# Patient Record
Sex: Male | Born: 1945 | Race: White | Hispanic: No | Marital: Married | Smoking: Former smoker
Health system: Southern US, Community
[De-identification: ages and names within clinical notes are randomized; demographics above are authoritative.]

## PROBLEM LIST (undated history)

## (undated) DIAGNOSIS — I10 Essential (primary) hypertension: Secondary | ICD-10-CM

## (undated) DIAGNOSIS — Z77098 Contact with and (suspected) exposure to other hazardous, chiefly nonmedicinal, chemicals: Secondary | ICD-10-CM

## (undated) DIAGNOSIS — K219 Gastro-esophageal reflux disease without esophagitis: Secondary | ICD-10-CM

## (undated) DIAGNOSIS — I82409 Acute embolism and thrombosis of unspecified deep veins of unspecified lower extremity: Secondary | ICD-10-CM

## (undated) DIAGNOSIS — I2510 Atherosclerotic heart disease of native coronary artery without angina pectoris: Secondary | ICD-10-CM

## (undated) DIAGNOSIS — Z860100 Personal history of colon polyps, unspecified: Secondary | ICD-10-CM

## (undated) DIAGNOSIS — K5720 Diverticulitis of large intestine with perforation and abscess without bleeding: Secondary | ICD-10-CM

## (undated) DIAGNOSIS — IMO0002 Reserved for concepts with insufficient information to code with codable children: Secondary | ICD-10-CM

## (undated) DIAGNOSIS — G4730 Sleep apnea, unspecified: Secondary | ICD-10-CM

## (undated) DIAGNOSIS — H409 Unspecified glaucoma: Secondary | ICD-10-CM

## (undated) DIAGNOSIS — M1990 Unspecified osteoarthritis, unspecified site: Secondary | ICD-10-CM

## (undated) DIAGNOSIS — S83232A Complex tear of medial meniscus, current injury, left knee, initial encounter: Secondary | ICD-10-CM

## (undated) DIAGNOSIS — I219 Acute myocardial infarction, unspecified: Secondary | ICD-10-CM

## (undated) DIAGNOSIS — K651 Peritoneal abscess: Secondary | ICD-10-CM

## (undated) DIAGNOSIS — E785 Hyperlipidemia, unspecified: Secondary | ICD-10-CM

## (undated) HISTORY — DX: Essential (primary) hypertension: I10

## (undated) HISTORY — DX: Atherosclerotic heart disease of native coronary artery without angina pectoris: I25.10

## (undated) HISTORY — DX: Unspecified glaucoma: H40.9

## (undated) HISTORY — DX: Acute embolism and thrombosis of unspecified deep veins of unspecified lower extremity: I82.409

## (undated) HISTORY — DX: Hyperlipidemia, unspecified: E78.5

## (undated) HISTORY — DX: Peritoneal abscess: K65.1

## (undated) HISTORY — DX: Acute myocardial infarction, unspecified: I21.9

## (undated) HISTORY — DX: Contact with and (suspected) exposure to other hazardous, chiefly nonmedicinal, chemicals: Z77.098

## (undated) HISTORY — DX: Personal history of colonic polyps: Z86.010

## (undated) HISTORY — DX: Personal history of colon polyps, unspecified: Z86.0100

## (undated) HISTORY — DX: Reserved for concepts with insufficient information to code with codable children: IMO0002

## (undated) HISTORY — DX: Diverticulitis of large intestine with perforation and abscess without bleeding: K57.20

## (undated) HISTORY — DX: Gastro-esophageal reflux disease without esophagitis: K21.9

## (undated) HISTORY — PX: PERCUTANEOUS CORONARY STENT INTERVENTION (PCI-S): SHX6016

---
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
## 1966-10-23 DIAGNOSIS — Z77098 Contact with and (suspected) exposure to other hazardous, chiefly nonmedicinal, chemicals: Secondary | ICD-10-CM

## 1966-10-23 HISTORY — DX: Contact with and (suspected) exposure to other hazardous, chiefly nonmedicinal, chemicals: Z77.098

## 1998-12-03 ENCOUNTER — Ambulatory Visit (HOSPITAL_COMMUNITY): Admission: RE | Admit: 1998-12-03 | Discharge: 1998-12-04 | Payer: Self-pay | Admitting: Cardiovascular Disease

## 1998-12-03 ENCOUNTER — Encounter: Payer: Self-pay | Admitting: Cardiovascular Disease

## 2002-11-01 ENCOUNTER — Ambulatory Visit (HOSPITAL_BASED_OUTPATIENT_CLINIC_OR_DEPARTMENT_OTHER): Admission: RE | Admit: 2002-11-01 | Discharge: 2002-11-01 | Payer: Self-pay | Admitting: Internal Medicine

## 2004-10-23 DIAGNOSIS — H409 Unspecified glaucoma: Secondary | ICD-10-CM

## 2004-10-23 HISTORY — DX: Unspecified glaucoma: H40.9

## 2007-05-07 DIAGNOSIS — G4733 Obstructive sleep apnea (adult) (pediatric): Secondary | ICD-10-CM | POA: Insufficient documentation

## 2007-05-07 DIAGNOSIS — Z9989 Dependence on other enabling machines and devices: Secondary | ICD-10-CM

## 2007-05-07 DIAGNOSIS — I2510 Atherosclerotic heart disease of native coronary artery without angina pectoris: Secondary | ICD-10-CM | POA: Insufficient documentation

## 2007-05-17 ENCOUNTER — Ambulatory Visit: Payer: Self-pay | Admitting: Internal Medicine

## 2007-05-17 LAB — CONVERTED CEMR LAB
ALT: 44 units/L (ref 0–53)
AST: 32 units/L (ref 0–37)
Albumin: 3.7 g/dL (ref 3.5–5.2)
Alkaline Phosphatase: 67 units/L (ref 39–117)
BUN: 15 mg/dL (ref 6–23)
Basophils Absolute: 0 10*3/uL (ref 0.0–0.1)
Basophils Relative: 0.6 % (ref 0.0–1.0)
Bilirubin Urine: NEGATIVE
Bilirubin, Direct: 0.1 mg/dL (ref 0.0–0.3)
Blood in Urine, dipstick: NEGATIVE
CO2: 29 meq/L (ref 19–32)
Calcium: 9.3 mg/dL (ref 8.4–10.5)
Chloride: 107 meq/L (ref 96–112)
Cholesterol: 126 mg/dL (ref 0–200)
Creatinine, Ser: 1 mg/dL (ref 0.4–1.5)
Direct LDL: 55.4 mg/dL
Eosinophils Absolute: 0.4 10*3/uL (ref 0.0–0.6)
Eosinophils Relative: 5.5 % — ABNORMAL HIGH (ref 0.0–5.0)
GFR calc Af Amer: 98 mL/min
GFR calc non Af Amer: 81 mL/min
Glucose, Bld: 145 mg/dL — ABNORMAL HIGH (ref 70–99)
Glucose, Urine, Semiquant: NEGATIVE
HCT: 38.3 % — ABNORMAL LOW (ref 39.0–52.0)
HDL: 20.1 mg/dL — ABNORMAL LOW (ref >39.0)
Hemoglobin: 13.6 g/dL (ref 13.0–17.0)
Ketones, urine, test strip: NEGATIVE
Lymphocytes Relative: 24.8 % (ref 12.0–46.0)
MCHC: 35.4 g/dL (ref 30.0–36.0)
MCV: 92.5 fL (ref 78.0–100.0)
Monocytes Absolute: 0.5 10*3/uL (ref 0.2–0.7)
Monocytes Relative: 8.4 % (ref 3.0–11.0)
Neutro Abs: 3.9 10*3/uL (ref 1.4–7.7)
Neutrophils Relative %: 60.7 % (ref 43.0–77.0)
Nitrite: NEGATIVE
PSA: 0.53 ng/mL (ref 0.10–4.00)
Platelets: 163 10*3/uL (ref 150–400)
Potassium: 4 meq/L (ref 3.5–5.1)
Protein, U semiquant: NEGATIVE
RBC: 4.14 M/uL — ABNORMAL LOW (ref 4.22–5.81)
RDW: 12.5 % (ref 11.5–14.6)
Sodium: 141 meq/L (ref 135–145)
Specific Gravity, Urine: 1.02
TSH: 0.95 microintl units/mL (ref 0.35–5.50)
Total Bilirubin: 0.6 mg/dL (ref 0.3–1.2)
Total CHOL/HDL Ratio: 6.3
Total Protein: 6.3 g/dL (ref 6.0–8.3)
Triglycerides: 241 mg/dL (ref 0–149)
Urobilinogen, UA: 0.2
VLDL: 48 mg/dL — ABNORMAL HIGH (ref 0–40)
WBC Urine, dipstick: NEGATIVE
WBC: 6.4 10*3/uL (ref 4.5–10.5)
pH: 7

## 2007-07-05 ENCOUNTER — Ambulatory Visit: Payer: Self-pay | Admitting: Internal Medicine

## 2007-07-05 DIAGNOSIS — M25559 Pain in unspecified hip: Secondary | ICD-10-CM | POA: Insufficient documentation

## 2007-07-05 DIAGNOSIS — E118 Type 2 diabetes mellitus with unspecified complications: Secondary | ICD-10-CM

## 2007-07-05 DIAGNOSIS — E1169 Type 2 diabetes mellitus with other specified complication: Secondary | ICD-10-CM | POA: Insufficient documentation

## 2007-07-05 DIAGNOSIS — E1165 Type 2 diabetes mellitus with hyperglycemia: Secondary | ICD-10-CM

## 2007-07-08 ENCOUNTER — Ambulatory Visit: Payer: Self-pay | Admitting: Internal Medicine

## 2007-07-16 ENCOUNTER — Telehealth (INDEPENDENT_AMBULATORY_CARE_PROVIDER_SITE_OTHER): Payer: Self-pay | Admitting: *Deleted

## 2007-07-18 DIAGNOSIS — IMO0002 Reserved for concepts with insufficient information to code with codable children: Secondary | ICD-10-CM

## 2007-07-18 HISTORY — DX: Reserved for concepts with insufficient information to code with codable children: IMO0002

## 2007-07-19 ENCOUNTER — Ambulatory Visit: Payer: Self-pay | Admitting: Internal Medicine

## 2007-07-24 ENCOUNTER — Encounter: Payer: Self-pay | Admitting: Internal Medicine

## 2007-07-30 ENCOUNTER — Telehealth: Payer: Self-pay | Admitting: *Deleted

## 2007-08-09 ENCOUNTER — Encounter: Payer: Self-pay | Admitting: Internal Medicine

## 2007-08-13 ENCOUNTER — Telehealth: Payer: Self-pay | Admitting: *Deleted

## 2007-08-14 ENCOUNTER — Telehealth (INDEPENDENT_AMBULATORY_CARE_PROVIDER_SITE_OTHER): Payer: Self-pay | Admitting: *Deleted

## 2007-09-04 ENCOUNTER — Ambulatory Visit: Payer: Self-pay | Admitting: Internal Medicine

## 2007-09-17 ENCOUNTER — Telehealth: Payer: Self-pay | Admitting: Internal Medicine

## 2007-12-27 ENCOUNTER — Ambulatory Visit: Payer: Self-pay | Admitting: Internal Medicine

## 2007-12-27 LAB — CONVERTED CEMR LAB
CO2: 29 meq/L (ref 19–32)
Calcium: 9.8 mg/dL (ref 8.4–10.5)
Chloride: 108 meq/L (ref 96–112)
Creatinine, Ser: 1 mg/dL (ref 0.4–1.5)
Glucose, Bld: 86 mg/dL (ref 70–99)
Hgb A1c MFr Bld: 5.9 % (ref 4.6–6.0)
Microalb Creat Ratio: 4.6 mg/g (ref 0.0–30.0)
Microalb, Ur: 0.2 mg/dL (ref 0.0–1.9)

## 2008-04-17 ENCOUNTER — Ambulatory Visit: Payer: Self-pay | Admitting: Internal Medicine

## 2008-04-17 LAB — CONVERTED CEMR LAB
CO2: 30 meq/L (ref 19–32)
Calcium: 9.8 mg/dL (ref 8.4–10.5)
Chloride: 106 meq/L (ref 96–112)
Creatinine, Ser: 1.1 mg/dL (ref 0.4–1.5)
Glucose, Bld: 124 mg/dL — ABNORMAL HIGH (ref 70–99)
HDL: 32.1 mg/dL — ABNORMAL LOW (ref >39.0)
PSA: 0.46 ng/mL (ref 0.10–4.00)
Triglycerides: 130 mg/dL (ref 0–149)

## 2008-05-01 ENCOUNTER — Encounter: Payer: Self-pay | Admitting: Internal Medicine

## 2008-05-01 ENCOUNTER — Ambulatory Visit: Payer: Self-pay

## 2008-06-15 ENCOUNTER — Telehealth (INDEPENDENT_AMBULATORY_CARE_PROVIDER_SITE_OTHER): Payer: Self-pay | Admitting: *Deleted

## 2008-08-10 ENCOUNTER — Inpatient Hospital Stay (HOSPITAL_COMMUNITY): Admission: EM | Admit: 2008-08-10 | Discharge: 2008-08-12 | Payer: Self-pay | Admitting: Emergency Medicine

## 2008-08-11 HISTORY — PX: CARDIAC CATHETERIZATION: SHX172

## 2008-08-20 HISTORY — PX: DOPPLER ECHOCARDIOGRAPHY: SHX263

## 2008-09-07 ENCOUNTER — Encounter: Payer: Self-pay | Admitting: Internal Medicine

## 2008-10-02 ENCOUNTER — Ambulatory Visit: Payer: Self-pay | Admitting: Internal Medicine

## 2008-12-16 ENCOUNTER — Encounter: Payer: Self-pay | Admitting: Internal Medicine

## 2009-01-01 ENCOUNTER — Ambulatory Visit: Payer: Self-pay | Admitting: Internal Medicine

## 2009-01-01 LAB — CONVERTED CEMR LAB
Cholesterol: 159 mg/dL (ref 0–200)
Direct LDL: 79.3 mg/dL
HDL: 28.1 mg/dL — ABNORMAL LOW (ref >39.0)

## 2009-01-12 ENCOUNTER — Encounter: Payer: Self-pay | Admitting: Internal Medicine

## 2009-05-02 ENCOUNTER — Emergency Department (HOSPITAL_COMMUNITY): Admission: EM | Admit: 2009-05-02 | Discharge: 2009-05-02 | Payer: Self-pay | Admitting: Family Medicine

## 2009-07-09 ENCOUNTER — Ambulatory Visit: Payer: Self-pay | Admitting: Internal Medicine

## 2009-07-21 LAB — CONVERTED CEMR LAB
BUN: 16 mg/dL (ref 6–23)
CO2: 28 meq/L (ref 19–32)
Calcium: 9.6 mg/dL (ref 8.4–10.5)
Chloride: 109 meq/L (ref 96–112)
Cholesterol: 152 mg/dL (ref 0–200)
Creatinine, Ser: 1 mg/dL (ref 0.4–1.5)
Direct LDL: 70 mg/dL
GFR calc non Af Amer: 79.98 mL/min (ref >60)
Glucose, Bld: 138 mg/dL — ABNORMAL HIGH (ref 70–99)
HDL: 30.4 mg/dL — ABNORMAL LOW (ref >39.00)
Hgb A1c MFr Bld: 6.2 % (ref 4.6–6.5)
Potassium: 4.4 meq/L (ref 3.5–5.1)
Sodium: 144 meq/L (ref 135–145)

## 2009-08-24 LAB — HM DIABETES FOOT EXAM

## 2009-09-24 ENCOUNTER — Encounter: Payer: Self-pay | Admitting: Internal Medicine

## 2009-09-29 ENCOUNTER — Encounter (INDEPENDENT_AMBULATORY_CARE_PROVIDER_SITE_OTHER): Payer: Self-pay | Admitting: *Deleted

## 2009-10-08 ENCOUNTER — Ambulatory Visit: Payer: Self-pay | Admitting: Internal Medicine

## 2009-10-08 DIAGNOSIS — L299 Pruritus, unspecified: Secondary | ICD-10-CM | POA: Insufficient documentation

## 2009-10-08 LAB — CONVERTED CEMR LAB
BUN: 10 mg/dL (ref 6–23)
CO2: 30 meq/L (ref 19–32)
Calcium: 9.5 mg/dL (ref 8.4–10.5)
Chloride: 105 meq/L (ref 96–112)
Creatinine, Ser: 1.1 mg/dL (ref 0.4–1.5)
Glucose, Bld: 136 mg/dL — ABNORMAL HIGH (ref 70–99)

## 2010-02-23 ENCOUNTER — Ambulatory Visit: Payer: Self-pay | Admitting: Internal Medicine

## 2010-02-23 LAB — CONVERTED CEMR LAB
AST: 30 units/L (ref 0–37)
BUN: 11 mg/dL (ref 6–23)
Basophils Absolute: 0.1 10*3/uL (ref 0.0–0.1)
CO2: 29 meq/L (ref 19–32)
Calcium: 9.4 mg/dL (ref 8.4–10.5)
Cholesterol: 139 mg/dL (ref 0–200)
Creatinine, Ser: 0.9 mg/dL (ref 0.4–1.5)
GFR calc non Af Amer: 85.72 mL/min (ref >60)
Glucose, Bld: 146 mg/dL — ABNORMAL HIGH (ref 70–99)
HCT: 40.4 % (ref 39.0–52.0)
Hemoglobin: 14 g/dL (ref 13.0–17.0)
Lymphs Abs: 1.7 10*3/uL (ref 0.7–4.0)
MCHC: 34.7 g/dL (ref 30.0–36.0)
MCV: 96.1 fL (ref 78.0–100.0)
Monocytes Absolute: 0.2 10*3/uL (ref 0.1–1.0)
Monocytes Relative: 2.9 % — ABNORMAL LOW (ref 3.0–12.0)
Neutro Abs: 6.4 10*3/uL (ref 1.4–7.7)
Platelets: 132 10*3/uL — ABNORMAL LOW (ref 150.0–400.0)
RDW: 12.9 % (ref 11.5–14.6)
Sodium: 141 meq/L (ref 135–145)
Total Bilirubin: 0.5 mg/dL (ref 0.3–1.2)
Total Protein: 7.4 g/dL (ref 6.0–8.3)

## 2010-04-29 ENCOUNTER — Encounter: Payer: Self-pay | Admitting: Internal Medicine

## 2010-06-24 ENCOUNTER — Ambulatory Visit: Payer: Self-pay | Admitting: Internal Medicine

## 2010-06-24 DIAGNOSIS — L2089 Other atopic dermatitis: Secondary | ICD-10-CM | POA: Insufficient documentation

## 2010-06-24 LAB — CONVERTED CEMR LAB
Cholesterol: 129 mg/dL
HDL: 35 mg/dL
LDL Cholesterol: 54 mg/dL

## 2010-07-11 ENCOUNTER — Encounter: Payer: Self-pay | Admitting: Internal Medicine

## 2010-07-29 ENCOUNTER — Ambulatory Visit: Payer: Self-pay | Admitting: Internal Medicine

## 2010-11-20 LAB — CONVERTED CEMR LAB: Hgb A1c MFr Bld: 6.6 % — ABNORMAL HIGH (ref 4.6–6.0)

## 2010-11-24 NOTE — Assessment & Plan Note (Signed)
Summary: follow up/med check/diabetes meds/cjr   Vital Signs:  Patient profile:   65 year old male Height:      69 inches Weight:      246 pounds BMI:     36.46 Temp:     98.6 degrees F oral Pulse rate:   72 / minute Resp:     14 per minute BP sitting:   124 / 80  (left arm)  Vitals Entered By: Willy Eddy, LPN (Feb 23, 1609 3:52 PM) CC: roa, Hypertension Management   CC:  roa and Hypertension Management.  History of Present Illness: The A1C  has been stable and the pt has been folow the appropriate diet still smoking pt and wife both still smoke   Hypertension History:      He denies headache, chest pain, palpitations, dyspnea with exertion, orthopnea, PND, peripheral edema, visual symptoms, neurologic problems, syncope, and side effects from treatment.        Positive major cardiovascular risk factors include male age 63 years old or older, diabetes, hyperlipidemia, hypertension, and current tobacco user.        Positive history for target organ damage include ASHD (either angina/prior MI/prior CABG).     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) 3)  Lumbar Radiculopathy  (ICD-724.4) 4)  Preventive Health Care  (ICD-V70.0) 5)  Hip Pain, Bilateral  (ICD-719.45) 6)  Hyperlipidemia  (ICD-272.4) 7)  Diabetes Mellitus, Type II  (ICD-250.00) 8)  Sleep Apnea, Chronic  (ICD-780.57) 9)  Myocardial Infarction, Hx of  (ICD-412) 10)  Hypertension  (ICD-401.9) 11)  Coronary Artery Disease  (ICD-414.00)  Current Problems (verified): 1)  Pruritus  (ICD-698.9) 2)  Hyperlipidemia, 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 Once Daily 2)  Simvastatin 40 Mg  Tabs (Simvastatin) .... Once Daily 3)  Adult Aspirin Low Strength 81 Mg  Tbdp (Aspirin) .... Once Daily 4)  C-Pap Machine 5)  Janumet 50-500 Mg  Tabs (Sitagliptin-Metformin Hcl) .... One By Mouth Daily 6)  Freestyle Lite   Strp (Glucose Blood) .... Use Tobg's As Direced  ( Two Times A Day) 7)  Plavix 75 Mg Tabs (Clopidogrel Bisulfate) .Marland Kitchen.. 1 Once Daily 8)  Lisinopril 10 Mg Tabs (Lisinopril) .Marland Kitchen.. 1 Once Daily 9)  Brimonidine Tartrate 0.2 % Soln (Brimonidine Tartrate) .... Two Times A Day 10)  Rid Lice Killing Mousse 4-0.33 % Foam (Pyrethrins-Piperonyl Butoxide) .... Aplly As Directed Overnight 11)  Fexofenadine Hcl 180 Mg Tabs (Fexofenadine Hcl) .... One By Mouth Daily Prn 12)  Betamethasone Valerate 0.1 % Lotn (Betamethasone Valerate) .... Mix Betamethasone and Sarna Lotions 1 Part Betamethasone To 3 Parts Sarna Lotion  and Apply To Skin Two Times A Day As Needed  Current Medications (verified): 1)  Atenolol 50 Mg  Tabs (Atenolol) .... Once Daily 2)  Simvastatin 40 Mg  Tabs (Simvastatin) .... Once Daily 3)  Adult Aspirin Low Strength 81 Mg  Tbdp (Aspirin) .Marland KitchenMarland KitchenMarland Kitchen  Once Daily 4)  C-Pap Machine 5)  Janumet 50-500 Mg  Tabs (Sitagliptin-Metformin Hcl) .... One By Mouth Daily 6)  Freestyle Lite   Strp (Glucose Blood) .... Use Tobg's As Direced  ( Two Times A Day) 7)  Plavix 75 Mg Tabs (Clopidogrel Bisulfate) .Marland Kitchen.. 1 Once Daily 8)  Lisinopril 10 Mg Tabs (Lisinopril) .Marland Kitchen.. 1 Once Daily 9)  Fexofenadine Hcl 180 Mg Tabs (Fexofenadine Hcl) .... One By Mouth Daily Prn 10)  Betamethasone Valerate 0.1 % Lotn (Betamethasone Valerate) .... Mix Betamethasone and Sarna Lotions 1 Part Betamethasone To 3 Parts Sarna Lotion  and Apply To Skin Two Times A Day As  Needed  Allergies (verified): No Known Drug Allergies  Past History:  Family History: Last updated: 07/19/2007 Family History Hypertension  Social History: Last updated: 09/04/2007 Married Former Smoker Alcohol use-yes Drug use-no Regular exercise-yes  Risk Factors: Exercise: yes (09/04/2007)  Risk Factors: Smoking Status: current (02/23/2010) Packs/Day: 1.0 (02/23/2010)  Past medical, surgical, family and social histories (including risk factors) reviewed, and no changes noted (except as noted below).  Past Medical History: Reviewed history from 07/05/2007 and no changes required. Coronary artery disease Hypertension Myocardial infarction, hx of Diabetes mellitus, type II Hyperlipidemia  Past Surgical History: Reviewed history from 07/19/2007 and no changes required. stents x two for one vessle dz Tresa Endo) Appendectomy  Family History: Reviewed history from 07/19/2007 and no changes required. Family History Hypertension  Social History: Reviewed history from 09/04/2007 and no changes required. Married Former Smoker Alcohol use-yes Drug use-no Regular exercise-yes  Review of Systems  The patient denies anorexia, fever, weight loss, weight gain, vision loss, decreased hearing, hoarseness, chest pain, syncope, dyspnea on exertion, peripheral edema, prolonged cough, headaches, hemoptysis, abdominal pain, melena, hematochezia, severe indigestion/heartburn, hematuria, incontinence, genital sores, muscle weakness, suspicious skin lesions, transient blindness, difficulty walking, depression, unusual weight change, abnormal bleeding, enlarged lymph nodes, angioedema, and breast masses.    Physical Exam  General:  Well-developed,well-nourished,in no acute distress; alert,appropriate and cooperative throughout examination Head:  normocephalic and male-pattern balding.   Nose:  no external deformity and no nasal discharge.   Neck:  No deformities, masses, or  tenderness noted. Lungs:  normal respiratory effort and no wheezes.   Heart:  normal rate and regular rhythm.   Abdomen:  Bowel sounds positive,abdomen soft and non-tender without masses, organomegaly or hernias noted. Msk:  No deformity or scoliosis noted of thoracic or lumbar spine.   Extremities:  No clubbing, cyanosis, edema, or deformity noted with normal full range of motion of all joints.   Neurologic:  No cranial nerve deficits noted. Station and gait are normal. Plantar reflexes are down-going bilaterally. DTRs are symmetrical throughout. Sensory, motor and coordinative functions appear intact.   Impression & Recommendations:  Problem # 1:  HYPERLIPIDEMIA, ATHEROGENIC (ICD-272.4) Assessment Unchanged  His updated medication list for this problem includes:    Simvastatin 40 Mg Tabs (Simvastatin) ..... Once daily  Labs Reviewed: SGOT: 32 (05/17/2007)   SGPT: 44 (05/17/2007)  Lipid Goals: Chol Goal: 200 (12/27/2007)   HDL Goal: 40 (12/27/2007)   LDL Goal: 70 (12/27/2007)   TG Goal: 150 (12/27/2007)  Prior 10 Yr Risk Heart Disease: N/A (12/27/2007)   HDL:30.40 (07/09/2009), 28.1 (01/01/2009)  LDL:75 (04/17/2008), DEL (47/82/9562)  Chol:152 (07/09/2009), 159 (01/01/2009)  Trig:130 (04/17/2008), 241 (05/17/2007)  Orders: TLB-Cholesterol, HDL (83718-HDL) TLB-Cholesterol, Direct LDL (83721-DIRLDL) TLB-Cholesterol, Total (82465-CHO)  Problem # 2:  HYPERTENSION (ICD-401.9) Assessment: Unchanged  His updated medication list for this problem includes:  Atenolol 50 Mg Tabs (Atenolol) ..... Once daily    Lisinopril 10 Mg Tabs (Lisinopril) .Marland Kitchen... 1 once daily  Orders: Venipuncture (16109) TLB-BMP (Basic Metabolic Panel-BMET) (80048-METABOL)  BP today: 124/80 Prior BP: 100/68 (10/08/2009)  Prior 10 Yr Risk Heart Disease: N/A (12/27/2007)  Labs Reviewed: K+: 4.4 (10/08/2009) Creat: : 1.1 (10/08/2009)   Chol: 152 (07/09/2009)   HDL: 30.40 (07/09/2009)   LDL: 75 (04/17/2008)    TG: 130 (04/17/2008)  Problem # 3:  CORONARY ARTERY DISEASE (ICD-414.00)  His updated medication list for this problem includes:    Atenolol 50 Mg Tabs (Atenolol) ..... Once daily    Adult Aspirin Low Strength 81 Mg Tbdp (Aspirin) ..... Once daily    Plavix 75 Mg Tabs (Clopidogrel bisulfate) .Marland Kitchen... 1 once daily    Lisinopril 10 Mg Tabs (Lisinopril) .Marland Kitchen... 1 once daily  Orders: TLB-CBC Platelet - w/Differential (85025-CBCD)  Labs Reviewed: Chol: 152 (07/09/2009)   HDL: 30.40 (07/09/2009)   LDL: 75 (04/17/2008)   TG: 130 (04/17/2008)  Lipid Goals: Chol Goal: 200 (12/27/2007)   HDL Goal: 40 (12/27/2007)   LDL Goal: 70 (12/27/2007)   TG Goal: 150 (12/27/2007)  Problem # 4:  PRURITUS (ICD-698.9)  Orders: TLB-Hepatic/Liver Function Pnl (80076-HEPATIC)  Complete Medication List: 1)  Atenolol 50 Mg Tabs (Atenolol) .... Once daily 2)  Simvastatin 40 Mg Tabs (Simvastatin) .... Once daily 3)  Adult Aspirin Low Strength 81 Mg Tbdp (Aspirin) .... Once daily 4)  C-pap Machine  5)  Janumet 50-500 Mg Tabs (Sitagliptin-metformin hcl) .... One by mouth daily 6)  Freestyle Lite Strp (Glucose blood) .... Use tobg's as direced  ( two times a day) 7)  Plavix 75 Mg Tabs (Clopidogrel bisulfate) .Marland Kitchen.. 1 once daily 8)  Lisinopril 10 Mg Tabs (Lisinopril) .Marland Kitchen.. 1 once daily 9)  Fexofenadine Hcl 180 Mg Tabs (Fexofenadine hcl) .... One by mouth daily prn 10)  Betamethasone Valerate 0.1 % Lotn (Betamethasone valerate) .... Mix betamethasone and sarna lotions 1 part betamethasone to 3 parts sarna lotion  and apply to skin two times a day as needed 11)  Clobetasol Propionate 0.05 % Foam (Clobetasol propionate) .... Aplly to site daily  Hypertension Assessment/Plan:      The patient's hypertensive risk group is category C: Target organ damage and/or diabetes.  Today's blood pressure is 124/80.  His blood pressure goal is < 130/80.  Patient Instructions: 1)  Please schedule a follow-up appointment in 3  months. Prescriptions: CLOBETASOL PROPIONATE 0.05 % FOAM (CLOBETASOL PROPIONATE) aplly to site daily  #1 can x 3   Entered and Authorized by:   Stacie Glaze MD   Signed by:   Stacie Glaze MD on 02/23/2010   Method used:   Electronically to        [facility] (retail)       [address]       [place], [place]  [address]       Ph: [phone] or [phone]       Fax: [phone]   RxID:   [id number]

## 2010-11-24 NOTE — Assessment & Plan Note (Signed)
Summary: 3 mo rov/mm rsc bmp/njr   Vital Signs:  Patient profile:   65 year old male Height:      69 inches Weight:      245 pounds BMI:     36.31 Temp:     98.2 degrees F oral Pulse rate:   68 / minute Resp:     14 per minute BP sitting:   124 / 76  (left arm)  Vitals Entered By: Willy Eddy, LPN (June 24, 2010 9:10 AM) CC: roa, Hypertension Management Is Patient Diabetic? Yes Did you bring your meter with you today? No   Primary Care Provider:  Stacie Glaze MD  CC:  roa and Hypertension Management.  History of Present Illness: The lesion are in the waste area with bx of allergic reaction and possible scabies wife treated for scabes  as well  blood work from [facility] included lipids? CBC and BMET  the A1C was 6.3 we will stop the lisinopril to see if this is a medication reaction   Hypertension History:      He denies headache, chest pain, palpitations, dyspnea with exertion, orthopnea, PND, peripheral edema, visual symptoms, neurologic problems, syncope, and side effects from treatment.        Positive major cardiovascular risk factors include male age 66 years old or older, diabetes, hyperlipidemia, hypertension, and current tobacco user.        Positive history for target organ damage include ASHD (either angina/prior MI/prior CABG).     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) 4)  Lumbar Radiculopathy  (ICD-724.4) 5)  Preventive Health Care  (ICD-V70.0) 6)  Hip Pain, Bilateral  (ICD-719.45) 7)  Hyperlipidemia  (ICD-272.4) 8)  Diabetes Mellitus, Type II  (ICD-250.00) 9)  Sleep Apnea, Chronic  (ICD-780.57) 10)  Myocardial Infarction, Hx of  (ICD-412) 11)  Hypertension  (ICD-401.9) 12)  Coronary Artery Disease  (ICD-414.00)  Current Problems  (verified): 1)  Pruritus  (ICD-698.9) 2)  Pruritus  (ICD-698.9) 3)  Hyperlipidemia, 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 Once Daily 2)  Simvastatin 40 Mg  Tabs (Simvastatin) .... Once Daily 3)  Adult Aspirin Low Strength 81 Mg  Tbdp (Aspirin) .... Once Daily 4)  C-Pap Machine 5)  Janumet 50-500 Mg  Tabs (Sitagliptin-Metformin Hcl) .... One By Mouth Daily 6)  Freestyle Lite   Strp (Glucose Blood) .... Use Tobg's As Direced  ( Two Times A Day) 7)  Plavix 75 Mg Tabs (Clopidogrel Bisulfate) .Marland Kitchen.. 1 Once Daily 8)  Lisinopril 10 Mg Tabs (Lisinopril) .Marland Kitchen.. 1 Once Daily 9)  Fexofenadine Hcl 180 Mg Tabs (Fexofenadine Hcl) .... One By Mouth Daily Prn 10)  Betamethasone Valerate 0.1 % Lotn (Betamethasone Valerate) .... Mix Betamethasone and Sarna Lotions 1 Part Betamethasone To 3 Parts Sarna Lotion  and Apply To Skin Two Times A Day As Needed 11)  Clobetasol Propionate 0.05 % Foam (Clobetasol  Propionate) .... Aplly To Site Daily  Current Medications (verified): 1)  Atenolol 50 Mg  Tabs (Atenolol) .... Once Daily 2)  Simvastatin 40 Mg  Tabs (Simvastatin) .... Once Daily 3)  Adult Aspirin Low Strength 81 Mg  Tbdp (Aspirin) .... Once Daily 4)  C-Pap Machine 5)  Janumet 50-500 Mg  Tabs (Sitagliptin-Metformin Hcl) .... One By Mouth Daily 6)  Freestyle Lite   Strp (Glucose Blood) .... Use Tobg's As Direced  ( Two Times A Day) 7)  Plavix 75 Mg Tabs (Clopidogrel Bisulfate) .Marland Kitchen.. 1 Once Daily 8)  Fexofenadine Hcl 180 Mg Tabs (Fexofenadine Hcl) .... One By Mouth Daily Prn 9)  Methylprednisolone 4 Mg  Tabs (Methylprednisolone) .... One By Mouth Daily  Allergies (verified): No Known Drug Allergies  Past History:  Family History: Last updated: 07/19/2007 Family History Hypertension  Social History: Last updated: 09/04/2007 Married Former Smoker Alcohol use-yes Drug use-no Regular exercise-yes  Risk Factors: Exercise: yes (09/04/2007)  Risk Factors: Smoking Status: current (06/24/2010) Packs/Day: 1.0 (06/24/2010)  Past medical, surgical, family and social histories (including risk factors) reviewed, and no changes noted (except as noted below).  Past Medical History: Reviewed history from 07/05/2007 and no changes required. Coronary artery disease Hypertension Myocardial infarction, hx of Diabetes mellitus, type II Hyperlipidemia  Past Surgical History: Reviewed history from 07/19/2007 and no changes required. stents x two for one vessle dz Tresa Endo) Appendectomy  Family History: Reviewed history from 07/19/2007 and no changes required. Family History Hypertension  Social History: Reviewed history from 09/04/2007 and no changes required. Married Former Smoker Alcohol use-yes Drug use-no Regular exercise-yes  Review of Systems  The patient denies anorexia, fever, weight loss, weight gain, vision loss, decreased hearing, hoarseness, chest pain, syncope, dyspnea on exertion, peripheral edema, prolonged cough, headaches, hemoptysis, abdominal pain, melena, hematochezia, severe indigestion/heartburn, hematuria, incontinence, genital sores, muscle weakness, suspicious skin lesions, transient blindness, difficulty walking, depression, unusual weight change, abnormal bleeding, enlarged lymph nodes, angioedema, and breast masses.    Physical Exam  General:  Well-developed,well-nourished,in no acute distress; alert,appropriate and cooperative throughout examination Head:  normocephalic and male-pattern balding.   Nose:  no external deformity and no nasal discharge.    Neck:  No deformities, masses, or tenderness noted. Lungs:  normal respiratory effort and no wheezes.   Heart:  normal rate and regular rhythm.   Abdomen:  Bowel sounds positive,abdomen soft and non-tender without masses, organomegaly or hernias noted. Msk:  No deformity or scoliosis noted of thoracic or lumbar spine.   Extremities:  No clubbing, cyanosis, edema, or deformity noted with normal full range of motion of all joints.   Neurologic:  No cranial nerve deficits noted. Station and gait are normal. Plantar reflexes are down-going bilaterally. DTRs are symmetrical throughout. Sensory, motor and coordinative functions appear intact.   Impression & Recommendations:  Problem # 1:  DIABETES MELLITUS, TYPE II (ICD-250.00) Assessment Unchanged  The following medications were removed from the medication list:    Lisinopril 10 Mg Tabs (Lisinopril) .Marland Kitchen... 1 once daily His updated medication list for this problem includes:    Adult Aspirin Low Strength 81 Mg Tbdp (Aspirin) ..... Once daily    Janumet 50-500 Mg Tabs (Sitagliptin-metformin hcl) ..... One by mouth daily  Labs Reviewed: Creat: 0.9 (02/23/2010)     Last Eye Exam: normal (08/24/2009) Reviewed HgBA1c results: 6.3 (10/08/2009)  6.2 (07/09/2009)  Problem # 2:  PRURITUS (ICD-698.9) Assessment: Deteriorated the pt has noted new lesions ( that started 2 years ago) biopsy " allergic  reaction" did blood work and gave cortisone cream treated  for scabes treated with permethrin %%  and predisone does pack the symptoms stoped temporary  but then resumed  Problem # 3:  HYPERLIPIDEMIA, ATHEROGENIC (ICD-272.4)  His updated medication list for this problem includes:    Simvastatin 40 Mg Tabs (Simvastatin) ..... Once daily  Labs Reviewed: SGOT: 30 (02/23/2010)   SGPT: 30 (02/23/2010)  Lipid Goals: Chol Goal: 200 (12/27/2007)   HDL Goal: 40 (12/27/2007)   LDL Goal: 70 (12/27/2007)   TG Goal: 150 (12/27/2007)  Prior 10 Yr Risk  Heart Disease: N/A (12/27/2007)   HDL:37.20 (02/23/2010), 30.40 (07/09/2009)  LDL:75 (04/17/2008), DEL (04/54/0981)  Chol:139 (02/23/2010), 152 (07/09/2009)  Trig:130 (04/17/2008), 241 (05/17/2007)  Problem # 4:  DIABETES MELLITUS, TYPE II (ICD-250.00)  The following medications were removed from the medication list:    Lisinopril 10 Mg Tabs (Lisinopril) .Marland Kitchen... 1 once daily His updated medication list for this problem includes:    Adult Aspirin Low Strength 81 Mg Tbdp (Aspirin) ..... Once daily    Janumet 50-500 Mg Tabs (Sitagliptin-metformin hcl) ..... One by mouth daily  Complete Medication List: 1)  Atenolol 50 Mg Tabs (Atenolol) .... Once daily 2)  Simvastatin 40 Mg Tabs (Simvastatin) .... Once daily 3)  Adult Aspirin Low Strength 81 Mg Tbdp (Aspirin) .... Once daily 4)  C-pap Machine  5)  Janumet 50-500 Mg Tabs (Sitagliptin-metformin hcl) .... One by mouth daily 6)  Freestyle Lite Strp (Glucose blood) .... Use tobg's as direced  ( two times a day) 7)  Plavix 75 Mg Tabs (Clopidogrel bisulfate) .Marland Kitchen.. 1 once daily 8)  Fexofenadine Hcl 180 Mg Tabs (Fexofenadine hcl) .... One by mouth daily prn 9)  Methylprednisolone 4 Mg Tabs (Methylprednisolone) .... One by mouth daily 10)  Permethrin 5 % Crea (Permethrin) .... Apply as directed and repoeat in 7 days  Other Orders: Venipuncture ([address]147) T-Allergy Profile [facility], [place], [place], [place], [place] [phone]) Specimen Handling (62130)  Hypertension Assessment/Plan:      The patient's hypertensive risk group is category C: Target organ damage and/or diabetes.  Today's blood pressure is 124/76.  His blood pressure goal is < 130/80.  Patient Instructions: 1)  Please schedule a follow-up appointment in 1 month. 2)  moniter the blood pressure and call if the blood pressure is greater than 140/90 Prescriptions: METHYLPREDNISOLONE 4 MG TABS (METHYLPREDNISOLONE) one by mouth daily  #30 x 0   Entered and Authorized by:   Stacie Glaze MD   Signed by:   Stacie Glaze MD on 06/24/2010   Method used:   Electronically to        [facility] (retail)       [address]       [place], [place]  [address]       Ph: [phone]       Fax: [phone]   RxID:   [id number] PERMETHRIN 5 % CREA (PERMETHRIN) apply as directed and repoeat in 7 days  #1 tube x 1   Entered and Authorized by:   Stacie Glaze MD   Signed by:   Stacie Glaze MD on 06/24/2010   Method used:   Electronically to        [facility] [address] (retail)       [address]       [place]  [place], [place]  [address]       Ph: [phone]       Fax: [phone]   RxID:   [id number] METHYLPREDNISOLONE 4 MG TABS (METHYLPREDNISOLONE) one by mouth daily  #30 x 0   Entered and Authorized by:   Stacie Glaze MD   Signed by:   Stacie Glaze MD on 06/24/2010   Method used:   Print then Give to Patient   RxID:   [id number]

## 2010-11-24 NOTE — Letter (Signed)
Summary: [facility]   Imported By: Maryln Gottron 07/15/2010 15:55:49  _____________________________________________________________________  External Attachment:    Type:   Image     Comment:   External Document

## 2010-11-24 NOTE — Assessment & Plan Note (Signed)
Summary: 1 month rov/njr   Vital Signs:  Patient profile:   65 year old male Height:      69 inches Weight:      244 pounds BMI:     36.16 Temp:     98.2 degrees F oral Pulse rate:   68 / minute Resp:     14 per minute BP sitting:   130 / 74  (left arm)  Vitals Entered By: Willy Eddy, LPN (July 29, 2010 11:42 AM) CC: roa, Cough Is Patient Diabetic? Yes Did you bring your meter with you today? No   Primary Care Jannifer Fischler:  Stacie Glaze MD  CC:  roa and Cough.  History of Present Illness:  Cough      This is a 65 year old man who presents with Cough.  The patient reports productive cough and wheezing, but denies non-productive cough, pleuritic chest pain, shortness of breath, exertional dyspnea, fever, hemoptysis, and malaise.  Associated symtpoms include nasal congestion.  Ineffective prior treatments have included OTC cough medication.  Risk factors include history of allergic rhinitis.    Hyperlipidemia Follow-Up      The patient also presents for Hyperlipidemia follow-up.  The patient denies muscle aches, GI upset, abdominal pain, flushing, itching, constipation, diarrhea, and fatigue.  The patient denies the following symptoms: chest pain/pressure, exercise intolerance, dypsnea, palpitations, syncope, and pedal edema.  Compliance with medications (by patient report) has been near 100%.  Dietary compliance has been good.  The patient reports exercising occasionally.  Adjunctive measures currently used by the patient include fish oil supplements and weight reduction.    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) 4)  Lumbar Radiculopathy  (ICD-724.4) 5)  Preventive Health Care   (ICD-V70.0) 6)  Hip Pain, Bilateral  (ICD-719.45) 7)  Hyperlipidemia  (ICD-272.4) 8)  Diabetes Mellitus, Type II  (ICD-250.00) 9)  Sleep Apnea, Chronic  (ICD-780.57) 10)  Myocardial Infarction, Hx of  (ICD-412) 11)  Hypertension  (ICD-401.9) 12)  Coronary Artery Disease  (ICD-414.00)  Current Problems (verified): 1)  Other Atopic Dermatitis and Related Conditions  (ICD-691.8) 2)  Pruritus  (ICD-698.9) 3)  Hyperlipidemia, 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 Once Daily 2)  Simvastatin 40 Mg  Tabs (Simvastatin) .... Once Daily 3)  Adult Aspirin Low Strength 81 Mg  Tbdp (Aspirin) .... Once Daily 4)  C-Pap Machine 5)  Janumet 50-500 Mg  Tabs (Sitagliptin-Metformin Hcl) .... One By Mouth Daily 6)  Freestyle Lite   Strp (Glucose Blood) .... Use Tobg's As Direced  ( Two Times A Day) 7)  Plavix 75 Mg Tabs (Clopidogrel Bisulfate) .Marland Kitchen.. 1 Once Daily 8)  Fexofenadine Hcl 180 Mg Tabs (Fexofenadine Hcl) .... One By Mouth Daily Prn 9)  Methylprednisolone 4 Mg Tabs (Methylprednisolone) .... One By Mouth  Daily 10)  Permethrin 5 % Crea (Permethrin) .... Apply As Directed and Repoeat in 7 Days  Current Medications (verified): 1)  Atenolol 50 Mg  Tabs (Atenolol) .... Once Daily 2)  Simvastatin 40 Mg  Tabs (Simvastatin) .... Once Daily 3)  Adult Aspirin Low Strength 81 Mg  Tbdp (Aspirin) .... Once Daily 4)  C-Pap Machine 5)  Janumet 50-500 Mg  Tabs (Sitagliptin-Metformin Hcl) .... One By Mouth Daily 6)  Freestyle Lite   Strp (Glucose Blood) .... Use Tobg's As Direced  ( Two Times A Day) 7)   Plavix 75 Mg Tabs (Clopidogrel Bisulfate) .Marland Kitchen.. 1 Once Daily 8)  Fexofenadine Hcl 180 Mg Tabs (Fexofenadine Hcl) .... One By Mouth Daily Prn 9)  Methylprednisolone 4 Mg Tabs (Methylprednisolone) .... One By Mouth Daily 10)  Permethrin 5 % Crea (Permethrin) .... Apply As Directed and Repoeat in 7 Days 11)  Hydromet 5-1.5 Mg/37ml Syrp (Hydrocodone-Homatropine) .... Two Tsp By Mouth Two Times A Day 12)  Levaquin 500 Mg Tabs (Levofloxacin) .... One By Mouth Daily For 7 Days  Allergies (verified): No Known Drug Allergies  Past History:  Family History: Last updated: 07/19/2007 Family History Hypertension  Social History: Last updated: 09/04/2007 Married Former Smoker Alcohol use-yes Drug use-no Regular exercise-yes  Risk Factors: Exercise: yes (09/04/2007)  Risk Factors: Smoking Status: current (07/29/2010) Packs/Day: 1.0 (07/29/2010)  Past medical, surgical, family and social histories (including risk factors) reviewed, and no changes noted (except as noted below).  Past Medical History: Reviewed history from 07/05/2007 and no changes required. Coronary artery disease Hypertension Myocardial infarction, hx of Diabetes mellitus, type II Hyperlipidemia  Past Surgical History: Reviewed history from 07/19/2007 and no changes required. stents x two for one vessle dz Tresa Endo) Appendectomy  Family History: Reviewed history from 07/19/2007 and no changes required. Family History Hypertension  Social History: Reviewed history from 09/04/2007 and no changes required. Married Former Smoker Alcohol use-yes Drug use-no Regular exercise-yes  Review of Systems  The patient denies anorexia, fever, weight loss, weight gain, vision loss, decreased hearing, hoarseness, chest pain, syncope, dyspnea on exertion, peripheral edema, prolonged cough, headaches, hemoptysis, abdominal pain, melena, hematochezia, severe indigestion/heartburn, hematuria, incontinence, genital sores, muscle  weakness, suspicious skin lesions, transient blindness, difficulty walking, depression, unusual weight change, abnormal bleeding, enlarged lymph nodes, angioedema, and breast masses.    Physical Exam  General:  Well-developed,well-nourished,in no acute distress; alert,appropriate and cooperative throughout examination Head:  normocephalic and male-pattern balding.   Nose:  no external deformity and no nasal discharge.   Lungs:  normal respiratory effort and no wheezes.   Heart:  normal rate and regular rhythm.   Abdomen:  Bowel sounds positive,abdomen soft and non-tender without masses, organomegaly or hernias noted.   Impression & Recommendations:  Problem # 1:  HYPERLIPIDEMIA, ATHEROGENIC (ICD-272.4)  His updated medication list for this problem includes:    Simvastatin 40 Mg Tabs (Simvastatin) ..... Once daily  Labs Reviewed: SGOT: 30 (02/23/2010)   SGPT: 30 (02/23/2010)  Lipid Goals: Chol Goal: 200 (12/27/2007)   HDL Goal: 40 (12/27/2007)   LDL Goal: 70 (12/27/2007)   TG Goal: 150 (12/27/2007)  Prior 10 Yr Risk Heart Disease: N/A (12/27/2007)   HDL:35 (06/24/2010), 37.20 (02/23/2010)  LDL:54 (06/24/2010), 75 (04/17/2008)  Chol:129 (06/24/2010), 139 (02/23/2010)  Trig:130 (04/17/2008), 241 (05/17/2007)  Problem # 2:  DIABETES MELLITUS, TYPE II (ICD-250.00)  His updated medication list for this problem includes:    Adult Aspirin Low Strength 81 Mg Tbdp (Aspirin) ..... Once daily  Janumet 50-500 Mg Tabs (Sitagliptin-metformin hcl) ..... One by mouth daily  Labs Reviewed: Creat: 0.9 (02/23/2010)     Last Eye Exam: normal (08/24/2009) Reviewed HgBA1c results: 6.3 (06/24/2010)  6.3 (10/08/2009)  Orders: Venipuncture (16109) TLB-A1C / Hgb A1C (Glycohemoglobin) (83036-A1C)  Problem # 3:  HYPERTENSION (ICD-401.9)  His updated medication list for this problem includes:    Atenolol 50 Mg Tabs (Atenolol) ..... Once daily  BP today: 130/74 Prior BP: 124/76  (06/24/2010)  Prior 10 Yr Risk Heart Disease: N/A (12/27/2007)  Labs Reviewed: K+: 4.3 (02/23/2010) Creat: : 0.9 (02/23/2010)   Chol: 129 (06/24/2010)   HDL: 35 (06/24/2010)   LDL: 54 (06/24/2010)   TG: 130 (04/17/2008)  Problem # 4:  SLEEP APNEA, CHRONIC (ICD-780.57) Assessment: Unchanged  Problem # 5:  ACUTE BRONCHITIS (ICD-466.0)  Take antibiotics and other medications as directed. Encouraged to push clear liquids, get enough rest, and take acetaminophen as needed. To be seen in 5-7 days if no improvement, sooner if worse.  His updated medication list for this problem includes:    Hydromet 5-1.5 Mg/36ml Syrp (Hydrocodone-homatropine) .Marland Kitchen..Marland Kitchen Two tsp by mouth two times a day    Levaquin 500 Mg Tabs (Levofloxacin) ..... One by mouth daily for 7 days  Complete Medication List: 1)  Atenolol 50 Mg Tabs (Atenolol) .... Once daily 2)  Simvastatin 40 Mg Tabs (Simvastatin) .... Once daily 3)  Adult Aspirin Low Strength 81 Mg Tbdp (Aspirin) .... Once daily 4)  C-pap Machine  5)  Janumet 50-500 Mg Tabs (Sitagliptin-metformin hcl) .... One by mouth daily 6)  Freestyle Lite Strp (Glucose blood) .... Use tobg's as direced  ( two times a day) 7)  Plavix 75 Mg Tabs (Clopidogrel bisulfate) .Marland Kitchen.. 1 once daily 8)  Fexofenadine Hcl 180 Mg Tabs (Fexofenadine hcl) .... One by mouth daily prn 9)  Methylprednisolone 4 Mg Tabs (Methylprednisolone) .... One by mouth daily 10)  Permethrin 5 % Crea (Permethrin) .... Apply as directed and repoeat in 7 days 11)  Hydromet 5-1.5 Mg/14ml Syrp (Hydrocodone-homatropine) .... Two tsp by mouth two times a day 12)  Levaquin 500 Mg Tabs (Levofloxacin) .... One by mouth daily for 7 days  Patient Instructions: 1)  The pt need a welcome to medicare exam in MAY Prescriptions: LEVAQUIN 500 MG TABS (LEVOFLOXACIN) one by mouth daily for 7 days  #7 x 0   Entered and Authorized by:   Stacie Glaze MD   Signed by:   Stacie Glaze MD on 07/29/2010   Method used:   Print then  Give to Patient   RxID:   [id number] HYDROMET 5-1.5 MG/5ML SYRP (HYDROCODONE-HOMATROPINE) two tsp by mouth two times a day  #6 oz x 0   Entered and Authorized by:   Stacie Glaze MD   Signed by:   Stacie Glaze MD on 07/29/2010   Method used:   Print then Give to Patient   RxID:   [id number] PERMETHRIN 5 % CREA (PERMETHRIN) apply as directed and repoeat in 7 days  #1 tube x 1   Entered and Authorized by:   Stacie Glaze MD   Signed by:   Stacie Glaze MD on 07/29/2010   Method used:   Electronically to        [facility]  [address] (retail)       [address], [address]       [place], [place]  [address]  Ph: [phone]       Fax: [phone]   RxID:   [id number]   Appended Document: Orders Update     Clinical Lists Changes  Orders: Added new Service order of Specimen Handling ([id number]) - Signed

## 2010-11-30 ENCOUNTER — Telehealth: Payer: Self-pay | Admitting: Internal Medicine

## 2010-11-30 DIAGNOSIS — J329 Chronic sinusitis, unspecified: Secondary | ICD-10-CM

## 2010-11-30 MED ORDER — AZITHROMYCIN 250 MG PO TABS: ORAL_TABLET | ORAL | Status: AC

## 2010-11-30 NOTE — Telephone Encounter (Signed)
Pt requesting antibioitic for sinusitis that he has had for greater than  1 week- no relief with otc med

## 2010-11-30 NOTE — Telephone Encounter (Signed)
Pt is req to pick up copy of office notes and lab results from his last office visit. Pt will pick up when ready.

## 2011-02-22 ENCOUNTER — Encounter: Payer: Self-pay | Admitting: Internal Medicine

## 2011-02-28 ENCOUNTER — Encounter: Payer: Self-pay | Admitting: Internal Medicine

## 2011-02-28 ENCOUNTER — Ambulatory Visit (INDEPENDENT_AMBULATORY_CARE_PROVIDER_SITE_OTHER): Payer: BC Managed Care – PPO | Admitting: Internal Medicine

## 2011-02-28 VITALS — BP 122/80 | HR 68 | Temp 98.2°F | Resp 16 | Ht 69.0 in | Wt 246.0 lb

## 2011-02-28 DIAGNOSIS — I10 Essential (primary) hypertension: Secondary | ICD-10-CM

## 2011-02-28 DIAGNOSIS — F411 Generalized anxiety disorder: Secondary | ICD-10-CM

## 2011-02-28 DIAGNOSIS — E119 Type 2 diabetes mellitus without complications: Secondary | ICD-10-CM

## 2011-02-28 DIAGNOSIS — Z125 Encounter for screening for malignant neoplasm of prostate: Secondary | ICD-10-CM

## 2011-02-28 DIAGNOSIS — I2510 Atherosclerotic heart disease of native coronary artery without angina pectoris: Secondary | ICD-10-CM

## 2011-02-28 DIAGNOSIS — Z0000 Encounter for general adult medical examination without abnormal findings: Secondary | ICD-10-CM

## 2011-02-28 LAB — CBC WITH DIFFERENTIAL/PLATELET
Basophils Relative: 0.4 % (ref 0.0–3.0)
Eosinophils Relative: 1.9 % (ref 0.0–5.0)
MCV: 96.9 fl (ref 78.0–100.0)
Monocytes Absolute: 0.6 10*3/uL (ref 0.1–1.0)
Neutrophils Relative %: 63.8 % (ref 43.0–77.0)
RBC: 4.46 Mil/uL (ref 4.22–5.81)
WBC: 6 10*3/uL (ref 4.5–10.5)

## 2011-02-28 LAB — POCT URINALYSIS DIPSTICK
Ketones, UA: NEGATIVE
Leukocytes, UA: NEGATIVE
Nitrite, UA: NEGATIVE
Protein, UA: NEGATIVE
pH, UA: 7

## 2011-02-28 LAB — HEPATIC FUNCTION PANEL
ALT: 32 U/L (ref 0–53)
AST: 26 U/L (ref 0–37)
Albumin: 4.5 g/dL (ref 3.5–5.2)
Alkaline Phosphatase: 58 U/L (ref 39–117)
Total Protein: 7.1 g/dL (ref 6.0–8.3)

## 2011-02-28 LAB — BASIC METABOLIC PANEL
BUN: 12 mg/dL (ref 6–23)
CO2: 28 mEq/L (ref 19–32)
Calcium: 9.6 mg/dL (ref 8.4–10.5)
Creatinine, Ser: 1 mg/dL (ref 0.4–1.5)

## 2011-02-28 LAB — LIPID PANEL: VLDL: 50.8 mg/dL — ABNORMAL HIGH (ref 0.0–40.0)

## 2011-02-28 LAB — PSA: PSA: 0.47 ng/mL (ref 0.10–4.00)

## 2011-02-28 NOTE — Progress Notes (Signed)
  Subjective:    Patient ID: Evan Avery, male    DOB: [birth year], 65 y.o.   MRN: [id number]  HPI Patient is a 65 year old white male who presents for his a complete physical. He also has an acute stress reaction do to the death of his mother the pending death of his mother-in-law and one son being arrested and placed in jail and the other son having paranoid schizophrenia with a decompensation flair and loss of his job.  He is unable to work to the distress and have written him out of work for 30 days.  His blood pressure surprisingly well controlled during this episode he has been compliant with his medications for both hypertension and diabetes measure appropriate laboratory values along with his physical labs.     Review of Systems  Constitutional: Negative for fever and fatigue.  HENT: Negative for hearing loss, congestion, neck pain and postnasal drip.   Eyes: Negative for discharge, redness and visual disturbance.  Respiratory: Negative for cough, shortness of breath and wheezing.   Cardiovascular: Negative for leg swelling.  Gastrointestinal: Negative for abdominal pain, constipation and abdominal distention.  Genitourinary: Negative for urgency and frequency.  Musculoskeletal: Negative for joint swelling and arthralgias.  Skin: Negative for color change and rash.  Neurological: Negative for weakness and light-headedness.  Hematological: Negative for adenopathy.  Psychiatric/Behavioral: Negative for behavioral problems. The patient is nervous/anxious and is hyperactive.    Past Medical History  Diagnosis Date  . CAD (coronary artery disease)   . Hypertension   . Myocardial infarction   . Diabetes mellitus   . Hyperlipidemia    Past Surgical History  Procedure Date  . Stents 2 times for vessel disease-kelly   . Appendectomy     reports that he has been smoking.  He does not have any smokeless tobacco history on file. He reports that he drinks alcohol. He  reports that he does not use illicit drugs. family history includes Alzheimer's disease in his mother. No Known Allergies     Objective:   Physical Exam  Nursing note and vitals reviewed. Constitutional: He appears well-developed and well-nourished.  HENT:  Head: Normocephalic and atraumatic.  Eyes: Conjunctivae are normal. Pupils are equal, round, and reactive to light.  Neck: Normal range of motion. Neck supple.  Cardiovascular: Normal rate and regular rhythm.   Pulmonary/Chest: Effort normal and breath sounds normal.  Abdominal: Soft. Bowel sounds are normal.  Psychiatric: He is agitated, aggressive and is hyperactive.          Assessment & Plan:   Patient presents for yearly preventative medicine examination.   all immunizations and health maintenance protocols were reviewed with the patient and they are up to date with these protocols.   screening laboratory values were reviewed with the patient including screening of hyperlipidemia PSA renal function and hepatic function.   There medications past medical history social history problem list and allergies were reviewed in detail.   Goals were established with regard to weight loss exercise diet in compliance with medications I have given him a work excuse for 30 days during which time he will pursue counseling for his stress and anxiety.  His blood pressure is stable his diabetes is stable he'll continue all his current medications we'll monitor appropriate labs I

## 2011-03-07 NOTE — Cardiovascular Report (Signed)
Evan Avery, Evan Avery               ACCOUNT NO.:  [id number]   MEDICAL RECORD NO.:  [id number]          PATIENT TYPE:  INP   LOCATION:  6527                         FACILITY:  [facility]   PHYSICIAN:  Madaline Savage, M.D.DATE OF BIRTH:  Dec 13, 1945   DATE OF PROCEDURE:  08/11/2008  DATE OF DISCHARGE:                            CARDIAC CATHETERIZATION   PROCEDURES PERFORMED:  1. Selective coronary angiography by Judkins technique.  2. Left heart catheterization.  3. Left ventricular angiography.  4. Elective stenting of the mid-right coronary artery.   COMPLICATIONS:  None.   ENTRY SITE:  Right femoral.   DYE USED:  Omnipaque.   MEDICATIONS GIVEN:  1. Plavix 600 mg by mouth.  2. Pepcid 20 mg by mouth.  3. Also, Angiomax infusion during acute coronary intervention that was      discontinued after intervention was completed.   COMPLICATIONS:  None.   PATIENT PROFILE:  Evan Avery is a 65 year old gentleman who is a  Cardiology patient of Dr. Nicki Guadalajara, who entered [facility]  on around August 10, 2008, with chest discomfort like his heart attack  by description.  The patient has a history of angioplasty at age 65,  which would be virtually 19 years ago and has had stents at age 65 and  65.  The patient currently has been experiencing chest pain for 2 weeks.  He entered [facility] on August 10, 2008.  His EKG showed  sinus bradycardia with inferior lead T-wave inversions II and aVF and  biphasic T-wave abnormalities in V4 through V6 and inferolateral  distribution.  The patient was brought to the cath lab today for cardiac  catheterization and possible percutaneous intervention.  His peak  troponin was 1.17.  The case was performed via the right percutaneous  femoral approach using 5-French Judkins catheters for diagnostic cardiac  catheterization.  We then upsized the indwelling right femoral arterial  sheath to a 6-French system and used a Cordis XB RCA  guide catheter to  perform the percutaneous intervention.  We used an ATW marker wire to  provide guidewire backup for the stent that was deployed into the mid  RCA.  There was noted to be patent stents proximally just after  pulmonary conus branch and just before acute marginal branch and then a  third stent in the more distal portion of the mid RCA.  There was a 95-  99% stenosis of the mid RCA just beyond the acute marginal branch that  was felt to be the culprit lesion.  The distal RCA was very large,  dominant, and only showed luminal irregularities.   Left ventricular angiogram showed normal contractility wall segments  with an EF of 60%.  The left anterior descending circumflex and obtuse  marginal branches of the circumflex as well as the diagonal branch of  the LAD were all patent.   LV angiogram showed an LVEF of 60% with no mitral regurgitation.   The intervention performed on this vessel involved direct stenting of  the lesion, which was located just beyond the acute marginal branch of  the RCA.  I used a 3.5 x 28-mm Liberte stent in a direct fashion without  predilatation and after dilatation of the stent balloon, the result  looked much improved.  Post dilatation of that segment in the mid RCA  was accomplished with a 4.0 DuraStar to 18 atmospheres of pressure and  making sure that the overlapping edges proximally and distally to the  new DuraStar stent were widely stretched.   The patient had chest pain during the post dilatation portion of the  intervention, which was treated with fentanyl and Versed.  It subsided  within 15 minutes of the administration of the analgesic.   The patient had no hemodynamic complications.  His right groin did well  and he was pain free at the time of exit from the cath lab.   FINAL IMPRESSIONS:  1. Angiographically patent left main left anterior descending      diagonal, circumflex, and multiple circumflex obtuse marginal       branches from the left coronary system.  2. History of percutaneous stenting to what appears to be 3 different      spots in the right coronary artery previously.  3. Successful de novo stenting of the mid right coronary artery today      with deployment of a 3.5 x 28-mm Liberte non drug-eluting stent      postdilated with a 4.0 DuraStar stent.   PLAN:  The patient will be recovered in the holding area and go to the  6500 units for overnight observation and may be a candidate for  discharge tomorrow if his enzymes do not bump any further.           ______________________________  Madaline Savage, M.D.     [facility]/MEDQ  D:  08/11/2008  T:  08/11/2008  Job:  [id number]   cc:   Catheterization Laboratory [facility] [place]

## 2011-03-07 NOTE — Discharge Summary (Signed)
Evan Avery, SPIELMANN NO.:  [id number]   MEDICAL RECORD NO.:  [id number]          PATIENT TYPE:  INP   LOCATION:  6527                         FACILITY:  [facility]   PHYSICIAN:  Nicki Guadalajara, M.D.     DATE OF BIRTH:  Jan 10, 1946   DATE OF ADMISSION:  08/10/2008  DATE OF DISCHARGE:  08/12/2008                               DISCHARGE SUMMARY   DISCHARGE DIAGNOSES:  1. Small subendocardial myocardial infarction by troponin with a peak      troponin of 1.17 with a negative CK-MB.  2. Coronary artery disease, status post new right coronary artery      stent this admission.  3. Non-insulin-dependent diabetes.  4. Treated hypertension.  5. Treated dyslipidemia with the addition of Niaspan this admission.  6. Sleep apnea, on CPAP.  7. Prior right coronary artery stenting.   HOSPITAL COURSE:  The patient is a 65 year old male followed by Dr.  Tresa Endo.  He has a history of coronary disease and had a PTCA at age 46.  He presented July 11, 2008, with chest pain consistent with  unstable angina.  He was admitted to telemetry after being seen by Dr.  Tresa Endo.  Troponins came back positive with peak of 1.17.  The patient was  on heparin and nitrates.  CK-MBs were negative.  Catheterization  July 12, 2008, showed normal LAD and diagonal, normal left main,  and normal circumflex.  RCA had three stents, one proximal, one mid  proximal, and one distal.  Between the mid proximal stent and the distal  stent was a new area of 90% narrowing.  Dr. Elsie Lincoln placed a Liberte  stent with good results.  We feel the patient will be discharged on  August 12, 2008.  He will follow up with Dr. Tresa Endo as an outpatient.  We have held his Janumet minute until August 14, 2008.   DISCHARGE MEDICATIONS:  1. Atenolol 50 mg a day.  2. Norvasc 10 mg a day.  3. Zocor 40 mg a day.  4. Aspirin 81 mg a day.  5. Janumet 50/500 daily, we will start August 14, 2008.  6. __________ ophthalmic drops.  7. Altace 5 mg a day has been added.  8. Niaspan 500 mg at bedtime.  9. Plavix 75 mg a day.  10.Pepcid AC 20 mg a day.   LABS:  At discharge, white count 6.9, hemoglobin 13.3, hematocrit 38.8,  and platelets 100.  Sodium 138, potassium 4.0, BUN 12, creatinine 0.9.  Lipids show cholesterol 144, LDL 81, HDL 34, triglycerides 146.  EKG  shows sinus rhythm without acute changes.   DISPOSITION:  The patient is discharged in stable condition and will  follow up with Dr. Tresa Endo.  We should note that chest x-ray showed  cardiomegaly and some pulmonary venous hypertension.      Abelino Derrick, P.A.    ______________________________  Nicki Guadalajara, M.D.    [name]  D:  08/12/2008  T:  08/12/2008  Job:  [id number]   cc:   Nicki Guadalajara, M.D.  Stacie Glaze, MD

## 2011-03-30 ENCOUNTER — Encounter: Payer: Self-pay | Admitting: *Deleted

## 2011-06-16 HISTORY — PX: CARDIOVASCULAR STRESS TEST: SHX262

## 2011-06-27 ENCOUNTER — Telehealth: Payer: Self-pay | Admitting: Speech Pathology

## 2011-06-27 NOTE — Telephone Encounter (Signed)
Dr. Ladona Ridgel (a Psychologist for [facility]) is asking for a few minutes of your time to discuss this patients Disability.  He wanted me to tell you that he will answer this phone personally.

## 2011-07-13 ENCOUNTER — Telehealth: Payer: Self-pay | Admitting: Internal Medicine

## 2011-07-13 NOTE — Telephone Encounter (Signed)
Pt insurance company faxed over form for medical leave. Pt needs it filled out so that the insurance will cover the leave. Pt said it needs to be turned in by the 24th or insurance will decline the coverage. Please contact pt when complete

## 2011-07-14 NOTE — Telephone Encounter (Signed)
To dr jenkins 

## 2011-07-17 NOTE — Telephone Encounter (Signed)
HAVE YOU SEEN THIS FORM?

## 2011-07-25 LAB — CBC
HCT: 38.8 — ABNORMAL LOW
MCHC: 34.1
MCV: 95.6
MCV: 96.2
Platelets: 119 — ABNORMAL LOW
RBC: 4.02 — ABNORMAL LOW
RBC: 4.03 — ABNORMAL LOW
RDW: 12.8
RDW: 13
WBC: 6.2
WBC: 6.9

## 2011-07-25 LAB — DIFFERENTIAL
Eosinophils Relative: 4
Lymphocytes Relative: 27
Lymphs Abs: 1.7
Monocytes Absolute: 0.6
Monocytes Relative: 10
Neutro Abs: 3.6

## 2011-07-25 LAB — BASIC METABOLIC PANEL
CO2: 25
Chloride: 103
Chloride: 109
Creatinine, Ser: 0.93
GFR calc Af Amer: >60
GFR calc Af Amer: >60
Glucose, Bld: 118 — ABNORMAL HIGH
Potassium: 4

## 2011-07-25 LAB — TROPONIN I: Troponin I: 0.24 — ABNORMAL HIGH

## 2011-07-25 LAB — CK TOTAL AND CKMB (NOT AT ARMC): Relative Index: 3.3 — ABNORMAL HIGH

## 2011-07-25 LAB — LIPID PANEL
Cholesterol: 144
Cholesterol: 146
HDL: 35 — ABNORMAL LOW
LDL Cholesterol: 81
LDL Cholesterol: 99
Total CHOL/HDL Ratio: 4.2
Triglycerides: 146
Triglycerides: 62

## 2011-07-25 LAB — COMPREHENSIVE METABOLIC PANEL
AST: 24
Albumin: 4
Calcium: 9.7
Chloride: 109
Creatinine, Ser: 0.94
GFR calc Af Amer: >60
Total Protein: 6.4

## 2011-07-25 LAB — HEMOGLOBIN A1C: Mean Plasma Glucose: 131

## 2011-07-25 LAB — GLUCOSE, CAPILLARY
Glucose-Capillary: 109 — ABNORMAL HIGH
Glucose-Capillary: 114 — ABNORMAL HIGH
Glucose-Capillary: 127 — ABNORMAL HIGH
Glucose-Capillary: 208 — ABNORMAL HIGH
Glucose-Capillary: 95

## 2011-07-25 LAB — CARDIAC PANEL(CRET KIN+CKTOT+MB+TROPI)
CK, MB: 4.8 — ABNORMAL HIGH
CK, MB: 6.4 — ABNORMAL HIGH
Relative Index: 3.7 — ABNORMAL HIGH
Relative Index: 4 — ABNORMAL HIGH
Troponin I: 0.92

## 2011-07-25 LAB — LIPASE, BLOOD: Lipase: 29

## 2011-07-25 LAB — HEPARIN LEVEL (UNFRACTIONATED): Heparin Unfractionated: 0.37

## 2011-07-25 LAB — PROTIME-INR: Prothrombin Time: 13.5

## 2011-08-14 ENCOUNTER — Telehealth: Payer: Self-pay | Admitting: *Deleted

## 2011-08-14 NOTE — Telephone Encounter (Signed)
Request for Janumet samples until 09/01/11 office visit. Samples ready at front counter. Patient informed.

## 2011-09-01 ENCOUNTER — Encounter: Payer: Self-pay | Admitting: Internal Medicine

## 2011-09-01 ENCOUNTER — Ambulatory Visit (INDEPENDENT_AMBULATORY_CARE_PROVIDER_SITE_OTHER): Payer: BC Managed Care – PPO | Admitting: Internal Medicine

## 2011-09-01 DIAGNOSIS — J019 Acute sinusitis, unspecified: Secondary | ICD-10-CM

## 2011-09-01 DIAGNOSIS — E119 Type 2 diabetes mellitus without complications: Secondary | ICD-10-CM

## 2011-09-01 DIAGNOSIS — E785 Hyperlipidemia, unspecified: Secondary | ICD-10-CM

## 2011-09-01 DIAGNOSIS — I10 Essential (primary) hypertension: Secondary | ICD-10-CM

## 2011-09-01 LAB — BASIC METABOLIC PANEL
BUN: 13 mg/dL (ref 6–23)
CO2: 26 mEq/L (ref 19–32)
Chloride: 106 mEq/L (ref 96–112)
Creatinine, Ser: 1 mg/dL (ref 0.4–1.5)

## 2011-09-01 MED ORDER — INOSITOL NIACINATE 500 MG PO CAPS: 1.0000 | ORAL_CAPSULE | Freq: Every day | ORAL | Status: DC

## 2011-09-01 MED ORDER — LEVOFLOXACIN 500 MG PO TABS: 500.0000 mg | ORAL_TABLET | Freq: Every day | ORAL | Status: DC

## 2011-09-01 MED ORDER — LEVOFLOXACIN 500 MG PO TABS: 500.0000 mg | ORAL_TABLET | Freq: Every day | ORAL | Status: AC

## 2011-09-01 NOTE — Progress Notes (Signed)
  Subjective:    Patient ID: Evan Avery, male    DOB: 03-23-1946, 65 y.o.   MRN: [id number]  Diabetes Pertinent negatives for diabetes include no fatigue and no weakness.  Hypertension Pertinent negatives include no neck pain or shortness of breath.  Hyperlipidemia Pertinent negatives include no shortness of breath.   today he has  acute sinusitis symptoms Patient is 65 year old white male with a history of coronary artery disease seen by cardiology who is also followed for diabetes hypertension and  Depression. Recently he was taken out of work for depression because the psychiatrist him in that he needs stabilization of his medications and he is placed on Zoloft.  States it is doing better on the medication has followup with psychiatrist  States his blood sugars have been within range but he did not bring his glucometer today he has been following a diet although his weight remains unchanged.  He recently saw the cardiologist who did stress testing increased his beta blocker and his ACE inhibitor but did not adjust his statin  Review of Systems  Constitutional: Negative for fever and fatigue.  HENT: Positive for congestion, rhinorrhea and postnasal drip. Negative for hearing loss and neck pain.   Eyes: Negative for discharge, redness and visual disturbance.  Respiratory: Negative for cough, shortness of breath and wheezing.   Cardiovascular: Negative for leg swelling.  Gastrointestinal: Negative for abdominal pain, constipation and abdominal distention.  Genitourinary: Negative for urgency and frequency.  Musculoskeletal: Negative for joint swelling and arthralgias.  Skin: Negative for color change and rash.  Neurological: Negative for weakness and light-headedness.  Hematological: Negative for adenopathy.  Psychiatric/Behavioral: Negative for behavioral problems.   Past Medical History  Diagnosis Date  . CAD (coronary artery disease)   . Hypertension   . Myocardial  infarction   . Diabetes mellitus   . Hyperlipidemia    Past Surgical History  Procedure Date  . Stents 2 times for vessel disease-kelly   . Appendectomy     reports that he has been smoking.  He does not have any smokeless tobacco history on file. He reports that he drinks alcohol. He reports that he does not use illicit drugs. family history includes Alzheimer's disease in his mother. No Known Allergies     Objective:   Physical Exam  Nursing note and vitals reviewed. Constitutional: He appears well-developed and well-nourished.  HENT:  Head: Normocephalic and atraumatic.       Turbinates full with green-yellow discharge oropharynx cobblestoning  Eyes: Conjunctivae are normal. Pupils are equal, round, and reactive to light.  Neck: Normal range of motion. Neck supple.  Cardiovascular: Normal rate and regular rhythm.   Pulmonary/Chest: Effort normal and breath sounds normal.  Abdominal: Soft. Bowel sounds are normal.          Assessment & Plan:  Acute sinusitis will be treated with Levaquin 500 milligrams by mouth daily for 10 days.  He is stable his blood pressure medications after the change per his cardiologist Will measure basic metabolic panel to assess renal function.  His diabetes appears to be stable for an A1c will be drawn today again we reemphasized weight loss and exercise.

## 2011-09-01 NOTE — Patient Instructions (Signed)
The patient is instructed to continue all medications as prescribed. Schedule followup with check out clerk upon leaving the clinic  

## 2011-10-02 ENCOUNTER — Ambulatory Visit (HOSPITAL_BASED_OUTPATIENT_CLINIC_OR_DEPARTMENT_OTHER): Payer: BC Managed Care – PPO

## 2011-10-02 ENCOUNTER — Other Ambulatory Visit (HOSPITAL_BASED_OUTPATIENT_CLINIC_OR_DEPARTMENT_OTHER): Payer: Self-pay | Admitting: Family Medicine

## 2011-10-11 ENCOUNTER — Encounter (HOSPITAL_COMMUNITY): Payer: Self-pay | Admitting: Emergency Medicine

## 2011-10-11 ENCOUNTER — Emergency Department (HOSPITAL_COMMUNITY): Payer: BC Managed Care – PPO

## 2011-10-11 ENCOUNTER — Other Ambulatory Visit: Payer: Self-pay

## 2011-10-11 ENCOUNTER — Inpatient Hospital Stay (HOSPITAL_COMMUNITY)
Admission: EM | Admit: 2011-10-11 | Discharge: 2011-10-13 | DRG: 416 | Disposition: A | Discharge status: Home / Self Care | Payer: BC Managed Care – PPO | Attending: Internal Medicine | Admitting: Internal Medicine

## 2011-10-11 DIAGNOSIS — E785 Hyperlipidemia, unspecified: Secondary | ICD-10-CM | POA: Diagnosis present

## 2011-10-11 DIAGNOSIS — Z7982 Long term (current) use of aspirin: Secondary | ICD-10-CM

## 2011-10-11 DIAGNOSIS — E119 Type 2 diabetes mellitus without complications: Secondary | ICD-10-CM | POA: Diagnosis present

## 2011-10-11 DIAGNOSIS — E1165 Type 2 diabetes mellitus with hyperglycemia: Secondary | ICD-10-CM | POA: Diagnosis present

## 2011-10-11 DIAGNOSIS — D696 Thrombocytopenia, unspecified: Secondary | ICD-10-CM | POA: Diagnosis present

## 2011-10-11 DIAGNOSIS — R6510 Systemic inflammatory response syndrome (SIRS) of non-infectious origin without acute organ dysfunction: Principal | ICD-10-CM | POA: Diagnosis present

## 2011-10-11 DIAGNOSIS — I10 Essential (primary) hypertension: Secondary | ICD-10-CM | POA: Diagnosis present

## 2011-10-11 DIAGNOSIS — F17200 Nicotine dependence, unspecified, uncomplicated: Secondary | ICD-10-CM | POA: Diagnosis present

## 2011-10-11 DIAGNOSIS — I2510 Atherosclerotic heart disease of native coronary artery without angina pectoris: Secondary | ICD-10-CM | POA: Diagnosis present

## 2011-10-11 DIAGNOSIS — I252 Old myocardial infarction: Secondary | ICD-10-CM

## 2011-10-11 DIAGNOSIS — A419 Sepsis, unspecified organism: Secondary | ICD-10-CM | POA: Diagnosis present

## 2011-10-11 DIAGNOSIS — Z6838 Body mass index (BMI) 38.0-38.9, adult: Secondary | ICD-10-CM

## 2011-10-11 DIAGNOSIS — E1169 Type 2 diabetes mellitus with other specified complication: Secondary | ICD-10-CM | POA: Diagnosis present

## 2011-10-11 DIAGNOSIS — I959 Hypotension, unspecified: Secondary | ICD-10-CM | POA: Diagnosis present

## 2011-10-11 LAB — POCT I-STAT, CHEM 8
BUN: 14 mg/dL (ref 6–23)
Chloride: 108 mEq/L (ref 96–112)
Creatinine, Ser: 1 mg/dL (ref 0.50–1.35)
Glucose, Bld: 130 mg/dL — ABNORMAL HIGH (ref 70–99)
Potassium: 3.6 mEq/L (ref 3.5–5.1)
Sodium: 141 mEq/L (ref 135–145)

## 2011-10-11 LAB — CARDIAC PANEL(CRET KIN+CKTOT+MB+TROPI)
CK, MB: 2.4 ng/mL (ref 0.3–4.0)
Total CK: 167 U/L (ref 7–232)

## 2011-10-11 LAB — CBC
HCT: 40.2 % (ref 39.0–52.0)
MCV: 93.9 fL (ref 78.0–100.0)
RDW: 13 % (ref 11.5–15.5)
WBC: 3.8 10*3/uL — ABNORMAL LOW (ref 4.0–10.5)

## 2011-10-11 LAB — URINALYSIS, ROUTINE W REFLEX MICROSCOPIC
Bilirubin Urine: NEGATIVE
Glucose, UA: 500 mg/dL — AB
Hgb urine dipstick: NEGATIVE
Specific Gravity, Urine: 1.024 (ref 1.005–1.030)
pH: 6 (ref 5.0–8.0)

## 2011-10-11 LAB — INFLUENZA PANEL BY PCR (TYPE A & B): Influenza A By PCR: NEGATIVE

## 2011-10-11 LAB — DIFFERENTIAL
Basophils Absolute: 0 10*3/uL (ref 0.0–0.1)
Eosinophils Relative: 2 % (ref 0–5)
Lymphocytes Relative: 12 % (ref 12–46)
Monocytes Absolute: 0 10*3/uL — ABNORMAL LOW (ref 0.1–1.0)
Monocytes Relative: 1 % — ABNORMAL LOW (ref 3–12)

## 2011-10-11 MED ORDER — ALBUTEROL SULFATE (5 MG/ML) 0.5% IN NEBU: 2.5000 mg | INHALATION_SOLUTION | RESPIRATORY_TRACT | Status: DC | PRN

## 2011-10-11 MED ORDER — ACETAMINOPHEN 325 MG PO TABS
650.0000 mg | ORAL_TABLET | Freq: Once | ORAL | Status: AC
  Administered 2011-10-11: 650 mg via ORAL
  Filled 2011-10-11: qty 2

## 2011-10-11 MED ORDER — INSULIN ASPART 100 UNIT/ML ~~LOC~~ SOLN
0.0000 [IU] | Freq: Three times a day (TID) | SUBCUTANEOUS | Status: DC
  Administered 2011-10-11: 2 [IU] via SUBCUTANEOUS
  Administered 2011-10-11: 1 [IU] via SUBCUTANEOUS
  Administered 2011-10-12 (×2): 2 [IU] via SUBCUTANEOUS
  Administered 2011-10-12 – 2011-10-13 (×2): 1 [IU] via SUBCUTANEOUS
  Filled 2011-10-11 (×2): qty 3

## 2011-10-11 MED ORDER — ASPIRIN 81 MG PO CHEW
81.0000 mg | CHEWABLE_TABLET | Freq: Every day | ORAL | Status: DC
  Administered 2011-10-11 – 2011-10-13 (×3): 81 mg via ORAL
  Filled 2011-10-11 (×3): qty 1

## 2011-10-11 MED ORDER — ACETAMINOPHEN 325 MG PO TABS
650.0000 mg | ORAL_TABLET | Freq: Four times a day (QID) | ORAL | Status: DC | PRN
  Administered 2011-10-12: 650 mg via ORAL
  Filled 2011-10-11: qty 2

## 2011-10-11 MED ORDER — SODIUM CHLORIDE 0.9 % IV BOLUS (SEPSIS)
1000.0000 mL | Freq: Once | INTRAVENOUS | Status: AC
  Administered 2011-10-11: 1000 mL via INTRAVENOUS

## 2011-10-11 MED ORDER — ALBUTEROL SULFATE (5 MG/ML) 0.5% IN NEBU
2.5000 mg | INHALATION_SOLUTION | Freq: Four times a day (QID) | RESPIRATORY_TRACT | Status: DC
  Administered 2011-10-11 – 2011-10-12 (×4): 2.5 mg via RESPIRATORY_TRACT
  Filled 2011-10-11 (×4): qty 0.5

## 2011-10-11 MED ORDER — LATANOPROST 0.005 % OP SOLN
1.0000 [drp] | Freq: Every day | OPHTHALMIC | Status: DC
  Administered 2011-10-11 – 2011-10-12 (×2): 1 [drp] via OPHTHALMIC
  Filled 2011-10-11: qty 2.5

## 2011-10-11 MED ORDER — PIPERACILLIN-TAZOBACTAM 3.375 G IVPB
3.3750 g | Freq: Three times a day (TID) | INTRAVENOUS | Status: DC
  Administered 2011-10-11: 3.375 g via INTRAVENOUS
  Filled 2011-10-11 (×3): qty 50

## 2011-10-11 MED ORDER — SODIUM CHLORIDE 0.9 % IV BOLUS (SEPSIS): 1000.0000 mL | Freq: Once | INTRAVENOUS | Status: DC

## 2011-10-11 MED ORDER — SODIUM CHLORIDE 0.9 % IV SOLN
INTRAVENOUS | Status: DC
  Administered 2011-10-11: 20:00:00 via INTRAVENOUS
  Administered 2011-10-11: 1000 mL via INTRAVENOUS

## 2011-10-11 MED ORDER — OSELTAMIVIR PHOSPHATE 75 MG PO CAPS
75.0000 mg | ORAL_CAPSULE | Freq: Two times a day (BID) | ORAL | Status: DC
  Administered 2011-10-11: 75 mg via ORAL
  Filled 2011-10-11 (×2): qty 1

## 2011-10-11 MED ORDER — ACETAMINOPHEN 650 MG RE SUPP: 650.0000 mg | Freq: Four times a day (QID) | RECTAL | Status: DC | PRN

## 2011-10-11 MED ORDER — SODIUM CHLORIDE 0.9 % IV SOLN
Freq: Once | INTRAVENOUS | Status: AC
  Administered 2011-10-11: 02:00:00 via INTRAVENOUS

## 2011-10-11 MED ORDER — OSELTAMIVIR PHOSPHATE 75 MG PO CAPS
75.0000 mg | ORAL_CAPSULE | ORAL | Status: AC
  Administered 2011-10-11: 75 mg via ORAL
  Filled 2011-10-11: qty 1

## 2011-10-11 MED ORDER — LORATADINE 10 MG PO TABS
10.0000 mg | ORAL_TABLET | Freq: Every day | ORAL | Status: DC
  Administered 2011-10-11 – 2011-10-13 (×3): 10 mg via ORAL
  Filled 2011-10-11 (×3): qty 1

## 2011-10-11 MED ORDER — SIMVASTATIN 40 MG PO TABS
40.0000 mg | ORAL_TABLET | Freq: Every day | ORAL | Status: DC
  Administered 2011-10-11 – 2011-10-12 (×2): 40 mg via ORAL
  Filled 2011-10-11 (×4): qty 1

## 2011-10-11 MED ORDER — LEVOFLOXACIN IN D5W 750 MG/150ML IV SOLN
750.0000 mg | INTRAVENOUS | Status: AC
Start: 2011-10-11 — End: 2011-10-11
  Administered 2011-10-11: 750 mg via INTRAVENOUS
  Filled 2011-10-11: qty 150

## 2011-10-11 MED ORDER — SODIUM CHLORIDE 0.9 % IV BOLUS (SEPSIS)
1000.0000 mL | Freq: Once | INTRAVENOUS | Status: AC
  Administered 2011-10-11: 08:00:00 via INTRAVENOUS

## 2011-10-11 MED ORDER — VANCOMYCIN HCL IN DEXTROSE 1-5 GM/200ML-% IV SOLN
1000.0000 mg | Freq: Once | INTRAVENOUS | Status: AC
  Administered 2011-10-11: 1000 mg via INTRAVENOUS
  Filled 2011-10-11: qty 200

## 2011-10-11 MED ORDER — BRIMONIDINE TARTRATE 0.2 % OP SOLN
1.0000 [drp] | Freq: Two times a day (BID) | OPHTHALMIC | Status: DC
  Administered 2011-10-11 – 2011-10-13 (×5): 1 [drp] via OPHTHALMIC
  Filled 2011-10-11 (×2): qty 5

## 2011-10-11 MED ORDER — ONDANSETRON HCL 4 MG/2ML IJ SOLN: 4.0000 mg | Freq: Four times a day (QID) | INTRAMUSCULAR | Status: DC | PRN

## 2011-10-11 MED ORDER — ONDANSETRON HCL 4 MG PO TABS: 4.0000 mg | ORAL_TABLET | Freq: Four times a day (QID) | ORAL | Status: DC | PRN

## 2011-10-11 MED ORDER — LEVOFLOXACIN IN D5W 750 MG/150ML IV SOLN
750.0000 mg | INTRAVENOUS | Status: DC
  Administered 2011-10-12: 750 mg via INTRAVENOUS
  Filled 2011-10-11 (×2): qty 150

## 2011-10-11 MED ORDER — CLOPIDOGREL BISULFATE 75 MG PO TABS
75.0000 mg | ORAL_TABLET | Freq: Every day | ORAL | Status: DC
  Administered 2011-10-11 – 2011-10-13 (×3): 75 mg via ORAL
  Filled 2011-10-11 (×3): qty 1

## 2011-10-11 MED ORDER — VANCOMYCIN HCL IN DEXTROSE 1-5 GM/200ML-% IV SOLN
1000.0000 mg | Freq: Two times a day (BID) | INTRAVENOUS | Status: DC
  Administered 2011-10-11: 1000 mg via INTRAVENOUS
  Filled 2011-10-11 (×2): qty 200

## 2011-10-11 MED ORDER — SODIUM CHLORIDE 0.9 % IV BOLUS (SEPSIS): 2000.0000 mL | Freq: Once | INTRAVENOUS | Status: DC

## 2011-10-11 MED ORDER — PIPERACILLIN-TAZOBACTAM 4.5 G IVPB
4.5000 g | INTRAVENOUS | Status: AC
  Administered 2011-10-11: 4.5 g via INTRAVENOUS
  Filled 2011-10-11: qty 100

## 2011-10-11 NOTE — ED Provider Notes (Addendum)
History     CSN: [id number] Arrival date & time: 10/11/2011 12:44 AM   First MD Initiated Contact with Patient 10/11/11 0150      Chief Complaint  Patient presents with  . Chills    (Consider location/radiation/quality/duration/timing/severity/associated sxs/prior treatment) HPI This is a 65 year old white male who awoke from sleep about 12:30 this morning having severe shaking chills. This was accompanied by shortness of breath. He denies having chest pain. He was nauseated but did not vomit. The chills and shortness of breath have improved since being in the department. It is noted that he was febrile to 103 on arrival. He denies sore throat, nasal congestion, diarrhea or dysuria. He has had a cough for over a month.  Past Medical History  Diagnosis Date  . CAD (coronary artery disease)   . Hypertension   . Myocardial infarction   . Diabetes mellitus   . Hyperlipidemia     Past Surgical History  Procedure Date  . Stents 2 times for vessel disease-kelly     4 vessels  . Appendectomy     Family History  Problem Relation Age of Onset  . Alzheimer's disease Mother     History  Substance Use Topics  . Smoking status: Current Everyday Smoker -- 1.0 packs/day  . Smokeless tobacco: Not on file  . Alcohol Use: Yes      Review of Systems  All other systems reviewed and are negative.    Allergies  Review of patient's allergies indicates no known allergies.  Home Medications   Current Outpatient Rx  Name Route Sig Dispense Refill  . ASPIRIN 81 MG PO TABS Oral Take 81 mg by mouth daily.      . ATENOLOL 50 MG PO TABS Oral Take 75 mg by mouth daily.     Marland Kitchen BRIMONIDINE TARTRATE 0.2 % OP SOLN  1 drop 2 (two) times daily.      Marland Kitchen CLOPIDOGREL BISULFATE 75 MG PO TABS Oral Take 75 mg by mouth daily.      Marland Kitchen FEXOFENADINE HCL 180 MG PO TABS Oral Take 180 mg by mouth daily as needed. For allergy    . LATANOPROST 0.005 % OP SOLN Both Eyes Place 1 drop into both eyes at bedtime.       Marland Kitchen LISINOPRIL 2.5 MG PO TABS Oral Take 2.5 mg by mouth daily.      Marland Kitchen SIMVASTATIN 40 MG PO TABS Oral Take 40 mg by mouth at bedtime.      Marland Kitchen SITAGLIPTIN-METFORMIN HCL 50-500 MG PO TABS Oral Take 1 tablet by mouth daily.        BP 119/64  Pulse 103  Temp(Src) 103 F (39.4 C) (Oral)  Resp 22  SpO2 93%  Physical Exam General: Well-developed, well-nourished male in no acute distress; appearance consistent with age of record HENT: normocephalic, atraumatic Eyes: Normal per Neck: supple Heart: regular rate and rhythm; tach Lungs: clear to auscultation bilaterally Abdomen: soft; nontender; nondistended; bowel sounds present Extremities: No deformity; full range of motion; trace edema of lower leg Neurologic: Awake, alert and oriented; motor function intact in all extremities and symmetric; no facial droop Skin: Warm and dry    ED Course  CRITICAL CARE Performed by: Hanley Seamen Authorized by: Hanley Seamen Total critical care time: 45 minutes Critical care was necessary to treat or prevent imminent or life-threatening deterioration of the following conditions: sepsis. Critical care was time spent personally by me on the following activities: development of treatment plan with  patient or surrogate, discussions with consultants, evaluation of patient's response to treatment, examination of patient, obtaining history from patient or surrogate, ordering and performing treatments and interventions, ordering and review of laboratory studies, ordering and review of radiographic studies, pulse oximetry, re-evaluation of patient's condition and review of old charts.   (including critical care time)    MDM  EKG Interpretation:  Date & Time: 10/11/2011 00:64M  Rate: 104  Rhythm: sinus tachycardia  QRS Axis: normal  Intervals: normal  ST/T Wave abnormalities: nonspecific T wave changes  Conduction Disutrbances:none  Narrative Interpretation:   Old EKG Reviewed: T wave changes not  seen previously  Nursing notes and vitals signs, including pulse oximetry, reviewed.  Summary of this visit's results, reviewed by myself:  Labs:  Results for orders placed during the hospital encounter of 10/11/11  CBC      Component Value Range   WBC 3.8 (*) 4.0 - 10.5 (K/uL)   RBC 4.28  4.22 - 5.81 (MIL/uL)   Hemoglobin 13.8  13.0 - 17.0 (g/dL)   HCT 95.6  21.3 - 08.6 (%)   MCV 93.9  78.0 - 100.0 (fL)   MCH 32.2  26.0 - 34.0 (pg)   MCHC 34.3  30.0 - 36.0 (g/dL)   RDW 57.8  46.9 - 62.9 (%)   Platelets 120 (*) 150 - 400 (K/uL)  DIFFERENTIAL      Component Value Range   Neutrophils Relative 86 (*) 43 - 77 (%)   Neutro Abs 3.3  1.7 - 7.7 (K/uL)   Lymphocytes Relative 12  12 - 46 (%)   Lymphs Abs 0.4 (*) 0.7 - 4.0 (K/uL)   Monocytes Relative 1 (*) 3 - 12 (%)   Monocytes Absolute 0.0 (*) 0.1 - 1.0 (K/uL)   Eosinophils Relative 2  0 - 5 (%)   Eosinophils Absolute 0.1  0.0 - 0.7 (K/uL)   Basophils Relative 0  0 - 1 (%)   Basophils Absolute 0.0  0.0 - 0.1 (K/uL)  PROCALCITONIN      Component Value Range   Procalcitonin 0.32    LACTIC ACID, PLASMA      Component Value Range   Lactic Acid, Venous 2.6 (*) 0.5 - 2.2 (mmol/L)  URINALYSIS, ROUTINE W REFLEX MICROSCOPIC      Component Value Range   Color, Urine YELLOW  YELLOW    APPearance CLEAR  CLEAR    Specific Gravity, Urine 1.024  1.005 - 1.030    pH 6.0  5.0 - 8.0    Glucose, UA 500 (*) NEGATIVE (mg/dL)   Hgb urine dipstick NEGATIVE  NEGATIVE    Bilirubin Urine NEGATIVE  NEGATIVE    Ketones, ur NEGATIVE  NEGATIVE (mg/dL)   Protein, ur NEGATIVE  NEGATIVE (mg/dL)   Urobilinogen, UA 1.0  0.0 - 1.0 (mg/dL)   Nitrite NEGATIVE  NEGATIVE    Leukocytes, UA NEGATIVE  NEGATIVE   POCT I-STAT, CHEM 8      Component Value Range   Sodium 141  135 - 145 (mEq/L)   Potassium 3.6  3.5 - 5.1 (mEq/L)   Chloride 108  96 - 112 (mEq/L)   BUN 14  6 - 23 (mg/dL)   Creatinine, Ser 5.28  0.50 - 1.35 (mg/dL)   Glucose, Bld 413 (*) 70 - 99  (mg/dL)   Calcium, Ion 2.44  0.10 - 1.32 (mmol/L)   TCO2 21  0 - 100 (mmol/L)   Hemoglobin 13.6  13.0 - 17.0 (g/dL)   HCT 27.2  53.6 -  52.0 (%)    Imaging Studies: Dg Chest 2 View  10/11/2011  *RADIOLOGY REPORT*  Clinical Data: Cough, fever  CHEST - 2 VIEW  Comparison: 08/10/2008  Findings: Lungs are essentially clear. No pleural effusion or pneumothorax.  The heart is top normal in size.  Mild degenerative changes of the visualized thoracolumbar spine.  IMPRESSION: No evidence of acute cardiopulmonary disease.  Original Report Authenticated By: Charline Bills, M.D.   3:37 AM Patient noted to be hypotensive. This consistent with early sepsis. His lactate is mildly elevated although his procalcitonin is not. We will start antibiotics.  4:55 AM Patient has received about 1.7 L of IV fluid. His systolic blood pressure is currently 88. He has received 1 g of vancomycin and Zosyn is pending from the pharmacy.  5:31 AM Try a hospitalist to admit.  6:37 AM Tamiflu by mouth and Levaquin IV ordered at the request of Dr. Toniann Fail.   Hanley Seamen, MD 10/11/11 0532  Hanley Seamen, MD 10/11/11 [phone]

## 2011-10-11 NOTE — ED Notes (Signed)
Went to give pt Tylenol for fever. However, family and pt would like to wait 15 minutes and recheck his temperature. Explained to them the importance of Tylenol, but still would like to recheck temperature in 15 minutes. Will continue to monitor.

## 2011-10-11 NOTE — ED Notes (Addendum)
Pt came to the ED because he has had increasingly SOB starting today. Currently pt is SOB. Placed on 2L O2. EDP is aware. Pt states that he has not had any chest pain. Pt stated that he has been coughing for over 1  Month. Lung sounds are diminished. Will continue to monitor.

## 2011-10-11 NOTE — ED Notes (Signed)
Notified Dr. Read Drivers of temp; ordered 2 view CXR

## 2011-10-11 NOTE — H&P (Signed)
Evan Avery is an 65 y.o. male.  PCP - Dr. Darryll Capers  Chief Complaint: Fever and chills. HPI: 65 year-old male history of CAD status post stenting, hypertension, diabetes mellitus type 2, hyperlipidemia and ongoing tobacco abuse present with complaints of having sudden onset of fever chills start of last night at 10 PM. He went to bed and around 1:00 in a.m. his fever and chills worsened and he was brought ER. In the ER chest x-ray, urinalysis does not reveal any specific cause for infection. His lactic acid was mildly high though procalcitonin was normal. His blood pressure was the 70s initially. He was given 2 L bolus after which his blood pressure systolic was in the 110s. ER physician initially consulted critical care since he was responding to fluids hospitalist has been requested admission. Patient states he had been having dry cough over last 2 weeks and he also felt a little more short of breath last night. Denies any nausea vomiting abdominal pain diarrhea. Denies any rash or recent travel. Has had mild headache in the frontal area which has resolved. Denies any dizziness loss of consciousness or blurred vision.  Past Medical History  Diagnosis Date  . CAD (coronary artery disease)   . Hypertension   . Myocardial infarction   . Diabetes mellitus   . Hyperlipidemia     Past Surgical History  Procedure Date  . Stents 2 times for vessel disease-kelly     4 vessels  . Appendectomy     Family History  Problem Relation Age of Onset  . Alzheimer's disease Mother    Social History:  reports that he has been smoking.  He does not have any smokeless tobacco history on file. He reports that he drinks alcohol. He reports that he does not use illicit drugs.  Allergies: No Known Allergies  Medications Prior to Admission  Medication Dose Route Frequency Provider Last Rate Last Dose  . 0.9 %  sodium chloride infusion   Intravenous Once Carlisle Beers Molpus, MD 100 mL/hr at 10/11/11 4540     . acetaminophen (TYLENOL) tablet 650 mg  650 mg Oral Once Hanley Seamen, MD   650 mg at 10/11/11 0215  . Levofloxacin (LEVAQUIN) IVPB 750 mg  750 mg Intravenous To Major John L Molpus, MD      . oseltamivir (TAMIFLU) capsule 75 mg  75 mg Oral To Major John L Molpus, MD      . piperacillin-tazobactam (ZOSYN) IVPB 4.5 g  4.5 g Intravenous To Major John L Molpus, MD   4.5 g at 10/11/11 0531  . sodium chloride 0.9 % bolus 1,000 mL  1,000 mL Intravenous Once Carlisle Beers Molpus, MD   1,000 mL at 10/11/11 0339  . sodium chloride 0.9 % bolus 1,000 mL  1,000 mL Intravenous Once Carlisle Beers Molpus, MD   1,000 mL at 10/11/11 0530  . sodium chloride 0.9 % bolus 1,000 mL  1,000 mL Intravenous Once John L Molpus, MD      . vancomycin (VANCOCIN) IVPB 1000 mg/200 mL premix  1,000 mg Intravenous Once Carlisle Beers Molpus, MD   1,000 mg at 10/11/11 0350  . DISCONTD: sodium chloride 0.9 % bolus 1,000 mL  1,000 mL Intravenous Once John L Molpus, MD      . DISCONTD: sodium chloride 0.9 % bolus 2,000 mL  2,000 mL Intravenous Once Hanley Seamen, MD       Medications Prior to Admission  Medication Sig Dispense Refill  . aspirin  81 MG tablet Take 81 mg by mouth daily.        Marland Kitchen atenolol (TENORMIN) 50 MG tablet Take 75 mg by mouth daily.       . clopidogrel (PLAVIX) 75 MG tablet Take 75 mg by mouth daily.        . fexofenadine (ALLEGRA) 180 MG tablet Take 180 mg by mouth daily as needed. For allergy      . simvastatin (ZOCOR) 40 MG tablet Take 40 mg by mouth at bedtime.        . sitaGLIPtan-metformin (JANUMET) 50-500 MG per tablet Take 1 tablet by mouth daily.          Results for orders placed during the hospital encounter of 10/11/11 (from the past 48 hour(s))  CBC     Status: Abnormal   Collection Time   10/11/11  2:05 AM      Component Value Range Comment   WBC 3.8 (*) 4.0 - 10.5 (K/uL)    RBC 4.28  4.22 - 5.81 (MIL/uL)    Hemoglobin 13.8  13.0 - 17.0 (g/dL)    HCT 40.9  81.1 - 91.4 (%)    MCV 93.9  78.0 - 100.0 (fL)     MCH 32.2  26.0 - 34.0 (pg)    MCHC 34.3  30.0 - 36.0 (g/dL)    RDW 78.2  95.6 - 21.3 (%)    Platelets 120 (*) 150 - 400 (K/uL)   DIFFERENTIAL     Status: Abnormal   Collection Time   10/11/11  2:05 AM      Component Value Range Comment   Neutrophils Relative 86 (*) 43 - 77 (%)    Neutro Abs 3.3  1.7 - 7.7 (K/uL)    Lymphocytes Relative 12  12 - 46 (%)    Lymphs Abs 0.4 (*) 0.7 - 4.0 (K/uL)    Monocytes Relative 1 (*) 3 - 12 (%)    Monocytes Absolute 0.0 (*) 0.1 - 1.0 (K/uL)    Eosinophils Relative 2  0 - 5 (%)    Eosinophils Absolute 0.1  0.0 - 0.7 (K/uL)    Basophils Relative 0  0 - 1 (%)    Basophils Absolute 0.0  0.0 - 0.1 (K/uL)   PROCALCITONIN     Status: Normal   Collection Time   10/11/11  2:15 AM      Component Value Range Comment   Procalcitonin 0.32     URINALYSIS, ROUTINE W REFLEX MICROSCOPIC     Status: Abnormal   Collection Time   10/11/11  2:15 AM      Component Value Range Comment   Color, Urine YELLOW  YELLOW     APPearance CLEAR  CLEAR     Specific Gravity, Urine 1.024  1.005 - 1.030     pH 6.0  5.0 - 8.0     Glucose, UA 500 (*) NEGATIVE (mg/dL)    Hgb urine dipstick NEGATIVE  NEGATIVE     Bilirubin Urine NEGATIVE  NEGATIVE     Ketones, ur NEGATIVE  NEGATIVE (mg/dL)    Protein, ur NEGATIVE  NEGATIVE (mg/dL)    Urobilinogen, UA 1.0  0.0 - 1.0 (mg/dL)    Nitrite NEGATIVE  NEGATIVE     Leukocytes, UA NEGATIVE  NEGATIVE  MICROSCOPIC NOT DONE ON URINES WITH NEGATIVE PROTEIN, BLOOD, LEUKOCYTES, NITRITE, OR GLUCOSE <1000 mg/dL.  POCT I-STAT, CHEM 8     Status: Abnormal   Collection Time   10/11/11  2:20 AM  Component Value Range Comment   Sodium 141  135 - 145 (mEq/L)    Potassium 3.6  3.5 - 5.1 (mEq/L)    Chloride 108  96 - 112 (mEq/L)    BUN 14  6 - 23 (mg/dL)    Creatinine, Ser 1.61  0.50 - 1.35 (mg/dL)    Glucose, Bld 096 (*) 70 - 99 (mg/dL)    Calcium, Ion 0.45  1.12 - 1.32 (mmol/L)    TCO2 21  0 - 100 (mmol/L)    Hemoglobin 13.6  13.0 - 17.0  (g/dL)    HCT 40.9  81.1 - 91.4 (%)   LACTIC ACID, PLASMA     Status: Abnormal   Collection Time   10/11/11  2:21 AM      Component Value Range Comment   Lactic Acid, Venous 2.6 (*) 0.5 - 2.2 (mmol/L)    Dg Chest 2 View  10/11/2011  *RADIOLOGY REPORT*  Clinical Data: Cough, fever  CHEST - 2 VIEW  Comparison: 08/10/2008  Findings: Lungs are essentially clear. No pleural effusion or pneumothorax.  The heart is top normal in size.  Mild degenerative changes of the visualized thoracolumbar spine.  IMPRESSION: No evidence of acute cardiopulmonary disease.  Original Report Authenticated By: Charline Bills, M.D.    Review of Systems  Constitutional: Positive for fever and chills.  HENT: Negative.   Eyes: Negative.   Respiratory: Positive for cough and shortness of breath.   Cardiovascular: Negative.   Gastrointestinal: Negative.   Genitourinary: Negative.   Musculoskeletal: Negative.   Skin: Negative.   Neurological: Negative.   Endo/Heme/Allergies: Negative.   Psychiatric/Behavioral: Negative.     Blood pressure 110/59, pulse 78, temperature 98.6 F (37 C), temperature source Oral, resp. rate 16, SpO2 96.00%. Physical Exam  Constitutional: He is oriented to person, place, and time. He appears well-developed and well-nourished. No distress.  HENT:  Head: Normocephalic and atraumatic.  Right Ear: External ear normal.  Left Ear: External ear normal.  Nose: Nose normal.  Mouth/Throat: Oropharynx is clear and moist. No oropharyngeal exudate.  Eyes: Conjunctivae and EOM are normal. Pupils are equal, round, and reactive to light. Right eye exhibits no discharge. Left eye exhibits no discharge. No scleral icterus.  Neck: Normal range of motion. Neck supple. No JVD present.  Cardiovascular: Normal rate, regular rhythm and normal heart sounds.   Respiratory: Effort normal and breath sounds normal. No respiratory distress. He has no wheezes. He has no rales.  GI: Soft. Bowel sounds are  normal. He exhibits no distension. There is no tenderness. There is no rebound.  Musculoskeletal: Normal range of motion. He exhibits no edema and no tenderness.  Neurological: He is alert and oriented to person, place, and time. He has normal reflexes. No cranial nerve deficit. Coordination normal.  Skin: Skin is warm and dry. No rash noted. He is not diaphoretic. No erythema.  Psychiatric: His behavior is normal.     Assessment/Plan #1. SIRS - the source not clear but since patient is saying he has a lot of cough probably the source could be from the lung. At this time patient is responding to fluids we will continue IV fluids. We will follow blood cultures and urine cultures. Patient has been started on vancomycin and Zosyn Levaquin and Tamiflu which we will continue. Check flu PCR. Until then patient will be on droplet isolation. #2. Thrombocytopenia - closely follow platelet counts there is any further decrease may have to order DIC panel. #3. Leukopenia - his neutrophils  are 86% we'll keep a close watch on CBC. #4. CAD status post stenting - denies any chest pain EKG shows some nonspecific T-wave changes we'll cycle cardiac markers. #5. Diabetes mellitus type 2 - hold metformin for now patient will be on sliding scale coverage. #6. History of hypertension - in view of his low blood pressure we'll hold her antihypertensives. #7. Ongoing tobacco abuse - will need tobacco cessation counseling.  CODE STATUS  - full code.   Eduard Clos 10/11/2011, 6:17 AM

## 2011-10-11 NOTE — ED Notes (Signed)
Notified Dr. Read Drivers of pts temp and BP

## 2011-10-11 NOTE — Progress Notes (Signed)
PHARMACY - CRITICAL CARE PROGRESS NOTE  Initial Pharmacy Consult for Vancomycin/Levofloxacin/Zosyn Indication: Empiric coverage for sepsis   No Known Allergies  Patient Measurements: Patient-reported weight: 250 lbs (113.6kg) Patient-reported height: 5'9" IBW: 70.7kg  Vital Signs: Temp: 98.8 F (37.1 C) (12/19 0701) Temp src: Oral (12/19 0701) BP: 99/49 mmHg (12/19 0800) Pulse Rate: 77  (12/19 0800) Intake/Output from previous day:   Intake/Output from this shift: Total I/O In: -  Out: 400 [Urine:400] Vent settings for last 24 hours:    Labs:  [facility] 10/11/11 0220 10/11/11 0205  WBC -- 3.8*  HGB 13.6 13.8  HCT 40.0 40.2  PLT -- 120*  APTT -- --  INR -- --  CREATININE 1.00 --  LABCREA -- --  CREATININE 1.00 --  LABCREA -- --  CREAT24HRUR -- --  MG -- --  PHOS -- --  ALBUMIN -- --  PROT -- --  AST -- --  ALT -- --  ALKPHOS -- --  BILITOT -- --  BILIDIR -- --  IBILI -- --   The CrCl is unknown because both a height and weight (above a minimum accepted value) are required for this calculation.  No results found for this basename: GLUCAP:3 in the last 72 hours  Microbiology: No results found for this or any previous visit (from the past 720 hour(s)).  Medications:  Prescriptions prior to admission  Medication Sig Dispense Refill  . aspirin 81 MG tablet Take 81 mg by mouth daily.        Marland Kitchen atenolol (TENORMIN) 50 MG tablet Take 75 mg by mouth daily.       . brimonidine (ALPHAGAN) 0.2 % ophthalmic solution 1 drop 2 (two) times daily.        . clopidogrel (PLAVIX) 75 MG tablet Take 75 mg by mouth daily.        . fexofenadine (ALLEGRA) 180 MG tablet Take 180 mg by mouth daily as needed. For allergy      . latanoprost (XALATAN) 0.005 % ophthalmic solution Place 1 drop into both eyes at bedtime.        Marland Kitchen lisinopril (PRINIVIL,ZESTRIL) 2.5 MG tablet Take 2.5 mg by mouth daily.        . simvastatin (ZOCOR) 40 MG tablet Take 40 mg by mouth at bedtime.        .  sitaGLIPtan-metformin (JANUMET) 50-500 MG per tablet Take 1 tablet by mouth daily.         Scheduled:    . sodium chloride   Intravenous Once  . acetaminophen  650 mg Oral Once  . albuterol  2.5 mg Nebulization Q6H  . aspirin  81 mg Oral Daily  . brimonidine  1 drop Both Eyes BID  . clopidogrel  75 mg Oral Daily  . insulin aspart  0-9 Units Subcutaneous TID WC  . latanoprost  1 drop Both Eyes QHS  . levofloxacin (LEVAQUIN) IV  750 mg Intravenous To Major  . loratadine  10 mg Oral Daily  . oseltamivir  75 mg Oral To Major  . oseltamivir  75 mg Oral BID  . piperacillin-tazobactam (ZOSYN)  IV  4.5 g Intravenous To Major  . simvastatin  40 mg Oral QHS  . sodium chloride  1,000 mL Intravenous Once  . sodium chloride  1,000 mL Intravenous Once  . sodium chloride  1,000 mL Intravenous Once  . vancomycin  1,000 mg Intravenous Once  . DISCONTD: sodium chloride  1,000 mL Intravenous Once  . DISCONTD: sodium chloride  2,000 mL Intravenous Once   Infusions:    . sodium chloride     Patient Active Problem List  Diagnoses  . DIABETES MELLITUS, TYPE II  . Other and Unspecified Hyperlipidemia  . HYPERTENSION  . MYOCARDIAL INFARCTION, HX OF  . CORONARY ARTERY DISEASE  . OTHER ATOPIC DERMATITIS AND RELATED CONDITIONS  . PRURITUS  . HIP PAIN, BILATERAL  . LUMBAR RADICULOPATHY  . SLEEP APNEA, CHRONIC  . Anxiety as acute reaction to gross stress  . Acute sinus infection  . SIRS (systemic inflammatory response syndrome)    Assessment: 65 yo male with hx/o CAD s/p stenting, who presents with sudden onset of fever/chills last evening.  No specific source of infection identified yet, but patient reports having a persistent cough.  Pharmacy System-Based Review Anticoagulation: SCDs due to low PLT count Infectious Diseases: Tm 103, WBC 3.8, 86% neutr, flu PCR (-), BCx & UCx pending, received initial doses of antibiotics in ED Neurology: AAO x3 Pulmonary: 97% 1L [place], scheduled & PRN alb  nebs Cardiovascular: CAD/HTN/HLD, BP 78-138/33-65 HR 73-103, denies CP, f/u cardiac markers, on home ASA, plavix, statin, atenolol/lisinopril on hold d/t hypotension, responding to fluid boluses Endocrinology: DM, home metformin on hold, SSI Fluids/Electrolyes/Nutrition: lytes WNL  Nephrology: CrCl~73 mL/min, UA clear, UoP 400cc Hematology/Oncology: Thrombocytopenia (PLT 120), follow closely, may need DIC panel Best Practices: SCDs, home meds addressed  Goal of Therapy:  Vancomycin trough level: 15-20 mg/dL  Plan:  1. Vancomycin 1gm IV q12h. 2. Zosyn 3.375gm IV q8h (4 hour infusion) 3. Levofloxacin 750mg  IV q24h 4. Recommend to d/c Tamiflu as PCR negative. 5. Follow-up culture data and clinical status improvement.  Ival Bible 10/11/2011,9:17 AM

## 2011-10-11 NOTE — Progress Notes (Signed)
   CARE MANAGEMENT NOTE 10/11/2011  Patient:  Evan Avery, Evan Avery   Account Number:  [id number]  Date Initiated:  10/11/2011  Documentation initiated by:  Onnie Boer  Subjective/Objective Assessment:   PT WAS ADMITTED WITH  SIRS     Action/Plan:   PROGRESSION OF CARE AND DISCHARGE PLANNING   Anticipated DC Date:  10/18/2011   Anticipated DC Plan:  HOME/SELF CARE      DC Planning Services  CM consult      Choice offered to / List presented to:             Status of service:  In process, will continue to follow Medicare Important Message given?   (If response is "NO", the following Medicare IM given date fields will be blank) Date Medicare IM given:   Date Additional Medicare IM given:    Discharge Disposition:    Per UR Regulation:  Reviewed for med. necessity/level of care/duration of stay  Comments:  10/10/2011 Onnie Boer, RN, BSN 1642 PT WAS ADMITTED WITH SIRS, ?FLU.  PTA PT WAS AT HOME WITH SELF CARE.  WILL F/U ON DC NEEDS

## 2011-10-12 DIAGNOSIS — I959 Hypotension, unspecified: Secondary | ICD-10-CM | POA: Diagnosis present

## 2011-10-12 DIAGNOSIS — D696 Thrombocytopenia, unspecified: Secondary | ICD-10-CM | POA: Diagnosis present

## 2011-10-12 LAB — DIFFERENTIAL
Eosinophils Relative: 2 % (ref 0–5)
Lymphocytes Relative: 14 % (ref 12–46)
Lymphs Abs: 0.9 10*3/uL (ref 0.7–4.0)
Monocytes Absolute: 0.8 10*3/uL (ref 0.1–1.0)
Monocytes Relative: 12 % (ref 3–12)
Neutro Abs: 4.9 10*3/uL (ref 1.7–7.7)

## 2011-10-12 LAB — URINE CULTURE
Colony Count: NO GROWTH
Culture: NO GROWTH

## 2011-10-12 LAB — CBC
HCT: 33 % — ABNORMAL LOW (ref 39.0–52.0)
Hemoglobin: 11.1 g/dL — ABNORMAL LOW (ref 13.0–17.0)
Hemoglobin: 11.5 g/dL — ABNORMAL LOW (ref 13.0–17.0)
MCH: 31.9 pg (ref 26.0–34.0)
MCV: 95.1 fL (ref 78.0–100.0)
MCV: 95 fL (ref 78.0–100.0)
RBC: 3.47 MIL/uL — ABNORMAL LOW (ref 4.22–5.81)
RBC: 3.61 MIL/uL — ABNORMAL LOW (ref 4.22–5.81)
RDW: 13.4 % (ref 11.5–15.5)
WBC: 6.8 10*3/uL (ref 4.0–10.5)
WBC: 5.9 10*3/uL (ref 4.0–10.5)

## 2011-10-12 LAB — COMPREHENSIVE METABOLIC PANEL
Albumin: 3 g/dL — ABNORMAL LOW (ref 3.5–5.2)
Alkaline Phosphatase: 56 U/L (ref 39–117)
BUN: 12 mg/dL (ref 6–23)
Calcium: 8.7 mg/dL (ref 8.4–10.5)
GFR calc Af Amer: >90 mL/min (ref >90)
Potassium: 3.6 mEq/L (ref 3.5–5.1)
Total Protein: 5.7 g/dL — ABNORMAL LOW (ref 6.0–8.3)

## 2011-10-12 LAB — BASIC METABOLIC PANEL
CO2: 24 mEq/L (ref 19–32)
Calcium: 8.7 mg/dL (ref 8.4–10.5)
Chloride: 108 mEq/L (ref 96–112)
Creatinine, Ser: 0.84 mg/dL (ref 0.50–1.35)
Glucose, Bld: 155 mg/dL — ABNORMAL HIGH (ref 70–99)

## 2011-10-12 LAB — GLUCOSE, CAPILLARY
Glucose-Capillary: 154 mg/dL — ABNORMAL HIGH (ref 70–99)
Glucose-Capillary: 184 mg/dL — ABNORMAL HIGH (ref 70–99)

## 2011-10-12 LAB — CARDIAC PANEL(CRET KIN+CKTOT+MB+TROPI)
CK, MB: 2 ng/mL (ref 0.3–4.0)
Relative Index: 1.4 (ref 0.0–2.5)
Troponin I: <0.3 ng/mL (ref <0.30)

## 2011-10-12 LAB — HEMOGLOBIN A1C: Mean Plasma Glucose: 140 mg/dL — ABNORMAL HIGH (ref <117)

## 2011-10-12 MED ORDER — METFORMIN HCL 500 MG PO TABS
500.0000 mg | ORAL_TABLET | Freq: Every day | ORAL | Status: DC
  Filled 2011-10-12 (×2): qty 1

## 2011-10-12 MED ORDER — SITAGLIPTIN PHOSPHATE 25 MG PO TABS
50.0000 mg | ORAL_TABLET | Freq: Every day | ORAL | Status: DC
  Filled 2011-10-12: qty 2

## 2011-10-12 MED ORDER — SITAGLIPTIN PHOS-METFORMIN HCL 50-500 MG PO TABS: 1.0000 | ORAL_TABLET | Freq: Every day | ORAL | Status: DC

## 2011-10-12 MED ORDER — LINAGLIPTIN 5 MG PO TABS
5.0000 mg | ORAL_TABLET | Freq: Every day | ORAL | Status: DC
  Administered 2011-10-13: 5 mg via ORAL
  Filled 2011-10-12 (×3): qty 1

## 2011-10-12 MED ORDER — SITAGLIPTIN PHOS-METFORMIN HCL 50-500 MG PO TABS
1.0000 | ORAL_TABLET | Freq: Every day | ORAL | Status: DC
  Filled 2011-10-12: qty 1

## 2011-10-12 MED ORDER — SODIUM CHLORIDE 0.9 % IV SOLN: INTRAVENOUS | Status: DC

## 2011-10-12 NOTE — Plan of Care (Signed)
Problem: Consults Goal: General Medical Patient Education See Patient Education Module for specific education. Outcome: Progressing Pt instructed when and how to use call bell Set up to unit.

## 2011-10-12 NOTE — Progress Notes (Signed)
Patient transferred to 5524 -1 via wheelchair.  VSS. Afebrile.  Tolerating ambulating in room without difficulty.

## 2011-10-12 NOTE — Progress Notes (Signed)
Patient placed on home cpap unit. Patient is tolerating well.

## 2011-10-12 NOTE — Progress Notes (Signed)
Subjective: HPI- Evan Avery present with complaints of having sudden onset of fever chills start of last night at 10 PM. He went to bed and around 1:00 in a.m. his fever and chills worsened and he was brought ER. In the ER chest x-ray, urinalysis does not reveal any specific cause for infection. His lactic acid was mildly high though procalcitonin was normal. His blood pressure was the 70s initially. He was given 2 L bolus after which his blood pressure systolic was in the 110s. ER physician initially consulted critical care since he was responding to fluids hospitalist has been requested admission. Patient states he had been having dry cough over last 2 weeks and he also felt a little more short of breath last night. Denies any nausea vomiting abdominal pain diarrhea. Denies any rash or recent travel. Has had mild headache in the frontal area which has resolved. Denies any dizziness loss of consciousness or blurred vision.   Objective: Blood pressure 104/53, pulse 65, temperature 98.2 F (36.8 C), temperature source Oral, resp. rate 16, height 5\' 9"  (1.753 m), weight 113.2 kg (249 lb 9 oz), SpO2 94.00%. Weight change:   Intake/Output Summary (Last 24 hours) at 10/12/11 1127 Last data filed at 10/12/11 1000  Gross per 24 hour  Intake   3590 ml  Output   4225 ml  Net   -635 ml    Physical Exam: BP 104/53  Pulse 65  Temp(Src) 98.2 F (36.8 C) (Oral)  Resp 16  Ht 5\' 9"  (1.753 m)  Wt 113.2 kg (249 lb 9 oz)  BMI 36.85 kg/m2  SpO2 94%  General Appearance:    Alert, cooperative, no distress, appears stated age  Head:    Normocephalic, without obvious abnormality, atraumatic  Eyes:    PERRL, conjunctiva/corneas clear, EOM's intact,           Throat:   Lips, mucosa, and tongue normal; teeth and gums normal  Neck:   Supple, symmetrical, trachea midline, no adenopathy;       thyroid:  No enlargement/tenderness/nodules     Lungs:     Clear to auscultation bilaterally, respirations unlabored       Heart:    Regular rate and rhythm, S1 and S2 normal, no murmur, rub   or gallop  Abdomen:     Soft, non-tender, bowel sounds active all four quadrants,    no masses, no organomegaly        Extremities:   Extremities normal, atraumatic, no cyanosis or edema  Pulses:   2+ and symmetric all extremities  Skin:   Skin color, texture, turgor normal, no rashes or lesions     Neurologic:   CNII-XII intact. Normal strength throughout    Lab Results:  [facility] 10/12/11 0055 10/11/11 0220  NA 137 141  K 3.6 3.6  CL 106 108  CO2 24 --  GLUCOSE 134* 130*  BUN 12 14  CREATININE 0.97 1.00  CALCIUM 8.7 --  MG -- --  PHOS -- --    Basename 10/12/11 0055  AST 21  ALT 22  ALKPHOS 56  BILITOT 0.4  PROT 5.7*  ALBUMIN 3.0*   No results found for this basename: LIPASE:2,AMYLASE:2 in the last 72 hours  Basename 10/12/11 0055 10/11/11 0220 10/11/11 0205  WBC 6.8 -- 3.8*  NEUTROABS 4.9 -- 3.3  HGB 11.1* 13.6 --  HCT 33.0* 40.0 --  MCV 95.1 -- 93.9  PLT 82* -- 120*    Basename 10/12/11 0055 10/11/11 1825  CKTOTAL  139 167  CKMB 2.0 2.4  CKMBINDEX -- --  TROPONINI <0.30 <0.30   No components found with this basename: POCBNP:3 No results found for this basename: DDIMER:2 in the last 72 hours  Basename 10/11/11 1811  HGBA1C 6.5*   No results found for this basename: CHOL:2,HDL:2,LDLCALC:2,TRIG:2,CHOLHDL:2,LDLDIRECT:2 in the last 72 hours No results found for this basename: TSH,T4TOTAL,FREET3,T3FREE,THYROIDAB in the last 72 hours No results found for this basename: VITAMINB12:2,FOLATE:2,FERRITIN:2,TIBC:2,IRON:2,RETICCTPCT:2 in the last 72 hours  Micro Results: Recent Results (from the past 240 hour(s))  CULTURE, BLOOD (ROUTINE X 2)     Status: Normal (Preliminary result)   Collection Time   10/11/11  2:15 AM      Component Value Range Status Comment   Specimen Description BLOOD ARM RIGHT   Final    Special Requests BOTTLES DRAWN AEROBIC AND ANAEROBIC 10CC   Final    Setup  Time [id number]   Final    Culture     Final    Value:        BLOOD CULTURE RECEIVED NO GROWTH TO DATE CULTURE WILL BE HELD FOR 5 DAYS BEFORE ISSUING A FINAL NEGATIVE REPORT   Report Status PENDING   Incomplete   URINE CULTURE     Status: Normal   Collection Time   10/11/11  2:15 AM      Component Value Range Status Comment   Specimen Description URINE, CLEAN CATCH   Final    Special Requests ADDED AT 0814   Final    Setup Time [id number]   Final    Colony Count NO GROWTH   Final    Culture NO GROWTH   Final    Report Status 10/12/2011 FINAL   Final   CULTURE, BLOOD (ROUTINE X 2)     Status: Normal (Preliminary result)   Collection Time   10/11/11  2:30 AM      Component Value Range Status Comment   Specimen Description BLOOD ARM LEFT   Final    Special Requests BOTTLES DRAWN AEROBIC AND ANAEROBIC 10CC   Final    Setup Time [id number]   Final    Culture     Final    Value:        BLOOD CULTURE RECEIVED NO GROWTH TO DATE CULTURE WILL BE HELD FOR 5 DAYS BEFORE ISSUING A FINAL NEGATIVE REPORT   Report Status PENDING   Incomplete   MRSA PCR SCREENING     Status: Normal   Collection Time   10/11/11  8:28 AM      Component Value Range Status Comment   MRSA by PCR NEGATIVE  NEGATIVE  Final     Studies/Results: Dg Chest 2 View  10/11/2011  *RADIOLOGY REPORT*  Clinical Data: Cough, fever  CHEST - 2 VIEW  Comparison: 08/10/2008  Findings: Lungs are essentially clear. No pleural effusion or pneumothorax.  The heart is top normal in size.  Mild degenerative changes of the visualized thoracolumbar spine.  IMPRESSION: No evidence of acute cardiopulmonary disease.  Original Report Authenticated By: Charline Bills, M.D.    Medications: Scheduled Meds:   . albuterol  2.5 mg Nebulization Q6H  . aspirin  81 mg Oral Daily  . brimonidine  1 drop Both Eyes BID  . clopidogrel  75 mg Oral Daily  . insulin aspart  0-9 Units Subcutaneous TID WC  . latanoprost  1 drop Both Eyes QHS  .  levofloxacin (LEVAQUIN) IV  750 mg Intravenous Q24H  . loratadine  10 mg Oral  Daily  . simvastatin  40 mg Oral QHS  . sitaGLIPtan-metformin  1 tablet Oral Daily  . DISCONTD: oseltamivir  75 mg Oral BID  . DISCONTD: piperacillin-tazobactam (ZOSYN)  IV  3.375 g Intravenous Q8H  . DISCONTD: vancomycin  1,000 mg Intravenous Q12H   Continuous Infusions:   . sodium chloride 110 mL/hr at 10/11/11 1945   PRN Meds:.acetaminophen, acetaminophen, albuterol, ondansetron (ZOFRAN) IV, ondansetron  Assessment/Plan:   *SIRS (systemic inflammatory response syndrome) No source of infection found as of yet. D/C Levaquin.    Hypotension- BP still low therefore holding medications.  Thrombocytopenia- getting worse. Dilutional ? Due to viral illness? Recheck in AM  DIABETES MELLITUS, TYPE II- resume home meds.   CORONARY ARTERY DISEASE- with stents  At this point his has high urine output and is eating and drinking therefore will stop IVF. We will watch BP and platelets at least until tomorrow. If he continues to do well, he may be able to go home tomorrow.     LOS: 1 day   [facility] [phone] 10/12/2011, 11:27 AM

## 2011-10-13 LAB — CBC
HCT: 36.4 % — ABNORMAL LOW (ref 39.0–52.0)
MCHC: 33 g/dL (ref 30.0–36.0)
MCV: 95.8 fL (ref 78.0–100.0)
Platelets: 100 10*3/uL — ABNORMAL LOW (ref 150–400)
RDW: 13.3 % (ref 11.5–15.5)

## 2011-10-13 MED ORDER — AMOXICILLIN-POT CLAVULANATE 875-125 MG PO TABS: 1.0000 | ORAL_TABLET | Freq: Two times a day (BID) | ORAL | Status: AC

## 2011-10-13 MED ORDER — ALBUTEROL SULFATE HFA 108 (90 BASE) MCG/ACT IN AERS: 2.0000 | INHALATION_SPRAY | Freq: Four times a day (QID) | RESPIRATORY_TRACT | Status: DC | PRN

## 2011-10-13 MED ORDER — GUAIFENESIN 100 MG/5ML PO LIQD: 200.0000 mg | Freq: Three times a day (TID) | ORAL | Status: AC | PRN

## 2011-10-13 MED ORDER — DEXTROSE 5 % IV SOLN
1.0000 g | Freq: Three times a day (TID) | INTRAVENOUS | Status: DC
  Administered 2011-10-13: 1 g via INTRAVENOUS
  Filled 2011-10-13 (×3): qty 1

## 2011-10-13 NOTE — Progress Notes (Signed)
CRITICAL VALUE ALERT  Critical value received:  Anaerobic blood cultures positive for gram negative rods  Date of notification:  10/13/11  Time of notification:  0310  Critical value read back:yes  Nurse who received alert:  Bernie Covey, RN  MD notified (1st page):  Donnamarie Poag, NP  Time of first page:  0340  MD notified (2nd page):  Time of second page:  Responding MD:  Donnamarie Poag, NP  Time MD responded:  [phone]

## 2011-10-13 NOTE — Discharge Summary (Signed)
Patient ID: Evan Avery MRN: [id number] DOB/AGE: 1946/02/06 65 y.o.  Admit date: 10/11/2011 Discharge date: 10/13/2011  Primary Care Physician:  Carrie Mew, MD  Discharge Diagnoses:   Present on Admission:  .SIRS (systemic inflammatory response syndrome) - from unknown etiology, possibly viral. .Diabetes Type II .Hypertension .Coronary Artery Disease S/P multiple cardiac stents. .Thrombocytopenia .Hypotension    Current Discharge Medication List    START taking these medications   Details  albuterol (PROVENTIL HFA;VENTOLIN HFA) 108 (90 BASE) MCG/ACT inhaler Inhale 2 puffs into the lungs every 6 (six) hours as needed for wheezing (Cough). Qty: 1 Inhaler, Refills: 0    amoxicillin-clavulanate (AUGMENTIN) 875-125 MG per tablet Take 1 tablet by mouth 2 (two) times daily. Qty: 28 tablet, Refills: 0    guaiFENesin (ROBITUSSIN) 100 MG/5ML liquid Take 10 mLs (200 mg total) by mouth 3 (three) times daily as needed for cough. Qty: 120 mL, Refills: 0      CONTINUE these medications which have NOT CHANGED   Details  aspirin 81 MG tablet Take 81 mg by mouth daily.      atenolol (TENORMIN) 50 MG tablet Take 75 mg by mouth daily.     brimonidine (ALPHAGAN) 0.2 % ophthalmic solution 1 drop 2 (two) times daily.      clopidogrel (PLAVIX) 75 MG tablet Take 75 mg by mouth daily.      fexofenadine (ALLEGRA) 180 MG tablet Take 180 mg by mouth daily as needed. For allergy    latanoprost (XALATAN) 0.005 % ophthalmic solution Place 1 drop into both eyes at bedtime.      lisinopril (PRINIVIL,ZESTRIL) 2.5 MG tablet Take 2.5 mg by mouth daily.      simvastatin (ZOCOR) 40 MG tablet Take 40 mg by mouth at bedtime.      sitaGLIPtan-metformin (JANUMET) 50-500 MG per tablet Take 1 tablet by mouth daily.          Consults:  Critical Care at the time of admission.  Brief H and P:  HPI- Mr Gastineau presented with complaints of having sudden onset of fever chills starting  Tuesday night at 10 PM. His fever and chills worsened and he was brought ER. In the ER chest x-ray, urinalysis does not reveal any specific cause for infection. His lactic acid was mildly high though procalcitonin was normal. His blood pressure was the 70s initially. He was given 2 L bolus after which his blood pressure systolic was in the 110s. ER physician initially consulted critical care since he was responding to fluids hospitalist has been requested admission. Patient states he had been having dry cough over last 2 weeks and he also felt a little more short of breath last night. Denies any nausea vomiting abdominal pain diarrhea. Denies any rash or recent travel. Has had mild headache in the frontal area which has resolved. Denies any dizziness loss of consciousness or blurred vision.  Mr. Balestrieri was initially started on Vancomycin, Zosyn and Levaquin, and was admitted to the ICU with hypotension and sepsis.  He quickly improved.  Rapid influenza testing was negative.  His blood pressure responded to fluids.  By Wednesday morning he was much improved, and moved to a step down bed.  His vancomycin and Zosyn were discontinued and he remained on Levaquin. On Thursday he was moved to a regular bed on the floor and his Levaquin was discontinued. Vital signs remained stable. He's had no fever over the last 24 hours.  Is ambulating well has no signs of hypotension and desires  discharge to home. We will discharge him in stable condition on 14 days of Augmentin.  He will have PCP followup on December 26.  He developed acute thrombocytopenia with this illness.  Today his platelets are slowly trending up, they are 100.  Physical Exam on Discharge: General: Alert, awake, oriented x3, in no acute distress. Walking about the room  HEENT: No bruits, no goiter. Heart: Regular rate and rhythm, without murmurs, rubs, gallops. Lungs: Clear to auscultation bilaterally. Still with cough. Abdomen: Soft, nontender,  nondistended, positive bowel sounds. Extremities: No clubbing cyanosis or edema with positive pedal pulses. Neuro: Grossly intact, nonfocal.  Filed Vitals:   10/12/11 1600 10/12/11 1848 10/12/11 2232 10/13/11 0600  BP: 120/59 142/74 152/79 136/83  Pulse: 66 65 64 72  Temp: 97.5 F (36.4 C) 98.5 F (36.9 C) 99 F (37.2 C) 97.9 F (36.6 C)  TempSrc: Oral Oral Oral Oral  Resp: 16 18 19 18   Height:      Weight:      SpO2: 97% 95% 96% 93%     Intake/Output Summary (Last 24 hours) at 10/13/11 1033 Last data filed at 10/13/11 0500  Gross per 24 hour  Intake    370 ml  Output    677 ml  Net   -307 ml    Basic Metabolic Panel:  Lab 10/12/11 1610 10/12/11 0055  NA 138 137  K 3.9 3.6  CL 108 106  CO2 24 24  GLUCOSE 155* 134*  BUN 9 12  CREATININE 0.84 0.97  CALCIUM 8.7 8.7  MG -- --  PHOS -- --   Liver Function Tests:  Lab 10/12/11 0055  AST 21  ALT 22  ALKPHOS 56  BILITOT 0.4  PROT 5.7*  ALBUMIN 3.0*   CBC:  Lab 10/13/11 0700 10/12/11 1200 10/12/11 0055 10/11/11 0205  WBC 6.4 5.9 -- --  NEUTROABS -- -- 4.9 3.3  HGB 12.0* 11.5* -- --  HCT 36.4* 34.3* -- --  MCV 95.8 95.0 -- --  PLT 100* 81* -- --   Cardiac Enzymes:  Lab 10/12/11 0055 10/11/11 1825  CKTOTAL 139 167  CKMB 2.0 2.4  CKMBINDEX -- --  TROPONINI <0.30 <0.30   CBG:  Lab 10/13/11 0824 10/12/11 2229 10/12/11 1610 10/12/11 1214 10/12/11 0710 10/11/11 1833  GLUCAP 130* 116* 184* 154* 138* 138*   Hemoglobin A1C:  Lab 10/11/11 1811  HGBA1C 6.5*    Other Pertinent Lab Results:   Rapid Flu Testing:  Negative for Influenza A, B, H1N1  Significant Diagnostic Studies:  Dg Chest 2 View  10/11/2011  *RADIOLOGY REPORT*   IMPRESSION: No evidence of acute cardiopulmonary disease.  Original Report Authenticated By: Charline Bills, M.D.    Disposition and Follow-up: Stable for discharge to home. He'll receive 14 days of Augmentin twice a day. He has a followup appointment scheduled with Dr.  Lovell Sheehan nurse practitioner on December 26. He may return to work when he is cleared by his primary care physician.  Please recheck cbc for thrombocytopenia.  Time spent on Discharge: 45 min.  SignedStephani Police 10/13/2011, 10:33 AM [phone]

## 2011-10-13 NOTE — Progress Notes (Signed)
Patient discharge teaching given, including activity, diet, follow-up appoints, and medications. Patient verbalized understanding of all discharge instructions. IV access was d/c'd. Vitals are stable. Skin is intact. Pt to be escorted out by NT, to be driven home by family.  

## 2011-10-16 LAB — CULTURE, BLOOD (ROUTINE X 2): Culture  Setup Time: 201212190841

## 2011-10-16 NOTE — Progress Notes (Signed)
   CARE MANAGEMENT NOTE 10/16/2011  Patient:  Evan Avery, Evan Avery   Account Number:  [id number]  Date Initiated:  10/11/2011  Documentation initiated by:  Onnie Boer  Subjective/Objective Assessment:   PT WAS ADMITTED WITH  SIRS     Action/Plan:   PROGRESSION OF CARE AND DISCHARGE PLANNING   Anticipated DC Date:  10/18/2011   Anticipated DC Plan:  HOME/SELF CARE      DC Planning Services  CM consult      Choice offered to / List presented to:             Status of service:  Completed, signed off Medicare Important Message given?   (If response is "NO", the following Medicare IM given date fields will be blank) Date Medicare IM given:   Date Additional Medicare IM given:    Discharge Disposition:  HOME/SELF CARE  Per UR Regulation:  Reviewed for med. necessity/level of care/duration of stay  Comments:  10/16/2011 Onnie Boer, RN, BSN 1412 PT WAS DC'D HOME WITH SELF CARE  10/10/2011 Onnie Boer, RN, BSN 1642 PT WAS ADMITTED WITH SIRS, ?FLU.  PTA PT WAS AT HOME WITH SELF CARE.  WILL F/U ON DC NEEDS

## 2011-10-17 LAB — CULTURE, BLOOD (ROUTINE X 2): Culture: NO GROWTH

## 2011-10-18 ENCOUNTER — Encounter: Payer: Self-pay | Admitting: Family Medicine

## 2011-10-18 ENCOUNTER — Ambulatory Visit (INDEPENDENT_AMBULATORY_CARE_PROVIDER_SITE_OTHER): Payer: BC Managed Care – PPO | Admitting: Family Medicine

## 2011-10-18 VITALS — BP 130/80 | HR 61 | Temp 98.0°F | Wt 253.0 lb

## 2011-10-18 DIAGNOSIS — A419 Sepsis, unspecified organism: Secondary | ICD-10-CM

## 2011-10-18 DIAGNOSIS — R5383 Other fatigue: Secondary | ICD-10-CM

## 2011-10-18 DIAGNOSIS — R5381 Other malaise: Secondary | ICD-10-CM

## 2011-10-18 LAB — CBC WITH DIFFERENTIAL/PLATELET
Basophils Absolute: 0.1 10*3/uL (ref 0.0–0.1)
Eosinophils Absolute: 0.1 10*3/uL (ref 0.0–0.7)
HCT: 36.9 % — ABNORMAL LOW (ref 39.0–52.0)
Hemoglobin: 12.6 g/dL — ABNORMAL LOW (ref 13.0–17.0)
Lymphs Abs: 1.7 10*3/uL (ref 0.7–4.0)
MCHC: 34.2 g/dL (ref 30.0–36.0)
Monocytes Absolute: 0.7 10*3/uL (ref 0.1–1.0)
Neutro Abs: 4.5 10*3/uL (ref 1.4–7.7)
Platelets: 160 10*3/uL (ref 150.0–400.0)
RDW: 12.9 % (ref 11.5–14.6)

## 2011-10-18 LAB — COMPREHENSIVE METABOLIC PANEL
ALT: 31 U/L (ref 0–53)
AST: 22 U/L (ref 0–37)
Alkaline Phosphatase: 70 U/L (ref 39–117)
Creatinine, Ser: 1 mg/dL (ref 0.4–1.5)
GFR: 75.9 mL/min (ref >60.00)
Total Bilirubin: 0.3 mg/dL (ref 0.3–1.2)

## 2011-10-18 NOTE — Progress Notes (Signed)
Subjective:    Patient ID: Evan Avery, male    DOB: 1946/08/02, 65 y.o.   MRN: [id number]  HPI 65 year old white male in for hospital follow-up, patient of Dr. Lovell Sheehan. Patient was hospitalized on December 18 with a diagnosis of sepsis. He went into the hospital with decrease level of consciousness, chills, fever, slurred speech and a cough. At that point he was being treated for influenza and possible sepsis. Cultures confirmed sepsis. He is on amoxicillin for 14 days. Swelling much better overall, but continues to have fatigue.   Review of Systems  Constitutional: Positive for fatigue. Negative for fever.  HENT: Negative.   Eyes: Negative.   Respiratory: Negative.   Cardiovascular: Negative.   Gastrointestinal: Negative.   Musculoskeletal: Negative.   Skin: Negative.   Neurological: Negative.   Hematological: Negative.   Psychiatric/Behavioral: Negative.    Past Medical History  Diagnosis Date  . CAD (coronary artery disease)   . Hypertension   . Myocardial infarction   . Diabetes mellitus   . Hyperlipidemia     History   Social History  . Marital Status: Married    Spouse Name: N/A    Number of Children: N/A  . Years of Education: N/A   Occupational History  . Not on file.   Social History Main Topics  . Smoking status: Former Smoker -- 1.0 packs/day    Quit date: 10/11/2011  . Smokeless tobacco: Not on file  . Alcohol Use: Yes  . Drug Use: No  . Sexually Active: Yes   Other Topics Concern  . Not on file   Social History Narrative   Regular exercise    Past Surgical History  Procedure Date  . Stents 2 times for vessel disease-kelly     4 vessels  . Appendectomy     Family History  Problem Relation Age of Onset  . Alzheimer's disease Mother     No Known Allergies  Current Outpatient Prescriptions on File Prior to Visit  Medication Sig Dispense Refill  . albuterol (PROVENTIL HFA;VENTOLIN HFA) 108 (90 BASE) MCG/ACT inhaler Inhale 2  puffs into the lungs every 6 (six) hours as needed for wheezing (Cough).  1 Inhaler  0  . amoxicillin-clavulanate (AUGMENTIN) 875-125 MG per tablet Take 1 tablet by mouth 2 (two) times daily.  28 tablet  0  . aspirin 81 MG tablet Take 81 mg by mouth daily.        Marland Kitchen atenolol (TENORMIN) 50 MG tablet Take 75 mg by mouth daily.       . brimonidine (ALPHAGAN) 0.2 % ophthalmic solution 1 drop 2 (two) times daily.        . clopidogrel (PLAVIX) 75 MG tablet Take 75 mg by mouth daily.        . fexofenadine (ALLEGRA) 180 MG tablet Take 180 mg by mouth daily as needed. For allergy      . guaiFENesin (ROBITUSSIN) 100 MG/5ML liquid Take 10 mLs (200 mg total) by mouth 3 (three) times daily as needed for cough.  120 mL  0  . latanoprost (XALATAN) 0.005 % ophthalmic solution Place 1 drop into both eyes at bedtime.        Marland Kitchen lisinopril (PRINIVIL,ZESTRIL) 2.5 MG tablet Take 2.5 mg by mouth daily.        . simvastatin (ZOCOR) 40 MG tablet Take 40 mg by mouth at bedtime.        . sitaGLIPtan-metformin (JANUMET) 50-500 MG per tablet Take 1 tablet by mouth daily.  BP 130/80  Pulse 61  Temp(Src) 98 F (36.7 C) (Oral)  Wt 253 lb (114.76 kg)chart    Objective:   Physical Exam  Constitutional: He is oriented to person, place, and time.  HENT:  Head: Normocephalic.  Neck: Normal range of motion. Neck supple.  Cardiovascular: Normal rate, regular rhythm and normal heart sounds.   Pulmonary/Chest: Effort normal and breath sounds normal.  Abdominal: Soft. Bowel sounds are normal.  Musculoskeletal: Normal range of motion.  Neurological: He is alert and oriented to person, place, and time.  Skin: Skin is warm and dry.          Assessment & Plan:  Assessment: Sepsis, fatigue  Plan: CBC and CMP sent. Follow the patient in 2 weeks to reevaluate his fatigue and determine if he is able to return to work at that point.patient to call if symptoms worsen or persist recheck in 2 weeks and sooner when  necessary.

## 2011-10-31 ENCOUNTER — Ambulatory Visit (INDEPENDENT_AMBULATORY_CARE_PROVIDER_SITE_OTHER): Payer: BC Managed Care – PPO | Admitting: Family

## 2011-10-31 ENCOUNTER — Encounter: Payer: Self-pay | Admitting: Family

## 2011-10-31 VITALS — BP 122/60 | Temp 98.0°F | Wt 255.0 lb

## 2011-10-31 DIAGNOSIS — R5383 Other fatigue: Secondary | ICD-10-CM

## 2011-10-31 NOTE — Progress Notes (Signed)
Subjective:    Patient ID: Evan Avery, male    DOB: [birth year], 66 y.o.   MRN: [id number]  HPI 66 year old white male, former smoker, patient of Dr. Lovell Sheehan is in for recheck of recovering sepsis and fatigue. He was diagnosed with sepsis 10/11/2011. He has completed all antibiotic therapy and has no signs or symptoms of infection. He continues to have fatigue, which has improved since his last office visit. He is requesting to go back to work on 11/12/2010. All laboratory work has been within normal limits.   Review of Systems  Constitutional: Positive for fatigue.  HENT: Negative.   Eyes: Negative.   Respiratory: Negative.   Cardiovascular: Negative.   Gastrointestinal: Negative.   Genitourinary: Negative.   Musculoskeletal: Negative.   Skin: Negative.   Neurological: Negative.   Hematological: Negative.   Psychiatric/Behavioral: Negative.    Past Medical History  Diagnosis Date  . CAD (coronary artery disease)   . Hypertension   . Myocardial infarction   . Diabetes mellitus   . Hyperlipidemia     History   Social History  . Marital Status: Married    Spouse Name: N/A    Number of Children: N/A  . Years of Education: N/A   Occupational History  . Not on file.   Social History Main Topics  . Smoking status: Former Smoker -- 1.0 packs/day    Quit date: 10/11/2011  . Smokeless tobacco: Not on file  . Alcohol Use: Yes  . Drug Use: No  . Sexually Active: Yes   Other Topics Concern  . Not on file   Social History Narrative   Regular exercise    Past Surgical History  Procedure Date  . Stents 2 times for vessel disease-kelly     4 vessels  . Appendectomy     Family History  Problem Relation Age of Onset  . Alzheimer's disease Mother     No Known Allergies  Current Outpatient Prescriptions on File Prior to Visit  Medication Sig Dispense Refill  . albuterol (PROVENTIL HFA;VENTOLIN HFA) 108 (90 BASE) MCG/ACT inhaler Inhale 2 puffs into the  lungs every 6 (six) hours as needed for wheezing (Cough).  1 Inhaler  0  . aspirin 81 MG tablet Take 81 mg by mouth daily.        Marland Kitchen atenolol (TENORMIN) 50 MG tablet Take 75 mg by mouth daily.       . brimonidine (ALPHAGAN) 0.2 % ophthalmic solution 1 drop 2 (two) times daily.        . clopidogrel (PLAVIX) 75 MG tablet Take 75 mg by mouth daily.        . fexofenadine (ALLEGRA) 180 MG tablet Take 180 mg by mouth daily as needed. For allergy      . latanoprost (XALATAN) 0.005 % ophthalmic solution Place 1 drop into both eyes at bedtime.        Marland Kitchen lisinopril (PRINIVIL,ZESTRIL) 2.5 MG tablet Take 2.5 mg by mouth daily.        . simvastatin (ZOCOR) 40 MG tablet Take 40 mg by mouth at bedtime.        . sitaGLIPtan-metformin (JANUMET) 50-500 MG per tablet Take 1 tablet by mouth daily.          BP 122/60  Temp(Src) 98 F (36.7 C) (Oral)  Wt 255 lb (115.667 kg)chart    Objective:   Physical Exam  Constitutional: He is oriented to person, place, and time. He appears well-developed and well-nourished.  Neck: Normal range  of motion. Neck supple.  Cardiovascular: Normal rate, regular rhythm and normal heart sounds.   Pulmonary/Chest: Effort normal and breath sounds normal.  Abdominal: Soft. Bowel sounds are normal.  Musculoskeletal: Normal range of motion.  Neurological: He is oriented to person, place, and time.  Skin: Skin is warm and dry.  Psychiatric: He has a normal mood and affect.          Assessment & Plan:  Assessment:  Recovering sepsis, fatigue  Plan: In sure that he has a complete physical exam if he has not within the last year. Patient will check on that. Patient will follow up Dr. Lovell Sheehan as scheduled in March. Note given to return back to work on 11/13/2011. If he needs an extension beyond that point, he will return for an office visit for further evaluation. Call the office with any questions or concerns, otherwise we'll check her schedule.

## 2011-12-29 ENCOUNTER — Ambulatory Visit: Payer: BC Managed Care – PPO | Admitting: Internal Medicine

## 2012-02-16 ENCOUNTER — Telehealth: Payer: Self-pay | Admitting: Internal Medicine

## 2012-02-16 MED ORDER — AZITHROMYCIN 250 MG PO TABS: ORAL_TABLET | ORAL | Status: AC

## 2012-02-16 NOTE — Telephone Encounter (Signed)
Pt is complaining of eyes over and under eyes with post nasal drainage.  No fever.  Chest congestion and cough. Has taken Zyrtec with no improvement.

## 2012-02-16 NOTE — Telephone Encounter (Signed)
Pt would like an rx for a sinus infection  [facility] [address] [employer]

## 2012-02-16 NOTE — Telephone Encounter (Signed)
Per dr jenkins-may have z pack 

## 2012-02-16 NOTE — Telephone Encounter (Signed)
Can you triage

## 2012-04-05 ENCOUNTER — Ambulatory Visit: Payer: BC Managed Care – PPO | Admitting: Internal Medicine

## 2012-04-24 ENCOUNTER — Encounter: Payer: Self-pay | Admitting: Internal Medicine

## 2012-04-24 ENCOUNTER — Ambulatory Visit (INDEPENDENT_AMBULATORY_CARE_PROVIDER_SITE_OTHER): Payer: BC Managed Care – PPO | Admitting: Internal Medicine

## 2012-04-24 VITALS — BP 110/70 | HR 72 | Temp 98.6°F | Resp 16 | Ht 69.0 in | Wt 252.0 lb

## 2012-04-24 DIAGNOSIS — E1165 Type 2 diabetes mellitus with hyperglycemia: Secondary | ICD-10-CM

## 2012-04-24 DIAGNOSIS — E1169 Type 2 diabetes mellitus with other specified complication: Secondary | ICD-10-CM

## 2012-04-24 DIAGNOSIS — M25569 Pain in unspecified knee: Secondary | ICD-10-CM

## 2012-04-24 MED ORDER — GLUCOSAMINE CHONDR 500 COMPLEX PO CAPS: 1.0000 | ORAL_CAPSULE | Freq: Three times a day (TID) | ORAL | Status: DC

## 2012-04-24 NOTE — Patient Instructions (Signed)
The patient is instructed to continue all medications as prescribed. Schedule followup with check out clerk upon leaving the clinic  

## 2012-04-24 NOTE — Progress Notes (Signed)
Subjective:    Patient ID: Evan Avery, male    DOB: 1946/04/21, 66 y.o.   MRN: [id number]  HPI Ration had blood work done at the [facility] in [place] and his hemoglobin A1c was 6.9 showing some elevation of blood glucose a fasting glucose was 176 renal function is well-preserved liver functions were normal triglycerides were elevated in correlation to the elevated sugar of 485 LDL cholesterol was 75 and HDL cholesterol was low at 30 He complains of decreased hearing and believes he is wax in his ears.  Has continued to gain weight most of it is central obesity  Review of Systems  Constitutional: Negative for fever and fatigue.  HENT: Negative for hearing loss, congestion, neck pain and postnasal drip.   Eyes: Negative for discharge, redness and visual disturbance.  Respiratory: Negative for cough, shortness of breath and wheezing.   Cardiovascular: Negative for leg swelling.  Gastrointestinal: Negative for abdominal pain, constipation and abdominal distention.  Genitourinary: Negative for urgency and frequency.  Musculoskeletal: Negative for joint swelling and arthralgias.  Skin: Negative for color change and rash.  Neurological: Negative for weakness and light-headedness.  Hematological: Negative for adenopathy.  Psychiatric/Behavioral: Negative for behavioral problems.   Past Medical History  Diagnosis Date  . CAD (coronary artery disease)   . Hypertension   . Myocardial infarction   . Diabetes mellitus   . Hyperlipidemia     History   Social History  . Marital Status: Married    Spouse Name: N/A    Number of Children: N/A  . Years of Education: N/A   Occupational History  . Not on file.   Social History Main Topics  . Smoking status: Former Smoker -- 1.0 packs/day    Quit date: 10/11/2011  . Smokeless tobacco: Not on file  . Alcohol Use: Yes  . Drug Use: No  . Sexually Active: Yes   Other Topics Concern  . Not on file   Social History Narrative     Regular exercise    Past Surgical History  Procedure Date  . Stents 2 times for vessel disease-kelly     4 vessels  . Appendectomy     Family History  Problem Relation Age of Onset  . Alzheimer's disease Mother     No Known Allergies  Current Outpatient Prescriptions on File Prior to Visit  Medication Sig Dispense Refill  . albuterol (PROVENTIL HFA;VENTOLIN HFA) 108 (90 BASE) MCG/ACT inhaler Inhale 2 puffs into the lungs every 6 (six) hours as needed for wheezing (Cough).  1 Inhaler  0  . aspirin 81 MG tablet Take 81 mg by mouth daily.        Marland Kitchen atenolol (TENORMIN) 50 MG tablet Take 75 mg by mouth daily.       . brimonidine (ALPHAGAN) 0.2 % ophthalmic solution 1 drop 2 (two) times daily.        . clopidogrel (PLAVIX) 75 MG tablet Take 75 mg by mouth daily.        . fexofenadine (ALLEGRA) 180 MG tablet Take 180 mg by mouth daily as needed. For allergy      . latanoprost (XALATAN) 0.005 % ophthalmic solution Place 1 drop into both eyes at bedtime.        Marland Kitchen lisinopril (PRINIVIL,ZESTRIL) 2.5 MG tablet Take 2.5 mg by mouth daily.        . simvastatin (ZOCOR) 40 MG tablet Take 40 mg by mouth at bedtime.        . sitaGLIPtan-metformin (  JANUMET) 50-500 MG per tablet Take 1 tablet by mouth daily.          BP 110/70  Pulse 72  Temp 98.6 F (37 C)  Resp 16  Ht 5\' 9"  (1.753 m)  Wt 252 lb (114.306 kg)  BMI 37.21 kg/m2        Objective:   Physical Exam  Nursing note and vitals reviewed. Constitutional: He appears well-developed and well-nourished.  HENT:  Head: Normocephalic and atraumatic.  Eyes: Conjunctivae are normal. Pupils are equal, round, and reactive to light.  Neck: Normal range of motion. Neck supple.  Cardiovascular: Normal rate and regular rhythm.   Pulmonary/Chest: Effort normal and breath sounds normal.  Abdominal: Soft. Bowel sounds are normal.    cerumen impaction      Assessment & Plan:  Patient stable his blood pressure medicines.  His  cardiologist this is just to the spread did Tenormin out and take 25 the morning and 50 at night.  Weight loss is her primary intervention today and we spent 30 minutes face-to-face counseling him about a gluten-free diet to help him with a central weight which is creating a lot of his problems with his diabetes.  He'll continue on his current diabetic medicine if [name] is 6.9 with a goal A1c less than 6 we'll monitor him in 3 months time.  He had bilateral cerumen impaction  Informed consent was obtained and peroxide gel was inserted into the ears bilaterally using the lavage kit the ears were lavaged until clean.Inspection with a cerumen spoon removed residual wax. Patient tolerated the procedure well.

## 2012-04-26 ENCOUNTER — Ambulatory Visit: Payer: BC Managed Care – PPO | Admitting: Internal Medicine

## 2012-05-02 ENCOUNTER — Ambulatory Visit: Payer: BC Managed Care – PPO | Admitting: Internal Medicine

## 2012-07-04 ENCOUNTER — Other Ambulatory Visit: Payer: Self-pay | Admitting: Orthopedic Surgery

## 2012-07-04 DIAGNOSIS — M25562 Pain in left knee: Secondary | ICD-10-CM

## 2012-07-05 ENCOUNTER — Ambulatory Visit
Admission: RE | Admit: 2012-07-05 | Discharge: 2012-07-05 | Disposition: A | Discharge status: Home / Self Care | Payer: BC Managed Care – PPO | Source: Ambulatory Visit | Attending: Orthopedic Surgery | Admitting: Orthopedic Surgery

## 2012-07-05 DIAGNOSIS — M25562 Pain in left knee: Secondary | ICD-10-CM

## 2012-07-24 ENCOUNTER — Encounter (HOSPITAL_BASED_OUTPATIENT_CLINIC_OR_DEPARTMENT_OTHER): Payer: Self-pay | Admitting: *Deleted

## 2012-07-24 NOTE — Progress Notes (Addendum)
Bring all medications. Bring CPAP machine. Coming tomorrow for BMET  Sometime after 3pm. Requested last office notes and any cardiac testing from Dr. Nicki Guadalajara( cardiologist).

## 2012-07-25 ENCOUNTER — Encounter (HOSPITAL_BASED_OUTPATIENT_CLINIC_OR_DEPARTMENT_OTHER)
Admission: RE | Admit: 2012-07-25 | Discharge: 2012-07-25 | Disposition: A | Discharge status: Home / Self Care | Payer: BC Managed Care – PPO | Source: Ambulatory Visit | Attending: Orthopedic Surgery | Admitting: Orthopedic Surgery

## 2012-07-25 LAB — BASIC METABOLIC PANEL
BUN: 12 mg/dL (ref 6–23)
CO2: 25 mEq/L (ref 19–32)
Chloride: 101 mEq/L (ref 96–112)
Creatinine, Ser: 0.89 mg/dL (ref 0.50–1.35)
Glucose, Bld: 192 mg/dL — ABNORMAL HIGH (ref 70–99)

## 2012-07-25 NOTE — Progress Notes (Signed)
Chart reviewed by Dr Sampson Goon, pt needs cardiac clearance by Dr Tresa Endo before surgery. Dr Landry Dyke office notified, spoke with RN, she will fax clearance note.

## 2012-07-26 ENCOUNTER — Ambulatory Visit (HOSPITAL_BASED_OUTPATIENT_CLINIC_OR_DEPARTMENT_OTHER)
Admission: RE | Admit: 2012-07-26 | Discharge: 2012-07-26 | Disposition: A | Discharge status: Home / Self Care | Payer: BC Managed Care – PPO | Source: Ambulatory Visit | Attending: Orthopedic Surgery | Admitting: Orthopedic Surgery

## 2012-07-26 ENCOUNTER — Ambulatory Visit (HOSPITAL_BASED_OUTPATIENT_CLINIC_OR_DEPARTMENT_OTHER): Payer: BC Managed Care – PPO | Admitting: Anesthesiology

## 2012-07-26 ENCOUNTER — Encounter (HOSPITAL_BASED_OUTPATIENT_CLINIC_OR_DEPARTMENT_OTHER)
Admission: RE | Disposition: A | Discharge status: Home / Self Care | Payer: Self-pay | Source: Ambulatory Visit | Attending: Orthopedic Surgery

## 2012-07-26 ENCOUNTER — Encounter (HOSPITAL_BASED_OUTPATIENT_CLINIC_OR_DEPARTMENT_OTHER): Payer: Self-pay | Admitting: *Deleted

## 2012-07-26 ENCOUNTER — Encounter (HOSPITAL_BASED_OUTPATIENT_CLINIC_OR_DEPARTMENT_OTHER): Payer: Self-pay | Admitting: Anesthesiology

## 2012-07-26 ENCOUNTER — Encounter (HOSPITAL_BASED_OUTPATIENT_CLINIC_OR_DEPARTMENT_OTHER): Payer: Self-pay | Admitting: Orthopedic Surgery

## 2012-07-26 DIAGNOSIS — G4730 Sleep apnea, unspecified: Secondary | ICD-10-CM | POA: Insufficient documentation

## 2012-07-26 DIAGNOSIS — Z01812 Encounter for preprocedural laboratory examination: Secondary | ICD-10-CM | POA: Insufficient documentation

## 2012-07-26 DIAGNOSIS — M9420 Chondromalacia, unspecified site: Secondary | ICD-10-CM | POA: Insufficient documentation

## 2012-07-26 DIAGNOSIS — I10 Essential (primary) hypertension: Secondary | ICD-10-CM | POA: Insufficient documentation

## 2012-07-26 DIAGNOSIS — I252 Old myocardial infarction: Secondary | ICD-10-CM | POA: Insufficient documentation

## 2012-07-26 DIAGNOSIS — M23329 Other meniscus derangements, posterior horn of medial meniscus, unspecified knee: Secondary | ICD-10-CM | POA: Insufficient documentation

## 2012-07-26 DIAGNOSIS — S83232A Complex tear of medial meniscus, current injury, left knee, initial encounter: Secondary | ICD-10-CM

## 2012-07-26 DIAGNOSIS — E119 Type 2 diabetes mellitus without complications: Secondary | ICD-10-CM | POA: Insufficient documentation

## 2012-07-26 DIAGNOSIS — I2510 Atherosclerotic heart disease of native coronary artery without angina pectoris: Secondary | ICD-10-CM | POA: Insufficient documentation

## 2012-07-26 HISTORY — DX: Sleep apnea, unspecified: G47.30

## 2012-07-26 HISTORY — DX: Complex tear of medial meniscus, current injury, left knee, initial encounter: S83.232A

## 2012-07-26 SURGERY — ARTHROSCOPY, KNEE, WITH MEDIAL MENISCECTOMY
Anesthesia: General | Site: Knee | Laterality: Left | Wound class: Clean

## 2012-07-26 MED ORDER — DEXAMETHASONE SODIUM PHOSPHATE 4 MG/ML IJ SOLN
INTRAMUSCULAR | Status: DC | PRN
  Administered 2012-07-26: 5 mg via INTRAVENOUS

## 2012-07-26 MED ORDER — OXYCODONE HCL 5 MG PO TABS
5.0000 mg | ORAL_TABLET | Freq: Once | ORAL | Status: DC | PRN
Start: 2012-07-26 — End: 2012-07-26

## 2012-07-26 MED ORDER — METHOCARBAMOL 500 MG PO TABS: 500.0000 mg | ORAL_TABLET | Freq: Four times a day (QID) | ORAL | Status: DC

## 2012-07-26 MED ORDER — LACTATED RINGERS IV SOLN
INTRAVENOUS | Status: DC
  Administered 2012-07-26 (×2): via INTRAVENOUS

## 2012-07-26 MED ORDER — ONDANSETRON HCL 4 MG/2ML IJ SOLN
INTRAMUSCULAR | Status: DC | PRN
  Administered 2012-07-26: 4 mg via INTRAVENOUS

## 2012-07-26 MED ORDER — LIDOCAINE HCL (CARDIAC) 20 MG/ML IV SOLN
INTRAVENOUS | Status: DC | PRN
  Administered 2012-07-26: 60 mg via INTRAVENOUS

## 2012-07-26 MED ORDER — CEFAZOLIN SODIUM 1-5 GM-% IV SOLN
INTRAVENOUS | Status: DC | PRN
  Administered 2012-07-26: 2 g via INTRAVENOUS

## 2012-07-26 MED ORDER — SODIUM CHLORIDE 0.9 % IR SOLN
Status: DC | PRN
  Administered 2012-07-26: 6000 mL

## 2012-07-26 MED ORDER — ACETAMINOPHEN 10 MG/ML IV SOLN
1000.0000 mg | Freq: Once | INTRAVENOUS | Status: AC
  Administered 2012-07-26: 1000 mg via INTRAVENOUS

## 2012-07-26 MED ORDER — OXYCODONE HCL 5 MG/5ML PO SOLN: 5.0000 mg | Freq: Once | ORAL | Status: DC | PRN

## 2012-07-26 MED ORDER — BUPIVACAINE HCL 0.25 % IJ SOLN
INTRAMUSCULAR | Status: DC | PRN
Start: 2012-07-26 — End: 2012-07-26
  Administered 2012-07-26: 20 mL

## 2012-07-26 MED ORDER — MIDAZOLAM HCL 5 MG/5ML IJ SOLN
INTRAMUSCULAR | Status: DC | PRN
  Administered 2012-07-26: 2 mg via INTRAVENOUS

## 2012-07-26 MED ORDER — KETOROLAC TROMETHAMINE 30 MG/ML IJ SOLN
INTRAMUSCULAR | Status: DC | PRN
  Administered 2012-07-26: 30 mg via INTRAVENOUS

## 2012-07-26 MED ORDER — HYDROMORPHONE HCL PF 1 MG/ML IJ SOLN
0.2500 mg | INTRAMUSCULAR | Status: DC | PRN
  Administered 2012-07-26 (×4): 0.5 mg via INTRAVENOUS

## 2012-07-26 MED ORDER — PROPOFOL 10 MG/ML IV BOLUS
INTRAVENOUS | Status: DC | PRN
  Administered 2012-07-26: 200 mg via INTRAVENOUS

## 2012-07-26 MED ORDER — SENNA-DOCUSATE SODIUM 8.6-50 MG PO TABS: 1.0000 | ORAL_TABLET | Freq: Every day | ORAL | Status: DC

## 2012-07-26 MED ORDER — OXYCODONE-ACETAMINOPHEN 5-325 MG PO TABS: 1.0000 | ORAL_TABLET | Freq: Four times a day (QID) | ORAL | Status: DC | PRN

## 2012-07-26 MED ORDER — PROMETHAZINE HCL 25 MG PO TABS: 25.0000 mg | ORAL_TABLET | Freq: Four times a day (QID) | ORAL | Status: DC | PRN

## 2012-07-26 MED ORDER — FENTANYL CITRATE 0.05 MG/ML IJ SOLN
INTRAMUSCULAR | Status: DC | PRN
  Administered 2012-07-26 (×2): 50 ug via INTRAVENOUS

## 2012-07-26 SURGICAL SUPPLY — 46 items
BANDAGE ELASTIC 6 VELCRO ST LF (GAUZE/BANDAGES/DRESSINGS) ×2 IMPLANT
BANDAGE ESMARK 6X9 LF (GAUZE/BANDAGES/DRESSINGS) IMPLANT
BENZOIN TINCTURE PRP APPL 2/3 (GAUZE/BANDAGES/DRESSINGS) ×2 IMPLANT
BLADE CUDA 5.5 (BLADE) IMPLANT
BLADE CUDA GRT WHITE 3.5 (BLADE) IMPLANT
BLADE CUDA SHAVER 3.5 (BLADE) IMPLANT
BLADE CUTTER GATOR 3.5 (BLADE) ×2 IMPLANT
BLADE CUTTER MENIS 5.5 (BLADE) IMPLANT
BLADE GREAT WHITE 4.2 (BLADE) IMPLANT
BNDG ESMARK 6X9 LF (GAUZE/BANDAGES/DRESSINGS)
CANISTER OMNI JUG 16 LITER (MISCELLANEOUS) ×2 IMPLANT
CANISTER SUCTION 2500CC (MISCELLANEOUS) IMPLANT
CLOTH BEACON ORANGE TIMEOUT ST (SAFETY) ×2 IMPLANT
CUFF TOURNIQUET SINGLE 34IN LL (TOURNIQUET CUFF) IMPLANT
CUTTER KNOT PUSHER 2-0 FIBERWI (INSTRUMENTS) IMPLANT
CUTTER MENISCUS  4.2MM (BLADE)
CUTTER MENISCUS 4.2MM (BLADE) IMPLANT
DRAPE ARTHROSCOPY W/POUCH 90 (DRAPES) ×2 IMPLANT
DURAPREP 26ML APPLICATOR (WOUND CARE) ×2 IMPLANT
ELECT MENISCUS 165MM 90D (ELECTRODE) IMPLANT
ELECT REM PT RETURN 9FT ADLT (ELECTROSURGICAL)
ELECTRODE REM PT RTRN 9FT ADLT (ELECTROSURGICAL) IMPLANT
GLOVE BIO SURGEON STRL SZ8 (GLOVE) ×4 IMPLANT
GLOVE BIOGEL M STRL SZ7.5 (GLOVE) ×2 IMPLANT
GLOVE BIOGEL PI IND STRL 8 (GLOVE) ×3 IMPLANT
GLOVE BIOGEL PI INDICATOR 8 (GLOVE) ×3
GLOVE ORTHO TXT STRL SZ7.5 (GLOVE) ×2 IMPLANT
GOWN BRE IMP PREV XXLGXLNG (GOWN DISPOSABLE) ×4 IMPLANT
GOWN PREVENTION PLUS XXLARGE (GOWN DISPOSABLE) ×2 IMPLANT
HOLDER KNEE FOAM BLUE (MISCELLANEOUS) ×2 IMPLANT
KNEE WRAP E Z 3 GEL PACK (MISCELLANEOUS) ×2 IMPLANT
NEEDLE MENISCAL REPAIR DBL ARM (NEEDLE) IMPLANT
NEEDLE MENISCAL REPAIR W/EYELT (NEEDLE) IMPLANT
PACK ARTHROSCOPY DSU (CUSTOM PROCEDURE TRAY) ×2 IMPLANT
PACK BASIN DAY SURGERY FS (CUSTOM PROCEDURE TRAY) ×2 IMPLANT
PENCIL BUTTON HOLSTER BLD 10FT (ELECTRODE) IMPLANT
SET ARTHROSCOPY TUBING (MISCELLANEOUS) ×1
SET ARTHROSCOPY TUBING LN (MISCELLANEOUS) ×1 IMPLANT
SLEEVE SCD COMPRESS KNEE MED (MISCELLANEOUS) ×2 IMPLANT
SPONGE GAUZE 4X4 12PLY (GAUZE/BANDAGES/DRESSINGS) ×2 IMPLANT
STRIP CLOSURE SKIN 1/2X4 (GAUZE/BANDAGES/DRESSINGS) ×2 IMPLANT
SUT MNCRL AB 4-0 PS2 18 (SUTURE) ×2 IMPLANT
TOWEL OR 17X24 6PK STRL BLUE (TOWEL DISPOSABLE) ×2 IMPLANT
TOWEL OR NON WOVEN STRL DISP B (DISPOSABLE) ×2 IMPLANT
WAND STAR VAC 90 (SURGICAL WAND) IMPLANT
WATER STERILE IRR 1000ML POUR (IV SOLUTION) ×2 IMPLANT

## 2012-07-26 NOTE — Anesthesia Postprocedure Evaluation (Signed)
  Anesthesia Post-op Note  Patient: Evan Avery  Procedure(s) Performed: Procedure(s) (LRB) with comments: KNEE ARTHROSCOPY WITH MEDIAL MENISECTOMY (Left) - left knee arthroscopy with partial menisectomy medial   Patient Location: PACU  Anesthesia Type: General  Level of Consciousness: awake, alert  and oriented  Airway and Oxygen Therapy: Patient Spontanous Breathing  Post-op Pain: mild  Post-op Assessment: Post-op Vital signs reviewed, Patient's Cardiovascular Status Stable, Respiratory Function Stable, Patent Airway and No signs of Nausea or vomiting  Post-op Vital Signs: Reviewed and stable  Complications: No apparent anesthesia complications

## 2012-07-26 NOTE — Op Note (Signed)
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  ANESTHESIA:   General  PREOPERATIVE INDICATIONS:  [name] is a  66 y.o. male with a diagnosis of dislocation of knee,tear of medial cartilage or meniscus of knee  who failed conservative measures and elected for surgical management.    The risks benefits and alternatives were discussed with the patient preoperatively including but not limited to the risks of infection, bleeding, nerve injury, cardiopulmonary complications, the need for revision surgery, among others, and the patient was willing to proceed. We also discussed the limitations of arthroscopy in the face of early degenerative changes, and he understood and was willing to proceed. We also recommended weight loss, in order to optimize his knee function.  OPERATIVE IMPLANTS: None  OPERATIVE FINDINGS: The patellofemoral joint had a fairly diffuse area of grade 3 changes with near full-thickness chondral loss. The medial femoral condyle had extensive uncontained grade 3 changes as well. There is no raw bone. The tibial condyle and medial compartment had grade 2 changes, with a small area of grade 3. The medial meniscus had a complex tear from the posterior horn around the midsubstance of the body. The anterior cruciate ligament and PCL were intact. The lateral compartment had some fraying of the central portion of the  lateral meniscus, but otherwise was intact, although the cartilage in the lateral compartment had a focal area of grade 3 changes. Otherwise grade 2. Overall this was a fairly significant degenerative knee, although not end-stage. This is diffuse, involving all the compartments. I do not think that he would be a partial knee candidate based on the quality of the cartilage in the patellofemoral joint as well as the lateral compartment.  OPERATIVE PROCEDURE: The patient is brought to the operating room placed in supine position. General anesthesia was administered. The left lower tremors he was prepped and draped in usual sterile fashion. Time out was performed. Diagnostic arthroscopy was carried out the above-named findings. The arthroscopic shaver was used to debride the complex tear of the medial meniscus. Light chondroplasty was performed on the patellofemoral joint as well as in the medial compartment. I did debride the lateral meniscus gently, but really just removing some of the loose fronds in the central region.  Once complete arthroscopy at then achieved I irrigated the knee and removed the instruments and closed the portals with Monocryl Steri-Strips and sterile gauze. He was awakened and returned back in stable and satisfactory condition. No complications.

## 2012-07-26 NOTE — Anesthesia Preprocedure Evaluation (Signed)
Anesthesia Evaluation  Patient identified by MRN, date of birth, ID band Patient awake    Reviewed: Allergy & Precautions, H&P , NPO status , Patient's Chart, lab work & pertinent test results, reviewed documented beta blocker date and time   Airway Mallampati: III TM Distance: >3 FB Neck ROM: Full    Dental No notable dental hx. (+) Teeth Intact, Partial Upper and Dental Advisory Given   Pulmonary sleep apnea and Continuous Positive Airway Pressure Ventilation ,  breath sounds clear to auscultation  Pulmonary exam normal       Cardiovascular hypertension, On Home Beta Blockers + CAD, + Past MI and + Cardiac Stents Rhythm:Regular Rate:Normal     Neuro/Psych PSYCHIATRIC DISORDERS negative neurological ROS     GI/Hepatic negative GI ROS, Neg liver ROS,   Endo/Other  diabetes, Type 2, Oral Hypoglycemic Agents  Renal/GU negative Renal ROS  negative genitourinary   Musculoskeletal   Abdominal   Peds  Hematology negative hematology ROS (+)   Anesthesia Other Findings   Reproductive/Obstetrics negative OB ROS                           Anesthesia Physical Anesthesia Plan  ASA: III  Anesthesia Plan: General   Post-op Pain Management:    Induction: Intravenous  Airway Management Planned: LMA  Additional Equipment:   Intra-op Plan:   Post-operative Plan: Extubation in OR  Informed Consent: I have reviewed the patients History and Physical, chart, labs and discussed the procedure including the risks, benefits and alternatives for the proposed anesthesia with the patient or authorized representative who has indicated his/her understanding and acceptance.   Dental advisory given  Plan Discussed with: CRNA and Anesthesiologist  Anesthesia Plan Comments:         Anesthesia Quick Evaluation

## 2012-07-26 NOTE — H&P (Signed)
PREOPERATIVE H&P  Chief Complaint: dislocation of knee,tear of medial cartilage or meniscus of knee   HPI: Evan Avery is a 66 y.o. male who presents for preoperative history and physical with a diagnosis of dislocation of knee,tear of medial cartilage or meniscus of knee . Symptoms are rated as moderate to severe, and have been worsening.  This is significantly impairing activities of daily living.  He has elected for surgical management. He has failed injections, activity modification, exercises, among others.  Past Medical History  Diagnosis Date  . Hypertension   . Diabetes mellitus   . Hyperlipidemia   . CAD (coronary artery disease)     4 stents in right artery  . Myocardial infarction     1991  . Sleep apnea     uses CPAP regularly   Past Surgical History  Procedure Date  . Stents 2 times for vessel disease-kelly     4 vessels  . Appendectomy     at age 22   History   Social History  . Marital Status: Married    Spouse Name: N/A    Number of Children: N/A  . Years of Education: N/A   Social History Main Topics  . Smoking status: Current Every Day Smoker -- 1.0 packs/day    Types: Cigarettes    Last Attempt to Quit: 10/11/2011  . Smokeless tobacco: None  . Alcohol Use: Yes     drinks a beer or wine once a year  . Drug Use: No  . Sexually Active: Yes     smokes 1 pack a day of cigarettes   Other Topics Concern  . None   Social History Narrative   Regular exercise   Family History  Problem Relation Age of Onset  . Alzheimer's disease Mother    No Known Allergies Prior to Admission medications   Medication Sig Start Date End Date Taking? Authorizing Provider  aspirin 81 MG tablet Take 81 mg by mouth daily.     Yes Historical Provider, MD  atenolol (TENORMIN) 50 MG tablet Take 75 mg by mouth daily.    Yes Historical Provider, MD  brimonidine (ALPHAGAN) 0.2 % ophthalmic solution 1 drop 2 (two) times daily.    Yes Historical Provider, MD  clopidogrel  (PLAVIX) 75 MG tablet Take 75 mg by mouth daily.     Yes Historical Provider, MD  fexofenadine (ALLEGRA) 180 MG tablet Take 180 mg by mouth daily as needed. For allergy   Yes Historical Provider, MD  Glucosamine-Chondroit-Vit C-Mn (GLUCOSAMINE CHONDR 500 COMPLEX) CAPS Take 1 capsule by mouth 3 (three) times daily. 04/24/12  Yes Stacie Glaze, MD  latanoprost (XALATAN) 0.005 % ophthalmic solution Place 1 drop into both eyes at bedtime.    Yes Historical Provider, MD  lisinopril (PRINIVIL,ZESTRIL) 2.5 MG tablet Take 2.5 mg by mouth daily.     Yes Historical Provider, MD  Multiple Vitamin (MULTIVITAMIN) tablet Take 1 tablet by mouth daily.   Yes Historical Provider, MD  simvastatin (ZOCOR) 40 MG tablet Take 40 mg by mouth at bedtime.     Yes Historical Provider, MD  sitaGLIPtan-metformin (JANUMET) 50-500 MG per tablet Take 1 tablet by mouth daily.     Yes Historical Provider, MD  albuterol (PROVENTIL HFA;VENTOLIN HFA) 108 (90 BASE) MCG/ACT inhaler Inhale 2 puffs into the lungs every 6 (six) hours as needed for wheezing (Cough). 10/13/11 10/12/12  Stephani Police, PA     Positive ROS: All other systems have been reviewed and were  otherwise negative with the exception of those mentioned in the HPI and as above.  Physical Exam: General: Alert, no acute distress. Moderately obese. Cardiovascular: No pedal edema Respiratory: No cyanosis, no use of accessory musculature GI: No organomegaly, abdomen is soft and non-tender Skin: No lesions in the area of chief complaint Neurologic: Sensation intact distally Psychiatric: Patient is competent for consent with normal mood and affect Lymphatic: No axillary or cervical lymphadenopathy  MUSCULOSKELETAL: Left knee has positive medial joint line tenderness. Minimal effusion. Stable to varus and valgus stress.  Assessment: tear of medial cartilage or meniscus of knee   Plan: Plan for Procedure(s): KNEE ARTHROSCOPY WITH MEDIAL MENISECTOMY  The risks  benefits and alternatives were discussed with the patient including but not limited to the risks of nonoperative treatment, versus surgical intervention including infection, bleeding, nerve injury,  blood clots, cardiopulmonary complications, morbidity, mortality, among others, and they were willing to proceed.   Fahim Kats P, MD Cell [phone] Pager [phone]  07/26/2012 7:38 AM

## 2012-07-26 NOTE — Transfer of Care (Signed)
Immediate Anesthesia Transfer of Care Note  Patient: Evan Avery  Procedure(s) Performed: Procedure(s) (LRB) with comments: KNEE ARTHROSCOPY WITH MEDIAL MENISECTOMY (Left) - left knee arthroscopy with partial menisectomy medial   Patient Location: PACU  Anesthesia Type: General  Level of Consciousness: sedated  Airway & Oxygen Therapy: Patient Spontanous Breathing and Patient connected to face mask oxygen  Post-op Assessment: Report given to PACU RN and Post -op Vital signs reviewed and stable  Post vital signs: Reviewed and stable  Complications: No apparent anesthesia complications

## 2012-08-02 ENCOUNTER — Ambulatory Visit (INDEPENDENT_AMBULATORY_CARE_PROVIDER_SITE_OTHER): Payer: BC Managed Care – PPO | Admitting: Internal Medicine

## 2012-08-02 ENCOUNTER — Encounter: Payer: Self-pay | Admitting: Internal Medicine

## 2012-08-02 VITALS — BP 132/78 | HR 72 | Temp 98.6°F | Resp 16 | Ht 69.0 in | Wt 242.0 lb

## 2012-08-02 DIAGNOSIS — E785 Hyperlipidemia, unspecified: Secondary | ICD-10-CM

## 2012-08-02 DIAGNOSIS — IMO0002 Reserved for concepts with insufficient information to code with codable children: Secondary | ICD-10-CM

## 2012-08-02 DIAGNOSIS — E1165 Type 2 diabetes mellitus with hyperglycemia: Secondary | ICD-10-CM

## 2012-08-02 DIAGNOSIS — Z23 Encounter for immunization: Secondary | ICD-10-CM

## 2012-08-02 DIAGNOSIS — I10 Essential (primary) hypertension: Secondary | ICD-10-CM

## 2012-08-02 NOTE — Patient Instructions (Signed)
The patient is instructed to continue all medications as prescribed. Schedule followup with check out clerk upon leaving the clinic  

## 2012-08-02 NOTE — Progress Notes (Signed)
Subjective:    Patient ID: Evan Avery, male    DOB: Nov 29, 1945, 66 y.o.   MRN: [id number]  HPI Patient had arthroscopy on October 4 to the left knee.  He is doing well post surgery treating the swelling with ice and trying to remain off as much as possible for the two-week period. He brings with him lab work from the [place] which showed cholesterol 181 which was excellent liver functions were normal triglycerides were elevated however at 458 and blood sugar was 126 LDL direct was 75   Review of Systems  Constitutional: Negative for fever and fatigue.  HENT: Negative for hearing loss, congestion, neck pain and postnasal drip.   Eyes: Negative for discharge, redness and visual disturbance.  Respiratory: Negative for cough, shortness of breath and wheezing.   Cardiovascular: Negative for leg swelling.  Gastrointestinal: Negative for abdominal pain, constipation and abdominal distention.  Genitourinary: Negative for urgency and frequency.  Musculoskeletal: Positive for myalgias, joint swelling, arthralgias and gait problem.  Skin: Negative for color change and rash.  Neurological: Negative for weakness and light-headedness.  Hematological: Negative for adenopathy.  Psychiatric/Behavioral: Negative for behavioral problems.   Past Medical History  Diagnosis Date  . Hypertension   . Diabetes mellitus   . Hyperlipidemia   . CAD (coronary artery disease)     4 stents in right artery  . Myocardial infarction     1991  . Sleep apnea     uses CPAP regularly  . Complex tear of medial meniscus of left knee as current injury 07/26/2012    History   Social History  . Marital Status: Married    Spouse Name: N/A    Number of Children: N/A  . Years of Education: N/A   Occupational History  . Not on file.   Social History Main Topics  . Smoking status: Current Every Day Smoker -- 1.0 packs/day    Types: Cigarettes    Last Attempt to Quit: 10/11/2011  . Smokeless tobacco: Not on  file  . Alcohol Use: Yes     drinks a beer or wine once a year  . Drug Use: No  . Sexually Active: Yes     smokes 1 pack a day of cigarettes   Other Topics Concern  . Not on file   Social History Narrative   Regular exercise    Past Surgical History  Procedure Date  . Stents 2 times for vessel disease-kelly     4 vessels  . Appendectomy     at age 6    Family History  Problem Relation Age of Onset  . Alzheimer's disease Mother     No Known Allergies  Current Outpatient Prescriptions on File Prior to Visit  Medication Sig Dispense Refill  . albuterol (PROVENTIL HFA;VENTOLIN HFA) 108 (90 BASE) MCG/ACT inhaler Inhale 2 puffs into the lungs every 6 (six) hours as needed for wheezing (Cough).  1 Inhaler  0  . aspirin 81 MG tablet Take 81 mg by mouth daily.        Marland Kitchen atenolol (TENORMIN) 50 MG tablet Take 75 mg by mouth daily.       . brimonidine (ALPHAGAN) 0.2 % ophthalmic solution 1 drop 2 (two) times daily.       . clopidogrel (PLAVIX) 75 MG tablet Take 75 mg by mouth daily.        . fexofenadine (ALLEGRA) 180 MG tablet Take 180 mg by mouth daily as needed. For allergy      .  Glucosamine-Chondroit-Vit C-Mn (GLUCOSAMINE CHONDR 500 COMPLEX) CAPS Take 1 capsule by mouth 3 (three) times daily.  90 capsule  11  . latanoprost (XALATAN) 0.005 % ophthalmic solution Place 1 drop into both eyes at bedtime.       Marland Kitchen lisinopril (PRINIVIL,ZESTRIL) 2.5 MG tablet Take 2.5 mg by mouth daily.        . methocarbamol (ROBAXIN) 500 MG tablet Take 1 tablet (500 mg total) by mouth 4 (four) times daily.  75 tablet  1  . Multiple Vitamin (MULTIVITAMIN) tablet Take 1 tablet by mouth daily.      Marland Kitchen oxyCODONE-acetaminophen (ROXICET) 5-325 MG per tablet Take 1-2 tablets by mouth every 6 (six) hours as needed for pain.  75 tablet  0  . promethazine (PHENERGAN) 25 MG tablet Take 1 tablet (25 mg total) by mouth every 6 (six) hours as needed for nausea.  30 tablet  0  . simvastatin (ZOCOR) 40 MG tablet Take 40  mg by mouth at bedtime.        . sitaGLIPtan-metformin (JANUMET) 50-500 MG per tablet Take 1 tablet by mouth daily.          BP 132/78  Pulse 72  Temp 98.6 F (37 C)  Resp 16  Ht 5\' 9"  (1.753 m)  Wt 242 lb (109.77 kg)  BMI 35.74 kg/m2       Objective:   Physical Exam  Nursing note and vitals reviewed. Constitutional: He appears well-developed and well-nourished.  HENT:  Head: Normocephalic and atraumatic.  Eyes: Conjunctivae normal are normal. Pupils are equal, round, and reactive to light.  Neck: Normal range of motion. Neck supple.  Cardiovascular: Normal rate and regular rhythm.   Pulmonary/Chest: Effort normal and breath sounds normal.  Abdominal: Soft. Bowel sounds are normal.  Musculoskeletal: He exhibits edema and tenderness.          Assessment & Plan:  His cholesterol is in excellent control but his triglycerides and blood sugars could be tighter control we will increase the Tagamet to twice daily.  Blood pressure appears to be stable on low-dose Prinivil he is on Plavix and Tenormin per cardiology  Will increase the janumet 50/500 BID

## 2012-10-30 ENCOUNTER — Telehealth: Payer: Self-pay | Admitting: Internal Medicine

## 2012-10-30 MED ORDER — AZITHROMYCIN 250 MG PO TABS: ORAL_TABLET | ORAL | Status: DC

## 2012-10-30 NOTE — Telephone Encounter (Signed)
Per dr jenkins-may have z pack- pt informed 

## 2012-10-30 NOTE — Telephone Encounter (Signed)
Pt called Triage, but missed their call back. He wants an abx for sinus infection. [facility] [address] in [place]. Please advise & call pt.

## 2012-11-18 ENCOUNTER — Telehealth: Payer: Self-pay | Admitting: Internal Medicine

## 2012-11-18 NOTE — Telephone Encounter (Signed)
Left message on machine Absolutely nothing available- wished he hadnt cx appointment - can come in and see another provider

## 2012-11-18 NOTE — Telephone Encounter (Signed)
Pt had an appt scheduled 12/06/12 and states he needs to see PCP before then to discuss diabetes. Pt cancelled appt on 2/14 and requested to be worked in sooner, this week or next week if possible.  Pt declined to see another provider.

## 2012-11-19 ENCOUNTER — Telehealth: Payer: Self-pay | Admitting: Internal Medicine

## 2012-11-19 NOTE — Telephone Encounter (Signed)
Patient called stating that he need samples of jamumet 50 mg. Please assist.

## 2012-11-20 ENCOUNTER — Telehealth: Payer: Self-pay | Admitting: *Deleted

## 2012-11-20 NOTE — Telephone Encounter (Signed)
Samples up front Needs notes stating that he was dm-first diagnosed

## 2012-11-20 NOTE — Telephone Encounter (Signed)
Samples up front 

## 2012-12-06 ENCOUNTER — Ambulatory Visit: Payer: BC Managed Care – PPO | Admitting: Internal Medicine

## 2013-01-06 ENCOUNTER — Encounter: Payer: Self-pay | Admitting: Internal Medicine

## 2013-01-06 ENCOUNTER — Ambulatory Visit (INDEPENDENT_AMBULATORY_CARE_PROVIDER_SITE_OTHER): Payer: BC Managed Care – PPO | Admitting: Internal Medicine

## 2013-01-06 VITALS — BP 150/90 | HR 74 | Temp 97.9°F | Wt 250.0 lb

## 2013-01-06 DIAGNOSIS — I10 Essential (primary) hypertension: Secondary | ICD-10-CM

## 2013-01-06 DIAGNOSIS — E785 Hyperlipidemia, unspecified: Secondary | ICD-10-CM

## 2013-01-06 DIAGNOSIS — E119 Type 2 diabetes mellitus without complications: Secondary | ICD-10-CM

## 2013-01-06 DIAGNOSIS — I2510 Atherosclerotic heart disease of native coronary artery without angina pectoris: Secondary | ICD-10-CM

## 2013-01-06 LAB — BASIC METABOLIC PANEL
BUN: 14 mg/dL (ref 6–23)
CO2: 26 mEq/L (ref 19–32)
Chloride: 101 mEq/L (ref 96–112)
GFR: 80.98 mL/min (ref >60.00)
Glucose, Bld: 186 mg/dL — ABNORMAL HIGH (ref 70–99)
Potassium: 4.4 mEq/L (ref 3.5–5.1)
Sodium: 136 mEq/L (ref 135–145)

## 2013-01-06 LAB — LIPID PANEL
HDL: 32.3 mg/dL — ABNORMAL LOW (ref >39.00)
Total CHOL/HDL Ratio: 4
VLDL: 56.2 mg/dL — ABNORMAL HIGH (ref 0.0–40.0)

## 2013-01-06 NOTE — Patient Instructions (Addendum)
The patient is instructed to continue all medications as prescribed. Schedule followup with check out clerk upon leaving the clinic  

## 2013-01-06 NOTE — Progress Notes (Signed)
Subjective:    Patient ID: Evan Avery, male    DOB: 1946/09/18, 67 y.o.   MRN: [id number]  HPI Stable HTN Diabetes monitor a1c Chronic low back pain Hx of CAD Monitoring on liptors for lipids  Review of Systems  Constitutional: Negative for fever and fatigue.  HENT: Negative for hearing loss, congestion, neck pain and postnasal drip.   Eyes: Negative for discharge, redness and visual disturbance.  Respiratory: Negative for cough, shortness of breath and wheezing.   Cardiovascular: Negative for leg swelling.  Gastrointestinal: Negative for abdominal pain, constipation and abdominal distention.  Genitourinary: Negative for urgency and frequency.  Musculoskeletal: Negative for joint swelling and arthralgias.  Skin: Negative for color change and rash.  Neurological: Negative for weakness and light-headedness.  Hematological: Negative for adenopathy.  Psychiatric/Behavioral: Negative for behavioral problems.   Past Medical History  Diagnosis Date  . Hypertension   . Diabetes mellitus   . Hyperlipidemia   . CAD (coronary artery disease)     4 stents in right artery  . Myocardial infarction     1991  . Sleep apnea     uses CPAP regularly  . Complex tear of medial meniscus of left knee as current injury 07/26/2012    History   Social History  . Marital Status: Married    Spouse Name: N/A    Number of Children: N/A  . Years of Education: N/A   Occupational History  . Not on file.   Social History Main Topics  . Smoking status: Current Every Day Smoker -- 1.00 packs/day    Types: Cigarettes    Last Attempt to Quit: 10/11/2011  . Smokeless tobacco: Not on file  . Alcohol Use: Yes     Comment: drinks a beer or wine once a year  . Drug Use: No  . Sexually Active: Yes     Comment: smokes 1 pack a day of cigarettes   Other Topics Concern  . Not on file   Social History Narrative   Regular exercise          Past Surgical History  Procedure Laterality Date   . Stents 2 times for vessel disease-kelly      4 vessels  . Appendectomy      at age 51    Family History  Problem Relation Age of Onset  . Alzheimer's disease Mother     No Known Allergies  Current Outpatient Prescriptions on File Prior to Visit  Medication Sig Dispense Refill  . aspirin 81 MG tablet Take 81 mg by mouth daily.        Marland Kitchen atenolol (TENORMIN) 50 MG tablet Take 75 mg by mouth daily.       . brimonidine (ALPHAGAN) 0.2 % ophthalmic solution 1 drop 2 (two) times daily.       . clopidogrel (PLAVIX) 75 MG tablet Take 75 mg by mouth daily.        . fexofenadine (ALLEGRA) 180 MG tablet Take 180 mg by mouth daily as needed. For allergy      . latanoprost (XALATAN) 0.005 % ophthalmic solution Place 1 drop into both eyes at bedtime.       Marland Kitchen lisinopril (PRINIVIL,ZESTRIL) 2.5 MG tablet Take 2.5 mg by mouth daily.        . methocarbamol (ROBAXIN) 500 MG tablet Take 1 tablet (500 mg total) by mouth 4 (four) times daily.  75 tablet  1  . Multiple Vitamin (MULTIVITAMIN) tablet Take 1 tablet by mouth daily.      Marland Kitchen  sitaGLIPtan-metformin (JANUMET) 50-500 MG per tablet Take 1 tablet by mouth daily.        Marland Kitchen albuterol (PROVENTIL HFA;VENTOLIN HFA) 108 (90 BASE) MCG/ACT inhaler Inhale 2 puffs into the lungs every 6 (six) hours as needed for wheezing (Cough).  1 Inhaler  0  . oxyCODONE-acetaminophen (ROXICET) 5-325 MG per tablet Take 1-2 tablets by mouth every 6 (six) hours as needed for pain.  75 tablet  0  . promethazine (PHENERGAN) 25 MG tablet Take 1 tablet (25 mg total) by mouth every 6 (six) hours as needed for nausea.  30 tablet  0   No current facility-administered medications on file prior to visit.    BP 150/90  Pulse 74  Temp(Src) 97.9 F (36.6 C) (Oral)  Wt 250 lb (113.399 kg)  BMI 36.9 kg/m2       Objective:   Physical Exam  Constitutional: He appears well-developed and well-nourished.  HENT:  Head: Normocephalic and atraumatic.  Eyes: Conjunctivae are normal.  Pupils are equal, round, and reactive to light.  Neck: Normal range of motion. Neck supple.  Cardiovascular: Normal rate and regular rhythm.   Pulmonary/Chest: Effort normal and breath sounds normal.  Abdominal: Soft. Bowel sounds are normal.          Assessment & Plan:  Blood glucoses 140- 150 post pradial Check A1c Lipid management Samples of janumet Samples of viagra

## 2013-04-08 ENCOUNTER — Other Ambulatory Visit: Payer: Self-pay | Admitting: Cardiovascular Disease

## 2013-04-11 NOTE — Telephone Encounter (Signed)
Rx was sent to pharmacy electronically. 

## 2013-04-13 ENCOUNTER — Encounter: Payer: Self-pay | Admitting: Pharmacist Clinician (PhC)/ Clinical Pharmacy Specialist

## 2013-04-14 ENCOUNTER — Other Ambulatory Visit: Payer: Self-pay | Admitting: *Deleted

## 2013-04-14 ENCOUNTER — Encounter: Payer: Self-pay | Admitting: Cardiovascular Disease

## 2013-04-14 ENCOUNTER — Ambulatory Visit (INDEPENDENT_AMBULATORY_CARE_PROVIDER_SITE_OTHER): Payer: Medicare Other | Admitting: Cardiovascular Disease

## 2013-04-14 VITALS — BP 100/60 | Ht 69.0 in | Wt 245.4 lb

## 2013-04-14 DIAGNOSIS — I119 Hypertensive heart disease without heart failure: Secondary | ICD-10-CM

## 2013-04-14 DIAGNOSIS — J40 Bronchitis, not specified as acute or chronic: Secondary | ICD-10-CM

## 2013-04-14 DIAGNOSIS — G4730 Sleep apnea, unspecified: Secondary | ICD-10-CM

## 2013-04-14 DIAGNOSIS — W57XXXA Bitten or stung by nonvenomous insect and other nonvenomous arthropods, initial encounter: Secondary | ICD-10-CM | POA: Insufficient documentation

## 2013-04-14 DIAGNOSIS — I2510 Atherosclerotic heart disease of native coronary artery without angina pectoris: Secondary | ICD-10-CM

## 2013-04-14 MED ORDER — DOXYCYCLINE HYCLATE 100 MG PO CAPS: 100.0000 mg | ORAL_CAPSULE | Freq: Two times a day (BID) | ORAL | Status: DC

## 2013-04-14 NOTE — Patient Instructions (Addendum)
Your physician has recommended you make the following change in your medication: start antibioitc for doxycline 100 mg twice daily for 2 weeks for tick bite. This Prescription  has already been sent to your pharmacy.  Your physician recommends that you schedule a follow-up appointment in: 6 MONTHS

## 2013-04-14 NOTE — Progress Notes (Signed)
Patient ID: [name], male   DOB: [birth year], 67 y.o.   MRN: [id number]     HPI: Evan Avery, is a 67 y.o. male who presents today for cardiology evaluation.  Evan Avery has known coronary artery disease and has a total of 4 stents placed in his right artery. His initial intervention was done with PTCA in 1992. In 1999 his fourth stent was placed in the mid RCA between the 2 previously placed stents the time this was a 3.5x28 mm non-DES Liberty stent his last nuclear perfusion study was in August 2012 which showed inferior thinning without significant ischemia traction fracture not calculated on that study did ectopy but previously had been 52%.  A cardiac standpoint, Evan Avery denies recent chest tightness. Evan Avery admits to a  recent cough but Evan Avery feels Evan Avery's had some bronchitis-type symptoms for the last several weeks. Note, several weeks ago Evan Avery believes that Evan Avery may have been bitten  by a tick. Evan Avery was uncertain if this truly was a tic or rather a mole in his left upper chest and the lateral pectoral region. Evan Avery denies any nuchal rigidity. Evan Avery is unaware of any skin rash. Evan Avery denies any awareness of petechiae or headaches. Evan Avery presents for evaluation.  Evan Avery does have sleep apnea and admits to using his CPAP compliantly.  Past Medical History  Diagnosis Date  . Hypertension   . Diabetes mellitus   . Hyperlipidemia   . CAD (coronary artery disease)     4 stents in right artery  . Myocardial infarction     1991  . Sleep apnea     uses CPAP regularly  . Complex tear of medial meniscus of left knee as current injury 07/26/2012    Past Surgical History  Procedure Laterality Date  . Stents 2 times for vessel disease-Tierney Behl      4 vessels  . Appendectomy      at age 41  . Doppler echocardiography  08/20/2008    EF >55%; LV systolic function normal; LV mildly dilated; doppler flow suggestive of impaired LV relaxation; RV mildly dilated, RV systolic function normal, RV systolic pressure  normal  . Cardiovascular stress test  06/16/2011    R/P MV - moderate perfusion defect in basal inferoseptal, basal inferior, mid inferseptal and mid inferior regions, consistent with infarct/scar and/or diaphragmatic attenuation; non-gated study secondary to ectopy; no CP or EKG changes for ischemia; abnormal study although no significant changes from previous study; in the absense of gated images cannot calculate EF or distinguish scar/artifact  . Cardiac catheterization  08/11/2008    angiographically patent LAD diagonal, circumflex and multiple circumflex obtuse marginal branches from the L coronary artery; stenting of mid R coronary artery with 3.5 x 28mm Liberte non DES postdilated w/ 4.0 DuraStent    Allergies  Allergen Reactions  . Niacin And Related     Current Outpatient Prescriptions  Medication Sig Dispense Refill  . aspirin 81 MG tablet Take 81 mg by mouth daily.        Marland Kitchen atenolol (TENORMIN) 50 MG tablet Take 75 mg by mouth daily.       Marland Kitchen atorvastatin (LIPITOR) 40 MG tablet Take 40 mg by mouth daily.      . brimonidine (ALPHAGAN) 0.2 % ophthalmic solution 1 drop 2 (two) times daily.       . clopidogrel (PLAVIX) 75 MG tablet TAKE 1 TABLET BY MOUTH EVERY DAY  90 tablet  1  . fexofenadine (ALLEGRA) 180 MG tablet Take  180 mg by mouth daily as needed. For allergy      . latanoprost (XALATAN) 0.005 % ophthalmic solution Place 1 drop into both eyes at bedtime.       Marland Kitchen lisinopril (PRINIVIL,ZESTRIL) 2.5 MG tablet Take 2.5 mg by mouth daily.        . Multiple Vitamin (MULTIVITAMIN) tablet Take 1 tablet by mouth daily.      . sitaGLIPtan-metformin (JANUMET) 50-500 MG per tablet Take 1 tablet by mouth daily.        Marland Kitchen albuterol (PROVENTIL HFA;VENTOLIN HFA) 108 (90 BASE) MCG/ACT inhaler Inhale 2 puffs into the lungs every 6 (six) hours as needed for wheezing (Cough).  1 Inhaler  0  . doxycycline (VIBRAMYCIN) 100 MG capsule Take 1 capsule (100 mg total) by mouth 2 (two) times daily.  28 capsule   0   No current facility-administered medications for this visit.    Socially Evan Avery is married has 2 children 2 grandchildren. Evan Avery does walk several times a week. Evan Avery does have a tobacco history. There is no alcohol use.  ROS negative for fever chills night sweats. Evan Avery is unaware of any rash. Evan Avery does need to increase cough and chest congestion. Denies bleeding. Evan Avery was concerned about the possibility of a tic versus a mole in his upper chest on the left. Evan Avery has been using CPAP. Evan Avery denies breakthrough snoring. Evan Avery denies significant wheezing. Evan Avery denies significant edema. Does have long-standing history of obesity and denies significant weight loss. Evan Avery denies paresthesias. Other system review is negative for  PE BP 100/60  Ht 5\' 9"  (1.753 m)  Wt 245 lb 6.4 oz (111.313 kg)  BMI 36.22 kg/m2  General: Alert, oriented, no distress.  Skin: normal turgor, no rashes HEENT: Normocephalic, atraumatic. Pupils round and reactive; sclera anicteric;no lid lag,  Nose without nasal septal hypertrophy Mouth/Parynx benign; Mallinpatti scale 3/4 Neck: The neck; No JVD, no carotid briuts Lungs: clear to ausculatation and percussion; no wheezing or rales Heart: RRR, s1 s2 normal 6 systolic murmur  Chest was notable for an engorged tick thatwas sticking out and the upper lateral left chest wall region. The patient was uncertain if this was a mole therefore had not pulled it out but there were a portion of the  legs which were exposed. Abdomen: soft, nontender; no hepatosplenomehaly, BS+; abdominal aorta nontender and not dilated by palpation. Pulses 2+ Extremities: no clubbing cyanosis or edema, Homan's sign negative. Specifically no petechial rashes around the ankles or wrists. I did not see any annulare type rashes or areas of erythema  Neurologic: grossly nonfocal  ECG: Sinus rhythm at 52 beats per minute. No ectopy. Normal intervals; nonspecific T changes in lead 3.  LABS:  BMET    Component Value Date/Time     NA 136 01/06/2013 1405   K 4.4 01/06/2013 1405   CL 101 01/06/2013 1405   CO2 26 01/06/2013 1405   GLUCOSE 186* 01/06/2013 1405   BUN 14 01/06/2013 1405   CREATININE 1.0 01/06/2013 1405   CALCIUM 9.6 01/06/2013 1405   GFRNONAA 87* 07/25/2012 1400   GFRAA >90 07/25/2012 1400     Hepatic Function Panel     Component Value Date/Time   PROT 6.9 10/18/2011 1144   ALBUMIN 3.8 10/18/2011 1144   AST 22 10/18/2011 1144   ALT 31 10/18/2011 1144   ALKPHOS 70 10/18/2011 1144   BILITOT 0.3 10/18/2011 1144   BILIDIR 0.1 02/28/2011 1208     CBC    Component Value  Date/Time   WBC 7.1 10/18/2011 1144   RBC 3.89* 10/18/2011 1144   HGB 14.4 07/26/2012 0715   HCT 36.9* 10/18/2011 1144   PLT 160.0 10/18/2011 1144   MCV 94.9 10/18/2011 1144   MCH 31.6 10/13/2011 0700   MCHC 34.2 10/18/2011 1144   RDW 12.9 10/18/2011 1144   LYMPHSABS 1.7 10/18/2011 1144   MONOABS 0.7 10/18/2011 1144   EOSABS 0.1 10/18/2011 1144   BASOSABS 0.1 10/18/2011 1144     BNP No results found for this basename: probnp    Lipid Panel     Component Value Date/Time   CHOL 143 01/06/2013 1405   TRIG 281.0* 01/06/2013 1405   HDL 32.30* 01/06/2013 1405   CHOLHDL 4 01/06/2013 1405   VLDL 56.2* 01/06/2013 1405   LDLCALC 54 06/24/2010 0907     RADIOLOGY: No results found.    ASSESSMENT AND PLAN: Cardiac standpoint, Evan Avery seems to be doing fairly well. Evan Avery is now 22 years status post initial PTCA to his right coronary artery. His last catheterization was in October 2009 when his fourth stent was placed in his RCA. Presently Evan Avery is without anginal symptoms or significant shortness of breath. Evan Avery does have some bronchitic symptomatology. Evan Avery also has had a tick in place for approximately at least 2 weeks. This was engorged significantly. I elected to empirically prescribe doxycycline 100 mg twice a day potentially safeguard against [facility] spotted fever or Lyme disease. His ECG is unchanged. Evan Avery is using CPAP 100% of  time. Evan Avery saw Dr. Lovell Sheehan for his diabetes mellitus which 3 months ago was still suboptimal with a hemoglobin A1c of 7.4. I'll see him in 6 months or cardiology reassessment.     Lennette Bihari, MD, [facility]  04/14/2013 11:37 AM

## 2013-05-07 ENCOUNTER — Other Ambulatory Visit: Payer: Self-pay

## 2013-05-09 ENCOUNTER — Ambulatory Visit (INDEPENDENT_AMBULATORY_CARE_PROVIDER_SITE_OTHER): Payer: Medicare Other | Admitting: Internal Medicine

## 2013-05-09 ENCOUNTER — Encounter: Payer: Self-pay | Admitting: Internal Medicine

## 2013-05-09 VITALS — BP 124/80 | HR 68 | Temp 98.2°F | Resp 16 | Ht 69.0 in | Wt 244.0 lb

## 2013-05-09 DIAGNOSIS — E785 Hyperlipidemia, unspecified: Secondary | ICD-10-CM

## 2013-05-09 DIAGNOSIS — J209 Acute bronchitis, unspecified: Secondary | ICD-10-CM

## 2013-05-09 DIAGNOSIS — E119 Type 2 diabetes mellitus without complications: Secondary | ICD-10-CM

## 2013-05-09 LAB — BASIC METABOLIC PANEL
BUN: 17 mg/dL (ref 6–23)
GFR: 85.94 mL/min (ref >60.00)
Glucose, Bld: 154 mg/dL — ABNORMAL HIGH (ref 70–99)
Potassium: 4.4 mEq/L (ref 3.5–5.1)

## 2013-05-09 LAB — CBC WITH DIFFERENTIAL/PLATELET
Basophils Relative: 0.6 % (ref 0.0–3.0)
Eosinophils Absolute: 0.2 10*3/uL (ref 0.0–0.7)
Hemoglobin: 14.2 g/dL (ref 13.0–17.0)
Lymphs Abs: 1.5 10*3/uL (ref 0.7–4.0)
MCHC: 34.2 g/dL (ref 30.0–36.0)
MCV: 94.8 fl (ref 78.0–100.0)
Monocytes Absolute: 0.7 10*3/uL (ref 0.1–1.0)
Neutro Abs: 5.2 10*3/uL (ref 1.4–7.7)
Neutrophils Relative %: 67.9 % (ref 43.0–77.0)
RBC: 4.38 Mil/uL (ref 4.22–5.81)

## 2013-05-09 LAB — LIPID PANEL
Cholesterol: 135 mg/dL (ref 0–200)
VLDL: 36.6 mg/dL (ref 0.0–40.0)

## 2013-05-09 LAB — HEPATIC FUNCTION PANEL
AST: 19 U/L (ref 0–37)
Albumin: 4.2 g/dL (ref 3.5–5.2)

## 2013-05-09 MED ORDER — CLOPIDOGREL BISULFATE 75 MG PO TABS: 75.0000 mg | ORAL_TABLET | Freq: Every day | ORAL | Status: DC

## 2013-05-09 MED ORDER — LISINOPRIL 2.5 MG PO TABS: 2.5000 mg | ORAL_TABLET | Freq: Every day | ORAL | Status: DC

## 2013-05-09 MED ORDER — ATENOLOL 50 MG PO TABS: 75.0000 mg | ORAL_TABLET | Freq: Every day | ORAL | Status: DC

## 2013-05-09 MED ORDER — ATORVASTATIN CALCIUM 40 MG PO TABS: 40.0000 mg | ORAL_TABLET | Freq: Every day | ORAL | Status: DC

## 2013-05-09 NOTE — Telephone Encounter (Signed)
Rx was sent to pharmacy electronically. 

## 2013-05-09 NOTE — Progress Notes (Signed)
Subjective:    Patient ID: Evan Avery, male    DOB: [birth year], 67 y.o.   MRN: [id number]  HPI  Patient is a 67 year old male followed for diabetes hypertension and hyperlipidemia also shared care with Dr. Nicholaus Bloom for cardiology and recent cardiology evaluation including an EKG.  During that office visit a tick bite was noticed and since he had no knowledge of tick bite no how long the tick had been in place he was given prophylactic treatment with doxycycline  Wart on scalp  Review of Systems  Constitutional: Negative for fever and fatigue.  HENT: Negative for hearing loss, congestion, neck pain and postnasal drip.   Eyes: Negative for discharge, redness and visual disturbance.  Respiratory: Negative for cough, shortness of breath and wheezing.   Cardiovascular: Negative for leg swelling.  Gastrointestinal: Negative for abdominal pain, constipation and abdominal distention.  Genitourinary: Negative for urgency and frequency.  Musculoskeletal: Negative for joint swelling and arthralgias.  Skin: Negative for color change and rash.       On top of scalp  wart  Neurological: Negative for weakness and light-headedness.  Hematological: Negative for adenopathy.  Psychiatric/Behavioral: Negative for behavioral problems.   Past Medical History  Diagnosis Date  . Hypertension   . Diabetes mellitus   . Hyperlipidemia   . CAD (coronary artery disease)     4 stents in right artery  . Myocardial infarction     1991  . Sleep apnea     uses CPAP regularly  . Complex tear of medial meniscus of left knee as current injury 07/26/2012    History   Social History  . Marital Status: Married    Spouse Name: N/A    Number of Children: N/A  . Years of Education: N/A   Occupational History  . Not on file.   Social History Main Topics  . Smoking status: Current Every Day Smoker -- 1.00 packs/day    Types: Cigarettes    Last Attempt to Quit: 10/11/2011  . Smokeless tobacco: Not on  file  . Alcohol Use: No     Comment: drinks a beer or wine once a year  . Drug Use: No  . Sexually Active: Yes     Comment: smokes 1 pack a day of cigarettes   Other Topics Concern  . Not on file   Social History Narrative   Regular exercise          Past Surgical History  Procedure Laterality Date  . Stents 2 times for vessel disease-kelly      4 vessels  . Appendectomy      at age 34  . Doppler echocardiography  08/20/2008    EF >55%; LV systolic function normal; LV mildly dilated; doppler flow suggestive of impaired LV relaxation; RV mildly dilated, RV systolic function normal, RV systolic pressure normal  . Cardiovascular stress test  06/16/2011    R/P MV - moderate perfusion defect in basal inferoseptal, basal inferior, mid inferseptal and mid inferior regions, consistent with infarct/scar and/or diaphragmatic attenuation; non-gated study secondary to ectopy; no CP or EKG changes for ischemia; abnormal study although no significant changes from previous study; in the absense of gated images cannot calculate EF or distinguish scar/artifact  . Cardiac catheterization  08/11/2008    angiographically patent LAD diagonal, circumflex and multiple circumflex obtuse marginal branches from the L coronary artery; stenting of mid R coronary artery with 3.5 x 28mm Liberte non DES postdilated w/ 4.0 DuraStent    Family  History  Problem Relation Age of Onset  . Alzheimer's disease Mother   . Heart disease Father   . Heart attack Brother   . Diabetes Brother   . Heart disease Maternal Grandmother   . Cancer Maternal Grandfather   . Diabetes Paternal Grandfather     Allergies  Allergen Reactions  . Niacin And Related     Current Outpatient Prescriptions on File Prior to Visit  Medication Sig Dispense Refill  . aspirin 81 MG tablet Take 81 mg by mouth daily.        Marland Kitchen atenolol (TENORMIN) 50 MG tablet Take 1.5 tablets (75 mg total) by mouth daily.  90 tablet  3  . atorvastatin  (LIPITOR) 40 MG tablet Take 1 tablet (40 mg total) by mouth daily.  90 tablet  1  . brimonidine (ALPHAGAN) 0.2 % ophthalmic solution 1 drop 2 (two) times daily.       . clopidogrel (PLAVIX) 75 MG tablet Take 1 tablet (75 mg total) by mouth daily.  90 tablet  1  . fexofenadine (ALLEGRA) 180 MG tablet Take 180 mg by mouth daily as needed. For allergy      . latanoprost (XALATAN) 0.005 % ophthalmic solution Place 1 drop into both eyes at bedtime.       Marland Kitchen lisinopril (PRINIVIL,ZESTRIL) 2.5 MG tablet Take 1 tablet (2.5 mg total) by mouth daily.  90 tablet  3  . Multiple Vitamin (MULTIVITAMIN) tablet Take 1 tablet by mouth daily.      . sitaGLIPtan-metformin (JANUMET) 50-500 MG per tablet Take 1 tablet by mouth daily.        Marland Kitchen albuterol (PROVENTIL HFA;VENTOLIN HFA) 108 (90 BASE) MCG/ACT inhaler Inhale 2 puffs into the lungs every 6 (six) hours as needed for wheezing (Cough).  1 Inhaler  0   No current facility-administered medications on file prior to visit.    BP 124/80  Pulse 68  Temp(Src) 98.2 F (36.8 C)  Resp 16  Ht 5\' 9"  (1.753 m)  Wt 244 lb (110.678 kg)  BMI 36.02 kg/m2       Objective:   Physical Exam  Nursing note and vitals reviewed. Constitutional: He appears well-developed and well-nourished.  HENT:  Head: Normocephalic and atraumatic.  Eyes: Conjunctivae are normal. Pupils are equal, round, and reactive to light.          Assessment & Plan:  Stable HTN Monitoring for DM Stable lipids Labs work and refills

## 2013-05-19 NOTE — Progress Notes (Signed)
done

## 2013-05-29 ENCOUNTER — Telehealth: Payer: Self-pay | Admitting: Internal Medicine

## 2013-05-29 ENCOUNTER — Ambulatory Visit (INDEPENDENT_AMBULATORY_CARE_PROVIDER_SITE_OTHER): Payer: Medicare Other | Admitting: Internal Medicine

## 2013-05-29 ENCOUNTER — Encounter: Payer: Self-pay | Admitting: Internal Medicine

## 2013-05-29 VITALS — BP 120/70 | HR 66 | Temp 98.3°F | Resp 20 | Wt 253.0 lb

## 2013-05-29 DIAGNOSIS — R509 Fever, unspecified: Secondary | ICD-10-CM

## 2013-05-29 DIAGNOSIS — E119 Type 2 diabetes mellitus without complications: Secondary | ICD-10-CM

## 2013-05-29 DIAGNOSIS — T148 Other injury of unspecified body region: Secondary | ICD-10-CM

## 2013-05-29 DIAGNOSIS — W57XXXA Bitten or stung by nonvenomous insect and other nonvenomous arthropods, initial encounter: Secondary | ICD-10-CM

## 2013-05-29 NOTE — Patient Instructions (Addendum)
Drink as much fluid as you  can tolerate over the next few days and advance diet  as tolerated  Call or return to clinic prn if these symptoms worsen or fail to improve as anticipated.

## 2013-05-29 NOTE — Telephone Encounter (Signed)
Requesting a call back regarding his health. I see no DPR. Doesn't sound like she wants info, she wants to give you info. Thanks.

## 2013-05-29 NOTE — Telephone Encounter (Signed)
Ov with dr Kirtland Bouchard today

## 2013-05-29 NOTE — Progress Notes (Signed)
Subjective:    Patient ID: Evan Avery, male    DOB: 04-04-1946, 67 y.o.   MRN: [id number]  HPI   67 year old patient who is seen today with a chief complaint of fever. He was hospitalized in December of 2012 for a severe SIRS which required a brief ICU admission. No definitive pathogen was identified and it was felt to be viral. The patient awoke at approximately 5:30 this morning with the fever and chills. Temperature at the 9:30 was 101 but has improved throughout the day.  At the present time he feels a bit achy with poor appetite but generally well.  He has a mild chronic cough with postnasal drip that has been unchanged. He was treated by cardiology in June with doxycycline after a tick exposure but there is been no recent rash or tick exposure.  He describes some mild burning of the eyes but no real headache. Denies any stiff neck. No GI symptoms  Past Medical History  Diagnosis Date  . Hypertension   . Diabetes mellitus   . Hyperlipidemia   . CAD (coronary artery disease)     4 stents in right artery  . Myocardial infarction     1991  . Sleep apnea     uses CPAP regularly  . Complex tear of medial meniscus of left knee as current injury 07/26/2012    History   Social History  . Marital Status: Married    Spouse Name: N/A    Number of Children: N/A  . Years of Education: N/A   Occupational History  . Not on file.   Social History Main Topics  . Smoking status: Current Every Day Smoker -- 1.00 packs/day    Types: Cigarettes    Last Attempt to Quit: 10/11/2011  . Smokeless tobacco: Not on file  . Alcohol Use: No     Comment: drinks a beer or wine once a year  . Drug Use: No  . Sexually Active: Yes     Comment: smokes 1 pack a day of cigarettes   Other Topics Concern  . Not on file   Social History Narrative   Regular exercise          Past Surgical History  Procedure Laterality Date  . Stents 2 times for vessel disease-kelly      4 vessels  .  Appendectomy      at age 47  . Doppler echocardiography  08/20/2008    EF >55%; LV systolic function normal; LV mildly dilated; doppler flow suggestive of impaired LV relaxation; RV mildly dilated, RV systolic function normal, RV systolic pressure normal  . Cardiovascular stress test  06/16/2011    R/P MV - moderate perfusion defect in basal inferoseptal, basal inferior, mid inferseptal and mid inferior regions, consistent with infarct/scar and/or diaphragmatic attenuation; non-gated study secondary to ectopy; no CP or EKG changes for ischemia; abnormal study although no significant changes from previous study; in the absense of gated images cannot calculate EF or distinguish scar/artifact  . Cardiac catheterization  08/11/2008    angiographically patent LAD diagonal, circumflex and multiple circumflex obtuse marginal branches from the L coronary artery; stenting of mid R coronary artery with 3.5 x 28mm Liberte non DES postdilated w/ 4.0 DuraStent    Family History  Problem Relation Age of Onset  . Alzheimer's disease Mother   . Heart disease Father   . Heart attack Brother   . Diabetes Brother   . Heart disease Maternal Grandmother   .  Cancer Maternal Grandfather   . Diabetes Paternal Grandfather     Allergies  Allergen Reactions  . Niacin And Related     Current Outpatient Prescriptions on File Prior to Visit  Medication Sig Dispense Refill  . aspirin 81 MG tablet Take 81 mg by mouth daily.        Marland Kitchen atenolol (TENORMIN) 50 MG tablet Take 1.5 tablets (75 mg total) by mouth daily.  90 tablet  3  . atorvastatin (LIPITOR) 40 MG tablet Take 1 tablet (40 mg total) by mouth daily.  90 tablet  1  . brimonidine (ALPHAGAN) 0.2 % ophthalmic solution 1 drop 2 (two) times daily.       . clopidogrel (PLAVIX) 75 MG tablet Take 1 tablet (75 mg total) by mouth daily.  90 tablet  1  . fexofenadine (ALLEGRA) 180 MG tablet Take 180 mg by mouth daily as needed. For allergy      . latanoprost (XALATAN)  0.005 % ophthalmic solution Place 1 drop into both eyes at bedtime.       Marland Kitchen lisinopril (PRINIVIL,ZESTRIL) 2.5 MG tablet Take 1 tablet (2.5 mg total) by mouth daily.  90 tablet  3  . Multiple Vitamin (MULTIVITAMIN) tablet Take 1 tablet by mouth daily.      . sitaGLIPtan-metformin (JANUMET) 50-500 MG per tablet Take 1 tablet by mouth daily.        Marland Kitchen albuterol (PROVENTIL HFA;VENTOLIN HFA) 108 (90 BASE) MCG/ACT inhaler Inhale 2 puffs into the lungs every 6 (six) hours as needed for wheezing (Cough).  1 Inhaler  0   No current facility-administered medications on file prior to visit.    BP 120/70  Pulse 66  Temp(Src) 98.3 F (36.8 C) (Oral)  Resp 20  Wt 253 lb (114.76 kg)  BMI 37.34 kg/m2  SpO2 97%       Review of Systems  Constitutional: Positive for fever, chills, activity change, appetite change and fatigue.  HENT: Positive for postnasal drip. Negative for hearing loss, ear pain, congestion, sore throat, trouble swallowing, neck stiffness, dental problem, voice change and tinnitus.   Eyes: Negative for pain, discharge and visual disturbance.  Respiratory: Positive for cough. Negative for chest tightness, wheezing and stridor.   Cardiovascular: Negative for chest pain, palpitations and leg swelling.  Gastrointestinal: Negative for nausea, vomiting, abdominal pain, diarrhea, constipation, blood in stool and abdominal distention.  Genitourinary: Negative for urgency, hematuria, flank pain, discharge, difficulty urinating and genital sores.  Musculoskeletal: Negative for myalgias, back pain, joint swelling, arthralgias and gait problem.  Skin: Negative for rash.  Neurological: Negative for dizziness, syncope, speech difficulty, weakness, numbness and headaches.  Hematological: Negative for adenopathy. Does not bruise/bleed easily.  Psychiatric/Behavioral: Negative for behavioral problems and dysphoric mood. The patient is not nervous/anxious.        Objective:   Physical Exam   Constitutional: He is oriented to person, place, and time. He appears well-developed.  Blood pressure 110/70 Pulse rate 64  No distress  HENT:  Head: Normocephalic.  Right Ear: External ear normal.  Left Ear: External ear normal.  Status post uvulectomy  Eyes: Conjunctivae and EOM are normal.  Neck: Normal range of motion.  Cardiovascular: Normal rate and normal heart sounds.   Pulmonary/Chest: Breath sounds normal. No respiratory distress. He has no wheezes. He has no rales. He exhibits no tenderness.  Abdominal: Bowel sounds are normal. He exhibits no distension. There is no tenderness. There is no rebound and no guarding.  Musculoskeletal: Normal range of motion.  He exhibits no edema and no tenderness.  Neurological: He is alert and oriented to person, place, and time.  Skin: No rash noted.  Psychiatric: He has a normal mood and affect. His behavior is normal.          Assessment & Plan:   Acute febrile syndrome. Patient appears to be improving throughout the day and is now afebrile. Blood pressure vital signs are stable. We'll check a CBC. His illness in December of 2012 completed by thrombocytopenia. Hospital records reviewed  The patient will report any clinical deterioration  Coronary artery disease stable Diabetes mellitus type 2

## 2013-05-30 LAB — CBC WITH DIFFERENTIAL/PLATELET
Eosinophils Absolute: 0 10*3/uL (ref 0.0–0.7)
MCHC: 33.7 g/dL (ref 30.0–36.0)
MCV: 94.6 fl (ref 78.0–100.0)
Monocytes Absolute: 0.4 10*3/uL (ref 0.1–1.0)
Neutrophils Relative %: 88.6 % — ABNORMAL HIGH (ref 43.0–77.0)
Platelets: 116 10*3/uL — ABNORMAL LOW (ref 150.0–400.0)
RDW: 13.5 % (ref 11.5–14.6)

## 2013-09-17 ENCOUNTER — Ambulatory Visit (INDEPENDENT_AMBULATORY_CARE_PROVIDER_SITE_OTHER): Payer: Medicare Other | Admitting: Internal Medicine

## 2013-09-17 ENCOUNTER — Encounter: Payer: Self-pay | Admitting: Internal Medicine

## 2013-09-17 VITALS — BP 110/74 | HR 72 | Temp 98.2°F | Resp 16 | Ht 69.0 in | Wt 248.0 lb

## 2013-09-17 DIAGNOSIS — Z23 Encounter for immunization: Secondary | ICD-10-CM

## 2013-09-17 DIAGNOSIS — F4310 Post-traumatic stress disorder, unspecified: Secondary | ICD-10-CM

## 2013-09-17 DIAGNOSIS — F4312 Post-traumatic stress disorder, chronic: Secondary | ICD-10-CM

## 2013-09-17 DIAGNOSIS — E785 Hyperlipidemia, unspecified: Secondary | ICD-10-CM

## 2013-09-17 LAB — COMPREHENSIVE METABOLIC PANEL
AST: 20 U/L (ref 0–37)
BUN: 14 mg/dL (ref 6–23)
Calcium: 9.4 mg/dL (ref 8.4–10.5)
Chloride: 105 mEq/L (ref 96–112)
Creatinine, Ser: 1 mg/dL (ref 0.4–1.5)
GFR: 80.81 mL/min (ref >60.00)
Glucose, Bld: 186 mg/dL — ABNORMAL HIGH (ref 70–99)
Potassium: 3.9 mEq/L (ref 3.5–5.1)
Total Protein: 7.3 g/dL (ref 6.0–8.3)

## 2013-09-17 LAB — LDL CHOLESTEROL, DIRECT: Direct LDL: 63.1 mg/dL

## 2013-09-17 LAB — HEMOGLOBIN A1C: Hgb A1c MFr Bld: 7.7 % — ABNORMAL HIGH (ref 4.6–6.5)

## 2013-09-17 LAB — LIPID PANEL
Cholesterol: 139 mg/dL (ref 0–200)
VLDL: 60.2 mg/dL — ABNORMAL HIGH (ref 0.0–40.0)

## 2013-09-17 MED ORDER — ATORVASTATIN CALCIUM 40 MG PO TABS: 40.0000 mg | ORAL_TABLET | Freq: Every day | ORAL | Status: DC

## 2013-09-17 MED ORDER — BUPROPION HCL ER (XL) 150 MG PO TB24: 150.0000 mg | ORAL_TABLET | Freq: Every day | ORAL | Status: DC

## 2013-09-17 NOTE — Progress Notes (Signed)
Subjective:    Patient ID: FIN HUPP, male    DOB: 03-Aug-1946, 67 y.o.   MRN: [id number]  Diabetes Hypoglycemia symptoms include nervousness/anxiousness.  Coronary Artery Disease Risk factors include hyperlipidemia.  Hyperlipidemia    Wife thinks he is depressed   Review of Systems  Constitutional: Negative.   HENT: Negative.   Respiratory: Negative.   Cardiovascular: Negative.   Gastrointestinal: Negative.   Musculoskeletal: Negative.   Skin: Negative.   Psychiatric/Behavioral: Positive for sleep disturbance, dysphoric mood and decreased concentration. The patient is nervous/anxious.    Past Medical History  Diagnosis Date  . Hypertension   . Diabetes mellitus   . Hyperlipidemia   . CAD (coronary artery disease)     4 stents in right artery  . Myocardial infarction     1991  . Sleep apnea     uses CPAP regularly  . Complex tear of medial meniscus of left knee as current injury 07/26/2012    History   Social History  . Marital Status: Married    Spouse Name: N/A    Number of Children: N/A  . Years of Education: N/A   Occupational History  . Not on file.   Social History Main Topics  . Smoking status: Current Every Day Smoker -- 1.00 packs/day    Types: Cigarettes    Last Attempt to Quit: 10/11/2011  . Smokeless tobacco: Not on file  . Alcohol Use: No     Comment: drinks a beer or wine once a year  . Drug Use: No  . Sexual Activity: Yes     Comment: smokes 1 pack a day of cigarettes   Other Topics Concern  . Not on file   Social History Narrative   Regular exercise          Past Surgical History  Procedure Laterality Date  . Stents 2 times for vessel disease-kelly      4 vessels  . Appendectomy      at age 71  . Doppler echocardiography  08/20/2008    EF >55%; LV systolic function normal; LV mildly dilated; doppler flow suggestive of impaired LV relaxation; RV mildly dilated, RV systolic function normal, RV systolic pressure normal  .  Cardiovascular stress test  06/16/2011    R/P MV - moderate perfusion defect in basal inferoseptal, basal inferior, mid inferseptal and mid inferior regions, consistent with infarct/scar and/or diaphragmatic attenuation; non-gated study secondary to ectopy; no CP or EKG changes for ischemia; abnormal study although no significant changes from previous study; in the absense of gated images cannot calculate EF or distinguish scar/artifact  . Cardiac catheterization  08/11/2008    angiographically patent LAD diagonal, circumflex and multiple circumflex obtuse marginal branches from the L coronary artery; stenting of mid R coronary artery with 3.5 x 28mm Liberte non DES postdilated w/ 4.0 DuraStent    Family History  Problem Relation Age of Onset  . Alzheimer's disease Mother   . Heart disease Father   . Heart attack Brother   . Diabetes Brother   . Heart disease Maternal Grandmother   . Cancer Maternal Grandfather   . Diabetes Paternal Grandfather     Allergies  Allergen Reactions  . Niacin And Related     Current Outpatient Prescriptions on File Prior to Visit  Medication Sig Dispense Refill  . aspirin 81 MG tablet Take 81 mg by mouth daily.        Marland Kitchen atenolol (TENORMIN) 50 MG tablet Take 1.5 tablets (75 mg  total) by mouth daily.  90 tablet  3  . atorvastatin (LIPITOR) 40 MG tablet Take 1 tablet (40 mg total) by mouth daily.  90 tablet  1  . brimonidine (ALPHAGAN) 0.2 % ophthalmic solution 1 drop 2 (two) times daily.       . clopidogrel (PLAVIX) 75 MG tablet Take 1 tablet (75 mg total) by mouth daily.  90 tablet  1  . fexofenadine (ALLEGRA) 180 MG tablet Take 180 mg by mouth daily as needed. For allergy      . latanoprost (XALATAN) 0.005 % ophthalmic solution Place 1 drop into both eyes at bedtime.       Marland Kitchen lisinopril (PRINIVIL,ZESTRIL) 2.5 MG tablet Take 1 tablet (2.5 mg total) by mouth daily.  90 tablet  3  . Multiple Vitamin (MULTIVITAMIN) tablet Take 1 tablet by mouth daily.      .  sitaGLIPtan-metformin (JANUMET) 50-500 MG per tablet Take 1 tablet by mouth daily.        Marland Kitchen albuterol (PROVENTIL HFA;VENTOLIN HFA) 108 (90 BASE) MCG/ACT inhaler Inhale 2 puffs into the lungs every 6 (six) hours as needed for wheezing (Cough).  1 Inhaler  0   No current facility-administered medications on file prior to visit.    BP 110/74  Pulse 72  Temp(Src) 98.2 F (36.8 C)  Resp 16  Ht 5\' 9"  (1.753 m)  Wt 248 lb (112.492 kg)  BMI 36.61 kg/m2       Objective:   Physical Exam  Nursing note and vitals reviewed. Constitutional: He is oriented to person, place, and time. He appears well-developed and well-nourished.  HENT:  Head: Normocephalic and atraumatic.  Eyes: Conjunctivae are normal.  Neck: Normal range of motion. Neck supple.  Cardiovascular: Normal rate and regular rhythm.   Pulmonary/Chest: Effort normal.  Abdominal: Bowel sounds are normal.  Musculoskeletal: Normal range of motion.  Neurological: He is alert and oriented to person, place, and time.  Psychiatric: He has a normal mood and affect. His behavior is normal.          Assessment & Plan:  DM monitor AIC Stable  HTN Increased stress and  Trial of the Wellbutrin  I have spent more than 30 minutes examining this patient face-to-face of which over half was spent in counseling

## 2013-09-17 NOTE — Progress Notes (Signed)
Pre visit review using our clinic review tool, if applicable. No additional management support is needed unless otherwise documented below in the visit note. 

## 2013-10-07 ENCOUNTER — Ambulatory Visit (INDEPENDENT_AMBULATORY_CARE_PROVIDER_SITE_OTHER): Payer: Medicare Other | Admitting: Cardiovascular Disease

## 2013-10-07 ENCOUNTER — Encounter: Payer: Self-pay | Admitting: Cardiovascular Disease

## 2013-10-07 VITALS — BP 132/70 | HR 56 | Ht 69.0 in | Wt 247.6 lb

## 2013-10-07 DIAGNOSIS — E669 Obesity, unspecified: Secondary | ICD-10-CM

## 2013-10-07 DIAGNOSIS — E785 Hyperlipidemia, unspecified: Secondary | ICD-10-CM

## 2013-10-07 DIAGNOSIS — I10 Essential (primary) hypertension: Secondary | ICD-10-CM

## 2013-10-07 DIAGNOSIS — I2510 Atherosclerotic heart disease of native coronary artery without angina pectoris: Secondary | ICD-10-CM

## 2013-10-07 DIAGNOSIS — G4730 Sleep apnea, unspecified: Secondary | ICD-10-CM

## 2013-10-07 MED ORDER — OMEGA-3-ACID ETHYL ESTERS 1 G PO CAPS: 1.0000 g | ORAL_CAPSULE | Freq: Two times a day (BID) | ORAL | Status: DC

## 2013-10-07 NOTE — Patient Instructions (Signed)
Your physician has recommended you make the following change in your medication:start lovaza as directed this has already ben sent to the  Pharmacy.  Your physician recommends that you return for lab work in: 6 MONTHS.  Your physician has requested that you have a lexiscan myoview. For further information please visit [email or web address] Please follow instruction sheet, as given. This will be done in 6 months.

## 2013-10-08 ENCOUNTER — Encounter: Payer: Self-pay | Admitting: Cardiovascular Disease

## 2013-10-08 DIAGNOSIS — E669 Obesity, unspecified: Secondary | ICD-10-CM | POA: Insufficient documentation

## 2013-10-08 DIAGNOSIS — I10 Essential (primary) hypertension: Secondary | ICD-10-CM | POA: Insufficient documentation

## 2013-10-08 DIAGNOSIS — E785 Hyperlipidemia, unspecified: Secondary | ICD-10-CM | POA: Insufficient documentation

## 2013-10-08 DIAGNOSIS — E1169 Type 2 diabetes mellitus with other specified complication: Secondary | ICD-10-CM | POA: Insufficient documentation

## 2013-10-08 NOTE — Progress Notes (Signed)
Patient ID: Evan Avery, male   DOB: 1946-08-02, 67 y.o.   MRN: [id number]      HPI: Evan Avery, is a 67 y.o. male who presents today for cardiology evaluation.  Evan Avery has known coronary artery disease.  He has a total of 4 stents  in his right artery. In 1992, he underwent initial PTCA of his right coronary artery. Subsequently, he underwent stenting of the proximal and mid RCA.  In 1999 his fourth stent was placed in the mid RCA between the 2 previously placed stents and was a 3.5x28 mm non-DES Liberty stent. His last nuclear perfusion study done in August 2012 showed inferior thinning without significant ischemia.  EF was not calculated secondary to  ectopy but previously had been 52%.  From a cardiac standpoint, Evan Avery denies recent chest tightness.   When I had last seen him, he had been bitten by ticks several weeks before that office visit  and I did prescribe doxycycline.  Additional problems include hypertension, hyperlipidemia, obesity, and obstructive sleep apnea for which he is he uses CPAP 100% of the time.  Presently, he denies recurrent chest pain symptoms. He does have some mild lightheadedness if he bends over a properly. He did undergo left knee surgery last October.  Past Medical History  Diagnosis Date  . Hypertension   . Diabetes mellitus   . Hyperlipidemia   . CAD (coronary artery disease)     4 stents in right artery  . Myocardial infarction     1991  . Sleep apnea     uses CPAP regularly  . Complex tear of medial meniscus of left knee as current injury 07/26/2012    Past Surgical History  Procedure Laterality Date  . Stents 2 times for vessel disease-kelly      4 vessels  . Appendectomy      at age 47  . Doppler echocardiography  08/20/2008    EF >55%; LV systolic function normal; LV mildly dilated; doppler flow suggestive of impaired LV relaxation; RV mildly dilated, RV systolic function normal, RV systolic pressure normal  .  Cardiovascular stress test  06/16/2011    R/P MV - moderate perfusion defect in basal inferoseptal, basal inferior, mid inferseptal and mid inferior regions, consistent with infarct/scar and/or diaphragmatic attenuation; non-gated study secondary to ectopy; no CP or EKG changes for ischemia; abnormal study although no significant changes from previous study; in the absense of gated images cannot calculate EF or distinguish scar/artifact  . Cardiac catheterization  08/11/2008    angiographically patent LAD diagonal, circumflex and multiple circumflex obtuse marginal branches from the L coronary artery; stenting of mid R coronary artery with 3.5 x 28mm Liberte non DES postdilated w/ 4.0 DuraStent    Allergies  Allergen Reactions  . Niacin And Related     Current Outpatient Prescriptions  Medication Sig Dispense Refill  . aspirin 81 MG tablet Take 81 mg by mouth daily.        Marland Kitchen atenolol (TENORMIN) 50 MG tablet Take 1.5 tablets (75 mg total) by mouth daily.  90 tablet  3  . atorvastatin (LIPITOR) 40 MG tablet Take 1 tablet (40 mg total) by mouth daily.  90 tablet  1  . brimonidine (ALPHAGAN) 0.2 % ophthalmic solution 1 drop 2 (two) times daily.       Marland Kitchen buPROPion (WELLBUTRIN XL) 150 MG 24 hr tablet Take 1 tablet (150 mg total) by mouth daily.  30 tablet  11  . clopidogrel (  PLAVIX) 75 MG tablet Take 1 tablet (75 mg total) by mouth daily.  90 tablet  1  . fexofenadine (ALLEGRA) 180 MG tablet Take 180 mg by mouth daily as needed. For allergy      . latanoprost (XALATAN) 0.005 % ophthalmic solution Place 1 drop into both eyes at bedtime.       Marland Kitchen lisinopril (PRINIVIL,ZESTRIL) 2.5 MG tablet Take 1 tablet (2.5 mg total) by mouth daily.  90 tablet  3  . Multiple Vitamin (MULTIVITAMIN) tablet Take 1 tablet by mouth daily.      . sitaGLIPtan-metformin (JANUMET) 50-500 MG per tablet Take 1 tablet by mouth daily.        Marland Kitchen albuterol (PROVENTIL HFA;VENTOLIN HFA) 108 (90 BASE) MCG/ACT inhaler Inhale 2 puffs into  the lungs every 6 (six) hours as needed for wheezing (Cough).  1 Inhaler  0  . omega-3 acid ethyl esters (LOVAZA) 1 G capsule Take 1 capsule (1 g total) by mouth 2 (two) times daily.  180 capsule  3   No current facility-administered medications for this visit.    Socially he is married has 2 children 2 grandchildren. He does walk several times a week. He does have a tobacco history. There is no alcohol use.  ROS negative for fever chills night sweats. He is unaware of any rash.  At times he does admit to cough and chest congestion. He denies recent wheezing. Is unaware of lymphadenopathy. And weight change. There is no chest pressure. He denies syncope or palpitations. He does note some mild lightheadedness if he bends over. He denies nausea vomiting or diarrhea. He denies blood in stool or urine. He denies myalgias. Status post left knee surgery. He denies tremors. He denies neurologic symptoms. His sleep is improved with CPAP therapy. He is unaware of breakthrough snoring. Other comprehensive 14 point system review is negative.  PE BP 132/70  Pulse 56  Ht 5\' 9"  (1.753 m)  Wt 247 lb 9.6 oz (112.311 kg)  BMI 36.55 kg/m2  General: Alert, oriented, no distress.  Skin: normal turgor, no rashes HEENT: Normocephalic, atraumatic. Pupils round and reactive; sclera anicteric;no lid lag,  Nose without nasal septal hypertrophy Mouth/Parynx benign; Mallinpatti scale 3/4 Neck: The neck; No JVD, no carotid briuts Chest: No tenderness to palpation  Lungs: clear to ausculatation and percussion; no wheezing or rales Heart: RRR, s1 s2 normal 6 systolic murmur  Abdomen: soft, nontender; no hepatosplenomehaly, BS+; abdominal aorta nontender and not dilated by palpation. Back: No CVA tenderness  Pulses 2+ Extremities: no clubbing cyanosis or edema, Homan's sign negative.  Neurologic: grossly nonfocal; cranial nerves intact  Psychologic: Normal affect and mood  ECG: Sinus rhythm at 56 beats per minute.  No ectopy. Normal intervals; nonspecific T changes in lead 3.  LABS:  BMET    Component Value Date/Time   NA 137 09/17/2013 0949   K 3.9 09/17/2013 0949   CL 105 09/17/2013 0949   CO2 26 09/17/2013 0949   GLUCOSE 186* 09/17/2013 0949   BUN 14 09/17/2013 0949   CREATININE 1.0 09/17/2013 0949   CALCIUM 9.4 09/17/2013 0949   GFRNONAA 87* 07/25/2012 1400   GFRAA >90 07/25/2012 1400     Hepatic Function Panel     Component Value Date/Time   PROT 7.3 09/17/2013 0949   ALBUMIN 4.2 09/17/2013 0949   AST 20 09/17/2013 0949   ALT 28 09/17/2013 0949   ALKPHOS 58 09/17/2013 0949   BILITOT 0.7 09/17/2013 0949   BILIDIR 0.0 05/09/2013  1105     CBC    Component Value Date/Time   WBC 11.3* 05/29/2013 1724   RBC 4.17* 05/29/2013 1724   HGB 13.3 05/29/2013 1724   HCT 39.4 05/29/2013 1724   PLT 116.0* 05/29/2013 1724   MCV 94.6 05/29/2013 1724   MCH 31.6 10/13/2011 0700   MCHC 33.7 05/29/2013 1724   RDW 13.5 05/29/2013 1724   LYMPHSABS 0.8 05/29/2013 1724   MONOABS 0.4 05/29/2013 1724   EOSABS 0.0 05/29/2013 1724   BASOSABS 0.1 05/29/2013 1724     BNP No results found for this basename: probnp    Lipid Panel     Component Value Date/Time   CHOL 139 09/17/2013 0949   TRIG 301.0* 09/17/2013 0949   HDL 33.20* 09/17/2013 0949   CHOLHDL 4 09/17/2013 0949   VLDL 60.2* 09/17/2013 0949   LDLCALC 61 05/09/2013 1105     RADIOLOGY: No results found.    ASSESSMENT AND PLAN:  Mr. Grobe  is now 21 years status post initial PTCA to his right coronary artery. His last catheterization was in October 2009 when his fourth stent was placed in his RCA. Presently he is without anginal symptoms or significant shortness of breath. His blood pressure is well-controlled today on lisinopril 2.5 mg a day atenolol 75 mg. He is on Lipitor 40 mg for hyperlipidemia. Continues to be on dual antiplatelet therapy with aspirin 81 mg and Plavix 75 mg with his multiple stents. Clinically he is without chest pain or anginal  symptoms. I did review his recent laboratory. He does have hyperlipidemia with increased triglyceride levels. I'm recommending the addition of Lovaza start  2 capsules initially daily with potential need to increase to 2 twice a day. His last nuclear perfusion study was in 2012. I will see him in 6 months for followup evaluation and prior to that office visit he will undergo appropriate followup exercise Myoview study to assess the scar/ischemia.    Lennette Bihari, MD, [facility]  10/08/2013 8:43 AM

## 2013-10-09 ENCOUNTER — Encounter: Payer: Self-pay | Admitting: Cardiovascular Disease

## 2013-10-23 HISTORY — PX: KNEE SURGERY: SHX244

## 2013-12-29 ENCOUNTER — Other Ambulatory Visit: Payer: Self-pay | Admitting: *Deleted

## 2013-12-29 MED ORDER — ATENOLOL 50 MG PO TABS: 75.0000 mg | ORAL_TABLET | Freq: Every day | ORAL | Status: DC

## 2013-12-29 NOTE — Telephone Encounter (Signed)
Rx was sent to pharmacy electronically. 

## 2014-01-21 ENCOUNTER — Encounter: Payer: Self-pay | Admitting: Internal Medicine

## 2014-01-21 ENCOUNTER — Telehealth: Payer: Self-pay | Admitting: Internal Medicine

## 2014-01-21 ENCOUNTER — Ambulatory Visit (INDEPENDENT_AMBULATORY_CARE_PROVIDER_SITE_OTHER): Payer: Medicare Other | Admitting: Internal Medicine

## 2014-01-21 VITALS — BP 120/66 | HR 56 | Temp 98.2°F | Ht 69.0 in | Wt 248.0 lb

## 2014-01-21 DIAGNOSIS — E669 Obesity, unspecified: Secondary | ICD-10-CM

## 2014-01-21 DIAGNOSIS — E1165 Type 2 diabetes mellitus with hyperglycemia: Principal | ICD-10-CM

## 2014-01-21 DIAGNOSIS — IMO0001 Reserved for inherently not codable concepts without codable children: Secondary | ICD-10-CM

## 2014-01-21 MED ORDER — AZITHROMYCIN 250 MG PO TABS: ORAL_TABLET | ORAL | Status: DC

## 2014-01-21 NOTE — Progress Notes (Signed)
Subjective:    Patient ID: Evan Avery, male    DOB: 31-Jul-1946, 68 y.o.   MRN: [id number]  HPI COPD   Stable on proventil but has not used it recently [name] added omega 3 to lower TG still onm the statisns On plavix for CAD Stable prinavil Stress test in June Good CBG's but does not check them regularly Stable in the mid 7 range   Review of Systems  Constitutional: Negative for fever and fatigue.  HENT: Negative for congestion, hearing loss and postnasal drip.   Eyes: Negative for discharge, redness and visual disturbance.  Respiratory: Negative for cough, shortness of breath and wheezing.   Cardiovascular: Negative for leg swelling.  Gastrointestinal: Negative for abdominal pain, constipation and abdominal distention.  Genitourinary: Negative for urgency and frequency.  Musculoskeletal: Negative for arthralgias, joint swelling and neck pain.  Skin: Negative for color change and rash.  Neurological: Negative for weakness and light-headedness.  Hematological: Negative for adenopathy.  Psychiatric/Behavioral: Negative for behavioral problems.   Past Medical History  Diagnosis Date  . Hypertension   . Diabetes mellitus   . Hyperlipidemia   . CAD (coronary artery disease)     4 stents in right artery  . Myocardial infarction     1991  . Sleep apnea     uses CPAP regularly  . Complex tear of medial meniscus of left knee as current injury 07/26/2012    History   Social History  . Marital Status: Married    Spouse Name: N/A    Number of Children: N/A  . Years of Education: N/A   Occupational History  . Not on file.   Social History Main Topics  . Smoking status: Current Every Day Smoker -- 1.00 packs/day    Types: Cigarettes    Last Attempt to Quit: 10/11/2011  . Smokeless tobacco: Not on file  . Alcohol Use: No     Comment: drinks a beer or wine once a year  . Drug Use: No  . Sexual Activity: Yes     Comment: smokes 1 pack a day of cigarettes   Other  Topics Concern  . Not on file   Social History Narrative   Regular exercise          Past Surgical History  Procedure Laterality Date  . Stents 2 times for vessel disease-kelly      4 vessels  . Appendectomy      at age 70  . Doppler echocardiography  08/20/2008    EF >38%; LV systolic function normal; LV mildly dilated; doppler flow suggestive of impaired LV relaxation; RV mildly dilated, RV systolic function normal, RV systolic pressure normal  . Cardiovascular stress test  06/16/2011    R/P MV - moderate perfusion defect in basal inferoseptal, basal inferior, mid inferseptal and mid inferior regions, consistent with infarct/scar and/or diaphragmatic attenuation; non-gated study secondary to ectopy; no CP or EKG changes for ischemia; abnormal study although no significant changes from previous study; in the absense of gated images cannot calculate EF or distinguish scar/artifact  . Cardiac catheterization  08/11/2008    angiographically patent LAD diagonal, circumflex and multiple circumflex obtuse marginal branches from the L coronary artery; stenting of mid R coronary artery with 3.5 x 50mm Liberte non DES postdilated w/ 4.0 DuraStent    Family History  Problem Relation Age of Onset  . Alzheimer's disease Mother   . Heart disease Father   . Heart attack Brother   . Diabetes Brother   .  Heart disease Maternal Grandmother   . Cancer Maternal Grandfather   . Diabetes Paternal Grandfather     Allergies  Allergen Reactions  . Niacin And Related     Current Outpatient Prescriptions on File Prior to Visit  Medication Sig Dispense Refill  . albuterol (PROVENTIL HFA;VENTOLIN HFA) 108 (90 BASE) MCG/ACT inhaler Inhale 2 puffs into the lungs every 6 (six) hours as needed for wheezing (Cough).  1 Inhaler  0  . aspirin 81 MG tablet Take 81 mg by mouth daily.        Marland Kitchen atenolol (TENORMIN) 50 MG tablet Take 1.5 tablets (75 mg total) by mouth daily.  135 tablet  2  . atorvastatin  (LIPITOR) 40 MG tablet Take 1 tablet (40 mg total) by mouth daily.  90 tablet  1  . brimonidine (ALPHAGAN) 0.2 % ophthalmic solution 1 drop 2 (two) times daily.       . clopidogrel (PLAVIX) 75 MG tablet Take 1 tablet (75 mg total) by mouth daily.  90 tablet  1  . fexofenadine (ALLEGRA) 180 MG tablet Take 180 mg by mouth daily as needed. For allergy      . latanoprost (XALATAN) 0.005 % ophthalmic solution Place 1 drop into both eyes at bedtime.       Marland Kitchen lisinopril (PRINIVIL,ZESTRIL) 2.5 MG tablet Take 1 tablet (2.5 mg total) by mouth daily.  90 tablet  3  . Multiple Vitamin (MULTIVITAMIN) tablet Take 1 tablet by mouth daily.      Marland Kitchen omega-3 acid ethyl esters (LOVAZA) 1 G capsule Take 1 capsule (1 g total) by mouth 2 (two) times daily.  180 capsule  3  . sitaGLIPtan-metformin (JANUMET) 50-500 MG per tablet Take 1 tablet by mouth daily.         No current facility-administered medications on file prior to visit.    BP 120/66  Pulse 56  Temp(Src) 98.2 F (36.8 C) (Oral)  Ht 5\' 9"  (1.753 m)  Wt 248 lb (112.492 kg)  BMI 36.61 kg/m2  SpO2 96%       Objective:   Physical Exam  Constitutional: He appears well-developed and well-nourished.  HENT:  Head: Normocephalic and atraumatic.  Eyes: Conjunctivae are normal. Pupils are equal, round, and reactive to light.  Neck: Normal range of motion. Neck supple.  Cardiovascular: Normal rate and regular rhythm.   Murmur heard. Pulmonary/Chest: Effort normal and breath sounds normal.  Abdominal: Soft. Bowel sounds are normal.  Skin: Skin is warm and dry.          Assessment & Plan:  Stable Aic in mid 7 and ldl at goal Blood pressure is stable  Follow up with new md  In fall No change in medications   Had tetnus and pneumovax at [facility]

## 2014-01-21 NOTE — Patient Instructions (Signed)
The patient is instructed to continue all medications as prescribed. Schedule followup with check out clerk upon leaving the clinic  

## 2014-01-21 NOTE — Telephone Encounter (Signed)
Relevant patient education mailed to patient.  

## 2014-01-21 NOTE — Progress Notes (Signed)
Pre visit review using our clinic review tool, if applicable. No additional management support is needed unless otherwise documented below in the visit note. 

## 2014-01-21 NOTE — Addendum Note (Signed)
Addended by: Ricard Dillon on: 01/21/2014 11:07 AM   Modules accepted: Orders

## 2014-02-13 ENCOUNTER — Telehealth: Payer: Self-pay

## 2014-02-13 NOTE — Telephone Encounter (Signed)
Relevant patient education mailed to patient.  

## 2014-03-12 ENCOUNTER — Telehealth: Payer: Self-pay | Admitting: Internal Medicine

## 2014-03-12 NOTE — Telephone Encounter (Signed)
Pt states he is returning call to [facility] regarding he and his wife transferring to Dr. Danise Mina at the [facility] office.  Please return call to patient with the status of this request.

## 2014-03-18 NOTE — Telephone Encounter (Signed)
appt for wife and pt have been scheduled

## 2014-03-23 LAB — HM DIABETES EYE EXAM

## 2014-03-25 ENCOUNTER — Ambulatory Visit (INDEPENDENT_AMBULATORY_CARE_PROVIDER_SITE_OTHER): Payer: Medicare Other | Admitting: Cardiovascular Disease

## 2014-03-25 VITALS — BP 132/76 | HR 55 | Ht 69.0 in | Wt 251.5 lb

## 2014-03-25 DIAGNOSIS — E785 Hyperlipidemia, unspecified: Secondary | ICD-10-CM

## 2014-03-25 DIAGNOSIS — G4730 Sleep apnea, unspecified: Secondary | ICD-10-CM

## 2014-03-25 DIAGNOSIS — I2510 Atherosclerotic heart disease of native coronary artery without angina pectoris: Secondary | ICD-10-CM

## 2014-03-25 DIAGNOSIS — E669 Obesity, unspecified: Secondary | ICD-10-CM

## 2014-03-25 DIAGNOSIS — I10 Essential (primary) hypertension: Secondary | ICD-10-CM

## 2014-03-25 NOTE — Patient Instructions (Addendum)
Your physician has recommended you make the following change in your medication: decrease the atorvastatin to 20 mg daily.   Your physician recommends that you schedule a follow-up appointment in: 6 months

## 2014-04-17 ENCOUNTER — Encounter: Payer: Self-pay | Admitting: Cardiovascular Disease

## 2014-04-17 DIAGNOSIS — I25110 Atherosclerotic heart disease of native coronary artery with unstable angina pectoris: Secondary | ICD-10-CM | POA: Insufficient documentation

## 2014-04-17 DIAGNOSIS — I2510 Atherosclerotic heart disease of native coronary artery without angina pectoris: Secondary | ICD-10-CM | POA: Insufficient documentation

## 2014-04-17 NOTE — Progress Notes (Signed)
Patient ID: Evan Avery, male   DOB: 10-01-1946, 68 y.o.   MRN: [id number]      HPI: Evan Avery is a 68 y.o. male who presents today for a 6 month cardiology evaluation.  Evan Avery has known coronary artery disease and has a total of 4 stents  in his RCA. In 1992, he underwent initial PTCA of his right coronary artery. Subsequently, he underwent stenting of the proximal and mid RCA.  In 1999 his fourth stent was placed in the mid RCA between the 2 previously placed stents and was a 3.5x28 mm non-DES Liberty stent. His last nuclear perfusion study in August 2012 showed inferior thinning without significant ischemia.  EF was not calculated secondary to  ectopy but previously had been 52%.  Additional problems include hypertension, hyperlipidemia, obesity, and obstructive sleep apnea for which he is he uses CPAP 100% of the time and uses  [employer] for his [employer].  Presently, he denies recurrent chest pain symptoms.  He patient is.  He denies shortness of breath.  He does have some mild lightheadedness if he bends over. He did undergo left knee surgery last October.   Past Medical History  Diagnosis Date  . Hypertension   . Diabetes mellitus   . Hyperlipidemia   . CAD (coronary artery disease)     4 stents in right artery  . Myocardial infarction     1991  . Sleep apnea     uses CPAP regularly  . Complex tear of medial meniscus of left knee as current injury 07/26/2012    Past Surgical History  Procedure Laterality Date  . Stents 2 times for vessel disease-kelly      4 vessels  . Appendectomy      at age 2  . Doppler echocardiography  08/20/2008    EF >05%; LV systolic function normal; LV mildly dilated; doppler flow suggestive of impaired LV relaxation; RV mildly dilated, RV systolic function normal, RV systolic pressure normal  . Cardiovascular stress test  06/16/2011    R/P MV - moderate perfusion defect in basal inferoseptal, basal inferior, mid  inferseptal and mid inferior regions, consistent with infarct/scar and/or diaphragmatic attenuation; non-gated study secondary to ectopy; no CP or EKG changes for ischemia; abnormal study although no significant changes from previous study; in the absense of gated images cannot calculate EF or distinguish scar/artifact  . Cardiac catheterization  08/11/2008    angiographically patent LAD diagonal, circumflex and multiple circumflex obtuse marginal branches from the L coronary artery; stenting of mid R coronary artery with 3.5 x 72mm Liberte non DES postdilated w/ 4.0 DuraStent    Allergies  Allergen Reactions  . Niacin And Related     Current Outpatient Prescriptions  Medication Sig Dispense Refill  . aspirin 81 MG tablet Take 81 mg by mouth daily.        Marland Kitchen atenolol (TENORMIN) 50 MG tablet Take 1.5 tablets (75 mg total) by mouth daily.  135 tablet  2  . atorvastatin (LIPITOR) 40 MG tablet Take by mouth daily. Take 1/2 tablet daily      . brimonidine (ALPHAGAN) 0.2 % ophthalmic solution 1 drop 2 (two) times daily.       . clopidogrel (PLAVIX) 75 MG tablet Take 1 tablet (75 mg total) by mouth daily.  90 tablet  1  . latanoprost (XALATAN) 0.005 % ophthalmic solution Place 1 drop into both eyes at bedtime.       Marland Kitchen lisinopril (PRINIVIL,ZESTRIL)  2.5 MG tablet Take 1 tablet (2.5 mg total) by mouth daily.  90 tablet  3  . Multiple Vitamin (MULTIVITAMIN) tablet Take 1 tablet by mouth daily.      Marland Kitchen omega-3 acid ethyl esters (LOVAZA) 1 G capsule Take 1 capsule (1 g total) by mouth 2 (two) times daily.  180 capsule  3  . sitaGLIPtan-metformin (JANUMET) 50-500 MG per tablet Take 1 tablet by mouth daily.         No current facility-administered medications for this visit.    Socially he is married has 2 children 2 grandchildren. He does walk several times a week. He does have a tobacco history. There is no alcohol use.   ROS General: Negative; No fevers, chills, or night sweats;  HEENT: Negative; No  changes in vision or hearing, sinus congestion, difficulty swallowing Pulmonary: Negative; No cough, wheezing, shortness of breath, hemoptysis Cardiovascular: Negative; No chest pain, presyncope, syncope, palpatations GI: Negative; No nausea, vomiting, diarrhea, or abdominal pain GU: Negative; No dysuria, hematuria, or difficulty voiding Musculoskeletal: He is status post left knee surgery; no myalgias, joint pain, or weakness Hematologic/Oncology: Negative; no easy bruising, bleeding Endocrine: Negative; no heat/cold intolerance; no diabetes Neuro: Negative; no changes in balance, headaches Skin: Negative; No rashes or skin lesions Psychiatric: Negative; No behavioral problems, depression Sleep: Positive for sleep apnea, on CPAP with 100% compliance. No snoring, daytime sleepiness, hypersomnolence, bruxism, restless legs, hypnogognic hallucinations, no cataplexy Other comprehensive 14 point system review is negative.   PE BP 132/76  Pulse 55  Ht 5\' 9"  (1.753 m)  Wt 251 lb 8 oz (114.08 kg)  BMI 37.12 kg/m2  General: Alert, oriented, no distress.  Skin: normal turgor, no rashes HEENT: Normocephalic, atraumatic. Pupils round and reactive; sclera anicteric;no lid lag,  Nose without nasal septal hypertrophy Mouth/Parynx benign; Mallinpatti scale 3/4 Neck: The neck; No JVD, no carotid bruits with normal carotid upstroke Chest: No tenderness to palpation  Lungs: clear to ausculatation and percussion; no wheezing or rales Heart: RRR, s1 s2 normal 1/6 systolic murmur; no diastolic murmur.  No S3 or S4 gallop.  No rubs, thrills or heaves.  Abdomen: Moderate central adiposity; soft, nontender; no hepatosplenomehaly, BS+; abdominal aorta nontender and not dilated by palpation. Back: No CVA tenderness  Pulses 2+ Extremities: no clubbing cyanosis or edema, Homan's sign negative.  Neurologic: grossly nonfocal; cranial nerves intact  Psychologic: Normal affect and mood  ECG (independently read  by me):  sinus bradycardia at 55 QTc interval 447 ms.  PR interval 174 ms.  Nondiagnostic T changes in lead 3.  ECG: Sinus rhythm at 56 beats per minute. No ectopy. Normal intervals; nonspecific T changes in lead 3.  LABS:  BMET    Component Value Date/Time   NA 137 09/17/2013 0949   K 3.9 09/17/2013 0949   CL 105 09/17/2013 0949   CO2 26 09/17/2013 0949   GLUCOSE 186* 09/17/2013 0949   BUN 14 09/17/2013 0949   CREATININE 1.0 09/17/2013 0949   CALCIUM 9.4 09/17/2013 0949   GFRNONAA 87* 07/25/2012 1400   GFRAA >90 07/25/2012 1400     Hepatic Function Panel     Component Value Date/Time   PROT 7.3 09/17/2013 0949   ALBUMIN 4.2 09/17/2013 0949   AST 20 09/17/2013 0949   ALT 28 09/17/2013 0949   ALKPHOS 58 09/17/2013 0949   BILITOT 0.7 09/17/2013 0949   BILIDIR 0.0 05/09/2013 1105     CBC    Component Value Date/Time   WBC  11.3* 05/29/2013 1724   RBC 4.17* 05/29/2013 1724   HGB 13.3 05/29/2013 1724   HCT 39.4 05/29/2013 1724   PLT 116.0* 05/29/2013 1724   MCV 94.6 05/29/2013 1724   MCH 31.6 10/13/2011 0700   MCHC 33.7 05/29/2013 1724   RDW 13.5 05/29/2013 1724   LYMPHSABS 0.8 05/29/2013 1724   MONOABS 0.4 05/29/2013 1724   EOSABS 0.0 05/29/2013 1724   BASOSABS 0.1 05/29/2013 1724     BNP No results found for this basename: probnp    Lipid Panel     Component Value Date/Time   CHOL 139 09/17/2013 0949   TRIG 301.0* 09/17/2013 0949   HDL 33.20* 09/17/2013 0949   CHOLHDL 4 09/17/2013 0949   VLDL 60.2* 09/17/2013 0949   LDLCALC 61 05/09/2013 1105     RADIOLOGY: No results found.    ASSESSMENT AND PLAN: Mr. Endicott  is  23 years since his initial PTCA to his right coronary artery. His last catheterization was in October 2009 when his fourth stent was placed in his RCA. Presently he is without anginal symptoms or significant shortness of breath. His blood pressure is well-controlled today on lisinopril 2.5 mg a day,and  atenolol 75 mg. He is on Lipitor 40 mg for hyperlipidemia.  He remains on  dual antiplatelet therapy with aspirin 81 mg and Plavix 75 mg with his multiple stents. Clinically he is without chest pain or anginal symptoms.  When I last saw him, he was started on lovaza for his hypertriglyceridemia.  He has noted some mild joint discomfort.  I am suggesting he decreases atorvastatin to 20 mg because of this and initiate coenzyme Q 10 300 mg daily.  He is on general medical for his diabetes mellitus.  He continues to use CPAP with 100% compliant first obstructive sleep apnea.  He is mildly bradycardic, but asymptomatic on his atenolol.  He continues to have moderate obesity with a body mass index of 37.1, and we discussed additional exercise and weight loss.  He tells me he will have laboratory done at the [facility] next week in the last that these be forwarded to our office for my review.  I will see him in 6 months for followup evaluation.  Troy Sine, MD, [facility]  04/17/2014 12:13 PM

## 2014-06-01 ENCOUNTER — Encounter: Payer: Self-pay | Admitting: Family Medicine

## 2014-06-01 ENCOUNTER — Telehealth: Payer: Self-pay | Admitting: Family Medicine

## 2014-06-01 ENCOUNTER — Ambulatory Visit (INDEPENDENT_AMBULATORY_CARE_PROVIDER_SITE_OTHER): Payer: Medicare Other | Admitting: Family Medicine

## 2014-06-01 VITALS — BP 130/66 | HR 60 | Temp 98.1°F | Wt 254.5 lb

## 2014-06-01 DIAGNOSIS — E119 Type 2 diabetes mellitus without complications: Secondary | ICD-10-CM

## 2014-06-01 DIAGNOSIS — E785 Hyperlipidemia, unspecified: Secondary | ICD-10-CM

## 2014-06-01 DIAGNOSIS — I10 Essential (primary) hypertension: Secondary | ICD-10-CM

## 2014-06-01 DIAGNOSIS — I2510 Atherosclerotic heart disease of native coronary artery without angina pectoris: Secondary | ICD-10-CM

## 2014-06-01 DIAGNOSIS — R7989 Other specified abnormal findings of blood chemistry: Secondary | ICD-10-CM

## 2014-06-01 LAB — LIPID PANEL
CHOLESTEROL: 170 mg/dL (ref 0–200)
HDL: 28.5 mg/dL — AB (ref >39.00)
NonHDL: 141.5
Total CHOL/HDL Ratio: 6
Triglycerides: 340 mg/dL — ABNORMAL HIGH (ref 0.0–149.0)
VLDL: 68 mg/dL — AB (ref 0.0–40.0)

## 2014-06-01 LAB — HEMOGLOBIN A1C: HEMOGLOBIN A1C: 8.6 % — AB (ref 4.6–6.5)

## 2014-06-01 LAB — LDL CHOLESTEROL, DIRECT: Direct LDL: 78.8 mg/dL

## 2014-06-01 MED ORDER — METFORMIN HCL 500 MG PO TABS: 500.0000 mg | ORAL_TABLET | Freq: Two times a day (BID) | ORAL | Status: DC

## 2014-06-01 NOTE — Assessment & Plan Note (Signed)
Chronic, uncontrolled with goal A1c <7%. janu met too expensive - will transition to metformin 562m bid, advised to monitor cbg's more closely with change. Reassess control in 5-6 mo. Check feet today, check A1c today.

## 2014-06-01 NOTE — Assessment & Plan Note (Signed)
Chronic, asxs. Followed by Dr. [name] cardiology.

## 2014-06-01 NOTE — Assessment & Plan Note (Signed)
Chronic, stable. Continue regimen. 

## 2014-06-01 NOTE — Patient Instructions (Addendum)
I've sent metformin 500mg  twice daily to [facility] (3 month supply at a time). When you receive this, start taking and stop janumet. Good to meet you today. Call us with quesitons. Blood work today. Return in 5-6 months for medicare wellness visit, sooner if needed.

## 2014-06-01 NOTE — Progress Notes (Signed)
BP 130/66  Pulse 60  Temp(Src) 98.1 F (36.7 C) (Oral)  Wt 254 lb 8 oz (115.44 kg)   CC: transfer of care  Subjective:    Patient ID: Evan Avery, male    DOB: 1945/11/11, 68 y.o.   MRN: [id number]  HPI: Evan Avery is a 68 y.o. male presenting on 06/01/2014 for Establish Care   Prior patient of Dr. Arnoldo Morale.  DM - on janumet for last several years.  Worried about cost of this as previously had been using samples.  Regularly does not check sugars. Compliant with antihyperglycemic regimen which includes: janumet 50/500 once daily.  Denies low sugars or hypoglycemic symptoms.  Denies paresthesias. Last diabetic eye exam 03/2014.  Pneumovax: 10/2012. Lab Results  Component Value Date   HGBA1C 7.7* 09/17/2013   [facility]  CAD s/p 4 stents - followed by Dr. [name]. Saw him 01/2014. On aspirin and plavix daily. asxs from cardio standpoint.  Arthralgias - 07/2012 knee surgery for meniscal injury.  Smoking - 1 ppd for last 50 yrs.  50PYhx. Stopped 2 years in past.  Relevant past medical, surgical, family and social history reviewed and updated as indicated.  Allergies and medications reviewed and updated. Current Outpatient Prescriptions on File Prior to Visit  Medication Sig  . aspirin 81 MG tablet Take 81 mg by mouth daily.    Marland Kitchen atenolol (TENORMIN) 50 MG tablet Take 1.5 tablets (75 mg total) by mouth daily.  Marland Kitchen atorvastatin (LIPITOR) 40 MG tablet Take by mouth daily. Take 1/2 tablet daily  . brimonidine (ALPHAGAN) 0.2 % ophthalmic solution 1 drop 2 (two) times daily.   . clopidogrel (PLAVIX) 75 MG tablet Take 1 tablet (75 mg total) by mouth daily.  Marland Kitchen latanoprost (XALATAN) 0.005 % ophthalmic solution Place 1 drop into both eyes at bedtime.   Marland Kitchen lisinopril (PRINIVIL,ZESTRIL) 2.5 MG tablet Take 1 tablet (2.5 mg total) by mouth daily.  . Multiple Vitamin (MULTIVITAMIN) tablet Take 1 tablet by mouth daily.  Marland Kitchen omega-3 acid ethyl esters (LOVAZA) 1 G capsule Take 1  capsule (1 g total) by mouth 2 (two) times daily.   No current facility-administered medications on file prior to visit.    Review of Systems Per HPI unless specifically indicated above    Objective:    BP 130/66  Pulse 60  Temp(Src) 98.1 F (36.7 C) (Oral)  Wt 254 lb 8 oz (115.44 kg)  Physical Exam  Nursing note and vitals reviewed. Constitutional: He appears well-developed and well-nourished. No distress.  HENT:  Head: Normocephalic and atraumatic.  Mouth/Throat: Oropharynx is clear and moist. No oropharyngeal exudate.  Eyes: Conjunctivae and EOM are normal. Pupils are equal, round, and reactive to light. No scleral icterus.  Neck: Normal range of motion. Neck supple.  Cardiovascular: Normal rate, regular rhythm, normal heart sounds and intact distal pulses.   No murmur heard. Pulmonary/Chest: Effort normal and breath sounds normal. No respiratory distress. He has no wheezes. He has no rales.  Musculoskeletal: He exhibits no edema.  Diabetic foot exam: Normal inspection No skin breakdown No calluses  Normal DP/PT pulses Normal sensation to light tough and monofilament Nails elongated  Lymphadenopathy:    He has no cervical adenopathy.  Skin: Skin is warm and dry. No rash noted.  Psychiatric: He has a normal mood and affect.   Results for orders placed in visit on 06/01/14  HM DIABETES EYE EXAM      Result Value Ref Range   HM  Diabetic Eye Exam No Retinopathy  No Retinopathy      Assessment & Plan:   Problem List Items Addressed This Visit   Hyperlipidemia with target LDL less than 70     Chronic, controlled on lipitor - check FLP today on lower lipitor dose of 1/2 tab of 4m daily.    Essential hypertension     Chronic, stable. Continue regimen.    Diabetes mellitus type 2, uncontrolled, with complications - Primary     Chronic, uncontrolled with goal A1c <7%. janu met too expensive - will transition to metformin 504mbid, advised to monitor cbg's more  closely with change. Reassess control in 5-6 mo. Check feet today, check A1c today.    Relevant Medications      metFORMIN (GLUCOPHAGE) tablet   CAD in native artery     Chronic, asxs. Followed by Dr. [facility].     Other Visit Diagnoses   Other and unspecified hyperlipidemia        Relevant Orders       Lipid panel        Follow up plan: Return in about 5 months (around 11/01/2014), or as needed, for annual exam, prior fasting for blood work.

## 2014-06-01 NOTE — Telephone Encounter (Signed)
Relevant patient education mailed to patient.  

## 2014-06-01 NOTE — Progress Notes (Signed)
Pre visit review using our clinic review tool, if applicable. No additional management support is needed unless otherwise documented below in the visit note. 

## 2014-06-01 NOTE — Assessment & Plan Note (Signed)
Chronic, controlled on lipitor - check FLP today on lower lipitor dose of 1/2 tab of 40mg  daily.

## 2014-06-02 ENCOUNTER — Telehealth: Payer: Self-pay | Admitting: Family Medicine

## 2014-06-02 NOTE — Telephone Encounter (Signed)
Relevant patient education mailed to patient.  

## 2014-06-12 ENCOUNTER — Telehealth: Payer: Self-pay | Admitting: *Deleted

## 2014-06-12 NOTE — Telephone Encounter (Signed)
Patient dropped off sugar log-  AM fasting:  8/15-183 8/16-213 (2 coffees with cream) 8/17-183 8/18-163 8/19-201  He also requests written script for new CPAP mask and supplies faxed to [facility]. [phone].

## 2014-06-15 ENCOUNTER — Other Ambulatory Visit: Payer: Self-pay | Admitting: *Deleted

## 2014-06-15 DIAGNOSIS — G4730 Sleep apnea, unspecified: Secondary | ICD-10-CM

## 2014-06-15 MED ORDER — METFORMIN HCL 1000 MG PO TABS: 1000.0000 mg | ORAL_TABLET | Freq: Two times a day (BID) | ORAL | Status: DC

## 2014-06-15 NOTE — Telephone Encounter (Signed)
Message left for patient to return my call. Orders sent to [facility] for CPAP supplies.

## 2014-06-15 NOTE — Telephone Encounter (Signed)
For sugars: too high fasting sugars - recommend slow taper up on metformin to 1000mg  bid, watch for GI upset or diarrhea first few days on higher dose and this should improve.  Update [place] with log of fasting sugars in 3-4 weeks. Sent new dose to pharmacy but may double up on 500mg  dose until runs out.  Script for CPAP mask and supplies written and placed in Kim's box

## 2014-06-16 NOTE — Telephone Encounter (Signed)
Message left for patient to return my call.  

## 2014-06-17 ENCOUNTER — Encounter: Payer: Self-pay | Admitting: *Deleted

## 2014-06-17 NOTE — Telephone Encounter (Signed)
Left message on mail to return my call. Letter mailed advising patient.

## 2014-06-18 ENCOUNTER — Other Ambulatory Visit: Payer: Self-pay | Admitting: *Deleted

## 2014-06-18 MED ORDER — CLOPIDOGREL BISULFATE 75 MG PO TABS: 75.0000 mg | ORAL_TABLET | Freq: Every day | ORAL | Status: DC

## 2014-06-18 MED ORDER — LISINOPRIL 2.5 MG PO TABS: 2.5000 mg | ORAL_TABLET | Freq: Every day | ORAL | Status: DC

## 2014-06-18 MED ORDER — ATORVASTATIN CALCIUM 40 MG PO TABS: 20.0000 mg | ORAL_TABLET | Freq: Every day | ORAL | Status: DC

## 2014-06-18 NOTE — Telephone Encounter (Signed)
Rx was sent to pharmacy electronically. 

## 2014-09-07 ENCOUNTER — Encounter: Payer: Self-pay | Admitting: Family Medicine

## 2014-09-07 ENCOUNTER — Ambulatory Visit (INDEPENDENT_AMBULATORY_CARE_PROVIDER_SITE_OTHER): Payer: Medicare Other | Admitting: Family Medicine

## 2014-09-07 VITALS — BP 122/64 | HR 60 | Temp 97.8°F | Wt 244.8 lb

## 2014-09-07 DIAGNOSIS — M79672 Pain in left foot: Secondary | ICD-10-CM

## 2014-09-07 DIAGNOSIS — M79671 Pain in right foot: Secondary | ICD-10-CM | POA: Insufficient documentation

## 2014-09-07 DIAGNOSIS — Z23 Encounter for immunization: Secondary | ICD-10-CM

## 2014-09-07 DIAGNOSIS — IMO0002 Reserved for concepts with insufficient information to code with codable children: Secondary | ICD-10-CM

## 2014-09-07 DIAGNOSIS — E1165 Type 2 diabetes mellitus with hyperglycemia: Secondary | ICD-10-CM

## 2014-09-07 DIAGNOSIS — E118 Type 2 diabetes mellitus with unspecified complications: Secondary | ICD-10-CM

## 2014-09-07 NOTE — Patient Instructions (Addendum)
Take janumet twice daily until you run out then start metformin 500mg  2 tablets twice a day (for total of 1000mg  twice daily). Next time you go to [place] ask about prevnar (second pneumonia shot). Bring in most frequently used pair of shoes and we will place metatarsal pad in them to see if it will help. If not better, schedule appointment with podiatrist. Return in 1 month for wellness visit.

## 2014-09-07 NOTE — Progress Notes (Signed)
BP 122/64 mmHg  Pulse 60  Temp(Src) 97.8 F (36.6 C) (Oral)  Wt 244 lb 12 oz (111.018 kg)   CC: 81mo f/u visit  Subjective:    Patient ID: Evan Avery, male    DOB: 07-16-1946, 68 y.o.   MRN: [id number]  HPI: Evan Avery is a 68 y.o. male presenting on 09/07/2014 for Follow-up   Actually this visit was scheduled by [facility] but it was scheduled wrongly in 15 min slot, pt aware will need to reschedule AMW.   R>L foot pain longstanding worse after prolonged sitting. Pain all over. Describes sharp stabbing, dull ache, burning tingling. Denies inciting trauma or injury. Denies numbness.   DM - on janumet for several years. This was changed to metformin 500mg  bid 2/2 cost. Pt has not stopped janumet yet. Regularly does not check sugars. Denies low sugars or hypoglycemic symptoms. Denies paresthesias. Last diabetic eye exam 03/2014. Pneumovax: 10/2012. Lab Results  Component Value Date   HGBA1C 8.6* 06/01/2014    Smoking - 1 ppd for last 65 yrs. 50PYhx. Stopped 2 years in past.  CAD s/p 4 stents - followed by Dr. [name]. Saw him 01/2014. On aspirin and plavix daily. asxs from cardio standpoint.  Relevant past medical, surgical, family and social history reviewed and updated as indicated.  Allergies and medications reviewed and updated. Current Outpatient Prescriptions on File Prior to Visit  Medication Sig  . aspirin 81 MG tablet Take 81 mg by mouth daily.    Marland Kitchen atenolol (TENORMIN) 50 MG tablet Take 1.5 tablets (75 mg total) by mouth daily.  Marland Kitchen atorvastatin (LIPITOR) 40 MG tablet Take 0.5 tablets (20 mg total) by mouth daily. Take 1/2 tablet daily  . brimonidine (ALPHAGAN) 0.2 % ophthalmic solution 1 drop 2 (two) times daily.   . clopidogrel (PLAVIX) 75 MG tablet Take 1 tablet (75 mg total) by mouth daily.  . Coenzyme Q10 200 MG capsule Take 200 mg by mouth daily.  . Glucosamine-Chondroitin-Vit D3 1500-1200-800 MG-MG-UNIT PACK Take 1 tablet by mouth daily.  Marland Kitchen  latanoprost (XALATAN) 0.005 % ophthalmic solution Place 1 drop into both eyes at bedtime.   Marland Kitchen lisinopril (PRINIVIL,ZESTRIL) 2.5 MG tablet Take 1 tablet (2.5 mg total) by mouth daily.  . Multiple Vitamin (MULTIVITAMIN) tablet Take 1 tablet by mouth daily.  Marland Kitchen omega-3 acid ethyl esters (LOVAZA) 1 G capsule Take 1 capsule (1 g total) by mouth 2 (two) times daily.  . Turmeric 450 MG CAPS Take 1 capsule by mouth daily.  . metFORMIN (GLUCOPHAGE) 1000 MG tablet Take 1 tablet (1,000 mg total) by mouth 2 (two) times daily with a meal.   No current facility-administered medications on file prior to visit.    Review of Systems Per HPI unless specifically indicated above    Objective:    BP 122/64 mmHg  Pulse 60  Temp(Src) 97.8 F (36.6 C) (Oral)  Wt 244 lb 12 oz (111.018 kg)  Physical Exam  Constitutional: He appears well-developed and well-nourished. No distress.  Musculoskeletal: He exhibits no edema.  Diabetic foot exam: Normal inspection- dry skin No skin breakdown No calluses  Normal DP pulses Normal sensation to light touch and monofilament Nails thickened  Tender to palpation along entire MT shafts bilaterally Loss of long and transverse arches bilaterally R>L leading to flat foot R>L  Nursing note and vitals reviewed.  Results for orders placed or performed in visit on 06/01/14  Hemoglobin A1c  Result Value Ref Range   Hgb A1c MFr  Bld 8.6 (H) 4.6 - 6.5 %  Lipid panel  Result Value Ref Range   Cholesterol 170 0 - 200 mg/dL   Triglycerides 340.0 (H) 0.0 - 149.0 mg/dL   HDL 28.50 (L) >39.00 mg/dL   VLDL 68.0 (H) 0.0 - 40.0 mg/dL   Total CHOL/HDL Ratio 6    NonHDL 141.50   LDL cholesterol, direct  Result Value Ref Range   Direct LDL 78.8 mg/dL  HM DIABETES EYE EXAM  Result Value Ref Range   HM Diabetic Eye Exam No Retinopathy No Retinopathy      Assessment & Plan:   Problem List Items Addressed This Visit    Diabetes mellitus type 2, uncontrolled, with complications      Continues janumet - but elevated readings noted recently. Advised increase janumet to bid. Once runs out of janumet samples, discussed starting metformin 500mg  bid and increasing to 1000mg  bid goal. Will reassess control once he is consistently on metformin 1000mg  bid.    Relevant Medications      sitaGLIPtin-metformin (JANUMET) 50-500 MG per tablet   Other Relevant Orders      HM DIABETES FOOT EXAM (Completed)   Bilateral foot pain - Primary    I don't think his pain is related to diabetic neuropathy as lacking numbness, monofilament testing intact. Anticipate more related to loss of normal foot mechanics - with loss of transverse and longitudinal arches and collapse of MT heads R>L.. For this reason I placed him in bilateral MT pads in his shoes, discussed use if this in shoes if effective. If not better, suggested he schedule appt with podiatrist.     Other Visit Diagnoses    Need for influenza vaccination        Relevant Orders       Flu Vaccine QUAD 36+ mos PF IM (Fluarix Quad PF) (Completed)        Follow up plan: Return in about 4 weeks (around 10/05/2014), or as needed, for annual exam, prior fasting for blood work.

## 2014-09-07 NOTE — Assessment & Plan Note (Signed)
Continues janumet - but elevated readings noted recently. Advised increase janumet to bid. Once runs out of janumet samples, discussed starting metformin 500mg  bid and increasing to 1000mg  bid goal. Will reassess control once he is consistently on metformin 1000mg  bid.

## 2014-09-07 NOTE — Progress Notes (Signed)
Pre visit review using our clinic review tool, if applicable. No additional management support is needed unless otherwise documented below in the visit note. 

## 2014-09-07 NOTE — Assessment & Plan Note (Signed)
I don't think his pain is related to diabetic neuropathy as lacking numbness, monofilament testing intact. Anticipate more related to loss of normal foot mechanics - with loss of transverse and longitudinal arches and collapse of MT heads R>L.. For this reason I placed him in bilateral MT pads in his shoes, discussed use if this in shoes if effective. If not better, suggested he schedule appt with podiatrist.

## 2014-09-22 ENCOUNTER — Other Ambulatory Visit: Payer: Self-pay | Admitting: Cardiovascular Disease

## 2014-09-24 NOTE — Telephone Encounter (Signed)
Rx refill sent to patient pharmacy   

## 2014-09-27 ENCOUNTER — Other Ambulatory Visit: Payer: Self-pay | Admitting: Family Medicine

## 2014-09-27 DIAGNOSIS — I10 Essential (primary) hypertension: Secondary | ICD-10-CM

## 2014-09-27 DIAGNOSIS — E669 Obesity, unspecified: Secondary | ICD-10-CM

## 2014-09-27 DIAGNOSIS — E1165 Type 2 diabetes mellitus with hyperglycemia: Secondary | ICD-10-CM

## 2014-09-27 DIAGNOSIS — Z125 Encounter for screening for malignant neoplasm of prostate: Secondary | ICD-10-CM

## 2014-09-27 DIAGNOSIS — D696 Thrombocytopenia, unspecified: Secondary | ICD-10-CM

## 2014-09-27 DIAGNOSIS — E118 Type 2 diabetes mellitus with unspecified complications: Principal | ICD-10-CM

## 2014-09-27 DIAGNOSIS — IMO0002 Reserved for concepts with insufficient information to code with codable children: Secondary | ICD-10-CM

## 2014-09-27 DIAGNOSIS — E785 Hyperlipidemia, unspecified: Secondary | ICD-10-CM

## 2014-09-30 ENCOUNTER — Other Ambulatory Visit (INDEPENDENT_AMBULATORY_CARE_PROVIDER_SITE_OTHER): Payer: Medicare Other

## 2014-09-30 DIAGNOSIS — IMO0002 Reserved for concepts with insufficient information to code with codable children: Secondary | ICD-10-CM

## 2014-09-30 DIAGNOSIS — E785 Hyperlipidemia, unspecified: Secondary | ICD-10-CM

## 2014-09-30 DIAGNOSIS — E118 Type 2 diabetes mellitus with unspecified complications: Secondary | ICD-10-CM

## 2014-09-30 DIAGNOSIS — D696 Thrombocytopenia, unspecified: Secondary | ICD-10-CM

## 2014-09-30 DIAGNOSIS — E1165 Type 2 diabetes mellitus with hyperglycemia: Secondary | ICD-10-CM

## 2014-09-30 DIAGNOSIS — I10 Essential (primary) hypertension: Secondary | ICD-10-CM

## 2014-09-30 DIAGNOSIS — Z125 Encounter for screening for malignant neoplasm of prostate: Secondary | ICD-10-CM

## 2014-09-30 LAB — CBC WITH DIFFERENTIAL/PLATELET
BASOS PCT: 0.7 % (ref 0.0–3.0)
Basophils Absolute: 0.1 10*3/uL (ref 0.0–0.1)
Eosinophils Absolute: 0.3 10*3/uL (ref 0.0–0.7)
Eosinophils Relative: 3.4 % (ref 0.0–5.0)
HCT: 42.6 % (ref 39.0–52.0)
Hemoglobin: 14.8 g/dL (ref 13.0–17.0)
Lymphocytes Relative: 23.5 % (ref 12.0–46.0)
Lymphs Abs: 2.2 10*3/uL (ref 0.7–4.0)
MCHC: 34.6 g/dL (ref 30.0–36.0)
MCV: 92.9 fl (ref 78.0–100.0)
MONOS PCT: 7.4 % (ref 3.0–12.0)
Monocytes Absolute: 0.7 10*3/uL (ref 0.1–1.0)
NEUTROS PCT: 65 % (ref 43.0–77.0)
Neutro Abs: 6.1 10*3/uL (ref 1.4–7.7)
PLATELETS: 160 10*3/uL (ref 150.0–400.0)
RBC: 4.59 Mil/uL (ref 4.22–5.81)
RDW: 13 % (ref 11.5–15.5)
WBC: 9.4 10*3/uL (ref 4.0–10.5)

## 2014-09-30 LAB — BASIC METABOLIC PANEL
BUN: 22 mg/dL (ref 6–23)
CHLORIDE: 102 meq/L (ref 96–112)
CO2: 25 meq/L (ref 19–32)
Calcium: 9.4 mg/dL (ref 8.4–10.5)
Creatinine, Ser: 1.1 mg/dL (ref 0.4–1.5)
GFR: 74.4 mL/min (ref >60.00)
GLUCOSE: 191 mg/dL — AB (ref 70–99)
Potassium: 4.5 mEq/L (ref 3.5–5.1)
SODIUM: 136 meq/L (ref 135–145)

## 2014-09-30 LAB — LIPID PANEL
CHOL/HDL RATIO: 6
CHOLESTEROL: 183 mg/dL (ref 0–200)
HDL: 28.5 mg/dL — ABNORMAL LOW (ref >39.00)
NonHDL: 154.5
Triglycerides: 584 mg/dL — ABNORMAL HIGH (ref 0.0–149.0)
VLDL: 116.8 mg/dL — AB (ref 0.0–40.0)

## 2014-09-30 LAB — PSA, MEDICARE: PSA: 0.47 ng/ml (ref 0.10–4.00)

## 2014-09-30 LAB — LDL CHOLESTEROL, DIRECT: Direct LDL: 72.9 mg/dL

## 2014-09-30 LAB — HEMOGLOBIN A1C: HEMOGLOBIN A1C: 8.3 % — AB (ref 4.6–6.5)

## 2014-10-07 ENCOUNTER — Encounter: Payer: Self-pay | Admitting: Family Medicine

## 2014-10-07 ENCOUNTER — Ambulatory Visit (INDEPENDENT_AMBULATORY_CARE_PROVIDER_SITE_OTHER): Payer: Medicare Other | Admitting: Family Medicine

## 2014-10-07 VITALS — BP 130/80 | HR 56 | Temp 97.8°F | Ht 69.0 in | Wt 246.5 lb

## 2014-10-07 DIAGNOSIS — I10 Essential (primary) hypertension: Secondary | ICD-10-CM

## 2014-10-07 DIAGNOSIS — Z0000 Encounter for general adult medical examination without abnormal findings: Secondary | ICD-10-CM | POA: Insufficient documentation

## 2014-10-07 DIAGNOSIS — Z87891 Personal history of nicotine dependence: Secondary | ICD-10-CM | POA: Insufficient documentation

## 2014-10-07 DIAGNOSIS — I2510 Atherosclerotic heart disease of native coronary artery without angina pectoris: Secondary | ICD-10-CM

## 2014-10-07 DIAGNOSIS — E785 Hyperlipidemia, unspecified: Secondary | ICD-10-CM

## 2014-10-07 DIAGNOSIS — IMO0002 Reserved for concepts with insufficient information to code with codable children: Secondary | ICD-10-CM

## 2014-10-07 DIAGNOSIS — F17200 Nicotine dependence, unspecified, uncomplicated: Secondary | ICD-10-CM | POA: Insufficient documentation

## 2014-10-07 DIAGNOSIS — Z1211 Encounter for screening for malignant neoplasm of colon: Secondary | ICD-10-CM

## 2014-10-07 DIAGNOSIS — E118 Type 2 diabetes mellitus with unspecified complications: Secondary | ICD-10-CM

## 2014-10-07 DIAGNOSIS — Z7189 Other specified counseling: Secondary | ICD-10-CM | POA: Insufficient documentation

## 2014-10-07 DIAGNOSIS — E1165 Type 2 diabetes mellitus with hyperglycemia: Secondary | ICD-10-CM

## 2014-10-07 NOTE — Assessment & Plan Note (Signed)

## 2014-10-07 NOTE — Assessment & Plan Note (Signed)
LDL stable. Very elevated triglycerides - pt attributes to fried chicken and ice cream night prior to blood work. Return in 1 mo for rehceck labs.

## 2014-10-07 NOTE — Assessment & Plan Note (Signed)
Return 3-4 mo for recheck DM.

## 2014-10-07 NOTE — Progress Notes (Signed)
Pre visit review using our clinic review tool, if applicable. No additional management support is needed unless otherwise documented below in the visit note. 

## 2014-10-07 NOTE — Assessment & Plan Note (Signed)
Preventative protocols reviewed and updated unless pt declined. Discussed healthy diet and lifestyle.  

## 2014-10-07 NOTE — Assessment & Plan Note (Signed)
Stable, asxs. 

## 2014-10-07 NOTE — Progress Notes (Signed)
BP 130/80 mmHg  Pulse 56  Temp(Src) 97.8 F (36.6 C) (Oral)  Ht _0  (1.753 m)  Wt 246 lb 8 oz (111.812 kg)  BMI 36.39 kg/m2   CC: medicare wellness Subjective:    Patient ID: Evan Avery, male    DOB: [birth year], 68 y.o.   MRN: [id number]  HPI: Evan Avery is a 68 y.o. male presenting on 10/07/2014 for Annual Exam   Smoking 1 ppd. Pre-contemplative to contemplative.   Hearing screen - passed Vision screen - with eye doctor 05/2014 Fall risk screen - negative Depression screen - negative  Preventative: Colon cancer screening - stool kit today Prostate cancer screening - discussed, may screen Q 2-3 yrs.  Flu shot - 08/2014 Tdap 2014 Pneumonia shot - 10/2012 presumed pneumovax.  Shingles shot - declines. Never had chicken pox.  Advanced directive discussion - does not want prolonged life support. Would want wife to be HCPOA. Packet provided today.  Lives with wife and son (with mental issues) Occupation: retired, was Multimedia programmer for [employer] Activity: no regular exercise Diet: good water, fruits/vegetables daily  Relevant past medical, surgical, family and social history reviewed and updated as indicated. Interim medical history since our last visit reviewed. Allergies and medications reviewed and updated.  Current Outpatient Prescriptions on File Prior to Visit  Medication Sig  . aspirin 81 MG tablet Take 81 mg by mouth daily.    Marland Kitchen atenolol (TENORMIN) 50 MG tablet TAKE 1 AND 1/2 BY MOUTH DAILY  . atorvastatin (LIPITOR) 40 MG tablet Take 0.5 tablets (20 mg total) by mouth daily. Take 1/2 tablet daily  . brimonidine (ALPHAGAN) 0.2 % ophthalmic solution 1 drop 2 (two) times daily.   . clopidogrel (PLAVIX) 75 MG tablet Take 1 tablet (75 mg total) by mouth daily.  . Coenzyme Q10 200 MG capsule Take 200 mg by mouth daily.  . Glucosamine-Chondroitin-Vit D3 1500-1200-800 MG-MG-UNIT PACK Take 1 tablet by mouth daily.  Marland Kitchen latanoprost (XALATAN) 0.005 % ophthalmic  solution Place 1 drop into both eyes at bedtime.   Marland Kitchen lisinopril (PRINIVIL,ZESTRIL) 2.5 MG tablet Take 1 tablet (2.5 mg total) by mouth daily.  . Multiple Vitamin (MULTIVITAMIN) tablet Take 1 tablet by mouth daily.  Marland Kitchen omega-3 acid ethyl esters (LOVAZA) 1 G capsule TAKE 1 BY MOUTH TWICE DAILY  . sitaGLIPtin-metformin (JANUMET) 50-500 MG per tablet Take 1 tablet by mouth daily.  . Turmeric 450 MG CAPS Take 1 capsule by mouth daily.  . metFORMIN (GLUCOPHAGE) 1000 MG tablet Take 1 tablet (1,000 mg total) by mouth 2 (two) times daily with a meal. (Patient not taking: Reported on 10/07/2014)   No current facility-administered medications on file prior to visit.    Review of Systems  Constitutional: Negative for fever, chills, activity change, appetite change, fatigue and unexpected weight change.  HENT: Negative for hearing loss.   Eyes: Negative for visual disturbance.  Respiratory: Negative for cough, chest tightness, shortness of breath and wheezing.   Cardiovascular: Negative for chest pain, palpitations and leg swelling.  Gastrointestinal: Negative for nausea, vomiting, abdominal pain, diarrhea, constipation, blood in stool and abdominal distention.  Genitourinary: Negative for hematuria and difficulty urinating.  Musculoskeletal: Negative for myalgias, arthralgias and neck pain.  Skin: Negative for rash.  Neurological: Negative for dizziness, seizures, syncope and headaches.  Hematological: Negative for adenopathy. Does not bruise/bleed easily.  Psychiatric/Behavioral: Negative for dysphoric mood. The patient is not nervous/anxious.    Per HPI unless specifically indicated above     Objective:  BP 130/80 mmHg  Pulse 56  Temp(Src) 97.8 F (36.6 C) (Oral)  Ht _0  (1.753 m)  Wt 246 lb 8 oz (111.812 kg)  BMI 36.39 kg/m2  Wt Readings from Last 3 Encounters:  10/07/14 246 lb 8 oz (111.812 kg)  09/07/14 244 lb 12 oz (111.018 kg)  06/01/14 254 lb 8 oz (115.44 kg)    Physical Exam   Constitutional: He is oriented to person, place, and time. He appears well-developed and well-nourished. No distress.  morbidly obese Body mass index is 36.39 kg/(m^2).   HENT:  Head: Normocephalic and atraumatic.  Right Ear: Hearing, tympanic membrane, external ear and ear canal normal.  Left Ear: Hearing, tympanic membrane, external ear and ear canal normal.  Nose: Nose normal.  Mouth/Throat: Uvula is midline, oropharynx is clear and moist and mucous membranes are normal. No oropharyngeal exudate, posterior oropharyngeal edema or posterior oropharyngeal erythema.  Eyes: Conjunctivae and EOM are normal. Pupils are equal, round, and reactive to light. No scleral icterus.  Neck: Normal range of motion. Neck supple. Carotid bruit is not present. No thyromegaly present.  Cardiovascular: Normal rate, regular rhythm, normal heart sounds and intact distal pulses.   No murmur heard. Pulses:      Radial pulses are 2+ on the right side, and 2+ on the left side.  Pulmonary/Chest: Effort normal and breath sounds normal. No respiratory distress. He has no wheezes. He has no rales.  Abdominal: Soft. Bowel sounds are normal. He exhibits no distension and no mass. There is no tenderness. There is no rebound and no guarding.  Genitourinary: Rectum normal and prostate normal. Rectal exam shows no external hemorrhoid, no internal hemorrhoid, no fissure, no mass, no tenderness and anal tone normal. Prostate is not enlarged (15gm) and not tender.  Musculoskeletal: Normal range of motion. He exhibits no edema.  Lymphadenopathy:    He has no cervical adenopathy.  Neurological: He is alert and oriented to person, place, and time.  CN grossly intact, station and gait intact Recall 3/3  Calculation 5/5 serial 7s  Skin: Skin is warm and dry. No rash noted.  Psychiatric: He has a normal mood and affect. His behavior is normal. Judgment and thought content normal.  Nursing note and vitals reviewed.  Results for  orders placed or performed in visit on 09/30/14  Lipid panel  Result Value Ref Range   Cholesterol 183 0 - 200 mg/dL   Triglycerides 584.0 (H) 0.0 - 149.0 mg/dL   HDL 28.50 (L) >39.00 mg/dL   VLDL 116.8 (H) 0.0 - 40.0 mg/dL   Total CHOL/HDL Ratio 6    NonHDL 154.50   Hemoglobin A1c  Result Value Ref Range   Hgb A1c MFr Bld 8.3 (H) 4.6 - 6.5 %  CBC with Differential  Result Value Ref Range   WBC 9.4 4.0 - 10.5 K/uL   RBC 4.59 4.22 - 5.81 Mil/uL   Hemoglobin 14.8 13.0 - 17.0 g/dL   HCT 42.6 39.0 - 52.0 %   MCV 92.9 78.0 - 100.0 fl   MCHC 34.6 30.0 - 36.0 g/dL   RDW 13.0 11.5 - 15.5 %   Platelets 160.0 150.0 - 400.0 K/uL   Neutrophils Relative % 65.0 43.0 - 77.0 %   Lymphocytes Relative 23.5 12.0 - 46.0 %   Monocytes Relative 7.4 3.0 - 12.0 %   Eosinophils Relative 3.4 0.0 - 5.0 %   Basophils Relative 0.7 0.0 - 3.0 %   Neutro Abs 6.1 1.4 - 7.7 K/uL  Lymphs Abs 2.2 0.7 - 4.0 K/uL   Monocytes Absolute 0.7 0.1 - 1.0 K/uL   Eosinophils Absolute 0.3 0.0 - 0.7 K/uL   Basophils Absolute 0.1 0.0 - 0.1 K/uL  Basic metabolic panel  Result Value Ref Range   Sodium 136 135 - 145 mEq/L   Potassium 4.5 3.5 - 5.1 mEq/L   Chloride 102 96 - 112 mEq/L   CO2 25 19 - 32 mEq/L   Glucose, Bld 191 (H) 70 - 99 mg/dL   BUN 22 6 - 23 mg/dL   Creatinine, Ser 1.1 0.4 - 1.5 mg/dL   Calcium 9.4 8.4 - 10.5 mg/dL   GFR 74.40 >60.00 mL/min  PSA, Medicare  Result Value Ref Range   PSA 0.47 0.10 - 4.00 ng/ml  LDL cholesterol, direct  Result Value Ref Range   Direct LDL 72.9 mg/dL      Assessment & Plan:   Problem List Items Addressed This Visit    Smoker    Continue to encourage cessation. contempltaive.    Medicare annual wellness visit, subsequent - Primary    I have personally reviewed the Medicare Annual Wellness questionnaire and have noted 1. The patient's medical and social history 2. Their use of alcohol, tobacco or illicit drugs 3. Their current medications and supplements 4. The  patient's functional ability including ADL's, fall risks, home safety risks and hearing or visual impairment. 5. Diet and physical activity 6. Evidence for depression or mood disorders The patients weight, height, BMI have been recorded in the chart.  Hearing and vision has been addressed. I have made referrals, counseling and provided education to the patient based review of the above and I have provided the pt with a written personalized care plan for preventive services. Provider list updated - see scanned questionairre. Reviewed preventative protocols and updated unless pt declined.     Hyperlipidemia with target LDL less than 70    LDL stable. Very elevated triglycerides - pt attributes to fried chicken and ice cream night prior to blood work. Return in 1 mo for rehceck labs.    Health maintenance examination    Preventative protocols reviewed and updated unless pt declined. Discussed healthy diet and lifestyle.     Essential hypertension    Chronic, stable. Continue regien.    Diabetes mellitus type 2, uncontrolled, with complications    Return 3-4 mo for recheck DM.    CAD in native artery    Stable, asxs.    Advanced care planning/counseling discussion    Advanced directive discussion - does not want prolonged life support. Would want wife to be HCPOA. Packet provided today.     Other Visit Diagnoses    Special screening for malignant neoplasms, colon        Relevant Orders       Fecal occult blood, imunochemical        Follow up plan: Return in about 3 months (around 01/06/2015), or as needed, for annual exam, prior fasting for blood work.

## 2014-10-07 NOTE — Assessment & Plan Note (Signed)
Continue to encourage cessation. contempltaive.

## 2014-10-07 NOTE — Assessment & Plan Note (Signed)
Advanced directive discussion - does not want prolonged life support. Would want wife to be HCPOA. Packet provided today.

## 2014-10-07 NOTE — Assessment & Plan Note (Signed)
Chronic, stable. Continue regien.

## 2014-10-07 NOTE — Patient Instructions (Addendum)
Stool kit today. [facility]. Good to see you today. Call us with questions. Return for follow up visit in 3-4 months  Return in 1 month for fasting labs (triglyceride check)

## 2014-10-23 LAB — HM DIABETES EYE EXAM

## 2014-10-26 ENCOUNTER — Other Ambulatory Visit: Payer: Medicare Other

## 2014-10-28 ENCOUNTER — Other Ambulatory Visit: Payer: Self-pay

## 2014-10-28 ENCOUNTER — Encounter: Payer: Self-pay | Admitting: *Deleted

## 2014-10-28 DIAGNOSIS — Z1211 Encounter for screening for malignant neoplasm of colon: Secondary | ICD-10-CM

## 2014-10-28 LAB — FECAL OCCULT BLOOD, GUAIAC: Fecal Occult Blood: NEGATIVE

## 2014-10-28 LAB — FECAL OCCULT BLOOD, IMMUNOCHEMICAL: Fecal Occult Bld: NEGATIVE

## 2014-10-29 ENCOUNTER — Telehealth: Payer: Self-pay | Admitting: Family Medicine

## 2014-10-29 NOTE — Telephone Encounter (Signed)
I'd like him to come in for fasting labs only this month to recheck triglycerides and then f/u in April.

## 2014-10-29 NOTE — Telephone Encounter (Signed)
Pt stopped in to inquire about his bill and afterwards he inquired about a lab apptmt he has scheduled 11/09/2014.  His follow up w/you is 02/08/2015 and he wonders if he should come in for labs closer to that date or is the January lab a re-test that he needs to come in to have done? Pt requests a c/b. He says we can either call his cell [phone] or his home [phone]. Thank you.

## 2014-10-29 NOTE — Telephone Encounter (Signed)
Patient notified

## 2014-11-02 ENCOUNTER — Encounter: Payer: Medicare Other | Admitting: Family Medicine

## 2014-11-07 ENCOUNTER — Encounter: Payer: Self-pay | Admitting: Family Medicine

## 2014-11-07 ENCOUNTER — Other Ambulatory Visit: Payer: Self-pay | Admitting: Family Medicine

## 2014-11-07 DIAGNOSIS — E785 Hyperlipidemia, unspecified: Secondary | ICD-10-CM

## 2014-11-09 ENCOUNTER — Other Ambulatory Visit (INDEPENDENT_AMBULATORY_CARE_PROVIDER_SITE_OTHER): Payer: PPO

## 2014-11-09 DIAGNOSIS — E785 Hyperlipidemia, unspecified: Secondary | ICD-10-CM

## 2014-11-09 LAB — LIPID PANEL
CHOL/HDL RATIO: 5
CHOLESTEROL: 149 mg/dL (ref 0–200)
HDL: 27.6 mg/dL — ABNORMAL LOW (ref >39.00)
Triglycerides: 446 mg/dL — ABNORMAL HIGH (ref 0.0–149.0)

## 2014-11-09 LAB — LDL CHOLESTEROL, DIRECT: LDL DIRECT: 54 mg/dL

## 2014-11-10 ENCOUNTER — Other Ambulatory Visit: Payer: Self-pay | Admitting: Family Medicine

## 2014-11-10 MED ORDER — OMEGA-3-ACID ETHYL ESTERS 1 G PO CAPS: 2.0000 g | ORAL_CAPSULE | Freq: Two times a day (BID) | ORAL | Status: DC

## 2014-11-27 ENCOUNTER — Other Ambulatory Visit: Payer: Self-pay

## 2014-11-27 MED ORDER — OMEGA-3-ACID ETHYL ESTERS 1 G PO CAPS: 2.0000 g | ORAL_CAPSULE | Freq: Two times a day (BID) | ORAL | Status: DC

## 2014-11-27 NOTE — Telephone Encounter (Signed)
Pt left v/m wanting omega 3 sent to [facility] [address]pts ins has changed and no longer using [facility]; pt contacted [facility] to stop order for omega 3 advised pt done.

## 2014-12-01 ENCOUNTER — Other Ambulatory Visit: Payer: Self-pay | Admitting: *Deleted

## 2014-12-01 MED ORDER — METFORMIN HCL 1000 MG PO TABS: 1000.0000 mg | ORAL_TABLET | Freq: Two times a day (BID) | ORAL | Status: DC

## 2014-12-10 ENCOUNTER — Other Ambulatory Visit: Payer: Self-pay | Admitting: *Deleted

## 2014-12-10 MED ORDER — ATORVASTATIN CALCIUM 40 MG PO TABS: 20.0000 mg | ORAL_TABLET | Freq: Every day | ORAL | Status: DC

## 2014-12-10 MED ORDER — CLOPIDOGREL BISULFATE 75 MG PO TABS: 75.0000 mg | ORAL_TABLET | Freq: Every day | ORAL | Status: DC

## 2014-12-10 MED ORDER — ATENOLOL 50 MG PO TABS: ORAL_TABLET | ORAL | Status: DC

## 2015-02-08 ENCOUNTER — Encounter: Payer: Self-pay | Admitting: Family Medicine

## 2015-02-08 ENCOUNTER — Ambulatory Visit (INDEPENDENT_AMBULATORY_CARE_PROVIDER_SITE_OTHER): Payer: PPO | Admitting: Family Medicine

## 2015-02-08 VITALS — BP 130/70 | HR 56 | Temp 97.8°F | Wt 247.8 lb

## 2015-02-08 DIAGNOSIS — E118 Type 2 diabetes mellitus with unspecified complications: Secondary | ICD-10-CM | POA: Diagnosis not present

## 2015-02-08 DIAGNOSIS — E1165 Type 2 diabetes mellitus with hyperglycemia: Secondary | ICD-10-CM

## 2015-02-08 DIAGNOSIS — IMO0002 Reserved for concepts with insufficient information to code with codable children: Secondary | ICD-10-CM

## 2015-02-08 DIAGNOSIS — H401132 Primary open-angle glaucoma, bilateral, moderate stage: Secondary | ICD-10-CM | POA: Insufficient documentation

## 2015-02-08 DIAGNOSIS — F17200 Nicotine dependence, unspecified, uncomplicated: Secondary | ICD-10-CM

## 2015-02-08 DIAGNOSIS — Z720 Tobacco use: Secondary | ICD-10-CM

## 2015-02-08 DIAGNOSIS — H409 Unspecified glaucoma: Secondary | ICD-10-CM | POA: Insufficient documentation

## 2015-02-08 DIAGNOSIS — I10 Essential (primary) hypertension: Secondary | ICD-10-CM

## 2015-02-08 DIAGNOSIS — E785 Hyperlipidemia, unspecified: Secondary | ICD-10-CM

## 2015-02-08 LAB — BASIC METABOLIC PANEL
BUN: 13 mg/dL (ref 6–23)
CO2: 25 mEq/L (ref 19–32)
Calcium: 9.9 mg/dL (ref 8.4–10.5)
Chloride: 104 mEq/L (ref 96–112)
Creatinine, Ser: 0.96 mg/dL (ref 0.40–1.50)
GFR: 82.42 mL/min (ref >60.00)
GLUCOSE: 250 mg/dL — AB (ref 70–99)
Potassium: 4.2 mEq/L (ref 3.5–5.1)
SODIUM: 137 meq/L (ref 135–145)

## 2015-02-08 LAB — HEMOGLOBIN A1C: HEMOGLOBIN A1C: 8.3 % — AB (ref 4.6–6.5)

## 2015-02-08 LAB — LDL CHOLESTEROL, DIRECT: Direct LDL: 44 mg/dL

## 2015-02-08 NOTE — Assessment & Plan Note (Signed)
Continue to encourage smoking cessation. Contemplative to action phase - working with wife to cut down on smoking.

## 2015-02-08 NOTE — Progress Notes (Signed)
BP 130/70 mmHg  Pulse 56  Temp(Src) 97.8 F (36.6 C) (Oral)  Wt 247 lb 12 oz (112.379 kg)   CC: DM f/u visit  Subjective:    Patient ID: Evan Avery, male    DOB: September 05, 1946, 69 y.o.   MRN: [id number]  HPI: Evan Avery is a 69 y.o. male presenting on 02/08/2015 for Follow-up   DM - regularly does check sugars once weekly. yesterday fasting 220s. Compliant with antihyperglycemic regimen which includes: metformin 1000mg  bid.  Off janumet for last 1-2 months. Denies low sugars or hypoglycemic symptoms.  Denies paresthesias. Last diabetic eye exam 10/2014.  Pneumovax: 10/2012.  Prevnar: DUE - unsure, may have had at [place]. Lab Results  Component Value Date   HGBA1C 8.3* 09/30/2014   Diabetic Foot Exam - Simple   Simple Foot Form  Diabetic Foot exam was performed with the following findings:  Yes 02/08/2015  8:36 AM  Visual Inspection  No deformities, no ulcerations, no other skin breakdown bilaterally:  Yes  Sensation Testing  Intact to touch and monofilament testing bilaterally:  Yes  Pulse Check  Posterior Tibialis and Dorsalis pulse intact bilaterally:  Yes  Comments      HLD - very elevated trig last visit. Pt states new lipitor dose is 20mg  daily but he still is taking lipitor 40mg  daily (half of 80mg  dose). We increased lovaza to 2 capsules bid last visit as well. Blood work today.  Smoker - down to 1/2-1 ppd. Working with wife to cut down together.   Relevant past medical, surgical, family and social history reviewed and updated as indicated. Interim medical history since our last visit reviewed. Allergies and medications reviewed and updated. Current Outpatient Prescriptions on File Prior to Visit  Medication Sig  . aspirin 81 MG tablet Take 81 mg by mouth daily.    Marland Kitchen atenolol (TENORMIN) 50 MG tablet TAKE 1 AND 1/2 BY MOUTH DAILY  . atorvastatin (LIPITOR) 40 MG tablet Take 0.5 tablets (20 mg total) by mouth daily. Take 1/2 tablet daily  . brimonidine (ALPHAGAN) 0.2  % ophthalmic solution 1 drop 2 (two) times daily.   . clopidogrel (PLAVIX) 75 MG tablet Take 1 tablet (75 mg total) by mouth daily.  . Coenzyme Q10 200 MG capsule Take 200 mg by mouth daily.  . Glucosamine-Chondroitin-Vit D3 1500-1200-800 MG-MG-UNIT PACK Take 1 tablet by mouth daily.  Marland Kitchen latanoprost (XALATAN) 0.005 % ophthalmic solution Place 1 drop into both eyes at bedtime.   Marland Kitchen lisinopril (PRINIVIL,ZESTRIL) 2.5 MG tablet Take 1 tablet (2.5 mg total) by mouth daily.  . metFORMIN (GLUCOPHAGE) 1000 MG tablet Take 1 tablet (1,000 mg total) by mouth 2 (two) times daily with a meal.  . Multiple Vitamin (MULTIVITAMIN) tablet Take 1 tablet by mouth daily.  Marland Kitchen omega-3 acid ethyl esters (LOVAZA) 1 G capsule Take 2 capsules (2 g total) by mouth 2 (two) times daily.  . Turmeric 450 MG CAPS Take 1 capsule by mouth daily.   No current facility-administered medications on file prior to visit.    Review of Systems Per HPI unless specifically indicated above     Objective:    BP 130/70 mmHg  Pulse 56  Temp(Src) 97.8 F (36.6 C) (Oral)  Wt 247 lb 12 oz (112.379 kg)  Wt Readings from Last 3 Encounters:  02/08/15 247 lb 12 oz (112.379 kg)  10/07/14 246 lb 8 oz (111.812 kg)  09/07/14 244 lb 12 oz (111.018 kg)    Physical Exam  Constitutional:  He appears well-developed and well-nourished. No distress.  HENT:  Head: Normocephalic and atraumatic.  Right Ear: External ear normal.  Left Ear: External ear normal.  Nose: Nose normal.  Mouth/Throat: Oropharynx is clear and moist. No oropharyngeal exudate.  Eyes: Conjunctivae and EOM are normal. Pupils are equal, round, and reactive to light. No scleral icterus.  Neck: Normal range of motion. Neck supple.  Cardiovascular: Normal rate, regular rhythm, normal heart sounds and intact distal pulses.   No murmur heard. Pulmonary/Chest: Effort normal and breath sounds normal. No respiratory distress. He has no wheezes. He has no rales.  Musculoskeletal: He  exhibits no edema.  See HPI for foot exam if done  Lymphadenopathy:    He has no cervical adenopathy.  Skin: Skin is warm and dry. No rash noted.  Psychiatric: He has a normal mood and affect.  Nursing note and vitals reviewed.  Results for orders placed or performed in visit on 02/08/15  HM DIABETES EYE EXAM  Result Value Ref Range   HM Diabetic Eye Exam No Retinopathy No Retinopathy      Assessment & Plan:   Problem List Items Addressed This Visit    Smoker    Continue to encourage smoking cessation. Contemplative to action phase - working with wife to cut down on smoking.      Hyperlipidemia with target LDL less than 70    Check dLDL today - not fasting. Goal <70. Currently on lipitor 40mg  daily. Did not check triglycerides but we have recently increased lovaza to 2 cap bid.      Relevant Orders   LDL Cholesterol, Direct   Gl[facility]    [facility]      Essential hypertension    Chronic, stable. continue regimen.      Diabetes mellitus type 2, uncontrolled, with complications - Primary    Glu may be increasing now he's off janumet. Will check A1c and discuss addition of sulfonylurea if above goal <8% (ideally <7% given CAD hx). Foot exam today. Pt will check at [facility] on pneumococcal vaccinations.      Relevant Orders   Hemoglobin S1X   Basic metabolic panel       Follow up plan: Return in about 6 months (around 08/10/2015), or as needed, for medicare wellness visit.

## 2015-02-08 NOTE — Assessment & Plan Note (Signed)
[facility].

## 2015-02-08 NOTE — Assessment & Plan Note (Signed)
Glu may be increasing now he's off janumet. Will check A1c and discuss addition of sulfonylurea if above goal <8% (ideally <7% given CAD hx). Foot exam today. Pt will check at [facility] on pneumococcal vaccinations.

## 2015-02-08 NOTE — Assessment & Plan Note (Signed)
Check dLDL today - not fasting. Goal <70. Currently on lipitor 40mg  daily. Did not check triglycerides but we have recently increased lovaza to 2 cap bid.

## 2015-02-08 NOTE — Progress Notes (Signed)
Pre visit review using our clinic review tool, if applicable. No additional management support is needed unless otherwise documented below in the visit note. 

## 2015-02-08 NOTE — Patient Instructions (Addendum)
Good to see you today, call us with questions. labwork today. Based on results we will discuss changes to cholesterol and diabetes medicines [facility] if you're done with [facility] shots or if you need one more prevnar. Return as needed or in 6 months for medicare wellness visit.

## 2015-02-08 NOTE — Assessment & Plan Note (Signed)
Chronic, stable.continue regimen. 

## 2015-02-10 ENCOUNTER — Other Ambulatory Visit: Payer: Self-pay | Admitting: Family Medicine

## 2015-02-10 MED ORDER — GLIMEPIRIDE 1 MG PO TABS: 1.0000 mg | ORAL_TABLET | Freq: Every day | ORAL | Status: DC

## 2015-02-23 ENCOUNTER — Encounter: Payer: Self-pay | Admitting: Cardiovascular Disease

## 2015-02-23 ENCOUNTER — Ambulatory Visit (INDEPENDENT_AMBULATORY_CARE_PROVIDER_SITE_OTHER): Payer: PPO | Admitting: Cardiovascular Disease

## 2015-02-23 VITALS — BP 130/72 | HR 53 | Ht 69.0 in | Wt 248.2 lb

## 2015-02-23 DIAGNOSIS — E118 Type 2 diabetes mellitus with unspecified complications: Secondary | ICD-10-CM | POA: Diagnosis not present

## 2015-02-23 DIAGNOSIS — I10 Essential (primary) hypertension: Secondary | ICD-10-CM | POA: Diagnosis not present

## 2015-02-23 DIAGNOSIS — IMO0002 Reserved for concepts with insufficient information to code with codable children: Secondary | ICD-10-CM

## 2015-02-23 DIAGNOSIS — E669 Obesity, unspecified: Secondary | ICD-10-CM | POA: Diagnosis not present

## 2015-02-23 DIAGNOSIS — E785 Hyperlipidemia, unspecified: Secondary | ICD-10-CM | POA: Diagnosis not present

## 2015-02-23 DIAGNOSIS — E1165 Type 2 diabetes mellitus with hyperglycemia: Secondary | ICD-10-CM

## 2015-02-23 DIAGNOSIS — I2510 Atherosclerotic heart disease of native coronary artery without angina pectoris: Secondary | ICD-10-CM

## 2015-02-23 NOTE — Patient Instructions (Signed)
Your physician wants you to follow-up in: 1 year or sooner if needed. You will receive a reminder letter in the mail two months in advance. If you don't receive a letter, please call our office to schedule the follow-up appointment.  

## 2015-02-24 ENCOUNTER — Encounter: Payer: Self-pay | Admitting: Cardiovascular Disease

## 2015-02-24 NOTE — Progress Notes (Signed)
Patient ID: Evan Avery, male   DOB: 1946/09/26, 69 y.o.   MRN: [id number]     HPI: Evan Avery is a 69 y.o. male who presents today for an 66 month cardiology evaluation.  Mr. Evan Avery has known CAD and has a total of 4 stents in his RCA. In 1992, he underwent initial PTCA of his RCA. Subsequently, he underwent stenting of the proximal and mid RCA.  In 1999 his fourth stent was placed in the mid RCA between the 2 previously placed stents and was a 3.5x28 mm non-DES Liberty stent. His last nuclear perfusion study in August 2012 showed inferior thinning without significant ischemia.  EF was not calculated secondary to  ectopy but previously had been 52%.  Additional problems include hypertension, hyperlipidemia, obesity, and obstructive sleep apnea for which he is he uses CPAP 100% of the time and uses  [employer] for his [employer].  Inside last saw him almost one year ago, he denies recurrent anginal symptoms or significant shortness of breath.  He has not been very successful with weight loss.  He continues to note some knee discomfort and has trouble with his ankles and feet.  He now is seeing a [facility] primary care [facility] at [facility].  Past Medical History  Diagnosis Date  . Hypertension   . Diabetes mellitus   . Hyperlipidemia   . CAD (coronary artery disease)     4 stents in right artery  . Myocardial infarction     1991  . Sleep apnea     uses CPAP regularly  . Complex tear of medial meniscus of left knee as current injury 07/26/2012  . Glaucoma 2006    on drops, followed by [facility]    Past Surgical History  Procedure Laterality Date  . Percutaneous coronary stent intervention (pci-s)  1992, 1999    4 vessels [name])  . Appendectomy      at age 78  . Doppler echocardiography  08/20/2008    EF >12%; LV systolic function normal; LV mildly dilated; doppler flow suggestive of impaired LV relaxation; RV mildly dilated, RV systolic function normal, RV systolic pressure  normal  . Cardiovascular stress test  06/16/2011    R/P MV - moderate perfusion defect in basal inferoseptal, basal inferior, mid inferseptal and mid inferior regions, consistent with infarct/scar and/or diaphragmatic attenuation; non-gated study secondary to ectopy; no CP or EKG changes for ischemia; abnormal study although no significant changes from previous study; in the absense of gated images cannot calculate EF or distinguish scar/artifact  . Cardiac catheterization  08/11/2008    angiographically patent LAD diagonal, circumflex and multiple circumflex obtuse marginal branches from the L coronary artery; stenting of mid R coronary artery with 3.5 x 71m Liberte non DES postdilated w/ 4.0 DuraStent ([name]    Allergies  Allergen Reactions  . Niacin And Related Other (See Comments)    Extreme fatigue    Current Outpatient Prescriptions  Medication Sig Dispense Refill  . aspirin 81 MG tablet Take 81 mg by mouth daily.      .Marland Kitchenatenolol (TENORMIN) 50 MG tablet TAKE 1 AND 1/2 BY MOUTH DAILY 135 tablet 1  . atorvastatin (LIPITOR) 40 MG tablet Take 0.5 tablets (20 mg total) by mouth daily. Take 1/2 tablet daily 45 tablet 2  . brimonidine (ALPHAGAN) 0.2 % ophthalmic solution 1 drop 2 (two) times daily.     . clopidogrel (PLAVIX) 75 MG tablet Take 1 tablet (75 mg total) by mouth  daily. 90 tablet 2  . Coenzyme Q10 200 MG capsule Take 200 mg by mouth daily.    Marland Kitchen glimepiride (AMARYL) 1 MG tablet Take 1 tablet (1 mg total) by mouth daily with breakfast. 30 tablet 11  . Glucosamine-Chondroitin-Vit D3 1500-1200-800 MG-MG-UNIT PACK Take 1 tablet by mouth daily.    Marland Kitchen latanoprost (XALATAN) 0.005 % ophthalmic solution Place 1 drop into both eyes at bedtime.     Marland Kitchen lisinopril (PRINIVIL,ZESTRIL) 2.5 MG tablet Take 1 tablet (2.5 mg total) by mouth daily. 90 tablet 2  . metFORMIN (GLUCOPHAGE) 1000 MG tablet Take 1 tablet (1,000 mg total) by mouth 2 (two) times daily with a meal. 180 tablet 3  . Multiple  Vitamin (MULTIVITAMIN) tablet Take 1 tablet by mouth daily.    Marland Kitchen omega-3 acid ethyl esters (LOVAZA) 1 G capsule Take 2 capsules (2 g total) by mouth 2 (two) times daily. 360 capsule 3  . Turmeric 450 MG CAPS Take 1 capsule by mouth daily.     No current facility-administered medications for this visit.    Socially he is married has 2 children 2 grandchildren. He does walk several times a week. He does have a tobacco history. There is no alcohol use.   ROS General: Negative; No fevers, chills, or night sweats; positive for obesity. HEENT: Negative; No changes in vision or hearing, sinus congestion, difficulty swallowing Pulmonary: Negative; No cough, wheezing, shortness of breath, hemoptysis Cardiovascular: Negative; No chest pain, presyncope, syncope, palpatations GI: Negative; No nausea, vomiting, diarrhea, or abdominal pain GU: Negative; No dysuria, hematuria, or difficulty voiding Musculoskeletal: He is status post left knee surgery; no myalgias, joint pain, or weakness Hematologic/Oncology: Negative; no easy bruising, bleeding Endocrine: Negative; no heat/cold intolerance; no diabetes Neuro: Negative; no changes in balance, headaches Skin: Negative; No rashes or skin lesions Psychiatric: Negative; No behavioral problems, depression Sleep: Positive for sleep apnea, on CPAP with 100% compliance. No snoring, daytime sleepiness, hypersomnolence, bruxism, restless legs, hypnogognic hallucinations, no cataplexy Other comprehensive 14 point system review is negative.   PE BP 130/72 mmHg  Pulse 53  Ht 5' 9"  (1.753 m)  Wt 248 lb 3.2 oz (112.583 kg)  BMI 36.64 kg/m2   Wt Readings from Last 3 Encounters:  02/23/15 248 lb 3.2 oz (112.583 kg)  02/08/15 247 lb 12 oz (112.379 kg)  10/07/14 246 lb 8 oz (111.812 kg)   General: Alert, oriented, no distress.  Skin: normal turgor, no rashes HEENT: Normocephalic, atraumatic. Pupils round and reactive; sclera anicteric;no lid lag,  Nose  without nasal septal hypertrophy Mouth/Parynx benign; Mallinpatti scale 3/4 Neck: The neck; No JVD, no carotid bruits with normal carotid upstroke Chest: No tenderness to palpation  Lungs: clear to ausculatation and percussion; no wheezing or rales Heart: RRR, s1 s2 normal 1/6 systolic murmur; no diastolic murmur.  No S3 or S4 gallop.  No rubs, thrills or heaves.  Abdomen: Moderate central adiposity; soft, nontender; no hepatosplenomehaly, BS+; abdominal aorta nontender and not dilated by palpation. Back: No CVA tenderness  Pulses 2+ Extremities: no clubbing cyanosis or edema, Homan's sign negative.  Neurologic: grossly nonfocal; cranial nerves intact  Psychologic: Normal affect and mood  ECG (independently read by me): Sinus bradycardia 53 bpm.  Nondiagnostic T changes with T-wave inversion in lead 3.  June 2015 ECG (independently read by me):  sinus bradycardia at 55 QTc interval 447 ms.  PR interval 174 ms.  Nondiagnostic T changes in lead 3.  ECG: Sinus rhythm at 56 beats per minute. No  ectopy. Normal intervals; nonspecific T changes in lead 3.  LABS: BMP Latest Ref Rng 02/08/2015 09/30/2014 09/17/2013  Glucose 70 - 99 mg/dL 250(H) 191(H) 186(H)  BUN 6 - 23 mg/dL 13 22 14   Creatinine 0.40 - 1.50 mg/dL 0.96 1.1 1.0  Sodium 135 - 145 mEq/L 137 136 137  Potassium 3.5 - 5.1 mEq/L 4.2 4.5 3.9  Chloride 96 - 112 mEq/L 104 102 105  CO2 19 - 32 mEq/L 25 25 26   Calcium 8.4 - 10.5 mg/dL 9.9 9.4 9.4   Hepatic Function Latest Ref Rng 09/17/2013 05/09/2013 10/18/2011  Total Protein 6.0 - 8.3 g/dL 7.3 7.0 6.9  Albumin 3.5 - 5.2 g/dL 4.2 4.2 3.8  AST 0 - 37 U/L 20 19 22   ALT 0 - 53 U/L 28 25 31   Alk Phosphatase 39 - 117 U/L 58 70 70  Total Bilirubin 0.3 - 1.2 mg/dL 0.7 0.6 0.3  Bilirubin, Direct 0.0 - 0.3 mg/dL - 0.0 -    CBC Latest Ref Rng 09/30/2014 05/29/2013 05/09/2013  WBC 4.0 - 10.5 K/uL 9.4 11.3(H) 7.6  Hemoglobin 13.0 - 17.0 g/dL 14.8 13.3 14.2  Hematocrit 39.0 - 52.0 % 42.6 39.4  41.5  Platelets 150.0 - 400.0 K/uL 160.0 116.0(L) 145.0(L)   Lab Results  Component Value Date   TSH 1.13 02/28/2011   Lab Results  Component Value Date   HGBA1C 8.3* 02/08/2015   Lipid Panel     Component Value Date/Time   CHOL 149 11/09/2014 0824   TRIG 446.0* 11/09/2014 0824   HDL 27.60* 11/09/2014 0824   CHOLHDL 5 11/09/2014 0824   VLDL 116.8* 09/30/2014 0747   LDLCALC 61 05/09/2013 1105   LDLDIRECT 44.0 02/08/2015 0849    RADIOLOGY: No results found.    ASSESSMENT AND PLAN: Mr. Phariss  is  24 years since his initial PTCA to his RCA. His last catheterization was in October 2009 when his fourth stent was placed in his RCA. Presently he is without anginal symptoms or significant shortness of breath. His blood pressure is controlled today on lisinopril 2.5 mg a day,and  atenolol 75 mg.  He remains on  dual antiplatelet therapy with aspirin 81 mg and Plavix 75 mg with his multiple stents.  Last year he was started on lovaza for his hypertriglyceridemia and noted some mild joint discomfort.   His atorvastatin was reduced to 20 mg because of this and her commended coenzyme Q 10 300 mg daily.  His most recent lipid panel earlier this year showed an direct LDL of 44.  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  His most recent hemoglobin A1c was elevated.  He continues to use CPAP with 100% compliance for his obstructive sleep apnea.  He is mildly bradycardic, but asymptomatic on his atenolol.  He continues to have moderate obesity with a body mass index of 36.6, and we discussed additional exercise and weight loss.  As long as he remains stable, I will see him in one year for cardiology reevaluation.  Time spent: 25 minutes Troy Sine, MD, [facility]  02/24/2015 6:16 PM

## 2015-07-17 ENCOUNTER — Other Ambulatory Visit: Payer: Self-pay | Admitting: Family Medicine

## 2015-10-04 ENCOUNTER — Other Ambulatory Visit: Payer: Self-pay | Admitting: Family Medicine

## 2015-10-04 DIAGNOSIS — E669 Obesity, unspecified: Secondary | ICD-10-CM

## 2015-10-04 DIAGNOSIS — E1165 Type 2 diabetes mellitus with hyperglycemia: Secondary | ICD-10-CM

## 2015-10-04 DIAGNOSIS — E118 Type 2 diabetes mellitus with unspecified complications: Principal | ICD-10-CM

## 2015-10-04 DIAGNOSIS — IMO0002 Reserved for concepts with insufficient information to code with codable children: Secondary | ICD-10-CM

## 2015-10-04 DIAGNOSIS — Z125 Encounter for screening for malignant neoplasm of prostate: Secondary | ICD-10-CM

## 2015-10-04 DIAGNOSIS — E785 Hyperlipidemia, unspecified: Secondary | ICD-10-CM

## 2015-10-04 DIAGNOSIS — I10 Essential (primary) hypertension: Secondary | ICD-10-CM

## 2015-10-05 ENCOUNTER — Other Ambulatory Visit (INDEPENDENT_AMBULATORY_CARE_PROVIDER_SITE_OTHER): Payer: PPO

## 2015-10-05 DIAGNOSIS — Z125 Encounter for screening for malignant neoplasm of prostate: Secondary | ICD-10-CM

## 2015-10-05 DIAGNOSIS — E118 Type 2 diabetes mellitus with unspecified complications: Secondary | ICD-10-CM | POA: Diagnosis not present

## 2015-10-05 DIAGNOSIS — E1165 Type 2 diabetes mellitus with hyperglycemia: Secondary | ICD-10-CM

## 2015-10-05 DIAGNOSIS — E785 Hyperlipidemia, unspecified: Secondary | ICD-10-CM

## 2015-10-05 DIAGNOSIS — IMO0002 Reserved for concepts with insufficient information to code with codable children: Secondary | ICD-10-CM

## 2015-10-05 LAB — LIPID PANEL
CHOL/HDL RATIO: 5
CHOLESTEROL: 149 mg/dL (ref 0–200)
HDL: 27.7 mg/dL — AB (ref >39.00)

## 2015-10-05 LAB — BASIC METABOLIC PANEL
BUN: 16 mg/dL (ref 6–23)
CALCIUM: 9.8 mg/dL (ref 8.4–10.5)
CO2: 28 meq/L (ref 19–32)
CREATININE: 1.06 mg/dL (ref 0.40–1.50)
Chloride: 105 mEq/L (ref 96–112)
GFR: 73.38 mL/min (ref >60.00)
Glucose, Bld: 189 mg/dL — ABNORMAL HIGH (ref 70–99)
Potassium: 4.4 mEq/L (ref 3.5–5.1)
SODIUM: 141 meq/L (ref 135–145)

## 2015-10-05 LAB — LDL CHOLESTEROL, DIRECT: Direct LDL: 58 mg/dL

## 2015-10-05 LAB — HEMOGLOBIN A1C: HEMOGLOBIN A1C: 7.7 % — AB (ref 4.6–6.5)

## 2015-10-05 LAB — PSA, MEDICARE: PSA: 0.47 ng/mL (ref 0.10–4.00)

## 2015-10-12 ENCOUNTER — Encounter: Payer: PPO | Admitting: Family Medicine

## 2015-10-14 ENCOUNTER — Other Ambulatory Visit: Payer: Self-pay | Admitting: Family Medicine

## 2015-10-22 ENCOUNTER — Encounter: Payer: Self-pay | Admitting: Family Medicine

## 2015-10-22 ENCOUNTER — Ambulatory Visit (INDEPENDENT_AMBULATORY_CARE_PROVIDER_SITE_OTHER): Payer: PPO | Admitting: Family Medicine

## 2015-10-22 VITALS — BP 130/66 | HR 53 | Temp 97.6°F | Ht 68.0 in | Wt 249.5 lb

## 2015-10-22 DIAGNOSIS — IMO0001 Reserved for inherently not codable concepts without codable children: Secondary | ICD-10-CM

## 2015-10-22 DIAGNOSIS — E118 Type 2 diabetes mellitus with unspecified complications: Secondary | ICD-10-CM

## 2015-10-22 DIAGNOSIS — Z9989 Dependence on other enabling machines and devices: Secondary | ICD-10-CM

## 2015-10-22 DIAGNOSIS — Z0000 Encounter for general adult medical examination without abnormal findings: Secondary | ICD-10-CM

## 2015-10-22 DIAGNOSIS — Z7189 Other specified counseling: Secondary | ICD-10-CM

## 2015-10-22 DIAGNOSIS — E1165 Type 2 diabetes mellitus with hyperglycemia: Secondary | ICD-10-CM

## 2015-10-22 DIAGNOSIS — E785 Hyperlipidemia, unspecified: Secondary | ICD-10-CM

## 2015-10-22 DIAGNOSIS — G4733 Obstructive sleep apnea (adult) (pediatric): Secondary | ICD-10-CM

## 2015-10-22 DIAGNOSIS — I10 Essential (primary) hypertension: Secondary | ICD-10-CM

## 2015-10-22 DIAGNOSIS — IMO0002 Reserved for concepts with insufficient information to code with codable children: Secondary | ICD-10-CM

## 2015-10-22 DIAGNOSIS — F17200 Nicotine dependence, unspecified, uncomplicated: Secondary | ICD-10-CM

## 2015-10-22 MED ORDER — GLIMEPIRIDE 2 MG PO TABS
2.0000 mg | ORAL_TABLET | Freq: Every day | ORAL | Status: DC
Start: 2015-10-22 — End: 2016-10-30

## 2015-10-22 NOTE — Assessment & Plan Note (Signed)
Compliant with CPAP. Last sleep study several years ago.

## 2015-10-22 NOTE — Assessment & Plan Note (Signed)
Improvement over last reading but still uncontrolled (goal <7%). States UTD pneumococcal vaccines. Increase glimepiride to 2mg  with breakfast - discussed taking med with meal. RTC 6 mo f/u visit.

## 2015-10-22 NOTE — Assessment & Plan Note (Signed)
Continue lipitor 20mg , fish oil 2000gm BID. Trig elevated - discussed. Will work on better sugar control and recheck next visit.

## 2015-10-22 NOTE — Progress Notes (Signed)
BP 130/66 mmHg  Pulse 53  Temp(Src) 97.6 F (36.4 C) (Oral)  Ht _0  (1.727 m)  Wt 249 lb 8 oz (113.172 kg)  BMI 37.94 kg/m2  SpO2 95%   CC: medicare wellness visit Subjective:    Patient ID: Evan Avery, male    DOB: 1946-02-01, 69 y.o.   MRN: [id number]  HPI: Evan Avery is a 69 y.o. male presenting on 10/22/2015 for Annual Exam   Sees Dr Francia Greaves as PCP at [facility].   Smoking 1 ppd. Pre-contemplative to contemplative.   On prednisone course for foot pain (started today) - [facility], on plantars fasciitis, foot spur and arthritis. Pending custom orthotics.   OSA on CPAP through PCP ([place]). Last sleep study a few years back (in home study)  Hearing screen - done by [place]. Pending hearing aides. Vision screen - with eye doctor 09/2015 - seen regularly due to glaucoma Fall risk screen - negative Depression screen - negative  Preventative: Colon cancer screening - had stool kit at [place].  Prostate cancer screening - discussed, may screen Q 2-3 yrs. Last done 2015. Will skip 2016.  Lung cancer screening - interested if insurance will cover.  Flu shot - at [place] Tdap 2014 Pneumonia shot - 10/2012 presumed pneumovax. prevnar per patient done at [facility] as well. Shingles shot - declines. Never had chicken pox.  Advanced directive discussion - does not want prolonged life support. Would want wife to be HCPOA. Packet provided today. Seat belt use discussed Sunscreen use discussed, no changing moles on skin.  Lives with wife and son (with mental issues) Occupation: retired, was Multimedia programmer for [employer] Activity: no regular exercise Diet: good water, fruits/vegetables daily  Relevant past medical, surgical, family and social history reviewed and updated as indicated. Interim medical history since our last visit reviewed. Allergies and medications reviewed and updated. Current Outpatient Prescriptions on File Prior to Visit  Medication Sig  . aspirin 81 MG tablet Take  81 mg by mouth daily.    Marland Kitchen atenolol (TENORMIN) 50 MG tablet TAKE ONE & ONE-HALF TABLETS BY MOUTH ONCE DAILY  . atorvastatin (LIPITOR) 40 MG tablet TAKE ONE-HALF TABLET BY MOUTH ONCE DAILY  . brimonidine (ALPHAGAN) 0.2 % ophthalmic solution 1 drop 2 (two) times daily.   . clopidogrel (PLAVIX) 75 MG tablet TAKE ONE TABLET BY MOUTH ONCE DAILY  . Coenzyme Q10 200 MG capsule Take 200 mg by mouth daily.  . Glucosamine-Chondroitin-Vit D3 1500-1200-800 MG-MG-UNIT PACK Take 1 tablet by mouth daily.  Marland Kitchen latanoprost (XALATAN) 0.005 % ophthalmic solution Place 1 drop into both eyes at bedtime.   Marland Kitchen lisinopril (PRINIVIL,ZESTRIL) 2.5 MG tablet Take 1 tablet (2.5 mg total) by mouth daily.  . metFORMIN (GLUCOPHAGE) 1000 MG tablet Take 1 tablet (1,000 mg total) by mouth 2 (two) times daily with a meal.  . Multiple Vitamin (MULTIVITAMIN) tablet Take 1 tablet by mouth daily.  Marland Kitchen omega-3 acid ethyl esters (LOVAZA) 1 G capsule Take 2 capsules (2 g total) by mouth 2 (two) times daily.  . Turmeric 450 MG CAPS Take 1 capsule by mouth daily.   No current facility-administered medications on file prior to visit.    Review of Systems  Constitutional: Negative for fever, chills, activity change, appetite change, fatigue and unexpected weight change.  HENT: Negative for hearing loss.   Eyes: Negative for visual disturbance.  Respiratory: Positive for cough (intermittent smoker's). Negative for chest tightness, shortness of breath and wheezing.   Cardiovascular: Negative  for chest pain, palpitations and leg swelling.  Gastrointestinal: Negative for nausea, vomiting, abdominal pain, diarrhea, constipation, blood in stool and abdominal distention.  Genitourinary: Negative for hematuria and difficulty urinating.  Musculoskeletal: Negative for myalgias, arthralgias and neck pain.  Skin: Negative for rash.  Neurological: Negative for dizziness, seizures, syncope and headaches.  Hematological: Negative for adenopathy.  Bruises/bleeds easily.  Psychiatric/Behavioral: Negative for dysphoric mood. The patient is not nervous/anxious.    Per HPI unless specifically indicated in ROS section     Objective:    BP 130/66 mmHg  Pulse 53  Temp(Src) 97.6 F (36.4 C) (Oral)  Ht _0  (1.727 m)  Wt 249 lb 8 oz (113.172 kg)  BMI 37.94 kg/m2  SpO2 95%  Wt Readings from Last 3 Encounters:  10/22/15 249 lb 8 oz (113.172 kg)  02/23/15 248 lb 3.2 oz (112.583 kg)  02/08/15 247 lb 12 oz (112.379 kg)    Physical Exam  Constitutional: He is oriented to person, place, and time. He appears well-developed and well-nourished. No distress.  HENT:  Head: Normocephalic and atraumatic.  Right Ear: Hearing, tympanic membrane, external ear and ear canal normal.  Left Ear: Hearing, tympanic membrane, external ear and ear canal normal.  Nose: Nose normal.  Mouth/Throat: Uvula is midline, oropharynx is clear and moist and mucous membranes are normal. No oropharyngeal exudate, posterior oropharyngeal edema or posterior oropharyngeal erythema.  Eyes: Conjunctivae and EOM are normal. Pupils are equal, round, and reactive to light. No scleral icterus.  Neck: Normal range of motion. Neck supple. Carotid bruit is not present. No thyromegaly present.  Cardiovascular: Normal rate, regular rhythm, normal heart sounds and intact distal pulses.   No murmur heard. Pulses:      Radial pulses are 2+ on the right side, and 2+ on the left side.  Pulmonary/Chest: Effort normal and breath sounds normal. No respiratory distress. He has no wheezes. He has no rales.  Abdominal: Soft. Bowel sounds are normal. He exhibits no distension and no mass. There is no tenderness. There is no rebound and no guarding.  Genitourinary:  DRE deferred today  Musculoskeletal: Normal range of motion. He exhibits no edema.  Lymphadenopathy:    He has no cervical adenopathy.  Neurological: He is alert and oriented to person, place, and time.  CN grossly intact,  station and gait intact Recall 3/3 Calculation 5/5 serial 3s  Skin: Skin is warm and dry. No rash noted.  Psychiatric: He has a normal mood and affect. His behavior is normal. Judgment and thought content normal.  Nursing note and vitals reviewed.  Results for orders placed or performed in visit on 10/05/15  Lipid panel  Result Value Ref Range   Cholesterol 149 0 - 200 mg/dL   Triglycerides (H) 0.0 - 149.0 mg/dL    413.0 Triglyceride is over 400; calculations on Lipids are invalid.   HDL 27.70 (L) >39.00 mg/dL   Total CHOL/HDL Ratio 5   Hemoglobin A1c  Result Value Ref Range   Hgb A1c MFr Bld 7.7 (H) 4.6 - 6.5 %  Basic metabolic panel  Result Value Ref Range   Sodium 141 135 - 145 mEq/L   Potassium 4.4 3.5 - 5.1 mEq/L   Chloride 105 96 - 112 mEq/L   CO2 28 19 - 32 mEq/L   Glucose, Bld 189 (H) 70 - 99 mg/dL   BUN 16 6 - 23 mg/dL   Creatinine, Ser 1.06 0.40 - 1.50 mg/dL   Calcium 9.8 8.4 - 10.5 mg/dL  GFR 73.38 >60.00 mL/min  PSA, Medicare  Result Value Ref Range   PSA 0.47 0.10 - 4.00 ng/ml  LDL cholesterol, direct  Result Value Ref Range   Direct LDL 58.0 mg/dL      Assessment & Plan:   Problem List Items Addressed This Visit    Smoker    Continue to encourage cessation. Contemplative. Worried about weight gain. Discussed lung cancer screening CT. Interested in this - will refer.       Relevant Orders   Ambulatory Referral for Lung Cancer Scre   OSA on CPAP    Compliant with CPAP. Last sleep study several years ago.      Obesity, Class II, BMI 35-39.9, with comorbidity (HCC)    Discussed healthy diet and lifestyle changes to affect sustainable weight loss. Discussed bariatric medications - in heart history didn't recommend any. Discussed belviq, pt hesitant due to possible cardiac side effects. Discussed xenical, pt hesitant due to GI side effects.      Relevant Medications   glimepiride (AMARYL) 2 MG tablet   Medicare annual wellness visit, subsequent -  Primary    I have personally reviewed the Medicare Annual Wellness questionnaire and have noted 1. The patient's medical and social history 2. Their use of alcohol, tobacco or illicit drugs 3. Their current medications and supplements 4. The patient's functional ability including ADL's, fall risks, home safety risks and hearing or visual impairment. Cognitive function has been assessed and addressed as indicated.  5. Diet and physical activity 6. Evidence for depression or mood disorders The patients weight, height, BMI have been recorded in the chart. I have made referrals, counseling and provided education to the patient based on review of the above and I have provided the pt with a written personalized care plan for preventive services. Provider list updated.. See scanned questionairre as needed for further documentation. Reviewed preventative protocols and updated unless pt declined.       Hyperlipidemia with target LDL less than 70    Continue lipitor 57m, fish oil 2000gm BID. Trig elevated - discussed. Will work on better sugar control and recheck next visit.      Health maintenance examination    Preventative protocols reviewed and updated unless pt declined. Discussed healthy diet and lifestyle.       Essential hypertension    Chronic, stable. Continue current regimen.      Diabetes mellitus type 2, uncontrolled, with complications (HCarroll    Improvement over last reading but still uncontrolled (goal <7%). States UTD pneumococcal vaccines. Increase glimepiride to 279mwith breakfast - discussed taking med with meal. RTC 6 mo f/u visit.      Relevant Medications   glimepiride (AMARYL) 2 MG tablet   Advanced care planning/counseling discussion    Advanced directive discussion - does not want prolonged life support. Would want wife to be HCPOA. Packet provided today.           Follow up plan: Return in about 6 months (around 04/21/2016), or as needed, for follow up  visit.

## 2015-10-22 NOTE — Assessment & Plan Note (Signed)
Preventative protocols reviewed and updated unless pt declined. Discussed healthy diet and lifestyle.  

## 2015-10-22 NOTE — Assessment & Plan Note (Addendum)
Continue to encourage cessation. Contemplative. Worried about weight gain. Discussed lung cancer screening CT. Interested in this - will refer.

## 2015-10-22 NOTE — Assessment & Plan Note (Signed)

## 2015-10-22 NOTE — Assessment & Plan Note (Signed)
Discussed healthy diet and lifestyle changes to affect sustainable weight loss. Discussed bariatric medications - in heart history didn't recommend any. Discussed belviq, pt hesitant due to possible cardiac side effects. Discussed xenical, pt hesitant due to GI side effects.

## 2015-10-22 NOTE — Patient Instructions (Addendum)
Call us with results of stool kit done at [facility].  Advanced directive packet provided today. Pass by [place]'s office to set up appointment with lung cancer screening RN. Increase glimepiride to 2 tablets with first meal.  Return as needed or in 6 months for follow up diabetes visit.  Health Maintenance, Male A healthy lifestyle and preventative care can promote health and wellness.  Maintain regular health, dental, and eye exams.  Eat a healthy diet. Foods like vegetables, fruits, whole grains, low-fat dairy products, and lean protein foods contain the nutrients you need and are low in calories. Decrease your intake of foods high in solid fats, added sugars, and salt. Get information about a proper diet from your health care provider, if necessary.  Regular physical exercise is one of the most important things you can do for your health. Most adults should get at least 150 minutes of moderate-intensity exercise (any activity that increases your heart rate and causes you to sweat) each week. In addition, most adults need muscle-strengthening exercises on 2 or more days a week.   Maintain a healthy weight. The body mass index (BMI) is a screening tool to identify possible weight problems. It provides an estimate of body fat based on height and weight. Your health care provider can find your BMI and can help you achieve or maintain a healthy weight. For males 20 years and older:  A BMI below 18.5 is considered underweight.  A BMI of 18.5 to 24.9 is normal.  A BMI of 25 to 29.9 is considered overweight.  A BMI of 30 and above is considered obese.  Maintain normal blood lipids and cholesterol by exercising and minimizing your intake of saturated fat. Eat a balanced diet with plenty of fruits and vegetables. Blood tests for lipids and cholesterol should begin at age 70 and be repeated every 5 years. If your lipid or cholesterol levels are high, you are over age 38, or you are at high risk for heart  disease, you may need your cholesterol levels checked more frequently.Ongoing high lipid and cholesterol levels should be treated with medicines if diet and exercise are not working.  If you smoke, find out from your health care provider how to quit. If you do not use tobacco, do not start.  Lung cancer screening is recommended for adults aged 28-80 years who are at high risk for developing lung cancer because of a history of smoking. A yearly low-dose CT scan of the lungs is recommended for people who have at least a 30-pack-year history of smoking and are current smokers or have quit within the past 15 years. A pack year of smoking is smoking an average of 1 pack of cigarettes a day for 1 year (for example, a 30-pack-year history of smoking could mean smoking 1 pack a day for 30 years or 2 packs a day for 15 years). Yearly screening should continue until the smoker has stopped smoking for at least 15 years. Yearly screening should be stopped for people who develop a health problem that would prevent them from having lung cancer treatment.  If you choose to drink alcohol, do not have more than 2 drinks per day. One drink is considered to be 12 oz (360 mL) of beer, 5 oz (150 mL) of wine, or 1.5 oz (45 mL) of liquor.  Avoid the use of street drugs. Do not share needles with anyone. Ask for help if you need support or instructions about stopping the use of drugs.  High blood pressure causes heart disease and increases the risk of stroke. High blood pressure is more likely to develop in:  People who have blood pressure in the end of the normal range (100-139/85-89 mm Hg).  People who are overweight or obese.  People who are African American.  If you are 31-71 years of age, have your blood pressure checked every 3-5 years. If you are 35 years of age or older, have your blood pressure checked every year. You should have your blood pressure measured twice--once when you are at a hospital or clinic, and  once when you are not at a hospital or clinic. Record the average of the two measurements. To check your blood pressure when you are not at a hospital or clinic, you can use:  An automated blood pressure machine at a pharmacy.  A home blood pressure monitor.  If you are 36-48 years old, ask your health care provider if you should take aspirin to prevent heart disease.  Diabetes screening involves taking a blood sample to check your fasting blood sugar level. This should be done once every 3 years after age 58 if you are at a normal weight and without risk factors for diabetes. Testing should be considered at a younger age or be carried out more frequently if you are overweight and have at least 1 risk factor for diabetes.  Colorectal cancer can be detected and often prevented. Most routine colorectal cancer screening begins at the age of 49 and continues through age 46. However, your health care provider may recommend screening at an earlier age if you have risk factors for colon cancer. On a yearly basis, your health care provider may provide home test kits to check for hidden blood in the stool. A small camera at the end of a tube may be used to directly examine the colon (sigmoidoscopy or colonoscopy) to detect the earliest forms of colorectal cancer. Talk to your health care provider about this at age 71 when routine screening begins. A direct exam of the colon should be repeated every 5-10 years through age 49, unless early forms of precancerous polyps or small growths are found.  People who are at an increased risk for hepatitis B should be screened for this virus. You are considered at high risk for hepatitis B if:  You were born in a country where hepatitis B occurs often. Talk with your health care provider about which countries are considered high risk.  Your parents were born in a high-risk country and you have not received a shot to protect against hepatitis B (hepatitis B  vaccine).  You have HIV or AIDS.  You use needles to inject street drugs.  You live with, or have sex with, someone who has hepatitis B.  You are a man who has sex with other men (MSM).  You get hemodialysis treatment.  You take certain medicines for conditions like cancer, organ transplantation, and autoimmune conditions.  Hepatitis C blood testing is recommended for all people born from [birth year] through 1966 and any individual with known risk factors for hepatitis C.  Healthy men should no longer receive prostate-specific antigen (PSA) blood tests as part of routine cancer screening. Talk to your health care provider about prostate cancer screening.  Testicular cancer screening is not recommended for adolescents or adult males who have no symptoms. Screening includes self-exam, a health care provider exam, and other screening tests. Consult with your health care provider about any symptoms you have or any  concerns you have about testicular cancer.  Practice safe sex. Use condoms and avoid high-risk sexual practices to reduce the spread of sexually transmitted infections (STIs).  You should be screened for STIs, including gonorrhea and chlamydia if:  You are sexually active and are younger than 24 years.  You are older than 24 years, and your health care provider tells you that you are at risk for this type of infection.  Your sexual activity has changed since you were last screened, and you are at an increased risk for chlamydia or gonorrhea. Ask your health care provider if you are at risk.  If you are at risk of being infected with HIV, it is recommended that you take a prescription medicine daily to prevent HIV infection. This is called pre-exposure prophylaxis (PrEP). You are considered at risk if:  You are a man who has sex with other men (MSM).  You are a heterosexual man who is sexually active with multiple partners.  You take drugs by injection.  You are sexually active  with a partner who has HIV.  Talk with your health care provider about whether you are at high risk of being infected with HIV. If you choose to begin PrEP, you should first be tested for HIV. You should then be tested every 3 months for as long as you are taking PrEP.  Use sunscreen. Apply sunscreen liberally and repeatedly throughout the day. You should seek shade when your shadow is shorter than you. Protect yourself by wearing long sleeves, pants, a wide-brimmed hat, and sunglasses year round whenever you are outdoors.  Tell your health care provider of new moles or changes in moles, especially if there is a change in shape or color. Also, tell your health care provider if a mole is larger than the size of a pencil eraser.  A one-time screening for abdominal aortic aneurysm (AAA) and surgical repair of large AAAs by ultrasound is recommended for men aged 18-75 years who are current or former smokers.  Stay current with your vaccines (immunizations).   This information is not intended to replace advice given to you by your health care provider. Make sure you discuss any questions you have with your health care provider.   Document Released: 04/06/2008 Document Revised: 10/30/2014 Document Reviewed: 03/06/2011 Elsevier Interactive Patient Education [employer].

## 2015-10-22 NOTE — Assessment & Plan Note (Signed)
Chronic, stable. Continue current regimen. 

## 2015-10-22 NOTE — Progress Notes (Signed)
Pre visit review using our clinic review tool, if applicable. No additional management support is needed unless otherwise documented below in the visit note. 

## 2015-10-22 NOTE — Assessment & Plan Note (Signed)
Advanced directive discussion - does not want prolonged life support. Would want wife to be HCPOA. Packet provided today. 

## 2015-11-10 DIAGNOSIS — G4733 Obstructive sleep apnea (adult) (pediatric): Secondary | ICD-10-CM | POA: Diagnosis not present

## 2015-11-10 DIAGNOSIS — M170 Bilateral primary osteoarthritis of knee: Secondary | ICD-10-CM | POA: Diagnosis not present

## 2015-11-14 ENCOUNTER — Other Ambulatory Visit: Payer: Self-pay | Admitting: Family Medicine

## 2015-11-22 ENCOUNTER — Encounter: Payer: Self-pay | Admitting: Family Medicine

## 2016-01-19 DIAGNOSIS — M170 Bilateral primary osteoarthritis of knee: Secondary | ICD-10-CM | POA: Diagnosis not present

## 2016-01-29 ENCOUNTER — Other Ambulatory Visit: Payer: Self-pay | Admitting: Family Medicine

## 2016-03-13 ENCOUNTER — Encounter: Payer: Self-pay | Admitting: Cardiovascular Disease

## 2016-03-13 ENCOUNTER — Ambulatory Visit (INDEPENDENT_AMBULATORY_CARE_PROVIDER_SITE_OTHER): Payer: PPO | Admitting: Cardiovascular Disease

## 2016-03-13 VITALS — BP 144/76 | HR 52 | Ht 68.0 in | Wt 247.4 lb

## 2016-03-13 DIAGNOSIS — IMO0002 Reserved for concepts with insufficient information to code with codable children: Secondary | ICD-10-CM

## 2016-03-13 DIAGNOSIS — E109 Type 1 diabetes mellitus without complications: Secondary | ICD-10-CM

## 2016-03-13 DIAGNOSIS — I2510 Atherosclerotic heart disease of native coronary artery without angina pectoris: Secondary | ICD-10-CM | POA: Diagnosis not present

## 2016-03-13 DIAGNOSIS — Z79899 Other long term (current) drug therapy: Secondary | ICD-10-CM | POA: Diagnosis not present

## 2016-03-13 DIAGNOSIS — Z9989 Dependence on other enabling machines and devices: Secondary | ICD-10-CM

## 2016-03-13 DIAGNOSIS — IMO0001 Reserved for inherently not codable concepts without codable children: Secondary | ICD-10-CM

## 2016-03-13 DIAGNOSIS — I10 Essential (primary) hypertension: Secondary | ICD-10-CM

## 2016-03-13 DIAGNOSIS — E1165 Type 2 diabetes mellitus with hyperglycemia: Secondary | ICD-10-CM

## 2016-03-13 DIAGNOSIS — E785 Hyperlipidemia, unspecified: Secondary | ICD-10-CM

## 2016-03-13 DIAGNOSIS — E118 Type 2 diabetes mellitus with unspecified complications: Secondary | ICD-10-CM

## 2016-03-13 DIAGNOSIS — G4733 Obstructive sleep apnea (adult) (pediatric): Secondary | ICD-10-CM

## 2016-03-13 NOTE — Patient Instructions (Addendum)
Your physician wants you to follow-up in: 1 year or sooner if needed.You will receive a reminder letter in the mail two months in advance. If you don't receive a letter, please call our office to schedule the follow-up appointment.   Your physician recommends that you return for lab work.  If you need a refill on your cardiac medications before your next appointment, please call your pharmacy.

## 2016-03-13 NOTE — Progress Notes (Signed)
Patient ID: Evan Avery, male   DOB: [birth year], 70 y.o.   MRN: [id number]   Primary M.D.: Dr. Danise Avery  HPI: Evan Avery is a 70 y.o. male who presents today for a 35 month cardiology evaluation.  Evan Avery has known CAD and has a total of 4 stents in his RCA. In 1992, he underwent initial PTCA of his RCA. Subsequently, he underwent stenting of the proximal and mid RCA.  In 1999 his fourth stent was placed in the mid RCA between the 2 previously placed stents and was a 3.5x28 mm non-DES Liberty stent. His last nuclear perfusion study in August 2012 showed inferior thinning without significant ischemia.  EF was not calculated secondary to  ectopy but previously had been 52%.  Additional problems include hypertension, hyperlipidemia, obesity, and obstructive sleep apnea for which he is he uses CPAP 100% of the time and uses  [employer] for his [employer].  Since I saw him one year ago, he denies any episodes of chest pain or shortness of breath.  He has a new CPAP machine and continues with 100% compliance.  He has been having some difficulty with knee and feet discomfort.  He had been told of having plantar fasciitis as well as spurs.  He has established primary care with Dr. Danise Avery.  Review of laboratory that he had with him in December 2016 has shown a cholesterol of 149, HDL 27, but his triglycerides were 413.  Remotely he had triglyceride elevations as high as 584.  In December, his hemoglobin A1c was 7.7.  He presents for evaluation.  Past Medical History  Diagnosis Date  . Hypertension   . Diabetes mellitus   . Hyperlipidemia   . CAD (coronary artery disease)     4 stents in right artery  . Myocardial infarction ([place])     1991  . Sleep apnea     uses CPAP regularly  . Complex tear of medial meniscus of left knee as current injury 07/26/2012    [place]  . Glaucoma 2006    on drops, followed by [facility]    Past Surgical History  Procedure Laterality Date  .  Percutaneous coronary stent intervention (pci-s)  1992, 1999    4 vessels [name])  . Appendectomy      at age 33  . Doppler echocardiography  08/20/2008    EF >76%; LV systolic function normal; LV mildly dilated; doppler flow suggestive of impaired LV relaxation; RV mildly dilated, RV systolic function normal, RV systolic pressure normal  . Cardiovascular stress test  06/16/2011    R/P MV - moderate perfusion defect in basal inferoseptal, basal inferior, mid inferseptal and mid inferior regions, consistent with infarct/scar and/or diaphragmatic attenuation; non-gated study secondary to ectopy; no CP or EKG changes for ischemia; abnormal study although no significant changes from previous study; in the absense of gated images cannot calculate EF or distinguish scar/artifact  . Cardiac catheterization  08/11/2008    angiographically patent LAD diagonal, circumflex and multiple circumflex obtuse marginal branches from the L coronary artery; stenting of mid R coronary artery with 3.5 x 84m Liberte non DES postdilated w/ 4.0 DuraStent ([name]    Allergies  Allergen Reactions  . Niacin And Related Other (See Comments)    Extreme fatigue    Current Outpatient Prescriptions  Medication Sig Dispense Refill  . aspirin 81 MG tablet Take 81 mg by mouth daily.      .Marland Kitchenatenolol (TENORMIN) 50 MG tablet TAKE ONE &  ONE-HALF TABLETS BY MOUTH ONCE DAILY 135 tablet 1  . atorvastatin (LIPITOR) 40 MG tablet TAKE ONE-HALF TABLET BY MOUTH ONCE DAILY 45 tablet 1  . brimonidine (ALPHAGAN) 0.2 % ophthalmic solution 1 drop 2 (two) times daily.     . clopidogrel (PLAVIX) 75 MG tablet TAKE ONE TABLET BY MOUTH ONCE DAILY 90 tablet 1  . Coenzyme Q10 200 MG capsule Take 200 mg by mouth daily.    Marland Kitchen glimepiride (AMARYL) 2 MG tablet Take 1 tablet (2 mg total) by mouth daily with breakfast. 30 tablet 11  . Glucosamine-Chondroitin-Vit D3 1500-1200-800 MG-MG-UNIT PACK Take 1 tablet by mouth daily.    Marland Kitchen latanoprost (XALATAN)  0.005 % ophthalmic solution Place 1 drop into both eyes at bedtime.     Marland Kitchen lisinopril (PRINIVIL,ZESTRIL) 2.5 MG tablet Take 1 tablet (2.5 mg total) by mouth daily. 90 tablet 2  . metFORMIN (GLUCOPHAGE) 1000 MG tablet TAKE ONE TABLET BY MOUTH TWICE DAILY WITH MEALS 180 tablet 0  . Multiple Vitamin (MULTIVITAMIN) tablet Take 1 tablet by mouth daily.    Marland Kitchen omega-3 acid ethyl esters (LOVAZA) 1 g capsule TAKE TWO CAPSULES BY MOUTH TWICE DAILY 360 capsule 3  . Turmeric 450 MG CAPS Take 1 capsule by mouth daily.     No current facility-administered medications for this visit.    Socially he is married has 2 children 2 grandchildren. He does walk several times a week. He does have a tobacco history. There is no alcohol use.   ROS General: Negative; No fevers, chills, or night sweats; positive for obesity. HEENT: Negative; No changes in vision or hearing, sinus congestion, difficulty swallowing Pulmonary: Negative; No cough, wheezing, shortness of breath, hemoptysis Cardiovascular: Negative; No chest pain, presyncope, syncope, palpatations GI: Negative; No nausea, vomiting, diarrhea, or abdominal pain GU: Negative; No dysuria, hematuria, or difficulty voiding Musculoskeletal: He is status post left knee surgery; no myalgias, joint pain, or weakness Hematologic/Oncology: Negative; no easy bruising, bleeding Endocrine: Negative; no heat/cold intolerance; no diabetes Neuro: Negative; no changes in balance, headaches Skin: Negative; No rashes or skin lesions Psychiatric: Negative; No behavioral problems, depression Sleep: Positive for sleep apnea, on CPAP with 100% compliance. No snoring, daytime sleepiness, hypersomnolence, bruxism, restless legs, hypnogognic hallucinations, no cataplexy Other comprehensive 14 point system review is negative.   PE BP 144/76 mmHg  Pulse 52  Ht 5' 8"  (1.727 m)  Wt 247 lb 6.4 oz (112.22 kg)  BMI 37.63 kg/m2   Wt Readings from Last 3 Encounters:  03/13/16 247 lb  6.4 oz (112.22 kg)  10/22/15 249 lb 8 oz (113.172 kg)  02/23/15 248 lb 3.2 oz (112.583 kg)   General: Alert, oriented, no distress.  Skin: normal turgor, no rashes HEENT: Normocephalic, atraumatic. Pupils round and reactive; sclera anicteric;no lid lag,  Nose without nasal septal hypertrophy Mouth/Parynx benign; Mallinpatti scale 3/4 Neck: Thick neck; No JVD, no carotid bruits with normal carotid upstroke Chest: No tenderness to palpation  Lungs: clear to ausculatation and percussion; no wheezing or rales Heart: RRR, s1 s2 normal 1/6 systolic murmur; no diastolic murmur.  No S3 or S4 gallop.  No rubs, thrills or heaves.  Abdomen: Moderate central adiposity; soft, nontender; no hepatosplenomehaly, BS+; abdominal aorta nontender and not dilated by palpation. Back: No CVA tenderness  Pulses 2+ Extremities: no clubbing cyanosis or edema, Homan's sign negative.  Neurologic: grossly nonfocal; cranial nerves intact  Psychologic: Normal affect and mood  ECG (independently read by me): Sinus bradycardia 52 bpm.  T-wave abnormality in  leads 3 and aVF.  May 2016 ECG (independently read by me): Sinus bradycardia 53 bpm.  Nondiagnostic T changes with T-wave inversion in lead 3.  June 2015 ECG (independently read by me):  sinus bradycardia at 55 QTc interval 447 ms.  PR interval 174 ms.  Nondiagnostic T changes in lead 3.  ECG: Sinus rhythm at 56 beats per minute. No ectopy. Normal intervals; nonspecific T changes in lead 3.  LABS: BMP Latest Ref Rng 10/05/2015 02/08/2015 09/30/2014  Glucose 70 - 99 mg/dL 189(H) 250(H) 191(H)  BUN 6 - 23 mg/dL 16 13 22   Creatinine 0.40 - 1.50 mg/dL 1.06 0.96 1.1  Sodium 135 - 145 mEq/L 141 137 136  Potassium 3.5 - 5.1 mEq/L 4.4 4.2 4.5  Chloride 96 - 112 mEq/L 105 104 102  CO2 19 - 32 mEq/L 28 25 25   Calcium 8.4 - 10.5 mg/dL 9.8 9.9 9.4   Hepatic Function Latest Ref Rng 09/17/2013 05/09/2013 10/18/2011  Total Protein 6.0 - 8.3 g/dL 7.3 7.0 6.9  Albumin 3.5 -  5.2 g/dL 4.2 4.2 3.8  AST 0 - 37 U/L 20 19 22   ALT 0 - 53 U/L 28 25 31   Alk Phosphatase 39 - 117 U/L 58 70 70  Total Bilirubin 0.3 - 1.2 mg/dL 0.7 0.6 0.3  Bilirubin, Direct 0.0 - 0.3 mg/dL - 0.0 -    CBC Latest Ref Rng 09/30/2014 05/29/2013 05/09/2013  WBC 4.0 - 10.5 K/uL 9.4 11.3(H) 7.6  Hemoglobin 13.0 - 17.0 g/dL 14.8 13.3 14.2  Hematocrit 39.0 - 52.0 % 42.6 39.4 41.5  Platelets 150.0 - 400.0 K/uL 160.0 116.0(L) 145.0(L)   Lab Results  Component Value Date   TSH 1.13 02/28/2011   Lab Results  Component Value Date   HGBA1C 7.7* 10/05/2015   Lipid Panel     Component Value Date/Time   CHOL 149 10/05/2015 0813   TRIG * 10/05/2015 0813    413.0 Triglyceride is over 400; calculations on Lipids are invalid.   HDL 27.70* 10/05/2015 0813   CHOLHDL 5 10/05/2015 0813   VLDL 116.8* 09/30/2014 0747   LDLCALC 61 05/09/2013 1105   LDLDIRECT 58.0 10/05/2015 0813    RADIOLOGY: No results found.   ASSESSMENT AND PLAN: Mr. Bostwick  is a 69 year old Caucasian male who is 25 years since his initial PTCA to his RCA. His last catheterization was in October 2009 when his fourth stent was placed in his RCA. Presently he is without anginal symptoms or significant shortness of breath. His blood pressure is controlled today on lisinopril 2.5 mg a day,and  atenolol 75 mg.  He remains on  dual antiplatelet therapy with aspirin 81 mg and Plavix 75 mg with his multiple stents.  He denies any bleeding.  His recent lipid study confirms persistent hypertriglyceridemia at 413 with a low HDL level of 27.7 and total cholesterol 149.  His direct LDL was 58.  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  I have suggested that he undergo an NMR profile for further evaluation of LDL particle number.If triglycerides continue to be persistently elevated.  Additional therapy will be necessary such as fenofibrate or trilipix.Marland Kitchen  His most recent hemoglobin A1c continues to  be elevated.  He continues to use CPAP with 100% compliance for his obstructive sleep apnea.  He is mildly bradycardic, but asymptomatic on his atenolol.  He has not had any weight loss. His  BMI is consistent with moderate obesity.  He has  had issues with knee and feet discomfort which has been limiting his exercise.  Repeat blood work will be obtained and I will contact him with results.  I will see him in one year for cardiology evaluation or sooner if problems arise.  Time spent: 25 minutes  Troy Sine, MD, [facility]  03/13/2016 1:34 PM

## 2016-03-14 LAB — COMPREHENSIVE METABOLIC PANEL
ALBUMIN: 4.1 g/dL (ref 3.6–5.1)
ALT: 24 U/L (ref 9–46)
AST: 17 U/L (ref 10–35)
Alkaline Phosphatase: 58 U/L (ref 40–115)
BUN: 17 mg/dL (ref 7–25)
CHLORIDE: 106 mmol/L (ref 98–110)
CO2: 24 mmol/L (ref 20–31)
CREATININE: 0.96 mg/dL (ref 0.70–1.18)
Calcium: 9.5 mg/dL (ref 8.6–10.3)
Glucose, Bld: 191 mg/dL — ABNORMAL HIGH (ref 65–99)
Potassium: 4.1 mmol/L (ref 3.5–5.3)
SODIUM: 139 mmol/L (ref 135–146)
Total Bilirubin: 0.4 mg/dL (ref 0.2–1.2)
Total Protein: 6.3 g/dL (ref 6.1–8.1)

## 2016-03-14 LAB — CBC
HEMATOCRIT: 41.7 % (ref 38.5–50.0)
Hemoglobin: 13.9 g/dL (ref 13.2–17.1)
MCH: 32 pg (ref 27.0–33.0)
MCHC: 33.3 g/dL (ref 32.0–36.0)
MCV: 95.9 fL (ref 80.0–100.0)
MPV: 10.8 fL (ref 7.5–12.5)
PLATELETS: 147 10*3/uL (ref 140–400)
RBC: 4.35 MIL/uL (ref 4.20–5.80)
RDW: 13.1 % (ref 11.0–15.0)
WBC: 6 10*3/uL (ref 3.8–10.8)

## 2016-03-14 LAB — HEMOGLOBIN A1C
Hgb A1c MFr Bld: 7.5 % — ABNORMAL HIGH (ref <5.7)
MEAN PLASMA GLUCOSE: 169 mg/dL

## 2016-03-14 LAB — TSH: TSH: 1.11 mIU/L (ref 0.40–4.50)

## 2016-03-19 LAB — CARDIO IQ ADV LIPID AND INFLAMM PNL
APOLIPOPROTEIN (CARDIO IQ ADV LIPID PANEL): 82 mg/dL (ref 52–109)
Cholesterol, Total: 149 mg/dL (ref 125–200)
Cholesterol/HDL Ratio: 5.7 calc — ABNORMAL HIGH (ref <=5.0)
HDL CHOLESTEROL (CARDIO IQ ADV LIPID PANEL): 26 mg/dL — AB (ref >=40)
HS-CRP: 2 mg/L
LDL Large: 4403 nmol/L (ref 4334–10815)
LDL Medium: 130 nmol/L — ABNORMAL LOW (ref 167–465)
LDL PARTICLE NUMBER: 1064 nmol/L (ref 1016–2185)
LDL PEAK SIZE: 206.8 Angstrom — AB (ref >=218.2)
LDL Small: 204 nmol/L (ref 123–441)
LDL, Calculated: 46 mg/dL
Lipoprotein (a): 33 nmol/L (ref <75)
Lp-PLA2 Activity: 99 nmol/min/mL (ref 70–153)
NON-HDL CHOLESTEROL (CARDIO IQ ADV LIPID PANEL): 123 mg/dL
TRIGLYCERIDES (CARDIO IQ ADV LIPID PANEL): 387 mg/dL — AB

## 2016-03-29 ENCOUNTER — Telehealth: Payer: Self-pay | Admitting: Cardiovascular Disease

## 2016-03-29 MED ORDER — FENOFIBRATE 160 MG PO TABS: 160.0000 mg | ORAL_TABLET | Freq: Every day | ORAL | Status: DC

## 2016-03-29 NOTE — Telephone Encounter (Signed)
New message       The pt wants to go over the lab results from the other day the pt states either reach him on the home number [phone] or on the cell phone provided.

## 2016-03-29 NOTE — Telephone Encounter (Signed)
Spoke with pt, aware of lab results. Copy mailed to pt and copy faxed to PCP. New script sent to the pharmacy

## 2016-04-21 ENCOUNTER — Ambulatory Visit: Payer: PPO | Admitting: Family Medicine

## 2016-05-02 ENCOUNTER — Other Ambulatory Visit: Payer: Self-pay | Admitting: Family Medicine

## 2016-05-16 ENCOUNTER — Inpatient Hospital Stay (HOSPITAL_COMMUNITY)
Admission: EM | Admit: 2016-05-16 | Discharge: 2016-06-02 | DRG: 854 | Disposition: A | Discharge status: Home Health | Payer: PPO | Attending: Internal Medicine | Admitting: Internal Medicine

## 2016-05-16 ENCOUNTER — Ambulatory Visit: Payer: PPO | Admitting: Family Medicine

## 2016-05-16 ENCOUNTER — Encounter (HOSPITAL_COMMUNITY): Payer: Self-pay

## 2016-05-16 ENCOUNTER — Emergency Department (HOSPITAL_COMMUNITY): Payer: PPO

## 2016-05-16 ENCOUNTER — Telehealth: Payer: Self-pay | Admitting: Family Medicine

## 2016-05-16 DIAGNOSIS — I25110 Atherosclerotic heart disease of native coronary artery with unstable angina pectoris: Secondary | ICD-10-CM | POA: Diagnosis present

## 2016-05-16 DIAGNOSIS — A419 Sepsis, unspecified organism: Secondary | ICD-10-CM | POA: Diagnosis not present

## 2016-05-16 DIAGNOSIS — Z955 Presence of coronary angioplasty implant and graft: Secondary | ICD-10-CM

## 2016-05-16 DIAGNOSIS — K567 Ileus, unspecified: Secondary | ICD-10-CM | POA: Diagnosis not present

## 2016-05-16 DIAGNOSIS — Z6836 Body mass index (BMI) 36.0-36.9, adult: Secondary | ICD-10-CM

## 2016-05-16 DIAGNOSIS — K631 Perforation of intestine (nontraumatic): Secondary | ICD-10-CM

## 2016-05-16 DIAGNOSIS — E785 Hyperlipidemia, unspecified: Secondary | ICD-10-CM | POA: Diagnosis present

## 2016-05-16 DIAGNOSIS — K8020 Calculus of gallbladder without cholecystitis without obstruction: Secondary | ICD-10-CM | POA: Diagnosis present

## 2016-05-16 DIAGNOSIS — K5720 Diverticulitis of large intestine with perforation and abscess without bleeding: Secondary | ICD-10-CM | POA: Diagnosis not present

## 2016-05-16 DIAGNOSIS — Z833 Family history of diabetes mellitus: Secondary | ICD-10-CM

## 2016-05-16 DIAGNOSIS — I252 Old myocardial infarction: Secondary | ICD-10-CM | POA: Diagnosis not present

## 2016-05-16 DIAGNOSIS — R188 Other ascites: Secondary | ICD-10-CM | POA: Diagnosis not present

## 2016-05-16 DIAGNOSIS — K5900 Constipation, unspecified: Secondary | ICD-10-CM | POA: Diagnosis not present

## 2016-05-16 DIAGNOSIS — K651 Peritoneal abscess: Secondary | ICD-10-CM | POA: Diagnosis not present

## 2016-05-16 DIAGNOSIS — E669 Obesity, unspecified: Secondary | ICD-10-CM | POA: Diagnosis present

## 2016-05-16 DIAGNOSIS — Z6835 Body mass index (BMI) 35.0-35.9, adult: Secondary | ICD-10-CM

## 2016-05-16 DIAGNOSIS — E1165 Type 2 diabetes mellitus with hyperglycemia: Secondary | ICD-10-CM | POA: Diagnosis present

## 2016-05-16 DIAGNOSIS — E118 Type 2 diabetes mellitus with unspecified complications: Secondary | ICD-10-CM | POA: Diagnosis not present

## 2016-05-16 DIAGNOSIS — Z9989 Dependence on other enabling machines and devices: Secondary | ICD-10-CM

## 2016-05-16 DIAGNOSIS — Z7902 Long term (current) use of antithrombotics/antiplatelets: Secondary | ICD-10-CM | POA: Diagnosis not present

## 2016-05-16 DIAGNOSIS — L0291 Cutaneous abscess, unspecified: Secondary | ICD-10-CM

## 2016-05-16 DIAGNOSIS — N390 Urinary tract infection, site not specified: Secondary | ICD-10-CM

## 2016-05-16 DIAGNOSIS — E1169 Type 2 diabetes mellitus with other specified complication: Secondary | ICD-10-CM | POA: Diagnosis present

## 2016-05-16 DIAGNOSIS — F17210 Nicotine dependence, cigarettes, uncomplicated: Secondary | ICD-10-CM | POA: Diagnosis present

## 2016-05-16 DIAGNOSIS — E46 Unspecified protein-calorie malnutrition: Secondary | ICD-10-CM | POA: Diagnosis present

## 2016-05-16 DIAGNOSIS — I10 Essential (primary) hypertension: Secondary | ICD-10-CM | POA: Diagnosis present

## 2016-05-16 DIAGNOSIS — K913 Postprocedural intestinal obstruction: Secondary | ICD-10-CM | POA: Diagnosis not present

## 2016-05-16 DIAGNOSIS — E8770 Fluid overload, unspecified: Secondary | ICD-10-CM | POA: Diagnosis not present

## 2016-05-16 DIAGNOSIS — Z79899 Other long term (current) drug therapy: Secondary | ICD-10-CM

## 2016-05-16 DIAGNOSIS — Z9049 Acquired absence of other specified parts of digestive tract: Secondary | ICD-10-CM | POA: Diagnosis not present

## 2016-05-16 DIAGNOSIS — H409 Unspecified glaucoma: Secondary | ICD-10-CM | POA: Diagnosis not present

## 2016-05-16 DIAGNOSIS — I2510 Atherosclerotic heart disease of native coronary artery without angina pectoris: Secondary | ICD-10-CM | POA: Diagnosis present

## 2016-05-16 DIAGNOSIS — Z7982 Long term (current) use of aspirin: Secondary | ICD-10-CM

## 2016-05-16 DIAGNOSIS — R109 Unspecified abdominal pain: Secondary | ICD-10-CM | POA: Diagnosis not present

## 2016-05-16 DIAGNOSIS — Z8249 Family history of ischemic heart disease and other diseases of the circulatory system: Secondary | ICD-10-CM

## 2016-05-16 DIAGNOSIS — K5732 Diverticulitis of large intestine without perforation or abscess without bleeding: Secondary | ICD-10-CM | POA: Diagnosis not present

## 2016-05-16 DIAGNOSIS — IMO0002 Reserved for concepts with insufficient information to code with codable children: Secondary | ICD-10-CM

## 2016-05-16 DIAGNOSIS — G4733 Obstructive sleep apnea (adult) (pediatric): Secondary | ICD-10-CM

## 2016-05-16 DIAGNOSIS — Z794 Long term (current) use of insulin: Secondary | ICD-10-CM

## 2016-05-16 HISTORY — DX: Peritoneal abscess: K65.1

## 2016-05-16 LAB — URINALYSIS, ROUTINE W REFLEX MICROSCOPIC
HGB URINE DIPSTICK: NEGATIVE
KETONES UR: 15 mg/dL — AB
Nitrite: POSITIVE — AB
PH: 5.5 (ref 5.0–8.0)
Protein, ur: 30 mg/dL — AB
Specific Gravity, Urine: 1.034 — ABNORMAL HIGH (ref 1.005–1.030)

## 2016-05-16 LAB — COMPREHENSIVE METABOLIC PANEL
ALK PHOS: 58 U/L (ref 38–126)
ALT: 27 U/L (ref 17–63)
ANION GAP: 13 (ref 5–15)
AST: 20 U/L (ref 15–41)
Albumin: 4 g/dL (ref 3.5–5.0)
BILIRUBIN TOTAL: 1.3 mg/dL — AB (ref 0.3–1.2)
BUN: 24 mg/dL — AB (ref 6–20)
CO2: 19 mmol/L — ABNORMAL LOW (ref 22–32)
CREATININE: 1.21 mg/dL (ref 0.61–1.24)
Calcium: 9.4 mg/dL (ref 8.9–10.3)
Chloride: 101 mmol/L (ref 101–111)
GFR, EST NON AFRICAN AMERICAN: 59 mL/min — AB (ref >60)
Glucose, Bld: 246 mg/dL — ABNORMAL HIGH (ref 65–99)
Potassium: 4.3 mmol/L (ref 3.5–5.1)
Sodium: 133 mmol/L — ABNORMAL LOW (ref 135–145)
Total Protein: 8 g/dL (ref 6.5–8.1)

## 2016-05-16 LAB — CBC
HEMATOCRIT: 39.2 % (ref 39.0–52.0)
Hemoglobin: 13.4 g/dL (ref 13.0–17.0)
MCH: 32 pg (ref 26.0–34.0)
MCHC: 34.2 g/dL (ref 30.0–36.0)
MCV: 93.6 fL (ref 78.0–100.0)
Platelets: 220 10*3/uL (ref 150–400)
RBC: 4.19 MIL/uL — ABNORMAL LOW (ref 4.22–5.81)
RDW: 13.3 % (ref 11.5–15.5)
WBC: 5.1 10*3/uL (ref 4.0–10.5)

## 2016-05-16 LAB — URINE MICROSCOPIC-ADD ON: RBC / HPF: NONE SEEN RBC/hpf (ref 0–5)

## 2016-05-16 LAB — PROTIME-INR
INR: 1.33 (ref 0.00–1.49)
Prothrombin Time: 16.1 seconds — ABNORMAL HIGH (ref 11.6–15.2)

## 2016-05-16 LAB — TSH: TSH: 1.113 u[IU]/mL (ref 0.350–4.500)

## 2016-05-16 LAB — LACTIC ACID, PLASMA
Lactic Acid, Venous: 2.3 mmol/L (ref 0.5–1.9)
Lactic Acid, Venous: 1.3 mmol/L (ref 0.5–1.9)
Lactic Acid, Venous: 1.6 mmol/L (ref 0.5–1.9)

## 2016-05-16 LAB — PROCALCITONIN: PROCALCITONIN: 16.23 ng/mL

## 2016-05-16 LAB — LIPASE, BLOOD: Lipase: 11 U/L (ref 11–51)

## 2016-05-16 LAB — GLUCOSE, CAPILLARY
GLUCOSE-CAPILLARY: 238 mg/dL — AB (ref 65–99)
GLUCOSE-CAPILLARY: 180 mg/dL — AB (ref 65–99)

## 2016-05-16 LAB — APTT: APTT: 26 s (ref 24–37)

## 2016-05-16 MED ORDER — MORPHINE SULFATE (PF) 2 MG/ML IV SOLN
1.0000 mg | INTRAVENOUS | Status: DC | PRN
  Administered 2016-05-16 – 2016-05-17 (×3): 1 mg via INTRAVENOUS
  Filled 2016-05-16 (×4): qty 1

## 2016-05-16 MED ORDER — PIPERACILLIN-TAZOBACTAM 4.5 G IVPB
4.5000 g | Freq: Once | INTRAVENOUS | Status: DC
  Filled 2016-05-16: qty 100

## 2016-05-16 MED ORDER — SODIUM CHLORIDE 0.9 % IV SOLN
INTRAVENOUS | Status: DC
  Administered 2016-05-16: 13:00:00 via INTRAVENOUS

## 2016-05-16 MED ORDER — ONDANSETRON HCL 4 MG/2ML IJ SOLN: 4.0000 mg | Freq: Four times a day (QID) | INTRAMUSCULAR | Status: DC | PRN

## 2016-05-16 MED ORDER — PIPERACILLIN-TAZOBACTAM 3.375 G IVPB
3.3750 g | Freq: Three times a day (TID) | INTRAVENOUS | Status: DC
  Administered 2016-05-16 – 2016-05-23 (×20): 3.375 g via INTRAVENOUS
  Filled 2016-05-16 (×23): qty 50

## 2016-05-16 MED ORDER — SODIUM CHLORIDE 0.9 % IV BOLUS (SEPSIS)
1000.0000 mL | Freq: Once | INTRAVENOUS | Status: AC
  Administered 2016-05-16: 1000 mL via INTRAVENOUS

## 2016-05-16 MED ORDER — MORPHINE SULFATE (PF) 4 MG/ML IV SOLN
4.0000 mg | INTRAVENOUS | Status: AC | PRN
  Administered 2016-05-16 (×2): 4 mg via INTRAVENOUS
  Filled 2016-05-16 (×2): qty 1

## 2016-05-16 MED ORDER — INSULIN ASPART 100 UNIT/ML ~~LOC~~ SOLN
0.0000 [IU] | Freq: Three times a day (TID) | SUBCUTANEOUS | Status: DC
  Administered 2016-05-16: 3 [IU] via SUBCUTANEOUS
  Administered 2016-05-17: 1 [IU] via SUBCUTANEOUS
  Administered 2016-05-17: 3 [IU] via SUBCUTANEOUS
  Administered 2016-05-17: 1 [IU] via SUBCUTANEOUS
  Administered 2016-05-18 – 2016-05-19 (×4): 2 [IU] via SUBCUTANEOUS
  Administered 2016-05-19: 1 [IU] via SUBCUTANEOUS
  Administered 2016-05-19: 5 [IU] via SUBCUTANEOUS
  Administered 2016-05-20: 3 [IU] via SUBCUTANEOUS
  Administered 2016-05-20 (×2): 2 [IU] via SUBCUTANEOUS
  Administered 2016-05-21: 1 [IU] via SUBCUTANEOUS
  Administered 2016-05-21 – 2016-05-22 (×3): 2 [IU] via SUBCUTANEOUS
  Administered 2016-05-22: 1 [IU] via SUBCUTANEOUS
  Administered 2016-05-22: 2 [IU] via SUBCUTANEOUS

## 2016-05-16 MED ORDER — PIPERACILLIN-TAZOBACTAM 3.375 G IVPB 30 MIN
3.3750 g | Freq: Once | INTRAVENOUS | Status: AC
  Administered 2016-05-16: 3.375 g via INTRAVENOUS
  Filled 2016-05-16: qty 50

## 2016-05-16 MED ORDER — SODIUM CHLORIDE 0.9 % IV BOLUS (SEPSIS)
500.0000 mL | Freq: Once | INTRAVENOUS | Status: AC
Start: 2016-05-16 — End: 2016-05-16
  Administered 2016-05-16: 500 mL via INTRAVENOUS

## 2016-05-16 MED ORDER — ONDANSETRON HCL 4 MG/2ML IJ SOLN
4.0000 mg | INTRAMUSCULAR | Status: DC | PRN
  Administered 2016-05-16: 4 mg via INTRAVENOUS
  Filled 2016-05-16: qty 2

## 2016-05-16 MED ORDER — ONDANSETRON HCL 4 MG PO TABS: 4.0000 mg | ORAL_TABLET | Freq: Four times a day (QID) | ORAL | Status: DC | PRN

## 2016-05-16 MED ORDER — ASPIRIN 81 MG PO CHEW
81.0000 mg | CHEWABLE_TABLET | Freq: Every day | ORAL | Status: DC
  Administered 2016-05-16 – 2016-05-17 (×2): 81 mg via ORAL
  Filled 2016-05-16 (×2): qty 1

## 2016-05-16 MED ORDER — ATENOLOL 25 MG PO TABS
50.0000 mg | ORAL_TABLET | Freq: Every day | ORAL | Status: DC
  Administered 2016-05-16 – 2016-05-22 (×7): 50 mg via ORAL
  Filled 2016-05-16 (×7): qty 1

## 2016-05-16 MED ORDER — SODIUM CHLORIDE 0.9% FLUSH: 3.0000 mL | Freq: Two times a day (BID) | INTRAVENOUS | Status: DC

## 2016-05-16 MED ORDER — SODIUM CHLORIDE 0.9 % IV SOLN
INTRAVENOUS | Status: DC
  Administered 2016-05-16 – 2016-05-23 (×11): via INTRAVENOUS

## 2016-05-16 MED ORDER — HEPARIN SODIUM (PORCINE) 5000 UNIT/ML IJ SOLN
5000.0000 [IU] | Freq: Three times a day (TID) | INTRAMUSCULAR | Status: DC
  Administered 2016-05-17 – 2016-05-22 (×16): 5000 [IU] via SUBCUTANEOUS
  Filled 2016-05-16 (×17): qty 1

## 2016-05-16 MED ORDER — IOPAMIDOL (ISOVUE-300) INJECTION 61%
100.0000 mL | Freq: Once | INTRAVENOUS | Status: AC | PRN
  Administered 2016-05-16: 100 mL via INTRAVENOUS

## 2016-05-16 MED ORDER — HYDROCODONE-ACETAMINOPHEN 5-325 MG PO TABS
1.0000 | ORAL_TABLET | ORAL | Status: DC | PRN
Start: 2016-05-16 — End: 2016-05-17
  Administered 2016-05-16 – 2016-05-17 (×4): 2 via ORAL
  Filled 2016-05-16 (×4): qty 2

## 2016-05-16 MED ORDER — ACETAMINOPHEN 325 MG PO TABS
650.0000 mg | ORAL_TABLET | Freq: Four times a day (QID) | ORAL | Status: DC | PRN
  Administered 2016-05-16: 650 mg via ORAL
  Filled 2016-05-16: qty 2

## 2016-05-16 MED ORDER — ACETAMINOPHEN 650 MG RE SUPP: 650.0000 mg | Freq: Four times a day (QID) | RECTAL | Status: DC | PRN

## 2016-05-16 NOTE — Progress Notes (Signed)
Pt prefers to wear his cpap machine from home.  No frays on cord or obvious defects noted.  Machine turns on and appears to be working properly.  Sterile water added to humidifier.  Pt states he feels comfortable placing it on himself tonight and that his wife can assist him if needed.  Pt was encouraged to call should he need further assistance.  RN aware.

## 2016-05-16 NOTE — ED Provider Notes (Signed)
[facility] DEPT Provider Note   CSN: [id number] Arrival date & time: 05/16/16  [id number]  First Provider Contact:  16      History   Chief Complaint Chief Complaint  Patient presents with  . Abdominal Pain  . Constipation    HPI IHAB WOLTZ is a 70 y.o. male.  HPI  Pt was seen at 1200.  Per pt, c/o gradual onset and worsening of constant generalized abd "pain" for the past 10 days.  Has been associated with multiple intermittent episodes of N/V and "constipation." Describes the abd pain as "aching."  States he also has had "chills" for the past 5 days. States he has been taking miralax without change in his symptoms. Denies diarrhea, no fevers, no back pain, no rash, no CP/SOB, no black or blood in stools or emesis.       Past Medical History:  Diagnosis Date  . CAD (coronary artery disease)    4 stents in right artery  . Complex tear of medial meniscus of left knee as current injury 07/26/2012   [place]  . Diabetes mellitus   . Glaucoma 2006   on drops, followed by [place]  . Hyperlipidemia   . Hypertension   . Myocardial infarction ([place])    1991  . Sleep apnea    uses CPAP regularly    Patient Active Problem List   Diagnosis Date Noted  . Glaucoma   . Health maintenance examination 10/07/2014  . Medicare annual wellness visit, subsequent 10/07/2014  . Advanced care planning/counseling discussion 10/07/2014  . Smoker 10/07/2014  . Bilateral foot pain 09/07/2014  . CAD in native artery 04/17/2014  . Hyperlipidemia with target LDL less than 70 10/08/2013  . Essential hypertension 10/08/2013  . Obesity, Class II, BMI 35-39.9, with comorbidity ([place]) 10/08/2013  . Complex tear of medial meniscus of left knee as current injury 07/26/2012  . OTHER ATOPIC DERMATITIS AND RELATED CONDITIONS 06/24/2010  . PRURITUS 10/08/2009  . LUMBAR RADICULOPATHY 07/18/2007  . Diabetes mellitus type 2, uncontrolled, with complications ([place]) [id number]  . HIP PAIN, BILATERAL  07/05/2007  . MYOCARDIAL INFARCTION, HX OF 05/07/2007  . OSA on CPAP 05/07/2007    Past Surgical History:  Procedure Laterality Date  . APPENDECTOMY     at age 39  . CARDIAC CATHETERIZATION  08/11/2008   angiographically patent LAD diagonal, circumflex and multiple circumflex obtuse marginal branches from the L coronary artery; stenting of mid R coronary artery with 3.5 x 75mm Liberte non DES postdilated w/ 4.0 DuraStent [name])  . CARDIOVASCULAR STRESS TEST  06/16/2011   R/P MV - moderate perfusion defect in basal inferoseptal, basal inferior, mid inferseptal and mid inferior regions, consistent with infarct/scar and/or diaphragmatic attenuation; non-gated study secondary to ectopy; no CP or EKG changes for ischemia; abnormal study although no significant changes from previous study; in the absense of gated images cannot calculate EF or distinguish scar/artifact  . DOPPLER ECHOCARDIOGRAPHY  08/20/2008   EF [id number]; LV systolic function normal; LV mildly dilated; doppler flow suggestive of impaired LV relaxation; RV mildly dilated, RV systolic function normal, RV systolic pressure normal  . PERCUTANEOUS CORONARY STENT INTERVENTION (PCI-S)  1992, 1999   4 vessels [name])       Home Medications    Prior to Admission medications   Medication Sig Start Date End Date Taking? Authorizing Provider  aspirin 81 MG tablet Take 81 mg by mouth daily.      Historical Provider, MD  atenolol (TENORMIN) 50 MG tablet TAKE  ONE & ONE-HALF TABLETS BY MOUTH ONCE DAILY 05/02/16   [name], MD  atorvastatin (LIPITOR) 40 MG tablet TAKE ONE-HALF TABLET BY MOUTH ONCE DAILY 05/02/16   [name], MD  brimonidine [facility]) 0.2 % ophthalmic solution 1 drop 2 (two) times daily.     Historical Provider, MD  clopidogrel (PLAVIX) 75 MG tablet TAKE ONE TABLET BY MOUTH ONCE DAILY 05/02/16   [name], MD  Coenzyme Q10 200 MG capsule Take 200 mg by mouth daily.    Historical Provider, MD  fenofibrate 160  MG tablet Take 1 tablet (160 mg total) by mouth daily. 03/29/16   Troy Sine, MD  glimepiride (AMARYL) 2 MG tablet Take 1 tablet (2 mg total) by mouth daily with breakfast. 10/22/15   [name], MD  Glucosamine-Chondroitin-Vit D3 1500-1200-800 MG-MG-UNIT PACK Take 1 tablet by mouth daily.    Historical Provider, MD  latanoprost (XALATAN) 0.005 % ophthalmic solution Place 1 drop into both eyes at bedtime.     Historical Provider, MD  lisinopril (PRINIVIL,ZESTRIL) 2.5 MG tablet Take 1 tablet (2.5 mg total) by mouth daily. 06/18/14   Troy Sine, MD  metFORMIN (GLUCOPHAGE) 1000 MG tablet TAKE ONE TABLET BY MOUTH TWICE DAILY WITH MEALS 01/31/16   [name], MD  Multiple Vitamin (MULTIVITAMIN) tablet Take 1 tablet by mouth daily.    Historical Provider, MD  omega-3 acid ethyl esters (LOVAZA) 1 g capsule TAKE TWO CAPSULES BY MOUTH TWICE DAILY 11/15/15   [name], MD  Turmeric 450 MG CAPS Take 1 capsule by mouth daily.    Historical Provider, MD    Family History Family History  Problem Relation Age of Onset  . Alzheimer's disease Mother   . Heart disease Father   . Heart attack Brother   . Diabetes Brother   . Heart disease Maternal Grandmother   . Cancer Maternal Grandfather   . Diabetes Paternal Grandfather     Social History Social History  Substance Use Topics  . Smoking status: Current Some Day Smoker    Packs/day: 0.00    Years: 50.00    Types: Cigarettes    Start date: 10/23/1958    Last attempt to quit: 10/11/2011  . Smokeless tobacco: Never Used  . Alcohol use 0.0 oz/week     Comment: occ     Allergies   Niacin and related   Review of Systems Review of Systems ROS: Statement: All systems negative except as marked or noted in the HPI; Constitutional: Negative for objective fever and +chills. ; ; Eyes: Negative for eye pain, redness and discharge. ; ; ENMT: Negative for ear pain, hoarseness, nasal congestion, sinus pressure and sore throat. ; ;  Cardiovascular: Negative for chest pain, palpitations, diaphoresis, dyspnea and peripheral edema. ; ; Respiratory: Negative for cough, wheezing and stridor. ; ; Gastrointestinal: +N/V, constipation, abd pain. Negative for diarrhea, blood in stool, hematemesis, jaundice and rectal bleeding. . ; ; Genitourinary: Negative for dysuria, flank pain and hematuria. ; ; Musculoskeletal: Negative for back pain and neck pain. Negative for swelling and trauma.; ; Skin: Negative for pruritus, rash, abrasions, blisters, bruising and skin lesion.; ; Neuro: Negative for headache, lightheadedness and neck stiffness. Negative for weakness, altered level of consciousness, altered mental status, extremity weakness, paresthesias, involuntary movement, seizure and syncope.       Physical Exam Updated Vital Signs BP 124/61 (BP Location: Right Arm)   Pulse 70   Temp 98.3 F (36.8 C) (Oral)   Resp 20   SpO2  93%   Physical Exam 1205: Physical examination:  Nursing notes reviewed; Vital signs and O2 SAT reviewed;  Constitutional: Well developed, Well nourished, Well hydrated, Uncomfortable appearing.; Head:  Normocephalic, atraumatic; Eyes: EOMI, PERRL, No scleral icterus; ENMT: Mouth and pharynx normal, Mucous membranes moist; Neck: Supple, Full range of motion, No lymphadenopathy; Cardiovascular: Regular rate and rhythm, No gallop; Respiratory: Breath sounds clear & equal bilaterally, No wheezes.  Speaking full sentences with ease, Normal respiratory effort/excursion; Chest: Nontender, Movement normal; Abdomen: Soft, +distended, diffusely tender. Normal bowel sounds; Genitourinary: No CVA tenderness; Extremities: Pulses normal, No tenderness, No edema, No calf edema or asymmetry.; Neuro: AA&Ox3, Major CN grossly intact.  Speech clear. No gross focal motor or sensory deficits in extremities.; Skin: Color normal, Warm, Dry.   ED Treatments / Results  Labs (all labs ordered are listed, but only abnormal results are  displayed)  EKG  EKG Interpretation None       Radiology   Procedures Procedures (including critical care time)  Medications Ordered in ED Medications  0.9 %  sodium chloride infusion (not administered)  morphine 4 MG/ML injection 4 mg (not administered)  ondansetron (ZOFRAN) injection 4 mg (not administered)  iopamidol (ISOVUE-300) 61 % injection 100 mL (100 mLs Intravenous Contrast Given 05/16/16 1235)     Initial Impression / Assessment and Plan / ED Course  I have reviewed the triage vital signs and the nursing notes.  Pertinent labs & imaging results that were available during my care of the patient were reviewed by me and considered in my medical decision making (see chart for details).  MDM Reviewed: previous chart, nursing note and vitals Reviewed previous: labs Interpretation: labs, x-ray and CT scan    Results for orders placed or performed during the hospital encounter of 05/16/16  Lipase, blood  Result Value Ref Range   Lipase 11 11 - 51 U/L  Comprehensive metabolic panel  Result Value Ref Range   Sodium 133 (L) 135 - 145 mmol/L   Potassium 4.3 3.5 - 5.1 mmol/L   Chloride 101 101 - 111 mmol/L   CO2 19 (L) 22 - 32 mmol/L   Glucose, Bld 246 (H) 65 - 99 mg/dL   BUN 24 (H) 6 - 20 mg/dL   Creatinine, Ser 1.21 0.61 - 1.24 mg/dL   Calcium 9.4 8.9 - 10.3 mg/dL   Total Protein 8.0 6.5 - 8.1 g/dL   Albumin 4.0 3.5 - 5.0 g/dL   AST 20 15 - 41 U/L   ALT 27 17 - 63 U/L   Alkaline Phosphatase 58 38 - 126 U/L   Total Bilirubin 1.3 (H) 0.3 - 1.2 mg/dL   GFR calc non Af Amer 59 (L) >60 mL/min   GFR calc Af Amer >60 >60 mL/min   Anion gap 13 5 - 15  CBC  Result Value Ref Range   WBC 5.1 4.0 - 10.5 K/uL   RBC 4.19 (L) 4.22 - 5.81 MIL/uL   Hemoglobin 13.4 13.0 - 17.0 g/dL   HCT 39.2 39.0 - 52.0 %   MCV 93.6 78.0 - 100.0 fL   MCH 32.0 26.0 - 34.0 pg   MCHC 34.2 30.0 - 36.0 g/dL   RDW 13.3 11.5 - 15.5 %   Platelets 220 150 - 400 K/uL  Urinalysis, Routine w  reflex microscopic  Result Value Ref Range   Color, Urine ORANGE (A) YELLOW   APPearance CLOUDY (A) CLEAR   Specific Gravity, Urine 1.034 (H) 1.005 - 1.030   pH 5.5  5.0 - 8.0   Glucose, UA >1000 (A) NEGATIVE mg/dL   Hgb urine dipstick NEGATIVE NEGATIVE   Bilirubin Urine SMALL (A) NEGATIVE   Ketones, ur 15 (A) NEGATIVE mg/dL   Protein, ur 30 (A) NEGATIVE mg/dL   Nitrite POSITIVE (A) NEGATIVE   Leukocytes, UA SMALL (A) NEGATIVE  Lactic acid, plasma  Result Value Ref Range   Lactic Acid, Venous 2.3 (HH) 0.5 - 1.9 mmol/L  Urine microscopic-add on  Result Value Ref Range   Squamous Epithelial / LPF 0-5 (A) NONE SEEN   WBC, UA 0-5 0 - 5 WBC/hpf   RBC / HPF NONE SEEN 0 - 5 RBC/hpf   Bacteria, UA MANY (A) NONE SEEN   Casts HYALINE CASTS (A) NEGATIVE   Crystals CA OXALATE CRYSTALS (A) NEGATIVE   Ct Abdomen Pelvis W Contrast Result Date: 05/16/2016 CLINICAL DATA:  70 year old male with abdominal and pelvic pain and chills for 5 days. EXAM: CT ABDOMEN AND PELVIS WITH CONTRAST TECHNIQUE: Multidetector CT imaging of the abdomen and pelvis was performed using the standard protocol following bolus administration of intravenous contrast. CONTRAST:  112mL ISOVUE-300 IOPAMIDOL (ISOVUE-300) INJECTION 61% COMPARISON:  05/16/2016 and prior radiographs FINDINGS: Lower chest: Cardiomegaly and moderate to heavy coronary artery calcifications noted. Hepatobiliary: The liver is unremarkable. A 1.1 cm calcified gallstone is noted. There is no CT evidence of acute cholecystitis. No biliary dilatation identified. Pancreas: Unremarkable Spleen: Unremarkable Adrenals/Urinary Tract: Mild bilateral renal atrophy noted with small bilateral renal cysts. A 2 x 4 cm left adrenal mass has a relative washout of 40%, consistent with an adenoma. The right adrenal gland and bladder are unremarkable. Stomach/Bowel: Moderate inflammation within the mid pelvis is noted with a 5 cm ill-defined collection/developing abscess (image 65).  A tiny focus of extraluminal gas is noted (image 62). This inflammation and collection lies immediately adjacent to small bowel and a portion of the sigmoid colon. Colonic diverticulosis is noted. There is upper limits of normal caliber proximal small bowel likely representing ileus. Vascular/Lymphatic: Aortic atherosclerotic calcifications noted without aneurysm. No enlarged lymph nodes identified. Reproductive: Unremarkable Other: No free fluid noted. Musculoskeletal: No acute or suspicious abnormality. IMPRESSION: 5 cm collection/ developing abscess within the mid pelvis with adjacent inflammation and tiny focus of intraluminal gas. This compatible with focal bowel perforation, but difficult to determine origin from small bowel or the sigmoid colon (more likely). Small amount of free fluid within the abdomen and pelvis. Cholelithiasis without CT evidence of acute cholecystitis. Left adrenal adenoma. Abdominal aortic atherosclerosis. These results were discussed with Dr. Thurnell Garbe on 05/16/2016 at 1:50 p.m. Electronically Signed   By: Margarette Canada M.D.   On: 05/16/2016 13:52  Dg Abd Acute W/chest Result Date: 05/16/2016 CLINICAL DATA:  Constipation with lower abdominal pain EXAM: DG ABDOMEN ACUTE W/ 1V CHEST COMPARISON:  Chest radiograph October 11, 2011 FINDINGS: PA chest: Lungs are clear. Heart size and pulmonary vascularity are normal. No adenopathy. There are foci of atherosclerotic calcification in each carotid artery. Supine and upright abdomen: There is moderate stool throughout colon. There is no appreciable bowel dilatation. There are scattered air-fluid levels in the abdomen, however. No free air is evident. There is a small phlebolith in the pelvis. IMPRESSION: Scattered air-fluid levels without bowel dilatation. Question early ileus or enteritis. Bowel obstruction is felt to be less likely. No free air. Moderate stool throughout colon. No lung edema or consolidation. There are foci of carotid artery  calcification bilaterally. Electronically Signed   By: Lowella Grip III  M.D.   On: 05/16/2016 12:44    1405:  CT scan as above. +UTI, UC pending. IV abx ordered. Dx and testing d/w pt and family.  Questions answered.  Verb understanding, agreeable to admit.  T/C to General Surgery Dr. Harlow Asa, case discussed, including:  HPI, pertinent PM/SHx, VS/PE, dx testing, ED course and treatment:  Agreeable to consult, requests to admit to [facility].  T/C to [facility] Dr. Hartford Poli, case discussed, including:  HPI, pertinent PM/SHx, VS/PE, dx testing, ED course and treatment:  Agreeable to admit, requests to write temporary orders, obtain inpt tele bed to team [facility].      Final Clinical Impressions(s) / ED Diagnoses   Final diagnoses:  None    New Prescriptions New Prescriptions   No medications on file     Francine Graven, DO 05/17/16 1535

## 2016-05-16 NOTE — Telephone Encounter (Signed)
Noted. Currently in ER. 

## 2016-05-16 NOTE — Progress Notes (Signed)
Pharmacy Antibiotic Note  Evan Avery is a 70 y.o. male admitted on 05/16/2016 with intra-abdominal abscess.  Patient presented with complaint of abdominal pain and constipation, with symptoms beginning 10 days ago.  CT of abdomen shows abscess.  Patient received initial Zosyn 3.375gm IV x 1 dose.  Pharmacy has been consulted for Zosyn dosing.  Plan:  Zosyn 3.375gm IV q8h (each dose infused over 4 hrs)  F/U urine culture, renal function  Height: 5\' 8"  (172.7 cm) Weight: 250 lb (113.4 kg) IBW/kg (Calculated) : 68.4  Temp (24hrs), Avg:99.8 F (37.7 C), Min:98.3 F (36.8 C), Max:101.3 F (38.5 C)   Recent Labs Lab 05/16/16 1031 05/16/16 1314  WBC 5.1  --   CREATININE 1.21  --   LATICACIDVEN  --  2.3*    Estimated Creatinine Clearance: 69.4 mL/min (by C-G formula based on SCr of 1.21 mg/dL).    Allergies  Allergen Reactions  . Niacin And Related Other (See Comments)    Extreme fatigue    Antimicrobials this admission: 7/25 Zosyn >>    Dose adjustments this admission:    Microbiology results: 7/25 UCx:     Thank you for allowing pharmacy to be a part of this patient's care.  Everette Rank, PharmD 05/16/2016 5:03 PM

## 2016-05-16 NOTE — ED Notes (Signed)
Off floor for testing 

## 2016-05-16 NOTE — H&P (Addendum)
History and Physical    Evan Avery [id number] DOB: [birth year] DOA: 05/16/2016  PCP: [name], MD  Patient coming from: Home seen with wife at bedside  Chief Complaint: Abdominal pain  HPI: Evan Avery is a 70 y.o. male with medical history significant of CAD with a stent in 1992, obesity and OSA came to the hospital complaining about abdominal pain. His symptoms started about 10 days ago, he also had obstipation and tried multiple OTC laxatives without success. About 45 days ago started to have chills and he came in this morning because of the pain and chills worsens. CT scan showed intra-abdominal abscess. After admission to the hospital patient developed fever of 101.3, along with his RR of 21 and positive lactic acid this is considered to be sepsis syndrome.  ED Course:  Vitals: Stable Labs: WNL except slightly elevated lactate at 2.3. Imaging: CT abdomen and pelvis showed 5 cm developing abscess in the mid pelvis, likely secondary to sigmoid colon perforation and less likely could be from small bowel. Interventions: Started on Zosyn  Review of Systems:  Constitutional: Chills Eyes: negative for irritation, redness and visual disturbance Ears, nose, mouth, throat, and face: negative for earaches, epistaxis, nasal congestion and sore throat Respiratory: negative for cough, dyspnea on exertion, sputum and wheezing Cardiovascular: negative for chest pain, dyspnea, lower extremity edema, orthopnea, palpitations and syncope Gastrointestinal: Abdominal pain and constipation Genitourinary:negative for dysuria, frequency and hematuria Hematologic/lymphatic: negative for bleeding, easy bruising and lymphadenopathy Musculoskeletal:negative for arthralgias, muscle weakness and stiff joints Neurological: negative for coordination problems, gait problems, headaches and weakness Endocrine: negative for diabetic symptoms including polydipsia, polyuria and weight  loss Allergic/Immunologic: negative for anaphylaxis, hay fever and urticaria  Past Medical History:  Diagnosis Date  . CAD (coronary artery disease)    4 stents in right artery  . Complex tear of medial meniscus of left knee as current injury 07/26/2012   [place]  . Diabetes mellitus   . Glaucoma 2006   on drops, followed by [place]  . Hyperlipidemia   . Hypertension   . Myocardial infarction ([place])    1991  . Sleep apnea    uses CPAP regularly    Past Surgical History:  Procedure Laterality Date  . APPENDECTOMY     at age 85  . CARDIAC CATHETERIZATION  08/11/2008   angiographically patent LAD diagonal, circumflex and multiple circumflex obtuse marginal branches from the L coronary artery; stenting of mid R coronary artery with 3.5 x 46mm Liberte non DES postdilated w/ 4.0 DuraStent [name])  . CARDIOVASCULAR STRESS TEST  06/16/2011   R/P MV - moderate perfusion defect in basal inferoseptal, basal inferior, mid inferseptal and mid inferior regions, consistent with infarct/scar and/or diaphragmatic attenuation; non-gated study secondary to ectopy; no CP or EKG changes for ischemia; abnormal study although no significant changes from previous study; in the absense of gated images cannot calculate EF or distinguish scar/artifact  . DOPPLER ECHOCARDIOGRAPHY  08/20/2008   EF [id number]; LV systolic function normal; LV mildly dilated; doppler flow suggestive of impaired LV relaxation; RV mildly dilated, RV systolic function normal, RV systolic pressure normal  . PERCUTANEOUS CORONARY STENT INTERVENTION (PCI-S)  1992, 1999   4 vessels [name])     reports that he has been smoking Cigarettes.  He started smoking about 57 years ago. He has been smoking about 0.00 packs per day for the past 50.00 years. He has never used smokeless tobacco. He reports that he drinks alcohol. He reports that he  does not use drugs.  Allergies  Allergen Reactions  . Niacin And Related Other (See Comments)    Extreme fatigue     Family History  Problem Relation Age of Onset  . Alzheimer's disease Mother   . Heart disease Father   . Heart attack Brother   . Diabetes Brother   . Heart disease Maternal Grandmother   . Cancer Maternal Grandfather   . Diabetes Paternal Grandfather     Prior to Admission medications   Medication Sig Start Date End Date Taking? Authorizing Provider  aspirin 81 MG tablet Take 81 mg by mouth daily.     Yes Historical Provider, MD  atenolol (TENORMIN) 50 MG tablet TAKE ONE & ONE-HALF TABLETS BY MOUTH ONCE DAILY Patient taking differently: TAKE ONE & ONE-HALF TABLETS =75mg  BY MOUTH ONCE DAILY 05/02/16  Yes [name], MD  atorvastatin (LIPITOR) 40 MG tablet TAKE ONE-HALF TABLET BY MOUTH ONCE DAILY Patient taking differently: TAKE ONE-HALF TABLET= 20mg  BY MOUTH ONCE DAILY 05/02/16  Yes [name], MD  brimonidine (ALPHAGAN) 0.2 % ophthalmic solution Place 1 drop into both eyes 2 (two) times daily.    Yes Historical Provider, MD  clopidogrel (PLAVIX) 75 MG tablet TAKE ONE TABLET BY MOUTH ONCE DAILY 05/02/16  Yes [name], MD  Coenzyme Q10 200 MG capsule Take 200 mg by mouth daily.   Yes Historical Provider, MD  glimepiride (AMARYL) 2 MG tablet Take 1 tablet (2 mg total) by mouth daily with breakfast. 10/22/15  Yes [name], MD  Glucosamine-Chondroitin-Vit D3 1500-1200-800 MG-MG-UNIT PACK Take 1 tablet by mouth daily.   Yes Historical Provider, MD  latanoprost (XALATAN) 0.005 % ophthalmic solution Place 1 drop into both eyes at bedtime.    Yes Historical Provider, MD  lisinopril (PRINIVIL,ZESTRIL) 2.5 MG tablet Take 1 tablet (2.5 mg total) by mouth daily. 06/18/14  Yes Troy Sine, MD  metFORMIN (GLUCOPHAGE) 1000 MG tablet TAKE ONE TABLET BY MOUTH TWICE DAILY WITH MEALS 01/31/16  Yes [name], MD  Multiple Vitamin (MULTIVITAMIN) tablet Take 1 tablet by mouth daily.   Yes Historical Provider, MD  omega-3 acid ethyl esters (LOVAZA) 1 g capsule TAKE TWO  CAPSULES BY MOUTH TWICE DAILY 11/15/15  Yes [name], MD  Turmeric 450 MG CAPS Take 1 capsule by mouth daily.   Yes Historical Provider, MD  fenofibrate 160 MG tablet Take 1 tablet (160 mg total) by mouth daily. Patient not taking: Reported on 05/16/2016 03/29/16   Troy Sine, MD    Physical Exam:  Vitals:   05/16/16 1213 05/16/16 1300 05/16/16 1400 05/16/16 1428  BP: 124/61 111/67 109/67 106/58  Pulse: 70 72 89 (!) 47  Resp: 20   18  Temp: 98.3 F (36.8 C)     TempSrc: Oral     SpO2: 93% 94% 91% 94%    Constitutional: NAD, calm, comfortable Eyes: PERRL, lids and conjunctivae normal ENMT: Mucous membranes are moist. Posterior pharynx clear of any exudate or lesions.Normal dentition.  Neck: normal, supple, no masses, no thyromegaly Respiratory: clear to auscultation bilaterally, no wheezing, no crackles. Normal respiratory effort. No accessory muscle use.  Cardiovascular: Regular rate and rhythm, no murmurs / rubs / gallops. No extremity edema. 2+ pedal pulses. No carotid bruits.  Abdomen: no tenderness, no masses palpated. No hepatosplenomegaly. Bowel sounds positive.  Musculoskeletal: no clubbing / cyanosis. No joint deformity upper and lower extremities. Good ROM, no contractures. Normal muscle tone.  Skin: no rashes, lesions, ulcers. No induration Neurologic: CN 2-12  grossly intact. Sensation intact, DTR normal. Strength 5/5 in all 4.  Psychiatric: Normal judgment and insight. Alert and oriented x 3. Normal mood.   Labs on Admission: I have personally reviewed following labs and imaging studies  CBC:  Recent Labs Lab 05/16/16 1031  WBC 5.1  HGB 13.4  HCT 39.2  MCV 93.6  PLT [id number]   Basic Metabolic Panel:  Recent Labs Lab 05/16/16 1031  NA 133*  K 4.3  CL 101  CO2 19*  GLUCOSE 246*  BUN 24*  CREATININE 1.21  CALCIUM 9.4   GFR: CrCl cannot be calculated (Unknown ideal weight.). Liver Function Tests:  Recent Labs Lab 05/16/16 1031  AST 20  ALT 27   ALKPHOS 58  BILITOT 1.3*  PROT 8.0  ALBUMIN 4.0    Recent Labs Lab 05/16/16 1031  LIPASE 11   No results for input(s): AMMONIA in the last 168 hours. Coagulation Profile: No results for input(s): INR, PROTIME in the last 168 hours. Cardiac Enzymes: No results for input(s): CKTOTAL, CKMB, CKMBINDEX, TROPONINI in the last 168 hours. BNP (last 3 results) No results for input(s): PROBNP in the last 8760 hours. HbA1C: No results for input(s): HGBA1C in the last 72 hours. CBG: No results for input(s): GLUCAP in the last 168 hours. Lipid Profile: No results for input(s): CHOL, HDL, LDLCALC, TRIG, CHOLHDL, LDLDIRECT in the last 72 hours. Thyroid Function Tests: No results for input(s): TSH, T4TOTAL, FREET4, T3FREE, THYROIDAB in the last 72 hours. Anemia Panel: No results for input(s): VITAMINB12, FOLATE, FERRITIN, TIBC, IRON, RETICCTPCT in the last 72 hours. Urine analysis:    Component Value Date/Time   COLORURINE ORANGE (A) 05/16/2016 1223   APPEARANCEUR CLOUDY (A) 05/16/2016 1223   LABSPEC 1.034 (H) 05/16/2016 1223   PHURINE 5.5 05/16/2016 1223   GLUCOSEU >1000 (A) 05/16/2016 1223   HGBUR NEGATIVE 05/16/2016 1223   HGBUR negative 05/17/2007 0835   BILIRUBINUR SMALL (A) 05/16/2016 1223   BILIRUBINUR n 02/28/2011   KETONESUR 15 (A) 05/16/2016 1223   PROTEINUR 30 (A) 05/16/2016 1223   UROBILINOGEN 1.0 10/11/2011 0215   NITRITE POSITIVE (A) 05/16/2016 1223   LEUKOCYTESUR SMALL (A) 05/16/2016 1223   Sepsis Labs: !!!!!!!!!!!!!!!!!!!!!!!!!!!!!!!!!!!!!!!!!!!! Invalid input(s): PROCALCITONIN, LACTICIDVEN No results found for this or any previous visit (from the past 240 hour(s)).   Radiological Exams on Admission: Ct Abdomen Pelvis W Contrast  Result Date: 05/16/2016 CLINICAL DATA:  70 year old male with abdominal and pelvic pain and chills for 5 days. EXAM: CT ABDOMEN AND PELVIS WITH CONTRAST TECHNIQUE: Multidetector CT imaging of the abdomen and pelvis was performed using  the standard protocol following bolus administration of intravenous contrast. CONTRAST:  182mL ISOVUE-300 IOPAMIDOL (ISOVUE-300) INJECTION 61% COMPARISON:  05/16/2016 and prior radiographs FINDINGS: Lower chest: Cardiomegaly and moderate to heavy coronary artery calcifications noted. Hepatobiliary: The liver is unremarkable. A 1.1 cm calcified gallstone is noted. There is no CT evidence of acute cholecystitis. No biliary dilatation identified. Pancreas: Unremarkable Spleen: Unremarkable Adrenals/Urinary Tract: Mild bilateral renal atrophy noted with small bilateral renal cysts. A 2 x 4 cm left adrenal mass has a relative washout of 40%, consistent with an adenoma. The right adrenal gland and bladder are unremarkable. Stomach/Bowel: Moderate inflammation within the mid pelvis is noted with a 5 cm ill-defined collection/developing abscess (image 65). A tiny focus of extraluminal gas is noted (image 62). This inflammation and collection lies immediately adjacent to small bowel and a portion of the sigmoid colon. Colonic diverticulosis is noted. There is upper limits of  normal caliber proximal small bowel likely representing ileus. Vascular/Lymphatic: Aortic atherosclerotic calcifications noted without aneurysm. No enlarged lymph nodes identified. Reproductive: Unremarkable Other: No free fluid noted. Musculoskeletal: No acute or suspicious abnormality. IMPRESSION: 5 cm collection/ developing abscess within the mid pelvis with adjacent inflammation and tiny focus of intraluminal gas. This compatible with focal bowel perforation, but difficult to determine origin from small bowel or the sigmoid colon (more likely). Small amount of free fluid within the abdomen and pelvis. Cholelithiasis without CT evidence of acute cholecystitis. Left adrenal adenoma. Abdominal aortic atherosclerosis. These results were discussed with Dr. Thurnell Garbe on 05/16/2016 at 1:50 p.m. Electronically Signed   By: Margarette Canada M.D.   On: 05/16/2016  13:52  Dg Abd Acute W/chest  Result Date: 05/16/2016 CLINICAL DATA:  Constipation with lower abdominal pain EXAM: DG ABDOMEN ACUTE W/ 1V CHEST COMPARISON:  Chest radiograph October 11, 2011 FINDINGS: PA chest: Lungs are clear. Heart size and pulmonary vascularity are normal. No adenopathy. There are foci of atherosclerotic calcification in each carotid artery. Supine and upright abdomen: There is moderate stool throughout colon. There is no appreciable bowel dilatation. There are scattered air-fluid levels in the abdomen, however. No free air is evident. There is a small phlebolith in the pelvis. IMPRESSION: Scattered air-fluid levels without bowel dilatation. Question early ileus or enteritis. Bowel obstruction is felt to be less likely. No free air. Moderate stool throughout colon. No lung edema or consolidation. There are foci of carotid artery calcification bilaterally. Electronically Signed   By: Lowella Grip III M.D.   On: 05/16/2016 12:44   EKG: Independently reviewed.   Assessment/Plan Principal Problem:   Intra-abdominal abscess (HCC) Active Problems:   Diabetes mellitus type 2, uncontrolled, with complications (HCC)   OSA on CPAP   Sepsis ([place])   Essential hypertension   Obesity, Class II, BMI 35-39.9, with comorbidity (HCC)    Sepsis -Fever of 101.3, heart rate of 20 and intra-abdominal abscess consistent with sepsis, present on admission. -Lactic acid slightly elevated at 2.3 consistent with end organ damage. -Given bolus of 3.5 L based on his weight, continue IV fluids. -Continue broad-spectrum (Zosyn) antibiotics for intra-abdominal sepsis.  Intra-abdominal abscess -5 cm mid pelvic intra-abdominal abscesses likely due to to sigmoid colonic diverticulosis, less likely from small bowel. -Patient is stable, does not look septic. -Started on IV fluid and Zosyn. Continue NPO and opioid pain medications. -General surgery to evaluate.  Obstipation -Did not have a BM for  10 days, likely secondary to the intra-abdominal abscess. -No evidence of large stool burden on the CT scan  OSA on CPAP -Started on CPAP at night, counseled about weight loss.  Diabetes mellitus type 2 -Check hemoglobin A1c, hold glipizide and metformin. -Patient is nothing by mouth but will place on SSI if needed.  Essential hypertension -Hold blood pressure medications except atenolol to prevent reflex tachycardia.  History of CAD -Remote history of 4 stents in the 90s, patient is on aspirin and Plavix, hold Plavix and continue low-dose aspirin.  Cholelithiasis -Cholelithiasis without evidence of cholecystitis, calcified stone measuring about 1.1 cm in the gallbladder.   DVT prophylaxis: SQ Heparin Code Status: Full code Family Communication: Plan D/W patient Disposition Plan: Home Consults called: Dr. Harlow Asa of [facility] Admission status: TELE, inpatient   [facility] A MD [facility] Pager [phone]  If 7PM-7AM, please contact night-coverage [email or web address] Password [facility]  05/16/2016, 3:15 PM

## 2016-05-16 NOTE — Consult Note (Signed)
General Surgery [facility]  Reason for Consult: Intra-abdominal abscess  Referring Physician: Dr. Hartford Avery, [facility]  Evan Avery is an 70 y.o. male.  HPI: patient is a 70 yo male with greater than one week history of abdominal pain, constipation.  Pain has increased and patient developed fever and chills.  Has taken Miralax for 3 days without results.  Presented to ER for assessment.  WBC normal at 5K.  CT abd positive for approx 5 cm abscess in the upper pelvis, likely diverticular in origin.  Admit to medical service and general surgery asked to consult.  Previous appendectomy as a child.  No clinical history of diverticular disease.  Past Medical History:  Diagnosis Date  . CAD (coronary artery disease)    4 stents in right artery  . Complex tear of medial meniscus of left knee as current injury 07/26/2012   Evan Avery  . Evan mellitus   . Glaucoma 2006   on drops, followed by Evan Avery  . Hyperlipidemia   . Hypertension   . Myocardial infarction (Evan Avery)    1991  . Sleep apnea    uses CPAP regularly    Past Surgical History:  Procedure Laterality Date  . APPENDECTOMY     at age 42  . CARDIAC CATHETERIZATION  08/11/2008   angiographically patent LAD diagonal, circumflex and multiple circumflex obtuse marginal branches from the L coronary artery; stenting of mid R coronary artery with 3.5 x 87m Liberte non DES postdilated w/ 4.0 DuraStent (Evan Avery  . CARDIOVASCULAR STRESS TEST  06/16/2011   R/P MV - moderate perfusion defect in basal inferoseptal, basal inferior, mid inferseptal and mid inferior regions, consistent with infarct/scar and/or diaphragmatic attenuation; non-gated study secondary to ectopy; no CP or EKG changes for ischemia; abnormal study although no significant changes from previous study; in the absense of gated images cannot calculate EF or distinguish scar/artifact  . DOPPLER ECHOCARDIOGRAPHY  08/20/2008   EF >>70% LV systolic function  normal; LV mildly dilated; doppler flow suggestive of impaired LV relaxation; RV mildly dilated, RV systolic function normal, RV systolic pressure normal  . PERCUTANEOUS CORONARY STENT INTERVENTION (PCI-S)  1992, 1999   4 vessels (Evan Avery    Family History  Problem Relation Age of Onset  . Evan Avery   . Evan Avery   . Evan Avery   . Evan Avery   . Evan Avery   . Evan Avery   . Evan Avery     Social History:  reports that he has been smoking Cigarettes.  He started smoking about 57 years ago. He has been smoking about 0.00 packs per day for the past 50.00 years. He has never used smokeless tobacco. He reports that he drinks alcohol. He reports that he does not use drugs.  Allergies:  Allergies  Allergen Reactions  . Niacin And Related Other (See Comments)    Extreme fatigue    Medications: I have reviewed the patient's current medications.  Results for orders placed or performed during the hospital encounter of 05/16/16 (from the past 48 hour(s))  Lipase, blood     Status: None   Collection Time: 05/16/16 10:31 AM  Result Value Ref Range   Lipase 11 11 - 51 U/L  Comprehensive metabolic panel     Status: Abnormal   Collection Time: 05/16/16 10:31 AM  Result Value Ref Range   Sodium 133 (L) 135 - 145 mmol/L   Potassium 4.3 3.5 -  5.1 mmol/L   Chloride 101 101 - 111 mmol/L   CO2 19 (L) 22 - 32 mmol/L   Glucose, Bld 246 (H) 65 - 99 mg/dL   BUN 24 (H) 6 - 20 mg/dL   Creatinine, Ser 1.21 0.61 - 1.24 mg/dL   Calcium 9.4 8.9 - 10.3 mg/dL   Total Protein 8.0 6.5 - 8.1 g/dL   Albumin 4.0 3.5 - 5.0 g/dL   AST 20 15 - 41 U/L   ALT 27 17 - 63 U/L   Alkaline Phosphatase 58 38 - 126 U/L   Total Bilirubin 1.3 (H) 0.3 - 1.2 mg/dL   GFR calc non Af Amer 59 (L) >60 mL/min   GFR calc Af Amer >60 >60 mL/min    Comment: (NOTE) The eGFR has been calculated using the CKD EPI equation. This  calculation has not been validated in all clinical situations. eGFR's persistently <60 mL/min signify possible Chronic Kidney Disease.    Anion gap 13 5 - 15  CBC     Status: Abnormal   Collection Time: 05/16/16 10:31 AM  Result Value Ref Range   WBC 5.1 4.0 - 10.5 K/uL   RBC 4.19 (L) 4.22 - 5.81 MIL/uL   Hemoglobin 13.4 13.0 - 17.0 g/dL   HCT 39.2 39.0 - 52.0 %   MCV 93.6 78.0 - 100.0 fL   MCH 32.0 26.0 - 34.0 pg   MCHC 34.2 30.0 - 36.0 g/dL   RDW 13.3 11.5 - 15.5 %   Platelets 220 150 - 400 K/uL  Urinalysis, Routine w reflex microscopic     Status: Abnormal   Collection Time: 05/16/16 12:23 PM  Result Value Ref Range   Color, Urine ORANGE (A) YELLOW    Comment: BIOCHEMICALS MAY BE AFFECTED BY COLOR   APPearance CLOUDY (A) CLEAR   Specific Gravity, Urine 1.034 (H) 1.005 - 1.030   pH 5.5 5.0 - 8.0   Glucose, UA >1000 (A) NEGATIVE mg/dL   Hgb urine dipstick NEGATIVE NEGATIVE   Bilirubin Urine SMALL (A) NEGATIVE   Ketones, ur 15 (A) NEGATIVE mg/dL   Protein, ur 30 (A) NEGATIVE mg/dL   Nitrite POSITIVE (A) NEGATIVE   Leukocytes, UA SMALL (A) NEGATIVE  Urine microscopic-add on     Status: Abnormal   Collection Time: 05/16/16 12:23 PM  Result Value Ref Range   Squamous Epithelial / LPF 0-5 (A) NONE SEEN   WBC, UA 0-5 0 - 5 WBC/hpf   RBC / HPF NONE SEEN 0 - 5 RBC/hpf   Bacteria, UA MANY (A) NONE SEEN   Casts HYALINE CASTS (A) NEGATIVE   Crystals CA OXALATE CRYSTALS (A) NEGATIVE  Lactic acid, plasma     Status: Abnormal   Collection Time: 05/16/16  1:14 PM  Result Value Ref Range   Lactic Acid, Venous 2.3 (HH) 0.5 - 1.9 mmol/L    Comment: CRITICAL RESULT CALLED TO, READ BACK BY AND VERIFIED WITH: Evan Avery @ 1351 ON [id number] BY Evan Avery     Ct Abdomen Pelvis W Contrast  Result Date: 05/16/2016 CLINICAL DATA:  70 year old male with abdominal and pelvic pain and chills for 5 days. EXAM: CT ABDOMEN AND PELVIS WITH CONTRAST TECHNIQUE: Multidetector CT imaging of the abdomen and  pelvis was performed using the standard protocol following bolus administration of intravenous contrast. CONTRAST:  118m ISOVUE-300 IOPAMIDOL (ISOVUE-300) INJECTION 61% COMPARISON:  05/16/2016 and prior radiographs FINDINGS: Lower chest: Cardiomegaly and moderate to heavy coronary artery calcifications noted. Hepatobiliary: The liver is unremarkable. A 1.1 cm  calcified gallstone is noted. There is no CT evidence of acute cholecystitis. No biliary dilatation identified. Pancreas: Unremarkable Spleen: Unremarkable Adrenals/Urinary Tract: Mild bilateral renal atrophy noted with small bilateral renal cysts. A 2 x 4 cm left adrenal mass has a relative washout of 40%, consistent with an adenoma. The right adrenal gland and bladder are unremarkable. Stomach/Bowel: Moderate inflammation within the mid pelvis is noted with a 5 cm ill-defined collection/developing abscess (image 65). A tiny focus of extraluminal gas is noted (image 62). This inflammation and collection lies immediately adjacent to small bowel and a portion of the sigmoid colon. Colonic diverticulosis is noted. There is upper limits of normal caliber proximal small bowel likely representing ileus. Vascular/Lymphatic: Aortic atherosclerotic calcifications noted without aneurysm. No enlarged lymph nodes identified. Reproductive: Unremarkable Other: No free fluid noted. Musculoskeletal: No acute or suspicious abnormality. IMPRESSION: 5 cm collection/ developing abscess within the mid pelvis with adjacent inflammation and tiny focus of intraluminal gas. This compatible with focal bowel perforation, but difficult to determine origin from small bowel or the sigmoid colon (more likely). Small amount of free fluid within the abdomen and pelvis. Cholelithiasis without CT evidence of acute cholecystitis. Left adrenal adenoma. Abdominal aortic atherosclerosis. These results were discussed with Dr. Thurnell Garbe on 05/16/2016 at 1:50 p.m. Electronically Signed   By: Margarette Canada M.D.   On: 05/16/2016 13:52  Dg Abd Acute W/chest  Result Date: 05/16/2016 CLINICAL DATA:  Constipation with lower abdominal pain EXAM: DG ABDOMEN ACUTE W/ 1V CHEST COMPARISON:  Chest radiograph October 11, 2011 FINDINGS: PA chest: Lungs are clear. Evan size and pulmonary vascularity are normal. No adenopathy. There are foci of atherosclerotic calcification in each carotid artery. Supine and upright abdomen: There is moderate stool throughout colon. There is no appreciable bowel dilatation. There are scattered air-fluid levels in the abdomen, however. No free air is evident. There is a small phlebolith in the pelvis. IMPRESSION: Scattered air-fluid levels without bowel dilatation. Question early ileus or enteritis. Bowel obstruction is felt to be less likely. No free air. Moderate stool throughout colon. No lung edema or consolidation. There are foci of carotid artery calcification bilaterally. Electronically Signed   By: Lowella Grip III M.D.   On: 05/16/2016 12:44   Review of Systems  Constitutional: Positive for chills, diaphoresis and fever.  HENT: Negative.   Eyes: Negative.   Respiratory: Negative.   Cardiovascular: Negative.   Gastrointestinal: Positive for abdominal pain, constipation and nausea. Negative for vomiting.  Genitourinary: Negative.   Musculoskeletal: Negative.   Skin: Negative.   Neurological: Positive for weakness.  Endo/Heme/Allergies: Negative.   Psychiatric/Behavioral: Negative.    Blood pressure (!) 108/54, pulse 72, temperature 101.3 F (38.5 C), temperature source Oral, resp. rate 20, height 5' 8"  (1.727 m), weight 113.4 kg (250 lb), SpO2 94 %. Physical Exam  Constitutional: He is oriented to person, place, and time. He appears well-developed and well-nourished. No distress.  HENT:  Head: Normocephalic and atraumatic.  Mouth/Throat: Oropharynx is clear and moist.  Eyes: Conjunctivae are normal. Pupils are equal, round, and reactive to light. No  scleral icterus.  Neck: Normal range of motion. Neck supple. No thyromegaly present.  Cardiovascular: Normal rate, regular rhythm and normal Evan sounds.   No murmur heard. Respiratory: Effort normal and breath sounds normal. No respiratory distress. He has no wheezes.  GI: Soft. He exhibits distension. He exhibits no mass. There is no tenderness. There is no rebound and no guarding.  quiet  Musculoskeletal: Normal range of motion.  He exhibits edema (mild LE's).  Neurological: He is alert and oriented to person, place, and time.  Skin: Skin is warm and dry. He is not diaphoretic.  Psychiatric: He has a normal mood and affect. His behavior is normal.    Assessment/Plan: Pelvic abscess, likely diverticular in origin  Empiric abx per medical service  I will consult IR to evaluate for percutaneous drainage  Discussed with radiology - patient is on Plavix and ASA  Discussed possible need for operative intervention if medical Rx and drainage fail - possible need for colostomy - patient understands  Mililani Murthy M 05/16/2016, 4:32 PM

## 2016-05-16 NOTE — ED Triage Notes (Addendum)
Per EMS, Pt, from home, c/o lower abdominal pain and chills x 5 days and constipation x 10 days.  Pain score 8/10.  172mcg Fentanyl and 4mg  Zofran given en route.      Pt reports taking Miralax x 2 days w/o relief.  Pt reports "all I'll get is a little squirt."

## 2016-05-16 NOTE — Telephone Encounter (Signed)
Patient Name: OSTEN JACOX  DOB: 03-30-1946    Initial Comment Caller says a week ago husband had stomach pains due to constipation, now abd pains are so bad he can hardly walk    Nurse Assessment      Guidelines    Guideline Title Affirmed Question Affirmed Notes       Final Disposition User   Clinical Call [place], RN, Heather    Comments  Caller hung up from urgent, called caller back and she states that her husband started having difficulty breathing so she called 911 and the ambulance is on the way.. She wants to cancel his appt for today at the office. Appt cancelled per EP[facility].

## 2016-05-17 DIAGNOSIS — G4733 Obstructive sleep apnea (adult) (pediatric): Secondary | ICD-10-CM | POA: Diagnosis not present

## 2016-05-17 DIAGNOSIS — A419 Sepsis, unspecified organism: Secondary | ICD-10-CM | POA: Diagnosis not present

## 2016-05-17 DIAGNOSIS — I10 Essential (primary) hypertension: Secondary | ICD-10-CM | POA: Diagnosis not present

## 2016-05-17 DIAGNOSIS — K651 Peritoneal abscess: Secondary | ICD-10-CM | POA: Diagnosis not present

## 2016-05-17 DIAGNOSIS — E1165 Type 2 diabetes mellitus with hyperglycemia: Secondary | ICD-10-CM | POA: Diagnosis not present

## 2016-05-17 DIAGNOSIS — E118 Type 2 diabetes mellitus with unspecified complications: Secondary | ICD-10-CM | POA: Diagnosis not present

## 2016-05-17 LAB — BASIC METABOLIC PANEL
ANION GAP: 8 (ref 5–15)
BUN: 25 mg/dL — ABNORMAL HIGH (ref 6–20)
CO2: 20 mmol/L — ABNORMAL LOW (ref 22–32)
Calcium: 8.2 mg/dL — ABNORMAL LOW (ref 8.9–10.3)
Chloride: 104 mmol/L (ref 101–111)
Creatinine, Ser: 1.14 mg/dL (ref 0.61–1.24)
GLUCOSE: 175 mg/dL — AB (ref 65–99)
POTASSIUM: 4.2 mmol/L (ref 3.5–5.1)
SODIUM: 132 mmol/L — AB (ref 135–145)

## 2016-05-17 LAB — URINE CULTURE

## 2016-05-17 LAB — CBC
HEMATOCRIT: 32.3 % — AB (ref 39.0–52.0)
HEMOGLOBIN: 10.9 g/dL — AB (ref 13.0–17.0)
MCH: 31.8 pg (ref 26.0–34.0)
MCHC: 33.7 g/dL (ref 30.0–36.0)
MCV: 94.2 fL (ref 78.0–100.0)
Platelets: 203 10*3/uL (ref 150–400)
RBC: 3.43 MIL/uL — AB (ref 4.22–5.81)
RDW: 13.6 % (ref 11.5–15.5)
WBC: 8.9 10*3/uL (ref 4.0–10.5)

## 2016-05-17 LAB — GLUCOSE, CAPILLARY
GLUCOSE-CAPILLARY: 201 mg/dL — AB (ref 65–99)
Glucose-Capillary: 146 mg/dL — ABNORMAL HIGH (ref 65–99)
Glucose-Capillary: 123 mg/dL — ABNORMAL HIGH (ref 65–99)
Glucose-Capillary: 202 mg/dL — ABNORMAL HIGH (ref 65–99)

## 2016-05-17 MED ORDER — HYDROCODONE-ACETAMINOPHEN 5-325 MG PO TABS
2.0000 | ORAL_TABLET | Freq: Four times a day (QID) | ORAL | Status: DC
Start: 2016-05-17 — End: 2016-05-23
  Administered 2016-05-17 – 2016-05-22 (×22): 2 via ORAL
  Filled 2016-05-17 (×22): qty 2

## 2016-05-17 MED ORDER — MORPHINE SULFATE (PF) 2 MG/ML IV SOLN
2.0000 mg | INTRAVENOUS | Status: DC | PRN
  Administered 2016-05-17 – 2016-05-23 (×10): 2 mg via INTRAVENOUS
  Filled 2016-05-17 (×10): qty 1

## 2016-05-17 MED ORDER — HYDROCODONE-ACETAMINOPHEN 5-325 MG PO TABS: 2.0000 | ORAL_TABLET | Freq: Four times a day (QID) | ORAL | Status: DC

## 2016-05-17 MED ORDER — GUAIFENESIN 100 MG/5ML PO SOLN
5.0000 mL | ORAL | Status: DC | PRN
  Administered 2016-05-17: 100 mg via ORAL
  Filled 2016-05-17: qty 10

## 2016-05-17 MED ORDER — HYDROMORPHONE HCL 1 MG/ML IJ SOLN: 1.0000 mg | INTRAMUSCULAR | Status: DC | PRN

## 2016-05-17 MED ORDER — BOOST / RESOURCE BREEZE PO LIQD
1.0000 | Freq: Three times a day (TID) | ORAL | Status: DC
  Administered 2016-05-17 – 2016-05-20 (×4): 1 via ORAL

## 2016-05-17 NOTE — Progress Notes (Signed)
Patient ID: Evan Avery, male   DOB: [birth year], 70 y.o.   MRN: [id number]  [facility]  Subjective: Patient in bed, complains of pain.  Wife at bedside.  Objective: Vital signs in last 24 hours: Temp:  [98 F (36.7 C)-101.3 F (38.5 C)] 98 F (36.7 C) (07/26 0456) Pulse Rate:  [47-89] 66 (07/26 0955) Resp:  [18-20] 20 (07/26 0456) BP: (94-124)/(48-67) 117/62 (07/26 0955) SpO2:  [91 %-97 %] 97 % (07/26 0456) Weight:  [108.2 kg (238 lb 8.6 oz)-113.4 kg (250 lb)] 108.2 kg (238 lb 8.6 oz) (07/25 1706) Last BM Date: 05/06/16  Intake/Output from previous day: 07/25 0701 - 07/26 0700 In: 1510 [I.V.:460; IV Piggyback:1050] Out: 325 [Urine:325] Intake/Output this shift: Total I/O In: -  Out: 450 [Urine:450]  Physical Exam: HEENT - sclerae clear, mucous membranes moist Abdomen - moderate distension, active BS present; mild diffuse tenderness, worse in lower midline Ext - no edema, non-tender Neuro - alert & oriented, no focal deficits  Lab Results:   Recent Labs  05/16/16 1031 05/17/16 0538  WBC 5.1 8.9  HGB 13.4 10.9*  HCT 39.2 32.3*  PLT 220 203   BMET  Recent Labs  05/16/16 1031 05/17/16 0538  NA 133* 132*  K 4.3 4.2  CL 101 104  CO2 19* 20*  GLUCOSE 246* 175*  BUN 24* 25*  CREATININE 1.21 1.14  CALCIUM 9.4 8.2*   PT/INR  Recent Labs  05/16/16 1726  LABPROT 16.1*  INR 1.33   Comprehensive Metabolic Panel:    Component Value Date/Time   NA 132 (L) 05/17/2016 0538   NA 133 (L) 05/16/2016 1031   K 4.2 05/17/2016 0538   K 4.3 05/16/2016 1031   CL 104 05/17/2016 0538   CL 101 05/16/2016 1031   CO2 20 (L) 05/17/2016 0538   CO2 19 (L) 05/16/2016 1031   BUN 25 (H) 05/17/2016 0538   BUN 24 (H) 05/16/2016 1031   CREATININE 1.14 05/17/2016 0538   CREATININE 1.21 05/16/2016 1031   CREATININE 0.96 03/13/2016 0803   GLUCOSE 175 (H) 05/17/2016 0538   GLUCOSE 246 (H) 05/16/2016 1031   CALCIUM 8.2 (L) 05/17/2016  0538   CALCIUM 9.4 05/16/2016 1031   AST 20 05/16/2016 1031   AST 17 03/13/2016 0803   ALT 27 05/16/2016 1031   ALT 24 03/13/2016 0803   ALKPHOS 58 05/16/2016 1031   ALKPHOS 58 03/13/2016 0803   BILITOT 1.3 (H) 05/16/2016 1031   BILITOT 0.4 03/13/2016 0803   PROT 8.0 05/16/2016 1031   PROT 6.3 03/13/2016 0803   ALBUMIN 4.0 05/16/2016 1031   ALBUMIN 4.1 03/13/2016 0803    Studies/Results: Ct Abdomen Pelvis W Contrast  Result Date: 05/16/2016 CLINICAL DATA:  70 year old male with abdominal and pelvic pain and chills for 5 days. EXAM: CT ABDOMEN AND PELVIS WITH CONTRAST TECHNIQUE: Multidetector CT imaging of the abdomen and pelvis was performed using the standard protocol following bolus administration of intravenous contrast. CONTRAST:  145mL ISOVUE-300 IOPAMIDOL (ISOVUE-300) INJECTION 61% COMPARISON:  05/16/2016 and prior radiographs FINDINGS: Lower chest: Cardiomegaly and moderate to heavy coronary artery calcifications noted. Hepatobiliary: The liver is unremarkable. A 1.1 cm calcified gallstone is noted. There is no CT evidence of acute cholecystitis. No biliary dilatation identified. Pancreas: Unremarkable Spleen: Unremarkable Adrenals/Urinary Tract: Mild bilateral renal atrophy noted with small bilateral renal cysts. A 2 x 4 cm left adrenal mass has a relative washout of 40%, consistent with an adenoma. The right adrenal  gland and bladder are unremarkable. Stomach/Bowel: Moderate inflammation within the mid pelvis is noted with a 5 cm ill-defined collection/developing abscess (image 65). A tiny focus of extraluminal gas is noted (image 62). This inflammation and collection lies immediately adjacent to small bowel and a portion of the sigmoid colon. Colonic diverticulosis is noted. There is upper limits of normal caliber proximal small bowel likely representing ileus. Vascular/Lymphatic: Aortic atherosclerotic calcifications noted without aneurysm. No enlarged lymph nodes identified.  Reproductive: Unremarkable Other: No free fluid noted. Musculoskeletal: No acute or suspicious abnormality. IMPRESSION: 5 cm collection/ developing abscess within the mid pelvis with adjacent inflammation and tiny focus of intraluminal gas. This compatible with focal bowel perforation, but difficult to determine origin from small bowel or the sigmoid colon (more likely). Small amount of free fluid within the abdomen and pelvis. Cholelithiasis without CT evidence of acute cholecystitis. Left adrenal adenoma. Abdominal aortic atherosclerosis. These results were discussed with Dr. Thurnell Garbe on 05/16/2016 at 1:50 p.m. Electronically Signed   By: Margarette Canada M.D.   On: 05/16/2016 13:52  Dg Abd Acute W/chest  Result Date: 05/16/2016 CLINICAL DATA:  Constipation with lower abdominal pain EXAM: DG ABDOMEN ACUTE W/ 1V CHEST COMPARISON:  Chest radiograph October 11, 2011 FINDINGS: PA chest: Lungs are clear. Heart size and pulmonary vascularity are normal. No adenopathy. There are foci of atherosclerotic calcification in each carotid artery. Supine and upright abdomen: There is moderate stool throughout colon. There is no appreciable bowel dilatation. There are scattered air-fluid levels in the abdomen, however. No free air is evident. There is a small phlebolith in the pelvis. IMPRESSION: Scattered air-fluid levels without bowel dilatation. Question early ileus or enteritis. Bowel obstruction is felt to be less likely. No free air. Moderate stool throughout colon. No lung edema or consolidation. There are foci of carotid artery calcification bilaterally. Electronically Signed   By: Lowella Grip III M.D.   On: 05/16/2016 12:44   Assessment & Plans: Pelvic abscess, likely diverticular in origin  Empiric abx per medical service - Zosyn  IR unable to find window for drainage - repeat CT scan 2-3 days  Again discussed possible need for operative intervention if medical Rx and drainage fail - possible need for  colostomy  - patient and wife understand  Discontinued ASA at this time  May consider cardiology eval - Dr. Ellouise Newer is patient's cardiologist  Changed pain Rx to dilaudid IV prn  Will follow with you    Earnstine Regal, MD, [facility] Office: [place] 05/17/2016

## 2016-05-17 NOTE — Care Management Note (Signed)
Case Management Note  Patient Details  Name: Evan Avery MRN: [id number] Date of Birth: May 28, 1946  Subjective/Objective:  70 y/o m admitted w/intra abdominal abscess. From home.                  Action/Plan:d/c plan home.   Expected Discharge Date:   (unknown)               Expected Discharge Plan:  Home/Self Care  In-House Referral:     Discharge planning Services  CM Consult  Post Acute Care Choice:    Choice offered to:     DME Arranged:    DME Agency:     HH Arranged:    HH Agency:     Status of Service:  In process, will continue to follow  If discussed at Long Length of Stay Meetings, dates discussed:    Additional Comments:  Dessa Phi, RN 05/17/2016, 12:29 PM

## 2016-05-17 NOTE — Progress Notes (Signed)
Initial Nutrition Assessment  DOCUMENTATION CODES:   Obesity unspecified  INTERVENTION:  - Continue CLD and advance diet as medically feasible. - Will order Boost Breeze TID, each supplement provides 250 kcal and 9 grams of protein - RD will continue to monitor for additional nutrition-related needs.  NUTRITION DIAGNOSIS:   Inadequate protein intake related to other (see comment) (current diet order) as evidenced by other (see comment) (CLD does not meet estimated protein needs.).  GOAL:   Patient will meet greater than or equal to 90% of their needs  MONITOR:   PO intake, Supplement acceptance, Diet advancement, Weight trends, Labs, I & O's  REASON FOR ASSESSMENT:   Malnutrition Screening Tool  ASSESSMENT:    70 y.o. male with medical history significant of CAD with a stent in 1992, obesity and OSA came to the hospital complaining about abdominal pain. His symptoms started about 10 days ago, he also had obstipation and tried multiple OTC laxatives without success. About 45 days ago started to have chills and he came in this morning because of the pain and chills worsens. CT scan showed intra-abdominal abscess.  Pt seen for MST. BMI indicates obesity. Diet advanced from NPO to CLD prior to lunch; at time of RD visit pt had consumed 100% of [place] ice and chicken broth and was sipping on soda. Pt reports he is still experiencing abdominal pain but denies lunch intake exacerbating pain. He states that pain is better controlled since hospitalization and that PTA it had been occurring for 10-12 days and was severe, stabbing in nature initially and then was combined with abdominal cramping.  Pt states that during 10-12 period he has also experienced constipation. He reports only 2 instances of nausea during this time frame, no emesis with nausea. Pt reports that he was "drinking gallons of water each day" PTA and solid foods were limited to chicken noodle soup and [place] sausage on a  hotdog but with condiments; asked pt several times if intakes of these items exacerbated abdominal pain but unable to obtain a clear answer at this time.   Per notes, CT abd showed 5 cm upper pelvic abscess; unable to be drained due to location. Plan to repeat CT in 2-3 and provide antibiotics. Will monitor for ability for diet advancement. Will order Boost Breeze on CLD to supplement protein.  Physical assessment shows no muscle or fat wasting at this time. Pt unsure of weight trend during 10-12 days PTA. Per chart review, pt has lost 9 lbs (3.6% body weight) in the past 2 months which is not significant for time frame.   Medications reviewed; sliding scale Novolog, PRN Zofran, 3.375 g IV Zosyn every 8 hours. Labs reviewed; CBG: 148 mg/dL today, Na: 132 mmol/L, BUN: 25 mg/dL, Ca: 8.2 mg/dL. IVF: NS @ 75 mL/hr.    Diet Order:  Diet clear liquid Room service appropriate? Yes; Fluid consistency: Thin  Skin:  Reviewed, no issues  Last BM:  PTA  Height:   Ht Readings from Last 1 Encounters:  05/16/16 5\' 8"  (1.727 m)    Weight:   Wt Readings from Last 1 Encounters:  05/16/16 238 lb 8.6 oz (108.2 kg)    Ideal Body Weight:  70 kg (kg)  BMI:  Body mass index is 36.27 kg/m.  Estimated Nutritional Needs:   Kcal:  1700-1900  Protein:  85-95 grams  Fluid:  >/=2 L/day  EDUCATION NEEDS:   No education needs identified at this time    Jarome Matin, MS, RD,  LDN Inpatient Clinical Dietitian Pager # [phone] After hours/weekend pager # [phone]

## 2016-05-17 NOTE — Progress Notes (Signed)
Patient ID: Evan Avery, male   DOB: July 15, 1946, 70 y.o.   MRN: [id number] Request received for CT guided drainage of pelvic abscess on patient. History noted, labs/imaging reviewed by Dr. Laurence Ferrari. Patient currently afebrile, WBC 8.9, lactic acid 1.6. Patient on Plavix as outpatient which has been held since admission yesterday. The pelvic fluid collection/abscess is deep within the mesentery and currently not accessible by percutaneous approach. Would recommend follow-up CT in 2-3 days and continued IV antibiotic therapy. At that time we can reassess collection for accessibility and/or possible drainage if needed. In addition this would also allow holiday from Plavix therapy and minimize potential bleeding risk associated with drain placement.

## 2016-05-17 NOTE — Progress Notes (Addendum)
PROGRESS NOTE    Evan Avery  [id number] DOB: [birth year] DOA: 05/16/2016  PCP: [name], MD   Brief Narrative:  70 y/o with h/o CAD and 4 stents, OSA on CPAP, DM, HTN, HLD who presents for abdominal pain & constipation. Found to have an intra-abdominal abscess on CT scan.   Subjective: Pain much better with pain medications. No nausea. Tolerating clears. States oral pain meds last longer but he get episodes of acute pain for which Morphine has been helping. He can now walk without feeling pain.   Assessment & Plan:   Principal Problem:   Intra-abdominal abscess - Sepsis  - Zosyn - IR unable to place drain as abscess is quite deep -  plan is to continue IV antibiotics with hopes to avoid surgery - cont clears - change Vicodin to QID and cont Morphine PRN - ASA and plavix held due to possible need for surgery  Active Problems: CAD with stents - 4 stents in RCA - last stent placed in 2009 - last nuclear stress test in 8/12 was negative for reversible ischemia    Diabetes mellitus type 2, uncontrolled - ISS- hold Metformin    OSA on CPAP - Cont CPAP     Essential hypertension - Atenolol- lisinopril on hold    Obesity, Class II, BMI 35-39.9, with comorbidity  Body mass index is 36.27 kg/m.    DVT prophylaxis: heparin Code Status: full code Family Communication:  Disposition Plan: home when stable Consultants:   gen surgery  IR Procedures:    Antimicrobials:  Anti-infectives    Start     Dose/Rate Route Frequency Ordered Stop   05/16/16 2200  piperacillin-tazobactam (ZOSYN) IVPB 3.375 g     3.375 g 12.5 mL/hr over 240 Minutes Intravenous Every 8 hours 05/16/16 1710     05/16/16 1500  piperacillin-tazobactam (ZOSYN) IVPB 3.375 g     3.375 g 100 mL/hr over 30 Minutes Intravenous  Once 05/16/16 1435 05/16/16 1613   05/16/16 1430  piperacillin-tazobactam (ZOSYN) IVPB 4.5 g  Status:  Discontinued     4.5 g 200 mL/hr over 30 Minutes Intravenous   Once 05/16/16 1357 05/16/16 1435       Objective: Vitals:   05/16/16 2150 05/17/16 0456 05/17/16 0955 05/17/16 1445  BP: (!) 94/48 119/67 117/62 108/71  Pulse: 62 64 66 73  Resp: 20 20  20   Temp: 98.2 F (36.8 C) 98 F (36.7 C)  98.3 F (36.8 C)  TempSrc: Oral Oral  Oral  SpO2: 95% 97%  93%  Weight:      Height:        Intake/Output Summary (Last 24 hours) at 05/17/16 1517 Last data filed at 05/17/16 1300  Gross per 24 hour  Intake          2903.75 ml  Output             1075 ml  Net          1828.75 ml   Filed Weights   05/16/16 1626 05/16/16 1706  Weight: 113.4 kg (250 lb) 108.2 kg (238 lb 8.6 oz)    Examination: General exam: Appears comfortable  HEENT: PERRLA, oral mucosa moist, no sclera icterus or thrush Respiratory system: Clear to auscultation. Respiratory effort normal. Cardiovascular system: S1 & S2 heard, RRR.  No murmurs  Gastrointestinal system: Abdomen soft, tender across lower abdomen, nondistended. Normal bowel sound. No organomegaly Central nervous system: Alert and oriented. No focal neurological deficits. Extremities: No cyanosis, clubbing or  edema Skin: No rashes or ulcers Psychiatry:  Mood & affect appropriate.     Data Reviewed: I have personally reviewed following labs and imaging studies  CBC:  Recent Labs Lab 05/16/16 1031 05/17/16 0538  WBC 5.1 8.9  HGB 13.4 10.9*  HCT 39.2 32.3*  MCV 93.6 94.2  PLT 220 123456   Basic Metabolic Panel:  Recent Labs Lab 05/16/16 1031 05/17/16 0538  NA 133* 132*  K 4.3 4.2  CL 101 104  CO2 19* 20*  GLUCOSE 246* 175*  BUN 24* 25*  CREATININE 1.21 1.14  CALCIUM 9.4 8.2*   GFR: Estimated Creatinine Clearance: 71.9 mL/min (by C-G formula based on SCr of 1.14 mg/dL). Liver Function Tests:  Recent Labs Lab 05/16/16 1031  AST 20  ALT 27  ALKPHOS 58  BILITOT 1.3*  PROT 8.0  ALBUMIN 4.0    Recent Labs Lab 05/16/16 1031  LIPASE 11   No results for input(s): AMMONIA in the last  168 hours. Coagulation Profile:  Recent Labs Lab 05/16/16 1726  INR 1.33   Cardiac Enzymes: No results for input(s): CKTOTAL, CKMB, CKMBINDEX, TROPONINI in the last 168 hours. BNP (last 3 results) No results for input(s): PROBNP in the last 8760 hours. HbA1C: No results for input(s): HGBA1C in the last 72 hours. CBG:  Recent Labs Lab 05/16/16 1809 05/16/16 2155 05/17/16 0834 05/17/16 1200  GLUCAP 238* 180* 146* 123*   Lipid Profile: No results for input(s): CHOL, HDL, LDLCALC, TRIG, CHOLHDL, LDLDIRECT in the last 72 hours. Thyroid Function Tests:  Recent Labs  05/16/16 1726  TSH 1.113   Anemia Panel: No results for input(s): VITAMINB12, FOLATE, FERRITIN, TIBC, IRON, RETICCTPCT in the last 72 hours. Urine analysis:    Component Value Date/Time   COLORURINE ORANGE (A) 05/16/2016 1223   APPEARANCEUR CLOUDY (A) 05/16/2016 1223   LABSPEC 1.034 (H) 05/16/2016 1223   PHURINE 5.5 05/16/2016 1223   GLUCOSEU >1000 (A) 05/16/2016 1223   HGBUR NEGATIVE 05/16/2016 1223   HGBUR negative 05/17/2007 0835   BILIRUBINUR SMALL (A) 05/16/2016 1223   BILIRUBINUR n 02/28/2011   KETONESUR 15 (A) 05/16/2016 1223   PROTEINUR 30 (A) 05/16/2016 1223   UROBILINOGEN 1.0 10/11/2011 0215   NITRITE POSITIVE (A) 05/16/2016 1223   LEUKOCYTESUR SMALL (A) 05/16/2016 1223   Sepsis Labs: @LABRCNTIP (procalcitonin:4,lacticidven:4) ) Recent Results (from the past 240 hour(s))  Urine culture     Status: Abnormal   Collection Time: 05/16/16 12:23 PM  Result Value Ref Range Status   Specimen Description URINE, CLEAN CATCH  Final   Special Requests NONE  Final   Culture MULTIPLE SPECIES PRESENT, SUGGEST RECOLLECTION (A)  Final   Report Status 05/17/2016 FINAL  Final         Radiology Studies: Ct Abdomen Pelvis W Contrast  Result Date: 05/16/2016 CLINICAL DATA:  70 year old male with abdominal and pelvic pain and chills for 5 days. EXAM: CT ABDOMEN AND PELVIS WITH CONTRAST TECHNIQUE:  Multidetector CT imaging of the abdomen and pelvis was performed using the standard protocol following bolus administration of intravenous contrast. CONTRAST:  129mL ISOVUE-300 IOPAMIDOL (ISOVUE-300) INJECTION 61% COMPARISON:  05/16/2016 and prior radiographs FINDINGS: Lower chest: Cardiomegaly and moderate to heavy coronary artery calcifications noted. Hepatobiliary: The liver is unremarkable. A 1.1 cm calcified gallstone is noted. There is no CT evidence of acute cholecystitis. No biliary dilatation identified. Pancreas: Unremarkable Spleen: Unremarkable Adrenals/Urinary Tract: Mild bilateral renal atrophy noted with small bilateral renal cysts. A 2 x 4 cm left adrenal mass has  a relative washout of 40%, consistent with an adenoma. The right adrenal gland and bladder are unremarkable. Stomach/Bowel: Moderate inflammation within the mid pelvis is noted with a 5 cm ill-defined collection/developing abscess (image 65). A tiny focus of extraluminal gas is noted (image 62). This inflammation and collection lies immediately adjacent to small bowel and a portion of the sigmoid colon. Colonic diverticulosis is noted. There is upper limits of normal caliber proximal small bowel likely representing ileus. Vascular/Lymphatic: Aortic atherosclerotic calcifications noted without aneurysm. No enlarged lymph nodes identified. Reproductive: Unremarkable Other: No free fluid noted. Musculoskeletal: No acute or suspicious abnormality. IMPRESSION: 5 cm collection/ developing abscess within the mid pelvis with adjacent inflammation and tiny focus of intraluminal gas. This compatible with focal bowel perforation, but difficult to determine origin from small bowel or the sigmoid colon (more likely). Small amount of free fluid within the abdomen and pelvis. Cholelithiasis without CT evidence of acute cholecystitis. Left adrenal adenoma. Abdominal aortic atherosclerosis. These results were discussed with Dr. Thurnell Garbe on 05/16/2016 at 1:50  p.m. Electronically Signed   By: Margarette Canada M.D.   On: 05/16/2016 13:52  Dg Abd Acute W/chest  Result Date: 05/16/2016 CLINICAL DATA:  Constipation with lower abdominal pain EXAM: DG ABDOMEN ACUTE W/ 1V CHEST COMPARISON:  Chest radiograph October 11, 2011 FINDINGS: PA chest: Lungs are clear. Heart size and pulmonary vascularity are normal. No adenopathy. There are foci of atherosclerotic calcification in each carotid artery. Supine and upright abdomen: There is moderate stool throughout colon. There is no appreciable bowel dilatation. There are scattered air-fluid levels in the abdomen, however. No free air is evident. There is a small phlebolith in the pelvis. IMPRESSION: Scattered air-fluid levels without bowel dilatation. Question early ileus or enteritis. Bowel obstruction is felt to be less likely. No free air. Moderate stool throughout colon. No lung edema or consolidation. There are foci of carotid artery calcification bilaterally. Electronically Signed   By: Lowella Grip III M.D.   On: 05/16/2016 12:44     Scheduled Meds: . atenolol  50 mg Oral Daily  . feeding supplement  1 Container Oral TID BM  . heparin  5,000 Units Subcutaneous Q8H  . HYDROcodone-acetaminophen  2 tablet Oral QID  . insulin aspart  0-9 Units Subcutaneous TID WC  . piperacillin-tazobactam (ZOSYN)  IV  3.375 g Intravenous Q8H  . sodium chloride flush  3 mL Intravenous Q12H   Continuous Infusions: . sodium chloride 75 mL/hr at 05/17/16 1140     LOS: 1 day    Time spent in minutes: 46    [place], MD [facility] Pager: [email or web address] Password [email or web address] 05/17/2016, 3:17 PM

## 2016-05-17 NOTE — Progress Notes (Signed)
Pt has home CPAP from home and will self administer when ready for bed.  Pt to notify RT if any assistance is required throughout the night.  RT to monitor and assess as needed.

## 2016-05-18 DIAGNOSIS — G4733 Obstructive sleep apnea (adult) (pediatric): Secondary | ICD-10-CM | POA: Diagnosis not present

## 2016-05-18 DIAGNOSIS — A419 Sepsis, unspecified organism: Secondary | ICD-10-CM | POA: Diagnosis not present

## 2016-05-18 DIAGNOSIS — E1165 Type 2 diabetes mellitus with hyperglycemia: Secondary | ICD-10-CM | POA: Diagnosis not present

## 2016-05-18 DIAGNOSIS — E118 Type 2 diabetes mellitus with unspecified complications: Secondary | ICD-10-CM | POA: Diagnosis not present

## 2016-05-18 DIAGNOSIS — K651 Peritoneal abscess: Secondary | ICD-10-CM | POA: Diagnosis not present

## 2016-05-18 DIAGNOSIS — I10 Essential (primary) hypertension: Secondary | ICD-10-CM | POA: Diagnosis not present

## 2016-05-18 LAB — BASIC METABOLIC PANEL
ANION GAP: 10 (ref 5–15)
BUN: 18 mg/dL (ref 6–20)
CALCIUM: 8.4 mg/dL — AB (ref 8.9–10.3)
CO2: 22 mmol/L (ref 22–32)
CREATININE: 1.09 mg/dL (ref 0.61–1.24)
Chloride: 102 mmol/L (ref 101–111)
GFR calc non Af Amer: >60 mL/min (ref >60)
Glucose, Bld: 154 mg/dL — ABNORMAL HIGH (ref 65–99)
Potassium: 3.8 mmol/L (ref 3.5–5.1)
SODIUM: 134 mmol/L — AB (ref 135–145)

## 2016-05-18 LAB — GLUCOSE, CAPILLARY
GLUCOSE-CAPILLARY: 168 mg/dL — AB (ref 65–99)
GLUCOSE-CAPILLARY: 171 mg/dL — AB (ref 65–99)
GLUCOSE-CAPILLARY: 161 mg/dL — AB (ref 65–99)
Glucose-Capillary: 171 mg/dL — ABNORMAL HIGH (ref 65–99)

## 2016-05-18 LAB — HEMOGLOBIN A1C
HEMOGLOBIN A1C: 7.2 % — AB (ref 4.8–5.6)
Mean Plasma Glucose: 160 mg/dL

## 2016-05-18 MED ORDER — BENZONATATE 100 MG PO CAPS
200.0000 mg | ORAL_CAPSULE | Freq: Three times a day (TID) | ORAL | Status: DC | PRN
  Administered 2016-05-18: 200 mg via ORAL
  Filled 2016-05-18: qty 2

## 2016-05-18 NOTE — Progress Notes (Signed)
PROGRESS NOTE    Evan Avery  [id number] DOB: May 16, 1946 DOA: 05/16/2016  PCP: [name], MD   Brief Narrative:  70 y/o with h/o CAD and 4 stents, OSA on CPAP, DM, HTN, HLD who presents for abdominal pain & constipation. Found to have an intra-abdominal abscess on CT scan.   Subjective: Pain controlled except when he coughs. Has chronic issues with cough. No sputum. No BM. Urinating well.   Assessment & Plan:   Principal Problem:   Intra-abdominal abscess - Sepsis  - Zosyn - IR unable to place drain as abscess is quite deep -  plan is to continue IV antibiotics with hopes to avoid surgery - cont clears - changed Vicodin to QID and cont Morphine PRN - ASA and plavix held due to possible need for surgery  Active Problems: CAD with stents - 4 stents in RCA - last stent placed in 2009 - last nuclear stress test in 8/12 was negative for reversible ischemia  Cough - clear lungs- not hyopoxic - Tessalon Perles     Diabetes mellitus type 2, uncontrolled - ISS- hold Metformin    OSA on CPAP - Cont CPAP     Essential hypertension - Atenolol- lisinopril on hold    Obesity, Class II, BMI 35-39.9, with comorbidity  Body mass index is 36.27 kg/m.    DVT prophylaxis: heparin Code Status: full code Family Communication:  Disposition Plan: home when stable Consultants:   gen surgery  IR Procedures:    Antimicrobials:  Anti-infectives    Start     Dose/Rate Route Frequency Ordered Stop   05/16/16 2200  piperacillin-tazobactam (ZOSYN) IVPB 3.375 g     3.375 g 12.5 mL/hr over 240 Minutes Intravenous Every 8 hours 05/16/16 1710     05/16/16 1500  piperacillin-tazobactam (ZOSYN) IVPB 3.375 g     3.375 g 100 mL/hr over 30 Minutes Intravenous  Once 05/16/16 1435 05/16/16 1613   05/16/16 1430  piperacillin-tazobactam (ZOSYN) IVPB 4.5 g  Status:  Discontinued     4.5 g 200 mL/hr over 30 Minutes Intravenous  Once 05/16/16 1357 05/16/16 1435        Objective: Vitals:   05/17/16 0955 05/17/16 1445 05/17/16 2107 05/18/16 0520  BP: 117/62 108/71 130/65 (!) 130/91  Pulse: 66 73 74 73  Resp:  20 20 18   Temp:  98.3 F (36.8 C) (!) 100.4 F (38 C) 98.2 F (36.8 C)  TempSrc:  Oral Oral Oral  SpO2:  93% 91% 95%  Weight:      Height:        Intake/Output Summary (Last 24 hours) at 05/18/16 1357 Last data filed at 05/18/16 0616  Gross per 24 hour  Intake             2095 ml  Output             2100 ml  Net               -5 ml   Filed Weights   05/16/16 1626 05/16/16 1706  Weight: 113.4 kg (250 lb) 108.2 kg (238 lb 8.6 oz)    Examination: General exam: Appears comfortable  HEENT: PERRLA, oral mucosa moist, no sclera icterus or thrush Respiratory system: Clear to auscultation. Respiratory effort normal. Cardiovascular system: S1 & S2 heard, RRR.  No murmurs  Gastrointestinal system: Abdomen soft, tender across lower abdomen, nondistended. Normal bowel sound. No organomegaly Central nervous system: Alert and oriented. No focal neurological deficits. Extremities: No cyanosis, clubbing  or edema Skin: No rashes or ulcers Psychiatry:  Mood & affect appropriate.     Data Reviewed: I have personally reviewed following labs and imaging studies  CBC:  Recent Labs Lab 05/16/16 1031 05/17/16 0538  WBC 5.1 8.9  HGB 13.4 10.9*  HCT 39.2 32.3*  MCV 93.6 94.2  PLT 220 123456   Basic Metabolic Panel:  Recent Labs Lab 05/16/16 1031 05/17/16 0538 05/18/16 0500  NA 133* 132* 134*  K 4.3 4.2 3.8  CL 101 104 102  CO2 19* 20* 22  GLUCOSE 246* 175* 154*  BUN 24* 25* 18  CREATININE 1.21 1.14 1.09  CALCIUM 9.4 8.2* 8.4*   GFR: Estimated Creatinine Clearance: 75.2 mL/min (by C-G formula based on SCr of 1.09 mg/dL). Liver Function Tests:  Recent Labs Lab 05/16/16 1031  AST 20  ALT 27  ALKPHOS 58  BILITOT 1.3*  PROT 8.0  ALBUMIN 4.0    Recent Labs Lab 05/16/16 1031  LIPASE 11   No results for input(s):  AMMONIA in the last 168 hours. Coagulation Profile:  Recent Labs Lab 05/16/16 1726  INR 1.33   Cardiac Enzymes: No results for input(s): CKTOTAL, CKMB, CKMBINDEX, TROPONINI in the last 168 hours. BNP (last 3 results) No results for input(s): PROBNP in the last 8760 hours. HbA1C:  Recent Labs  05/16/16 1726  HGBA1C 7.2*   CBG:  Recent Labs Lab 05/17/16 1200 05/17/16 1638 05/17/16 2105 05/18/16 0833 05/18/16 1207  GLUCAP 123* 201* 202* 168* 171*   Lipid Profile: No results for input(s): CHOL, HDL, LDLCALC, TRIG, CHOLHDL, LDLDIRECT in the last 72 hours. Thyroid Function Tests:  Recent Labs  05/16/16 1726  TSH 1.113   Anemia Panel: No results for input(s): VITAMINB12, FOLATE, FERRITIN, TIBC, IRON, RETICCTPCT in the last 72 hours. Urine analysis:    Component Value Date/Time   COLORURINE ORANGE (A) 05/16/2016 1223   APPEARANCEUR CLOUDY (A) 05/16/2016 1223   LABSPEC 1.034 (H) 05/16/2016 1223   PHURINE 5.5 05/16/2016 1223   GLUCOSEU >1000 (A) 05/16/2016 1223   HGBUR NEGATIVE 05/16/2016 1223   HGBUR negative 05/17/2007 0835   BILIRUBINUR SMALL (A) 05/16/2016 1223   BILIRUBINUR n 02/28/2011   KETONESUR 15 (A) 05/16/2016 1223   PROTEINUR 30 (A) 05/16/2016 1223   UROBILINOGEN 1.0 10/11/2011 0215   NITRITE POSITIVE (A) 05/16/2016 1223   LEUKOCYTESUR SMALL (A) 05/16/2016 1223   Sepsis Labs: @LABRCNTIP (procalcitonin:4,lacticidven:4) ) Recent Results (from the past 240 hour(s))  Urine culture     Status: Abnormal   Collection Time: 05/16/16 12:23 PM  Result Value Ref Range Status   Specimen Description URINE, CLEAN CATCH  Final   Special Requests NONE  Final   Culture MULTIPLE SPECIES PRESENT, SUGGEST RECOLLECTION (A)  Final   Report Status 05/17/2016 FINAL  Final  Culture, blood (Routine X 2) w Reflex to ID Panel     Status: None (Preliminary result)   Collection Time: 05/16/16  5:25 PM  Result Value Ref Range Status   Specimen Description BLOOD RIGHT ARM   Final   Special Requests BOTTLES DRAWN AEROBIC AND ANAEROBIC  10CC  Final   Culture   Final    NO GROWTH < 24 HOURS Performed at [facility]    Report Status PENDING  Incomplete  Culture, blood (Routine X 2) w Reflex to ID Panel     Status: None (Preliminary result)   Collection Time: 05/16/16  5:30 PM  Result Value Ref Range Status   Specimen Description BLOOD LEFT HAND  Final   Special Requests BOTTLES DRAWN AEROBIC AND ANAEROBIC  10CC  Final   Culture   Final    NO GROWTH < 24 HOURS Performed at [facility]    Report Status PENDING  Incomplete         Radiology Studies: No results found.    Scheduled Meds: . atenolol  50 mg Oral Daily  . feeding supplement  1 Container Oral TID BM  . heparin  5,000 Units Subcutaneous Q8H  . HYDROcodone-acetaminophen  2 tablet Oral QID  . insulin aspart  0-9 Units Subcutaneous TID WC  . piperacillin-tazobactam (ZOSYN)  IV  3.375 g Intravenous Q8H  . sodium chloride flush  3 mL Intravenous Q12H   Continuous Infusions: . sodium chloride 75 mL/hr at 05/18/16 0230     LOS: 2 days    Time spent in minutes: 84    [place], MD [facility] Pager: [email or web address] Password [facility] 05/18/2016, 1:57 PM

## 2016-05-19 DIAGNOSIS — E118 Type 2 diabetes mellitus with unspecified complications: Secondary | ICD-10-CM | POA: Diagnosis not present

## 2016-05-19 DIAGNOSIS — K651 Peritoneal abscess: Secondary | ICD-10-CM | POA: Diagnosis not present

## 2016-05-19 DIAGNOSIS — A419 Sepsis, unspecified organism: Secondary | ICD-10-CM | POA: Diagnosis not present

## 2016-05-19 DIAGNOSIS — E1165 Type 2 diabetes mellitus with hyperglycemia: Secondary | ICD-10-CM | POA: Diagnosis not present

## 2016-05-19 DIAGNOSIS — G4733 Obstructive sleep apnea (adult) (pediatric): Secondary | ICD-10-CM | POA: Diagnosis not present

## 2016-05-19 DIAGNOSIS — I10 Essential (primary) hypertension: Secondary | ICD-10-CM | POA: Diagnosis not present

## 2016-05-19 LAB — GLUCOSE, CAPILLARY
GLUCOSE-CAPILLARY: 185 mg/dL — AB (ref 65–99)
Glucose-Capillary: 143 mg/dL — ABNORMAL HIGH (ref 65–99)
Glucose-Capillary: 272 mg/dL — ABNORMAL HIGH (ref 65–99)
Glucose-Capillary: 155 mg/dL — ABNORMAL HIGH (ref 65–99)

## 2016-05-19 MED ORDER — POLYETHYLENE GLYCOL 3350 17 G PO PACK
17.0000 g | PACK | Freq: Every day | ORAL | Status: DC | PRN
  Administered 2016-05-19: 17 g via ORAL
  Filled 2016-05-19: qty 1

## 2016-05-19 MED ORDER — SIMETHICONE 80 MG PO CHEW
80.0000 mg | CHEWABLE_TABLET | Freq: Four times a day (QID) | ORAL | Status: DC
  Administered 2016-05-20 – 2016-05-22 (×8): 80 mg via ORAL
  Filled 2016-05-19 (×12): qty 1

## 2016-05-19 NOTE — Progress Notes (Signed)
PHARMACY NOTE   Pharmacy has been assisting with dosing of Zosyn for intra-abdominal abscess. Dosage remains stable at 3.375g IV q8h (each dose infused over 4 hours) and need for further dosage adjustment appears unlikely at present.    Will sign off at this time.  Please reconsult if a change in clinical status warrants re-evaluation of dosage.   Lindell Spar, PharmD, BCPS Pager: [phone] 05/19/2016 10:33 AM

## 2016-05-19 NOTE — Progress Notes (Signed)
PROGRESS NOTE    Evan Avery  [id number] DOB: 15-Apr-1946 DOA: 05/16/2016  PCP: [name], MD   Brief Narrative:  70 y/o with h/o CAD and 4 stents, OSA on CPAP, DM, HTN, HLD who presents for abdominal pain & constipation. Found to have an intra-abdominal abscess on CT scan.   Subjective: Abdomen is more distended today. Still passing gas. He is not able to take deep breaths. Abdominal pain is controlled.   Assessment & Plan:   Principal Problem:   Intra-abdominal abscess - Sepsis  - Zosyn - IR unable to place drain as abscess is quite deep -  plan is to continue IV antibiotics with hopes to avoid surgery - cont clears - changed Vicodin to QID and cont Morphine PRN - ASA and plavix held due to possible need for surgery - abdomen distended today but audible bowel sounds- no BM in 2 wks- may need a dulcolax suppository- defer to gen surgery - start Simethicone  Active Problems: CAD with stents - 4 stents in RCA - last stent placed in 2009 - last nuclear stress test in 8/12 was negative for reversible ischemia - holding ASA and Plavix for now until it is decided that he will not need abdominal surgery -no recent chest pain/ discomfort  -  we do not need to consult cardiology at this time unless cardiac clearance is requested for surgery  Cough - clear lungs- not hyopoxic - Tessalon Perles     Diabetes mellitus type 2, uncontrolled - ISS- hold Metformin    OSA on CPAP - Cont CPAP     Essential hypertension - Atenolol- lisinopril on hold    Obesity, Class II, BMI 35-39.9, with comorbidity  Body mass index is 36.27 kg/m.    DVT prophylaxis: heparin Code Status: full code Family Communication:  Disposition Plan: home when stable Consultants:   gen surgery  IR Procedures:    Antimicrobials:  Anti-infectives    Start     Dose/Rate Route Frequency Ordered Stop   05/16/16 2200  piperacillin-tazobactam (ZOSYN) IVPB 3.375 g     3.375 g 12.5 mL/hr  over 240 Minutes Intravenous Every 8 hours 05/16/16 1710     05/16/16 1500  piperacillin-tazobactam (ZOSYN) IVPB 3.375 g     3.375 g 100 mL/hr over 30 Minutes Intravenous  Once 05/16/16 1435 05/16/16 1613   05/16/16 1430  piperacillin-tazobactam (ZOSYN) IVPB 4.5 g  Status:  Discontinued     4.5 g 200 mL/hr over 30 Minutes Intravenous  Once 05/16/16 1357 05/16/16 1435       Objective: Vitals:   05/18/16 1949 05/18/16 2104 05/19/16 0504 05/19/16 1350  BP:  (!) 144/65 137/72 115/60  Pulse: 62 62 64 (!) 52  Resp: 18 20 20 18   Temp:  98.4 F (36.9 C) 98.7 F (37.1 C) 98.8 F (37.1 C)  TempSrc:  Oral Oral Oral  SpO2: 96% 96% 97% 94%  Weight:      Height:        Intake/Output Summary (Last 24 hours) at 05/19/16 1400 Last data filed at 05/19/16 1350  Gross per 24 hour  Intake             1435 ml  Output             2575 ml  Net            -1140 ml   Filed Weights   05/16/16 1626 05/16/16 1706  Weight: 113.4 kg (250 lb) 108.2 kg (238 lb  8.6 oz)    Examination: General exam: Appears comfortable  HEENT: PERRLA, oral mucosa moist, no sclera icterus or thrush Respiratory system: Clear to auscultation. Respiratory effort normal. Cardiovascular system: S1 & S2 heard, RRR.  No murmurs  Gastrointestinal system: Abdomen soft, tender across lower abdomen,  Distended and tympanic. Normal bowel sound. No organomegaly Central nervous system: Alert and oriented. No focal neurological deficits. Extremities: No cyanosis, clubbing or edema Skin: No rashes or ulcers Psychiatry:  Mood & affect appropriate.     Data Reviewed: I have personally reviewed following labs and imaging studies  CBC:  Recent Labs Lab 05/16/16 1031 05/17/16 0538  WBC 5.1 8.9  HGB 13.4 10.9*  HCT 39.2 32.3*  MCV 93.6 94.2  PLT 220 123456   Basic Metabolic Panel:  Recent Labs Lab 05/16/16 1031 05/17/16 0538 05/18/16 0500  NA 133* 132* 134*  K 4.3 4.2 3.8  CL 101 104 102  CO2 19* 20* 22  GLUCOSE 246*  175* 154*  BUN 24* 25* 18  CREATININE 1.21 1.14 1.09  CALCIUM 9.4 8.2* 8.4*   GFR: Estimated Creatinine Clearance: 75.2 mL/min (by C-G formula based on SCr of 1.09 mg/dL). Liver Function Tests:  Recent Labs Lab 05/16/16 1031  AST 20  ALT 27  ALKPHOS 58  BILITOT 1.3*  PROT 8.0  ALBUMIN 4.0    Recent Labs Lab 05/16/16 1031  LIPASE 11   No results for input(s): AMMONIA in the last 168 hours. Coagulation Profile:  Recent Labs Lab 05/16/16 1726  INR 1.33   Cardiac Enzymes: No results for input(s): CKTOTAL, CKMB, CKMBINDEX, TROPONINI in the last 168 hours. BNP (last 3 results) No results for input(s): PROBNP in the last 8760 hours. HbA1C:  Recent Labs  05/16/16 1726  HGBA1C 7.2*   CBG:  Recent Labs Lab 05/18/16 1207 05/18/16 1646 05/18/16 2101 05/19/16 0757 05/19/16 1141  GLUCAP 171* 161* 171* 143* 272*   Lipid Profile: No results for input(s): CHOL, HDL, LDLCALC, TRIG, CHOLHDL, LDLDIRECT in the last 72 hours. Thyroid Function Tests:  Recent Labs  05/16/16 1726  TSH 1.113   Anemia Panel: No results for input(s): VITAMINB12, FOLATE, FERRITIN, TIBC, IRON, RETICCTPCT in the last 72 hours. Urine analysis:    Component Value Date/Time   COLORURINE ORANGE (A) 05/16/2016 1223   APPEARANCEUR CLOUDY (A) 05/16/2016 1223   LABSPEC 1.034 (H) 05/16/2016 1223   PHURINE 5.5 05/16/2016 1223   GLUCOSEU >1000 (A) 05/16/2016 1223   HGBUR NEGATIVE 05/16/2016 1223   HGBUR negative 05/17/2007 0835   BILIRUBINUR SMALL (A) 05/16/2016 1223   BILIRUBINUR n 02/28/2011   KETONESUR 15 (A) 05/16/2016 1223   PROTEINUR 30 (A) 05/16/2016 1223   UROBILINOGEN 1.0 10/11/2011 0215   NITRITE POSITIVE (A) 05/16/2016 1223   LEUKOCYTESUR SMALL (A) 05/16/2016 1223   Sepsis Labs: @LABRCNTIP (procalcitonin:4,lacticidven:4) ) Recent Results (from the past 240 hour(s))  Urine culture     Status: Abnormal   Collection Time: 05/16/16 12:23 PM  Result Value Ref Range Status    Specimen Description URINE, CLEAN CATCH  Final   Special Requests NONE  Final   Culture MULTIPLE SPECIES PRESENT, SUGGEST RECOLLECTION (A)  Final   Report Status 05/17/2016 FINAL  Final  Culture, blood (Routine X 2) w Reflex to ID Panel     Status: None (Preliminary result)   Collection Time: 05/16/16  5:25 PM  Result Value Ref Range Status   Specimen Description BLOOD RIGHT ARM  Final   Special Requests BOTTLES DRAWN AEROBIC AND ANAEROBIC  10CC  Final   Culture   Final    NO GROWTH 2 DAYS Performed at [facility]    Report Status PENDING  Incomplete  Culture, blood (Routine X 2) w Reflex to ID Panel     Status: None (Preliminary result)   Collection Time: 05/16/16  5:30 PM  Result Value Ref Range Status   Specimen Description BLOOD LEFT HAND  Final   Special Requests BOTTLES DRAWN AEROBIC AND ANAEROBIC  10CC  Final   Culture   Final    NO GROWTH 2 DAYS Performed at [facility]    Report Status PENDING  Incomplete         Radiology Studies: No results found.    Scheduled Meds: . atenolol  50 mg Oral Daily  . feeding supplement  1 Container Oral TID BM  . heparin  5,000 Units Subcutaneous Q8H  . HYDROcodone-acetaminophen  2 tablet Oral QID  . insulin aspart  0-9 Units Subcutaneous TID WC  . piperacillin-tazobactam (ZOSYN)  IV  3.375 g Intravenous Q8H  . sodium chloride flush  3 mL Intravenous Q12H   Continuous Infusions: . sodium chloride 75 mL/hr at 05/18/16 1429     LOS: 3 days    Time spent in minutes: 28    [place], MD [facility] Pager: [email or web address] Password [email or web address] 05/19/2016, 2:00 PM

## 2016-05-19 NOTE — Progress Notes (Signed)
  Subjective: He feels better still very sore on the right and tender to palpation on the left. But much improved from admission.  He reports some SOB, but it's pretty vague.  Objective: Vital signs in last 24 hours: Temp:  [98.2 F (36.8 C)-98.7 F (37.1 C)] 98.7 F (37.1 C) (07/28 0504) Pulse Rate:  [62-64] 64 (07/28 0504) Resp:  [18-20] 20 (07/28 0504) BP: (127-144)/(63-72) 137/72 (07/28 0504) SpO2:  [95 %-97 %] 97 % (07/28 0504) Last BM Date: 05/16/16 720 Po Urine 1800 Afebrile yesterday, VSS No labs today CT was 05/16/16 Intake/Output from previous day: 07/27 0701 - 07/28 0700 In: 1675 [P.O.:720; I.V.:955] Out: 1800 [Urine:1800] Intake/Output this shift: No intake/output data recorded.  General appearance: alert, cooperative and no distress GI: soft, tender, more in the RLQ than the left, but also tender LLQ to palpation.  Lab Results:   Recent Labs  05/16/16 1031 05/17/16 0538  WBC 5.1 8.9  HGB 13.4 10.9*  HCT 39.2 32.3*  PLT 220 203    BMET  Recent Labs  05/17/16 0538 05/18/16 0500  NA 132* 134*  K 4.2 3.8  CL 104 102  CO2 20* 22  GLUCOSE 175* 154*  BUN 25* 18  CREATININE 1.14 1.09  CALCIUM 8.2* 8.4*   PT/INR  Recent Labs  05/16/16 1726  LABPROT 16.1*  INR 1.33     Recent Labs Lab 05/16/16 1031  AST 20  ALT 27  ALKPHOS 58  BILITOT 1.3*  PROT 8.0  ALBUMIN 4.0     Lipase     Component Value Date/Time   LIPASE 11 05/16/2016 1031     Studies/Results: No results found.  Medications: . atenolol  50 mg Oral Daily  . feeding supplement  1 Container Oral TID BM  . heparin  5,000 Units Subcutaneous Q8H  . HYDROcodone-acetaminophen  2 tablet Oral QID  . insulin aspart  0-9 Units Subcutaneous TID WC  . piperacillin-tazobactam (ZOSYN)  IV  3.375 g Intravenous Q8H  . sodium chloride flush  3 mL Intravenous Q12H   . sodium chloride 75 mL/hr at 05/18/16 1429     CAD with 4 stents AODM OSA on  CPAP HYPERTENSION   Assessment/Plan Pelvic abscess - likely diverticular in origin   -- continue antibiotics will most likely get repeat CT this weekend. Original CT on 05/16/16.  Repeat labs in AM. IR with no window to drain FEN: iv FLUIDS/Clear liquids ID: day 4 Zosyn DVT: Heparin/SCD - Asprin/Plavix on hold   Dr. Harlow Asa note from yesterday,"consider cardiology eval - Dr. Ellouise Newer is patient's cardiologist."    LOS: 3 days    Earnstine Regal 05/19/2016 [phone]

## 2016-05-20 ENCOUNTER — Inpatient Hospital Stay (HOSPITAL_COMMUNITY): Payer: PPO

## 2016-05-20 DIAGNOSIS — E118 Type 2 diabetes mellitus with unspecified complications: Secondary | ICD-10-CM | POA: Diagnosis not present

## 2016-05-20 DIAGNOSIS — E1165 Type 2 diabetes mellitus with hyperglycemia: Secondary | ICD-10-CM | POA: Diagnosis not present

## 2016-05-20 DIAGNOSIS — A419 Sepsis, unspecified organism: Secondary | ICD-10-CM | POA: Diagnosis not present

## 2016-05-20 DIAGNOSIS — G4733 Obstructive sleep apnea (adult) (pediatric): Secondary | ICD-10-CM | POA: Diagnosis not present

## 2016-05-20 DIAGNOSIS — I10 Essential (primary) hypertension: Secondary | ICD-10-CM | POA: Diagnosis not present

## 2016-05-20 DIAGNOSIS — K651 Peritoneal abscess: Secondary | ICD-10-CM | POA: Diagnosis not present

## 2016-05-20 LAB — CBC
HEMATOCRIT: 32.4 % — AB (ref 39.0–52.0)
HEMOGLOBIN: 10.9 g/dL — AB (ref 13.0–17.0)
MCH: 31.5 pg (ref 26.0–34.0)
MCHC: 33.6 g/dL (ref 30.0–36.0)
MCV: 93.6 fL (ref 78.0–100.0)
Platelets: 275 10*3/uL (ref 150–400)
RBC: 3.46 MIL/uL — ABNORMAL LOW (ref 4.22–5.81)
RDW: 13.9 % (ref 11.5–15.5)
WBC: 10.1 10*3/uL (ref 4.0–10.5)

## 2016-05-20 LAB — BASIC METABOLIC PANEL
Anion gap: 8 (ref 5–15)
BUN: 11 mg/dL (ref 6–20)
CHLORIDE: 103 mmol/L (ref 101–111)
CO2: 24 mmol/L (ref 22–32)
CREATININE: 0.87 mg/dL (ref 0.61–1.24)
Calcium: 8.2 mg/dL — ABNORMAL LOW (ref 8.9–10.3)
GFR calc non Af Amer: >60 mL/min (ref >60)
Glucose, Bld: 174 mg/dL — ABNORMAL HIGH (ref 65–99)
POTASSIUM: 3.5 mmol/L (ref 3.5–5.1)
Sodium: 135 mmol/L (ref 135–145)

## 2016-05-20 LAB — GLUCOSE, CAPILLARY
GLUCOSE-CAPILLARY: 168 mg/dL — AB (ref 65–99)
GLUCOSE-CAPILLARY: 148 mg/dL — AB (ref 65–99)
Glucose-Capillary: 207 mg/dL — ABNORMAL HIGH (ref 65–99)
Glucose-Capillary: 159 mg/dL — ABNORMAL HIGH (ref 65–99)

## 2016-05-20 MED ORDER — BISACODYL 10 MG RE SUPP
10.0000 mg | Freq: Once | RECTAL | Status: AC
Start: 2016-05-20 — End: 2016-05-20
  Administered 2016-05-20: 10 mg via RECTAL
  Filled 2016-05-20: qty 1

## 2016-05-20 NOTE — Progress Notes (Signed)
PROGRESS NOTE    FLABIO VRADENBURG  [id number] DOB: 02-Apr-1946 DOA: 05/16/2016  PCP: [name], MD   Brief Narrative:  70 y/o with h/o CAD and 4 stents, OSA on CPAP, DM, HTN, HLD who presents for abdominal pain & constipation. Found to have an intra-abdominal abscess on CT scan.   Subjective: Abdomen still quite distended today. Still passing gas.    Assessment & Plan:   Principal Problem:   Intra-abdominal abscess - Sepsis  - Zosyn - IR unable to place drain as abscess is quite deep -  plan is to continue IV antibiotics with hopes to avoid surgery - cont clears - changed Vicodin to QID and cont Morphine PRN - ASA and plavix held due to possible need for surgery - abdomen still distended today but audible bowel sounds- no BM in 2 wks- will give a dulcolax suppository  - repeat CT tomorrow- temp 100.4 last night   Active Problems:   CAD with stents - 4 stents in RCA - last stent placed in 2009 - last nuclear stress test in 8/12 was negative for reversible ischemia - holding ASA and Plavix for now until it is decided that he will not need abdominal surgery -no recent chest pain/ discomfort  -  we do not need to consult cardiology at this time unless cardiac clearance is requested for surgery    Cough - clear lungs- not hyopoxic - Tessalon Perles     Diabetes mellitus type 2, uncontrolled - ISS- hold Metformin    OSA on CPAP - Cont CPAP     Essential hypertension - Atenolol- lisinopril on hold    Obesity, Class II, BMI 35-39.9, with comorbidity  Body mass index is 36.27 kg/m.    DVT prophylaxis: heparin Code Status: full code Family Communication:  Disposition Plan: home when stable Consultants:   gen surgery  IR Procedures:    Antimicrobials:  Anti-infectives    Start     Dose/Rate Route Frequency Ordered Stop   05/16/16 2200  piperacillin-tazobactam (ZOSYN) IVPB 3.375 g     3.375 g 12.5 mL/hr over 240 Minutes Intravenous Every 8 hours  05/16/16 1710     05/16/16 1500  piperacillin-tazobactam (ZOSYN) IVPB 3.375 g     3.375 g 100 mL/hr over 30 Minutes Intravenous  Once 05/16/16 1435 05/16/16 1613   05/16/16 1430  piperacillin-tazobactam (ZOSYN) IVPB 4.5 g  Status:  Discontinued     4.5 g 200 mL/hr over 30 Minutes Intravenous  Once 05/16/16 1357 05/16/16 1435       Objective: Vitals:   05/19/16 0504 05/19/16 1350 05/19/16 2145 05/20/16 0624  BP: 137/72 115/60 139/65 134/60  Pulse: 64 (!) 52 60 60  Resp: 20 18 20 20   Temp: 98.7 F (37.1 C) 98.8 F (37.1 C) (!) 100.4 F (38 C) 98 F (36.7 C)  TempSrc: Oral Oral Oral Oral  SpO2: 97% 94% 94% 94%  Weight:      Height:        Intake/Output Summary (Last 24 hours) at 05/20/16 1138 Last data filed at 05/20/16 0904  Gross per 24 hour  Intake             1995 ml  Output             1100 ml  Net              895 ml   Filed Weights   05/16/16 1626 05/16/16 1706  Weight: 113.4 kg (250 lb) 108.2 kg (  238 lb 8.6 oz)    Examination: General exam: Appears comfortable  HEENT: PERRLA, oral mucosa moist, no sclera icterus or thrush Respiratory system: Clear to auscultation. Respiratory effort normal. Cardiovascular system: S1 & S2 heard, RRR.  No murmurs  Gastrointestinal system: Abdomen soft, tender across lower abdomen,  Distended and tympanic. Normal bowel sound. No organomegaly Central nervous system: Alert and oriented. No focal neurological deficits. Extremities: No cyanosis, clubbing or edema Skin: No rashes or ulcers Psychiatry:  Mood & affect appropriate.     Data Reviewed: I have personally reviewed following labs and imaging studies  CBC:  Recent Labs Lab 05/16/16 1031 05/17/16 0538 05/20/16 0542  WBC 5.1 8.9 10.1  HGB 13.4 10.9* 10.9*  HCT 39.2 32.3* 32.4*  MCV 93.6 94.2 93.6  PLT 220 203 123XX123   Basic Metabolic Panel:  Recent Labs Lab 05/16/16 1031 05/17/16 0538 05/18/16 0500 05/20/16 0542  NA 133* 132* 134* 135  K 4.3 4.2 3.8 3.5    CL 101 104 102 103  CO2 19* 20* 22 24  GLUCOSE 246* 175* 154* 174*  BUN 24* 25* 18 11  CREATININE 1.21 1.14 1.09 0.87  CALCIUM 9.4 8.2* 8.4* 8.2*   GFR: Estimated Creatinine Clearance: 94.2 mL/min (by C-G formula based on SCr of 0.87 mg/dL). Liver Function Tests:  Recent Labs Lab 05/16/16 1031  AST 20  ALT 27  ALKPHOS 58  BILITOT 1.3*  PROT 8.0  ALBUMIN 4.0    Recent Labs Lab 05/16/16 1031  LIPASE 11   No results for input(s): AMMONIA in the last 168 hours. Coagulation Profile:  Recent Labs Lab 05/16/16 1726  INR 1.33   Cardiac Enzymes: No results for input(s): CKTOTAL, CKMB, CKMBINDEX, TROPONINI in the last 168 hours. BNP (last 3 results) No results for input(s): PROBNP in the last 8760 hours. HbA1C: No results for input(s): HGBA1C in the last 72 hours. CBG:  Recent Labs Lab 05/19/16 0757 05/19/16 1141 05/19/16 1700 05/19/16 2140 05/20/16 0747  GLUCAP 143* 272* 185* 155* 207*   Lipid Profile: No results for input(s): CHOL, HDL, LDLCALC, TRIG, CHOLHDL, LDLDIRECT in the last 72 hours. Thyroid Function Tests: No results for input(s): TSH, T4TOTAL, FREET4, T3FREE, THYROIDAB in the last 72 hours. Anemia Panel: No results for input(s): VITAMINB12, FOLATE, FERRITIN, TIBC, IRON, RETICCTPCT in the last 72 hours. Urine analysis:    Component Value Date/Time   COLORURINE ORANGE (A) 05/16/2016 1223   APPEARANCEUR CLOUDY (A) 05/16/2016 1223   LABSPEC 1.034 (H) 05/16/2016 1223   PHURINE 5.5 05/16/2016 1223   GLUCOSEU >1000 (A) 05/16/2016 1223   HGBUR NEGATIVE 05/16/2016 1223   HGBUR negative 05/17/2007 0835   BILIRUBINUR SMALL (A) 05/16/2016 1223   BILIRUBINUR n 02/28/2011   KETONESUR 15 (A) 05/16/2016 1223   PROTEINUR 30 (A) 05/16/2016 1223   UROBILINOGEN 1.0 10/11/2011 0215   NITRITE POSITIVE (A) 05/16/2016 1223   LEUKOCYTESUR SMALL (A) 05/16/2016 1223   Sepsis Labs: @LABRCNTIP (procalcitonin:4,lacticidven:4) ) Recent Results (from the past 240  hour(s))  Urine culture     Status: Abnormal   Collection Time: 05/16/16 12:23 PM  Result Value Ref Range Status   Specimen Description URINE, CLEAN CATCH  Final   Special Requests NONE  Final   Culture MULTIPLE SPECIES PRESENT, SUGGEST RECOLLECTION (A)  Final   Report Status 05/17/2016 FINAL  Final  Culture, blood (Routine X 2) w Reflex to ID Panel     Status: None (Preliminary result)   Collection Time: 05/16/16  5:25 PM  Result  Value Ref Range Status   Specimen Description BLOOD RIGHT ARM  Final   Special Requests BOTTLES DRAWN AEROBIC AND ANAEROBIC  10CC  Final   Culture   Final    NO GROWTH 3 DAYS Performed at [facility]    Report Status PENDING  Incomplete  Culture, blood (Routine X 2) w Reflex to ID Panel     Status: None (Preliminary result)   Collection Time: 05/16/16  5:30 PM  Result Value Ref Range Status   Specimen Description BLOOD LEFT HAND  Final   Special Requests BOTTLES DRAWN AEROBIC AND ANAEROBIC  10CC  Final   Culture   Final    NO GROWTH 3 DAYS Performed at [facility]    Report Status PENDING  Incomplete         Radiology Studies: No results found.    Scheduled Meds: . atenolol  50 mg Oral Daily  . feeding supplement  1 Container Oral TID BM  . heparin  5,000 Units Subcutaneous Q8H  . HYDROcodone-acetaminophen  2 tablet Oral QID  . insulin aspart  0-9 Units Subcutaneous TID WC  . piperacillin-tazobactam (ZOSYN)  IV  3.375 g Intravenous Q8H  . simethicone  80 mg Oral QID  . sodium chloride flush  3 mL Intravenous Q12H   Continuous Infusions: . sodium chloride 75 mL/hr at 05/20/16 0628     LOS: 4 days    Time spent in minutes: 51    [place], MD [facility] Pager: [email or web address] Password [facility] 05/20/2016, 11:38 AM

## 2016-05-20 NOTE — Progress Notes (Signed)
Assumed care of patient. Agree with previous nurse assessment. No complaints or concerns at this time.  Barbee Shropshire. Brigitte Pulse, RN

## 2016-05-21 ENCOUNTER — Inpatient Hospital Stay (HOSPITAL_COMMUNITY): Payer: PPO

## 2016-05-21 ENCOUNTER — Encounter (HOSPITAL_COMMUNITY): Payer: Self-pay | Admitting: Radiology

## 2016-05-21 DIAGNOSIS — K8020 Calculus of gallbladder without cholecystitis without obstruction: Secondary | ICD-10-CM | POA: Diagnosis not present

## 2016-05-21 DIAGNOSIS — E1165 Type 2 diabetes mellitus with hyperglycemia: Secondary | ICD-10-CM | POA: Diagnosis not present

## 2016-05-21 DIAGNOSIS — E118 Type 2 diabetes mellitus with unspecified complications: Secondary | ICD-10-CM | POA: Diagnosis not present

## 2016-05-21 DIAGNOSIS — A419 Sepsis, unspecified organism: Secondary | ICD-10-CM | POA: Diagnosis not present

## 2016-05-21 DIAGNOSIS — K651 Peritoneal abscess: Secondary | ICD-10-CM | POA: Diagnosis not present

## 2016-05-21 DIAGNOSIS — G4733 Obstructive sleep apnea (adult) (pediatric): Secondary | ICD-10-CM | POA: Diagnosis not present

## 2016-05-21 DIAGNOSIS — I10 Essential (primary) hypertension: Secondary | ICD-10-CM | POA: Diagnosis not present

## 2016-05-21 DIAGNOSIS — K5732 Diverticulitis of large intestine without perforation or abscess without bleeding: Secondary | ICD-10-CM | POA: Diagnosis not present

## 2016-05-21 LAB — GLUCOSE, CAPILLARY
GLUCOSE-CAPILLARY: 154 mg/dL — AB (ref 65–99)
GLUCOSE-CAPILLARY: 160 mg/dL — AB (ref 65–99)
Glucose-Capillary: 150 mg/dL — ABNORMAL HIGH (ref 65–99)
Glucose-Capillary: 129 mg/dL — ABNORMAL HIGH (ref 65–99)

## 2016-05-21 LAB — CULTURE, BLOOD (ROUTINE X 2)
CULTURE: NO GROWTH
Culture: NO GROWTH

## 2016-05-21 MED ORDER — IOPAMIDOL (ISOVUE-300) INJECTION 61%
100.0000 mL | Freq: Once | INTRAVENOUS | Status: AC | PRN
  Administered 2016-05-21: 100 mL via INTRAVENOUS

## 2016-05-21 MED ORDER — DIATRIZOATE MEGLUMINE & SODIUM 66-10 % PO SOLN
30.0000 mL | Freq: Once | ORAL | Status: AC
  Administered 2016-05-21: 30 mL via ORAL
  Filled 2016-05-21: qty 30

## 2016-05-21 NOTE — Progress Notes (Signed)
Patient ID: Evan Avery, male   DOB: 07-Nov-1945, 70 y.o.   MRN: [id number] [facility] Surgery Progress Note:   * No surgery found *  Subjective: Mental status is clear.  Drinking contrast for pending CT scan.   Objective: Vital signs in last 24 hours: Temp:  [98 F (36.7 C)-98.8 F (37.1 C)] 98 F (36.7 C) (07/30 0540) Pulse Rate:  [51-61] 61 (07/30 0540) Resp:  [20] 20 (07/30 0540) BP: (116-142)/(61-64) 142/64 (07/30 0540) SpO2:  [95 %-96 %] 95 % (07/30 0540)  Intake/Output from previous day: 07/29 0701 - 07/30 0700 In: 1545 [P.O.:120; I.V.:1275; IV Piggyback:150] Out: 626 [Urine:775] Intake/Output this shift: No intake/output data recorded.  Physical Exam: Work of breathing is normal.  He has had several BMs since yesterday and is feeling better.  Sore in abdomen from pulling up.    Lab Results:  Results for orders placed or performed during the hospital encounter of 05/16/16 (from the past 48 hour(s))  Glucose, capillary     Status: Abnormal   Collection Time: 05/19/16 11:41 AM  Result Value Ref Range   Glucose-Capillary 272 (H) 65 - 99 mg/dL  Glucose, capillary     Status: Abnormal   Collection Time: 05/19/16  5:00 PM  Result Value Ref Range   Glucose-Capillary 185 (H) 65 - 99 mg/dL  Glucose, capillary     Status: Abnormal   Collection Time: 05/19/16  9:40 PM  Result Value Ref Range   Glucose-Capillary 155 (H) 65 - 99 mg/dL   Comment 1 Notify RN   CBC     Status: Abnormal   Collection Time: 05/20/16  5:42 AM  Result Value Ref Range   WBC 10.1 4.0 - 10.5 K/uL   RBC 3.46 (L) 4.22 - 5.81 MIL/uL   Hemoglobin 10.9 (L) 13.0 - 17.0 g/dL   HCT 32.4 (L) 39.0 - 52.0 %   MCV 93.6 78.0 - 100.0 fL   MCH 31.5 26.0 - 34.0 pg   MCHC 33.6 30.0 - 36.0 g/dL   RDW 13.9 11.5 - 15.5 %   Platelets 275 150 - 400 K/uL  Basic metabolic panel     Status: Abnormal   Collection Time: 05/20/16  5:42 AM  Result Value Ref Range   Sodium 135 135 - 145 mmol/L   Potassium 3.5 3.5 -  5.1 mmol/L   Chloride 103 101 - 111 mmol/L   CO2 24 22 - 32 mmol/L   Glucose, Bld 174 (H) 65 - 99 mg/dL   BUN 11 6 - 20 mg/dL   Creatinine, Ser 0.87 0.61 - 1.24 mg/dL   Calcium 8.2 (L) 8.9 - 10.3 mg/dL   GFR calc non Af Amer >60 >60 mL/min   GFR calc Af Amer >60 >60 mL/min    Comment: (NOTE) The eGFR has been calculated using the CKD EPI equation. This calculation has not been validated in all clinical situations. eGFR's persistently <60 mL/min signify possible Chronic Kidney Disease.    Anion gap 8 5 - 15  Glucose, capillary     Status: Abnormal   Collection Time: 05/20/16  7:47 AM  Result Value Ref Range   Glucose-Capillary 207 (H) 65 - 99 mg/dL  Glucose, capillary     Status: Abnormal   Collection Time: 05/20/16 11:58 AM  Result Value Ref Range   Glucose-Capillary 159 (H) 65 - 99 mg/dL  Glucose, capillary     Status: Abnormal   Collection Time: 05/20/16  4:56 PM  Result Value Ref Range  Glucose-Capillary 168 (H) 65 - 99 mg/dL  Glucose, capillary     Status: Abnormal   Collection Time: 05/20/16  9:19 PM  Result Value Ref Range   Glucose-Capillary 148 (H) 65 - 99 mg/dL   Comment 1 Notify RN   Glucose, capillary     Status: Abnormal   Collection Time: 05/21/16  7:33 AM  Result Value Ref Range   Glucose-Capillary 154 (H) 65 - 99 mg/dL    Radiology/Results: No results found.  Anti-infectives: Anti-infectives    Start     Dose/Rate Route Frequency Ordered Stop   05/16/16 2200  piperacillin-tazobactam (ZOSYN) IVPB 3.375 g     3.375 g 12.5 mL/hr over 240 Minutes Intravenous Every 8 hours 05/16/16 1710     05/16/16 1500  piperacillin-tazobactam (ZOSYN) IVPB 3.375 g     3.375 g 100 mL/hr over 30 Minutes Intravenous  Once 05/16/16 1435 05/16/16 1613   05/16/16 1430  piperacillin-tazobactam (ZOSYN) IVPB 4.5 g  Status:  Discontinued     4.5 g 200 mL/hr over 30 Minutes Intravenous  Once 05/16/16 1357 05/16/16 1435      Assessment/Plan: Problem List: Patient Active  Problem List   Diagnosis Date Noted  . Intra-abdominal abscess ([place]) 05/16/2016  . Glaucoma   . Health maintenance examination 10/07/2014  . Medicare annual wellness visit, subsequent 10/07/2014  . Advanced care planning/counseling discussion 10/07/2014  . Smoker 10/07/2014  . Bilateral foot pain 09/07/2014  . CAD in native artery 04/17/2014  . Hyperlipidemia with target LDL less than 70 10/08/2013  . Essential hypertension 10/08/2013  . Obesity, Class II, BMI 35-39.9, with comorbidity ([place]) 10/08/2013  . Complex tear of medial meniscus of left knee as current injury 07/26/2012  . Sepsis ([place]) 10/11/2011  . OTHER ATOPIC DERMATITIS AND RELATED CONDITIONS 06/24/2010  . PRURITUS 10/08/2009  . LUMBAR RADICULOPATHY 07/18/2007  . Diabetes mellitus type 2, uncontrolled, with complications ([place]) 27/74/1287  . HIP PAIN, BILATERAL 07/05/2007  . MYOCARDIAL INFARCTION, HX OF 05/07/2007  . OSA on CPAP 05/07/2007    CT scan to assess diverticulitis is pending.  * No surgery found *    LOS: 5 days   Matt B. Hassell Done, MD, [facility] [phone] beeper [phone]  05/21/2016 10:03 AM

## 2016-05-21 NOTE — Progress Notes (Signed)
PROGRESS NOTE    Evan Avery  [id number] DOB: [birth year] DOA: 05/16/2016  PCP: [name], MD   Brief Narrative:  70 y/o with h/o CAD and 4 stents, OSA on CPAP, DM, HTN, HLD who presents for abdominal pain & constipation. Found to have an intra-abdominal abscess on CT scan.   Subjective: Abdomen still quite sore today despite 3 BMs yesterday. He thinks abdomen is sore from pulling up in the bed.   Assessment & Plan:   Principal Problem:   Intra-abdominal abscess - Sepsis  - Zosyn - IR unable to place drain as abscess is quite deep -  plan is to continue IV antibiotics with hopes to avoid surgery - cont clears - changed Vicodin to QID and cont Morphine PRN - ASA and plavix held due to possible need for surgery  - repeat CT today    Active Problems: Constipation - no BM in 2 wks- resolved with dulcolax suppository    CAD with stents - 4 stents in RCA - last stent placed in 2009 - last nuclear stress test in 8/12 was negative for reversible ischemia - holding ASA and Plavix for now until it is decided that he will not need abdominal surgery -no recent chest pain/ discomfort  -  we do not need to consult cardiology at this time unless cardiac clearance is requested for surgery    Cough - clear lungs- not hyopoxic - Tessalon Perles     Diabetes mellitus type 2, uncontrolled - ISS- hold Metformin    OSA on CPAP - Cont CPAP     Essential hypertension - Atenolol- lisinopril on hold    Obesity, Class II, BMI 35-39.9, with comorbidity  Body mass index is 36.27 kg/m.    DVT prophylaxis: heparin Code Status: full code Family Communication:  Disposition Plan: home when stable Consultants:   gen surgery  IR Procedures:    Antimicrobials:  Anti-infectives    Start     Dose/Rate Route Frequency Ordered Stop   05/16/16 2200  piperacillin-tazobactam (ZOSYN) IVPB 3.375 g     3.375 g 12.5 mL/hr over 240 Minutes Intravenous Every 8 hours 05/16/16  1710     05/16/16 1500  piperacillin-tazobactam (ZOSYN) IVPB 3.375 g     3.375 g 100 mL/hr over 30 Minutes Intravenous  Once 05/16/16 1435 05/16/16 1613   05/16/16 1430  piperacillin-tazobactam (ZOSYN) IVPB 4.5 g  Status:  Discontinued     4.5 g 200 mL/hr over 30 Minutes Intravenous  Once 05/16/16 1357 05/16/16 1435       Objective: Vitals:   05/20/16 0624 05/20/16 1320 05/20/16 2114 05/21/16 0540  BP: 134/60 116/61 138/62 (!) 142/64  Pulse: 60 (!) 51 (!) 55 61  Resp: 20 20 20 20   Temp: 98 F (36.7 C) 98.1 F (36.7 C) 98.8 F (37.1 C) 98 F (36.7 C)  TempSrc: Oral Oral Oral Oral  SpO2: 94% 96% 95% 95%  Weight:      Height:        Intake/Output Summary (Last 24 hours) at 05/21/16 1040 Last data filed at 05/21/16 0557  Gross per 24 hour  Intake             1545 ml  Output              650 ml  Net              895 ml   Filed Weights   05/16/16 1626 05/16/16 1706  Weight: 113.4 kg (250  lb) 108.2 kg (238 lb 8.6 oz)    Examination: General exam: Appears comfortable  HEENT: PERRLA, oral mucosa moist, no sclera icterus or thrush Respiratory system: Clear to auscultation. Respiratory effort normal. Cardiovascular system: S1 & S2 heard, RRR.  No murmurs  Gastrointestinal system: Abdomen soft, tender across lower abdomen,  Still mildly distended and tympanic. Normal bowel sound. No organomegaly Central nervous system: Alert and oriented. No focal neurological deficits. Extremities: No cyanosis, clubbing or edema Skin: No rashes or ulcers Psychiatry:  Mood & affect appropriate.     Data Reviewed: I have personally reviewed following labs and imaging studies  CBC:  Recent Labs Lab 05/16/16 1031 05/17/16 0538 05/20/16 0542  WBC 5.1 8.9 10.1  HGB 13.4 10.9* 10.9*  HCT 39.2 32.3* 32.4*  MCV 93.6 94.2 93.6  PLT 220 203 [id number]   Basic Metabolic Panel:  Recent Labs Lab 05/16/16 1031 05/17/16 0538 05/18/16 0500 05/20/16 0542  NA 133* 132* 134* 135  K 4.3 4.2 3.8  3.5  CL 101 104 102 103  CO2 19* 20* 22 24  GLUCOSE 246* 175* 154* 174*  BUN 24* 25* 18 11  CREATININE 1.21 1.14 1.09 0.87  CALCIUM 9.4 8.2* 8.4* 8.2*   GFR: Estimated Creatinine Clearance: 94.2 mL/min (by C-G formula based on SCr of 0.87 mg/dL). Liver Function Tests:  Recent Labs Lab 05/16/16 1031  AST 20  ALT 27  ALKPHOS 58  BILITOT 1.3*  PROT 8.0  ALBUMIN 4.0    Recent Labs Lab 05/16/16 1031  LIPASE 11   No results for input(s): AMMONIA in the last 168 hours. Coagulation Profile:  Recent Labs Lab 05/16/16 1726  INR 1.33   Cardiac Enzymes: No results for input(s): CKTOTAL, CKMB, CKMBINDEX, TROPONINI in the last 168 hours. BNP (last 3 results) No results for input(s): PROBNP in the last 8760 hours. HbA1C: No results for input(s): HGBA1C in the last 72 hours. CBG:  Recent Labs Lab 05/20/16 0747 05/20/16 1158 05/20/16 1656 05/20/16 2119 05/21/16 0733  GLUCAP 207* 159* 168* 148* 154*   Lipid Profile: No results for input(s): CHOL, HDL, LDLCALC, TRIG, CHOLHDL, LDLDIRECT in the last 72 hours. Thyroid Function Tests: No results for input(s): TSH, T4TOTAL, FREET4, T3FREE, THYROIDAB in the last 72 hours. Anemia Panel: No results for input(s): VITAMINB12, FOLATE, FERRITIN, TIBC, IRON, RETICCTPCT in the last 72 hours. Urine analysis:    Component Value Date/Time   COLORURINE ORANGE (A) 05/16/2016 1223   APPEARANCEUR CLOUDY (A) 05/16/2016 1223   LABSPEC 1.034 (H) 05/16/2016 1223   PHURINE 5.5 05/16/2016 1223   GLUCOSEU >1000 (A) 05/16/2016 1223   HGBUR NEGATIVE 05/16/2016 1223   HGBUR negative 05/17/2007 0835   BILIRUBINUR SMALL (A) 05/16/2016 1223   BILIRUBINUR n 02/28/2011   KETONESUR 15 (A) 05/16/2016 1223   PROTEINUR 30 (A) 05/16/2016 1223   UROBILINOGEN 1.0 10/11/2011 0215   NITRITE POSITIVE (A) 05/16/2016 1223   LEUKOCYTESUR SMALL (A) 05/16/2016 1223   Sepsis Labs: @LABRCNTIP (procalcitonin:4,lacticidven:4) ) Recent Results (from the past 240  hour(s))  Urine culture     Status: Abnormal   Collection Time: 05/16/16 12:23 PM  Result Value Ref Range Status   Specimen Description URINE, CLEAN CATCH  Final   Special Requests NONE  Final   Culture MULTIPLE SPECIES PRESENT, SUGGEST RECOLLECTION (A)  Final   Report Status 05/17/2016 FINAL  Final  Culture, blood (Routine X 2) w Reflex to ID Panel     Status: None (Preliminary result)   Collection Time: 05/16/16  5:25 PM  Result Value Ref Range Status   Specimen Description BLOOD RIGHT ARM  Final   Special Requests BOTTLES DRAWN AEROBIC AND ANAEROBIC  10CC  Final   Culture   Final    NO GROWTH 4 DAYS Performed at [facility]    Report Status PENDING  Incomplete  Culture, blood (Routine X 2) w Reflex to ID Panel     Status: None (Preliminary result)   Collection Time: 05/16/16  5:30 PM  Result Value Ref Range Status   Specimen Description BLOOD LEFT HAND  Final   Special Requests BOTTLES DRAWN AEROBIC AND ANAEROBIC  10CC  Final   Culture   Final    NO GROWTH 4 DAYS Performed at [facility]    Report Status PENDING  Incomplete         Radiology Studies: No results found.    Scheduled Meds: . atenolol  50 mg Oral Daily  . diatrizoate meglumine-sodium  30 mL Oral Once  . feeding supplement  1 Container Oral TID BM  . heparin  5,000 Units Subcutaneous Q8H  . HYDROcodone-acetaminophen  2 tablet Oral QID  . insulin aspart  0-9 Units Subcutaneous TID WC  . piperacillin-tazobactam (ZOSYN)  IV  3.375 g Intravenous Q8H  . simethicone  80 mg Oral QID  . sodium chloride flush  3 mL Intravenous Q12H   Continuous Infusions: . sodium chloride 75 mL/hr at 05/21/16 0603     LOS: 5 days    Time spent in minutes: 32    [place], MD [facility] Pager: [email or web address] Password [email or web address] 05/21/2016, 10:40 AM

## 2016-05-22 DIAGNOSIS — E1165 Type 2 diabetes mellitus with hyperglycemia: Secondary | ICD-10-CM | POA: Diagnosis not present

## 2016-05-22 DIAGNOSIS — G4733 Obstructive sleep apnea (adult) (pediatric): Secondary | ICD-10-CM | POA: Diagnosis not present

## 2016-05-22 DIAGNOSIS — K5720 Diverticulitis of large intestine with perforation and abscess without bleeding: Secondary | ICD-10-CM | POA: Diagnosis not present

## 2016-05-22 DIAGNOSIS — K651 Peritoneal abscess: Secondary | ICD-10-CM | POA: Diagnosis not present

## 2016-05-22 DIAGNOSIS — E118 Type 2 diabetes mellitus with unspecified complications: Secondary | ICD-10-CM | POA: Diagnosis not present

## 2016-05-22 DIAGNOSIS — I10 Essential (primary) hypertension: Secondary | ICD-10-CM | POA: Diagnosis not present

## 2016-05-22 DIAGNOSIS — K5732 Diverticulitis of large intestine without perforation or abscess without bleeding: Secondary | ICD-10-CM | POA: Diagnosis not present

## 2016-05-22 DIAGNOSIS — A419 Sepsis, unspecified organism: Secondary | ICD-10-CM | POA: Diagnosis not present

## 2016-05-22 LAB — COMPREHENSIVE METABOLIC PANEL
ALK PHOS: 72 U/L (ref 38–126)
ALT: 22 U/L (ref 17–63)
AST: 28 U/L (ref 15–41)
Albumin: 2.3 g/dL — ABNORMAL LOW (ref 3.5–5.0)
Anion gap: 10 (ref 5–15)
BUN: 13 mg/dL (ref 6–20)
CALCIUM: 8.3 mg/dL — AB (ref 8.9–10.3)
CHLORIDE: 105 mmol/L (ref 101–111)
CO2: 23 mmol/L (ref 22–32)
CREATININE: 0.84 mg/dL (ref 0.61–1.24)
GFR calc non Af Amer: >60 mL/min (ref >60)
Glucose, Bld: 151 mg/dL — ABNORMAL HIGH (ref 65–99)
Potassium: 3.6 mmol/L (ref 3.5–5.1)
SODIUM: 138 mmol/L (ref 135–145)
Total Bilirubin: 0.9 mg/dL (ref 0.3–1.2)
Total Protein: 5.7 g/dL — ABNORMAL LOW (ref 6.5–8.1)

## 2016-05-22 LAB — CBC
HCT: 31.6 % — ABNORMAL LOW (ref 39.0–52.0)
HEMOGLOBIN: 10.6 g/dL — AB (ref 13.0–17.0)
MCH: 31.6 pg (ref 26.0–34.0)
MCHC: 33.5 g/dL (ref 30.0–36.0)
MCV: 94.3 fL (ref 78.0–100.0)
PLATELETS: 363 10*3/uL (ref 150–400)
RBC: 3.35 MIL/uL — AB (ref 4.22–5.81)
RDW: 14.1 % (ref 11.5–15.5)
WBC: 10.7 10*3/uL — AB (ref 4.0–10.5)

## 2016-05-22 LAB — GLUCOSE, CAPILLARY
GLUCOSE-CAPILLARY: 149 mg/dL — AB (ref 65–99)
GLUCOSE-CAPILLARY: 155 mg/dL — AB (ref 65–99)
Glucose-Capillary: 160 mg/dL — ABNORMAL HIGH (ref 65–99)
Glucose-Capillary: 198 mg/dL — ABNORMAL HIGH (ref 65–99)

## 2016-05-22 LAB — PREALBUMIN: Prealbumin: 4.6 mg/dL — ABNORMAL LOW (ref 18–38)

## 2016-05-22 NOTE — Care Management Important Message (Signed)
Important Message  Patient Details  Name: Evan Avery MRN: [id number] Date of Birth: Sep 11, 1946   Medicare Important Message Given:  Yes    [name] 05/22/2016, 10:20 AMImportant Message  Patient Details  Name: Evan Avery MRN: [id number] Date of Birth: [birth year]   Medicare Important Message Given:  Yes    [name] 05/22/2016, 10:20 AM

## 2016-05-22 NOTE — Progress Notes (Signed)
PROGRESS NOTE    Evan Avery  [id number] DOB: 25-Jun-1946 DOA: 05/16/2016  PCP: [name], MD   Brief Narrative:  70 y/o with h/o CAD and 4 stents, OSA on CPAP, DM, HTN, HLD who presents for abdominal pain & constipation. Found to have an intra-abdominal abscess on CT scan.   Subjective: Abdomen is still quite sore today - continues to have loose BMs starting after Dulcolax suppository.   Assessment & Plan:   Principal Problem:   Intra-abdominal abscess - Sepsis  - Zosyn - IR unable to place drain as abscess is quite deep -  plan is to continue IV antibiotics with hopes to avoid surgery - cont clears - changed Vicodin to QID and cont Morphine PRN - ASA and plavix held due to possible need for surgery  - repeat CT shows that he has many new small abscesses- prior abscess is improving - gen surgery to further advise   Active Problems: Constipation - no BM in 2 wks- resolved with dulcolax suppository- now having ongoing loose stools     CAD with stents - 4 stents in RCA - last stent placed in 2009 - last nuclear stress test in 8/12 was negative for reversible ischemia - holding ASA and Plavix for now until it is decided that he will not need abdominal surgery -no recent chest pain/ discomfort  -  we do not need to consult cardiology at this time unless cardiac clearance is requested for surgery    Cough - clear lungs- not hyopoxic - Tessalon Perles     Diabetes mellitus type 2, uncontrolled - ISS- hold Metformin    OSA on CPAP - Cont CPAP     Essential hypertension - Atenolol- lisinopril on hold    Obesity, Class II, BMI 35-39.9, with comorbidity  Body mass index is 36.27 kg/m.    DVT prophylaxis: heparin Code Status: full code Family Communication:  Disposition Plan: home when stable Consultants:   gen surgery  IR Procedures:    Antimicrobials:  Anti-infectives    Start     Dose/Rate Route Frequency Ordered Stop   05/16/16 2200   piperacillin-tazobactam (ZOSYN) IVPB 3.375 g     3.375 g 12.5 mL/hr over 240 Minutes Intravenous Every 8 hours 05/16/16 1710     05/16/16 1500  piperacillin-tazobactam (ZOSYN) IVPB 3.375 g     3.375 g 100 mL/hr over 30 Minutes Intravenous  Once 05/16/16 1435 05/16/16 1613   05/16/16 1430  piperacillin-tazobactam (ZOSYN) IVPB 4.5 g  Status:  Discontinued     4.5 g 200 mL/hr over 30 Minutes Intravenous  Once 05/16/16 1357 05/16/16 1435       Objective: Vitals:   05/21/16 0540 05/21/16 1430 05/21/16 2117 05/22/16 0516  BP: (!) 142/64 130/65 (!) 153/61 (!) 152/56  Pulse: 61 (!) 51 (!) 58 (!) 57  Resp: 20 20 20 20   Temp: 98 F (36.7 C) 97.7 F (36.5 C) 99.5 F (37.5 C) 98.3 F (36.8 C)  TempSrc: Oral Oral Oral Oral  SpO2: 95% 96% 98% 96%  Weight:      Height:        Intake/Output Summary (Last 24 hours) at 05/22/16 1204 Last data filed at 05/22/16 0700  Gross per 24 hour  Intake             2550 ml  Output                0 ml  Net  2550 ml   Filed Weights   05/16/16 1626 05/16/16 1706  Weight: 113.4 kg (250 lb) 108.2 kg (238 lb 8.6 oz)    Examination: General exam: Appears comfortable  HEENT: PERRLA, oral mucosa moist, no sclera icterus or thrush Respiratory system: Clear to auscultation. Respiratory effort normal. Cardiovascular system: S1 & S2 heard, RRR.  No murmurs  Gastrointestinal system: Abdomen soft, tender across lower abdomen,  Non-distended.  Normal bowel sound. No organomegaly Central nervous system: Alert and oriented. No focal neurological deficits. Extremities: No cyanosis, clubbing or edema Skin: No rashes or ulcers Psychiatry:  Mood & affect appropriate.     Data Reviewed: I have personally reviewed following labs and imaging studies  CBC:  Recent Labs Lab 05/16/16 1031 05/17/16 0538 05/20/16 0542 05/22/16 1041  WBC 5.1 8.9 10.1 10.7*  HGB 13.4 10.9* 10.9* 10.6*  HCT 39.2 32.3* 32.4* 31.6*  MCV 93.6 94.2 93.6 94.3  PLT 220  203 275 [id number]   Basic Metabolic Panel:  Recent Labs Lab 05/16/16 1031 05/17/16 0538 05/18/16 0500 05/20/16 0542 05/22/16 1041  NA 133* 132* 134* 135 138  K 4.3 4.2 3.8 3.5 3.6  CL 101 104 102 103 105  CO2 19* 20* 22 24 23   GLUCOSE 246* 175* 154* 174* 151*  BUN 24* 25* 18 11 13   CREATININE 1.21 1.14 1.09 0.87 0.84  CALCIUM 9.4 8.2* 8.4* 8.2* 8.3*   GFR: Estimated Creatinine Clearance: 97.6 mL/min (by C-G formula based on SCr of 0.84 mg/dL). Liver Function Tests:  Recent Labs Lab 05/16/16 1031 05/22/16 1041  AST 20 28  ALT 27 22  ALKPHOS 58 72  BILITOT 1.3* 0.9  PROT 8.0 5.7*  ALBUMIN 4.0 2.3*    Recent Labs Lab 05/16/16 1031  LIPASE 11   No results for input(s): AMMONIA in the last 168 hours. Coagulation Profile:  Recent Labs Lab 05/16/16 1726  INR 1.33   Cardiac Enzymes: No results for input(s): CKTOTAL, CKMB, CKMBINDEX, TROPONINI in the last 168 hours. BNP (last 3 results) No results for input(s): PROBNP in the last 8760 hours. HbA1C: No results for input(s): HGBA1C in the last 72 hours. CBG:  Recent Labs Lab 05/21/16 1218 05/21/16 1709 05/21/16 2121 05/22/16 0738 05/22/16 1138  GLUCAP 160* 150* 129* 149* 160*   Lipid Profile: No results for input(s): CHOL, HDL, LDLCALC, TRIG, CHOLHDL, LDLDIRECT in the last 72 hours. Thyroid Function Tests: No results for input(s): TSH, T4TOTAL, FREET4, T3FREE, THYROIDAB in the last 72 hours. Anemia Panel: No results for input(s): VITAMINB12, FOLATE, FERRITIN, TIBC, IRON, RETICCTPCT in the last 72 hours. Urine analysis:    Component Value Date/Time   COLORURINE ORANGE (A) 05/16/2016 1223   APPEARANCEUR CLOUDY (A) 05/16/2016 1223   LABSPEC 1.034 (H) 05/16/2016 1223   PHURINE 5.5 05/16/2016 1223   GLUCOSEU >1000 (A) 05/16/2016 1223   HGBUR NEGATIVE 05/16/2016 1223   HGBUR negative 05/17/2007 0835   BILIRUBINUR SMALL (A) 05/16/2016 1223   BILIRUBINUR n 02/28/2011   KETONESUR 15 (A) 05/16/2016 1223    PROTEINUR 30 (A) 05/16/2016 1223   UROBILINOGEN 1.0 10/11/2011 0215   NITRITE POSITIVE (A) 05/16/2016 1223   LEUKOCYTESUR SMALL (A) 05/16/2016 1223   Sepsis Labs: @LABRCNTIP (procalcitonin:4,lacticidven:4) ) Recent Results (from the past 240 hour(s))  Urine culture     Status: Abnormal   Collection Time: 05/16/16 12:23 PM  Result Value Ref Range Status   Specimen Description URINE, CLEAN CATCH  Final   Special Requests NONE  Final   Culture MULTIPLE SPECIES  PRESENT, SUGGEST RECOLLECTION (A)  Final   Report Status 05/17/2016 FINAL  Final  Culture, blood (Routine X 2) w Reflex to ID Panel     Status: None   Collection Time: 05/16/16  5:25 PM  Result Value Ref Range Status   Specimen Description BLOOD RIGHT ARM  Final   Special Requests BOTTLES DRAWN AEROBIC AND ANAEROBIC  10CC  Final   Culture   Final    NO GROWTH 5 DAYS Performed at [facility]    Report Status 05/21/2016 FINAL  Final  Culture, blood (Routine X 2) w Reflex to ID Panel     Status: None   Collection Time: 05/16/16  5:30 PM  Result Value Ref Range Status   Specimen Description BLOOD LEFT HAND  Final   Special Requests BOTTLES DRAWN AEROBIC AND ANAEROBIC  10CC  Final   Culture   Final    NO GROWTH 5 DAYS Performed at [facility]    Report Status 05/21/2016 FINAL  Final         Radiology Studies: Ct Abdomen Pelvis W Contrast  Result Date: 05/21/2016 CLINICAL DATA:  70 year old male -follow-up abdominal/pelvic abscesses. EXAM: CT ABDOMEN AND PELVIS WITH CONTRAST TECHNIQUE: Multidetector CT imaging of the abdomen and pelvis was performed using the standard protocol following bolus administration of intravenous contrast. CONTRAST:  160mL ISOVUE-300 IOPAMIDOL (ISOVUE-300) INJECTION 61% COMPARISON:  05/16/2016 CT FINDINGS: Lower chest: Trace bilateral pleural effusions and bibasilar atelectasis are now noted. Hepatobiliary: The liver is unchanged. Cholelithiasis noted without CT evidence of acute  cholecystitis. No biliary dilatation identified. Pancreas: Unremarkable Spleen: Unremarkable Adrenals/Urinary Tract: The 2 x 4 cm left adrenal adenoma is unchanged. The kidneys, right adrenal gland and bladder are unremarkable. Stomach/Bowel: The 5 cm collection/abscess within the central pelvis now primarily contains gas. Better defined focal fluid collections within the mesentery, right pericolic gutter and pelvis are identified as follows: A 1.9 x 2.8 cm right mesenteric collection (image 64). A 1.5 x 2.3 cm central pelvic mesenteric collection (image 65) A 2 x 4 cm right paracolic gutter collection (image 66) A 2.9 x 3.7 cm collection within the left pelvis (image 70). A 2 x 4.4 cm collection in the low pelvis (image 84). A 2.5 x 2.8 cm ill-defined interloop collection (image 67). There is no evidence of pneumoperitoneum or bowel obstruction. Vascular/Lymphatic: Aortic atherosclerotic calcifications noted without aneurysm. Reproductive: Unremarkable Other: Small amount of ascites is noted. Musculoskeletal: No acute or suspicious abnormalities. IMPRESSION: Developing collections/abscesses within the mesentery, right paracolic gutter and pelvis as described. Dominant 5 cm abscess within the central pelvis now primarily contains gas. No evidence of pneumoperitoneum or bowel obstruction. New trace bilateral pleural effusions, bibasilar atelectasis and small amount of ascites. Cholelithiasis. Abdominal aortic atherosclerosis. Electronically Signed   By: Margarette Canada M.D.   On: 05/21/2016 15:13     Scheduled Meds: . atenolol  50 mg Oral Daily  . feeding supplement  1 Container Oral TID BM  . heparin  5,000 Units Subcutaneous Q8H  . HYDROcodone-acetaminophen  2 tablet Oral QID  . insulin aspart  0-9 Units Subcutaneous TID WC  . piperacillin-tazobactam (ZOSYN)  IV  3.375 g Intravenous Q8H  . simethicone  80 mg Oral QID  . sodium chloride flush  3 mL Intravenous Q12H   Continuous Infusions: . sodium  chloride 75 mL/hr at 05/22/16 0517     LOS: 6 days    Time spent in minutes: 4    Schurz, MD [facility] Pager:  [email or web address] Password [facility] 05/22/2016, 12:04 PM

## 2016-05-22 NOTE — Consult Note (Signed)
[place] Nurse consult requested for preoperative stoma site selection and marking. Patient is scheduled for operative procedure tomorrow.  Discussed surgical procedure and stoma creation with patient and family (son, wife and son's fiancee).  Explained role of the [place] nurse team.  Provided the patient with educational booklet.  Patient declines, but spouse may look at booklet tomorrow.  Answered patient and family questions.   Examined patient lying in bed. Rectus muscle is not palpable even with patient's head lifted. Abdomen is rotund, sore, firm. Attempted to place the marking in the patient's visual field, away from any creases or abdominal contour issues and within the rectus muscle.  Patient wears his underwear and pants well below the panus.  Marked for colostomy in the LUQ  8cm to the left of the umbilicus and 3cm above the umbilicus.  Patient's abdomen cleansed with CHG wipes at site markings, allowed to air dry prior to marking.Covered mark with thin film transparent dressing to preserve mark until tomorrow.   [place] nursing team , will remain available to this patient, the nursing, surgical and medical teams.  Please re-consult if an ostomy is created intraoperatively tomorrow. Thanks, Maudie Flakes, MSN, RN, [place], [facility]  Pager# [phone]

## 2016-05-22 NOTE — Progress Notes (Signed)
Subjective: Still tender RLQ to right mid abdomen area.  Again much better than on admit, but still there.  He is discouraged it has not improved more.    Objective: Vital signs in last 24 hours: Temp:  [97.7 F (36.5 C)-99.5 F (37.5 C)] 98.3 F (36.8 C) (07/31 0516) Pulse Rate:  [51-58] 57 (07/31 0516) Resp:  [20] 20 (07/31 0516) BP: (130-153)/(56-65) 152/56 (07/31 0516) SpO2:  [96 %-98 %] 96 % (07/31 0516) Last BM Date: 05/22/16 2400 IV Urine not recorded Stool x 2 Afebrile, VSS (Tm 99.5) NO labs since 05/20/16 Repeat CT yesterday:  Developing collections/abscesses within the mesentery, right paracolic gutter and pelvis as described. Dominant 5 cm abscess within the central pelvis now primarily contains gas. No evidence of pneumoperitoneum or bowel obstruction.   Intake/Output from previous day: 07/30 0701 - 07/31 0700 In: 2550 [I.V.:2400; IV Piggyback:150] Out: -  Intake/Output this shift: No intake/output data recorded.  General appearance: alert, cooperative and no distress Resp: clear to auscultation bilaterally GI: soft, still tender right mid to lower quadrant area, but much improved from the admit pain.  + BS and BM yesterday.  Lab Results:   Recent Labs  05/20/16 0542  WBC 10.1  HGB 10.9*  HCT 32.4*  PLT 275    BMET  Recent Labs  05/20/16 0542  NA 135  K 3.5  CL 103  CO2 24  GLUCOSE 174*  BUN 11  CREATININE 0.87  CALCIUM 8.2*   PT/INR No results for input(s): LABPROT, INR in the last 72 hours.   Recent Labs Lab 05/16/16 1031  AST 20  ALT 27  ALKPHOS 58  BILITOT 1.3*  PROT 8.0  ALBUMIN 4.0     Lipase     Component Value Date/Time   LIPASE 11 05/16/2016 1031     Studies/Results: Ct Abdomen Pelvis W Contrast  Result Date: 05/21/2016 CLINICAL DATA:  70 year old male -follow-up abdominal/pelvic abscesses. EXAM: CT ABDOMEN AND PELVIS WITH CONTRAST TECHNIQUE: Multidetector CT imaging of the abdomen and pelvis was performed  using the standard protocol following bolus administration of intravenous contrast. CONTRAST:  144mL ISOVUE-300 IOPAMIDOL (ISOVUE-300) INJECTION 61% COMPARISON:  05/16/2016 CT FINDINGS: Lower chest: Trace bilateral pleural effusions and bibasilar atelectasis are now noted. Hepatobiliary: The liver is unchanged. Cholelithiasis noted without CT evidence of acute cholecystitis. No biliary dilatation identified. Pancreas: Unremarkable Spleen: Unremarkable Adrenals/Urinary Tract: The 2 x 4 cm left adrenal adenoma is unchanged. The kidneys, right adrenal gland and bladder are unremarkable. Stomach/Bowel: The 5 cm collection/abscess within the central pelvis now primarily contains gas. Better defined focal fluid collections within the mesentery, right pericolic gutter and pelvis are identified as follows: A 1.9 x 2.8 cm right mesenteric collection (image 64). A 1.5 x 2.3 cm central pelvic mesenteric collection (image 65) A 2 x 4 cm right paracolic gutter collection (image 66) A 2.9 x 3.7 cm collection within the left pelvis (image 70). A 2 x 4.4 cm collection in the low pelvis (image 84). A 2.5 x 2.8 cm ill-defined interloop collection (image 67). There is no evidence of pneumoperitoneum or bowel obstruction. Vascular/Lymphatic: Aortic atherosclerotic calcifications noted without aneurysm. Reproductive: Unremarkable Other: Small amount of ascites is noted. Musculoskeletal: No acute or suspicious abnormalities. IMPRESSION: Developing collections/abscesses within the mesentery, right paracolic gutter and pelvis as described. Dominant 5 cm abscess within the central pelvis now primarily contains gas. No evidence of pneumoperitoneum or bowel obstruction. New trace bilateral pleural effusions, bibasilar atelectasis and small amount of ascites. Cholelithiasis.  Abdominal aortic atherosclerosis. Electronically Signed   By: Margarette Canada M.D.   On: 05/21/2016 15:13  Prior to Admission medications   Medication Sig Start Date End  Date Taking? Authorizing Provider  aspirin 81 MG tablet Take 81 mg by mouth daily.     Yes Historical Provider, MD  atenolol (TENORMIN) 50 MG tablet TAKE ONE & ONE-HALF TABLETS BY MOUTH ONCE DAILY Patient taking differently: TAKE ONE & ONE-HALF TABLETS =75mg  BY MOUTH ONCE DAILY 05/02/16  Yes [name], MD  atorvastatin (LIPITOR) 40 MG tablet TAKE ONE-HALF TABLET BY MOUTH ONCE DAILY Patient taking differently: TAKE ONE-HALF TABLET= 20mg  BY MOUTH ONCE DAILY 05/02/16  Yes [name], MD  brimonidine (ALPHAGAN) 0.2 % ophthalmic solution Place 1 drop into both eyes 2 (two) times daily.    Yes Historical Provider, MD  clopidogrel (PLAVIX) 75 MG tablet TAKE ONE TABLET BY MOUTH ONCE DAILY 05/02/16  Yes [name], MD  Coenzyme Q10 200 MG capsule Take 200 mg by mouth daily.   Yes Historical Provider, MD  glimepiride (AMARYL) 2 MG tablet Take 1 tablet (2 mg total) by mouth daily with breakfast. 10/22/15  Yes [name], MD  Glucosamine-Chondroitin-Vit D3 1500-1200-800 MG-MG-UNIT PACK Take 1 tablet by mouth daily.   Yes Historical Provider, MD  latanoprost (XALATAN) 0.005 % ophthalmic solution Place 1 drop into both eyes at bedtime.    Yes Historical Provider, MD  lisinopril (PRINIVIL,ZESTRIL) 2.5 MG tablet Take 1 tablet (2.5 mg total) by mouth daily. 06/18/14  Yes Troy Sine, MD  metFORMIN (GLUCOPHAGE) 1000 MG tablet TAKE ONE TABLET BY MOUTH TWICE DAILY WITH MEALS 01/31/16  Yes [name], MD  Multiple Vitamin (MULTIVITAMIN) tablet Take 1 tablet by mouth daily.   Yes Historical Provider, MD  omega-3 acid ethyl esters (LOVAZA) 1 g capsule TAKE TWO CAPSULES BY MOUTH TWICE DAILY 11/15/15  Yes [name], MD  Turmeric 450 MG CAPS Take 1 capsule by mouth daily.   Yes Historical Provider, MD  fenofibrate 160 MG tablet Take 1 tablet (160 mg total) by mouth daily. Patient not taking: Reported on 05/16/2016 03/29/16   Troy Sine, MD     Medications: . atenolol  50 mg Oral  Daily  . feeding supplement  1 Container Oral TID BM  . heparin  5,000 Units Subcutaneous Q8H  . HYDROcodone-acetaminophen  2 tablet Oral QID  . insulin aspart  0-9 Units Subcutaneous TID WC  . piperacillin-tazobactam (ZOSYN)  IV  3.375 g Intravenous Q8H  . simethicone  80 mg Oral QID  . sodium chloride flush  3 mL Intravenous Q12H   . sodium chloride 75 mL/hr at 05/22/16 0517    CAD with 4 stents AODM OSA on CPAP HYPERTENSION  Assessment/Plan Pelvic abscess - likely diverticular in origin   -- continue antibiotics will most likely get repeat CT this weekend. Original CT on 05/16/16.  Repeat CT 05/21/16 - 6 small abscesses noted, and 5 Cm abscess noted IR with no window to drain FEN: iv FLUIDS/Clear liquids ID: day 4 Zosyn DVT: Heparin/SCD - Asprin/Plavix on hold  Plan:  Dr. Marcello Moores discussed the CT with him this AM and Dr. [name] is to review and will make further recommendations, I have ordered labs for this AM.     LOS: 6 days    Shaquia Berkley 05/22/2016 [phone]

## 2016-05-23 ENCOUNTER — Inpatient Hospital Stay (HOSPITAL_COMMUNITY): Payer: PPO | Admitting: Registered Nurse

## 2016-05-23 ENCOUNTER — Encounter (HOSPITAL_COMMUNITY)
Admission: EM | Disposition: A | Discharge status: Home Health | Payer: Self-pay | Source: Home / Self Care | Attending: Internal Medicine

## 2016-05-23 ENCOUNTER — Encounter (HOSPITAL_COMMUNITY): Payer: Self-pay

## 2016-05-23 DIAGNOSIS — K5720 Diverticulitis of large intestine with perforation and abscess without bleeding: Secondary | ICD-10-CM

## 2016-05-23 DIAGNOSIS — K5792 Diverticulitis of intestine, part unspecified, without perforation or abscess without bleeding: Secondary | ICD-10-CM | POA: Diagnosis not present

## 2016-05-23 DIAGNOSIS — E1165 Type 2 diabetes mellitus with hyperglycemia: Secondary | ICD-10-CM | POA: Diagnosis not present

## 2016-05-23 DIAGNOSIS — E785 Hyperlipidemia, unspecified: Secondary | ICD-10-CM | POA: Diagnosis not present

## 2016-05-23 DIAGNOSIS — K651 Peritoneal abscess: Secondary | ICD-10-CM | POA: Diagnosis not present

## 2016-05-23 DIAGNOSIS — A419 Sepsis, unspecified organism: Secondary | ICD-10-CM | POA: Diagnosis not present

## 2016-05-23 DIAGNOSIS — E118 Type 2 diabetes mellitus with unspecified complications: Secondary | ICD-10-CM | POA: Diagnosis not present

## 2016-05-23 DIAGNOSIS — I2510 Atherosclerotic heart disease of native coronary artery without angina pectoris: Secondary | ICD-10-CM | POA: Diagnosis not present

## 2016-05-23 DIAGNOSIS — E46 Unspecified protein-calorie malnutrition: Secondary | ICD-10-CM | POA: Diagnosis not present

## 2016-05-23 DIAGNOSIS — R188 Other ascites: Secondary | ICD-10-CM | POA: Diagnosis not present

## 2016-05-23 DIAGNOSIS — I10 Essential (primary) hypertension: Secondary | ICD-10-CM | POA: Diagnosis not present

## 2016-05-23 DIAGNOSIS — I252 Old myocardial infarction: Secondary | ICD-10-CM | POA: Diagnosis not present

## 2016-05-23 DIAGNOSIS — K567 Ileus, unspecified: Secondary | ICD-10-CM | POA: Diagnosis not present

## 2016-05-23 DIAGNOSIS — E8770 Fluid overload, unspecified: Secondary | ICD-10-CM | POA: Diagnosis not present

## 2016-05-23 DIAGNOSIS — G4733 Obstructive sleep apnea (adult) (pediatric): Secondary | ICD-10-CM | POA: Diagnosis not present

## 2016-05-23 DIAGNOSIS — K5732 Diverticulitis of large intestine without perforation or abscess without bleeding: Secondary | ICD-10-CM | POA: Diagnosis not present

## 2016-05-23 HISTORY — DX: Diverticulitis of large intestine with perforation and abscess without bleeding: K57.20

## 2016-05-23 HISTORY — PX: COLOSTOMY: SHX63

## 2016-05-23 HISTORY — PX: LAPAROTOMY: SHX154

## 2016-05-23 HISTORY — PX: INCISION AND DRAINAGE ABSCESS: SHX5864

## 2016-05-23 HISTORY — PX: BOWEL RESECTION: SHX1257

## 2016-05-23 LAB — TYPE AND SCREEN
ABO/RH(D): O POS
Antibody Screen: NEGATIVE

## 2016-05-23 LAB — GLUCOSE, CAPILLARY
GLUCOSE-CAPILLARY: 201 mg/dL — AB (ref 65–99)
GLUCOSE-CAPILLARY: 205 mg/dL — AB (ref 65–99)
GLUCOSE-CAPILLARY: 189 mg/dL — AB (ref 65–99)
Glucose-Capillary: 154 mg/dL — ABNORMAL HIGH (ref 65–99)

## 2016-05-23 LAB — SURGICAL PCR SCREEN
MRSA, PCR: POSITIVE — AB
Staphylococcus aureus: POSITIVE — AB

## 2016-05-23 LAB — ABO/RH: ABO/RH(D): O POS

## 2016-05-23 SURGERY — LAPAROTOMY, EXPLORATORY
Anesthesia: General | Site: Abdomen

## 2016-05-23 MED ORDER — PIPERACILLIN-TAZOBACTAM 3.375 G IVPB
3.3750 g | Freq: Three times a day (TID) | INTRAVENOUS | Status: DC
  Administered 2016-05-23 – 2016-06-01 (×26): 3.375 g via INTRAVENOUS
  Filled 2016-05-23 (×27): qty 50

## 2016-05-23 MED ORDER — METOPROLOL TARTRATE 5 MG/5ML IV SOLN
5.0000 mg | Freq: Four times a day (QID) | INTRAVENOUS | Status: DC
  Administered 2016-05-23 – 2016-05-26 (×12): 5 mg via INTRAVENOUS
  Filled 2016-05-23 (×13): qty 5

## 2016-05-23 MED ORDER — ONDANSETRON HCL 4 MG/2ML IJ SOLN
4.0000 mg | Freq: Once | INTRAMUSCULAR | Status: DC | PRN
Start: 2016-05-23 — End: 2016-05-23

## 2016-05-23 MED ORDER — ONDANSETRON HCL 4 MG/2ML IJ SOLN
INTRAMUSCULAR | Status: AC
  Filled 2016-05-23: qty 2

## 2016-05-23 MED ORDER — INSULIN ASPART 100 UNIT/ML ~~LOC~~ SOLN
0.0000 [IU] | SUBCUTANEOUS | Status: DC
  Administered 2016-05-23: 5 [IU] via SUBCUTANEOUS
  Administered 2016-05-23 – 2016-05-24 (×2): 3 [IU] via SUBCUTANEOUS
  Administered 2016-05-24 (×4): 5 [IU] via SUBCUTANEOUS
  Administered 2016-05-24: 3 [IU] via SUBCUTANEOUS
  Administered 2016-05-25: 5 [IU] via SUBCUTANEOUS
  Administered 2016-05-25: 8 [IU] via SUBCUTANEOUS

## 2016-05-23 MED ORDER — LACTATED RINGERS IV SOLN
INTRAVENOUS | Status: DC | PRN
  Administered 2016-05-23: 09:00:00 via INTRAVENOUS

## 2016-05-23 MED ORDER — PROPOFOL 10 MG/ML IV BOLUS
INTRAVENOUS | Status: AC
  Filled 2016-05-23: qty 40

## 2016-05-23 MED ORDER — ONDANSETRON HCL 4 MG/2ML IJ SOLN: 4.0000 mg | Freq: Four times a day (QID) | INTRAMUSCULAR | Status: DC | PRN

## 2016-05-23 MED ORDER — HYDROMORPHONE HCL 1 MG/ML IJ SOLN
INTRAMUSCULAR | Status: AC
  Administered 2016-05-23: 0.5 mg via INTRAVENOUS
  Filled 2016-05-23: qty 1

## 2016-05-23 MED ORDER — 0.9 % SODIUM CHLORIDE (POUR BTL) OPTIME
TOPICAL | Status: DC | PRN
  Administered 2016-05-23: 2000 mL
  Administered 2016-05-23: 1000 mL

## 2016-05-23 MED ORDER — HYDROMORPHONE HCL 1 MG/ML IJ SOLN
0.2500 mg | INTRAMUSCULAR | Status: DC | PRN
  Administered 2016-05-23 (×3): 0.5 mg via INTRAVENOUS

## 2016-05-23 MED ORDER — HYDROMORPHONE HCL 1 MG/ML IJ SOLN
0.2500 mg | INTRAMUSCULAR | Status: DC | PRN
Start: 2016-05-23 — End: 2016-05-23
  Administered 2016-05-23: 0.5 mg via INTRAVENOUS

## 2016-05-23 MED ORDER — HYDROMORPHONE HCL 2 MG/ML IJ SOLN
INTRAMUSCULAR | Status: AC
  Filled 2016-05-23: qty 1

## 2016-05-23 MED ORDER — FAMOTIDINE IN NACL 20-0.9 MG/50ML-% IV SOLN
20.0000 mg | Freq: Two times a day (BID) | INTRAVENOUS | Status: DC
Start: 2016-05-23 — End: 2016-05-31
  Administered 2016-05-23 – 2016-05-31 (×16): 20 mg via INTRAVENOUS
  Filled 2016-05-23 (×17): qty 50

## 2016-05-23 MED ORDER — ALBUMIN HUMAN 5 % IV SOLN
INTRAVENOUS | Status: AC
  Filled 2016-05-23: qty 250

## 2016-05-23 MED ORDER — LABETALOL HCL 5 MG/ML IV SOLN
INTRAVENOUS | Status: AC
  Administered 2016-05-23: 5 mg via INTRAVENOUS
  Filled 2016-05-23: qty 4

## 2016-05-23 MED ORDER — NALOXONE HCL 0.4 MG/ML IJ SOLN: 0.4000 mg | INTRAMUSCULAR | Status: DC | PRN

## 2016-05-23 MED ORDER — DIPHENHYDRAMINE HCL 12.5 MG/5ML PO ELIX: 12.5000 mg | ORAL_SOLUTION | Freq: Four times a day (QID) | ORAL | Status: DC | PRN

## 2016-05-23 MED ORDER — LACTATED RINGERS IV SOLN: INTRAVENOUS | Status: DC

## 2016-05-23 MED ORDER — LABETALOL HCL 5 MG/ML IV SOLN
INTRAVENOUS | Status: DC | PRN
  Administered 2016-05-23 (×2): 2.5 mg via INTRAVENOUS
  Administered 2016-05-23: 5 mg via INTRAVENOUS
  Administered 2016-05-23: 2.5 mg via INTRAVENOUS
  Administered 2016-05-23: 5 mg via INTRAVENOUS

## 2016-05-23 MED ORDER — LABETALOL HCL 5 MG/ML IV SOLN
INTRAVENOUS | Status: AC
  Filled 2016-05-23: qty 4

## 2016-05-23 MED ORDER — SUCCINYLCHOLINE CHLORIDE 20 MG/ML IJ SOLN
INTRAMUSCULAR | Status: DC | PRN
  Administered 2016-05-23: 100 mg via INTRAVENOUS

## 2016-05-23 MED ORDER — PROPOFOL 10 MG/ML IV BOLUS
INTRAVENOUS | Status: DC | PRN
  Administered 2016-05-23: 50 mg via INTRAVENOUS
  Administered 2016-05-23: 100 mg via INTRAVENOUS

## 2016-05-23 MED ORDER — SUGAMMADEX SODIUM 500 MG/5ML IV SOLN
INTRAVENOUS | Status: DC | PRN
  Administered 2016-05-23: 300 mg via INTRAVENOUS

## 2016-05-23 MED ORDER — HYDROMORPHONE HCL 1 MG/ML IJ SOLN
INTRAMUSCULAR | Status: DC | PRN
  Administered 2016-05-23 (×4): 0.5 mg via INTRAVENOUS

## 2016-05-23 MED ORDER — MIDAZOLAM HCL 2 MG/2ML IJ SOLN
INTRAMUSCULAR | Status: AC
  Filled 2016-05-23: qty 2

## 2016-05-23 MED ORDER — KCL IN DEXTROSE-NACL 20-5-0.45 MEQ/L-%-% IV SOLN
INTRAVENOUS | Status: DC
  Administered 2016-05-23 – 2016-05-24 (×2): via INTRAVENOUS
  Filled 2016-05-23 (×2): qty 1000

## 2016-05-23 MED ORDER — FENTANYL CITRATE (PF) 100 MCG/2ML IJ SOLN
INTRAMUSCULAR | Status: AC
  Filled 2016-05-23: qty 2

## 2016-05-23 MED ORDER — DIPHENHYDRAMINE HCL 50 MG/ML IJ SOLN: 12.5000 mg | Freq: Four times a day (QID) | INTRAMUSCULAR | Status: DC | PRN

## 2016-05-23 MED ORDER — ONDANSETRON HCL 4 MG/2ML IJ SOLN
INTRAMUSCULAR | Status: DC | PRN
  Administered 2016-05-23: 4 mg via INTRAVENOUS

## 2016-05-23 MED ORDER — SODIUM CHLORIDE 0.9% FLUSH: 9.0000 mL | INTRAVENOUS | Status: DC | PRN

## 2016-05-23 MED ORDER — MEPERIDINE HCL 50 MG/ML IJ SOLN: 6.2500 mg | INTRAMUSCULAR | Status: DC | PRN

## 2016-05-23 MED ORDER — MIDAZOLAM HCL 2 MG/2ML IJ SOLN
1.0000 mg | Freq: Once | INTRAMUSCULAR | Status: AC
  Administered 2016-05-23: 1 mg via INTRAVENOUS

## 2016-05-23 MED ORDER — LABETALOL HCL 5 MG/ML IV SOLN
5.0000 mg | Freq: Once | INTRAVENOUS | Status: AC
  Administered 2016-05-23: 5 mg via INTRAVENOUS

## 2016-05-23 MED ORDER — ONDANSETRON HCL 4 MG/2ML IJ SOLN: 4.0000 mg | Freq: Once | INTRAMUSCULAR | Status: DC | PRN

## 2016-05-23 MED ORDER — HYDROMORPHONE HCL 2 MG/ML IJ SOLN
2.0000 mg | Freq: Once | INTRAMUSCULAR | Status: AC
  Administered 2016-05-23: 2 mg via INTRAVENOUS

## 2016-05-23 MED ORDER — LIDOCAINE HCL (CARDIAC) 20 MG/ML IV SOLN
INTRAVENOUS | Status: DC | PRN
  Administered 2016-05-23: 100 mg via INTRAVENOUS

## 2016-05-23 MED ORDER — ALBUMIN HUMAN 5 % IV SOLN
INTRAVENOUS | Status: DC | PRN
  Administered 2016-05-23: 11:00:00 via INTRAVENOUS

## 2016-05-23 MED ORDER — LIDOCAINE HCL (CARDIAC) 20 MG/ML IV SOLN
INTRAVENOUS | Status: AC
  Filled 2016-05-23: qty 5

## 2016-05-23 MED ORDER — MIDAZOLAM HCL 5 MG/5ML IJ SOLN
INTRAMUSCULAR | Status: DC | PRN
  Administered 2016-05-23: 2 mg via INTRAVENOUS

## 2016-05-23 MED ORDER — SUGAMMADEX SODIUM 500 MG/5ML IV SOLN
INTRAVENOUS | Status: AC
  Filled 2016-05-23: qty 5

## 2016-05-23 MED ORDER — HYDROMORPHONE HCL 1 MG/ML IJ SOLN
0.5000 mg | INTRAMUSCULAR | Status: AC | PRN
  Administered 2016-05-23 (×2): 0.5 mg via INTRAVENOUS

## 2016-05-23 MED ORDER — ROCURONIUM BROMIDE 100 MG/10ML IV SOLN
INTRAVENOUS | Status: AC
  Filled 2016-05-23: qty 1

## 2016-05-23 MED ORDER — ONDANSETRON HCL 4 MG PO TABS: 4.0000 mg | ORAL_TABLET | Freq: Four times a day (QID) | ORAL | Status: DC | PRN

## 2016-05-23 MED ORDER — FENTANYL CITRATE (PF) 250 MCG/5ML IJ SOLN
INTRAMUSCULAR | Status: AC
  Filled 2016-05-23: qty 5

## 2016-05-23 MED ORDER — HEPARIN SODIUM (PORCINE) 5000 UNIT/ML IJ SOLN
5000.0000 [IU] | Freq: Three times a day (TID) | INTRAMUSCULAR | Status: DC
  Administered 2016-05-24 – 2016-05-29 (×14): 5000 [IU] via SUBCUTANEOUS
  Filled 2016-05-23 (×15): qty 1

## 2016-05-23 MED ORDER — FENTANYL CITRATE (PF) 100 MCG/2ML IJ SOLN
INTRAMUSCULAR | Status: DC | PRN
  Administered 2016-05-23: 50 ug via INTRAVENOUS
  Administered 2016-05-23 (×2): 100 ug via INTRAVENOUS
  Administered 2016-05-23: 150 ug via INTRAVENOUS
  Administered 2016-05-23: 50 ug via INTRAVENOUS

## 2016-05-23 MED ORDER — MORPHINE SULFATE 2 MG/ML IV SOLN
INTRAVENOUS | Status: DC
  Administered 2016-05-23: 2 mg via INTRAVENOUS
  Administered 2016-05-23: 18 mg via INTRAVENOUS
  Administered 2016-05-24: 03:00:00 via INTRAVENOUS
  Filled 2016-05-23 (×2): qty 25

## 2016-05-23 MED ORDER — ROCURONIUM BROMIDE 100 MG/10ML IV SOLN
INTRAVENOUS | Status: DC | PRN
  Administered 2016-05-23: 10 mg via INTRAVENOUS
  Administered 2016-05-23: 50 mg via INTRAVENOUS

## 2016-05-23 SURGICAL SUPPLY — 78 items
APPLICATOR COTTON TIP 6IN STRL (MISCELLANEOUS) IMPLANT
BLADE EXTENDED COATED 6.5IN (ELECTRODE) ×3 IMPLANT
BLADE HEX COATED 2.75 (ELECTRODE) ×3 IMPLANT
BLADE SURG 15 STRL LF DISP TIS (BLADE) ×2 IMPLANT
BLADE SURG 15 STRL SS (BLADE) ×1
BLADE SURG SZ10 CARB STEEL (BLADE) ×3 IMPLANT
BNDG GAUZE ELAST 4 BULKY (GAUZE/BANDAGES/DRESSINGS) IMPLANT
CLIP TI LARGE 6 (CLIP) IMPLANT
COVER MAYO STAND STRL (DRAPES) ×3 IMPLANT
COVER SURGICAL LIGHT HANDLE (MISCELLANEOUS) ×6 IMPLANT
DECANTER SPIKE VIAL GLASS SM (MISCELLANEOUS) IMPLANT
DRAIN CHANNEL 19F RND (DRAIN) ×3 IMPLANT
DRAPE LAPAROSCOPIC ABDOMINAL (DRAPES) ×3 IMPLANT
DRAPE SHEET LG 3/4 BI-LAMINATE (DRAPES) IMPLANT
DRAPE UTILITY XL STRL (DRAPES) ×3 IMPLANT
DRAPE WARM FLUID 44X44 (DRAPE) ×3 IMPLANT
DRSG PAD ABDOMINAL 8X10 ST (GAUZE/BANDAGES/DRESSINGS) ×3 IMPLANT
ELECT PENCIL ROCKER SW 15FT (MISCELLANEOUS) ×3 IMPLANT
ELECT REM PT RETURN 9FT ADLT (ELECTROSURGICAL) ×3
ELECTRODE REM PT RTRN 9FT ADLT (ELECTROSURGICAL) ×2 IMPLANT
EVACUATOR DRAINAGE 10X20 100CC (DRAIN) IMPLANT
EVACUATOR SILICONE 100CC (DRAIN) ×3 IMPLANT
GAUZE SPONGE 4X4 12PLY STRL (GAUZE/BANDAGES/DRESSINGS) ×3 IMPLANT
GLOVE BIO SURGEON STRL SZ7.5 (GLOVE) ×3 IMPLANT
GLOVE BIOGEL PI IND STRL 7.0 (GLOVE) ×2 IMPLANT
GLOVE BIOGEL PI INDICATOR 7.0 (GLOVE) ×1
GLOVE ECLIPSE 8.0 STRL XLNG CF (GLOVE) ×6 IMPLANT
GLOVE INDICATOR 8.0 STRL GRN (GLOVE) ×6 IMPLANT
GOWN STRL REUS W/TWL LRG LVL3 (GOWN DISPOSABLE) ×6 IMPLANT
GOWN STRL REUS W/TWL XL LVL3 (GOWN DISPOSABLE) ×6 IMPLANT
HANDLE SUCTION POOLE (INSTRUMENTS) ×2 IMPLANT
KIT BASIN OR (CUSTOM PROCEDURE TRAY) ×3 IMPLANT
LEGGING LITHOTOMY PAIR STRL (DRAPES) IMPLANT
LIGASURE IMPACT 36 18CM CVD LR (INSTRUMENTS) ×3 IMPLANT
NEEDLE HYPO 25X1 1.5 SAFETY (NEEDLE) IMPLANT
NS IRRIG 1000ML POUR BTL (IV SOLUTION) ×6 IMPLANT
PACK COLON (CUSTOM PROCEDURE TRAY) ×3 IMPLANT
PACK GENERAL/GYN (CUSTOM PROCEDURE TRAY) IMPLANT
SHEARS HARMONIC ACE PLUS 36CM (ENDOMECHANICALS) IMPLANT
SPONGE DRAIN TRACH 4X4 STRL 2S (GAUZE/BANDAGES/DRESSINGS) ×3 IMPLANT
SPONGE LAP 18X18 X RAY DECT (DISPOSABLE) ×6 IMPLANT
STAPLER CUT CVD 40MM GREEN (STAPLE) ×3 IMPLANT
STAPLER PROXIMATE 75MM BLUE (STAPLE) ×3 IMPLANT
STAPLER VISISTAT 35W (STAPLE) ×3 IMPLANT
SUCTION POOLE HANDLE (INSTRUMENTS) ×3
SUT ETHILON 3 0 PS 1 (SUTURE) ×3 IMPLANT
SUT MNCRL AB 4-0 PS2 18 (SUTURE) IMPLANT
SUT NOV 1 T60/GS (SUTURE) ×6 IMPLANT
SUT NOVA T20/GS 25 (SUTURE) ×6 IMPLANT
SUT PDS AB 1 CTX 36 (SUTURE) ×6 IMPLANT
SUT PDS AB 1 TP1 54 (SUTURE) IMPLANT
SUT PROLENE 2 0 BLUE (SUTURE) ×3 IMPLANT
SUT PROLENE 2 0 KS (SUTURE) IMPLANT
SUT SILK 2 0 (SUTURE) ×3
SUT SILK 2 0 SH CR/8 (SUTURE) ×6 IMPLANT
SUT SILK 2 0SH CR/8 30 (SUTURE) IMPLANT
SUT SILK 2-0 18XBRD TIE 12 (SUTURE) ×2 IMPLANT
SUT SILK 2-0 30XBRD TIE 12 (SUTURE) ×2 IMPLANT
SUT SILK 3 0 (SUTURE) ×1
SUT SILK 3 0 SH CR/8 (SUTURE) ×6 IMPLANT
SUT SILK 3-0 18XBRD TIE 12 (SUTURE) ×2 IMPLANT
SUT VIC AB 2-0 SH 18 (SUTURE) ×3 IMPLANT
SUT VIC AB 3-0 SH 18 (SUTURE) ×6 IMPLANT
SUT VIC AB 3-0 SH 27 (SUTURE)
SUT VIC AB 3-0 SH 27XBRD (SUTURE) IMPLANT
SUT VICRYL 2 0 18  UND BR (SUTURE)
SUT VICRYL 2 0 18 UND BR (SUTURE) IMPLANT
SWAB COLLECTION DEVICE MRSA (MISCELLANEOUS) IMPLANT
SWAB CULTURE ESWAB REG 1ML (MISCELLANEOUS) IMPLANT
SYR BULB 3OZ (MISCELLANEOUS) ×3 IMPLANT
SYR CONTROL 10ML LL (SYRINGE) IMPLANT
TAPE CLOTH SURG 6X10 WHT LF (GAUZE/BANDAGES/DRESSINGS) ×3 IMPLANT
TOWEL OR 17X26 10 PK STRL BLUE (TOWEL DISPOSABLE) ×6 IMPLANT
TOWEL OR NON WOVEN STRL DISP B (DISPOSABLE) ×3 IMPLANT
TRAY FOLEY W/METER SILVER 14FR (SET/KITS/TRAYS/PACK) IMPLANT
TRAY FOLEY W/METER SILVER 16FR (SET/KITS/TRAYS/PACK) ×3 IMPLANT
WATER STERILE IRR 1500ML POUR (IV SOLUTION) IMPLANT
YANKAUER SUCT BULB TIP NO VENT (SUCTIONS) ×3 IMPLANT

## 2016-05-23 NOTE — Transfer of Care (Signed)
Immediate Anesthesia Transfer of Care Note  Patient: Evan Avery  Procedure(s) Performed: Procedure(s): EXPLORATORY LAPAROTOMY (N/A) SMALL BOWEL RESECTION (N/A) DRAINAGE OF MULTIPLE INTRA  ABDOMINAL ABSCESS (N/A) COLOSTOMY (Left)  Patient Location: PACU  Anesthesia Type:General  Level of Consciousness: awake, alert , oriented and patient cooperative  Airway & Oxygen Therapy: Patient Spontanous Breathing and Patient connected to face mask oxygen  Post-op Assessment: Report given to RN, Post -op Vital signs reviewed and stable and Patient moving all extremities  Post vital signs: Reviewed and stable  Last Vitals:  Vitals:   05/22/16 2254 05/23/16 0558  BP: (!) 147/59 (!) 161/66  Pulse: (!) 50 (!) 56  Resp: 16 16  Temp: 36.9 C 37.2 C    Last Pain:  Vitals:   05/23/16 0826  TempSrc:   PainSc: 8       Patients Stated Pain Goal: 3 ([id number])  Complications: No apparent anesthesia complications

## 2016-05-23 NOTE — Anesthesia Procedure Notes (Signed)
Procedure Name: Intubation Date/Time: 05/23/2016 9:36 AM Performed by: Carleene Cooper A Pre-anesthesia Checklist: Patient identified, Timeout performed, Emergency Drugs available and Suction available Patient Re-evaluated:Patient Re-evaluated prior to inductionOxygen Delivery Method: Circle system utilized Preoxygenation: Pre-oxygenation with 100% oxygen Intubation Type: IV induction, Cricoid Pressure applied and Rapid sequence Ventilation: Mask ventilation without difficulty Laryngoscope Size: Glidescope and 3 Grade View: Grade II Tube type: Glide rite Tube size: 7.5 mm Number of attempts: 3 Airway Equipment and Method: Video-laryngoscopy Placement Confirmation: ETT inserted through vocal cords under direct vision,  positive ETCO2 and breath sounds checked- equal and bilateral Secured at: 22 cm Tube secured with: Tape Dental Injury: Dental damage  Difficulty Due To: Difficulty was unanticipated and Difficult Airway- due to immobile epiglottis Future Recommendations: Recommend- induction with short-acting agent, and alternative techniques readily available Comments: RSI with cricoid pressure by Dr. Conrad Blackwater. DL x 1 by CRNA with MAC 4. Unable to see under the epiglottis. Dr. Conrad Turpin aware. DL x 1 by Dr. Conrad Argyle.  - ETCO2. ETT removed. Masked. Glidescope present and ready. Dr. Conrad [place] noted upon opening his mouth Right upper incisor became dislodged. Tooth removed from the mouth by Dr. Conrad Early. DL x 1 by MDA with Glidescope #3. Grade 1 view. +ETCO2. BBS=.

## 2016-05-23 NOTE — Progress Notes (Signed)
Having more abdominal pain overnight.  To OR today.

## 2016-05-23 NOTE — Op Note (Signed)
Operative Note  Evan Avery male 70 y.o. 05/23/2016  PREOPERATIVE DX:  Perforated sigmoid diverticulitis with multiple intra-abdominal abscesses.  POSTOPERATIVE DX:  Same  PROCEDURE:   Exploratory laparotomy, drainage of multiple intra-abdominal abscesses, sigmoid colectomy,colostomy.         Surgeon: [name]    Anesthesia: General endotracheal anesthesia  Indications:   This is a 70 year old male admitted with complicated sigmoid diverticulitis over a week ago. He had a 5 cm intramesenteric abscess.He is placed on intravenous antibiotics. Recent CT as of yesterday demonstrated no resolution of the abscess and 5 new abscesses. Because of this exploratory laparotomy was recommended and now he presents for that.    Procedure Detail:  He was brought to the operating room placed supine on the operating table and general anesthetic was given. A Foley catheter was inserted. A nasogastric tube was inserted. The hair on the abdominal wall was clipped. Previous colostomy mark on the left side was preserved. The abdominal wall was widely sterilely prepped and draped. A timeout was performed.  A midline incision was made and carried down through all layers beginning above the umbilicus and extending to the pubis. Once entering the peritoneal cavity, thin cloudy ascites was encountered and evacuated. I dissected the omentum free from the abdominal wall. Multiple thickened dilated loops of small bowel were encountered. I began running the small bowel and drained multiple interloop abscesses all the way down to the ileocecal valve. No enterotomies were made.   Next, I concentrated on the pelvis. I began mobilizing some of the distal descending colon. There was a thick inflammatory segment of sigmoid colon tracking toward the right side of the abdomen. I mobilized this and broke into the large 5 cm abscess cavity which was walled off between the mesial colon and the small bowel mesentery.   The abscess cavity was drained. I then identified: Distal to the involved segment that was soft and normal appearing. It appeared to be close to the rectosigmoid junction. I mobilized more the descending colon by dividing its lateral attachments. The left ureter was identified. The plane of dissection was kept well anterior and medial to this.  Using the linear cutting stapler, I divided the colon at the distal descending colon area which was a soft normal piece of colon. I then divided distal colon near the rectosigmoid junction. The mesentery was divided close to the colon wall using the LigaSure. The proximal portion of the segment was marked and handed off the field as sigmoid colon. I then marked the rectal stump with a 2-0 silk suture and a 2-0 Prolene suture.  I then made a circular incision in the skin and subcutaneous tissue of the left abdominal wall at the previously marked site. A cruciate incision was made in the anterior posterior fascia. The distal rectal stump was then brought up through this incision. Some of the colon wasn't anchored to the anterior fascia with interrupted 2-0 Vicryl sutures.  The abdominal cavities and copiously irrigated out with warm saline solution. I ran the small bowel and noted no evidence of enterotomy. No further abscesses were noted. A stab incision was made in the right lower quadrant. A size 19 Blake drain was then placed into the pelvis to this incision and anchored to the skin with a 3-0 nylon suture.  Irrigation fluid was evacuated. Abdominal cavity was inspected and there is no evidence of bleeding or organ injury.  Midline fascia was closed with a running #1 Novafil suture. Wound was  then covered with a towel. The colostomy was then matured with interrupted 3-0 Vicryl sutures. A colostomy appliance was placed. Midline wound subcutaneous tissue was packed with saline moistened gauze followed by a dry dressing.  He tolerated the procedure well without any  apparent complications. He was taken to the recovery room in satisfactory condition. Estimated blood loss was approximated 650 cc.         Drains: # 79 Blake drain in pelvis                 Specimens: sigmoid colon        Complications:  * No complications entered in OR log *         Disposition: PACU - hemodynamically stable.         Condition: stable

## 2016-05-23 NOTE — Anesthesia Postprocedure Evaluation (Signed)
Anesthesia Post Note  Patient: Evan Avery  Procedure(s) Performed: Procedure(s) (LRB): EXPLORATORY LAPAROTOMY (N/A) SMALL BOWEL RESECTION (N/A) DRAINAGE OF MULTIPLE INTRA  ABDOMINAL ABSCESS (N/A) COLOSTOMY (Left)  Patient location during evaluation: PACU Anesthesia Type: General Level of consciousness: awake and alert Pain management: pain level controlled Vital Signs Assessment: post-procedure vital signs reviewed and stable Respiratory status: spontaneous breathing, nonlabored ventilation, respiratory function stable and patient connected to nasal cannula oxygen Cardiovascular status: blood pressure returned to baseline and stable Postop Assessment: no signs of nausea or vomiting Anesthetic complications: no    Last Vitals:  Vitals:   05/23/16 1610 05/23/16 1620  BP: (!) 153/73 123/84  Pulse: 79 86  Resp: 15 10  Temp:      Last Pain:  Vitals:   05/23/16 1609  TempSrc: Oral  PainSc:      Spoke to patient in PACU about tooth loss. Will talk to him later in the ICU.            [place]

## 2016-05-23 NOTE — Anesthesia Preprocedure Evaluation (Addendum)
Anesthesia Evaluation  Patient identified by MRN, date of birth, ID band Patient awake    Reviewed: Allergy & Precautions, NPO status , Patient's Chart, lab work & pertinent test results  Airway Mallampati: I  TM Distance: >3 FB Neck ROM: Full    Dental   Pulmonary sleep apnea , Current Smoker,    Pulmonary exam normal        Cardiovascular hypertension, Pt. on medications + CAD, + Past MI and + Cardiac Stents  Normal cardiovascular exam     Neuro/Psych    GI/Hepatic   Endo/Other  diabetes  Renal/GU      Musculoskeletal   Abdominal   Peds  Hematology   Anesthesia Other Findings   Reproductive/Obstetrics                             Anesthesia Physical Anesthesia Plan  ASA: III  Anesthesia Plan: General   Post-op Pain Management:    Induction: Intravenous, Rapid sequence and Cricoid pressure planned  Airway Management Planned: Oral ETT  Additional Equipment: Arterial line  Intra-op Plan:   Post-operative Plan: Extubation in OR  Informed Consent: I have reviewed the patients History and Physical, chart, labs and discussed the procedure including the risks, benefits and alternatives for the proposed anesthesia with the patient or authorized representative who has indicated his/her understanding and acceptance.     Plan Discussed with: CRNA and Surgeon  Anesthesia Plan Comments:        Anesthesia Quick Evaluation

## 2016-05-23 NOTE — Progress Notes (Signed)
PROGRESS NOTE    Evan Avery  [id number] DOB: 17-Aug-1946 DOA: 05/16/2016  PCP: [name], MD   Brief Narrative:  70 y/o with h/o CAD and 4 stents, OSA on CPAP, DM, HTN, HLD who presents for abdominal pain & constipation. Found to have an intra-abdominal abscess on CT scan.   Subjective: No change in abdominal discomfort. Waiting to go to OR.  Assessment & Plan:   Principal Problem:   Intra-abdominal abscess - Sepsis  - Zosyn - IR unable to place drain as abscess is quite deep -  plan is to continue IV antibiotics with hopes to avoid surgery - cont clears - changed Vicodin to QID and cont Morphine PRN - ASA and plavix held due to possible need for surgery  - repeat CT shows that he has many new small abscesses-  Going to OR today   Active Problems: Constipation - no BM in 2 wks- resolved with dulcolax suppository- now having ongoing loose stools     CAD with stents - 4 stents in RCA - last stent placed in 2009 - last nuclear stress test in 8/12 was negative for reversible ischemia - holding ASA and Plavix for now until it is decided that he will not need abdominal surgery -no recent chest pain/ discomfort     Cough - clear lungs- not hyopoxic - Tessalon Perles     Diabetes mellitus type 2, uncontrolled - ISS- hold Metformin    OSA on CPAP - Cont CPAP     Essential hypertension - Atenolol- lisinopril on hold    Obesity, Class II, BMI 35-39.9, with comorbidity  Body mass index is 36.27 kg/m.    DVT prophylaxis: heparin Code Status: full code Family Communication: wife Disposition Plan: home when stable Consultants:   gen surgery  IR Procedures:    Antimicrobials:  Anti-infectives    Start     Dose/Rate Route Frequency Ordered Stop   05/16/16 2200  [MAR Hold]  piperacillin-tazobactam (ZOSYN) IVPB 3.375 g     (MAR Hold since 05/23/16 0905)   3.375 g 12.5 mL/hr over 240 Minutes Intravenous Every 8 hours 05/16/16 1710     05/16/16 1500   piperacillin-tazobactam (ZOSYN) IVPB 3.375 g     3.375 g 100 mL/hr over 30 Minutes Intravenous  Once 05/16/16 1435 05/16/16 1613   05/16/16 1430  piperacillin-tazobactam (ZOSYN) IVPB 4.5 g  Status:  Discontinued     4.5 g 200 mL/hr over 30 Minutes Intravenous  Once 05/16/16 1357 05/16/16 1435       Objective: Vitals:   05/22/16 1403 05/22/16 2254 05/23/16 0558 05/23/16 1230  BP: (!) 144/54 (!) 147/59 (!) 161/66 (!) 178/79  Pulse: (!) 50 (!) 50 (!) 56 80  Resp: 18 16 16    Temp: 98.2 F (36.8 C) 98.5 F (36.9 C) 99 F (37.2 C)   TempSrc: Oral Oral Axillary   SpO2: 98% 96% 96%   Weight:      Height:        Intake/Output Summary (Last 24 hours) at 05/23/16 1316 Last data filed at 05/23/16 1232  Gross per 24 hour  Intake             5000 ml  Output             1425 ml  Net             3575 ml   Filed Weights   05/16/16 1626 05/16/16 1706  Weight: 113.4 kg (250 lb) 108.2 kg (238  lb 8.6 oz)    Examination: General exam: Appears comfortable  HEENT: PERRLA, oral mucosa moist, no sclera icterus or thrush Respiratory system: Clear to auscultation. Respiratory effort normal. Cardiovascular system: S1 & S2 heard, RRR.  No murmurs  Gastrointestinal system: Abdomen soft, tender across lower abdomen,  Non-distended.  Normal bowel sound. No organomegaly Central nervous system: Alert and oriented. No focal neurological deficits. Extremities: No cyanosis, clubbing or edema Skin: No rashes or ulcers Psychiatry:  Mood & affect appropriate.     Data Reviewed: I have personally reviewed following labs and imaging studies  CBC:  Recent Labs Lab 05/17/16 0538 05/20/16 0542 05/22/16 1041  WBC 8.9 10.1 10.7*  HGB 10.9* 10.9* 10.6*  HCT 32.3* 32.4* 31.6*  MCV 94.2 93.6 94.3  PLT 203 275 [id number]   Basic Metabolic Panel:  Recent Labs Lab 05/17/16 0538 05/18/16 0500 05/20/16 0542 05/22/16 1041  NA 132* 134* 135 138  K 4.2 3.8 3.5 3.6  CL 104 102 103 105  CO2 20* 22 24 23     GLUCOSE 175* 154* 174* 151*  BUN 25* 18 11 13   CREATININE 1.14 1.09 0.87 0.84  CALCIUM 8.2* 8.4* 8.2* 8.3*   GFR: Estimated Creatinine Clearance: 97.6 mL/min (by C-G formula based on SCr of 0.84 mg/dL). Liver Function Tests:  Recent Labs Lab 05/22/16 1041  AST 28  ALT 22  ALKPHOS 72  BILITOT 0.9  PROT 5.7*  ALBUMIN 2.3*   No results for input(s): LIPASE, AMYLASE in the last 168 hours. No results for input(s): AMMONIA in the last 168 hours. Coagulation Profile:  Recent Labs Lab 05/16/16 1726  INR 1.33   Cardiac Enzymes: No results for input(s): CKTOTAL, CKMB, CKMBINDEX, TROPONINI in the last 168 hours. BNP (last 3 results) No results for input(s): PROBNP in the last 8760 hours. HbA1C: No results for input(s): HGBA1C in the last 72 hours. CBG:  Recent Labs Lab 05/22/16 1138 05/22/16 1626 05/22/16 2252 05/23/16 0728 05/23/16 1255  GLUCAP 160* 198* 155* 154* 201*   Lipid Profile: No results for input(s): CHOL, HDL, LDLCALC, TRIG, CHOLHDL, LDLDIRECT in the last 72 hours. Thyroid Function Tests: No results for input(s): TSH, T4TOTAL, FREET4, T3FREE, THYROIDAB in the last 72 hours. Anemia Panel: No results for input(s): VITAMINB12, FOLATE, FERRITIN, TIBC, IRON, RETICCTPCT in the last 72 hours. Urine analysis:    Component Value Date/Time   COLORURINE ORANGE (A) 05/16/2016 1223   APPEARANCEUR CLOUDY (A) 05/16/2016 1223   LABSPEC 1.034 (H) 05/16/2016 1223   PHURINE 5.5 05/16/2016 1223   GLUCOSEU >1000 (A) 05/16/2016 1223   HGBUR NEGATIVE 05/16/2016 1223   HGBUR negative 05/17/2007 0835   BILIRUBINUR SMALL (A) 05/16/2016 1223   BILIRUBINUR n 02/28/2011   KETONESUR 15 (A) 05/16/2016 1223   PROTEINUR 30 (A) 05/16/2016 1223   UROBILINOGEN 1.0 10/11/2011 0215   NITRITE POSITIVE (A) 05/16/2016 1223   LEUKOCYTESUR SMALL (A) 05/16/2016 1223   Sepsis Labs: @LABRCNTIP (procalcitonin:4,lacticidven:4) ) Recent Results (from the past 240 hour(s))  Urine culture      Status: Abnormal   Collection Time: 05/16/16 12:23 PM  Result Value Ref Range Status   Specimen Description URINE, CLEAN CATCH  Final   Special Requests NONE  Final   Culture MULTIPLE SPECIES PRESENT, SUGGEST RECOLLECTION (A)  Final   Report Status 05/17/2016 FINAL  Final  Culture, blood (Routine X 2) w Reflex to ID Panel     Status: None   Collection Time: 05/16/16  5:25 PM  Result Value Ref Range Status  Specimen Description BLOOD RIGHT ARM  Final   Special Requests BOTTLES DRAWN AEROBIC AND ANAEROBIC  10CC  Final   Culture   Final    NO GROWTH 5 DAYS Performed at [facility]    Report Status 05/21/2016 FINAL  Final  Culture, blood (Routine X 2) w Reflex to ID Panel     Status: None   Collection Time: 05/16/16  5:30 PM  Result Value Ref Range Status   Specimen Description BLOOD LEFT HAND  Final   Special Requests BOTTLES DRAWN AEROBIC AND ANAEROBIC  10CC  Final   Culture   Final    NO GROWTH 5 DAYS Performed at [facility]    Report Status 05/21/2016 FINAL  Final  Surgical pcr screen     Status: Abnormal   Collection Time: 05/23/16  7:59 AM  Result Value Ref Range Status   MRSA, PCR POSITIVE (A) NEGATIVE Final    Comment: RESULT CALLED TO, READ BACK BY AND VERIFIED WITH: [place] AT 1055 ON 08.01.17 BY SHUEA    Staphylococcus aureus POSITIVE (A) NEGATIVE Final    Comment:        The Xpert SA Assay (FDA approved for NASAL specimens in patients over 56 years of age), is one component of a comprehensive surveillance program.  Test performance has been validated by [facility] for patients greater than or equal to 82 year old. It is not intended to diagnose infection nor to guide or monitor treatment.          Radiology Studies: Ct Abdomen Pelvis W Contrast  Result Date: 05/21/2016 CLINICAL DATA:  70 year old male -follow-up abdominal/pelvic abscesses. EXAM: CT ABDOMEN AND PELVIS WITH CONTRAST TECHNIQUE: Multidetector CT imaging of the abdomen  and pelvis was performed using the standard protocol following bolus administration of intravenous contrast. CONTRAST:  170mL ISOVUE-300 IOPAMIDOL (ISOVUE-300) INJECTION 61% COMPARISON:  05/16/2016 CT FINDINGS: Lower chest: Trace bilateral pleural effusions and bibasilar atelectasis are now noted. Hepatobiliary: The liver is unchanged. Cholelithiasis noted without CT evidence of acute cholecystitis. No biliary dilatation identified. Pancreas: Unremarkable Spleen: Unremarkable Adrenals/Urinary Tract: The 2 x 4 cm left adrenal adenoma is unchanged. The kidneys, right adrenal gland and bladder are unremarkable. Stomach/Bowel: The 5 cm collection/abscess within the central pelvis now primarily contains gas. Better defined focal fluid collections within the mesentery, right pericolic gutter and pelvis are identified as follows: A 1.9 x 2.8 cm right mesenteric collection (image 64). A 1.5 x 2.3 cm central pelvic mesenteric collection (image 65) A 2 x 4 cm right paracolic gutter collection (image 66) A 2.9 x 3.7 cm collection within the left pelvis (image 70). A 2 x 4.4 cm collection in the low pelvis (image 84). A 2.5 x 2.8 cm ill-defined interloop collection (image 67). There is no evidence of pneumoperitoneum or bowel obstruction. Vascular/Lymphatic: Aortic atherosclerotic calcifications noted without aneurysm. Reproductive: Unremarkable Other: Small amount of ascites is noted. Musculoskeletal: No acute or suspicious abnormalities. IMPRESSION: Developing collections/abscesses within the mesentery, right paracolic gutter and pelvis as described. Dominant 5 cm abscess within the central pelvis now primarily contains gas. No evidence of pneumoperitoneum or bowel obstruction. New trace bilateral pleural effusions, bibasilar atelectasis and small amount of ascites. Cholelithiasis. Abdominal aortic atherosclerosis. Electronically Signed   By: Margarette Canada M.D.   On: 05/21/2016 15:13     Scheduled Meds: . [MAR Hold]  atenolol  50 mg Oral Daily  . [MAR Hold] feeding supplement  1 Container Oral TID BM  . [  MAR Hold] HYDROcodone-acetaminophen  2 tablet Oral QID  . HYDROmorphone      . [MAR Hold] insulin aspart  0-9 Units Subcutaneous TID WC  . morphine   Intravenous Q4H  . [MAR Hold] piperacillin-tazobactam (ZOSYN)  IV  3.375 g Intravenous Q8H  . [MAR Hold] simethicone  80 mg Oral QID  . [MAR Hold] sodium chloride flush  3 mL Intravenous Q12H   Continuous Infusions: . sodium chloride 75 mL/hr at 05/23/16 0603  . lactated ringers       LOS: 7 days    Time spent in minutes: 31    [place], MD [facility] Pager: [email or web address] Password [email or web address] 05/23/2016, 1:16 PM

## 2016-05-23 NOTE — Progress Notes (Signed)
Scheduled SQ Heparin held overnight in anticipation of patient having surgery today.  Will continue to monitor patient.

## 2016-05-23 NOTE — Progress Notes (Signed)
Nutrition Follow-up  DOCUMENTATION CODES:   Obesity unspecified  INTERVENTION:  - Diet advancement as medically feasible following surgery.  - Continue Boost Breeze TID at this time. - RD will continue to monitor for additional nutrition-related needs.  NUTRITION DIAGNOSIS:   Inadequate protein intake related to other (see comment) (current diet order) as evidenced by other (see comment) (CLD does not meet estimated protein needs.). -ongoing, pt now NPO for surgery today.  GOAL:   Patient will meet greater than or equal to 90% of their needs -unmet  MONITOR:   Diet advancement, PO intake, Supplement acceptance, Weight trends, Labs, Skin, I & O's  ASSESSMENT:    70 y.o. male with medical history significant of CAD with a stent in 1992, obesity and OSA came to the hospital complaining about abdominal pain. His symptoms started about 10 days ago, he also had obstipation and tried multiple OTC laxatives without success. About 45 days ago started to have chills and he came in this morning because of the pain and chills worsens. CT scan showed intra-abdominal abscess.  8/1 No new weight since admission. Pt on CLD since diet advancement on 7/26 and chart review indicates 100% completion of lunch and dinner 7/26 and 7/27 on this diet; no other PO intakes documented. Pt has been NPO since midnight for surgery today. Notes indicate pt underwent CT with contrast with findings of 5 new abscesses. Notes indicate ongoing abdominal pain/pressure. Pt with several BMs after use of Dulcolax suppository. Per Surgery note yesterday at 1010, exploratory laparotomy with drainage on intra-abdominal abscess, bowel resection, possible colostomy planned for today.  RD will continue to monitor for nutrition needs following surgery and with diet advancement. Unable to meet needs at this time.   Medications reviewed; sliding scale Novolog, PRN Zofran, IV Zosyn.  Labs reviewed; CBGs: 149-198 mg/dL since yesterday  AM, Ca: 8.3 mg/dL.  IVF: NS @ 75 mL/hr; LR @ 100 mL/hr.     7/26 - Diet advanced from NPO to CLD prior to lunch; at time of RD visit pt had consumed 100% of [place] ice and chicken broth and was sipping on soda.  - He is still experiencing abdominal pain but denies lunch intake exacerbating pain.  - PTA abdominal pain had been occurring for 10-12 days and was severe, stabbing in nature initially and then was combined with abdominal cramping. - During 10-12 period he has also experienced constipation.  - He reports only 2 instances of nausea during this time frame, no emesis with nausea.  - Pt reports that he was "drinking gallons of water each day" PTA and solid foods were limited to chicken noodle soup and [place] sausage on a hotdog but with condiments. - Per notes, CT abd showed 5 cm upper pelvic abscess; unable to be drained due to location. - Plan to repeat CT in 2-3 days and provide antibiotics.  - Will order Boost Breeze on CLD to supplement protein. - Physical assessment shows no muscle or fat wasting at this time.  - Pt unsure of weight trend during 10-12 days PTA.  - Per chart review, pt has lost 9 lbs (3.6% body weight) in the past 2 months which is not significant for time frame.  . IVF: NS @ 75 mL/hr.   Diet Order:  Diet NPO time specified  Skin:  Reviewed, no issues  Last BM:  7/31  Height:   Ht Readings from Last 1 Encounters:  05/16/16 5\' 8"  (1.727 m)    Weight:  Wt Readings from Last 1 Encounters:  05/16/16 238 lb 8.6 oz (108.2 kg)    Ideal Body Weight:  70 kg (kg)  BMI:  Body mass index is 36.27 kg/m.  Estimated Nutritional Needs:   Kcal:  1900-2100  Protein:  110-120 grams  Fluid:  >/= 2 L/day  EDUCATION NEEDS:   No education needs identified at this time    Jarome Matin, MS, RD, LDN Inpatient Clinical Dietitian Pager # [phone] After hours/weekend pager # [phone]

## 2016-05-23 NOTE — Progress Notes (Signed)
Spoke to patient and his family about tooth loss before intubation at the beginning of the procedure. I demonstrated that I performed a gentle scissoring manouver to open is mouth manually and his R upper incisor bent back at 90 degrees. At this point it was unsafe to leave it in situ and I removed it with no resistance and saved it for the patient. He acknowledged the presence of loose teeth.  He said he was doing a lot better and his pain was under control. I answered all their questions.  Lillia Abed MD

## 2016-05-24 DIAGNOSIS — K5720 Diverticulitis of large intestine with perforation and abscess without bleeding: Secondary | ICD-10-CM | POA: Diagnosis not present

## 2016-05-24 DIAGNOSIS — A419 Sepsis, unspecified organism: Secondary | ICD-10-CM | POA: Diagnosis not present

## 2016-05-24 DIAGNOSIS — E118 Type 2 diabetes mellitus with unspecified complications: Secondary | ICD-10-CM | POA: Diagnosis not present

## 2016-05-24 DIAGNOSIS — G4733 Obstructive sleep apnea (adult) (pediatric): Secondary | ICD-10-CM | POA: Diagnosis not present

## 2016-05-24 DIAGNOSIS — I10 Essential (primary) hypertension: Secondary | ICD-10-CM | POA: Diagnosis not present

## 2016-05-24 DIAGNOSIS — K913 Postprocedural intestinal obstruction: Secondary | ICD-10-CM | POA: Diagnosis not present

## 2016-05-24 LAB — CBC
HCT: 38.3 % — ABNORMAL LOW (ref 39.0–52.0)
HEMOGLOBIN: 12.5 g/dL — AB (ref 13.0–17.0)
MCH: 31.1 pg (ref 26.0–34.0)
MCHC: 32.6 g/dL (ref 30.0–36.0)
MCV: 95.3 fL (ref 78.0–100.0)
Platelets: 409 10*3/uL — ABNORMAL HIGH (ref 150–400)
RBC: 4.02 MIL/uL — ABNORMAL LOW (ref 4.22–5.81)
RDW: 14.1 % (ref 11.5–15.5)
WBC: 14.4 10*3/uL — ABNORMAL HIGH (ref 4.0–10.5)

## 2016-05-24 LAB — BASIC METABOLIC PANEL
ANION GAP: 7 (ref 5–15)
BUN: 13 mg/dL (ref 6–20)
CALCIUM: 8.2 mg/dL — AB (ref 8.9–10.3)
CO2: 22 mmol/L (ref 22–32)
CREATININE: 0.84 mg/dL (ref 0.61–1.24)
Chloride: 107 mmol/L (ref 101–111)
GFR calc Af Amer: >60 mL/min (ref >60)
GLUCOSE: 236 mg/dL — AB (ref 65–99)
Potassium: 5 mmol/L (ref 3.5–5.1)
Sodium: 136 mmol/L (ref 135–145)

## 2016-05-24 LAB — GLUCOSE, CAPILLARY
GLUCOSE-CAPILLARY: 207 mg/dL — AB (ref 65–99)
GLUCOSE-CAPILLARY: 236 mg/dL — AB (ref 65–99)
GLUCOSE-CAPILLARY: 237 mg/dL — AB (ref 65–99)
GLUCOSE-CAPILLARY: 226 mg/dL — AB (ref 65–99)
GLUCOSE-CAPILLARY: 195 mg/dL — AB (ref 65–99)
GLUCOSE-CAPILLARY: 192 mg/dL — AB (ref 65–99)
Glucose-Capillary: 254 mg/dL — ABNORMAL HIGH (ref 65–99)

## 2016-05-24 MED ORDER — SODIUM CHLORIDE 0.9% FLUSH
10.0000 mL | INTRAVENOUS | Status: DC | PRN
  Administered 2016-05-26: 10 mL
  Filled 2016-05-24: qty 40

## 2016-05-24 MED ORDER — DIPHENHYDRAMINE HCL 12.5 MG/5ML PO ELIX: 12.5000 mg | ORAL_SOLUTION | Freq: Four times a day (QID) | ORAL | Status: DC | PRN

## 2016-05-24 MED ORDER — ONDANSETRON HCL 4 MG/2ML IJ SOLN: 4.0000 mg | Freq: Four times a day (QID) | INTRAMUSCULAR | Status: DC | PRN

## 2016-05-24 MED ORDER — PHENOL 1.4 % MT LIQD
1.0000 | OROMUCOSAL | Status: DC | PRN
  Filled 2016-05-24: qty 177

## 2016-05-24 MED ORDER — INSULIN DETEMIR 100 UNIT/ML ~~LOC~~ SOLN
5.0000 [IU] | Freq: Every day | SUBCUTANEOUS | Status: DC
  Administered 2016-05-24: 5 [IU] via SUBCUTANEOUS
  Filled 2016-05-24: qty 0.05

## 2016-05-24 MED ORDER — ATENOLOL 50 MG PO TABS
50.0000 mg | ORAL_TABLET | Freq: Every day | ORAL | Status: DC
Start: 2016-05-24 — End: 2016-06-02
  Administered 2016-05-26 – 2016-06-01 (×6): 50 mg via ORAL
  Filled 2016-05-24 (×6): qty 1
  Filled 2016-05-24: qty 2
  Filled 2016-05-24: qty 1

## 2016-05-24 MED ORDER — NALOXONE HCL 0.4 MG/ML IJ SOLN: 0.4000 mg | INTRAMUSCULAR | Status: DC | PRN

## 2016-05-24 MED ORDER — DEXTROSE-NACL 5-0.9 % IV SOLN
INTRAVENOUS | Status: DC
  Administered 2016-05-24: 18:00:00 via INTRAVENOUS

## 2016-05-24 MED ORDER — DIPHENHYDRAMINE HCL 50 MG/ML IJ SOLN: 12.5000 mg | Freq: Four times a day (QID) | INTRAMUSCULAR | Status: DC | PRN

## 2016-05-24 MED ORDER — FAT EMULSION 20 % IV EMUL
240.0000 mL | INTRAVENOUS | Status: AC
  Administered 2016-05-24: 240 mL via INTRAVENOUS
  Filled 2016-05-24: qty 250

## 2016-05-24 MED ORDER — TRACE MINERALS CR-CU-MN-SE-ZN 10-1000-500-60 MCG/ML IV SOLN
INTRAVENOUS | Status: AC
  Administered 2016-05-24: 18:00:00 via INTRAVENOUS
  Filled 2016-05-24: qty 960

## 2016-05-24 MED ORDER — DEXTROSE-NACL 5-0.9 % IV SOLN: INTRAVENOUS | Status: AC

## 2016-05-24 MED ORDER — SODIUM CHLORIDE 0.9% FLUSH: 9.0000 mL | INTRAVENOUS | Status: DC | PRN

## 2016-05-24 MED ORDER — CHLORHEXIDINE GLUCONATE CLOTH 2 % EX PADS
6.0000 | MEDICATED_PAD | Freq: Every day | CUTANEOUS | Status: AC
  Administered 2016-05-25 – 2016-05-27 (×3): 6 via TOPICAL

## 2016-05-24 MED ORDER — HYDROMORPHONE 1 MG/ML IV SOLN
INTRAVENOUS | Status: DC
  Administered 2016-05-24: 10:00:00 via INTRAVENOUS
  Administered 2016-05-25: 1.8 mg via INTRAVENOUS
  Administered 2016-05-25 (×2): 3.6 mg via INTRAVENOUS
  Administered 2016-05-25: 3 mg via INTRAVENOUS
  Administered 2016-05-26: 0.6 mg via INTRAVENOUS
  Administered 2016-05-26: 1.6 mg via INTRAVENOUS
  Administered 2016-05-26: 1.4 mg via INTRAVENOUS
  Filled 2016-05-24: qty 25

## 2016-05-24 MED ORDER — INSULIN ASPART 100 UNIT/ML ~~LOC~~ SOLN
0.0000 [IU] | SUBCUTANEOUS | Status: DC
  Administered 2016-05-24: 5 [IU] via SUBCUTANEOUS

## 2016-05-24 MED ORDER — SODIUM CHLORIDE 0.9% FLUSH
10.0000 mL | Freq: Two times a day (BID) | INTRAVENOUS | Status: DC
  Administered 2016-05-25 (×2): 10 mL

## 2016-05-24 MED ORDER — MUPIROCIN 2 % EX OINT
1.0000 "application " | TOPICAL_OINTMENT | Freq: Two times a day (BID) | CUTANEOUS | Status: AC
  Administered 2016-05-24 – 2016-05-29 (×10): 1 via NASAL
  Filled 2016-05-24 (×2): qty 22

## 2016-05-24 NOTE — Consult Note (Signed)
[facility] Nurse ostomy consult note:   Patient visit will be deferred until tomorrow as PICC is being inserted at time of visit.  Stoma is noted to be edematous, viable with no output.  [place] [place] nursing team will follow, and will remain available to this patient, the nursing, surgical and medical teams.   Thanks, Maudie Flakes, MSN, RN, [place], Arther Abbott  Pager# [phone]

## 2016-05-24 NOTE — Progress Notes (Signed)
1 Day Post-Op  Subjective: Less pain now then before surgery.  Thirsty.  Objective: Vital signs in last 24 hours: Temp:  [97.4 F (36.3 C)-98.3 F (36.8 C)] 98.1 F (36.7 C) (08/02 0354) Pulse Rate:  [72-86] 73 (08/02 0700) Resp:  [10-19] 14 (08/02 0700) BP: (116-178)/(53-84) 137/70 (08/02 0700) SpO2:  [87 %-100 %] 93 % (08/02 0700) Last BM Date: 05/23/16  Intake/Output from previous day: 08/01 0701 - 08/02 0700 In: 5516.7 [I.V.:5166.7; IV Piggyback:350] Out: [id number] [Urine:1075; Drains:85; Blood:650] Intake/Output this shift: No intake/output data recorded.  PE: General- In NAD Abdomen-slight firm and distended, open wound clean, colostomy edematous and viable with no output  Lab Results:   Recent Labs  05/22/16 1041 05/24/16 0343  WBC 10.7* 14.4*  HGB 10.6* 12.5*  HCT 31.6* 38.3*  PLT 363 409*   BMET  Recent Labs  05/22/16 1041 05/24/16 0343  NA 138 136  K 3.6 5.0  CL 105 107  CO2 23 22  GLUCOSE 151* 236*  BUN 13 13  CREATININE 0.84 0.84  CALCIUM 8.3* 8.2*   PT/INR No results for input(s): LABPROT, INR in the last 72 hours. Comprehensive Metabolic Panel:    Component Value Date/Time   NA 136 05/24/2016 0343   NA 138 05/22/2016 1041   K 5.0 05/24/2016 0343   K 3.6 05/22/2016 1041   CL 107 05/24/2016 0343   CL 105 05/22/2016 1041   CO2 22 05/24/2016 0343   CO2 23 05/22/2016 1041   BUN 13 05/24/2016 0343   BUN 13 05/22/2016 1041   CREATININE 0.84 05/24/2016 0343   CREATININE 0.84 05/22/2016 1041   CREATININE 0.96 03/13/2016 0803   GLUCOSE 236 (H) 05/24/2016 0343   GLUCOSE 151 (H) 05/22/2016 1041   CALCIUM 8.2 (L) 05/24/2016 0343   CALCIUM 8.3 (L) 05/22/2016 1041   AST 28 05/22/2016 1041   AST 20 05/16/2016 1031   ALT 22 05/22/2016 1041   ALT 27 05/16/2016 1031   ALKPHOS 72 05/22/2016 1041   ALKPHOS 58 05/16/2016 1031   BILITOT 0.9 05/22/2016 1041   BILITOT 1.3 (H) 05/16/2016 1031   PROT 5.7 (L) 05/22/2016 1041   PROT 8.0 05/16/2016 1031    ALBUMIN 2.3 (L) 05/22/2016 1041   ALBUMIN 4.0 05/16/2016 1031     Studies/Results: No results found.  Anti-infectives: Anti-infectives    Start     Dose/Rate Route Frequency Ordered Stop   05/23/16 1530  piperacillin-tazobactam (ZOSYN) IVPB 3.375 g     3.375 g 12.5 mL/hr over 240 Minutes Intravenous Every 8 hours 05/23/16 1435     05/16/16 2200  piperacillin-tazobactam (ZOSYN) IVPB 3.375 g  Status:  Discontinued     3.375 g 12.5 mL/hr over 240 Minutes Intravenous Every 8 hours 05/16/16 1710 05/23/16 2152   05/16/16 1500  piperacillin-tazobactam (ZOSYN) IVPB 3.375 g     3.375 g 100 mL/hr over 30 Minutes Intravenous  Once 05/16/16 1435 05/16/16 1613   05/16/16 1430  piperacillin-tazobactam (ZOSYN) IVPB 4.5 g  Status:  Discontinued     4.5 g 200 mL/hr over 30 Minutes Intravenous  Once 05/16/16 1357 05/16/16 1435      Assessment Principal Problem:  Complicated diverticulitis with contained perforation and multiple abscesses s/p expl lap, drainage of abscesses, 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  PICC and start TPN.  Shawntelle Ungar J 05/24/2016

## 2016-05-24 NOTE — Progress Notes (Signed)
PARENTERAL NUTRITION CONSULT NOTE - INITIAL  Pharmacy Consult for TPN Indication: intolerance to enteral feedings  Allergies  Allergen Reactions  . Niacin And Related Other (See Comments)    Extreme fatigue    Patient Measurements: Height: 5\' 8"  (172.7 cm) Weight: 238 lb 8.6 oz (108.2 kg) IBW/kg (Calculated) : 68.4 Adjusted Body Weight: 100 kg Usual Weight: 113 kg on admit 05/16/16  Vital Signs: Temp: 97.6 F (36.4 C) (08/02 0800) Temp Source: Oral (08/02 0800) BP: 137/70 (08/02 0700) Pulse Rate: 73 (08/02 0700) Intake/Output from previous day: 08/01 0701 - 08/02 0700 In: 5516.7 [I.V.:5166.7; IV Piggyback:350] Out: [id number] [Urine:1075; Drains:85; Blood:650] Intake/Output from this shift: No intake/output data recorded.  Labs:  Recent Labs  05/22/16 1041 05/24/16 0343  WBC 10.7* 14.4*  HGB 10.6* 12.5*  HCT 31.6* 38.3*  PLT 363 409*     Recent Labs  05/22/16 1041 05/24/16 0343  NA 138 136  K 3.6 5.0  CL 105 107  CO2 23 22  GLUCOSE 151* 236*  BUN 13 13  CREATININE 0.84 0.84  CALCIUM 8.3* 8.2*  PROT 5.7*  --   ALBUMIN 2.3*  --   AST 28  --   ALT 22  --   ALKPHOS 72  --   BILITOT 0.9  --   PREALBUMIN 4.6*  --    Estimated Creatinine Clearance: 97.6 mL/min (by C-G formula based on SCr of 0.84 mg/dL).    Recent Labs  05/23/16 1906 05/23/16 2310 05/24/16 0352  GLUCAP 189* 207* 236*    Medical History: Past Medical History:  Diagnosis Date  . CAD (coronary artery disease)    4 stents in right artery  . Complex tear of medial meniscus of left knee as current injury 07/26/2012   [place]  . Diabetes mellitus   . Glaucoma 2006   on drops, followed by [place]  . Hyperlipidemia   . Hypertension   . Myocardial infarction ([place])    1991  . Sleep apnea    uses CPAP regularly    Medications:  Scheduled:  . atenolol  50 mg Oral Daily  . famotidine (PEPCID) IV  20 mg Intravenous Q12H  . heparin subcutaneous  5,000 Units Subcutaneous Q8H  .  HYDROmorphone   Intravenous Q4H  . insulin aspart  0-15 Units Subcutaneous Q4H  . insulin aspart  0-9 Units Subcutaneous Q4H  . insulin detemir  5 Units Subcutaneous QHS  . metoprolol  5 mg Intravenous Q6H  . piperacillin-tazobactam (ZOSYN)  IV  3.375 g Intravenous Q8H   Infusions:  . dextrose 5 % and 0.9% NaCl 100 mL/hr at 05/24/16 0715   PRN: diphenhydrAMINE **OR** diphenhydrAMINE, naloxone **AND** sodium chloride flush, ondansetron (ZOFRAN) IV, phenol  Insulin Requirements: 28 units Novolog SSI resistant scale  Current Nutrition: NPO  IVF: D5NS at 100 ml/hr  Central access: PICC planned 8/2 TPN start date:  8/2  ASSESSMENT  HPI: 70 y/o with h/o CAD and 4 stents, OSA on CPAP, DM, HTN, HLD who presents for abdominal pain & constipation. Found to have an intra-abdominal abscess on CT scan. IR unable to place drain as abscess is quite deep.  Initally, plan was to continue IV antibiotics in hopes to avoid surgery. Repeat CT on 7/31 showed many new small abscessed, prior abscess improving.  Significant events:  8/1: Expl lap, drainage of abscesses, sigmoid colectomy and colostomy  8/2: Abdomen-slight firm and distended, colostomy edematous and viable with no output. Place PICC and begin TPN per Pharmacy.  Today:    Glucose: >goal of less than 150 - MD starting Levemir (PTA amaryl & metformin on hold while NPO)  Electrolytes: K+ at upper end of goal; corrected Ca WNL  Renal: SCr WNL  LFTs: WNL  TGs: check in AM  Prealbumin: 4.6 (7/31)  NUTRITIONAL GOALS                                                                                             RD recs: 1900-2100 kcal, 110-120g protein per day Clinimix 5/15 at a goal rate of 28ml/hr + 20% fat emulsion at 82ml/hr to provide: 108g/day protein, 2013Kcal/day.  PLAN                                                                                                                          At 1800 today:  Start Clinimix 5/15 (no electrolytes) at 16ml/hr.  20% fat emulsion at 55ml/hr.  Plan to advance as tolerated to the goal rate.  TPN to contain standard multivitamins and trace elements.  Reduce IVF to 50 ml/hr.  Add 8 units insulin to TPN per day  Continue SSI moderate scale q4h and Levemir 5 units qhs .   TPN lab panels on Mondays & Thursdays.  F/u daily.  Peggyann Juba, PharmD, BCPS Pager: [phone] 05/24/2016,9:05 AM

## 2016-05-24 NOTE — Progress Notes (Addendum)
[facility] PROGRESS NOTE    Progress Note  Evan Avery  [id number] DOB: June 21, 1946 DOA: 05/16/2016 PCP: [name], MD     Brief Narrative:   Evan Avery is an 70 y.o. male past medical history of coronary artery disease with a stent placement 92, obesity came into the hospital complaining of abdominal pain scan of the abdomen and pelvis showed 5 cm developing abscess in the pelvis likely secondary to diverticular sigmoid colon Perforation, surgery was consulted, who recommended IR to place cutaneous drain, aspirin and Plavix were held. IR was unable to play drain his abscess is quite deep, surgery recommended to continue to IV antibiotics with hopes to avoid surgery.  Assessment/Plan:   Sepsis ([place]) due to Colonic diverticular abscess due to Diverticulitis of colon with perforation: Started on empiric antibiotics Zosyn with no improvement, IR was consulted who was not able to access the abscess. Continue hold aspirin and Plavix. His abdominal pain worsens neurosurgery decided to proceed with exploratory laparotomy and drainage of multiple intra-abdominal abscess with sigmoid colectomy and a colostomy Has remained afebrile complaining of throat pain will start Chloraseptic Spray. Patient is not controlled will increase PCA pump. Insert PICC line.  Diabetes mellitus type 2, uncontrolled, with complications (HCC) Blood glucose trending high, we'll start him on low-dose Levemir plus sliding scale insulin, with CBGs every 4 hours.  CAD with stents: 4 stents in the RCA pain 2009, holding aspirin and Plavix Surgery to advise when to resume. Resume atenolol.  Cough: Hypoxic: Continue Tessalon Perles.  Essential hypertension: Resume atenolol continue to hold ACE inhibitor.  OSA on CPAP: Continue Cipro tonight.  Obesity, Class II, BMI 35-39.9, with comorbidity (HCC)   DVT prophylaxis: lovenox Family Communication:none Disposition Plan/Barrier to D/C: to  be detemine Code Status:     Code Status Orders        Start     Ordered   05/23/16 1436  Full code  Continuous     05/23/16 1435    Code Status History    Date Active Date Inactive Code Status Order ID Comments User Context   05/16/2016  4:58 PM 05/23/2016  2:35 PM Full Code [id number]  [name], MD Inpatient   10/11/2011  8:57 AM 10/13/2011  3:49 PM Full Code [id number]  [name], RN Inpatient    Advance Directive Documentation   Flowsheet Row Most Recent Value  Type of Advance Directive  Healthcare Power of Attorney, Living will  Pre-existing out of facility DNR order (yellow form or pink MOST form)  No data  "MOST" Form in Place?  No data        IV Access:    Peripheral IV   Procedures and diagnostic studies:   No results found.   Medical Consultants:    None.  Anti-Infectives:   Zosyn  Subjective:    Evan Avery releases pain is not controlled.  Objective:    Vitals:   05/24/16 0600 05/24/16 0622 05/24/16 0700 05/24/16 0800  BP: (!) 151/67  137/70   Pulse: 77 80 73   Resp: 11 13 14    Temp:    97.6 F (36.4 C)  TempSrc:    Oral  SpO2: 92% 91% 93%   Weight:      Height:        Intake/Output Summary (Last 24 hours) at 05/24/16 0826 Last data filed at 05/24/16 0700  Gross per 24 hour  Intake          5516.67  ml  Output             1610 ml  Net          3906.67 ml   Filed Weights   05/16/16 1626 05/16/16 1706  Weight: 113.4 kg (250 lb) 108.2 kg (238 lb 8.6 oz)    Exam: General exam: In no acute distress. Respiratory system: Good air movement and clear to auscultation. Cardiovascular system: S1 & S2 heard, RRR. Gastrointestinal system: Abdomen is Distended, colostomy in place tender to palpation. Central nervous system: Alert and oriented. No focal neurological deficits. Extremities: No pedal edema. Skin: No rashes, lesions or ulcers Psychiatry: Judgement and insight appear normal. Mood & affect appropriate.    Data  Reviewed:    Labs: Basic Metabolic Panel:  Recent Labs Lab 05/18/16 0500 05/20/16 0542 05/22/16 1041 05/24/16 0343  NA 134* 135 138 136  K 3.8 3.5 3.6 5.0  CL 102 103 105 107  CO2 22 24 23 22   GLUCOSE 154* 174* 151* 236*  BUN 18 11 13 13   CREATININE 1.09 0.87 0.84 0.84  CALCIUM 8.4* 8.2* 8.3* 8.2*   GFR Estimated Creatinine Clearance: 97.6 mL/min (by C-G formula based on SCr of 0.84 mg/dL). Liver Function Tests:  Recent Labs Lab 05/22/16 1041  AST 28  ALT 22  ALKPHOS 72  BILITOT 0.9  PROT 5.7*  ALBUMIN 2.3*   No results for input(s): LIPASE, AMYLASE in the last 168 hours. No results for input(s): AMMONIA in the last 168 hours. Coagulation profile No results for input(s): INR, PROTIME in the last 168 hours.  CBC:  Recent Labs Lab 05/20/16 0542 05/22/16 1041 05/24/16 0343  WBC 10.1 10.7* 14.4*  HGB 10.9* 10.6* 12.5*  HCT 32.4* 31.6* 38.3*  MCV 93.6 94.3 95.3  PLT 275 363 409*   Cardiac Enzymes: No results for input(s): CKTOTAL, CKMB, CKMBINDEX, TROPONINI in the last 168 hours. BNP (last 3 results) No results for input(s): PROBNP in the last 8760 hours. CBG:  Recent Labs Lab 05/23/16 1255 05/23/16 1604 05/23/16 1906 05/23/16 2310 05/24/16 0352  GLUCAP 201* 205* 189* 207* 236*   D-Dimer: No results for input(s): DDIMER in the last 72 hours. Hgb A1c: No results for input(s): HGBA1C in the last 72 hours. Lipid Profile: No results for input(s): CHOL, HDL, LDLCALC, TRIG, CHOLHDL, LDLDIRECT in the last 72 hours. Thyroid function studies: No results for input(s): TSH, T4TOTAL, T3FREE, THYROIDAB in the last 72 hours.  Invalid input(s): FREET3 Anemia work up: No results for input(s): VITAMINB12, FOLATE, FERRITIN, TIBC, IRON, RETICCTPCT in the last 72 hours. Sepsis Labs:  Recent Labs Lab 05/20/16 0542 05/22/16 1041 05/24/16 0343  WBC 10.1 10.7* 14.4*   Microbiology Recent Results (from the past 240 hour(s))  Urine culture     Status:  Abnormal   Collection Time: 05/16/16 12:23 PM  Result Value Ref Range Status   Specimen Description URINE, CLEAN CATCH  Final   Special Requests NONE  Final   Culture MULTIPLE SPECIES PRESENT, SUGGEST RECOLLECTION (A)  Final   Report Status 05/17/2016 FINAL  Final  Culture, blood (Routine X 2) w Reflex to ID Panel     Status: None   Collection Time: 05/16/16  5:25 PM  Result Value Ref Range Status   Specimen Description BLOOD RIGHT ARM  Final   Special Requests BOTTLES DRAWN AEROBIC AND ANAEROBIC  10CC  Final   Culture   Final    NO GROWTH 5 DAYS Performed at [facility]  Report Status 05/21/2016 FINAL  Final  Culture, blood (Routine X 2) w Reflex to ID Panel     Status: None   Collection Time: 05/16/16  5:30 PM  Result Value Ref Range Status   Specimen Description BLOOD LEFT HAND  Final   Special Requests BOTTLES DRAWN AEROBIC AND ANAEROBIC  10CC  Final   Culture   Final    NO GROWTH 5 DAYS Performed at [facility]    Report Status 05/21/2016 FINAL  Final  Surgical pcr screen     Status: Abnormal   Collection Time: 05/23/16  7:59 AM  Result Value Ref Range Status   MRSA, PCR POSITIVE (A) NEGATIVE Final    Comment: RESULT CALLED TO, READ BACK BY AND VERIFIED WITH: Rison AT 1055 ON 08.01.17 BY SHUEA    Staphylococcus aureus POSITIVE (A) NEGATIVE Final    Comment:        The Xpert SA Assay (FDA approved for NASAL specimens in patients over 68 years of age), is one component of a comprehensive surveillance program.  Test performance has been validated by [facility] for patients greater than or equal to 67 year old. It is not intended to diagnose infection nor to guide or monitor treatment.      Medications:   . famotidine (PEPCID) IV  20 mg Intravenous Q12H  . heparin subcutaneous  5,000 Units Subcutaneous Q8H  . insulin aspart  0-15 Units Subcutaneous Q4H  . metoprolol  5 mg Intravenous Q6H  . morphine   Intravenous Q4H  .  piperacillin-tazobactam (ZOSYN)  IV  3.375 g Intravenous Q8H   Continuous Infusions: . dextrose 5 % and 0.9% NaCl      Time spent: 25 min   LOS: 8 days   Charlynne Cousins  [facility] Pager [phone]  *Please refer to [email or web address], password [email or web address] to get updated schedule on who will round on this patient, as hospitalists switch teams weekly. If 7PM-7AM, please contact night-coverage at [email or web address], password [email or web address] for any overnight needs.  05/24/2016, 8:26 AM

## 2016-05-24 NOTE — Progress Notes (Signed)
Peripherally Inserted Central Catheter/Midline Placement  The IV Nurse has discussed with the patient and/or persons authorized to consent for the patient, the purpose of this procedure and the potential benefits and risks involved with this procedure.  The benefits include less needle sticks, lab draws from the catheter and patient may be discharged home with the catheter.  Risks include, but not limited to, infection, bleeding, blood clot (thrombus formation), and puncture of an artery; nerve damage and irregular heat beat.  Alternatives to this procedure were also discussed.  PICC/Midline Placement Documentation        Evan Avery 05/24/2016, 1:27 PM

## 2016-05-24 NOTE — Progress Notes (Signed)
Pt refused CPAP qhs.  Pt has NG tube in and is wearing end-tidal CO2 monitoring.  Pt stated that he is unable to tolerate the CPAP mask tonight.  RT will continue to monitor as needed.

## 2016-05-25 ENCOUNTER — Encounter: Payer: Self-pay | Admitting: General Surgery

## 2016-05-25 DIAGNOSIS — K5720 Diverticulitis of large intestine with perforation and abscess without bleeding: Secondary | ICD-10-CM | POA: Diagnosis not present

## 2016-05-25 DIAGNOSIS — K913 Postprocedural intestinal obstruction: Secondary | ICD-10-CM | POA: Diagnosis not present

## 2016-05-25 DIAGNOSIS — E118 Type 2 diabetes mellitus with unspecified complications: Secondary | ICD-10-CM | POA: Diagnosis not present

## 2016-05-25 DIAGNOSIS — I10 Essential (primary) hypertension: Secondary | ICD-10-CM | POA: Diagnosis not present

## 2016-05-25 DIAGNOSIS — A419 Sepsis, unspecified organism: Secondary | ICD-10-CM | POA: Diagnosis not present

## 2016-05-25 DIAGNOSIS — G4733 Obstructive sleep apnea (adult) (pediatric): Secondary | ICD-10-CM | POA: Diagnosis not present

## 2016-05-25 LAB — COMPREHENSIVE METABOLIC PANEL
ALBUMIN: 1.9 g/dL — AB (ref 3.5–5.0)
ALT: 19 U/L (ref 17–63)
AST: 23 U/L (ref 15–41)
Alkaline Phosphatase: 50 U/L (ref 38–126)
Anion gap: 6 (ref 5–15)
BILIRUBIN TOTAL: 0.6 mg/dL (ref 0.3–1.2)
BUN: 13 mg/dL (ref 6–20)
CHLORIDE: 104 mmol/L (ref 101–111)
CO2: 27 mmol/L (ref 22–32)
CREATININE: 0.81 mg/dL (ref 0.61–1.24)
Calcium: 8.1 mg/dL — ABNORMAL LOW (ref 8.9–10.3)
GFR calc Af Amer: >60 mL/min (ref >60)
GLUCOSE: 229 mg/dL — AB (ref 65–99)
Potassium: 3.7 mmol/L (ref 3.5–5.1)
Sodium: 137 mmol/L (ref 135–145)
Total Protein: 4.8 g/dL — ABNORMAL LOW (ref 6.5–8.1)

## 2016-05-25 LAB — CBC
HEMATOCRIT: 29 % — AB (ref 39.0–52.0)
HEMOGLOBIN: 9.6 g/dL — AB (ref 13.0–17.0)
MCH: 31.7 pg (ref 26.0–34.0)
MCHC: 33.1 g/dL (ref 30.0–36.0)
MCV: 95.7 fL (ref 78.0–100.0)
Platelets: 374 10*3/uL (ref 150–400)
RBC: 3.03 MIL/uL — ABNORMAL LOW (ref 4.22–5.81)
RDW: 14.1 % (ref 11.5–15.5)
WBC: 16.9 10*3/uL — AB (ref 4.0–10.5)

## 2016-05-25 LAB — DIFFERENTIAL
BASOS ABS: 0 10*3/uL (ref 0.0–0.1)
BASOS PCT: 0 %
Eosinophils Absolute: 0 10*3/uL (ref 0.0–0.7)
Eosinophils Relative: 0 %
LYMPHS ABS: 1 10*3/uL (ref 0.7–4.0)
LYMPHS PCT: 6 %
MONO ABS: 1.1 10*3/uL — AB (ref 0.1–1.0)
MONOS PCT: 6 %
NEUTROS ABS: 14.9 10*3/uL — AB (ref 1.7–7.7)
Neutrophils Relative %: 88 %

## 2016-05-25 LAB — PHOSPHORUS: Phosphorus: 2.2 mg/dL — ABNORMAL LOW (ref 2.5–4.6)

## 2016-05-25 LAB — GLUCOSE, CAPILLARY
GLUCOSE-CAPILLARY: 235 mg/dL — AB (ref 65–99)
GLUCOSE-CAPILLARY: 213 mg/dL — AB (ref 65–99)
GLUCOSE-CAPILLARY: 240 mg/dL — AB (ref 65–99)
GLUCOSE-CAPILLARY: 167 mg/dL — AB (ref 65–99)
Glucose-Capillary: 148 mg/dL — ABNORMAL HIGH (ref 65–99)
Glucose-Capillary: 145 mg/dL — ABNORMAL HIGH (ref 65–99)

## 2016-05-25 LAB — PREALBUMIN: PREALBUMIN: 5.1 mg/dL — AB (ref 18–38)

## 2016-05-25 LAB — TRIGLYCERIDES: TRIGLYCERIDES: 226 mg/dL — AB (ref <150)

## 2016-05-25 LAB — MAGNESIUM: Magnesium: 1.8 mg/dL (ref 1.7–2.4)

## 2016-05-25 MED ORDER — ZOLPIDEM TARTRATE 5 MG PO TABS
5.0000 mg | ORAL_TABLET | Freq: Every evening | ORAL | Status: DC | PRN
  Administered 2016-05-25: 5 mg via ORAL
  Filled 2016-05-25: qty 1

## 2016-05-25 MED ORDER — INSULIN ASPART 100 UNIT/ML ~~LOC~~ SOLN
0.0000 [IU] | SUBCUTANEOUS | Status: DC
  Administered 2016-05-25 (×2): 7 [IU] via SUBCUTANEOUS
  Administered 2016-05-25: 3 [IU] via SUBCUTANEOUS
  Administered 2016-05-25 – 2016-05-26 (×3): 4 [IU] via SUBCUTANEOUS
  Administered 2016-05-26 (×3): 3 [IU] via SUBCUTANEOUS
  Administered 2016-05-26: 4 [IU] via SUBCUTANEOUS
  Administered 2016-05-27 (×3): 3 [IU] via SUBCUTANEOUS
  Administered 2016-05-27: 4 [IU] via SUBCUTANEOUS
  Administered 2016-05-27: 3 [IU] via SUBCUTANEOUS
  Administered 2016-05-28 (×5): 4 [IU] via SUBCUTANEOUS
  Administered 2016-05-28: 3 [IU] via SUBCUTANEOUS
  Administered 2016-05-28: 4 [IU] via SUBCUTANEOUS
  Administered 2016-05-29 (×3): 7 [IU] via SUBCUTANEOUS
  Administered 2016-05-29: 3 [IU] via SUBCUTANEOUS
  Administered 2016-05-29 – 2016-05-30 (×3): 4 [IU] via SUBCUTANEOUS
  Administered 2016-05-30: 3 [IU] via SUBCUTANEOUS
  Administered 2016-05-30: 4 [IU] via SUBCUTANEOUS
  Administered 2016-05-30: 7 [IU] via SUBCUTANEOUS
  Administered 2016-05-30: 3 [IU] via SUBCUTANEOUS
  Administered 2016-05-30 – 2016-05-31 (×3): 4 [IU] via SUBCUTANEOUS
  Administered 2016-05-31: 7 [IU] via SUBCUTANEOUS
  Administered 2016-05-31: 3 [IU] via SUBCUTANEOUS
  Administered 2016-06-01: 4 [IU] via SUBCUTANEOUS
  Administered 2016-06-01: 3 [IU] via SUBCUTANEOUS
  Administered 2016-06-01: 4 [IU] via SUBCUTANEOUS

## 2016-05-25 MED ORDER — FAT EMULSION 20 % IV EMUL
240.0000 mL | INTRAVENOUS | Status: AC
  Administered 2016-05-25: 240 mL via INTRAVENOUS
  Filled 2016-05-25 (×2): qty 250

## 2016-05-25 MED ORDER — CETYLPYRIDINIUM CHLORIDE 0.05 % MT LIQD
7.0000 mL | Freq: Two times a day (BID) | OROMUCOSAL | Status: DC
  Administered 2016-05-25 – 2016-05-31 (×8): 7 mL via OROMUCOSAL

## 2016-05-25 MED ORDER — INSULIN DETEMIR 100 UNIT/ML ~~LOC~~ SOLN
10.0000 [IU] | Freq: Two times a day (BID) | SUBCUTANEOUS | Status: DC
  Administered 2016-05-25 – 2016-05-27 (×5): 10 [IU] via SUBCUTANEOUS
  Filled 2016-05-25 (×5): qty 0.1

## 2016-05-25 MED ORDER — SODIUM CHLORIDE 0.9 % IV SOLN: INTRAVENOUS | Status: AC

## 2016-05-25 MED ORDER — DEXTROSE 5 % IV SOLN
20.0000 mmol | Freq: Once | INTRAVENOUS | Status: AC
  Administered 2016-05-25: 20 mmol via INTRAVENOUS
  Filled 2016-05-25: qty 6.67

## 2016-05-25 MED ORDER — CHLORHEXIDINE GLUCONATE 0.12 % MT SOLN
15.0000 mL | Freq: Two times a day (BID) | OROMUCOSAL | Status: DC
  Administered 2016-05-26 – 2016-06-01 (×10): 15 mL via OROMUCOSAL
  Filled 2016-05-25 (×11): qty 15

## 2016-05-25 MED ORDER — M.V.I. ADULT IV INJ
INTRAVENOUS | Status: AC
  Administered 2016-05-25: 18:00:00 via INTRAVENOUS
  Filled 2016-05-25 (×2): qty 960

## 2016-05-25 NOTE — Progress Notes (Signed)
Patient admitted to room 1532.  Oriented to unit, room, staff, call bell and telephone.  Stated understanding.  Vitals obtained and patient assessed.  Patient states pain is well controlled on PCA.  Son at bedside.  Will continue to monitor.

## 2016-05-25 NOTE — Progress Notes (Signed)
Patient states he is unable to wear his CPAP due to NG tube placement. RT will continue to monitor.

## 2016-05-25 NOTE — Progress Notes (Signed)
[facility] PROGRESS NOTE    Progress Note  Evan Avery  [id number] DOB: [birth year] DOA: 05/16/2016 PCP: [name], MD     Brief Narrative:   Evan Avery is an 70 y.o. male past medical history of coronary artery disease with a stent placement 92, obesity came into the hospital complaining of abdominal pain scan of the abdomen and pelvis showed 5 cm developing abscess in the pelvis likely secondary to diverticular sigmoid colon Perforation, surgery was consulted, who recommended IR to place cutaneous drain, aspirin and Plavix were held. IR was unable to play drain his abscess is quite deep, As the patient decompensated on 05/23/2012 surgery proceeded with intervention and colostomy  Assessment/Plan:   Sepsis ([place]) due to Colonic diverticular abscess due to Diverticulitis of colon with perforation: Started on empiric antibiotics Zosyn with no improvement. Surgery to rec when to start aspirin and Plavix. Patient may have ice chips. Exploratory laparotomy and drainage of multiple intra-abdominal abscess with sigmoid colectomy and a colostomy on 8.1.2017. Continue PCA pump. PICC line inserted on 05/24/2012, worsening leukocytosis but has remained afebrile, thrombocytosis has resolved. Question of February due to stressed emargination. He'll probably need a CT scan in the future at some point will defer to surgery.  Diabetes mellitus type 2, uncontrolled, with complications ([place]) Blood glucose cot to be high, will increase his Levemir and cont sliding scale insulin, with CBGs every 4 hours.  CAD with stents: 4 stents in the RCA pain 2009, copnt to hold aspirin and Plavix Surgery to advise when to resume. Resume atenolol.  Cough: Hypoxic: Continue Tessalon Perles.  Essential hypertension: Resume atenolol continue to hold ACE inhibitor.  OSA on CPAP: Continue Cipro tonight.  Obesity, Class II, BMI 35-39.9, with comorbidity (HCC)   DVT prophylaxis:  lovenox Family Communication:none Disposition Plan/Barrier to D/C: to be detemine Code Status:     Code Status Orders        Start     Ordered   05/23/16 1436  Full code  Continuous     05/23/16 1435    Code Status History    Date Active Date Inactive Code Status Order ID Comments User Context   05/16/2016  4:58 PM 05/23/2016  2:35 PM Full Code [id number]  [name], MD Inpatient   10/11/2011  8:57 AM 10/13/2011  3:49 PM Full Code [id number]  [name], RN Inpatient    Advance Directive Documentation   Flowsheet Row Most Recent Value  Type of Advance Directive  Healthcare Power of Attorney, Living will  Pre-existing out of facility DNR order (yellow form or pink MOST form)  No data  "MOST" Form in Place?  No data        IV Access:    Peripheral IV   Procedures and diagnostic studies:   No results found.   Medical Consultants:    None.  Anti-Infectives:   Zosyn  Subjective:    Evan Avery he relates his pain is much better control.  Objective:    Vitals:   05/25/16 0400 05/25/16 0500 05/25/16 0600 05/25/16 0800  BP: (!) 154/61 (!) 152/66 (!) 149/60   Pulse: 69 69 71   Resp: 16 14 15    Temp:    98.3 F (36.8 C)  TempSrc:    Oral  SpO2: 92% 92% 94%   Weight:      Height:        Intake/Output Summary (Last 24 hours) at 05/25/16 0901 Last data filed at 05/25/16 0601  Gross per 24 hour  Intake          1351.67 ml  Output             1225 ml  Net           126.67 ml   Filed Weights   05/16/16 1626 05/16/16 1706  Weight: 113.4 kg (250 lb) 108.2 kg (238 lb 8.6 oz)    Exam: General exam: In no acute distress. Respiratory system: Good air movement and clear to auscultation. Cardiovascular system: S1 & S2 heard, RRR. Gastrointestinal system: Abdomen is Distended, colostomy in place, Less tenderness compared to yesterday. Central nervous system: Alert and oriented. No focal neurological deficits. Extremities: No pedal edema. Skin:  No rashes, lesions or ulcers Psychiatry: Judgement and insight appear normal. Mood & affect appropriate.    Data Reviewed:    Labs: Basic Metabolic Panel:  Recent Labs Lab 05/20/16 0542 05/22/16 1041 05/24/16 0343 05/25/16 0426  NA 135 138 136 137  K 3.5 3.6 5.0 3.7  CL 103 105 107 104  CO2 24 23 22 27   GLUCOSE 174* 151* 236* 229*  BUN 11 13 13 13   CREATININE 0.87 0.84 0.84 0.81  CALCIUM 8.2* 8.3* 8.2* 8.1*  MG  --   --   --  1.8  PHOS  --   --   --  2.2*   GFR Estimated Creatinine Clearance: 101.2 mL/min (by C-G formula based on SCr of 0.81 mg/dL). Liver Function Tests:  Recent Labs Lab 05/22/16 1041 05/25/16 0426  AST 28 23  ALT 22 19  ALKPHOS 72 50  BILITOT 0.9 0.6  PROT 5.7* 4.8*  ALBUMIN 2.3* 1.9*   No results for input(s): LIPASE, AMYLASE in the last 168 hours. No results for input(s): AMMONIA in the last 168 hours. Coagulation profile No results for input(s): INR, PROTIME in the last 168 hours.  CBC:  Recent Labs Lab 05/20/16 0542 05/22/16 1041 05/24/16 0343 05/25/16 0426  WBC 10.1 10.7* 14.4* 16.9*  NEUTROABS  --   --   --  14.9*  HGB 10.9* 10.6* 12.5* 9.6*  HCT 32.4* 31.6* 38.3* 29.0*  MCV 93.6 94.3 95.3 95.7  PLT 275 363 409* 374   Cardiac Enzymes: No results for input(s): CKTOTAL, CKMB, CKMBINDEX, TROPONINI in the last 168 hours. BNP (last 3 results) No results for input(s): PROBNP in the last 8760 hours. CBG:  Recent Labs Lab 05/24/16 1214 05/24/16 1652 05/24/16 2001 05/24/16 2356 05/25/16 0339  GLUCAP 226* 195* 192* 254* 235*   D-Dimer: No results for input(s): DDIMER in the last 72 hours. Hgb A1c: No results for input(s): HGBA1C in the last 72 hours. Lipid Profile:  Recent Labs  05/25/16 0426  TRIG 226*   Thyroid function studies: No results for input(s): TSH, T4TOTAL, T3FREE, THYROIDAB in the last 72 hours.  Invalid input(s): FREET3 Anemia work up: No results for input(s): VITAMINB12, FOLATE, FERRITIN, TIBC,  IRON, RETICCTPCT in the last 72 hours. Sepsis Labs:  Recent Labs Lab 05/20/16 0542 05/22/16 1041 05/24/16 0343 05/25/16 0426  WBC 10.1 10.7* 14.4* 16.9*   Microbiology Recent Results (from the past 240 hour(s))  Urine culture     Status: Abnormal   Collection Time: 05/16/16 12:23 PM  Result Value Ref Range Status   Specimen Description URINE, CLEAN CATCH  Final   Special Requests NONE  Final   Culture MULTIPLE SPECIES PRESENT, SUGGEST RECOLLECTION (A)  Final   Report Status 05/17/2016 FINAL  Final  Culture, blood (Routine  X 2) w Reflex to ID Panel     Status: None   Collection Time: 05/16/16  5:25 PM  Result Value Ref Range Status   Specimen Description BLOOD RIGHT ARM  Final   Special Requests BOTTLES DRAWN AEROBIC AND ANAEROBIC  10CC  Final   Culture   Final    NO GROWTH 5 DAYS Performed at [facility]    Report Status 05/21/2016 FINAL  Final  Culture, blood (Routine X 2) w Reflex to ID Panel     Status: None   Collection Time: 05/16/16  5:30 PM  Result Value Ref Range Status   Specimen Description BLOOD LEFT HAND  Final   Special Requests BOTTLES DRAWN AEROBIC AND ANAEROBIC  10CC  Final   Culture   Final    NO GROWTH 5 DAYS Performed at [facility]    Report Status 05/21/2016 FINAL  Final  Surgical pcr screen     Status: Abnormal   Collection Time: 05/23/16  7:59 AM  Result Value Ref Range Status   MRSA, PCR POSITIVE (A) NEGATIVE Final    Comment: RESULT CALLED TO, READ BACK BY AND VERIFIED WITH: [place] AT 1055 ON 08.01.17 BY SHUEA    Staphylococcus aureus POSITIVE (A) NEGATIVE Final    Comment:        The Xpert SA Assay (FDA approved for NASAL specimens in patients over 59 years of age), is one component of a comprehensive surveillance program.  Test performance has been validated by [facility] for patients greater than or equal to 44 year old. It is not intended to diagnose infection nor to guide or monitor treatment.       Medications:   . antiseptic oral rinse  7 mL Mouth Rinse q12n4p  . atenolol  50 mg Oral Daily  . [START ON 05/26/2016] chlorhexidine  15 mL Mouth Rinse BID  . Chlorhexidine Gluconate Cloth  6 each Topical Q0600  . famotidine (PEPCID) IV  20 mg Intravenous Q12H  . heparin subcutaneous  5,000 Units Subcutaneous Q8H  . HYDROmorphone   Intravenous Q4H  . insulin aspart  0-20 Units Subcutaneous Q4H  . insulin detemir  5 Units Subcutaneous QHS  . metoprolol  5 mg Intravenous Q6H  . mupirocin ointment  1 application Nasal BID  . piperacillin-tazobactam (ZOSYN)  IV  3.375 g Intravenous Q8H  . potassium phosphate IVPB (mmol)  20 mmol Intravenous Once  . sodium chloride flush  10-40 mL Intracatheter Q12H   Continuous Infusions: . sodium chloride    . TPN (CLINIMIX) Adult without lytes 40 mL/hr at 05/24/16 1818   And  . fat emulsion 240 mL (05/24/16 1818)  . Marland KitchenTPN (CLINIMIX-E) Adult     And  . fat emulsion      Time spent: 25 min   LOS: 9 days   Charlynne Cousins  [facility] Pager [phone]  *Please refer to [email or web address], password [email or web address] to get updated schedule on who will round on this patient, as hospitalists switch teams weekly. If 7PM-7AM, please contact night-coverage at [email or web address], password [email or web address] for any overnight needs.  05/25/2016, 9:01 AM

## 2016-05-25 NOTE — Progress Notes (Signed)
2 Days Post-Op  Subjective: More comfortable today. Wife in room.  Objective: Vital signs in last 24 hours: Temp:  [98.1 F (36.7 C)-99 F (37.2 C)] 98.3 F (36.8 C) (08/03 0800) Pulse Rate:  [27-78] 71 (08/03 0600) Resp:  [11-20] 19 (08/03 0800) BP: (138-170)/(60-81) 149/60 (08/03 0600) SpO2:  [91 %-99 %] 93 % (08/03 0800) Last BM Date: 05/23/16  Intake/Output from previous day: 08/02 0701 - 08/03 0700 In: 1351.7 [I.V.:1201.7; IV Piggyback:150] Out: [id number] [Urine:1225] Intake/Output this shift: No intake/output data recorded.  PE: General- In NAD Abdomen-slight firm and distended, open wound clean, colostomy edematous and viable with a small amount of gas in bag.  Lab Results:   Recent Labs  05/24/16 0343 05/25/16 0426  WBC 14.4* 16.9*  HGB 12.5* 9.6*  HCT 38.3* 29.0*  PLT 409* 374   BMET  Recent Labs  05/24/16 0343 05/25/16 0426  NA 136 137  K 5.0 3.7  CL 107 104  CO2 22 27  GLUCOSE 236* 229*  BUN 13 13  CREATININE 0.84 0.81  CALCIUM 8.2* 8.1*   PT/INR No results for input(s): LABPROT, INR in the last 72 hours. Comprehensive Metabolic Panel:    Component Value Date/Time   NA 137 05/25/2016 0426   NA 136 05/24/2016 0343   K 3.7 05/25/2016 0426   K 5.0 05/24/2016 0343   CL 104 05/25/2016 0426   CL 107 05/24/2016 0343   CO2 27 05/25/2016 0426   CO2 22 05/24/2016 0343   BUN 13 05/25/2016 0426   BUN 13 05/24/2016 0343   CREATININE 0.81 05/25/2016 0426   CREATININE 0.84 05/24/2016 0343   CREATININE 0.96 03/13/2016 0803   GLUCOSE 229 (H) 05/25/2016 0426   GLUCOSE 236 (H) 05/24/2016 0343   CALCIUM 8.1 (L) 05/25/2016 0426   CALCIUM 8.2 (L) 05/24/2016 0343   AST 23 05/25/2016 0426   AST 28 05/22/2016 1041   ALT 19 05/25/2016 0426   ALT 22 05/22/2016 1041   ALKPHOS 50 05/25/2016 0426   ALKPHOS 72 05/22/2016 1041   BILITOT 0.6 05/25/2016 0426   BILITOT 0.9 05/22/2016 1041   PROT 4.8 (L) 05/25/2016 0426   PROT 5.7 (L) 05/22/2016 1041   ALBUMIN  1.9 (L) 05/25/2016 0426   ALBUMIN 2.3 (L) 05/22/2016 1041     Studies/Results: No results found.  Anti-infectives: Anti-infectives    Start     Dose/Rate Route Frequency Ordered Stop   05/23/16 1530  piperacillin-tazobactam (ZOSYN) IVPB 3.375 g     3.375 g 12.5 mL/hr over 240 Minutes Intravenous Every 8 hours 05/23/16 1435     05/16/16 2200  piperacillin-tazobactam (ZOSYN) IVPB 3.375 g  Status:  Discontinued     3.375 g 12.5 mL/hr over 240 Minutes Intravenous Every 8 hours 05/16/16 1710 05/23/16 2152   05/16/16 1500  piperacillin-tazobactam (ZOSYN) IVPB 3.375 g     3.375 g 100 mL/hr over 30 Minutes Intravenous  Once 05/16/16 1435 05/16/16 1613   05/16/16 1430  piperacillin-tazobactam (ZOSYN) IVPB 4.5 g  Status:  Discontinued     4.5 g 200 mL/hr over 30 Minutes Intravenous  Once 05/16/16 1357 05/16/16 1435      Assessment Principal Problem:  Complicated diverticulitis with contained perforation and multiple abscesses s/p expl lap, drainage of abscesses, sigmoid colectomy and colostomy 05/23/16-has postop ileus as expected; no untoward events thus far  PC malnutrition-TPN started. :   Diabetes mellitus type 2, uncontrolled, with complications (HCC)   OSA on CPAP    LOS: 9 days  Plan: Transfer to floor.  Continue abxs.  Wait for ileus to resolve.  Possible VAC placement to wound tomorrow.   Cinda Hara J 05/25/2016

## 2016-05-25 NOTE — Progress Notes (Signed)
PARENTERAL NUTRITION CONSULT NOTE - Follow Up  Pharmacy Consult for TPN Indication: intolerance to enteral feedings  Allergies  Allergen Reactions  . Niacin And Related Other (See Comments)    Extreme fatigue    Patient Measurements: Height: 5\' 8"  (172.7 cm) Weight: 238 lb 8.6 oz (108.2 kg) IBW/kg (Calculated) : 68.4 Adjusted Body Weight: 100 kg Usual Weight: 113 kg on admit 05/16/16  Vital Signs: Temp: 98.7 F (37.1 C) (08/03 0336) Temp Source: Oral (08/02 2003) BP: 149/60 (08/03 0600) Pulse Rate: 71 (08/03 0600) Intake/Output from previous day: 08/02 0701 - 08/03 0700 In: 1351.7 [I.V.:1201.7; IV Piggyback:150] Out: [id number] [Urine:1225] Intake/Output from this shift: No intake/output data recorded.  Labs:  Recent Labs  05/22/16 1041 05/24/16 0343 05/25/16 0426  WBC 10.7* 14.4* 16.9*  HGB 10.6* 12.5* 9.6*  HCT 31.6* 38.3* 29.0*  PLT 363 409* 374     Recent Labs  05/22/16 1041 05/24/16 0343 05/25/16 0426  NA 138 136 137  K 3.6 5.0 3.7  CL 105 107 104  CO2 23 22 27   GLUCOSE 151* 236* 229*  BUN 13 13 13   CREATININE 0.84 0.84 0.81  CALCIUM 8.3* 8.2* 8.1*  MG  --   --  1.8  PHOS  --   --  2.2*  PROT 5.7*  --  4.8*  ALBUMIN 2.3*  --  1.9*  AST 28  --  23  ALT 22  --  19  ALKPHOS 72  --  50  BILITOT 0.9  --  0.6  PREALBUMIN 4.6*  --   --    Estimated Creatinine Clearance: 101.2 mL/min (by C-G formula based on SCr of 0.81 mg/dL).    Recent Labs  05/24/16 2001 05/24/16 2356 05/25/16 0339  GLUCAP 192* 254* 235*    Medical History: Past Medical History:  Diagnosis Date  . CAD (coronary artery disease)    4 stents in right artery  . Complex tear of medial meniscus of left knee as current injury 07/26/2012   [place]  . Diabetes mellitus   . Glaucoma 2006   on drops, followed by [place]  . Hyperlipidemia   . Hypertension   . Myocardial infarction ([place])    1991  . Sleep apnea    uses CPAP regularly    Medications:  Scheduled:  . antiseptic oral  rinse  7 mL Mouth Rinse q12n4p  . atenolol  50 mg Oral Daily  . [START ON 05/26/2016] chlorhexidine  15 mL Mouth Rinse BID  . Chlorhexidine Gluconate Cloth  6 each Topical Q0600  . famotidine (PEPCID) IV  20 mg Intravenous Q12H  . heparin subcutaneous  5,000 Units Subcutaneous Q8H  . HYDROmorphone   Intravenous Q4H  . insulin aspart  0-15 Units Subcutaneous Q4H  . insulin detemir  5 Units Subcutaneous QHS  . metoprolol  5 mg Intravenous Q6H  . mupirocin ointment  1 application Nasal BID  . piperacillin-tazobactam (ZOSYN)  IV  3.375 g Intravenous Q8H  . potassium phosphate IVPB (mmol)  20 mmol Intravenous Once  . sodium chloride flush  10-40 mL Intracatheter Q12H   Infusions:  . dextrose 5 % and 0.9% NaCl 50 mL/hr at 05/24/16 1740  . TPN (CLINIMIX) Adult without lytes 40 mL/hr at 05/24/16 1818   And  . fat emulsion 240 mL (05/24/16 1818)   PRN: diphenhydrAMINE **OR** diphenhydrAMINE, naloxone **AND** sodium chloride flush, ondansetron (ZOFRAN) IV, phenol, sodium chloride flush  Insulin Requirements: 16 units Novolog SSI since TPN started + 8 units  in TPN  Current Nutrition: NPO  IVF: D5NS at 50 ml/hr  Central access: PICC placed 8/2 TPN start date:  8/2  ASSESSMENT                                                                                                          HPI: 70 y/o with h/o CAD and 4 stents, OSA on CPAP, DM, HTN, HLD who presents for abdominal pain & constipation. Found to have an intra-abdominal abscess on CT scan. IR unable to place drain as abscess is quite deep.  Initally, plan was to continue IV antibiotics in hopes to avoid surgery. Repeat CT on 7/31 showed many new small abscessed, prior abscess improving.  Significant events:  8/1: Expl lap, drainage of abscesses, sigmoid colectomy and colostomy  8/2: Abdomen-slight firm and distended, colostomy edematous and viable with no output. Place PICC and begin TPN per Pharmacy.  Today:    Glucose: remains > goal  of less than 150 - Levemir started 8/2 (PTA amaryl & metformin on hold while NPO)  Electrolytes: none in TPN  K+ at upper end of goal 8/2, now WNL  Corrected Ca WNL  Phos low  Renal: SCr WNL  LFTs: WNL  TGs: in progress  Prealbumin: 4.6 (7/31), in progress  NUTRITIONAL GOALS                                                                                             RD recs: 1900-2100 kcal, 110-120g protein per day Clinimix 5/15 at a goal rate of 31ml/hr + 20% fat emulsion at 69ml/hr to provide: 108g/day protein, 2013Kcal/day.  PLAN                                                                                                                          Now:  Potassium phosphate 42mmol IV x 1  At 1800 today:  Continue Clinimix 5/15 at 23ml/hr but add electrolytes - will not advance rate until CBGs better controlled.  20% fat emulsion at 46ml/hr.  Plan to advance as tolerated to the goal rate.  TPN to contain standard multivitamins and trace elements.  Continue IVF at 13  ml/hr but change to NS only (remove dextrose to help sugars)  Continue 8 units insulin to TPN per day  Continue SSI q4h and increase to resistant scale; continue Levemir 5 units qhs .   TPN lab panels on Mondays & Thursdays.  F/u daily.  Peggyann Juba, PharmD, BCPS Pager: [phone] 05/25/2016,7:43 AM

## 2016-05-25 NOTE — Progress Notes (Signed)
Nutrition Follow-up  DOCUMENTATION CODES:   Obesity unspecified  INTERVENTION:  - Continue TPN per pharmacy. - RD will follow-up 8/4.  NUTRITION DIAGNOSIS:   Inadequate oral intake related to inability to eat as evidenced by NPO status. -ongoing.  GOAL:   Patient will meet greater than or equal to 90% of their needs -unmet with TPN not yet at goal.   MONITOR:   Weight trends, Labs, Skin, I & O's, Other (Comment) (TPN regimen)  REASON FOR ASSESSMENT:   New TPN/TNA  ASSESSMENT:    70 y.o. male with medical history significant of CAD with a stent in 1992, obesity and OSA came to the hospital complaining about abdominal pain. His symptoms started about 10 days ago, he also had obstipation and tried multiple OTC laxatives without success. About 45 days ago started to have chills and he came in this morning because of the pain and chills worsens. CT scan showed intra-abdominal abscess.  8/3 No new weight since admission. Pt has been NPO since 8/1 and is now POD #2 ex lap, drainage of abscesses, sigmoid colectomy, and colostomy. PICC placed and TPN started per pharmacy yesterday evening. Per surgery note this AM, plan to continue TPN, possible wound VAC placement 8/4. Also per note: Abdomen-slight firm and distended, open wound clean, colostomy edematous and viable with a small amount of gas in bag.  Unable to talk with pt at this time or visualize drainage from NGT as he is being transferred to the 5th floor at this time.   Per pharmacy, plan for order this evening for Clinimix E 5/15 @ 40 mL/hr (will not advance rate until CBGs controlled) with 20% lipids @ 10 mL/hr. This regimen provides 48 grams of protein (44% minimum estimated protein need), 1162 kcal (61% minimum estimated kcal need).   Goal for TPN: Clinimix 5/15 (E?) @ 90 mL/hr with 20% lipids @ 10 mL/hr which will provide 108 grams of protein (98% minimum estimated protein need), 2014 kcal (100% estimated kcal  need).  Medications reviewed; sliding scale Novolog, 10 units Levemir BID, PRN Zofran, 20 mmol IV KPhos x1 dose today.  Labs reviewed; CBGs: 213-240 mg/dL today, Ca: 8.1 mg/dL, Phos: 2.2 mg/dL, triglycerides: 226 mg/dL, LFTs WDL.  IVF: NS @ 50 mL/hr.    8/1 - Pt on CLD since diet advancement on 7/26 and chart review indicates 100% completion of lunch and dinner 7/26 and 7/27.  - Pt has been NPO since midnight for surgery today.  - Notes indicate pt underwent CT with contrast with findings of 5 new abscesses.  - Notes indicate ongoing abdominal pain/pressure.  - Pt with several BMs after use of Dulcolax suppository.  - Per Surgery note yesterday at 1010, exploratory laparotomy with drainage on intra-abdominal abscess, bowel resection, possible colostomy planned for today.  IVF: NS @ 75 mL/hr; LR @ 100 mL/hr.    7/26 - Diet advanced from NPO to CLD prior to lunch; at time of RD visit pt had consumed 100% of [place] ice and chicken broth and was sipping on soda.  - He is still experiencing abdominal pain but denies lunch intake exacerbating pain.  - PTA abdominal pain had been occurring for 10-12 days and was severe, stabbing in nature initially and then was combined with abdominal cramping. - During 10-12 period he has also experienced constipation.  - He reports only 2 instances of nausea during this time frame, no emesis with nausea.  - Pt reports that he was "drinking gallons of water each  day" PTA and solid foods were limited to chicken noodle soup and [place] sausage on a hotdog but with condiments. - Per notes, CT abd showed 5 cm upper pelvic abscess; unable to be drained due to location. - Plan to repeat CT in 2-3 days and provide antibiotics.  - Will order Boost Breeze on CLD to supplement protein. - Physical assessment shows no muscle or fat wasting at this time.  - Pt unsure of weight trend during 10-12 days PTA.  - Per chart review, pt has lost 9 lbs (3.6% body weight) in  the past 2 months which is not significant for time frame.  . IVF: NS @ 75 mL/hr.   Diet Order:  Diet NPO time specified Except for: Ice Chips Diet NPO time specified Except for: [employer], Sips with Meds TPN (CLINIMIX) Adult without lytes TPN (CLINIMIX-E) Adult  Skin:  Wound (see comment) (Abdominal incision from 05/23/16)  Last BM:  8/1  Height:   Ht Readings from Last 1 Encounters:  05/16/16 5\' 8"  (1.727 m)    Weight:   Wt Readings from Last 1 Encounters:  05/16/16 238 lb 8.6 oz (108.2 kg)    Ideal Body Weight:  70 kg (kg)  BMI:  Body mass index is 36.27 kg/m.  Estimated Nutritional Needs:   Kcal:  1900-2100  Protein:  110-120 grams  Fluid:  >/= 2 L/day  EDUCATION NEEDS:   No education needs identified at this time    Jarome Matin, MS, RD, LDN Inpatient Clinical Dietitian Pager # [phone] After hours/weekend pager # [phone]

## 2016-05-25 NOTE — Progress Notes (Signed)
Report to Caryl Pina RN to assume care of patient at admit to 1532.

## 2016-05-25 NOTE — Consult Note (Signed)
[place] Nurse ostomy consult note Stoma type/location: LUQ Colostomy Stomal assessment/size: 2 inch oval, red, slightly elevated, raised Peristomal assessment: intact, clear Treatment options for stomal/peristomal skin: skin barrier ring Output scant serosanguinous Ostomy pouching: 2pc.  2 and 3/4 inches with skin barrier ring Education provided: Patient, wife and son present for first ostomy pouch change; instruction provided on ostomy characteristics, pouch characteristics, importance of measuring stoma and fit of pouiching system.  All are reassured that Surgical unit staff can manage patient now that they are planning on transfer from ICU. Wife reassured about ostomy odors and care.  Demonstration of Lock and Roll closure, cleaning of tail closure with toilet paper "wick". Enrolled patient in [place] program: No WOC nursing team will follow, and will remain available to this patient, the nursing, surgical and medical teams.   Thanks, Maudie Flakes, MSN, RN, [place], Arther Abbott  Pager# [phone]

## 2016-05-26 DIAGNOSIS — E118 Type 2 diabetes mellitus with unspecified complications: Secondary | ICD-10-CM | POA: Diagnosis not present

## 2016-05-26 DIAGNOSIS — K913 Postprocedural intestinal obstruction: Secondary | ICD-10-CM | POA: Diagnosis not present

## 2016-05-26 DIAGNOSIS — I10 Essential (primary) hypertension: Secondary | ICD-10-CM | POA: Diagnosis not present

## 2016-05-26 DIAGNOSIS — K5720 Diverticulitis of large intestine with perforation and abscess without bleeding: Secondary | ICD-10-CM | POA: Diagnosis not present

## 2016-05-26 DIAGNOSIS — G4733 Obstructive sleep apnea (adult) (pediatric): Secondary | ICD-10-CM | POA: Diagnosis not present

## 2016-05-26 DIAGNOSIS — A419 Sepsis, unspecified organism: Secondary | ICD-10-CM | POA: Diagnosis not present

## 2016-05-26 LAB — COMPREHENSIVE METABOLIC PANEL
ALT: 16 U/L — AB (ref 17–63)
AST: 22 U/L (ref 15–41)
Albumin: 1.8 g/dL — ABNORMAL LOW (ref 3.5–5.0)
Alkaline Phosphatase: 51 U/L (ref 38–126)
Anion gap: 6 (ref 5–15)
BILIRUBIN TOTAL: 0.5 mg/dL (ref 0.3–1.2)
BUN: 17 mg/dL (ref 6–20)
CO2: 28 mmol/L (ref 22–32)
CREATININE: 0.77 mg/dL (ref 0.61–1.24)
Calcium: 8.1 mg/dL — ABNORMAL LOW (ref 8.9–10.3)
Chloride: 104 mmol/L (ref 101–111)
GFR calc Af Amer: >60 mL/min (ref >60)
Glucose, Bld: 159 mg/dL — ABNORMAL HIGH (ref 65–99)
Potassium: 3.3 mmol/L — ABNORMAL LOW (ref 3.5–5.1)
Sodium: 138 mmol/L (ref 135–145)
TOTAL PROTEIN: 5.2 g/dL — AB (ref 6.5–8.1)

## 2016-05-26 LAB — GLUCOSE, CAPILLARY
GLUCOSE-CAPILLARY: 148 mg/dL — AB (ref 65–99)
GLUCOSE-CAPILLARY: 169 mg/dL — AB (ref 65–99)
Glucose-Capillary: 144 mg/dL — ABNORMAL HIGH (ref 65–99)
Glucose-Capillary: 161 mg/dL — ABNORMAL HIGH (ref 65–99)
Glucose-Capillary: 157 mg/dL — ABNORMAL HIGH (ref 65–99)
Glucose-Capillary: 162 mg/dL — ABNORMAL HIGH (ref 65–99)

## 2016-05-26 LAB — PHOSPHORUS: Phosphorus: 2.5 mg/dL (ref 2.5–4.6)

## 2016-05-26 LAB — MAGNESIUM: MAGNESIUM: 1.9 mg/dL (ref 1.7–2.4)

## 2016-05-26 MED ORDER — SODIUM CHLORIDE 0.9 % IV SOLN
INTRAVENOUS | Status: DC
  Administered 2016-05-26: 30 mL/h via INTRAVENOUS

## 2016-05-26 MED ORDER — DIPHENHYDRAMINE HCL 12.5 MG/5ML PO ELIX: 12.5000 mg | ORAL_SOLUTION | Freq: Four times a day (QID) | ORAL | Status: DC | PRN

## 2016-05-26 MED ORDER — FAT EMULSION 20 % IV EMUL
240.0000 mL | INTRAVENOUS | Status: AC
  Administered 2016-05-26: 240 mL via INTRAVENOUS
  Filled 2016-05-26: qty 250

## 2016-05-26 MED ORDER — POTASSIUM CHLORIDE 10 MEQ/100ML IV SOLN
INTRAVENOUS | Status: AC
  Administered 2016-05-26: 10 meq
  Filled 2016-05-26: qty 100

## 2016-05-26 MED ORDER — POTASSIUM CHLORIDE 10 MEQ/100ML IV SOLN
INTRAVENOUS | Status: AC
  Administered 2016-05-26: 10 meq via INTRAVENOUS
  Filled 2016-05-26: qty 100

## 2016-05-26 MED ORDER — POTASSIUM CHLORIDE 10 MEQ/100ML IV SOLN
10.0000 meq | INTRAVENOUS | Status: AC
  Administered 2016-05-26 (×3): 10 meq via INTRAVENOUS
  Filled 2016-05-26 (×2): qty 100

## 2016-05-26 MED ORDER — NALOXONE HCL 0.4 MG/ML IJ SOLN: 0.4000 mg | INTRAMUSCULAR | Status: DC | PRN

## 2016-05-26 MED ORDER — CLINIMIX E/DEXTROSE (5/15) 5 % IV SOLN
INTRAVENOUS | Status: AC
Start: 2016-05-26 — End: 2016-05-27
  Administered 2016-05-26: 17:00:00 via INTRAVENOUS
  Filled 2016-05-26: qty 1440

## 2016-05-26 MED ORDER — SODIUM CHLORIDE 0.9% FLUSH: 9.0000 mL | INTRAVENOUS | Status: DC | PRN

## 2016-05-26 MED ORDER — DIPHENHYDRAMINE HCL 50 MG/ML IJ SOLN: 12.5000 mg | Freq: Four times a day (QID) | INTRAMUSCULAR | Status: DC | PRN

## 2016-05-26 MED ORDER — MORPHINE SULFATE 2 MG/ML IV SOLN
INTRAVENOUS | Status: DC
  Filled 2016-05-26: qty 25

## 2016-05-26 MED ORDER — ONDANSETRON HCL 4 MG/2ML IJ SOLN
4.0000 mg | Freq: Four times a day (QID) | INTRAMUSCULAR | Status: DC | PRN
  Administered 2016-05-28: 4 mg via INTRAVENOUS
  Filled 2016-05-26: qty 2

## 2016-05-26 MED ORDER — METOPROLOL TARTRATE 5 MG/5ML IV SOLN: 5.0000 mg | Freq: Four times a day (QID) | INTRAVENOUS | Status: DC | PRN

## 2016-05-26 MED ORDER — ONDANSETRON HCL 4 MG/2ML IJ SOLN: 4.0000 mg | Freq: Four times a day (QID) | INTRAMUSCULAR | Status: DC | PRN

## 2016-05-26 MED ORDER — DIPHENHYDRAMINE HCL 50 MG/ML IJ SOLN
12.5000 mg | Freq: Four times a day (QID) | INTRAMUSCULAR | Status: DC | PRN
Start: 2016-05-26 — End: 2016-05-26

## 2016-05-26 MED ORDER — HYDROMORPHONE 1 MG/ML IV SOLN
INTRAVENOUS | Status: DC
  Administered 2016-05-26: 10:00:00 via INTRAVENOUS
  Administered 2016-05-26: 2 mg via INTRAVENOUS
  Administered 2016-05-26: 3 mg via INTRAVENOUS
  Administered 2016-05-27: 1.8 mg via INTRAVENOUS
  Administered 2016-05-27: 3 mg via INTRAVENOUS
  Administered 2016-05-27: 2 mg via INTRAVENOUS
  Administered 2016-05-27: 2.4 mg via INTRAVENOUS
  Administered 2016-05-27 – 2016-05-28 (×2): 3.6 mg via INTRAVENOUS
  Administered 2016-05-28: 1.7 mg via INTRAVENOUS
  Administered 2016-05-28: 02:00:00 via INTRAVENOUS
  Administered 2016-05-28: 2.2 mg via INTRAVENOUS
  Administered 2016-05-28: 2.8 mg via INTRAVENOUS
  Administered 2016-05-28: 1.4 mg via INTRAVENOUS
  Administered 2016-05-28: 1.5 mL via INTRAVENOUS
  Administered 2016-05-29: 3.8 mg via INTRAVENOUS
  Administered 2016-05-29: 1.6 mg via INTRAVENOUS
  Administered 2016-05-29 (×2): 1.8 mg via INTRAVENOUS
  Administered 2016-05-29: 1 mg via INTRAVENOUS
  Administered 2016-05-29: 2.6 mg via INTRAVENOUS
  Administered 2016-05-30 (×2): 2.2 mg via INTRAVENOUS
  Administered 2016-05-30: 1 mg via INTRAVENOUS
  Filled 2016-05-26 (×3): qty 25

## 2016-05-26 NOTE — Progress Notes (Addendum)
PARENTERAL NUTRITION CONSULT NOTE - Follow Up  Pharmacy Consult for TPN Indication: intolerance to enteral feedings  Allergies  Allergen Reactions  . Niacin And Related Other (See Comments)    Extreme fatigue    Patient Measurements: Height: 5\' 8"  (172.7 cm) Weight: 238 lb 8.6 oz (108.2 kg) IBW/kg (Calculated) : 68.4 Adjusted Body Weight: 100 kg Usual Weight: 113 kg on admit 05/16/16  Vital Signs: Temp: 98 F (36.7 C) (08/04 0928) Temp Source: Oral (08/04 0928) BP: 143/57 (08/04 0928) Pulse Rate: 66 (08/04 0928) Intake/Output from previous day: 08/03 0701 - 08/04 0700 In: 1604.2 [I.V.:1104.2; NG/GT:30; IV Piggyback:470] Out: 2150 [Urine:1300; Emesis/NG output:850] Intake/Output from this shift: Total I/O In: -  Out: 200 [Urine:200]  Labs:  Recent Labs  05/24/16 0343 05/25/16 0426  WBC 14.4* 16.9*  HGB 12.5* 9.6*  HCT 38.3* 29.0*  PLT 409* 374     Recent Labs  05/24/16 0343 05/25/16 0426 05/26/16 0432  NA 136 137 138  K 5.0 3.7 3.3*  CL 107 104 104  CO2 22 27 28   GLUCOSE 236* 229* 159*  BUN 13 13 17   CREATININE 0.84 0.81 0.77  CALCIUM 8.2* 8.1* 8.1*  MG  --  1.8 1.9  PHOS  --  2.2* 2.5  PROT  --  4.8* 5.2*  ALBUMIN  --  1.9* 1.8*  AST  --  23 22  ALT  --  19 16*  ALKPHOS  --  50 51  BILITOT  --  0.6 0.5  PREALBUMIN  --  5.1*  --   TRIG  --  226*  --    Estimated Creatinine Clearance: 102.4 mL/min (by C-G formula based on SCr of 0.8 mg/dL).    Recent Labs  05/25/16 2329 05/26/16 0329 05/26/16 0823  GLUCAP 145* 148* 169*    Medical History: Past Medical History:  Diagnosis Date  . CAD (coronary artery disease)    4 stents in right artery  . Complex tear of medial meniscus of left knee as current injury 07/26/2012   [place]  . Diabetes mellitus   . Glaucoma 2006   on drops, followed by [place]  . Hyperlipidemia   . Hypertension   . Myocardial infarction ([place])    1991  . Sleep apnea    uses CPAP regularly    Medications:   Scheduled:  . antiseptic oral rinse  7 mL Mouth Rinse q12n4p  . atenolol  50 mg Oral Daily  . chlorhexidine  15 mL Mouth Rinse BID  . Chlorhexidine Gluconate Cloth  6 each Topical Q0600  . famotidine (PEPCID) IV  20 mg Intravenous Q12H  . heparin subcutaneous  5,000 Units Subcutaneous Q8H  . HYDROmorphone   Intravenous Q4H  . insulin aspart  0-20 Units Subcutaneous Q4H  . insulin detemir  10 Units Subcutaneous BID  . metoprolol  5 mg Intravenous Q6H  . mupirocin ointment  1 application Nasal BID  . piperacillin-tazobactam (ZOSYN)  IV  3.375 g Intravenous Q8H   Infusions:  . sodium chloride 50 mL/hr at 05/25/16 0845  . Marland KitchenTPN (CLINIMIX-E) Adult 40 mL/hr at 05/25/16 1751   And  . fat emulsion 240 mL (05/25/16 1751)   PRN: diphenhydrAMINE **OR** diphenhydrAMINE, naloxone **AND** sodium chloride flush, ondansetron (ZOFRAN) IV, phenol, zolpidem  Insulin Requirements: 34 units Novolog SSI yesterday + 8 units in TPN + Levemir 10 mg BID  Current Nutrition: NPO  IVF: NS at 50 ml/hr  Central access: PICC placed 8/2 TPN start date:  8/2  ASSESSMENT                                                                                                          HPI: 70 y/o with h/o CAD and 4 stents, OSA on CPAP, DM, HTN, HLD who presents for abdominal pain & constipation. Found to have an intra-abdominal abscess on CT scan. IR unable to place drain as abscess is quite deep.  Initally, plan was to continue IV antibiotics in hopes to avoid surgery. Repeat CT on 7/31 showed many new small abscessed, prior abscess improving.  Significant events:  8/1: Expl lap, drainage of abscesses, sigmoid colectomy and colostomy  8/2: Abdomen-slight firm and distended, colostomy edematous and viable with no output. Place PICC and begin TPN per Pharmacy. 8/3: switch to Clinimix with lytes  Today:    Glucose: not well controlled but now < 200 and several readings in 140s - Levemir started 8/2 (PTA amaryl &  metformin on hold while NPO) - MD titrating  Electrolytes: K low despite addition of lytes to TPN, Phos improved to wnl; all others stable wnl  Renal: SCr WNL  LFTs: WNL  TGs: mildly elevated  Prealbumin: low but improved  NUTRITIONAL GOALS                                                                                             RD recs: 1900-2100 kcal, 110-120g protein per day Clinimix 5/15 at a goal rate of 15ml/hr + 20% fat emulsion at 95ml/hr to provide: 108g/day protein, 2013Kcal/day.  PLAN                                                                                                                          Now:  KCl IV 10 mEq x 4  At 1800 today:  Advance Clinimix E 5/15 to 60 ml/hr; will proceed slowly as CBGs much better but still not at goal  20% fat emulsion at 56ml/hr.  Plan to advance as tolerated to the goal rate.  TPN to contain standard multivitamins and trace elements.  Reduce IVF to 30 ml/hr  Increase insulin in TPN to 12 units/day  Continue SSI q4h  resistant scale; Levemir 10 units BID per MD  BMP, Mg, Phos tomorrow.  TPN lab panels on Mondays & Thursdays.  F/u daily.  Reuel Boom, PharmD, BCPS Pager: [phone] 05/26/2016, 10:21 AM

## 2016-05-26 NOTE — Care Management Important Message (Signed)
Important Message  Patient Details IM Letter given to Suzanne/Case Manager to present to Patient Name: CAEDYN MATHENIA MRN: [id number] Date of Birth: 04-Jun-1946   Medicare Important Message Given:  Yes    [name] 05/26/2016, 10:04 AMImportant Message  Patient Details  Name: LOHGAN ALGHAMDI MRN: [id number] Date of Birth: 09-30-1946   Medicare Important Message Given:  Yes    [name] 05/26/2016, 10:03 AM Important Message  Patient Details  Name: TYLEE BIERS MRN: [id number] Date of Birth: 02/15/1946   Medicare Important Message Given:  Yes    [name] 05/30/2016, 12:35 PM

## 2016-05-26 NOTE — Progress Notes (Signed)
3 Days Post-Op  Subjective: Had Ambien last night and started hallucinating, pulled ng tube out.  Objective: Vital signs in last 24 hours: Temp:  [97.5 F (36.4 C)-99.2 F (37.3 C)] 98 F (36.7 C) (08/04 0928) Pulse Rate:  [62-87] 66 (08/04 0928) Resp:  [10-20] 16 (08/04 0928) BP: (140-169)/(57-77) 143/57 (08/04 0928) SpO2:  [91 %-96 %] 96 % (08/04 0928) Last BM Date: 05/23/16  Intake/Output from previous day: 08/03 0701 - 08/04 0700 In: 1604.2 [I.V.:1104.2; NG/GT:30; IV Piggyback:470] Out: 2150 [Urine:1300; Emesis/NG output:850] Intake/Output this shift: Total I/O In: -  Out: 200 [Urine:200]  PE: General- In NAD Abdomen-slight firm and distended, hypoactive bowel sounds, open wound clean, colostomy edematous and viable with a small amount of thin liquid in bag.  Lab Results:   Recent Labs  05/24/16 0343 05/25/16 0426  WBC 14.4* 16.9*  HGB 12.5* 9.6*  HCT 38.3* 29.0*  PLT 409* 374   BMET  Recent Labs  05/25/16 0426 05/26/16 0432  NA 137 138  K 3.7 3.3*  CL 104 104  CO2 27 28  GLUCOSE 229* 159*  BUN 13 17  CREATININE 0.81 0.77  CALCIUM 8.1* 8.1*   PT/INR No results for input(s): LABPROT, INR in the last 72 hours. Comprehensive Metabolic Panel:    Component Value Date/Time   NA 138 05/26/2016 0432   NA 137 05/25/2016 0426   K 3.3 (L) 05/26/2016 0432   K 3.7 05/25/2016 0426   CL 104 05/26/2016 0432   CL 104 05/25/2016 0426   CO2 28 05/26/2016 0432   CO2 27 05/25/2016 0426   BUN 17 05/26/2016 0432   BUN 13 05/25/2016 0426   CREATININE 0.77 05/26/2016 0432   CREATININE 0.81 05/25/2016 0426   CREATININE 0.96 03/13/2016 0803   GLUCOSE 159 (H) 05/26/2016 0432   GLUCOSE 229 (H) 05/25/2016 0426   CALCIUM 8.1 (L) 05/26/2016 0432   CALCIUM 8.1 (L) 05/25/2016 0426   AST 22 05/26/2016 0432   AST 23 05/25/2016 0426   ALT 16 (L) 05/26/2016 0432   ALT 19 05/25/2016 0426   ALKPHOS 51 05/26/2016 0432   ALKPHOS 50 05/25/2016 0426   BILITOT 0.5 05/26/2016  0432   BILITOT 0.6 05/25/2016 0426   PROT 5.2 (L) 05/26/2016 0432   PROT 4.8 (L) 05/25/2016 0426   ALBUMIN 1.8 (L) 05/26/2016 0432   ALBUMIN 1.9 (L) 05/25/2016 0426     Studies/Results: No results found.  Anti-infectives: Anti-infectives    Start     Dose/Rate Route Frequency Ordered Stop   05/23/16 1530  piperacillin-tazobactam (ZOSYN) IVPB 3.375 g     3.375 g 12.5 mL/hr over 240 Minutes Intravenous Every 8 hours 05/23/16 1435     05/16/16 2200  piperacillin-tazobactam (ZOSYN) IVPB 3.375 g  Status:  Discontinued     3.375 g 12.5 mL/hr over 240 Minutes Intravenous Every 8 hours 05/16/16 1710 05/23/16 2152   05/16/16 1500  piperacillin-tazobactam (ZOSYN) IVPB 3.375 g     3.375 g 100 mL/hr over 30 Minutes Intravenous  Once 05/16/16 1435 05/16/16 1613   05/16/16 1430  piperacillin-tazobactam (ZOSYN) IVPB 4.5 g  Status:  Discontinued     4.5 g 200 mL/hr over 30 Minutes Intravenous  Once 05/16/16 1357 05/16/16 1435      Assessment Principal Problem:  Complicated diverticulitis with contained perforation and multiple abscesses s/p expl lap, drainage of abscesses, sigmoid colectomy and colostomy 05/23/16- postop ileus remains PC malnutrition-Prealbumin 5.1; on TPN :   Diabetes mellitus type 2, uncontrolled, with  complications (HCC)   OSA on CPAP    LOS: 10 days   Plan: Place VAC on wound.  Reinsert ng tube for n/v.  Wait for ileus to resolve.   Gee Habig J 05/26/2016

## 2016-05-26 NOTE — Consult Note (Signed)
[place] Nurse wound consult note Reason for Consult: Initial NPWT dressing per Dr. Bertrum Sol request Wound type: Surgical Pressure Ulcer POA: No Measurement: 20cm x 4cm x 0.5 Wound bed:100% yellow (adipose tissue) Drainage (amount, consistency, odor) minimal serous, no odor Periwound:intact Dressing procedure/placement/frequency: Removed saline dressing, applied one piece black foam, covered with drape.  Attached to 196mmhg continue negative pressure, and immediate seal was achieved. Pt tolerated well.   Hoyle Barr, RN-C, WTA-C Certified Wound Treatment Associate

## 2016-05-26 NOTE — Progress Notes (Signed)
Nutrition Follow-up  DOCUMENTATION CODES:   Obesity unspecified  INTERVENTION:  - TPN per pharmacy. - RD will follow-up 8/7.  NUTRITION DIAGNOSIS:   Inadequate oral intake related to inability to eat as evidenced by NPO status. -ongoing  GOAL:   Patient will meet greater than or equal to 90% of their needs -unmet with current TPN rate.   MONITOR:   Weight trends, Labs, Skin, I & O's, Other (Comment) (TPN regimen)  ASSESSMENT:    70 y.o. male with medical history significant of CAD with a stent in 1992, obesity and OSA came to the hospital complaining about abdominal pain. His symptoms started about 10 days ago, he also had obstipation and tried multiple OTC laxatives without success. About 45 days ago started to have chills and he came in this morning because of the pain and chills worsens. CT scan showed intra-abdominal abscess.  8/4 No new weight since admission and pt continues to be NPO. Per surgery note this PM, pt pulled NGT and was having hallucinations overnight. Plan for NGT replacement and for placement of abdominal wound VAC. No plan for diet advancement at this time.   Family at bedside at time of RD visit; many questions surrounding TPN and diet advancement, all questions addressed and answered and informed family that diet advancement will occur per surgery discretion.   Goal for TPN remains the same as outlined below with slow advancement to ensure adequate glycemic control, per pharmacy note. Pt currently receiving Clinimix E 5/15 @ 50 mL/hr with 20% lipids @ 10 mL/hr. Per pharmacy note this AM, plan to increase to Clinimix E 5/15 @ 60 mL/hr with 20% lipids @ 10 mL/hr and 10 units insulin in TPN bag. This regimen will provide 72 grams of protein (65% minimum estimated protein need), 1502 kcal (79% minimum estimated kcal need).   Pt unable to meet estimated nutrition needs at this time; RD will follow-up 8/7.  Medications reviewed; Labs reviewed; CBGs: 148 and 169  mg/dL today, K: 3.3 mmol/L, Ca: 8.4 mg/dL, LFTs WDL, triglycerides elevated: 226 mg/dL.   8/3 - Pt has been NPO since 8/1 and is now POD #2 ex lap, drainage of abscesses, sigmoid colectomy, and colostomy.  - PICC placed and TPN started per pharmacy yesterday evening.  - Per surgery note this AM, plan to continue TPN, possible wound VAC placement 8/4.  - Also per note: Abdomen-slight firm and distended, open wound clean, colostomy edematous and viable with a small amount of gas in bag. - Unable to talk with pt at this time or visualize drainage from NGT as he is being transferred to the 5th floor at this time.  - Per pharmacy, plan for order this evening for Clinimix E 5/15 @ 40 mL/hr (will not advance rate until CBGs controlled) with 20% lipids @ 10 mL/hr. This regimen provides 48 grams of protein (44% minimum estimated protein need), 1162 kcal (61% minimum estimated kcal need).   Goal for TPN: Clinimix 5/15 (E?) @ 90 mL/hr with 20% lipids @ 10 mL/hr which will provide 108 grams of protein (98% minimum estimated protein need), 2014 kcal (100% estimated kcal need).  IVF: NS @ 50 mL/hr.   8/1 - Pt on CLD since diet advancement on 7/26 and chart review indicates 100% completion of lunch and dinner 7/26 and 7/27.  - Pt has been NPO since midnight for surgery today.  - Notes indicate pt underwent CT with contrast with findings of 5 new abscesses.  - Notes indicate ongoing  abdominal pain/pressure.  - Pt with several BMs after use of Dulcolax suppository.  - Per Surgery note yesterday at 1010, exploratory laparotomy with drainage on intra-abdominal abscess, bowel resection, possible colostomyplanned for today.  IVF:NS @ 75 mL/hr; LR @ 100 mL/hr.     Diet Order:  Diet NPO time specified Except for: Ice Chips Diet NPO time specified Except for: [employer], Sips with Meds TPN (CLINIMIX-E) Adult .TPN (CLINIMIX-E) Adult  Skin:  Wound (see comment) (Abdominal incision from 05/23/16)  Last  BM:  8/1  Height:   Ht Readings from Last 1 Encounters:  05/16/16 5\' 8"  (1.727 m)    Weight:   Wt Readings from Last 1 Encounters:  05/16/16 238 lb 8.6 oz (108.2 kg)    Ideal Body Weight:  70 kg (kg)  BMI:  Body mass index is 36.27 kg/m.  Estimated Nutritional Needs:   Kcal:  1900-2100  Protein:  110-120 grams  Fluid:  >/= 2 L/day  EDUCATION NEEDS:   No education needs identified at this time    Jarome Matin, MS, RD, LDN Inpatient Clinical Dietitian Pager # [phone] After hours/weekend pager # [phone]

## 2016-05-26 NOTE — Progress Notes (Addendum)
Bed alarm was ringing & patient was attempting to get out of bed. Patient was alert & oriented x2 with some agitation.  NG tube was pulled out. This nurse and two other staff assisted patient in geri chair.VS T-99.2 P-73 R-17 BP- 150/57   were as followed.  Wife Chapin Brester was called @ [phone] & this nurse explained current situation. Patient requested to speak with spouse as well. Patient currently up in chair with chair alarm on, calm & apologetic but refusing to put NG in. MD paged stated "maintain NPO status & document refusal"  Monitoring will be contd

## 2016-05-26 NOTE — Progress Notes (Signed)
[facility] PROGRESS NOTE    Progress Note  Evan Avery  [id number] DOB: [birth year] DOA: 05/16/2016 PCP: [name], MD     Brief Narrative:   Evan Avery is an 70 y.o. male past medical history of coronary artery disease with a stent placement 92, obesity came into the hospital complaining of abdominal pain scan of the abdomen and pelvis showed 5 cm developing abscess in the pelvis likely secondary to diverticular sigmoid colon Perforation, surgery was consulted, who recommended IR to place cutaneous drain, aspirin and Plavix were held. IR was unable to play drain his abscess is quite deep, As the patient decompensated on 05/23/2012 surgery proceeded with intervention and colostomy  Assessment/Plan:   Sepsis ([place]) due to Colonic diverticular abscess due to Diverticulitis of colon with perforation: Started on empiric antibiotics Zosyn with no improvement. Surgery to rec when to start aspirin and Plavix. Patient may have ice chips. Exploratory laparotomy and drainage of multiple intra-abdominal abscess with sigmoid colectomy and a colostomy on 8.1.2017. Patient sedated change PCA pump to morphine reduced dose. PICC line inserted on 05/24/2012, on physical exam his abdomen is not tender but is significantly more distended Wound Vac placement  soon.  Diabetes mellitus type 2, uncontrolled, with complications ([place]) Blood glucose cot to be high, will increase his Levemir and cont sliding scale insulin, with CBGs every 4 hours.  CAD with stents: 4 stents in the RCA pain 2009, copnt to hold aspirin and Plavix Surgery to advise when to resume. Resume atenolol.  Cough: Hypoxic: Continue Tessalon Perles.  Essential hypertension: Resume atenolol continue to hold ACE inhibitor.  OSA on CPAP: Continue Cipro tonight.  Obesity, Class II, BMI 35-39.9, with comorbidity (HCC)   DVT prophylaxis: lovenox Family Communication:none Disposition Plan/Barrier to D/C: to  be detemine Code Status:     Code Status Orders        Start     Ordered   05/23/16 1436  Full code  Continuous     05/23/16 1435    Code Status History    Date Active Date Inactive Code Status Order ID Comments User Context   05/16/2016  4:58 PM 05/23/2016  2:35 PM Full Code [id number]  [name], MD Inpatient   10/11/2011  8:57 AM 10/13/2011  3:49 PM Full Code [id number]  [name], RN Inpatient    Advance Directive Documentation   Flowsheet Row Most Recent Value  Type of Advance Directive  Healthcare Power of Attorney, Living will  Pre-existing out of facility DNR order (yellow form or pink MOST form)  No data  "MOST" Form in Place?  No data        IV Access:    Peripheral IV   Procedures and diagnostic studies:   No results found.   Medical Consultants:    None.  Anti-Infectives:   Zosyn  Subjective:    Evan Avery he relates his pain is much better control.  Objective:    Vitals:   05/25/16 2357 05/26/16 0144 05/26/16 0356 05/26/16 0548  BP:  (!) 169/77  (!) 140/58  Pulse:  87  62  Resp: 10 20 13 20   Temp:  98.5 F (36.9 C)  98.5 F (36.9 C)  TempSrc:  Oral  Oral  SpO2: 95% 92% 91% 95%  Weight:      Height:        Intake/Output Summary (Last 24 hours) at 05/26/16 0902 Last data filed at 05/26/16 0553  Gross per 24 hour  Intake          1470.17 ml  Output             2150 ml  Net          -679.83 ml   Filed Weights   05/16/16 1626 05/16/16 1706  Weight: 113.4 kg (250 lb) 108.2 kg (238 lb 8.6 oz)    Exam: General exam: In no acute distress. Respiratory system: Good air movement and clear to auscultation. Cardiovascular system: S1 & S2 heard, RRR. Gastrointestinal system: Abdomen is Distended, colostomy in place, More distended than yesterday. Central nervous system: Alert and oriented. No focal neurological deficits. Extremities: No pedal edema. Skin: No rashes, lesions or ulcers Psychiatry: Judgement and insight  appear normal. Mood & affect appropriate.    Data Reviewed:    Labs: Basic Metabolic Panel:  Recent Labs Lab 05/20/16 0542 05/22/16 1041 05/24/16 0343 05/25/16 0426 05/26/16 0432  NA 135 138 136 137 138  K 3.5 3.6 5.0 3.7 3.3*  CL 103 105 107 104 104  CO2 24 23 22 27 28   GLUCOSE 174* 151* 236* 229* 159*  BUN 11 13 13 13 17   CREATININE 0.87 0.84 0.84 0.81 0.77  CALCIUM 8.2* 8.3* 8.2* 8.1* 8.1*  MG  --   --   --  1.8 1.9  PHOS  --   --   --  2.2* 2.5   GFR Estimated Creatinine Clearance: 102.4 mL/min (by C-G formula based on SCr of 0.8 mg/dL). Liver Function Tests:  Recent Labs Lab 05/22/16 1041 05/25/16 0426 05/26/16 0432  AST 28 23 22   ALT 22 19 16*  ALKPHOS 72 50 51  BILITOT 0.9 0.6 0.5  PROT 5.7* 4.8* 5.2*  ALBUMIN 2.3* 1.9* 1.8*   No results for input(s): LIPASE, AMYLASE in the last 168 hours. No results for input(s): AMMONIA in the last 168 hours. Coagulation profile No results for input(s): INR, PROTIME in the last 168 hours.  CBC:  Recent Labs Lab 05/20/16 0542 05/22/16 1041 05/24/16 0343 05/25/16 0426  WBC 10.1 10.7* 14.4* 16.9*  NEUTROABS  --   --   --  14.9*  HGB 10.9* 10.6* 12.5* 9.6*  HCT 32.4* 31.6* 38.3* 29.0*  MCV 93.6 94.3 95.3 95.7  PLT 275 363 409* 374   Cardiac Enzymes: No results for input(s): CKTOTAL, CKMB, CKMBINDEX, TROPONINI in the last 168 hours. BNP (last 3 results) No results for input(s): PROBNP in the last 8760 hours. CBG:  Recent Labs Lab 05/25/16 1657 05/25/16 2002 05/25/16 2329 05/26/16 0329 05/26/16 0823  GLUCAP 167* 148* 145* 148* 169*   D-Dimer: No results for input(s): DDIMER in the last 72 hours. Hgb A1c: No results for input(s): HGBA1C in the last 72 hours. Lipid Profile:  Recent Labs  05/25/16 0426  TRIG 226*   Thyroid function studies: No results for input(s): TSH, T4TOTAL, T3FREE, THYROIDAB in the last 72 hours.  Invalid input(s): FREET3 Anemia work up: No results for input(s):  VITAMINB12, FOLATE, FERRITIN, TIBC, IRON, RETICCTPCT in the last 72 hours. Sepsis Labs:  Recent Labs Lab 05/20/16 0542 05/22/16 1041 05/24/16 0343 05/25/16 0426  WBC 10.1 10.7* 14.4* 16.9*   Microbiology Recent Results (from the past 240 hour(s))  Urine culture     Status: Abnormal   Collection Time: 05/16/16 12:23 PM  Result Value Ref Range Status   Specimen Description URINE, CLEAN CATCH  Final   Special Requests NONE  Final   Culture MULTIPLE SPECIES PRESENT, SUGGEST RECOLLECTION (A)  Final   Report Status 05/17/2016 FINAL  Final  Culture, blood (Routine X 2) w Reflex to ID Panel     Status: None   Collection Time: 05/16/16  5:25 PM  Result Value Ref Range Status   Specimen Description BLOOD RIGHT ARM  Final   Special Requests BOTTLES DRAWN AEROBIC AND ANAEROBIC  10CC  Final   Culture   Final    NO GROWTH 5 DAYS Performed at [facility]    Report Status 05/21/2016 FINAL  Final  Culture, blood (Routine X 2) w Reflex to ID Panel     Status: None   Collection Time: 05/16/16  5:30 PM  Result Value Ref Range Status   Specimen Description BLOOD LEFT HAND  Final   Special Requests BOTTLES DRAWN AEROBIC AND ANAEROBIC  10CC  Final   Culture   Final    NO GROWTH 5 DAYS Performed at [facility]    Report Status 05/21/2016 FINAL  Final  Surgical pcr screen     Status: Abnormal   Collection Time: 05/23/16  7:59 AM  Result Value Ref Range Status   MRSA, PCR POSITIVE (A) NEGATIVE Final    Comment: RESULT CALLED TO, READ BACK BY AND VERIFIED WITH: [place] AT 1055 ON 08.01.17 BY SHUEA    Staphylococcus aureus POSITIVE (A) NEGATIVE Final    Comment:        The Xpert SA Assay (FDA approved for NASAL specimens in patients over 63 years of age), is one component of a comprehensive surveillance program.  Test performance has been validated by [facility] for patients greater than or equal to 36 year old. It is not intended to diagnose infection nor to guide  or monitor treatment.      Medications:   . antiseptic oral rinse  7 mL Mouth Rinse q12n4p  . atenolol  50 mg Oral Daily  . chlorhexidine  15 mL Mouth Rinse BID  . Chlorhexidine Gluconate Cloth  6 each Topical Q0600  . famotidine (PEPCID) IV  20 mg Intravenous Q12H  . heparin subcutaneous  5,000 Units Subcutaneous Q8H  . HYDROmorphone   Intravenous Q4H  . insulin aspart  0-20 Units Subcutaneous Q4H  . insulin detemir  10 Units Subcutaneous BID  . metoprolol  5 mg Intravenous Q6H  . mupirocin ointment  1 application Nasal BID  . piperacillin-tazobactam (ZOSYN)  IV  3.375 g Intravenous Q8H  . sodium chloride flush  10-40 mL Intracatheter Q12H   Continuous Infusions: . sodium chloride 50 mL/hr at 05/25/16 0845  . Marland KitchenTPN (CLINIMIX-E) Adult 40 mL/hr at 05/25/16 1751   And  . fat emulsion 240 mL (05/25/16 1751)    Time spent: 15 min   LOS: 10 days   Charlynne Cousins  [facility] Pager [phone]  *Please refer to [email or web address], password [email or web address] to get updated schedule on who will round on this patient, as hospitalists switch teams weekly. If 7PM-7AM, please contact night-coverage at [email or web address], password [email or web address] for any overnight needs.  05/26/2016, 9:02 AM

## 2016-05-26 NOTE — Consult Note (Signed)
[place] Nurse ostomy follow up Stoma type/location: LLQ Colostomy Stomal assessment/size: 2 inch oval Enrolled patient in [place] Start Discharge program: Yes, 05/26/16.  4 skin barriers, 4 opaque pouches with integrated filters and Lock and Roll Closure, 4 skin barrier rings, Large belt and [employer] powder. [place] nursing team will follow, and will remain available to this patient, the nursing, surgical and medical teams. . Thanks, Maudie Flakes, MSN, RN, [place], Arther Abbott  Pager# [phone]

## 2016-05-27 DIAGNOSIS — A419 Sepsis, unspecified organism: Secondary | ICD-10-CM | POA: Diagnosis not present

## 2016-05-27 DIAGNOSIS — G4733 Obstructive sleep apnea (adult) (pediatric): Secondary | ICD-10-CM | POA: Diagnosis not present

## 2016-05-27 DIAGNOSIS — I10 Essential (primary) hypertension: Secondary | ICD-10-CM | POA: Diagnosis not present

## 2016-05-27 DIAGNOSIS — K913 Postprocedural intestinal obstruction: Secondary | ICD-10-CM | POA: Diagnosis not present

## 2016-05-27 DIAGNOSIS — E118 Type 2 diabetes mellitus with unspecified complications: Secondary | ICD-10-CM | POA: Diagnosis not present

## 2016-05-27 DIAGNOSIS — K5720 Diverticulitis of large intestine with perforation and abscess without bleeding: Secondary | ICD-10-CM | POA: Diagnosis not present

## 2016-05-27 LAB — BASIC METABOLIC PANEL
Anion gap: 5 (ref 5–15)
BUN: 17 mg/dL (ref 6–20)
CHLORIDE: 105 mmol/L (ref 101–111)
CO2: 28 mmol/L (ref 22–32)
Calcium: 8.2 mg/dL — ABNORMAL LOW (ref 8.9–10.3)
Creatinine, Ser: 0.68 mg/dL (ref 0.61–1.24)
GFR calc Af Amer: >60 mL/min (ref >60)
GFR calc non Af Amer: >60 mL/min (ref >60)
GLUCOSE: 128 mg/dL — AB (ref 65–99)
POTASSIUM: 3.5 mmol/L (ref 3.5–5.1)
Sodium: 138 mmol/L (ref 135–145)

## 2016-05-27 LAB — GLUCOSE, CAPILLARY
GLUCOSE-CAPILLARY: 143 mg/dL — AB (ref 65–99)
GLUCOSE-CAPILLARY: 145 mg/dL — AB (ref 65–99)
GLUCOSE-CAPILLARY: 146 mg/dL — AB (ref 65–99)
GLUCOSE-CAPILLARY: 151 mg/dL — AB (ref 65–99)
Glucose-Capillary: 125 mg/dL — ABNORMAL HIGH (ref 65–99)
Glucose-Capillary: 137 mg/dL — ABNORMAL HIGH (ref 65–99)

## 2016-05-27 LAB — PHOSPHORUS: PHOSPHORUS: 3.5 mg/dL (ref 2.5–4.6)

## 2016-05-27 LAB — MAGNESIUM: Magnesium: 1.9 mg/dL (ref 1.7–2.4)

## 2016-05-27 MED ORDER — FAT EMULSION 20 % IV EMUL
240.0000 mL | INTRAVENOUS | Status: AC
Start: 2016-05-27 — End: 2016-05-28
  Administered 2016-05-27: 240 mL via INTRAVENOUS
  Filled 2016-05-27: qty 250

## 2016-05-27 MED ORDER — INSULIN DETEMIR 100 UNIT/ML ~~LOC~~ SOLN
12.0000 [IU] | Freq: Two times a day (BID) | SUBCUTANEOUS | Status: DC
  Administered 2016-05-27 – 2016-05-28 (×2): 12 [IU] via SUBCUTANEOUS
  Filled 2016-05-27 (×3): qty 0.12

## 2016-05-27 MED ORDER — TRACE MINERALS CR-CU-MN-SE-ZN 10-1000-500-60 MCG/ML IV SOLN
INTRAVENOUS | Status: AC
  Administered 2016-05-27: 18:00:00 via INTRAVENOUS
  Filled 2016-05-27: qty 1800

## 2016-05-27 MED ORDER — SODIUM CHLORIDE 0.9 % IV SOLN
INTRAVENOUS | Status: DC
  Administered 2016-05-27: 18:00:00 via INTRAVENOUS

## 2016-05-27 NOTE — Progress Notes (Signed)
PARENTERAL NUTRITION CONSULT NOTE - Follow Up  Pharmacy Consult for TPN Indication: intolerance to enteral feedings  Allergies  Allergen Reactions  . Niacin And Related Other (See Comments)    Extreme fatigue    Patient Measurements: Height: 5\' 8"  (172.7 cm) Weight: 238 lb 8.6 oz (108.2 kg) IBW/kg (Calculated) : 68.4 Adjusted Body Weight: 100 kg Usual Weight: 113 kg on admit 05/16/16  Vital Signs: Temp: 98.3 F (36.8 C) (08/05 0609) Temp Source: Oral (08/05 0609) BP: 145/62 (08/05 0609) Pulse Rate: 58 (08/05 0609) Intake/Output from previous day: 08/04 0701 - 08/05 0700 In: 306 [I.V.:306] Out: 1215 [Urine:1200; Drains:15] Intake/Output from this shift: No intake/output data recorded.  Labs:  Recent Labs  05/25/16 0426  WBC 16.9*  HGB 9.6*  HCT 29.0*  PLT 374     Recent Labs  05/25/16 0426 05/26/16 0432 05/27/16 0500  NA 137 138 138  K 3.7 3.3* 3.5  CL 104 104 105  CO2 27 28 28   GLUCOSE 229* 159* 128*  BUN 13 17 17   CREATININE 0.81 0.77 0.68  CALCIUM 8.1* 8.1* 8.2*  MG 1.8 1.9 1.9  PHOS 2.2* 2.5 3.5  PROT 4.8* 5.2*  --   ALBUMIN 1.9* 1.8*  --   AST 23 22  --   ALT 19 16*  --   ALKPHOS 50 51  --   BILITOT 0.6 0.5  --   PREALBUMIN 5.1*  --   --   TRIG 226*  --   --    Estimated Creatinine Clearance: 102.4 mL/min (by C-G formula based on SCr of 0.8 mg/dL).    Recent Labs  05/26/16 2331 05/27/16 0408 05/27/16 0741  GLUCAP 162* 125* 146*    Medical History: Past Medical History:  Diagnosis Date  . CAD (coronary artery disease)    4 stents in right artery  . Complex tear of medial meniscus of left knee as current injury 07/26/2012   [place]  . Diabetes mellitus   . Glaucoma 2006   on drops, followed by [place]  . Hyperlipidemia   . Hypertension   . Myocardial infarction ([place])    1991  . Sleep apnea    uses CPAP regularly    Medications:  Scheduled:  . antiseptic oral rinse  7 mL Mouth Rinse q12n4p  . atenolol  50 mg Oral Daily  .  chlorhexidine  15 mL Mouth Rinse BID  . Chlorhexidine Gluconate Cloth  6 each Topical Q0600  . famotidine (PEPCID) IV  20 mg Intravenous Q12H  . heparin subcutaneous  5,000 Units Subcutaneous Q8H  . HYDROmorphone   Intravenous Q4H  . insulin aspart  0-20 Units Subcutaneous Q4H  . insulin detemir  10 Units Subcutaneous BID  . mupirocin ointment  1 application Nasal BID  . piperacillin-tazobactam (ZOSYN)  IV  3.375 g Intravenous Q8H   Infusions:  . Marland KitchenTPN (CLINIMIX-E) Adult 60 mL/hr at 05/26/16 1724   And  . fat emulsion 240 mL (05/26/16 1725)  . sodium chloride 30 mL/hr (05/26/16 1948)   PRN: diphenhydrAMINE **OR** diphenhydrAMINE, metoprolol, naloxone **AND** sodium chloride flush, ondansetron (ZOFRAN) IV, phenol, zolpidem  Insulin Requirements: 21 units Novolog SSI last 24hrs (improved) + 12 units in TPN (increased yesterday) + Levemir 10 mg BID  Current Nutrition: NPO  IVF: NS at 30 ml/hr  Central access: PICC placed 8/2 TPN start date:  8/2  ASSESSMENT  HPI: 70 y/o with h/o CAD and 4 stents, OSA on CPAP, DM, HTN, HLD who presents for abdominal pain & constipation. Found to have an intra-abdominal abscess on CT scan. IR unable to place drain as abscess is quite deep.  Initally, plan was to continue IV antibiotics in hopes to avoid surgery. Repeat CT on 7/31 showed many new small abscessed, prior abscess improving.  Significant events:  8/1: Expl lap, drainage of abscesses, sigmoid colectomy and colostomy  8/2: Abdomen-slight firm and distended, colostomy edematous and viable with no output. Place PICC and begin TPN per Pharmacy. 8/3: switch to Clinimix with lytes  Today:   Glucose: at goal range 125-169 - Levemir started 8/2 (PTA amaryl & metformin on hold while NPO) - MD titrating  Electrolytes: WNL  Renal: SCr WNL  LFTs: WNL  TGs: mildly elevated  Prealbumin: low  but improved  NUTRITIONAL GOALS                                                                                             RD recs: 1900-2100 kcal, 110-120g protein per day Clinimix 5/15 at a goal rate of 32ml/hr + 20% fat emulsion at 44ml/hr to provide: 108g/day protein, 2013Kcal/day.  PLAN                                                                                                                         At 1800 today:  Advance Clinimix E 5/15 to 75 ml/hr; will proceed slowly as CBGs much better but still not at goal  20% fat emulsion at 17ml/hr.  Plan to advance as tolerated to the goal rate.  TPN to contain standard multivitamins and trace elements.  Reduce IVF to [facility]  Continue insulin in TPN at 12 units/day  Continue SSI q4h resistant scale; Levemir 10 units BID per MD  BMP, Mg, Phos tomorrow.  TPN lab panels on Mondays & Thursdays.  F/u daily.    Adrian Saran, PharmD, BCPS Pager [phone] 05/27/2016 8:16 AM

## 2016-05-27 NOTE — Progress Notes (Signed)
[facility] PROGRESS NOTE    Progress Note  Evan Avery  [id number] DOB: 27-Jun-1946 DOA: 05/16/2016 PCP: [name], MD     Brief Narrative:   Evan Avery is an 70 y.o. male past medical history of coronary artery disease with a stent placement 92, obesity came into the hospital complaining of abdominal pain scan of the abdomen and pelvis showed 5 cm developing abscess in the pelvis likely secondary to diverticular sigmoid colon Perforation, surgery was consulted, who recommended IR to place cutaneous drain, aspirin and Plavix were held. IR was unable to play drain his abscess is quite deep, As the patient decompensated on 05/23/2012 surgery proceeded with intervention and colostomy  Assessment/Plan:   Sepsis ([place]) due to Colonic diverticular abscess due to Diverticulitis of colon with perforation: Cont IV antibiotics Zosyn. Surgery to rec when to start aspirin and Plavix. Exploratory laparotomy and drainage of multiple intra-abdominal abscess with sigmoid colectomy and a colostomy on 8.1.2017. Cont PCA pump. PICC line inserted on 05/24/2012, on physical exam his abdomen is not tender but is significantly more distended Persistent ileus.  Diabetes mellitus type 2, uncontrolled, with complications ([name]) Blood glucose cont to be high, will increase his Levemir and cont sliding scale insulin, with CBGs every 4 hours.  CAD with stents: 4 stents in the RCA pain 2009, copnt to hold aspirin and Plavix Surgery to advise when to resume. Resume atenolol.  Cough: Hypoxic: Continue Tessalon Perles.  Essential hypertension: Resume atenolol continue to hold ACE inhibitor.  OSA on CPAP: Continue Cipro tonight.  Obesity, Class II, BMI 35-39.9, with comorbidity (HCC)   DVT prophylaxis: lovenox Family Communication:none Disposition Plan/Barrier to D/C: to be detemine Code Status:     Code Status Orders        Start     Ordered   05/23/16 1436  Full code   Continuous     05/23/16 1435    Code Status History    Date Active Date Inactive Code Status Order ID Comments User Context   05/16/2016  4:58 PM 05/23/2016  2:35 PM Full Code [id number]  [name], MD Inpatient   10/11/2011  8:57 AM 10/13/2011  3:49 PM Full Code [id number]  [name], RN Inpatient    Advance Directive Documentation   Flowsheet Row Most Recent Value  Type of Advance Directive  Healthcare Power of Attorney, Living will  Pre-existing out of facility DNR order (yellow form or pink MOST form)  No data  "MOST" Form in Place?  No data        IV Access:    Peripheral IV   Procedures and diagnostic studies:   No results found.   Medical Consultants:    None.  Anti-Infectives:   Zosyn  Subjective:    Evan Avery he relates his pain is much better control.  Objective:    Vitals:   05/27/16 0400 05/27/16 0609 05/27/16 0758 05/27/16 0915  BP:  (!) 145/62  (!) 152/62  Pulse:  (!) 58  61  Resp: 16 14 16 16   Temp:  98.3 F (36.8 C)  97.9 F (36.6 C)  TempSrc:  Oral  Oral  SpO2: 98% 98% 95% 99%  Weight:      Height:        Intake/Output Summary (Last 24 hours) at 05/27/16 0956 Last data filed at 05/27/16 0610  Gross per 24 hour  Intake              306 ml  Output             1015 ml  Net             -709 ml   Filed Weights   05/16/16 1626 05/16/16 1706  Weight: 113.4 kg (250 lb) 108.2 kg (238 lb 8.6 oz)    Exam: General exam: In no acute distress. Respiratory system: Good air movement and clear to auscultation. Cardiovascular system: S1 & S2 heard, RRR. Gastrointestinal system: Abdomen is Distended, colostomy in place, More distended than yesterday. Central nervous system: Alert and oriented. No focal neurological deficits. Extremities: No pedal edema. Skin: No rashes, lesions or ulcers Psychiatry: Judgement and insight appear normal. Mood & affect appropriate.    Data Reviewed:    Labs: Basic Metabolic Panel:  Recent  Labs Lab 05/22/16 1041 05/24/16 0343 05/25/16 0426 05/26/16 0432 05/27/16 0500  NA 138 136 137 138 138  K 3.6 5.0 3.7 3.3* 3.5  CL 105 107 104 104 105  CO2 23 22 27 28 28   GLUCOSE 151* 236* 229* 159* 128*  BUN 13 13 13 17 17   CREATININE 0.84 0.84 0.81 0.77 0.68  CALCIUM 8.3* 8.2* 8.1* 8.1* 8.2*  MG  --   --  1.8 1.9 1.9  PHOS  --   --  2.2* 2.5 3.5   GFR Estimated Creatinine Clearance: 102.4 mL/min (by C-G formula based on SCr of 0.8 mg/dL). Liver Function Tests:  Recent Labs Lab 05/22/16 1041 05/25/16 0426 05/26/16 0432  AST 28 23 22   ALT 22 19 16*  ALKPHOS 72 50 51  BILITOT 0.9 0.6 0.5  PROT 5.7* 4.8* 5.2*  ALBUMIN 2.3* 1.9* 1.8*   No results for input(s): LIPASE, AMYLASE in the last 168 hours. No results for input(s): AMMONIA in the last 168 hours. Coagulation profile No results for input(s): INR, PROTIME in the last 168 hours.  CBC:  Recent Labs Lab 05/22/16 1041 05/24/16 0343 05/25/16 0426  WBC 10.7* 14.4* 16.9*  NEUTROABS  --   --  14.9*  HGB 10.6* 12.5* 9.6*  HCT 31.6* 38.3* 29.0*  MCV 94.3 95.3 95.7  PLT 363 409* 374   Cardiac Enzymes: No results for input(s): CKTOTAL, CKMB, CKMBINDEX, TROPONINI in the last 168 hours. BNP (last 3 results) No results for input(s): PROBNP in the last 8760 hours. CBG:  Recent Labs Lab 05/26/16 1831 05/26/16 2004 05/26/16 2331 05/27/16 0408 05/27/16 0741  GLUCAP 161* 157* 162* 125* 146*   D-Dimer: No results for input(s): DDIMER in the last 72 hours. Hgb A1c: No results for input(s): HGBA1C in the last 72 hours. Lipid Profile:  Recent Labs  05/25/16 0426  TRIG 226*   Thyroid function studies: No results for input(s): TSH, T4TOTAL, T3FREE, THYROIDAB in the last 72 hours.  Invalid input(s): FREET3 Anemia work up: No results for input(s): VITAMINB12, FOLATE, FERRITIN, TIBC, IRON, RETICCTPCT in the last 72 hours. Sepsis Labs:  Recent Labs Lab 05/22/16 1041 05/24/16 0343 05/25/16 0426  WBC  10.7* 14.4* 16.9*   Microbiology Recent Results (from the past 240 hour(s))  Surgical pcr screen     Status: Abnormal   Collection Time: 05/23/16  7:59 AM  Result Value Ref Range Status   MRSA, PCR POSITIVE (A) NEGATIVE Final    Comment: RESULT CALLED TO, READ BACK BY AND VERIFIED WITH: T COBLE RN AT 1055 ON 08.01.17 BY SHUEA    Staphylococcus aureus POSITIVE (A) NEGATIVE Final    Comment:        The  Xpert SA Assay (FDA approved for NASAL specimens in patients over 86 years of age), is one component of a comprehensive surveillance program.  Test performance has been validated by [facility] for patients greater than or equal to 108 year old. It is not intended to diagnose infection nor to guide or monitor treatment.      Medications:   . antiseptic oral rinse  7 mL Mouth Rinse q12n4p  . atenolol  50 mg Oral Daily  . chlorhexidine  15 mL Mouth Rinse BID  . Chlorhexidine Gluconate Cloth  6 each Topical Q0600  . famotidine (PEPCID) IV  20 mg Intravenous Q12H  . heparin subcutaneous  5,000 Units Subcutaneous Q8H  . HYDROmorphone   Intravenous Q4H  . insulin aspart  0-20 Units Subcutaneous Q4H  . insulin detemir  10 Units Subcutaneous BID  . mupirocin ointment  1 application Nasal BID  . piperacillin-tazobactam (ZOSYN)  IV  3.375 g Intravenous Q8H   Continuous Infusions: . Marland KitchenTPN (CLINIMIX-E) Adult 60 mL/hr at 05/26/16 1724   And  . fat emulsion 240 mL (05/26/16 1725)  . Marland KitchenTPN (CLINIMIX-E) Adult     And  . fat emulsion    . sodium chloride      Time spent: 15 min   LOS: 11 days   Charlynne Cousins  [facility] Pager [phone]  *Please refer to [email or web address], password [email or web address] to get updated schedule on who will round on this patient, as hospitalists switch teams weekly. If 7PM-7AM, please contact night-coverage at [email or web address], password [email or web address] for any overnight needs.  05/27/2016, 9:56 AM

## 2016-05-27 NOTE — Progress Notes (Signed)
Pt has refused CPAP for the night due to EtCO2 monitoring with PCA.  RT to monitor and assess as needed.

## 2016-05-27 NOTE — Progress Notes (Signed)
Patient ID: Evan Avery, male   DOB: [birth year], 70 y.o.   MRN: [id number] 4 Days Post-Op  Subjective: Feels he is doing much better. Pain is improved. Denies nausea. States he has had ostomy output but I do not see anything recorded.  Objective: Vital signs in last 24 hours: Temp:  [97.7 F (36.5 C)-98.4 F (36.9 C)] 98.3 F (36.8 C) (08/05 0609) Pulse Rate:  [54-66] 58 (08/05 0609) Resp:  [12-17] 16 (08/05 0758) BP: (142-148)/(54-71) 145/62 (08/05 0609) SpO2:  [94 %-99 %] 95 % (08/05 0758) Last BM Date: 05/23/16  Intake/Output from previous day: 08/04 0701 - 08/05 0700 In: 306 [I.V.:306] Out: 1215 [Urine:1200; Drains:15] Intake/Output this shift: No intake/output data recorded.  General appearance: alert, cooperative and no distress Resp: no wheezing or increased work  breathing GI: obese. Seems somewhat distended. Nontender. Ostomy is left lower quadrant  with small amount of fluid in the bag. Incision/Wound: VAC dressing in place without erythema or unusual drainage  Lab Results:   Recent Labs  05/25/16 0426  WBC 16.9*  HGB 9.6*  HCT 29.0*  PLT 374   BMET  Recent Labs  05/26/16 0432 05/27/16 0500  NA 138 138  K 3.3* 3.5  CL 104 105  CO2 28 28  GLUCOSE 159* 128*  BUN 17 17  CREATININE 0.77 0.68  CALCIUM 8.1* 8.2*     Studies/Results: No results found.  Anti-infectives: Anti-infectives    Start     Dose/Rate Route Frequency Ordered Stop   05/23/16 1530  piperacillin-tazobactam (ZOSYN) IVPB 3.375 g     3.375 g 12.5 mL/hr over 240 Minutes Intravenous Every 8 hours 05/23/16 1435     05/16/16 2200  piperacillin-tazobactam (ZOSYN) IVPB 3.375 g  Status:  Discontinued     3.375 g 12.5 mL/hr over 240 Minutes Intravenous Every 8 hours 05/16/16 1710 05/23/16 2152   05/16/16 1500  piperacillin-tazobactam (ZOSYN) IVPB 3.375 g     3.375 g 100 mL/hr over 30 Minutes Intravenous  Once 05/16/16 1435 05/16/16 1613   05/16/16 1430  piperacillin-tazobactam  (ZOSYN) IVPB 4.5 g  Status:  Discontinued     4.5 g 200 mL/hr over 30 Minutes Intravenous  Once 05/16/16 1357 05/16/16 1435      Assessment/Plan: s/p Procedure(s): EXPLORATORY LAPAROTOMY SMALL BOWEL RESECTION DRAINAGE OF MULTIPLE INTRA  ABDOMINAL ABSCESS COLOSTOMY Stable postoperatively. Still appears to have some ileus.On TNA. Continue IV antibiotics and nothing by mouth for now. Check CBC in a.m.   LOS: 11 days    Drezden Seitzinger T 05/27/2016

## 2016-05-28 DIAGNOSIS — K5720 Diverticulitis of large intestine with perforation and abscess without bleeding: Secondary | ICD-10-CM | POA: Diagnosis not present

## 2016-05-28 DIAGNOSIS — I10 Essential (primary) hypertension: Secondary | ICD-10-CM | POA: Diagnosis not present

## 2016-05-28 DIAGNOSIS — K913 Postprocedural intestinal obstruction: Secondary | ICD-10-CM | POA: Diagnosis not present

## 2016-05-28 DIAGNOSIS — G4733 Obstructive sleep apnea (adult) (pediatric): Secondary | ICD-10-CM | POA: Diagnosis not present

## 2016-05-28 DIAGNOSIS — A419 Sepsis, unspecified organism: Secondary | ICD-10-CM | POA: Diagnosis not present

## 2016-05-28 DIAGNOSIS — E118 Type 2 diabetes mellitus with unspecified complications: Secondary | ICD-10-CM | POA: Diagnosis not present

## 2016-05-28 LAB — GLUCOSE, CAPILLARY
GLUCOSE-CAPILLARY: 177 mg/dL — AB (ref 65–99)
GLUCOSE-CAPILLARY: 195 mg/dL — AB (ref 65–99)
GLUCOSE-CAPILLARY: 178 mg/dL — AB (ref 65–99)
GLUCOSE-CAPILLARY: 145 mg/dL — AB (ref 65–99)
GLUCOSE-CAPILLARY: 177 mg/dL — AB (ref 65–99)
Glucose-Capillary: 181 mg/dL — ABNORMAL HIGH (ref 65–99)

## 2016-05-28 LAB — CBC
HEMATOCRIT: 27.2 % — AB (ref 39.0–52.0)
Hemoglobin: 8.5 g/dL — ABNORMAL LOW (ref 13.0–17.0)
MCH: 29.9 pg (ref 26.0–34.0)
MCHC: 31.3 g/dL (ref 30.0–36.0)
MCV: 95.8 fL (ref 78.0–100.0)
PLATELETS: 339 10*3/uL (ref 150–400)
RBC: 2.84 MIL/uL — AB (ref 4.22–5.81)
RDW: 13.9 % (ref 11.5–15.5)
WBC: 8.1 10*3/uL (ref 4.0–10.5)

## 2016-05-28 LAB — MAGNESIUM: Magnesium: 2 mg/dL (ref 1.7–2.4)

## 2016-05-28 LAB — BASIC METABOLIC PANEL
Anion gap: 7 (ref 5–15)
BUN: 17 mg/dL (ref 6–20)
CO2: 29 mmol/L (ref 22–32)
Calcium: 8.3 mg/dL — ABNORMAL LOW (ref 8.9–10.3)
Chloride: 102 mmol/L (ref 101–111)
Creatinine, Ser: 0.71 mg/dL (ref 0.61–1.24)
GFR calc Af Amer: >60 mL/min (ref >60)
GLUCOSE: 169 mg/dL — AB (ref 65–99)
POTASSIUM: 3.9 mmol/L (ref 3.5–5.1)
Sodium: 138 mmol/L (ref 135–145)

## 2016-05-28 LAB — PHOSPHORUS: Phosphorus: 3.5 mg/dL (ref 2.5–4.6)

## 2016-05-28 MED ORDER — INSULIN DETEMIR 100 UNIT/ML ~~LOC~~ SOLN
15.0000 [IU] | Freq: Two times a day (BID) | SUBCUTANEOUS | Status: DC
  Administered 2016-05-28 – 2016-05-31 (×6): 15 [IU] via SUBCUTANEOUS
  Filled 2016-05-28 (×7): qty 0.15

## 2016-05-28 MED ORDER — FAT EMULSION 20 % IV EMUL
240.0000 mL | INTRAVENOUS | Status: AC
  Administered 2016-05-28: 240 mL via INTRAVENOUS
  Filled 2016-05-28: qty 250

## 2016-05-28 MED ORDER — SODIUM CHLORIDE 0.9% FLUSH
10.0000 mL | INTRAVENOUS | Status: DC | PRN
  Administered 2016-05-28 – 2016-05-30 (×3): 10 mL
  Administered 2016-06-01: 20 mL
  Filled 2016-05-28 (×3): qty 40

## 2016-05-28 MED ORDER — M.V.I. ADULT IV INJ
INJECTION | INTRAVENOUS | Status: AC
  Administered 2016-05-28: 18:00:00 via INTRAVENOUS
  Filled 2016-05-28: qty 2000

## 2016-05-28 NOTE — Progress Notes (Signed)
Pt refused to wear his home cpap.  Pt states that he will wear it once off [place]/etco2 monitoring with PCA.  Pt was advised that RT is available all night should he need further assistance.

## 2016-05-28 NOTE — Progress Notes (Addendum)
Patient ID: Evan Avery, male   DOB: 09-03-1946, 70 y.o.   MRN: [id number] 5 Days Post-Op  Subjective: Occasional nausea without vomiting. Some incisional pain but improving.  He got out of bed and walked yesterday. Still very minimal watery colostomy output.  Objective: Vital signs in last 24 hours: Temp:  [97.9 F (36.6 C)-98.6 F (37 C)] 98.1 F (36.7 C) (08/06 0517) Pulse Rate:  [48-63] 63 (08/06 0517) Resp:  [11-17] 15 (08/06 0517) BP: (146-155)/(62-67) 155/67 (08/06 0517) SpO2:  [93 %-99 %] 98 % (08/06 0517) Last BM Date: 05/23/16  Intake/Output from previous day: 08/05 0701 - 08/06 0700 In: 1190 [I.V.:1140; IV Piggyback:50] Out: 430 [Urine:350; Drains:20; Stool:60] Intake/Output this shift: No intake/output data recorded.  General appearance: alert, cooperative and no distress GI: distended. Very mild diffuse tenderness.  JP drainage is serosanguineous Incision/Wound: VAC in place without erythema or unusual drainage  Lab Results:   Recent Labs  05/28/16 0416  WBC 8.1  HGB 8.5*  HCT 27.2*  PLT 339   BMET  Recent Labs  05/27/16 0500 05/28/16 0416  NA 138 138  K 3.5 3.9  CL 105 102  CO2 28 29  GLUCOSE 128* 169*  BUN 17 17  CREATININE 0.68 0.71  CALCIUM 8.2* 8.3*     Studies/Results: No results found.  Anti-infectives: Anti-infectives    Start     Dose/Rate Route Frequency Ordered Stop   05/23/16 1530  piperacillin-tazobactam (ZOSYN) IVPB 3.375 g     3.375 g 12.5 mL/hr over 240 Minutes Intravenous Every 8 hours 05/23/16 1435     05/16/16 2200  piperacillin-tazobactam (ZOSYN) IVPB 3.375 g  Status:  Discontinued     3.375 g 12.5 mL/hr over 240 Minutes Intravenous Every 8 hours 05/16/16 1710 05/23/16 2152   05/16/16 1500  piperacillin-tazobactam (ZOSYN) IVPB 3.375 g     3.375 g 100 mL/hr over 30 Minutes Intravenous  Once 05/16/16 1435 05/16/16 1613   05/16/16 1430  piperacillin-tazobactam (ZOSYN) IVPB 4.5 g  Status:  Discontinued     4.5  g 200 mL/hr over 30 Minutes Intravenous  Once 05/16/16 1357 05/16/16 1435      Assessment/Plan: s/p Procedure(s): EXPLORATORY LAPAROTOMY SMALL BOWEL RESECTION DRAINAGE OF MULTIPLE INTRA  ABDOMINAL ABSCESS COLOSTOMY Overall improved without evidence of infection but continued ileus. On TNA.Continue nothing by mouth for now. Activity encouraged. Discontinue Foley   LOS: 12 days    Dyke Weible T 05/28/2016

## 2016-05-28 NOTE — Progress Notes (Signed)
[facility] PROGRESS NOTE    Progress Note  Evan Avery  [id number] DOB: 1946/05/15 DOA: 05/16/2016 PCP: [name], MD     Brief Narrative:   Evan Avery is an 70 y.o. male past medical history of coronary artery disease with a stent placement 92, obesity came into the hospital complaining of abdominal pain scan of the abdomen and pelvis showed 5 cm developing abscess in the pelvis likely secondary to diverticular sigmoid colon Perforation, surgery was consulted, who recommended IR to place cutaneous drain, aspirin and Plavix were held. IR was unable to play drain his abscess is quite deep, As the patient decompensated on 05/23/2012 surgery proceeded with intervention and colostomy, Started on TNA and antibiotics were continued. Wound VAC to be placed soon.  Assessment/Plan:   Sepsis ([place]) due to Colonic diverticular abscess due to Diverticulitis of colon with perforation: Cont IV antibiotics Zosyn. Surgery to rec when to start aspirin and Plavix. Exploratory laparotomy and drainage of multiple intra-abdominal abscess with sigmoid colectomy and a colostomy on 8.1.2017. Cont PCA pump. PICC line inserted on 05/24/2012, on physical exam his abdomen is not tender but is significantly more distended Persistent ileus. Tolerating ambulation continue patient on nothing by mouth. Further management per surgery.  Diabetes mellitus type 2, uncontrolled, with complications ([place]) Blood glucose is trending up, will increase his Levemir and cont sliding scale insulin, with CBGs every 4 hours.  CAD with stents: 4 stents in the RCA pain 2009, copnt to hold aspirin and Plavix Surgery to advise when to resume. Resume atenolol.  Cough: Hypoxic: Continue Tessalon Perles.  Essential hypertension: Resume atenolol continue to hold ACE inhibitor.  OSA on CPAP: Continue Cipro tonight.  Obesity, Class II, BMI 35-39.9, with comorbidity (HCC)   DVT prophylaxis:  lovenox Family Communication:none Disposition Plan/Barrier to D/C: to be detemine Code Status:     Code Status Orders        Start     Ordered   05/23/16 1436  Full code  Continuous     05/23/16 1435    Code Status History    Date Active Date Inactive Code Status Order ID Comments User Context   05/16/2016  4:58 PM 05/23/2016  2:35 PM Full Code [id number]  [name], MD Inpatient   10/11/2011  8:57 AM 10/13/2011  3:49 PM Full Code [id number]  [name], RN Inpatient    Advance Directive Documentation   Flowsheet Row Most Recent Value  Type of Advance Directive  Healthcare Power of Attorney, Living will  Pre-existing out of facility DNR order (yellow form or pink MOST form)  No data  "MOST" Form in Place?  No data        IV Access:    Peripheral IV   Procedures and diagnostic studies:   No results found.   Medical Consultants:    None.  Anti-Infectives:   Zosyn  Subjective:    Evan Avery no complaints, he tolerated ambulation.  Objective:    Vitals:   05/28/16 0125 05/28/16 0517 05/28/16 0800 05/28/16 1008  BP: (!) 146/65 (!) 155/67    Pulse: (!) 57 63  (!) 55  Resp: 11 15 14    Temp: 98.6 F (37 C) 98.1 F (36.7 C)    TempSrc: Oral Oral    SpO2: 97% 98%    Weight:      Height:        Intake/Output Summary (Last 24 hours) at 05/28/16 1034 Last data filed at 05/28/16 0600  Gross per 24 hour  Intake             1190 ml  Output              430 ml  Net              760 ml   Filed Weights   05/16/16 1626 05/16/16 1706  Weight: 113.4 kg (250 lb) 108.2 kg (238 lb 8.6 oz)    Exam: General exam: In no acute distress. Respiratory system: Good air movement and clear to auscultation. Cardiovascular system: S1 & S2 heard, RRR. Gastrointestinal system: Abdomen is Distended, colostomy in place, More distended than yesterday. Central nervous system: Alert and oriented. No focal neurological deficits. Extremities: No pedal edema. Skin:  No rashes, lesions or ulcers Psychiatry: Judgement and insight appear normal. Mood & affect appropriate.    Data Reviewed:    Labs: Basic Metabolic Panel:  Recent Labs Lab 05/24/16 0343 05/25/16 0426 05/26/16 0432 05/27/16 0500 05/28/16 0416  NA 136 137 138 138 138  K 5.0 3.7 3.3* 3.5 3.9  CL 107 104 104 105 102  CO2 22 27 28 28 29   GLUCOSE 236* 229* 159* 128* 169*  BUN 13 13 17 17 17   CREATININE 0.84 0.81 0.77 0.68 0.71  CALCIUM 8.2* 8.1* 8.1* 8.2* 8.3*  MG  --  1.8 1.9 1.9 2.0  PHOS  --  2.2* 2.5 3.5 3.5   GFR Estimated Creatinine Clearance: 102.4 mL/min (by C-G formula based on SCr of 0.8 mg/dL). Liver Function Tests:  Recent Labs Lab 05/22/16 1041 05/25/16 0426 05/26/16 0432  AST 28 23 22   ALT 22 19 16*  ALKPHOS 72 50 51  BILITOT 0.9 0.6 0.5  PROT 5.7* 4.8* 5.2*  ALBUMIN 2.3* 1.9* 1.8*   No results for input(s): LIPASE, AMYLASE in the last 168 hours. No results for input(s): AMMONIA in the last 168 hours. Coagulation profile No results for input(s): INR, PROTIME in the last 168 hours.  CBC:  Recent Labs Lab 05/22/16 1041 05/24/16 0343 05/25/16 0426 05/28/16 0416  WBC 10.7* 14.4* 16.9* 8.1  NEUTROABS  --   --  14.9*  --   HGB 10.6* 12.5* 9.6* 8.5*  HCT 31.6* 38.3* 29.0* 27.2*  MCV 94.3 95.3 95.7 95.8  PLT 363 409* 374 339   Cardiac Enzymes: No results for input(s): CKTOTAL, CKMB, CKMBINDEX, TROPONINI in the last 168 hours. BNP (last 3 results) No results for input(s): PROBNP in the last 8760 hours. CBG:  Recent Labs Lab 05/27/16 1618 05/27/16 2002 05/27/16 2349 05/28/16 0402 05/28/16 0825  GLUCAP 137* 151* 145* 177* 195*   D-Dimer: No results for input(s): DDIMER in the last 72 hours. Hgb A1c: No results for input(s): HGBA1C in the last 72 hours. Lipid Profile: No results for input(s): CHOL, HDL, LDLCALC, TRIG, CHOLHDL, LDLDIRECT in the last 72 hours. Thyroid function studies: No results for input(s): TSH, T4TOTAL, T3FREE,  THYROIDAB in the last 72 hours.  Invalid input(s): FREET3 Anemia work up: No results for input(s): VITAMINB12, FOLATE, FERRITIN, TIBC, IRON, RETICCTPCT in the last 72 hours. Sepsis Labs:  Recent Labs Lab 05/22/16 1041 05/24/16 0343 05/25/16 0426 05/28/16 0416  WBC 10.7* 14.4* 16.9* 8.1   Microbiology Recent Results (from the past 240 hour(s))  Surgical pcr screen     Status: Abnormal   Collection Time: 05/23/16  7:59 AM  Result Value Ref Range Status   MRSA, PCR POSITIVE (A) NEGATIVE Final    Comment: RESULT CALLED TO,  READ BACK BY AND VERIFIED WITH: T COBLE RN AT 1055 ON 08.01.17 BY SHUEA    Staphylococcus aureus POSITIVE (A) NEGATIVE Final    Comment:        The Xpert SA Assay (FDA approved for NASAL specimens in patients over 43 years of age), is one component of a comprehensive surveillance program.  Test performance has been validated by [facility] for patients greater than or equal to 50 year old. It is not intended to diagnose infection nor to guide or monitor treatment.      Medications:   . antiseptic oral rinse  7 mL Mouth Rinse q12n4p  . atenolol  50 mg Oral Daily  . chlorhexidine  15 mL Mouth Rinse BID  . Chlorhexidine Gluconate Cloth  6 each Topical Q0600  . famotidine (PEPCID) IV  20 mg Intravenous Q12H  . heparin subcutaneous  5,000 Units Subcutaneous Q8H  . HYDROmorphone   Intravenous Q4H  . insulin aspart  0-20 Units Subcutaneous Q4H  . insulin detemir  12 Units Subcutaneous BID  . mupirocin ointment  1 application Nasal BID  . piperacillin-tazobactam (ZOSYN)  IV  3.375 g Intravenous Q8H   Continuous Infusions: . Marland KitchenTPN (CLINIMIX-E) Adult 75 mL/hr at 05/27/16 1745   And  . fat emulsion 240 mL (05/27/16 1746)  . Marland KitchenTPN (CLINIMIX-E) Adult     And  . fat emulsion    . sodium chloride 10 mL/hr at 05/27/16 1800    Time spent: 15 min   LOS: 12 days   Charlynne Cousins  [facility] Pager [phone]  *Please refer to [email or web address],  password [email or web address] to get updated schedule on who will round on this patient, as hospitalists switch teams weekly. If 7PM-7AM, please contact night-coverage at [email or web address], password [email or web address] for any overnight needs.  05/28/2016, 10:34 AM

## 2016-05-28 NOTE — Progress Notes (Signed)
PARENTERAL NUTRITION CONSULT NOTE - Follow Up  Pharmacy Consult for TPN Indication: intolerance to enteral feedings  Allergies  Allergen Reactions  . Niacin And Related Other (See Comments)    Extreme fatigue    Patient Measurements: Height: 5\' 8"  (172.7 cm) Weight: 238 lb 8.6 oz (108.2 kg) IBW/kg (Calculated) : 68.4 Adjusted Body Weight: 100 kg Usual Weight: 113 kg on admit 05/16/16  Vital Signs: Temp: 98.1 F (36.7 C) (08/06 0517) Temp Source: Oral (08/06 0517) BP: 155/67 (08/06 0517) Pulse Rate: 63 (08/06 0517) Intake/Output from previous day: 08/05 0701 - 08/06 0700 In: -  Out: 430 [Urine:350; Drains:20; Stool:60] Intake/Output from this shift: No intake/output data recorded.  Labs:  Recent Labs  05/28/16 0416  WBC 8.1  HGB 8.5*  HCT 27.2*  PLT 339     Recent Labs  05/26/16 0432 05/27/16 0500 05/28/16 0416  NA 138 138 138  K 3.3* 3.5 3.9  CL 104 105 102  CO2 28 28 29   GLUCOSE 159* 128* 169*  BUN 17 17 17   CREATININE 0.77 0.68 0.71  CALCIUM 8.1* 8.2* 8.3*  MG 1.9 1.9 2.0  PHOS 2.5 3.5 3.5  PROT 5.2*  --   --   ALBUMIN 1.8*  --   --   AST 22  --   --   ALT 16*  --   --   ALKPHOS 51  --   --   BILITOT 0.5  --   --    Estimated Creatinine Clearance: 102.4 mL/min (by C-G formula based on SCr of 0.8 mg/dL).    Recent Labs  05/27/16 2002 05/27/16 2349 05/28/16 0402  GLUCAP 151* 145* 177*    Medical History: Past Medical History:  Diagnosis Date  . CAD (coronary artery disease)    4 stents in right artery  . Complex tear of medial meniscus of left knee as current injury 07/26/2012   [place]  . Diabetes mellitus   . Glaucoma 2006   on drops, followed by [place]  . Hyperlipidemia   . Hypertension   . Myocardial infarction ([place])    1991  . Sleep apnea    uses CPAP regularly    Medications:  Scheduled:  . antiseptic oral rinse  7 mL Mouth Rinse q12n4p  . atenolol  50 mg Oral Daily  . chlorhexidine  15 mL Mouth Rinse BID  .  Chlorhexidine Gluconate Cloth  6 each Topical Q0600  . famotidine (PEPCID) IV  20 mg Intravenous Q12H  . heparin subcutaneous  5,000 Units Subcutaneous Q8H  . HYDROmorphone   Intravenous Q4H  . insulin aspart  0-20 Units Subcutaneous Q4H  . insulin detemir  12 Units Subcutaneous BID  . mupirocin ointment  1 application Nasal BID  . piperacillin-tazobactam (ZOSYN)  IV  3.375 g Intravenous Q8H   Infusions:  . Marland KitchenTPN (CLINIMIX-E) Adult 75 mL/hr at 05/27/16 1745   And  . fat emulsion 240 mL (05/27/16 1746)  . sodium chloride 10 mL/hr at 05/27/16 1800   PRN: diphenhydrAMINE **OR** diphenhydrAMINE, metoprolol, naloxone **AND** sodium chloride flush, ondansetron (ZOFRAN) IV, phenol, sodium chloride flush, zolpidem  Insulin Requirements: 21 units Novolog SSI last 24hrs (stable) + 12 units in TPN (increased 8/4) + Levemir 12 mg BID (increased from 10 units on 8/5)  Current Nutrition: NPO  IVF: NS at 10 ml/hr  Central access: PICC placed 8/2 TPN start date:  8/2  ASSESSMENT  HPI: 70 y/o with h/o CAD and 4 stents, OSA on CPAP, DM, HTN, HLD who presents for abdominal pain & constipation. Found to have an intra-abdominal abscess on CT scan. IR unable to place drain as abscess is quite deep.  Initally, plan was to continue IV antibiotics in hopes to avoid surgery. Repeat CT on 7/31 showed many new small abscessed, prior abscess improving.  Significant events:  8/1: Expl lap, drainage of abscesses, sigmoid colectomy and colostomy  8/2: Abdomen-slight firm and distended, colostomy edematous and viable with no output. Place PICC and begin TPN per Pharmacy. 8/3: switch to Clinimix with lytes 8/6: per notes, pt feeling much better, still with some ileus, cont IV abxs for now and NPO  Today:   Glucose: at goal range 125-151 with one outlier of 177 - Levemir started 8/2 (PTA amaryl & metformin on  hold while NPO) - MD titrating  Electrolytes: WNL  Renal: SCr WNL  LFTs: WNL  TGs: mildly elevated  Prealbumin: low but improved  NUTRITIONAL GOALS                                                                                             RD recs: 1900-2100 kcal, 110-120g protein per day Clinimix 5/15 at a goal rate of 32ml/hr + 20% fat emulsion at 41ml/hr to provide: 108g/day protein, 2013Kcal/day.  PLAN                                                                                                                         At 1800 today:  Advance Clinimix E 5/15 to goal of 90 ml/hr  20% fat emulsion at 44ml/hr.  TPN to contain standard multivitamins and trace elements.  [facility] at [facility]  Continue insulin in TPN at 12 units/day particularly since Md continues to adjust Levemir  Continue SSI q4h resistant scale; Levemir 12 units BID per MD  TPN lab panels on Mondays & Thursdays.  F/u daily.   Adrian Saran, PharmD, BCPS Pager [phone] 05/28/2016 7:49 AM

## 2016-05-29 DIAGNOSIS — I10 Essential (primary) hypertension: Secondary | ICD-10-CM | POA: Diagnosis not present

## 2016-05-29 DIAGNOSIS — A419 Sepsis, unspecified organism: Secondary | ICD-10-CM | POA: Diagnosis not present

## 2016-05-29 DIAGNOSIS — K5720 Diverticulitis of large intestine with perforation and abscess without bleeding: Secondary | ICD-10-CM | POA: Diagnosis not present

## 2016-05-29 LAB — COMPREHENSIVE METABOLIC PANEL
ALK PHOS: 81 U/L (ref 38–126)
ALT: 21 U/L (ref 17–63)
AST: 24 U/L (ref 15–41)
Albumin: 2.1 g/dL — ABNORMAL LOW (ref 3.5–5.0)
Anion gap: 6 (ref 5–15)
BILIRUBIN TOTAL: 0.9 mg/dL (ref 0.3–1.2)
BUN: 14 mg/dL (ref 6–20)
CALCIUM: 8.4 mg/dL — AB (ref 8.9–10.3)
CO2: 30 mmol/L (ref 22–32)
CREATININE: 0.59 mg/dL — AB (ref 0.61–1.24)
Chloride: 100 mmol/L — ABNORMAL LOW (ref 101–111)
Glucose, Bld: 180 mg/dL — ABNORMAL HIGH (ref 65–99)
Potassium: 3.9 mmol/L (ref 3.5–5.1)
Sodium: 136 mmol/L (ref 135–145)
Total Protein: 5.7 g/dL — ABNORMAL LOW (ref 6.5–8.1)

## 2016-05-29 LAB — DIFFERENTIAL
BASOS ABS: 0 10*3/uL (ref 0.0–0.1)
BASOS PCT: 0 %
EOS ABS: 0.2 10*3/uL (ref 0.0–0.7)
Eosinophils Relative: 2 %
Lymphocytes Relative: 14 %
Lymphs Abs: 1.1 10*3/uL (ref 0.7–4.0)
MONOS PCT: 10 %
Monocytes Absolute: 0.8 10*3/uL (ref 0.1–1.0)
NEUTROS PCT: 74 %
Neutro Abs: 5.9 10*3/uL (ref 1.7–7.7)

## 2016-05-29 LAB — GLUCOSE, CAPILLARY
GLUCOSE-CAPILLARY: 200 mg/dL — AB (ref 65–99)
GLUCOSE-CAPILLARY: 223 mg/dL — AB (ref 65–99)
GLUCOSE-CAPILLARY: 211 mg/dL — AB (ref 65–99)
Glucose-Capillary: 163 mg/dL — ABNORMAL HIGH (ref 65–99)
Glucose-Capillary: 185 mg/dL — ABNORMAL HIGH (ref 65–99)
Glucose-Capillary: 208 mg/dL — ABNORMAL HIGH (ref 65–99)

## 2016-05-29 LAB — PHOSPHORUS: Phosphorus: 3.4 mg/dL (ref 2.5–4.6)

## 2016-05-29 LAB — CBC
HEMATOCRIT: 28.3 % — AB (ref 39.0–52.0)
Hemoglobin: 9.1 g/dL — ABNORMAL LOW (ref 13.0–17.0)
MCH: 30.6 pg (ref 26.0–34.0)
MCHC: 32.2 g/dL (ref 30.0–36.0)
MCV: 95.3 fL (ref 78.0–100.0)
Platelets: 335 10*3/uL (ref 150–400)
RBC: 2.97 MIL/uL — ABNORMAL LOW (ref 4.22–5.81)
RDW: 13.6 % (ref 11.5–15.5)
WBC: 8 10*3/uL (ref 4.0–10.5)

## 2016-05-29 LAB — MAGNESIUM: Magnesium: 2.1 mg/dL (ref 1.7–2.4)

## 2016-05-29 LAB — TRIGLYCERIDES: Triglycerides: 222 mg/dL — ABNORMAL HIGH (ref <150)

## 2016-05-29 LAB — PREALBUMIN: Prealbumin: 8.2 mg/dL — ABNORMAL LOW (ref 18–38)

## 2016-05-29 MED ORDER — FAT EMULSION 20 % IV EMUL
250.0000 mL | INTRAVENOUS | Status: AC
  Administered 2016-05-29: 250 mL via INTRAVENOUS
  Filled 2016-05-29: qty 250

## 2016-05-29 MED ORDER — TRACE MINERALS CR-CU-MN-SE-ZN 10-1000-500-60 MCG/ML IV SOLN
INTRAVENOUS | Status: AC
  Administered 2016-05-29: 18:00:00 via INTRAVENOUS
  Filled 2016-05-29: qty 1000

## 2016-05-29 MED ORDER — ENOXAPARIN SODIUM 40 MG/0.4ML ~~LOC~~ SOLN
40.0000 mg | SUBCUTANEOUS | Status: DC
  Administered 2016-05-29 – 2016-06-01 (×4): 40 mg via SUBCUTANEOUS
  Filled 2016-05-29 (×4): qty 0.4

## 2016-05-29 MED ORDER — OXYCODONE-ACETAMINOPHEN 5-325 MG PO TABS: 1.0000 | ORAL_TABLET | ORAL | Status: DC | PRN

## 2016-05-29 NOTE — Progress Notes (Signed)
[facility] PROGRESS NOTE    Progress Note  Evan Avery  [id number] DOB: 12/12/1945 DOA: 05/16/2016 PCP: [name], MD     Brief Narrative:   Evan Avery is an 70 y.o. male past medical history of coronary artery disease with a stent placement 92, obesity came into the hospital complaining of abdominal pain scan of the abdomen and pelvis showed 5 cm developing abscess in the pelvis likely secondary to diverticular sigmoid colon Perforation, surgery was consulted, who recommended IR to place cutaneous drain, aspirin and Plavix were held. IR was unable to play drain his abscess is quite deep, As the patient decompensated on 05/23/2012 surgery proceeded with intervention and colostomy, Started on TNA and antibiotics were continued. Wound VAC to be placed soon.  Assessment/Plan:   Sepsis ([place]) due to Colonic diverticular abscess due to Diverticulitis of colon with perforation: Cont IV antibiotics Zosyn. Surgery to rec when to start aspirin and Plavix. Exploratory laparotomy and drainage of multiple intra-abdominal abscess with sigmoid colectomy and a colostomy on 8.1.2017. He is on Dilaudid PCA pump. PICC line inserted on 05/24/2012 On 05/29/2016 continues to have generalized abdominal pain with palpation, abdomen appears distended. Case discussed with general surgery plan to start clears today He states ambulating around his room  Diabetes mellitus type 2, uncontrolled, with complications ([place]) Blood glucose is trending up Currently on Levemir 15 units subcutaneous twice a day and cont sliding scale insulin, with CBGs every 4 hours.  CAD with stents: 4 stents in the RCA pain 2009, copnt to hold aspirin and Plavix Surgery to advise when to resume. Resume atenolol.  Cough: Hypoxic: Continue Tessalon Perles.  Essential hypertension: Resume atenolol continue to hold ACE inhibitor.  OSA on CPAP: Continue Cipro tonight.  Obesity, Class II, BMI 35-39.9,  with comorbidity (HCC)   DVT prophylaxis: lovenox Family Communication:none Disposition Plan/Barrier to D/C: to be detemine Code Status:     Code Status Orders        Start     Ordered   05/23/16 1436  Full code  Continuous     05/23/16 1435    Code Status History    Date Active Date Inactive Code Status Order ID Comments User Context   05/16/2016  4:58 PM 05/23/2016  2:35 PM Full Code [id number]  [name], MD Inpatient   10/11/2011  8:57 AM 10/13/2011  3:49 PM Full Code [id number]  [name], RN Inpatient    Advance Directive Documentation   Flowsheet Row Most Recent Value  Type of Advance Directive  Healthcare Power of Attorney, Living will  Pre-existing out of facility DNR order (yellow form or pink MOST form)  No data  "MOST" Form in Place?  No data        IV Access:    Peripheral IV   Procedures and diagnostic studies:   No results found.   Medical Consultants:    None.  Anti-Infectives:   Zosyn  Subjective:    [name] sitting at bedside chair, states doing well presently has had intermittent abdominal pain, on Dilaudid PCA  Objective:    Vitals:   05/29/16 0207 05/29/16 0450 05/29/16 0811 05/29/16 1030  BP: (!) 142/62 (!) 155/57    Pulse: 62 (!) 57  68  Resp: 15 13 13    Temp: 97.6 F (36.4 C) 97.7 F (36.5 C)    TempSrc: Oral Oral    SpO2: 96% 97% 97%   Weight:      Height:  Intake/Output Summary (Last 24 hours) at 05/29/16 1323 Last data filed at 05/29/16 1012  Gross per 24 hour  Intake             1550 ml  Output             2410 ml  Net             -860 ml   Filed Weights   05/16/16 1626 05/16/16 1706  Weight: 113.4 kg (250 lb) 108.2 kg (238 lb 8.6 oz)    Exam: General exam: In no acute distress. Respiratory system: Good air movement and clear to auscultation. Cardiovascular system: S1 & S2 heard, RRR. Gastrointestinal system: Abdomen is Distended, colostomy in place, More distended than  yesterday. Central nervous system: Alert and oriented. No focal neurological deficits. Extremities: No pedal edema. Skin: No rashes, lesions or ulcers Psychiatry: Judgement and insight appear normal. Mood & affect appropriate.    Data Reviewed:    Labs: Basic Metabolic Panel:  Recent Labs Lab 05/25/16 0426 05/26/16 0432 05/27/16 0500 05/28/16 0416 05/29/16 0400  NA 137 138 138 138 136  K 3.7 3.3* 3.5 3.9 3.9  CL 104 104 105 102 100*  CO2 27 28 28 29 30   GLUCOSE 229* 159* 128* 169* 180*  BUN 13 17 17 17 14   CREATININE 0.81 0.77 0.68 0.71 0.59*  CALCIUM 8.1* 8.1* 8.2* 8.3* 8.4*  MG 1.8 1.9 1.9 2.0 2.1  PHOS 2.2* 2.5 3.5 3.5 3.4   GFR Estimated Creatinine Clearance: 102.4 mL/min (by C-G formula based on SCr of 0.8 mg/dL). Liver Function Tests:  Recent Labs Lab 05/25/16 0426 05/26/16 0432 05/29/16 0400  AST 23 22 24   ALT 19 16* 21  ALKPHOS 50 51 81  BILITOT 0.6 0.5 0.9  PROT 4.8* 5.2* 5.7*  ALBUMIN 1.9* 1.8* 2.1*   No results for input(s): LIPASE, AMYLASE in the last 168 hours. No results for input(s): AMMONIA in the last 168 hours. Coagulation profile No results for input(s): INR, PROTIME in the last 168 hours.  CBC:  Recent Labs Lab 05/24/16 0343 05/25/16 0426 05/28/16 0416 05/29/16 0400  WBC 14.4* 16.9* 8.1 8.0  NEUTROABS  --  14.9*  --  5.9  HGB 12.5* 9.6* 8.5* 9.1*  HCT 38.3* 29.0* 27.2* 28.3*  MCV 95.3 95.7 95.8 95.3  PLT 409* 374 339 335   Cardiac Enzymes: No results for input(s): CKTOTAL, CKMB, CKMBINDEX, TROPONINI in the last 168 hours. BNP (last 3 results) No results for input(s): PROBNP in the last 8760 hours. CBG:  Recent Labs Lab 05/28/16 2113 05/28/16 2333 05/29/16 0416 05/29/16 0741 05/29/16 1212  GLUCAP 177* 181* 200* 223* 211*   D-Dimer: No results for input(s): DDIMER in the last 72 hours. Hgb A1c: No results for input(s): HGBA1C in the last 72 hours. Lipid Profile:  Recent Labs  05/29/16 0400  TRIG 222*    Thyroid function studies: No results for input(s): TSH, T4TOTAL, T3FREE, THYROIDAB in the last 72 hours.  Invalid input(s): FREET3 Anemia work up: No results for input(s): VITAMINB12, FOLATE, FERRITIN, TIBC, IRON, RETICCTPCT in the last 72 hours. Sepsis Labs:  Recent Labs Lab 05/24/16 0343 05/25/16 0426 05/28/16 0416 05/29/16 0400  WBC 14.4* 16.9* 8.1 8.0   Microbiology Recent Results (from the past 240 hour(s))  Surgical pcr screen     Status: Abnormal   Collection Time: 05/23/16  7:59 AM  Result Value Ref Range Status   MRSA, PCR POSITIVE (A) NEGATIVE Final    Comment:  RESULT CALLED TO, READ BACK BY AND VERIFIED WITH: T COBLE RN AT 1055 ON 08.01.17 BY SHUEA    Staphylococcus aureus POSITIVE (A) NEGATIVE Final    Comment:        The Xpert SA Assay (FDA approved for NASAL specimens in patients over 54 years of age), is one component of a comprehensive surveillance program.  Test performance has been validated by [facility] for patients greater than or equal to 92 year old. It is not intended to diagnose infection nor to guide or monitor treatment.      Medications:   . antiseptic oral rinse  7 mL Mouth Rinse q12n4p  . atenolol  50 mg Oral Daily  . chlorhexidine  15 mL Mouth Rinse BID  . Chlorhexidine Gluconate Cloth  6 each Topical Q0600  . enoxaparin (LOVENOX) injection  40 mg Subcutaneous Q24H  . famotidine (PEPCID) IV  20 mg Intravenous Q12H  . HYDROmorphone   Intravenous Q4H  . insulin aspart  0-20 Units Subcutaneous Q4H  . insulin detemir  15 Units Subcutaneous BID  . piperacillin-tazobactam (ZOSYN)  IV  3.375 g Intravenous Q8H   Continuous Infusions: . Marland KitchenTPN (CLINIMIX-E) Adult 90 mL/hr at 05/28/16 1731   And  . fat emulsion 240 mL (05/28/16 1731)  . sodium chloride 10 mL/hr at 05/27/16 1800  . Marland KitchenTPN (CLINIMIX-E) Adult     And  . fat emulsion      Time spent: 15 min   LOS: 13 days   Kelvin Cellar  [facility] Pager  [phone]  *Please refer to [email or web address], password [email or web address] to get updated schedule on who will round on this patient, as hospitalists switch teams weekly. If 7PM-7AM, please contact night-coverage at [email or web address], password [email or web address] for any overnight needs.  05/29/2016, 1:23 PM

## 2016-05-29 NOTE — Progress Notes (Signed)
Nutrition Follow-up  DOCUMENTATION CODES:   Obesity unspecified  INTERVENTION:   TPN per Pharmacy RD to continue to monitor for needs following diet advancement  NUTRITION DIAGNOSIS:   Inadequate oral intake related to inability to eat as evidenced by NPO status.  Now on clear liquids.  GOAL:   Patient will meet greater than or equal to 90% of their needs  Meeting with TPN.  MONITOR:   Weight trends, Labs, Skin, I & O's, Other (Comment) (TPN regimen)  ASSESSMENT:    70 y.o. male with medical history significant of CAD with a stent in 1992, obesity and OSA came to the hospital complaining about abdominal pain. His symptoms started about 10 days ago, he also had obstipation and tried multiple OTC laxatives without success. About 45 days ago started to have chills and he came in this morning because of the pain and chills worsens. CT scan showed intra-abdominal abscess. 8/1 s/p ex lap, sigmoid colectomy and colostomy, abscess drainage  Patient continues TPN: Clinimix E 5/15 at goal of 90 ml/hr and 20% fat emulsion @ 10 ml/hr providing 2013 kcal and 108g protein. Per surgery, diet will advance to clear liquids today. RD will monitor for tolerance.   Medications reviewed. Labs reviewed: CBGs: 200-223 Mg/Phos/K WNL  Plan per Pharmacy 8/7: At 1800 today:  continue Clinimix E 5/15 at 90 ml/hr (goal)  20% fat emulsion at 50ml/hr.  Diet Order:  .TPN (CLINIMIX-E) Adult TPN (CLINIMIX-E) Adult Diet clear liquid Room service appropriate? Yes; Fluid consistency: Thin  Skin:  Wound (see comment) (Abdominal incision from 05/23/16)  Last BM:  8/1  Height:   Ht Readings from Last 1 Encounters:  05/16/16 5\' 8"  (1.727 m)    Weight:   Wt Readings from Last 1 Encounters:  05/16/16 238 lb 8.6 oz (108.2 kg)    Ideal Body Weight:  70 kg (kg)  BMI:  Body mass index is 36.27 kg/m.  Estimated Nutritional Needs:   Kcal:  1900-2100  Protein:  110-120 grams  Fluid:  >/= 2  L/day  EDUCATION NEEDS:   No education needs identified at this time  Clayton Bibles, MS, RD, LDN Pager: [phone] After Hours Pager: [phone]

## 2016-05-29 NOTE — Progress Notes (Signed)
[facility]:  [phone] General Surgery Progress Note   LOS: 13 days  POD -  6 Days Post-Op  Assessment/Plan: 1. EXPLORATORY LAPAROTOMY, SMALL BOWEL RESECTION,  DRAINAGE OF MULTIPLE INTRA  ABDOMINAL ABSCESS,  End COLOSTOMY - 05/23/2016 - [facility]  For perforated diverticulitis  Zosyn - 7/25 >>>  To start clear liquids  1A.  Open wound with VAC  2.  DM 3.  OSA 4.  Morbid obesity 5.  Anemia -   Hgb - 9.1 - 05/29/2016 6.  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  DVT prophylaxis - , will change to Lovenox to decrease the # of shots   Active Problems:   Diabetes mellitus type 2, uncontrolled, with complications (HCC)   OSA on CPAP   Sepsis ([place])   Essential hypertension   Obesity, Class II, BMI 35-39.9, with comorbidity ([place])   Diverticulitis of colon with perforation   Colonic diverticular abscess   Subjective:  Doing okay.  But a little confused, particularly when he received ambien.  2 sons in the room.  Objective:   Vitals:   05/29/16 0811 05/29/16 1030  BP:    Pulse:  68  Resp: 13   Temp:       Intake/Output from previous day:  08/06 0701 - 08/07 0700 In: 1640 [I.V.:1440; IV Piggyback:200] Out: 2710 [Urine:2600; Drains:60; Stool:50]  Intake/Output this shift:  Total I/O In: -  Out: 400 [Urine:400]   Physical Exam:   General: Obese WM who is alert and oriented.    HEENT: Normal. Pupils equal. .   Lungs: Clear.  IS - 1,200 cc   Abdomen: Soft, obese.     Wound: VAC on wound (MWF), ostomy on left abdomen, drain RLQ - 60 cc recorded last 24 hours   Neurologic:  Grossly intact to motor and sensory function.   Psychiatric: Has normal mood and affect.   Lab Results:    Recent Labs  05/28/16 0416 05/29/16 0400  WBC 8.1 8.0  HGB 8.5* 9.1*  HCT 27.2* 28.3*  PLT 339 335    BMET   Recent Labs  05/28/16 0416 05/29/16 0400  NA 138 136  K 3.9 3.9  CL 102 100*  CO2 29 30  GLUCOSE 169* 180*  BUN 17 14  CREATININE 0.71 0.59*  CALCIUM  8.3* 8.4*    PT/INR  No results for input(s): LABPROT, INR in the last 72 hours.  ABG  No results for input(s): PHART, HCO3 in the last 72 hours.  Invalid input(s): PCO2, PO2   Studies/Results:  No results found.   Anti-infectives:   Anti-infectives    Start     Dose/Rate Route Frequency Ordered Stop   05/23/16 1530  piperacillin-tazobactam (ZOSYN) IVPB 3.375 g     3.375 g 12.5 mL/hr over 240 Minutes Intravenous Every 8 hours 05/23/16 1435     05/16/16 2200  piperacillin-tazobactam (ZOSYN) IVPB 3.375 g  Status:  Discontinued     3.375 g 12.5 mL/hr over 240 Minutes Intravenous Every 8 hours 05/16/16 1710 05/23/16 2152   05/16/16 1500  piperacillin-tazobactam (ZOSYN) IVPB 3.375 g     3.375 g 100 mL/hr over 30 Minutes Intravenous  Once 05/16/16 1435 05/16/16 1613   05/16/16 1430  piperacillin-tazobactam (ZOSYN) IVPB 4.5 g  Status:  Discontinued     4.5 g 200 mL/hr over 30 Minutes Intravenous  Once 05/16/16 1357 05/16/16 1435      Alphonsa Overall, MD, FACS Pager: [facility] Office: [phone] 05/29/2016

## 2016-05-29 NOTE — Consult Note (Signed)
[place] Nurse ostomy consult note Stoma type/location: LUQ Colostomy with midline abdominal wound with NPWT In place.  Stomal assessment/size: 1 3/4" round, well budded.  Abdomen is swollen and tender and stoma appears slightly flush from 6 to 9 o'clock.  Peristomal assessment: Edematous abdomen, blistering noted in umbilicus, will protect this skin with barrier ring.  Treatment options for stomal/peristomal skin:Barrier ring  Output Scant brown effluent in pouch Ostomy pouching: 2pc. 2 1/4 pouch system with barrier ring .   Education provided: Son at bedside.  Reviewed pouch change and emptying frequency.  Demonstrated roll closure.  Discussed trimming excess hair as it grows back in.    Enrolled patient in [place] program: Yes  [place] Nurse wound consult note Reason for Consult:NPWT (VAC) dressing change to midline abdomen with LUQ colostomy Wound type: Surgical wound Pressure Ulcer POA: N/A Measurement: 18.8 cm x 4 cm x 0.4 cm  Wound bed: adipose tissue with new granulation tissue noted.  Drainage (amount, consistency, odor) Minimal serosanguinous  No odor.  Periwound: Intact.  Dressing procedure/placement/frequency:Cleansed with NS.  Applied 3 pieces black foam to wound bed.  COvered wth drape and seal is immediately achieved at 125 mmHg.  Change Mon/Wed/Fri.  [facility] team will follow.   Domenic Moras RN BSN [place] Pager [phone]

## 2016-05-29 NOTE — Progress Notes (Signed)
PARENTERAL NUTRITION CONSULT NOTE - Follow Up  Pharmacy Consult for TPN Indication: intolerance to enteral feedings  Allergies  Allergen Reactions  . Niacin And Related Other (See Comments)    Extreme fatigue    Patient Measurements: Height: 5\' 8"  (172.7 cm) Weight: 238 lb 8.6 oz (108.2 kg) IBW/kg (Calculated) : 68.4 Adjusted Body Weight: 100 kg Usual Weight: 113 kg on admit 05/16/16  Vital Signs: Temp: 97.7 F (36.5 C) (08/07 0450) Temp Source: Oral (08/07 0450) BP: 155/57 (08/07 0450) Pulse Rate: 57 (08/07 0450) Intake/Output from previous day: 08/06 0701 - 08/07 0700 In: 1640 [I.V.:1440; IV Piggyback:200] Out: 2710 [Urine:2600; Drains:60; Stool:50] Intake/Output from this shift: No intake/output data recorded.  Labs:  Recent Labs  05/28/16 0416 05/29/16 0400  WBC 8.1 8.0  HGB 8.5* 9.1*  HCT 27.2* 28.3*  PLT 339 335     Recent Labs  05/27/16 0500 05/28/16 0416 05/29/16 0400  NA 138 138 136  K 3.5 3.9 3.9  CL 105 102 100*  CO2 28 29 30   GLUCOSE 128* 169* 180*  BUN 17 17 14   CREATININE 0.68 0.71 0.59*  CALCIUM 8.2* 8.3* 8.4*  MG 1.9 2.0 2.1  PHOS 3.5 3.5 3.4  PROT  --   --  5.7*  ALBUMIN  --   --  2.1*  AST  --   --  24  ALT  --   --  21  ALKPHOS  --   --  81  BILITOT  --   --  0.9  PREALBUMIN  --   --  8.2*  TRIG  --   --  222*   Estimated Creatinine Clearance: 102.4 mL/min (by C-G formula based on SCr of 0.8 mg/dL).    Recent Labs  05/28/16 2333 05/29/16 0416 05/29/16 0741  GLUCAP 181* 200* 223*    Medical History: Past Medical History:  Diagnosis Date  . CAD (coronary artery disease)    4 stents in right artery  . Complex tear of medial meniscus of left knee as current injury 07/26/2012   [place]  . Diabetes mellitus   . Glaucoma 2006   on drops, followed by [place]  . Hyperlipidemia   . Hypertension   . Myocardial infarction ([place])    1991  . Sleep apnea    uses CPAP regularly    Medications:  Scheduled:  . antiseptic oral  rinse  7 mL Mouth Rinse q12n4p  . atenolol  50 mg Oral Daily  . chlorhexidine  15 mL Mouth Rinse BID  . Chlorhexidine Gluconate Cloth  6 each Topical Q0600  . famotidine (PEPCID) IV  20 mg Intravenous Q12H  . heparin subcutaneous  5,000 Units Subcutaneous Q8H  . HYDROmorphone   Intravenous Q4H  . insulin aspart  0-20 Units Subcutaneous Q4H  . insulin detemir  15 Units Subcutaneous BID  . mupirocin ointment  1 application Nasal BID  . piperacillin-tazobactam (ZOSYN)  IV  3.375 g Intravenous Q8H   Infusions:  . Marland KitchenTPN (CLINIMIX-E) Adult 90 mL/hr at 05/28/16 1731   And  . fat emulsion 240 mL (05/28/16 1731)  . sodium chloride 10 mL/hr at 05/27/16 1800   PRN: diphenhydrAMINE **OR** diphenhydrAMINE, metoprolol, naloxone **AND** sodium chloride flush, ondansetron (ZOFRAN) IV, phenol, sodium chloride flush, zolpidem  Insulin Requirements: 23 units Novolog SSI last 24hrs (stable) + 12 units in TPN (increased 8/4) + Levemir 15 mg BID (increased from 12 units on 8/6)  Current Nutrition: NPO  IVF: NS at 10 ml/hr  Central  access: PICC placed 8/2 TPN start date:  8/2  ASSESSMENT                                                                                                          HPI: 70 y/o with h/o CAD and 4 stents, OSA on CPAP, DM, HTN, HLD who presents for abdominal pain & constipation. Found to have an intra-abdominal abscess on CT scan. IR unable to place drain as abscess is quite deep.  Initally, plan was to continue IV antibiotics in hopes to avoid surgery. Repeat CT on 7/31 showed many new small abscessed, prior abscess improving.  Significant events:  8/1: Expl lap, drainage of abscesses, sigmoid colectomy and colostomy  8/2: Abdomen-slight firm and distended, colostomy edematous and viable with no output. Place PICC and begin TPN per Pharmacy. 8/3: switch to Clinimix with lytes 8/6: per notes, pt feeling much better, still with some ileus, cont IV abxs for now and NPO  Today:    Glucose: 177-223, goal < 150   Levemir started 8/2 (PTA amaryl & metformin on hold while NPO) - MD titrating  Electrolytes: WNL  Renal: SCr WNL  LFTs: WNL  TGs: mildly elevated  Prealbumin: low but improved  NUTRITIONAL GOALS                                                                                             RD recs: 1900-2100 kcal, 110-120g protein per day Clinimix 5/15 at a goal rate of 61ml/hr + 20% fat emulsion at 14ml/hr to provide: 108g/day protein, 2013Kcal/day.  PLAN                                                                                                                         At 1800 today:  continue Clinimix E 5/15 at 90 ml/hr (goal)  20% fat emulsion at 40ml/hr.  TPN to contain standard multivitamins and trace elements.  Continue IVF at [facility]  Continue insulin in TPN at 12 units/day particularly since Md continues to adjust Levemir  Continue SSI q4h resistant scale; Levemir 15 units BID per MD  TPN lab panels on Mondays & Thursdays.  F/u daily.  Dolly Rias RPh 05/29/2016, 9:48 AM Pager [phone]

## 2016-05-29 NOTE — Progress Notes (Signed)
[facility]  Patient Status: New  [facility] is providing the following services: RN and Wound Vac. Wound Vac order form placed on chart.  If patient discharges after hours, please call [phone].   Evan Avery 05/29/2016, 12:29 PM

## 2016-05-29 NOTE — Progress Notes (Signed)
Spoke with patient and brother at bedside, patient plans to d/c to home and has lots of support, brother states someone will be with him 24/7. Offered choice for [facility] services, he has used [facility] in the past and would like to use them again. Requesting a lift chair, contacted [facility] to see if insurance would cover. Currently has vac and ostomy, likely will need PT as well. Patient agreeable to these services. Contacted [facility] for referral. Currently advancing diet and plan to wean TNA, converting PCA to orals. Only ambulating short distances now but only 1+assist. Will continue to follow for d/c needs.

## 2016-05-30 ENCOUNTER — Other Ambulatory Visit: Payer: Self-pay | Admitting: *Deleted

## 2016-05-30 DIAGNOSIS — I10 Essential (primary) hypertension: Secondary | ICD-10-CM | POA: Diagnosis not present

## 2016-05-30 DIAGNOSIS — E46 Unspecified protein-calorie malnutrition: Secondary | ICD-10-CM | POA: Diagnosis not present

## 2016-05-30 DIAGNOSIS — K5720 Diverticulitis of large intestine with perforation and abscess without bleeding: Secondary | ICD-10-CM | POA: Diagnosis not present

## 2016-05-30 DIAGNOSIS — A419 Sepsis, unspecified organism: Secondary | ICD-10-CM | POA: Diagnosis not present

## 2016-05-30 LAB — GLUCOSE, CAPILLARY
GLUCOSE-CAPILLARY: 214 mg/dL — AB (ref 65–99)
GLUCOSE-CAPILLARY: 179 mg/dL — AB (ref 65–99)
Glucose-Capillary: 143 mg/dL — ABNORMAL HIGH (ref 65–99)
Glucose-Capillary: 150 mg/dL — ABNORMAL HIGH (ref 65–99)
Glucose-Capillary: 172 mg/dL — ABNORMAL HIGH (ref 65–99)

## 2016-05-30 MED ORDER — FUROSEMIDE 10 MG/ML IJ SOLN
40.0000 mg | Freq: Once | INTRAMUSCULAR | Status: AC
  Administered 2016-05-30: 40 mg via INTRAVENOUS
  Filled 2016-05-30: qty 4

## 2016-05-30 MED ORDER — TRACE MINERALS CR-CU-MN-SE-ZN 10-1000-500-60 MCG/ML IV SOLN
INTRAVENOUS | Status: AC
  Administered 2016-05-30: 18:00:00 via INTRAVENOUS
  Filled 2016-05-30: qty 2000

## 2016-05-30 MED ORDER — OXYCODONE HCL 5 MG PO TABS
10.0000 mg | ORAL_TABLET | ORAL | Status: DC | PRN
  Administered 2016-05-30: 15 mg via ORAL
  Administered 2016-05-30 (×2): 10 mg via ORAL
  Administered 2016-05-30 – 2016-06-02 (×7): 15 mg via ORAL
  Filled 2016-05-30 (×2): qty 2
  Filled 2016-05-30 (×9): qty 3

## 2016-05-30 MED ORDER — HYDROMORPHONE HCL 1 MG/ML IJ SOLN: 0.5000 mg | INTRAMUSCULAR | Status: DC | PRN

## 2016-05-30 MED ORDER — FAT EMULSION 20 % IV EMUL
240.0000 mL | INTRAVENOUS | Status: AC
  Administered 2016-05-30: 240 mL via INTRAVENOUS
  Filled 2016-05-30: qty 250

## 2016-05-30 MED ORDER — ACETAMINOPHEN 500 MG PO TABS
1000.0000 mg | ORAL_TABLET | Freq: Four times a day (QID) | ORAL | Status: DC
  Administered 2016-05-30 – 2016-06-02 (×12): 1000 mg via ORAL
  Filled 2016-05-30 (×10): qty 2

## 2016-05-30 NOTE — Consult Note (Signed)
   [facility] CM Inpatient Consult   05/30/2016  Evan Avery 15-Sep-1946 [id number]    Patient screened for long-term disease management services with [facility] program on behalf of his [employer] insurance. Went to bedside to discuss and offer [facility] services. [name] is agreeable and written consent signed. Explained that he will receive post hospital transition of care calls and will be evaluated for monthly home visits. Confirmed Primary Care MD is Dr. Danise Mina.  Confirmed best contact number as [phone]. Explained that [facility] will not interfere or replace services provided by home health. Reports he lives with his wife. Denies issues with obtaining medications or with transportation. Discussed [facility] follow up will also include DM management and education. Last Hgb A1C listed is 7.2. Left [facility] Care Management packet and contact information at bedside. Will make inpatient RNCM aware that patient will be followed by [facility] post hospital discharge.   Will request to be assigned to [facility] for transition of care. He will also have home health. [name] has history of DM, HTN, obstructive sleep apnea. He is s/p exploratory laparotomy, small bowel resection, drainage of multiple intra abdominal abscess, end colostomy.    [name], MSN-Ed, RN,BSN [facility] Liaison [phone]

## 2016-05-30 NOTE — Progress Notes (Signed)
7 Days Post-Op  Subjective: He is up in the chair, wound VAC in place. I can't really see the ostomy. He does have gas and there is some stool colored fluid in the base of the bag. Lungs are clear. He does have lower extremity edema and swelling.  His I&O is around 8 L positive  Objective: Vital signs in last 24 hours: Temp:  [97.9 F (36.6 C)-98.6 F (37 C)] 97.9 F (36.6 C) (08/08 0550) Pulse Rate:  [52-68] 53 (08/07 2137) Resp:  [11-15] 14 (08/08 0801) BP: (155-161)/(63-82) 155/82 (08/08 0550) SpO2:  [95 %-98 %] 97 % (08/08 0801) Last BM Date: 05/23/16 By mouth 600 recorded Urine 1450 Drain 35 Stool:  None recorded Afebrile vital signs are stable Labs yesterday were stable. Pre-albumin 8.2 No films Intake/Output from previous day: 08/07 0701 - 08/08 0700 In: 840 [P.O.:600; I.V.:240] Out: 1485 [Urine:1450; Drains:35] Intake/Output this shift: Total I/O In: -  Out: 200 [Urine:200]  General appearance: alert, cooperative and no distress Resp: clear to auscultation bilaterally GI: He is up in the chair, so good exams difficult. He has a large abdomen surgery feels a bit tight. He does have a few bowel sounds. There is gas and some dark feculent-looking fluid in the base of the ostomy bag. Wound VAC is in place. Extremities: He has mild edema in both lower extremities  Lab Results:   Recent Labs  05/28/16 0416 05/29/16 0400  WBC 8.1 8.0  HGB 8.5* 9.1*  HCT 27.2* 28.3*  PLT 339 335    BMET  Recent Labs  05/28/16 0416 05/29/16 0400  NA 138 136  K 3.9 3.9  CL 102 100*  CO2 29 30  GLUCOSE 169* 180*  BUN 17 14  CREATININE 0.71 0.59*  CALCIUM 8.3* 8.4*   PT/INR No results for input(s): LABPROT, INR in the last 72 hours.   Recent Labs Lab 05/25/16 0426 05/26/16 0432 05/29/16 0400  AST 23 22 24   ALT 19 16* 21  ALKPHOS 50 51 81  BILITOT 0.6 0.5 0.9  PROT 4.8* 5.2* 5.7*  ALBUMIN 1.9* 1.8* 2.1*     Lipase     Component Value Date/Time   LIPASE 11  05/16/2016 1031     Studies/Results: No results found. Prior to Admission medications   Medication Sig Start Date End Date Taking? Authorizing Provider  aspirin 81 MG tablet Take 81 mg by mouth daily.     Yes Historical Provider, MD  atenolol (TENORMIN) 50 MG tablet TAKE ONE & ONE-HALF TABLETS BY MOUTH ONCE DAILY Patient taking differently: TAKE ONE & ONE-HALF TABLETS =75mg  BY MOUTH ONCE DAILY 05/02/16  Yes [name], MD  atorvastatin (LIPITOR) 40 MG tablet TAKE ONE-HALF TABLET BY MOUTH ONCE DAILY Patient taking differently: TAKE ONE-HALF TABLET= 20mg  BY MOUTH ONCE DAILY 05/02/16  Yes [name], MD  brimonidine (ALPHAGAN) 0.2 % ophthalmic solution Place 1 drop into both eyes 2 (two) times daily.    Yes Historical Provider, MD  clopidogrel (PLAVIX) 75 MG tablet TAKE ONE TABLET BY MOUTH ONCE DAILY 05/02/16  Yes [name], MD  Coenzyme Q10 200 MG capsule Take 200 mg by mouth daily.   Yes Historical Provider, MD  glimepiride (AMARYL) 2 MG tablet Take 1 tablet (2 mg total) by mouth daily with breakfast. 10/22/15  Yes [name], MD  Glucosamine-Chondroitin-Vit D3 1500-1200-800 MG-MG-UNIT PACK Take 1 tablet by mouth daily.   Yes Historical Provider, MD  latanoprost (XALATAN) 0.005 % ophthalmic solution Place 1 drop into both  eyes at bedtime.    Yes Historical Provider, MD  lisinopril (PRINIVIL,ZESTRIL) 2.5 MG tablet Take 1 tablet (2.5 mg total) by mouth daily. 06/18/14  Yes Troy Sine, MD  metFORMIN (GLUCOPHAGE) 1000 MG tablet TAKE ONE TABLET BY MOUTH TWICE DAILY WITH MEALS 01/31/16  Yes [name], MD  Multiple Vitamin (MULTIVITAMIN) tablet Take 1 tablet by mouth daily.   Yes Historical Provider, MD  omega-3 acid ethyl esters (LOVAZA) 1 g capsule TAKE TWO CAPSULES BY MOUTH TWICE DAILY 11/15/15  Yes [name], MD  Turmeric 450 MG CAPS Take 1 capsule by mouth daily.   Yes Historical Provider, MD  fenofibrate 160 MG tablet Take 1 tablet (160 mg total) by mouth  daily. Patient not taking: Reported on 05/16/2016 03/29/16   Troy Sine, MD    Medications: . antiseptic oral rinse  7 mL Mouth Rinse q12n4p  . atenolol  50 mg Oral Daily  . chlorhexidine  15 mL Mouth Rinse BID  . enoxaparin (LOVENOX) injection  40 mg Subcutaneous Q24H  . famotidine (PEPCID) IV  20 mg Intravenous Q12H  . HYDROmorphone   Intravenous Q4H  . insulin aspart  0-20 Units Subcutaneous Q4H  . insulin detemir  15 Units Subcutaneous BID  . piperacillin-tazobactam (ZOSYN)  IV  3.375 g Intravenous Q8H   . sodium chloride 10 mL/hr at 05/27/16 1800  . Marland KitchenTPN (CLINIMIX-E) Adult 90 mL/hr at 05/29/16 1746   And  . fat emulsion 250 mL (05/29/16 1746)   AODM CAD with prior stents - on aspirin and Plavix preadmission Obstructive sleep apnea Morbid obesity Anemia MRSA per nares  Assessment/Plan Perforated diverticulitis with multiple intra-abdominal abscesses Status post oratory laparotomy, drainage of multiple intra-abdominal abscesses sigmoid colectomy and colostomy, 05/23/16 Dr. Jackolyn Confer.  POD 7 Postop ileus -improving Malnutrition-pre-albumin 8.2 - 05/29/16 FEN: Clear liquid/TNA  Continue clears for now ID:  Day 14 Zosyn DVT: Lovenox/SCD  Plan: I'm going to stop his PCA, getting back on CPAP at night. Oral and IV pain medicines. Once we get him up to full liquids on start weaning his TNA. Check with Dr. Coralyn Pear about possibly starting him on some Lasix for fluid diuresis.  After I take his dressing down tomorrow on see about restarting his Plavix and aspirin for his stents.   .  LOS: 14 days    JENNINGS,WILLARD 05/30/2016 [phone]  Agree with above. On clear liquids - if tolerated today - advance diet tomorrow and stop TPN. Drain only put out 35 cc.  Will probably remove tomorrow. Still needs to walk more. Son in room.   Alphonsa Overall, MD, [facility] Pager: [phone] Office phone:  [phone]

## 2016-05-30 NOTE — Progress Notes (Addendum)
PARENTERAL NUTRITION CONSULT NOTE - Follow Up  Pharmacy Consult for TPN Indication: Perforated diverticulitis with abscesses, now s/p abscess drainage, sigmoid colectomy, end colostomy  Allergies  Allergen Reactions  . Niacin And Related Other (See Comments)    Extreme fatigue    Patient Measurements: Height: 5\' 8"  (172.7 cm) Weight: 238 lb 8.6 oz (108.2 kg) IBW/kg (Calculated) : 68.4 Adjusted Body Weight: 100 kg Usual Weight: 113 kg on admit 05/16/16  Vital Signs: Temp: 97.9 F (36.6 C) (08/08 0550) Temp Source: Oral (08/08 0550) BP: 155/82 (08/08 0550) Intake/Output from previous day: 08/07 0701 - 08/08 0700 In: 840 [P.O.:600; I.V.:240] Out: 1485 [Urine:1450; Drains:35] Intake/Output from this shift: Total I/O In: -  Out: 450 [Urine:450]  Labs:  Recent Labs  05/28/16 0416 05/29/16 0400  WBC 8.1 8.0  HGB 8.5* 9.1*  HCT 27.2* 28.3*  PLT 339 335     Recent Labs  05/28/16 0416 05/29/16 0400  NA 138 136  K 3.9 3.9  CL 102 100*  CO2 29 30  GLUCOSE 169* 180*  BUN 17 14  CREATININE 0.71 0.59*  CALCIUM 8.3* 8.4*  MG 2.0 2.1  PHOS 3.5 3.4  PROT  --  5.7*  ALBUMIN  --  2.1*  AST  --  24  ALT  --  21  ALKPHOS  --  81  BILITOT  --  0.9  PREALBUMIN  --  8.2*  TRIG  --  222*  Corrected calcium: 9.9 Estimated Creatinine Clearance: 102.4 mL/min (by C-G formula based on SCr of 0.8 mg/dL).    Recent Labs  05/30/16 0000 05/30/16 0338 05/30/16 0747  GLUCAP 185* 143* 150*    Medical History: Past Medical History:  Diagnosis Date  . CAD (coronary artery disease)    4 stents in right artery  . Complex tear of medial meniscus of left knee as current injury 07/26/2012   [place]  . Diabetes mellitus   . Glaucoma 2006   on drops, followed by [place]  . Hyperlipidemia   . Hypertension   . Myocardial infarction ([place])    1991  . Sleep apnea    uses CPAP regularly    Medications:  Scheduled:  . antiseptic oral rinse  7 mL Mouth Rinse q12n4p  . atenolol   50 mg Oral Daily  . chlorhexidine  15 mL Mouth Rinse BID  . enoxaparin (LOVENOX) injection  40 mg Subcutaneous Q24H  . famotidine (PEPCID) IV  20 mg Intravenous Q12H  . HYDROmorphone   Intravenous Q4H  . insulin aspart  0-20 Units Subcutaneous Q4H  . insulin detemir  15 Units Subcutaneous BID  . piperacillin-tazobactam (ZOSYN)  IV  3.375 g Intravenous Q8H   Infusions:  . sodium chloride 10 mL/hr at 05/27/16 1800  . Marland KitchenTPN (CLINIMIX-E) Adult 90 mL/hr at 05/29/16 1746   And  . fat emulsion 250 mL (05/29/16 1746)   PRN: diphenhydrAMINE **OR** diphenhydrAMINE, metoprolol, naloxone **AND** sodium chloride flush, ondansetron (ZOFRAN) IV, oxyCODONE-acetaminophen, phenol, sodium chloride flush, zolpidem  Insulin Requirements: 27 units Novolog SSI last 24hrs (increasing) + 12 units in TPN (increased 8/4) + Levemir 15 mg BID (increased from 12 units BID on 8/6)  Current Nutrition: CL diet; Clinimix-E 5/15 at 90 mL/hr, 20% Fat Emulsion at 10 mL/hr  IVF: NS at 10 ml/hr  Central access: PICC placed 8/2 TPN start date:  8/2  ASSESSMENT  HPI: 70 y/o with h/o CAD and 4 stents, OSA on CPAP, DM, HTN, HLD who presents for abdominal pain & constipation, was found to have multiple intra-abdominal abscesses due to perforated sigmoid diverticulitis, not suitable for percutaneous drainage.  Underwent exploratory laparotomy, drainage of multiple intra-abdominal abscesses, sigmoid colectomy, and end colostomy on 8/1.  TPN initiated 8/2.  Significant events:  8/3: switched to Clinimix with lytes 8/6: per notes, pt feeling much better, still with some ileus, cont IV abxs for now and keep NPO  Today:   Glucose: CBGs 143 - 223, goal < 150.  Requiring substantial amounts of SSI in addition to insulin in TPN plus Levemir.  PTA Amaryl and metformin on hold.  Electrolytes (8/7): WNL except Cl slightly  low  Renal (8/7): SCr stable, good UOP  LFTs (8/7): all below ULN  TGs (8/7): chronically elevated but < 250 this admission and improving.  Prealbumin: low but improved  (4.6 -> 5.1 -> 8.2)  NUTRITIONAL GOALS                                                                                             RD recs: 1900-2100 kcal, 110-120g protein per day Clinimix-E 5/15 at a goal rate of 29ml/hr + 20% fat emulsion at 34ml/hr to provide: 108g/day protein, 2013Kcal/day.  PLAN                                                                                                                         At 1800 today:  Continue Clinimix E 5/15 at 90 ml/hr (goal rate)  Continue 20% fat emulsion at 33ml/hr.  TPN to contain standard multivitamins and trace elements.  Increase insulin in TPN to 20 units/day.  MD is managing dosing of Levemir.  Continue SSI q4h resistant scale;  TPN lab panels on Mondays & Thursdays.  Follow clinical course daily.  Clayburn Pert, PharmD, BCPS Pager: [phone] 05/30/2016  10:24 AM

## 2016-05-30 NOTE — Progress Notes (Signed)
[facility] PROGRESS NOTE    Progress Note  Evan Avery  [id number] DOB: 21-Jan-1946 DOA: 05/16/2016 PCP: [name], MD     Brief Narrative:   Evan Avery is an 70 y.o. male past medical history of coronary artery disease with a stent placement 92, obesity came into the hospital complaining of abdominal pain scan of the abdomen and pelvis showed 5 cm developing abscess in the pelvis likely secondary to diverticular sigmoid colon Perforation, surgery was consulted, who recommended IR to place cutaneous drain, aspirin and Plavix were held. IR was unable to play drain his abscess is quite deep, As the patient decompensated on 05/23/2012 surgery proceeded with intervention and colostomy, Started on TNA and antibiotics were continued. Wound VAC to be placed soon.  Assessment/Plan:   Sepsis ([place]) due to Colonic diverticular abscess due to Diverticulitis of colon with perforation: Cont IV antibiotics Zosyn. Surgery to rec when to start aspirin and Plavix. Exploratory laparotomy and drainage of multiple intra-abdominal abscess with sigmoid colectomy and a colostomy on 8.1.2017. PICC line inserted on 05/24/2012 On 05/29/2016 continues to have generalized abdominal pain with palpation, abdomen appears distended. He was started on clears on 05/29/2016, with plans to weaning him off TNA once he starts full liquids On 05/30/2016 surgery recommending stopping Dilaudid PCA, overall seems to be making progress  Fluid overload -On exam has 2+ bilateral lower extremity pitting edema, review of eye and O's showing he is +7.7 L -Will administer 40 mg of IV Lasix now, monitor urine output, reassess in a.m.  Diabetes mellitus type 2, uncontrolled, with complications ([place]) Currently on Levemir 15 units subcutaneous twice a day and cont sliding scale insulin, continue sliding scale coverage every 4 hours.  CAD with stents: 4 stents in the RCA pain 2009, copnt to hold aspirin and  Plavix Surgery to advise when to resume. Resume atenolol.  Cough: Hypoxic: Continue Tessalon Perles.  Essential hypertension: Resume atenolol continue to hold ACE inhibitor.  OSA on CPAP: Continue Cipro tonight.  Obesity, Class II, BMI 35-39.9, with comorbidity (HCC)   DVT prophylaxis: lovenox Family Communication:none Disposition Plan/Barrier to D/C: to be detemine Code Status: Full code    Code Status Orders        Start     Ordered   05/23/16 1436  Full code  Continuous     05/23/16 1435    Code Status History    Date Active Date Inactive Code Status Order ID Comments User Context   05/16/2016  4:58 PM 05/23/2016  2:35 PM Full Code [id number]  [name], MD Inpatient   10/11/2011  8:57 AM 10/13/2011  3:49 PM Full Code [id number]  [name], RN Inpatient    Advance Directive Documentation   Flowsheet Row Most Recent Value  Type of Advance Directive  Healthcare Power of Attorney, Living will  Pre-existing out of facility DNR order (yellow form or pink MOST form)  No data  "MOST" Form in Place?  No data        IV Access:    Peripheral IV   Procedures and diagnostic studies:   No results found.   Medical Consultants:    None.  Anti-Infectives:   Zosyn  Subjective:    Evan Avery ambulating around the hallways earlier today. Is sitting at bedside chair, in good spirits, reports ongoing abdominal distention  Objective:    Vitals:   05/30/16 0550 05/30/16 0801 05/30/16 1036 05/30/16 1432  BP: (!) 155/82   (!) 137/56  Pulse:   67 (!) 49  Resp:  14  15  Temp: 97.9 F (36.6 C)   97.9 F (36.6 C)  TempSrc: Oral   Oral  SpO2:  97%  96%  Weight:      Height:        Intake/Output Summary (Last 24 hours) at 05/30/16 1529 Last data filed at 05/30/16 0919  Gross per 24 hour  Intake              840 ml  Output             1535 ml  Net             -695 ml   Filed Weights   05/16/16 1626 05/16/16 1706  Weight: 113.4 kg (250 lb)  108.2 kg (238 lb 8.6 oz)    Exam: General exam: In no acute distress. Respiratory system: Good air movement and clear to auscultation. Cardiovascular system: S1 & S2 heard, RRR. Gastrointestinal system: Abdomen is Distended, colostomy in place, Abdominal distention remains unchanged compared to yesterday's exam Central nervous system: Alert and oriented. No focal neurological deficits. Extremities: No pedal edema. Skin: No rashes, lesions or ulcers Psychiatry: Judgement and insight appear normal. Mood & affect appropriate.    Data Reviewed:    Labs: Basic Metabolic Panel:  Recent Labs Lab 05/25/16 0426 05/26/16 0432 05/27/16 0500 05/28/16 0416 05/29/16 0400  NA 137 138 138 138 136  K 3.7 3.3* 3.5 3.9 3.9  CL 104 104 105 102 100*  CO2 27 28 28 29 30   GLUCOSE 229* 159* 128* 169* 180*  BUN 13 17 17 17 14   CREATININE 0.81 0.77 0.68 0.71 0.59*  CALCIUM 8.1* 8.1* 8.2* 8.3* 8.4*  MG 1.8 1.9 1.9 2.0 2.1  PHOS 2.2* 2.5 3.5 3.5 3.4   GFR Estimated Creatinine Clearance: 102.4 mL/min (by C-G formula based on SCr of 0.8 mg/dL). Liver Function Tests:  Recent Labs Lab 05/25/16 0426 05/26/16 0432 05/29/16 0400  AST 23 22 24   ALT 19 16* 21  ALKPHOS 50 51 81  BILITOT 0.6 0.5 0.9  PROT 4.8* 5.2* 5.7*  ALBUMIN 1.9* 1.8* 2.1*   No results for input(s): LIPASE, AMYLASE in the last 168 hours. No results for input(s): AMMONIA in the last 168 hours. Coagulation profile No results for input(s): INR, PROTIME in the last 168 hours.  CBC:  Recent Labs Lab 05/24/16 0343 05/25/16 0426 05/28/16 0416 05/29/16 0400  WBC 14.4* 16.9* 8.1 8.0  NEUTROABS  --  14.9*  --  5.9  HGB 12.5* 9.6* 8.5* 9.1*  HCT 38.3* 29.0* 27.2* 28.3*  MCV 95.3 95.7 95.8 95.3  PLT 409* 374 339 335   Cardiac Enzymes: No results for input(s): CKTOTAL, CKMB, CKMBINDEX, TROPONINI in the last 168 hours. BNP (last 3 results) No results for input(s): PROBNP in the last 8760 hours. CBG:  Recent Labs Lab  05/29/16 2001 05/30/16 0000 05/30/16 0338 05/30/16 0747 05/30/16 1258  GLUCAP 163* 185* 143* 150* 214*   D-Dimer: No results for input(s): DDIMER in the last 72 hours. Hgb A1c: No results for input(s): HGBA1C in the last 72 hours. Lipid Profile:  Recent Labs  05/29/16 0400  TRIG 222*   Thyroid function studies: No results for input(s): TSH, T4TOTAL, T3FREE, THYROIDAB in the last 72 hours.  Invalid input(s): FREET3 Anemia work up: No results for input(s): VITAMINB12, FOLATE, FERRITIN, TIBC, IRON, RETICCTPCT in the last 72 hours. Sepsis Labs:  Recent Labs Lab 05/24/16 0343 05/25/16 [id number]  05/28/16 0416 05/29/16 0400  WBC 14.4* 16.9* 8.1 8.0   Microbiology Recent Results (from the past 240 hour(s))  Surgical pcr screen     Status: Abnormal   Collection Time: 05/23/16  7:59 AM  Result Value Ref Range Status   MRSA, PCR POSITIVE (A) NEGATIVE Final    Comment: RESULT CALLED TO, READ BACK BY AND VERIFIED WITH: T COBLE RN AT 1055 ON 08.01.17 BY SHUEA    Staphylococcus aureus POSITIVE (A) NEGATIVE Final    Comment:        The Xpert SA Assay (FDA approved for NASAL specimens in patients over 43 years of age), is one component of a comprehensive surveillance program.  Test performance has been validated by [facility] for patients greater than or equal to 56 year old. It is not intended to diagnose infection nor to guide or monitor treatment.      Medications:   . acetaminophen  1,000 mg Oral Q6H  . antiseptic oral rinse  7 mL Mouth Rinse q12n4p  . atenolol  50 mg Oral Daily  . chlorhexidine  15 mL Mouth Rinse BID  . enoxaparin (LOVENOX) injection  40 mg Subcutaneous Q24H  . famotidine (PEPCID) IV  20 mg Intravenous Q12H  . insulin aspart  0-20 Units Subcutaneous Q4H  . insulin detemir  15 Units Subcutaneous BID  . piperacillin-tazobactam (ZOSYN)  IV  3.375 g Intravenous Q8H   Continuous Infusions: . sodium chloride 10 mL/hr at 05/27/16 1800  . Marland KitchenTPN  (CLINIMIX-E) Adult     And  . fat emulsion    . Marland KitchenTPN (CLINIMIX-E) Adult 90 mL/hr at 05/29/16 1746   And  . fat emulsion 250 mL (05/29/16 1746)    Time spent: 15 min   LOS: 14 days   Kelvin Cellar  [facility] Pager [phone]  *Please refer to [email or web address], password [email or web address] to get updated schedule on who will round on this patient, as hospitalists switch teams weekly. If 7PM-7AM, please contact night-coverage at [email or web address], password [email or web address] for any overnight needs.  05/30/2016, 3:29 PM

## 2016-05-31 DIAGNOSIS — E1165 Type 2 diabetes mellitus with hyperglycemia: Secondary | ICD-10-CM

## 2016-05-31 DIAGNOSIS — E46 Unspecified protein-calorie malnutrition: Secondary | ICD-10-CM | POA: Diagnosis not present

## 2016-05-31 DIAGNOSIS — Z794 Long term (current) use of insulin: Secondary | ICD-10-CM

## 2016-05-31 DIAGNOSIS — K5720 Diverticulitis of large intestine with perforation and abscess without bleeding: Secondary | ICD-10-CM

## 2016-05-31 DIAGNOSIS — A419 Sepsis, unspecified organism: Secondary | ICD-10-CM | POA: Diagnosis not present

## 2016-05-31 DIAGNOSIS — I10 Essential (primary) hypertension: Secondary | ICD-10-CM | POA: Diagnosis not present

## 2016-05-31 DIAGNOSIS — E118 Type 2 diabetes mellitus with unspecified complications: Secondary | ICD-10-CM

## 2016-05-31 LAB — CBC
HCT: 26.9 % — ABNORMAL LOW (ref 39.0–52.0)
Hemoglobin: 8.9 g/dL — ABNORMAL LOW (ref 13.0–17.0)
MCH: 31.1 pg (ref 26.0–34.0)
MCHC: 33.1 g/dL (ref 30.0–36.0)
MCV: 94.1 fL (ref 78.0–100.0)
PLATELETS: 390 10*3/uL (ref 150–400)
RBC: 2.86 MIL/uL — AB (ref 4.22–5.81)
RDW: 13.8 % (ref 11.5–15.5)
WBC: 9.3 10*3/uL (ref 4.0–10.5)

## 2016-05-31 LAB — BASIC METABOLIC PANEL
Anion gap: 8 (ref 5–15)
BUN: 15 mg/dL (ref 6–20)
CALCIUM: 8.6 mg/dL — AB (ref 8.9–10.3)
CO2: 27 mmol/L (ref 22–32)
CREATININE: 0.67 mg/dL (ref 0.61–1.24)
Chloride: 102 mmol/L (ref 101–111)
GFR calc Af Amer: >60 mL/min (ref >60)
GFR calc non Af Amer: >60 mL/min (ref >60)
Glucose, Bld: 191 mg/dL — ABNORMAL HIGH (ref 65–99)
Potassium: 4.1 mmol/L (ref 3.5–5.1)
SODIUM: 137 mmol/L (ref 135–145)

## 2016-05-31 LAB — GLUCOSE, CAPILLARY
GLUCOSE-CAPILLARY: 104 mg/dL — AB (ref 65–99)
GLUCOSE-CAPILLARY: 109 mg/dL — AB (ref 65–99)
GLUCOSE-CAPILLARY: 126 mg/dL — AB (ref 65–99)
GLUCOSE-CAPILLARY: 145 mg/dL — AB (ref 65–99)
GLUCOSE-CAPILLARY: 174 mg/dL — AB (ref 65–99)
GLUCOSE-CAPILLARY: 188 mg/dL — AB (ref 65–99)
Glucose-Capillary: 200 mg/dL — ABNORMAL HIGH (ref 65–99)
Glucose-Capillary: 224 mg/dL — ABNORMAL HIGH (ref 65–99)

## 2016-05-31 MED ORDER — FAT EMULSION 20 % IV EMUL
240.0000 mL | INTRAVENOUS | Status: AC
  Administered 2016-05-31: 240 mL via INTRAVENOUS
  Filled 2016-05-31: qty 250

## 2016-05-31 MED ORDER — METFORMIN HCL 500 MG PO TABS: 1000.0000 mg | ORAL_TABLET | Freq: Two times a day (BID) | ORAL | Status: DC

## 2016-05-31 MED ORDER — FAMOTIDINE 20 MG PO TABS
20.0000 mg | ORAL_TABLET | Freq: Every day | ORAL | Status: DC
  Administered 2016-05-31 – 2016-06-01 (×2): 20 mg via ORAL
  Filled 2016-05-31 (×2): qty 1

## 2016-05-31 MED ORDER — GLIMEPIRIDE 2 MG PO TABS
2.0000 mg | ORAL_TABLET | Freq: Every day | ORAL | Status: DC
  Administered 2016-06-01: 2 mg via ORAL
  Filled 2016-05-31 (×2): qty 1

## 2016-05-31 MED ORDER — BRIMONIDINE TARTRATE 0.2 % OP SOLN
1.0000 [drp] | Freq: Two times a day (BID) | OPHTHALMIC | Status: DC
  Administered 2016-05-31 – 2016-06-02 (×5): 1 [drp] via OPHTHALMIC
  Filled 2016-05-31: qty 5

## 2016-05-31 MED ORDER — TRACE MINERALS CR-CU-MN-SE-ZN 10-1000-500-60 MCG/ML IV SOLN
INTRAVENOUS | Status: DC
  Filled 2016-05-31: qty 2160

## 2016-05-31 MED ORDER — TRACE MINERALS CR-CU-MN-SE-ZN 10-1000-500-60 MCG/ML IV SOLN
INTRAVENOUS | Status: DC
  Administered 2016-05-31: 18:00:00 via INTRAVENOUS
  Filled 2016-05-31: qty 1080

## 2016-05-31 MED ORDER — LATANOPROST 0.005 % OP SOLN
1.0000 [drp] | Freq: Every day | OPHTHALMIC | Status: DC
  Administered 2016-05-31 – 2016-06-01 (×2): 1 [drp] via OPHTHALMIC
  Filled 2016-05-31: qty 2.5

## 2016-05-31 MED ORDER — INSULIN DETEMIR 100 UNIT/ML ~~LOC~~ SOLN
20.0000 [IU] | Freq: Two times a day (BID) | SUBCUTANEOUS | Status: DC
  Administered 2016-05-31 – 2016-06-01 (×3): 20 [IU] via SUBCUTANEOUS
  Filled 2016-05-31 (×5): qty 0.2

## 2016-05-31 NOTE — Progress Notes (Signed)
8 Days Post-Op  Subjective: He lost his suction port on his wound vac last PM.  That was changed without me seeing it early this AM.  He has stool in the ostomy. I told the family to walk him.He has done very little of that so far.  I will get PT to see also and be sure he doesn't need anything from them.   He is anxious to get more PO and I have advanced his diet.    Objective: Vital signs in last 24 hours: Temp:  [97.9 F (36.6 C)-98.2 F (36.8 C)] 98.2 F (36.8 C) (08/09 0551) Pulse Rate:  [49-62] 61 (08/09 0944) Resp:  [15-16] 16 (08/09 0551) BP: (137-174)/(56-80) 174/64 (08/09 0944) SpO2:  [96 %-100 %] 96 % (08/09 0944) Last BM Date: 05/23/16  Intake/Output from previous day: 08/08 0701 - 08/09 0700 In: 1034 [P.O.:780; I.V.:254] Out: [id number] [Urine:4350; Drains:50; Stool:20] Intake/Output this shift: Total I/O In: -  Out: 200 [Urine:200]  General appearance: alert, cooperative and no distress Resp: mostly clear, few rales in the base. GI: large abdomen, few BS, ostomy working, stool in bag.  Site OK when changed this AM.    Lab Results:   Recent Labs  05/29/16 0400 05/31/16 0320  WBC 8.0 9.3  HGB 9.1* 8.9*  HCT 28.3* 26.9*  PLT 335 390    BMET  Recent Labs  05/29/16 0400 05/31/16 0320  NA 136 137  K 3.9 4.1  CL 100* 102  CO2 30 27  GLUCOSE 180* 191*  BUN 14 15  CREATININE 0.59* 0.67  CALCIUM 8.4* 8.6*   PT/INR No results for input(s): LABPROT, INR in the last 72 hours.   Recent Labs Lab 05/25/16 0426 05/26/16 0432 05/29/16 0400  AST 23 22 24   ALT 19 16* 21  ALKPHOS 50 51 81  BILITOT 0.6 0.5 0.9  PROT 4.8* 5.2* 5.7*  ALBUMIN 1.9* 1.8* 2.1*     Lipase     Component Value Date/Time   LIPASE 11 05/16/2016 1031     Studies/Results: No results found.  Medications: . acetaminophen  1,000 mg Oral Q6H  . antiseptic oral rinse  7 mL Mouth Rinse q12n4p  . atenolol  50 mg Oral Daily  . chlorhexidine  15 mL Mouth Rinse BID  . enoxaparin  (LOVENOX) injection  40 mg Subcutaneous Q24H  . famotidine (PEPCID) IV  20 mg Intravenous Q12H  . insulin aspart  0-20 Units Subcutaneous Q4H  . insulin detemir  15 Units Subcutaneous BID  . piperacillin-tazobactam (ZOSYN)  IV  3.375 g Intravenous Q8H    Assessment/Plan Perforated diverticulitis with multiple intra-abdominal abscesses  Status post oratory laparotomy, drainage of multiple intra-abdominal abscesses sigmoid colectomy and colostomy, 05/23/16 Dr. Jackolyn Confer.  POD 8 Postop ileus -improving Malnutrition-pre-albumin 8.2 - 05/29/16 FEN: Clear liquid/TNA  Continue clears for now ID:  Day 14 Zosyn DVT: Lovenox/SCD  Plan:  Wean TNA, advance diet, restart some of his PO meds and work on getting him ready for discharge on Friday.    See if we can stop antibiotics; add probiotic, he is on atenolol, restarting eye drops, Amaryl.  I was going to restart Metformin tomorrow, and have put order in but will defer to Dr. Clementeen Graham.   Nursing can pull drain today.  On Oxy IR and tylenol for pain.    LOS: 15 days    JENNINGS,Evan Avery 05/31/2016 [phone]  Walked around hall x 2. Looks better. Anxious to go home.  Getting closer.  PO  intake modest. Son in room.  Alphonsa Overall, MD, [facility] Pager: [phone] Office phone:  [phone]

## 2016-05-31 NOTE — Consult Note (Signed)
[place] Nurse ostomy follow up Stoma type/location: LUQ colostomy Stomal assessment/size: 2 inches round, moist, red, slightly elevated Peristomal assessment: Intact Treatment options for stomal/peristomal skin: Skin barrier ring Output small amount of red/brown liquid effluent in pouch Ostomy pouching: 2pc. 2 and 3/4 inch pouching system Education provided: Due to hour (10:30pm) no patient education is provided at this time.  Patient expressed appreciation for pouch and NPWT dressing change. Enrolled patient in [facility] Start Discharge program: Yes   [place] Nurse wound follow up Wound type:Surgical Measurement: 19cm x 5cm x 0.5cm Wound bed:red, moist Drainage (amount, consistency, odor) small amount serous, no odor Periwound:intact Dressing procedure/placement/frequency: Bedside RN contact [place] Nurse because of NPWT dressing leak that was not able to be repaired/resolved at 10:30pm.  Dressing is due to be changed in am; change performed at this time.  Dressing removed and wound cleansed.  1 piece of black foam used to obliterate dead space, small piece of skin barrier ring used to fill in defect at umbilicus. Drape applied and patient attached to 187mmHg continuous negative pressure.  An immediate seal is achieved.  Next dressing change date is Friday, August 11th.  [place] nursing team will follow, and will remain available to this patient, the nursing, surgical and medical teams.    Thanks, Maudie Flakes, MSN, RN, [place], Arther Abbott  Pager# [phone]

## 2016-05-31 NOTE — Progress Notes (Signed)
RT set up patient home unit CPAP. 2L O2 bleed in needed. Patient is able to place himself on. No assistance needed. Patient will call if he has trouble.

## 2016-05-31 NOTE — Progress Notes (Signed)
PARENTERAL NUTRITION CONSULT NOTE - Follow Up  Pharmacy Consult for TPN Indication: Perforated diverticulitis with abscesses, now s/p abscess drainage, sigmoid colectomy, end colostomy  Allergies  Allergen Reactions  . Niacin And Related Other (See Comments)    Extreme fatigue    Patient Measurements: Height: 5\' 8"  (172.7 cm) Weight: 238 lb 8.6 oz (108.2 kg) IBW/kg (Calculated) : 68.4 Adjusted Body Weight: 100 kg Usual Weight: 113 kg on admit 05/16/16  Vital Signs: Temp: 98.2 F (36.8 C) (08/09 0551) Temp Source: Oral (08/09 0551) BP: 174/64 (08/09 0944) Pulse Rate: 61 (08/09 0944) Intake/Output from previous day: 08/08 0701 - 08/09 0700 In: 1034 [P.O.:780; I.V.:254] Out: 4420 [Urine:4350; Drains:50; Stool:20] Intake/Output from this shift: Total I/O In: -  Out: 200 [Urine:200]  Labs:  Recent Labs  05/29/16 0400 05/31/16 0320  WBC 8.0 9.3  HGB 9.1* 8.9*  HCT 28.3* 26.9*  PLT 335 390     Recent Labs  05/29/16 0400 05/31/16 0320  NA 136 137  K 3.9 4.1  CL 100* 102  CO2 30 27  GLUCOSE 180* 191*  BUN 14 15  CREATININE 0.59* 0.67  CALCIUM 8.4* 8.6*  MG 2.1  --   PHOS 3.4  --   PROT 5.7*  --   ALBUMIN 2.1*  --   AST 24  --   ALT 21  --   ALKPHOS 81  --   BILITOT 0.9  --   PREALBUMIN 8.2*  --   TRIG 222*  --   Corrected calcium: 10.1 Estimated Creatinine Clearance: 102.4 mL/min (by C-G formula based on SCr of 0.8 mg/dL).    Recent Labs  05/31/16 0654 05/31/16 0740 05/31/16 1200  GLUCAP 109* 188* 224*    Medical History: Past Medical History:  Diagnosis Date  . CAD (coronary artery disease)    4 stents in right artery  . Complex tear of medial meniscus of left knee as current injury 07/26/2012   [place]  . Diabetes mellitus   . Glaucoma 2006   on drops, followed by [place]  . Hyperlipidemia   . Hypertension   . Myocardial infarction ([place])    1991  . Sleep apnea    uses CPAP regularly    Medications:  Scheduled:  . acetaminophen   1,000 mg Oral Q6H  . antiseptic oral rinse  7 mL Mouth Rinse q12n4p  . atenolol  50 mg Oral Daily  . brimonidine  1 drop Both Eyes BID  . chlorhexidine  15 mL Mouth Rinse BID  . enoxaparin (LOVENOX) injection  40 mg Subcutaneous Q24H  . famotidine  20 mg Oral QHS  . [START ON 06/01/2016] glimepiride  2 mg Oral Q breakfast  . insulin aspart  0-20 Units Subcutaneous Q4H  . insulin detemir  15 Units Subcutaneous BID  . latanoprost  1 drop Both Eyes QHS  . [START ON 06/01/2016] metFORMIN  1,000 mg Oral BID WC  . piperacillin-tazobactam (ZOSYN)  IV  3.375 g Intravenous Q8H   Infusions:  . Marland KitchenTPN (CLINIMIX-E) Adult    . sodium chloride 10 mL/hr at 05/27/16 1800  . Marland KitchenTPN (CLINIMIX-E) Adult 90 mL/hr at 05/30/16 1803   And  . fat emulsion 240 mL (05/30/16 1804)  . fat emulsion     PRN: HYDROmorphone (DILAUDID) injection, metoprolol, oxyCODONE, phenol, sodium chloride flush, zolpidem  Insulin Requirements: 26 units Novolog SSI last 24hrs  + 20 units in TPN (increased 8/8) + Levemir 15 mg BID (increased from 12 units BID on 8/6)  Current Nutrition: CL diet; Clinimix-E 5/15 at 90 mL/hr, 20% Fat Emulsion at 10 mL/hr  IVF: NS at 10 ml/hr  Central access: PICC placed 8/2 TPN start date:  8/2  ASSESSMENT                                                                                                          HPI: 70 y/o with h/o CAD and 4 stents, OSA on CPAP, DM, HTN, HLD who presents for abdominal pain & constipation, was found to have multiple intra-abdominal abscesses due to perforated sigmoid diverticulitis, not suitable for percutaneous drainage.  Underwent exploratory laparotomy, drainage of multiple intra-abdominal abscesses, sigmoid colectomy, and end colostomy on 8/1.  TPN initiated 8/2.  Significant events:  8/3: switched to Clinimix with lytes 8/6: per notes, pt feeling much better, still with some ileus, cont IV abxs for now and keep NPO 8/7: clear liquid diet started 8/8: surgery  hoping pt will tolerate diet and can be weaned off TPN soon  Today:   Glucose: CBGs 150 - 214, goal < 150.  Requiring substantial amounts of SSI in addition to insulin in TPN plus Levemir.  PTA Amaryl and metformin on hold.  Electrolytes: WNL   Renal: SCr stable, WNL; good UOP  LFTs (8/7): all below ULN  TGs (8/7): chronically elevated but < 250 this admission and improving.  Prealbumin: low but improved  (4.6 -> 5.1 -> 8.2)  NUTRITIONAL GOALS                                                                                             RD recs: 1900-2100 kcal, 110-120g protein per day Clinimix-E 5/15 at a goal rate of 40ml/hr + 20% fat emulsion at 68ml/hr to provide: 108g/day protein, 2013Kcal/day.  PLAN                                                                                                                         At 1800 today:  Continue Clinimix E 5/15 at 90 ml/hr (goal rate)  Continue 20% fat emulsion at 44ml/hr.  TPN to contain standard multivitamins and trace elements.  Increase insulin in TPN to 35  units/day.  MD is managing dosing of Levemir.  Continue SSI q4h resistant scale;  TPN lab panels on Mondays & Thursdays.  Await word on possibility of advancing diet and beginning TPN wean.  Follow clinical course daily.  Clayburn Pert, PharmD, BCPS Pager: [phone]  Addendum:   8/9 update: Surgery ordered to start TPN wean -Reduce TPN infusion rate to 1/2 rate for the next 24hrs ( = 45 ml/hr) and advance diet -Continue lipids at current rate -F/u how pt tolerates diet, further plans on weaning -MD notes possible discharge Friday -Note MD re-ordered home glimepiride and metformin to start 8/10 and pt remains on levemir 15 BID+resistant SSI + insulin in TPN - watch CBGs closely and adjust as warranted  Ralene Bathe, PharmD, BCPS 05/31/2016, 12:27 PM  Pager: [phone]

## 2016-05-31 NOTE — Progress Notes (Signed)
PARENTERAL NUTRITION CONSULT NOTE - Follow Up  Pharmacy Consult for TPN Indication: Perforated diverticulitis with abscesses, now s/p abscess drainage, sigmoid colectomy, end colostomy  Allergies  Allergen Reactions  . Niacin And Related Other (See Comments)    Extreme fatigue    Patient Measurements: Height: 5\' 8"  (172.7 cm) Weight: 238 lb 8.6 oz (108.2 kg) IBW/kg (Calculated) : 68.4 Adjusted Body Weight: 100 kg Usual Weight: 113 kg on admit 05/16/16  Vital Signs: Temp: 98.2 F (36.8 C) (08/09 0551) Temp Source: Oral (08/09 0551) BP: 148/60 (08/09 0551) Pulse Rate: 62 (08/09 0551) Intake/Output from previous day: 08/08 0701 - 08/09 0700 In: 1034 [P.O.:780; I.V.:254] Out: [id number] [Urine:4350; Drains:50; Stool:20] Intake/Output from this shift: No intake/output data recorded.  Labs:  Recent Labs  05/29/16 0400 05/31/16 0320  WBC 8.0 9.3  HGB 9.1* 8.9*  HCT 28.3* 26.9*  PLT 335 390     Recent Labs  05/29/16 0400 05/31/16 0320  NA 136 137  K 3.9 4.1  CL 100* 102  CO2 30 27  GLUCOSE 180* 191*  BUN 14 15  CREATININE 0.59* 0.67  CALCIUM 8.4* 8.6*  MG 2.1  --   PHOS 3.4  --   PROT 5.7*  --   ALBUMIN 2.1*  --   AST 24  --   ALT 21  --   ALKPHOS 81  --   BILITOT 0.9  --   PREALBUMIN 8.2*  --   TRIG 222*  --   Corrected calcium: 10.1 Estimated Creatinine Clearance: 102.4 mL/min (by C-G formula based on SCr of 0.8 mg/dL).    Recent Labs  05/31/16 0330 05/31/16 0654 05/31/16 0740  GLUCAP 200* 109* 188*    Medical History: Past Medical History:  Diagnosis Date  . CAD (coronary artery disease)    4 stents in right artery  . Complex tear of medial meniscus of left knee as current injury 07/26/2012   [place]  . Diabetes mellitus   . Glaucoma 2006   on drops, followed by [place]  . Hyperlipidemia   . Hypertension   . Myocardial infarction ([place])    1991  . Sleep apnea    uses CPAP regularly    Medications:  Scheduled:  . acetaminophen  1,000 mg  Oral Q6H  . antiseptic oral rinse  7 mL Mouth Rinse q12n4p  . atenolol  50 mg Oral Daily  . chlorhexidine  15 mL Mouth Rinse BID  . enoxaparin (LOVENOX) injection  40 mg Subcutaneous Q24H  . famotidine (PEPCID) IV  20 mg Intravenous Q12H  . insulin aspart  0-20 Units Subcutaneous Q4H  . insulin detemir  15 Units Subcutaneous BID  . piperacillin-tazobactam (ZOSYN)  IV  3.375 g Intravenous Q8H   Infusions:  . sodium chloride 10 mL/hr at 05/27/16 1800  . Marland KitchenTPN (CLINIMIX-E) Adult 90 mL/hr at 05/30/16 1803   And  . fat emulsion 240 mL (05/30/16 1804)   PRN: HYDROmorphone (DILAUDID) injection, metoprolol, oxyCODONE, phenol, sodium chloride flush, zolpidem  Insulin Requirements: 26 units Novolog SSI last 24hrs  + 20 units in TPN (increased 8/8) + Levemir 15 mg BID (increased from 12 units BID on 8/6)  Current Nutrition: CL diet; Clinimix-E 5/15 at 90 mL/hr, 20% Fat Emulsion at 10 mL/hr  IVF: NS at 10 ml/hr  Central access: PICC placed 8/2 TPN start date:  8/2  ASSESSMENT  HPI: 70 y/o with h/o CAD and 4 stents, OSA on CPAP, DM, HTN, HLD who presents for abdominal pain & constipation, was found to have multiple intra-abdominal abscesses due to perforated sigmoid diverticulitis, not suitable for percutaneous drainage.  Underwent exploratory laparotomy, drainage of multiple intra-abdominal abscesses, sigmoid colectomy, and end colostomy on 8/1.  TPN initiated 8/2.  Significant events:  8/3: switched to Clinimix with lytes 8/6: per notes, pt feeling much better, still with some ileus, cont IV abxs for now and keep NPO 8/7: clear liquid diet started 8/8: surgery hoping pt will tolerate diet and can be weaned off TPN soon  Today:   Glucose: CBGs 150 - 214, goal < 150.  Requiring substantial amounts of SSI in addition to insulin in TPN plus Levemir.  PTA Amaryl and metformin on  hold.  Electrolytes: WNL   Renal: SCr stable, WNL; good UOP  LFTs (8/7): all below ULN  TGs (8/7): chronically elevated but < 250 this admission and improving.  Prealbumin: low but improved  (4.6 -> 5.1 -> 8.2)  NUTRITIONAL GOALS                                                                                             RD recs: 1900-2100 kcal, 110-120g protein per day Clinimix-E 5/15 at a goal rate of 43ml/hr + 20% fat emulsion at 64ml/hr to provide: 108g/day protein, 2013Kcal/day.  PLAN                                                                                                                         At 1800 today:  Continue Clinimix E 5/15 at 90 ml/hr (goal rate)  Continue 20% fat emulsion at 80ml/hr.  TPN to contain standard multivitamins and trace elements.  Increase insulin in TPN to 35 units/day.  MD is managing dosing of Levemir.  Continue SSI q4h resistant scale;  TPN lab panels on Mondays & Thursdays.  Await word on possibility of advancing diet and beginning TPN wean.  Follow clinical course daily.  Clayburn Pert, PharmD, BCPS Pager: [phone] 05/31/2016  9:32 AM

## 2016-05-31 NOTE — Progress Notes (Signed)
PROGRESS NOTE                                                                                                                                                                                                             Patient Demographics:    Evan Avery, is a 70 y.o. male, DOB - August 31, 1946, [id number]  Admit date - 05/16/2016   Admitting Physician [name], MD  Outpatient Primary MD for the patient is [name], MD  LOS - 15  Outpatient Specialists:none  Chief Complaint  Patient presents with  . Abdominal Pain  . Constipation       Brief Narrative  70 year old obese male with history of coronary artery disease status post stent, presented with abdominal pain. CT abdomen and pelvis showed 5 cm the abscess in the pelvis likely secondary to sigmoid diverticular perforation. Surgery was consulted who initially recommended IR guided percutaneous drain placement but was unsuccessful. 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 Patient developed postop ileus which is now improving.     Subjective:   Patient reports some abdominal discomfort. Tolerating clears.   Assessment  & Plan :   Principal problem Sepsis ([place]) due to Colonic diverticular abscess due to Diverticulitis of colon with perforation:. Exploratory laparotomy and drainage of multiple intra-abdominal abscess with sigmoid colectomy and a colostomy on 8/1. PICC line list on 08/02. Continue IV Zosyn for now. Aspirin and Plavix on hold. Will resume as per surgery recommendation. -Tolerating clears. Weaning she NA. Discontinue Dilaudid PCA and stable on IV and oral pain medications as needed.   Fluid overload Significant positive balance. Started on IV Lasix with good diuresis. Continue current dose and monitor strict I's as well.   Diabetes  mellitus type 2, uncontrolled, with complications (HCC) on Levemir 15 units subcutaneous twice a day with sliding scale coverage. Will increase Levemir dose. Hold metformin.   CAD with stents: 4 stents in the RCA pain 2009. Aspirin and Plavix on hold. Resume as per surgery recommendations. Continue atenolol.   Essential hypertension: Resume atenolol continue to hold ACE inhibitor.  OSA on CPAP:   Obesity, Class II, BMI 35-39.9, with comorbidity (HCC)   DVT prophylaxis: lovenox Family Communication: Brother bedside  Disposition  Code Status: Full code    Code Status : Full  code  Family Communication  : Brother at bedside  Disposition Plan  : Home versus SNF. PT evaluation pending.  Barriers For Discharge : Improving symptoms.  Consults  :   [facility]  Procedures  :  Laparotomy with drainage of multiple intra-abdominal abscesses, sigmoid colectomy with colostomy.  DVT Prophylaxis  :  Lovenox -  Lab Results  Component Value Date   PLT 390 05/31/2016    Antibiotics  :   Anti-infectives    Start     Dose/Rate Route Frequency Ordered Stop   05/23/16 1530  piperacillin-tazobactam (ZOSYN) IVPB 3.375 g     3.375 g 12.5 mL/hr over 240 Minutes Intravenous Every 8 hours 05/23/16 1435     05/16/16 2200  piperacillin-tazobactam (ZOSYN) IVPB 3.375 g  Status:  Discontinued     3.375 g 12.5 mL/hr over 240 Minutes Intravenous Every 8 hours 05/16/16 1710 05/23/16 2152   05/16/16 1500  piperacillin-tazobactam (ZOSYN) IVPB 3.375 g     3.375 g 100 mL/hr over 30 Minutes Intravenous  Once 05/16/16 1435 05/16/16 1613   05/16/16 1430  piperacillin-tazobactam (ZOSYN) IVPB 4.5 g  Status:  Discontinued     4.5 g 200 mL/hr over 30 Minutes Intravenous  Once 05/16/16 1357 05/16/16 1435        Objective:   Vitals:   05/30/16 2130 05/31/16 0551 05/31/16 0944 05/31/16 1317  BP: (!) 156/80 (!) 148/60 (!) 174/64 (!) 177/57  Pulse: 61 62 61 68  Resp: 16 16  16   Temp:  98.2 F (36.8 C) 98.2 F (36.8 C)  98 F (36.7 C)  TempSrc: Oral Oral  Oral  SpO2: 100% 100% 96% 98%  Weight:      Height:        Wt Readings from Last 3 Encounters:  05/16/16 108.2 kg (238 lb 8.6 oz)  03/13/16 112.2 kg (247 lb 6.4 oz)  10/22/15 113.2 kg (249 lb 8 oz)     Intake/Output Summary (Last 24 hours) at 05/31/16 1616 Last data filed at 05/31/16 1318  Gross per 24 hour  Intake             1034 ml  Output             4485 ml  Net            -3451 ml     Physical Exam  Gen: not in distress HEENT: no pallor, moist mucosa, supple neck Chest: clear b/l, no added sounds CVS: N S1&S2, no murmurs, rubs or gallop GI: Soft, colostomy draining, bowel sounds present Musculoskeletal: warm, no edema CNS: AAOX3, non focal    Data Review:    CBC  Recent Labs Lab 05/25/16 0426 05/28/16 0416 05/29/16 0400 05/31/16 0320  WBC 16.9* 8.1 8.0 9.3  HGB 9.6* 8.5* 9.1* 8.9*  HCT 29.0* 27.2* 28.3* 26.9*  PLT 374 339 335 390  MCV 95.7 95.8 95.3 94.1  MCH 31.7 29.9 30.6 31.1  MCHC 33.1 31.3 32.2 33.1  RDW 14.1 13.9 13.6 13.8  LYMPHSABS 1.0  --  1.1  --   MONOABS 1.1*  --  0.8  --   EOSABS 0.0  --  0.2  --   BASOSABS 0.0  --  0.0  --     Chemistries   Recent Labs Lab 05/25/16 0426 05/26/16 0432 05/27/16 0500 05/28/16 0416 05/29/16 0400 05/31/16 0320  NA 137 138 138 138 136 137  K 3.7 3.3* 3.5 3.9 3.9 4.1  CL 104 104 105 102  100* 102  CO2 27 28 28 29 30 27   GLUCOSE 229* 159* 128* 169* 180* 191*  BUN 13 17 17 17 14 15   CREATININE 0.81 0.77 0.68 0.71 0.59* 0.67  CALCIUM 8.1* 8.1* 8.2* 8.3* 8.4* 8.6*  MG 1.8 1.9 1.9 2.0 2.1  --   AST 23 22  --   --  24  --   ALT 19 16*  --   --  21  --   ALKPHOS 50 51  --   --  81  --   BILITOT 0.6 0.5  --   --  0.9  --    ------------------------------------------------------------------------------------------------------------------  Recent Labs  05/29/16 0400  TRIG 222*    Lab Results  Component Value Date    HGBA1C 7.2 (H) 05/16/2016   ------------------------------------------------------------------------------------------------------------------ No results for input(s): TSH, T4TOTAL, T3FREE, THYROIDAB in the last 72 hours.  Invalid input(s): FREET3 ------------------------------------------------------------------------------------------------------------------ No results for input(s): VITAMINB12, FOLATE, FERRITIN, TIBC, IRON, RETICCTPCT in the last 72 hours.  Coagulation profile No results for input(s): INR, PROTIME in the last 168 hours.  No results for input(s): DDIMER in the last 72 hours.  Cardiac Enzymes No results for input(s): CKMB, TROPONINI, MYOGLOBIN in the last 168 hours.  Invalid input(s): CK ------------------------------------------------------------------------------------------------------------------ No results found for: BNP  Inpatient Medications  Scheduled Meds: . acetaminophen  1,000 mg Oral Q6H  . antiseptic oral rinse  7 mL Mouth Rinse q12n4p  . atenolol  50 mg Oral Daily  . brimonidine  1 drop Both Eyes BID  . chlorhexidine  15 mL Mouth Rinse BID  . enoxaparin (LOVENOX) injection  40 mg Subcutaneous Q24H  . famotidine  20 mg Oral QHS  . [START ON 06/01/2016] glimepiride  2 mg Oral Q breakfast  . insulin aspart  0-20 Units Subcutaneous Q4H  . insulin detemir  15 Units Subcutaneous BID  . latanoprost  1 drop Both Eyes QHS  . [START ON 06/01/2016] metFORMIN  1,000 mg Oral BID WC  . piperacillin-tazobactam (ZOSYN)  IV  3.375 g Intravenous Q8H   Continuous Infusions: . Marland KitchenTPN (CLINIMIX-E) Adult    . sodium chloride 10 mL/hr at 05/27/16 1800  . Marland KitchenTPN (CLINIMIX-E) Adult 45 mL/hr at 05/31/16 1215   And  . fat emulsion 240 mL (05/30/16 1804)  . fat emulsion     PRN Meds:.HYDROmorphone (DILAUDID) injection, metoprolol, oxyCODONE, phenol, sodium chloride flush, zolpidem  Micro Results Recent Results (from the past 240 hour(s))  Surgical pcr screen     Status:  Abnormal   Collection Time: 05/23/16  7:59 AM  Result Value Ref Range Status   MRSA, PCR POSITIVE (A) NEGATIVE Final    Comment: RESULT CALLED TO, READ BACK BY AND VERIFIED WITH: T COBLE RN AT 1055 ON 08.01.17 BY SHUEA    Staphylococcus aureus POSITIVE (A) NEGATIVE Final    Comment:        The Xpert SA Assay (FDA approved for NASAL specimens in patients over 32 years of age), is one component of a comprehensive surveillance program.  Test performance has been validated by [facility] for patients greater than or equal to 25 year old. It is not intended to diagnose infection nor to guide or monitor treatment.     Radiology Reports Ct Abdomen Pelvis W Contrast  Result Date: 05/21/2016 CLINICAL DATA:  70 year old male -follow-up abdominal/pelvic abscesses. EXAM: CT ABDOMEN AND PELVIS WITH CONTRAST TECHNIQUE: Multidetector CT imaging of the abdomen and pelvis was performed using the standard protocol following bolus administration  of intravenous contrast. CONTRAST:  125mL ISOVUE-300 IOPAMIDOL (ISOVUE-300) INJECTION 61% COMPARISON:  05/16/2016 CT FINDINGS: Lower chest: Trace bilateral pleural effusions and bibasilar atelectasis are now noted. Hepatobiliary: The liver is unchanged. Cholelithiasis noted without CT evidence of acute cholecystitis. No biliary dilatation identified. Pancreas: Unremarkable Spleen: Unremarkable Adrenals/Urinary Tract: The 2 x 4 cm left adrenal adenoma is unchanged. The kidneys, right adrenal gland and bladder are unremarkable. Stomach/Bowel: The 5 cm collection/abscess within the central pelvis now primarily contains gas. Better defined focal fluid collections within the mesentery, right pericolic gutter and pelvis are identified as follows: A 1.9 x 2.8 cm right mesenteric collection (image 64). A 1.5 x 2.3 cm central pelvic mesenteric collection (image 65) A 2 x 4 cm right paracolic gutter collection (image 66) A 2.9 x 3.7 cm collection within the left pelvis (image  70). A 2 x 4.4 cm collection in the low pelvis (image 84). A 2.5 x 2.8 cm ill-defined interloop collection (image 67). There is no evidence of pneumoperitoneum or bowel obstruction. Vascular/Lymphatic: Aortic atherosclerotic calcifications noted without aneurysm. Reproductive: Unremarkable Other: Small amount of ascites is noted. Musculoskeletal: No acute or suspicious abnormalities. IMPRESSION: Developing collections/abscesses within the mesentery, right paracolic gutter and pelvis as described. Dominant 5 cm abscess within the central pelvis now primarily contains gas. No evidence of pneumoperitoneum or bowel obstruction. New trace bilateral pleural effusions, bibasilar atelectasis and small amount of ascites. Cholelithiasis. Abdominal aortic atherosclerosis. Electronically Signed   By: Margarette Canada M.D.   On: 05/21/2016 15:13  Ct Abdomen Pelvis W Contrast  Result Date: 05/16/2016 CLINICAL DATA:  70 year old male with abdominal and pelvic pain and chills for 5 days. EXAM: CT ABDOMEN AND PELVIS WITH CONTRAST TECHNIQUE: Multidetector CT imaging of the abdomen and pelvis was performed using the standard protocol following bolus administration of intravenous contrast. CONTRAST:  132mL ISOVUE-300 IOPAMIDOL (ISOVUE-300) INJECTION 61% COMPARISON:  05/16/2016 and prior radiographs FINDINGS: Lower chest: Cardiomegaly and moderate to heavy coronary artery calcifications noted. Hepatobiliary: The liver is unremarkable. A 1.1 cm calcified gallstone is noted. There is no CT evidence of acute cholecystitis. No biliary dilatation identified. Pancreas: Unremarkable Spleen: Unremarkable Adrenals/Urinary Tract: Mild bilateral renal atrophy noted with small bilateral renal cysts. A 2 x 4 cm left adrenal mass has a relative washout of 40%, consistent with an adenoma. The right adrenal gland and bladder are unremarkable. Stomach/Bowel: Moderate inflammation within the mid pelvis is noted with a 5 cm ill-defined  collection/developing abscess (image 65). A tiny focus of extraluminal gas is noted (image 62). This inflammation and collection lies immediately adjacent to small bowel and a portion of the sigmoid colon. Colonic diverticulosis is noted. There is upper limits of normal caliber proximal small bowel likely representing ileus. Vascular/Lymphatic: Aortic atherosclerotic calcifications noted without aneurysm. No enlarged lymph nodes identified. Reproductive: Unremarkable Other: No free fluid noted. Musculoskeletal: No acute or suspicious abnormality. IMPRESSION: 5 cm collection/ developing abscess within the mid pelvis with adjacent inflammation and tiny focus of intraluminal gas. This compatible with focal bowel perforation, but difficult to determine origin from small bowel or the sigmoid colon (more likely). Small amount of free fluid within the abdomen and pelvis. Cholelithiasis without CT evidence of acute cholecystitis. Left adrenal adenoma. Abdominal aortic atherosclerosis. These results were discussed with Dr. Thurnell Garbe on 05/16/2016 at 1:50 p.m. Electronically Signed   By: Margarette Canada M.D.   On: 05/16/2016 13:52  Dg Abd Acute W/chest  Result Date: 05/16/2016 CLINICAL DATA:  Constipation with lower abdominal pain EXAM: DG  ABDOMEN ACUTE W/ 1V CHEST COMPARISON:  Chest radiograph October 11, 2011 FINDINGS: PA chest: Lungs are clear. Heart size and pulmonary vascularity are normal. No adenopathy. There are foci of atherosclerotic calcification in each carotid artery. Supine and upright abdomen: There is moderate stool throughout colon. There is no appreciable bowel dilatation. There are scattered air-fluid levels in the abdomen, however. No free air is evident. There is a small phlebolith in the pelvis. IMPRESSION: Scattered air-fluid levels without bowel dilatation. Question early ileus or enteritis. Bowel obstruction is felt to be less likely. No free air. Moderate stool throughout colon. No lung edema or  consolidation. There are foci of carotid artery calcification bilaterally. Electronically Signed   By: Lowella Grip III M.D.   On: 05/16/2016 12:44   Time Spent in minutes  25   Louellen Molder M.D on 05/31/2016 at 4:16 PM  Between 7am to 7pm - Pager - [phone]  After 7pm go to [email or web address] - password [facility]  [facility] -  Office  [phone]

## 2016-06-01 DIAGNOSIS — E118 Type 2 diabetes mellitus with unspecified complications: Secondary | ICD-10-CM | POA: Diagnosis not present

## 2016-06-01 DIAGNOSIS — A419 Sepsis, unspecified organism: Secondary | ICD-10-CM | POA: Diagnosis not present

## 2016-06-01 DIAGNOSIS — E1165 Type 2 diabetes mellitus with hyperglycemia: Secondary | ICD-10-CM | POA: Diagnosis not present

## 2016-06-01 DIAGNOSIS — G4733 Obstructive sleep apnea (adult) (pediatric): Secondary | ICD-10-CM | POA: Diagnosis not present

## 2016-06-01 DIAGNOSIS — K5720 Diverticulitis of large intestine with perforation and abscess without bleeding: Secondary | ICD-10-CM | POA: Diagnosis not present

## 2016-06-01 DIAGNOSIS — I10 Essential (primary) hypertension: Secondary | ICD-10-CM | POA: Diagnosis not present

## 2016-06-01 DIAGNOSIS — T8189XA Other complications of procedures, not elsewhere classified, initial encounter: Secondary | ICD-10-CM | POA: Diagnosis not present

## 2016-06-01 LAB — COMPREHENSIVE METABOLIC PANEL
ALT: 26 U/L (ref 17–63)
AST: 22 U/L (ref 15–41)
Albumin: 2.2 g/dL — ABNORMAL LOW (ref 3.5–5.0)
Alkaline Phosphatase: 90 U/L (ref 38–126)
Anion gap: 5 (ref 5–15)
BUN: 15 mg/dL (ref 6–20)
CHLORIDE: 105 mmol/L (ref 101–111)
CO2: 28 mmol/L (ref 22–32)
Calcium: 8.5 mg/dL — ABNORMAL LOW (ref 8.9–10.3)
Creatinine, Ser: 0.83 mg/dL (ref 0.61–1.24)
Glucose, Bld: 153 mg/dL — ABNORMAL HIGH (ref 65–99)
POTASSIUM: 3.7 mmol/L (ref 3.5–5.1)
Sodium: 138 mmol/L (ref 135–145)
Total Bilirubin: 0.6 mg/dL (ref 0.3–1.2)
Total Protein: 5.7 g/dL — ABNORMAL LOW (ref 6.5–8.1)

## 2016-06-01 LAB — PHOSPHORUS: PHOSPHORUS: 3.5 mg/dL (ref 2.5–4.6)

## 2016-06-01 LAB — GLUCOSE, CAPILLARY
GLUCOSE-CAPILLARY: 155 mg/dL — AB (ref 65–99)
GLUCOSE-CAPILLARY: 111 mg/dL — AB (ref 65–99)
Glucose-Capillary: 157 mg/dL — ABNORMAL HIGH (ref 65–99)
Glucose-Capillary: 128 mg/dL — ABNORMAL HIGH (ref 65–99)
Glucose-Capillary: 78 mg/dL (ref 65–99)

## 2016-06-01 LAB — MAGNESIUM: MAGNESIUM: 1.9 mg/dL (ref 1.7–2.4)

## 2016-06-01 MED ORDER — ASPIRIN 81 MG PO CHEW
81.0000 mg | CHEWABLE_TABLET | Freq: Every day | ORAL | Status: DC
  Administered 2016-06-01 – 2016-06-02 (×2): 81 mg via ORAL
  Filled 2016-06-01 (×2): qty 1

## 2016-06-01 MED ORDER — INSULIN ASPART 100 UNIT/ML ~~LOC~~ SOLN
0.0000 [IU] | Freq: Three times a day (TID) | SUBCUTANEOUS | Status: DC
  Administered 2016-06-01: 3 [IU] via SUBCUTANEOUS

## 2016-06-01 MED ORDER — CLOPIDOGREL BISULFATE 75 MG PO TABS
75.0000 mg | ORAL_TABLET | Freq: Every day | ORAL | Status: DC
  Administered 2016-06-01 – 2016-06-02 (×2): 75 mg via ORAL
  Filled 2016-06-01 (×2): qty 1

## 2016-06-01 NOTE — Progress Notes (Signed)
PROGRESS NOTE                                                                                                                                                                                                             Patient Demographics:    Evan Avery, is a 70 y.o. male, DOB - 1945/11/23, LK:[id number]  Admit date - 05/16/2016   Admitting Physician [name], MD  Outpatient Primary MD for the patient is [name], MD  LOS - 16  Outpatient Specialists:none  Chief Complaint  Patient presents with  . Abdominal Pain  . Constipation       Brief Narrative  70 year old obese male with history of coronary artery disease status post stent, presented with abdominal pain. CT abdomen and pelvis showed 5 cm the abscess in the pelvis likely secondary to sigmoid diverticular perforation. Surgery was consulted who initially recommended IR guided percutaneous drain placement but was unsuccessful. 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 Patient developed postop ileus which is now improving.     Subjective:   Abdominal discomfort resolved. Refusing Lasix due to frequent urination.   Assessment  & Plan :   Principal problem Sepsis ([place]) due to Colonic diverticular abscess due to Diverticulitis of colon with perforation:. Exploratory laparotomy and drainage of multiple intra-abdominal abscess with sigmoid colectomy and a colostomy on 8/1. PICC line Placed on 8/2. Continue IV Zosyn..  -Advance diet. Weaning off TNA. Nutrition consulted. When necessary pain medications. -Plan on discharging home tomorrow with home health   Fluid overload Significant positive balance. Given IV Lasix with good diuresis. Refused further Lasix.   Diabetes mellitus type 2, uncontrolled, with complications (HCC) on Levemir 15 units  subcutaneous twice a day with sliding scale coverage. Will increase Levemir dose. Hold metformin.   CAD with stents: RCA stent in 2009.. Aspirin and Plavix resumed by surgery today. Continue atenolol.   Essential hypertension: Resume atenolol continue to hold ACE inhibitor.  OSA on CPAP:   Obesity, Class II, BMI 35-39.9, with comorbidity (HCC)   DVT prophylaxis: lovenox Family Communication: Brother at bedside  Disposition: home tomorrow Code Status: Full code    Code Status : Full code  Family Communication  : Brother at bedside  Disposition Plan  : Home versus SNF. PT evaluation pending.  Barriers For Discharge : Improving symptoms.  Consults  :   [facility] surgery  Procedures  :  Laparotomy with drainage of multiple intra-abdominal abscesses, sigmoid colectomy with colostomy.  DVT Prophylaxis  :  Lovenox -  Lab Results  Component Value Date   PLT 390 05/31/2016    Antibiotics  :   Anti-infectives    Start     Dose/Rate Route Frequency Ordered Stop   05/23/16 1530  piperacillin-tazobactam (ZOSYN) IVPB 3.375 g  Status:  Discontinued     3.375 g 12.5 mL/hr over 240 Minutes Intravenous Every 8 hours 05/23/16 1435 06/01/16 0822   05/16/16 2200  piperacillin-tazobactam (ZOSYN) IVPB 3.375 g  Status:  Discontinued     3.375 g 12.5 mL/hr over 240 Minutes Intravenous Every 8 hours 05/16/16 1710 05/23/16 2152   05/16/16 1500  piperacillin-tazobactam (ZOSYN) IVPB 3.375 g     3.375 g 100 mL/hr over 30 Minutes Intravenous  Once 05/16/16 1435 05/16/16 1613   05/16/16 1430  piperacillin-tazobactam (ZOSYN) IVPB 4.5 g  Status:  Discontinued     4.5 g 200 mL/hr over 30 Minutes Intravenous  Once 05/16/16 1357 05/16/16 1435        Objective:   Vitals:   05/31/16 0944 05/31/16 1317 05/31/16 1945 05/31/16 2224  BP: (!) 174/64 (!) 177/57  (!) 165/74  Pulse: 61 68 70 (!) 50  Resp:  16 16 18   Temp:  98 F (36.7 C)  97.9 F (36.6 C)  TempSrc:  Oral  Oral  SpO2:  96% 98% 98% 100%  Weight:      Height:        Wt Readings from Last 3 Encounters:  05/16/16 108.2 kg (238 lb 8.6 oz)  03/13/16 112.2 kg (247 lb 6.4 oz)  10/22/15 113.2 kg (249 lb 8 oz)     Intake/Output Summary (Last 24 hours) at 06/01/16 1338 Last data filed at 06/01/16 1218  Gross per 24 hour  Intake           910.33 ml  Output              900 ml  Net            10.33 ml     Physical Exam  Gen: not in distress HEENT:  moist mucosa, supple neck Chest: clear b/l, no added sounds CVS: N S1&S2, no murmurs, rubs or gallop GI: Soft, colostomy draining, bowel sounds present Musculoskeletal: warm, Trace edema    Data Review:    CBC  Recent Labs Lab 05/28/16 0416 05/29/16 0400 05/31/16 0320  WBC 8.1 8.0 9.3  HGB 8.5* 9.1* 8.9*  HCT 27.2* 28.3* 26.9*  PLT 339 335 390  MCV 95.8 95.3 94.1  MCH 29.9 30.6 31.1  MCHC 31.3 32.2 33.1  RDW 13.9 13.6 13.8  LYMPHSABS  --  1.1  --   MONOABS  --  0.8  --   EOSABS  --  0.2  --   BASOSABS  --  0.0  --     Chemistries   Recent Labs Lab 05/26/16 0432 05/27/16 0500 05/28/16 0416 05/29/16 0400 05/31/16 0320 06/01/16 0455  NA 138 138 138 136 137 138  K 3.3* 3.5 3.9 3.9 4.1 3.7  CL 104 105 102 100* 102 105  CO2 28 28 29 30 27 28   GLUCOSE 159* 128* 169* 180* 191* 153*  BUN 17 17 17 14 15 15   CREATININE 0.77 0.68 0.71 0.59* 0.67 0.83  CALCIUM 8.1* 8.2* 8.3* 8.4*  8.6* 8.5*  MG 1.9 1.9 2.0 2.1  --  1.9  AST 22  --   --  24  --  22  ALT 16*  --   --  21  --  26  ALKPHOS 51  --   --  81  --  90  BILITOT 0.5  --   --  0.9  --  0.6   ------------------------------------------------------------------------------------------------------------------ No results for input(s): CHOL, HDL, LDLCALC, TRIG, CHOLHDL, LDLDIRECT in the last 72 hours.  Lab Results  Component Value Date   HGBA1C 7.2 (H) 05/16/2016    ------------------------------------------------------------------------------------------------------------------ No results for input(s): TSH, T4TOTAL, T3FREE, THYROIDAB in the last 72 hours.  Invalid input(s): FREET3 ------------------------------------------------------------------------------------------------------------------ No results for input(s): VITAMINB12, FOLATE, FERRITIN, TIBC, IRON, RETICCTPCT in the last 72 hours.  Coagulation profile No results for input(s): INR, PROTIME in the last 168 hours.  No results for input(s): DDIMER in the last 72 hours.  Cardiac Enzymes No results for input(s): CKMB, TROPONINI, MYOGLOBIN in the last 168 hours.  Invalid input(s): CK ------------------------------------------------------------------------------------------------------------------ No results found for: BNP  Inpatient Medications  Scheduled Meds: . acetaminophen  1,000 mg Oral Q6H  . antiseptic oral rinse  7 mL Mouth Rinse q12n4p  . aspirin  81 mg Oral Daily  . atenolol  50 mg Oral Daily  . brimonidine  1 drop Both Eyes BID  . chlorhexidine  15 mL Mouth Rinse BID  . clopidogrel  75 mg Oral Daily  . enoxaparin (LOVENOX) injection  40 mg Subcutaneous Q24H  . famotidine  20 mg Oral QHS  . glimepiride  2 mg Oral Q breakfast  . insulin aspart  0-20 Units Subcutaneous TID WC  . insulin detemir  20 Units Subcutaneous BID  . latanoprost  1 drop Both Eyes QHS   Continuous Infusions: . sodium chloride 10 mL/hr at 05/27/16 1800   PRN Meds:.HYDROmorphone (DILAUDID) injection, oxyCODONE, phenol, sodium chloride flush  Micro Results Recent Results (from the past 240 hour(s))  Surgical pcr screen     Status: Abnormal   Collection Time: 05/23/16  7:59 AM  Result Value Ref Range Status   MRSA, PCR POSITIVE (A) NEGATIVE Final    Comment: RESULT CALLED TO, READ BACK BY AND VERIFIED WITH: T COBLE RN AT 1055 ON 08.01.17 BY SHUEA    Staphylococcus aureus POSITIVE (A) NEGATIVE  Final    Comment:        The Xpert SA Assay (FDA approved for NASAL specimens in patients over 26 years of age), is one component of a comprehensive surveillance program.  Test performance has been validated by [facility] for patients greater than or equal to 83 year old. It is not intended to diagnose infection nor to guide or monitor treatment.     Radiology Reports Ct Abdomen Pelvis W Contrast  Result Date: 05/21/2016 CLINICAL DATA:  70 year old male -follow-up abdominal/pelvic abscesses. EXAM: CT ABDOMEN AND PELVIS WITH CONTRAST TECHNIQUE: Multidetector CT imaging of the abdomen and pelvis was performed using the standard protocol following bolus administration of intravenous contrast. CONTRAST:  122mL ISOVUE-300 IOPAMIDOL (ISOVUE-300) INJECTION 61% COMPARISON:  05/16/2016 CT FINDINGS: Lower chest: Trace bilateral pleural effusions and bibasilar atelectasis are now noted. Hepatobiliary: The liver is unchanged. Cholelithiasis noted without CT evidence of acute cholecystitis. No biliary dilatation identified. Pancreas: Unremarkable Spleen: Unremarkable Adrenals/Urinary Tract: The 2 x 4 cm left adrenal adenoma is unchanged. The kidneys, right adrenal gland and bladder are unremarkable. Stomach/Bowel: The 5 cm collection/abscess within the central pelvis now primarily  contains gas. Better defined focal fluid collections within the mesentery, right pericolic gutter and pelvis are identified as follows: A 1.9 x 2.8 cm right mesenteric collection (image 64). A 1.5 x 2.3 cm central pelvic mesenteric collection (image 65) A 2 x 4 cm right paracolic gutter collection (image 66) A 2.9 x 3.7 cm collection within the left pelvis (image 70). A 2 x 4.4 cm collection in the low pelvis (image 84). A 2.5 x 2.8 cm ill-defined interloop collection (image 67). There is no evidence of pneumoperitoneum or bowel obstruction. Vascular/Lymphatic: Aortic atherosclerotic calcifications noted without aneurysm.  Reproductive: Unremarkable Other: Small amount of ascites is noted. Musculoskeletal: No acute or suspicious abnormalities. IMPRESSION: Developing collections/abscesses within the mesentery, right paracolic gutter and pelvis as described. Dominant 5 cm abscess within the central pelvis now primarily contains gas. No evidence of pneumoperitoneum or bowel obstruction. New trace bilateral pleural effusions, bibasilar atelectasis and small amount of ascites. Cholelithiasis. Abdominal aortic atherosclerosis. Electronically Signed   By: Margarette Canada M.D.   On: 05/21/2016 15:13  Ct Abdomen Pelvis W Contrast  Result Date: 05/16/2016 CLINICAL DATA:  70 year old male with abdominal and pelvic pain and chills for 5 days. EXAM: CT ABDOMEN AND PELVIS WITH CONTRAST TECHNIQUE: Multidetector CT imaging of the abdomen and pelvis was performed using the standard protocol following bolus administration of intravenous contrast. CONTRAST:  119mL ISOVUE-300 IOPAMIDOL (ISOVUE-300) INJECTION 61% COMPARISON:  05/16/2016 and prior radiographs FINDINGS: Lower chest: Cardiomegaly and moderate to heavy coronary artery calcifications noted. Hepatobiliary: The liver is unremarkable. A 1.1 cm calcified gallstone is noted. There is no CT evidence of acute cholecystitis. No biliary dilatation identified. Pancreas: Unremarkable Spleen: Unremarkable Adrenals/Urinary Tract: Mild bilateral renal atrophy noted with small bilateral renal cysts. A 2 x 4 cm left adrenal mass has a relative washout of 40%, consistent with an adenoma. The right adrenal gland and bladder are unremarkable. Stomach/Bowel: Moderate inflammation within the mid pelvis is noted with a 5 cm ill-defined collection/developing abscess (image 65). A tiny focus of extraluminal gas is noted (image 62). This inflammation and collection lies immediately adjacent to small bowel and a portion of the sigmoid colon. Colonic diverticulosis is noted. There is upper limits of normal caliber  proximal small bowel likely representing ileus. Vascular/Lymphatic: Aortic atherosclerotic calcifications noted without aneurysm. No enlarged lymph nodes identified. Reproductive: Unremarkable Other: No free fluid noted. Musculoskeletal: No acute or suspicious abnormality. IMPRESSION: 5 cm collection/ developing abscess within the mid pelvis with adjacent inflammation and tiny focus of intraluminal gas. This compatible with focal bowel perforation, but difficult to determine origin from small bowel or the sigmoid colon (more likely). Small amount of free fluid within the abdomen and pelvis. Cholelithiasis without CT evidence of acute cholecystitis. Left adrenal adenoma. Abdominal aortic atherosclerosis. These results were discussed with Dr. Thurnell Garbe on 05/16/2016 at 1:50 p.m. Electronically Signed   By: Margarette Canada M.D.   On: 05/16/2016 13:52  Dg Abd Acute W/chest  Result Date: 05/16/2016 CLINICAL DATA:  Constipation with lower abdominal pain EXAM: DG ABDOMEN ACUTE W/ 1V CHEST COMPARISON:  Chest radiograph October 11, 2011 FINDINGS: PA chest: Lungs are clear. Heart size and pulmonary vascularity are normal. No adenopathy. There are foci of atherosclerotic calcification in each carotid artery. Supine and upright abdomen: There is moderate stool throughout colon. There is no appreciable bowel dilatation. There are scattered air-fluid levels in the abdomen, however. No free air is evident. There is a small phlebolith in the pelvis. IMPRESSION: Scattered air-fluid levels without bowel  dilatation. Question early ileus or enteritis. Bowel obstruction is felt to be less likely. No free air. Moderate stool throughout colon. No lung edema or consolidation. There are foci of carotid artery calcification bilaterally. Electronically Signed   By: Lowella Grip III M.D.   On: 05/16/2016 12:44   Time Spent in minutes  25   Louellen Molder M.D on 06/01/2016 at 1:38 PM  Between 7am to 7pm - Pager -  [phone]  After 7pm go to [email or web address] - password [facility]  [facility] -  Office  [phone]

## 2016-06-01 NOTE — Progress Notes (Signed)
PARENTERAL NUTRITION CONSULT NOTE - Follow Up  Pharmacy Consult for TPN Indication: Perforated diverticulitis with abscesses, now s/p abscess drainage, sigmoid colectomy, end colostomy  Allergies  Allergen Reactions  . Niacin And Related Other (See Comments)    Extreme fatigue    Patient Measurements: Height: 5\' 8"  (172.7 cm) Weight: 238 lb 8.6 oz (108.2 kg) IBW/kg (Calculated) : 68.4 Adjusted Body Weight: 100 kg Usual Weight: 113 kg on admit 05/16/16  Vital Signs: Temp: 97.9 F (36.6 C) (08/09 2224) Temp Source: Oral (08/09 2224) BP: 165/74 (08/09 2224) Pulse Rate: 50 (08/09 2224) Intake/Output from previous day: 08/09 0701 - 08/10 0700 In: 670.3 [P.O.:120; I.V.:500.3; IV Piggyback:50] Out: 1115 [Urine:1000; Drains:15; Stool:100] Intake/Output from this shift: No intake/output data recorded.  Labs:  Recent Labs  05/31/16 0320  WBC 9.3  HGB 8.9*  HCT 26.9*  PLT 390     Recent Labs  05/31/16 0320 06/01/16 0455  NA 137 138  K 4.1 3.7  CL 102 105  CO2 27 28  GLUCOSE 191* 153*  BUN 15 15  CREATININE 0.67 0.83  CALCIUM 8.6* 8.5*  MG  --  1.9  PHOS  --  3.5  PROT  --  5.7*  ALBUMIN  --  2.2*  AST  --  22  ALT  --  26  ALKPHOS  --  90  BILITOT  --  0.6  Corrected calcium: 10.1 Estimated Creatinine Clearance: 98.7 mL/min (by C-G formula based on SCr of 0.83 mg/dL).    Recent Labs  05/31/16 2340 06/01/16 0400 06/01/16 0744  GLUCAP 145* 157* 155*    Medical History: Past Medical History:  Diagnosis Date  . CAD (coronary artery disease)    4 stents in right artery  . Complex tear of medial meniscus of left knee as current injury 07/26/2012   [place]  . Diabetes mellitus   . Glaucoma 2006   on drops, followed by [place]  . Hyperlipidemia   . Hypertension   . Myocardial infarction ([place])    1991  . Sleep apnea    uses CPAP regularly    Medications:  Scheduled:  . acetaminophen  1,000 mg Oral Q6H  . antiseptic oral rinse  7 mL Mouth Rinse  q12n4p  . aspirin  81 mg Oral Daily  . atenolol  50 mg Oral Daily  . brimonidine  1 drop Both Eyes BID  . chlorhexidine  15 mL Mouth Rinse BID  . clopidogrel  75 mg Oral Daily  . enoxaparin (LOVENOX) injection  40 mg Subcutaneous Q24H  . famotidine  20 mg Oral QHS  . glimepiride  2 mg Oral Q breakfast  . insulin aspart  0-20 Units Subcutaneous TID WC  . insulin detemir  20 Units Subcutaneous BID  . latanoprost  1 drop Both Eyes QHS   Infusions:  . sodium chloride 10 mL/hr at 05/27/16 1800  . fat emulsion 240 mL (05/31/16 1806)   PRN: HYDROmorphone (DILAUDID) injection, oxyCODONE, phenol, sodium chloride flush  Insulin Requirements: 26 units Novolog SSI last 24hrs  + 20 units in TPN (increased 8/8) + Levemir 15 mg BID (increased from 12 units BID on 8/6)  Current Nutrition: CL diet; Clinimix-E 5/15 at 90 mL/hr, 20% Fat Emulsion at 10 mL/hr  IVF: NS at 10 ml/hr  Central access: PICC placed 8/2 TPN start date:  8/2  ASSESSMENT  HPI: 70 y/o with h/o CAD and 4 stents, OSA on CPAP, DM, HTN, HLD who presents for abdominal pain & constipation, was found to have multiple intra-abdominal abscesses due to perforated sigmoid diverticulitis, not suitable for percutaneous drainage.  Underwent exploratory laparotomy, drainage of multiple intra-abdominal abscesses, sigmoid colectomy, and end colostomy on 8/1.  TPN initiated 8/2.  Significant events:  8/3: switched to Clinimix with lytes 8/6: per notes, pt feeling much better, still with some ileus, cont IV abxs for now and keep NPO 8/7: clear liquid diet started 8/8: surgery hoping pt will tolerate diet and can be weaned off TPN soon  Today:   Glucose: CBGs 155, goal < 150.  Requiring substantial amounts of SSI in addition to insulin in TPN plus Levemir.  PTA Amaryl and metformin on hold.  Electrolytes: WNL   Renal: SCr stable, WNL; good  UOP  LFTs (8/7): all below ULN  TGs (8/7): chronically elevated but < 250 this admission and improving.  Prealbumin: low but improved  (4.6 -> 5.1 -> 8.2)  Pt tolerating diet  NUTRITIONAL GOALS                                                                                             RD recs: 1900-2100 kcal, 110-120g protein per day Clinimix-E 5/15 at a goal rate of 40ml/hr + 20% fat emulsion at 29ml/hr to provide: 108g/day protein, 2013Kcal/day.  PLAN                                                                                                                          Pt tolerating diet, TPN to stop per surgery  Discussed with RN and patient: will decrease Clinimix E 5/15 to 33mls/hour for 2 hours then stop TPN and fats at 1100  MD is managing dosing of Levemir.  change SSI to before meals   Discontinue TPN lab panels   Follow CBGs   Dolly Rias RPh 06/01/2016, 8:55 AM Pager [phone]

## 2016-06-01 NOTE — Progress Notes (Signed)
Nutrition Follow-up  DOCUMENTATION CODES:   Obesity unspecified  INTERVENTION:   TPN weaning today per Pharmacy Encourage PO intake RD to continue to monitor for additional needs  NUTRITION DIAGNOSIS:   Inadequate oral intake related to inability to eat as evidenced by NPO status.  Resolved.  GOAL:   Patient will meet greater than or equal to 90% of their needs  Meeting w/ TPN +diet  MONITOR:   PO intake, Labs, Weight trends, I & O's  ASSESSMENT:    70 y.o. male with medical history significant of CAD with a stent in 1992, obesity and OSA came to the hospital complaining about abdominal pain. His symptoms started about 10 days ago, he also had obstipation and tried multiple OTC laxatives without success. About 45 days ago started to have chills and he came in this morning because of the pain and chills worsens. CT scan showed intra-abdominal abscess.  TPN to be weaned and discontinued today. Pt's diet has been advanced to CHO modified diet. He is consuming 75% of meals.   Plan per Pharmacy 8/10:  Pt tolerating diet, TPN to stop per surgery  Discussed with RN and patient: will decrease Clinimix E 5/15 to 73mls/hour for 2 hours then stop TPN and fats at 1100  Medications reviewed. Labs reviewed: CBGs: 145-157  Diet Order:  Diet Carb Modified Fluid consistency: Thin; Room service appropriate? Yes  Skin:  Wound (see comment) (Abdominal incision from 05/23/16)  Last BM:  8/10 (colostomy)  Height:   Ht Readings from Last 1 Encounters:  05/16/16 5\' 8"  (1.727 m)    Weight:   Wt Readings from Last 1 Encounters:  05/16/16 238 lb 8.6 oz (108.2 kg)    Ideal Body Weight:  70 kg (kg)  BMI:  Body mass index is 36.27 kg/m.  Estimated Nutritional Needs:   Kcal:  1900-2100  Protein:  110-120 grams  Fluid:  >/= 2 L/day  EDUCATION NEEDS:   No education needs identified at this time  Clayton Bibles, MS, RD, LDN Pager: [phone] After Hours Pager: [phone]

## 2016-06-01 NOTE — Progress Notes (Signed)
Patient's son, Sagan Maione, and patient shown how to open the ostomy pouch, empty it, burp it, and close it.  Both participated fully in the teaching session.  Assurance was given that the home health agency nurses are very competent in continued teaching and support for ostomy care.  Both men verbalized their understanding and expressed their appreciation for the teaching provided.

## 2016-06-01 NOTE — Progress Notes (Signed)
9 Days Post-Op  Subjective: He looks good. Walk multiple times yesterday and slept well. Ostomy is working wound VAC is in place. Making slow progress. He says he is not been trained to use and care for his ostomy. We will work on that today.  Objective: Vital signs in last 24 hours: Temp:  [97.9 F (36.6 C)-98 F (36.7 C)] 97.9 F (36.6 C) (08/09 2224) Pulse Rate:  [50-70] 50 (08/09 2224) Resp:  [16-18] 18 (08/09 2224) BP: (165-177)/(57-74) 165/74 (08/09 2224) SpO2:  [96 %-100 %] 100 % (08/09 2224) Last BM Date: 05/23/16 120 PO recorded Urine 1000 Drain: 15 Stool: 100 recorded. Afebrile vital signs are stable Intake/Output from previous day: 08/09 0701 - 08/10 0700 In: 670.3 [P.O.:120; I.V.:500.3; IV Piggyback:50] Out: 1115 [Urine:1000; Drains:15; Stool:100] Intake/Output this shift: No intake/output data recorded.  General appearance: alert, cooperative and no distress Resp: clear to auscultation bilaterally GI: Soft, wound VAC in place, ostomy is working well, positive bowel sounds  Lab Results:   Recent Labs  05/31/16 0320  WBC 9.3  HGB 8.9*  HCT 26.9*  PLT 390    BMET  Recent Labs  05/31/16 0320 06/01/16 0455  NA 137 138  K 4.1 3.7  CL 102 105  CO2 27 28  GLUCOSE 191* 153*  BUN 15 15  CREATININE 0.67 0.83  CALCIUM 8.6* 8.5*   PT/INR No results for input(s): LABPROT, INR in the last 72 hours.   Recent Labs Lab 05/26/16 0432 05/29/16 0400 06/01/16 0455  AST 22 24 22   ALT 16* 21 26  ALKPHOS 51 81 90  BILITOT 0.5 0.9 0.6  PROT 5.2* 5.7* 5.7*  ALBUMIN 1.8* 2.1* 2.2*     Lipase     Component Value Date/Time   LIPASE 11 05/16/2016 1031     Studies/Results: No results found.  Medications: . acetaminophen  1,000 mg Oral Q6H  . antiseptic oral rinse  7 mL Mouth Rinse q12n4p  . atenolol  50 mg Oral Daily  . brimonidine  1 drop Both Eyes BID  . chlorhexidine  15 mL Mouth Rinse BID  . enoxaparin (LOVENOX) injection  40 mg Subcutaneous  Q24H  . famotidine  20 mg Oral QHS  . glimepiride  2 mg Oral Q breakfast  . insulin aspart  0-20 Units Subcutaneous Q4H  . insulin detemir  20 Units Subcutaneous BID  . latanoprost  1 drop Both Eyes QHS  . piperacillin-tazobactam (ZOSYN)  IV  3.375 g Intravenous Q8H    Assessment/Plan Perforated diverticulitis with multiple intra-abdominal abscesses                       Status post oratory laparotomy, drainage of multiple intra-abdominal abscesses sigmoid colectomy and colostomy, 05/23/16 Dr. Jackolyn Confer. POD 8  Malnutrition-pre-albumin 8.2 - 05/29/16 FEN:  Cardiac modified diet/TNA will be discontinued today. ID: Day 14 Zosyn - I have discontinued this today. DVT: Lovenox/SCD   Plan: We will look at the wound tomorrow make a decision on going home with wound VAC.  Ask home health to be set up and make sure he is training for use of his ostomy. There are no issues will plan discharge home tomorrow. I restarted his aspirin and Plavix today, other discharge meds per medicine recommendation. CBC in a.m.    LOS: 16 days    Evan Avery,Evan Avery 06/01/2016 [phone]  Agree with above. He has turned corner and is close to being ready to go home.  Alphonsa Overall, MD,  [facility] Pager: [phone] Office phone:  [phone]

## 2016-06-02 ENCOUNTER — Telehealth: Payer: Self-pay

## 2016-06-02 DIAGNOSIS — E1165 Type 2 diabetes mellitus with hyperglycemia: Secondary | ICD-10-CM | POA: Diagnosis not present

## 2016-06-02 DIAGNOSIS — E118 Type 2 diabetes mellitus with unspecified complications: Secondary | ICD-10-CM | POA: Diagnosis not present

## 2016-06-02 DIAGNOSIS — A419 Sepsis, unspecified organism: Secondary | ICD-10-CM | POA: Diagnosis not present

## 2016-06-02 DIAGNOSIS — I10 Essential (primary) hypertension: Secondary | ICD-10-CM | POA: Diagnosis not present

## 2016-06-02 DIAGNOSIS — K5720 Diverticulitis of large intestine with perforation and abscess without bleeding: Secondary | ICD-10-CM | POA: Diagnosis not present

## 2016-06-02 LAB — CBC
HEMATOCRIT: 28 % — AB (ref 39.0–52.0)
HEMOGLOBIN: 9.1 g/dL — AB (ref 13.0–17.0)
MCH: 30.7 pg (ref 26.0–34.0)
MCHC: 32.5 g/dL (ref 30.0–36.0)
MCV: 94.6 fL (ref 78.0–100.0)
Platelets: 372 10*3/uL (ref 150–400)
RBC: 2.96 MIL/uL — ABNORMAL LOW (ref 4.22–5.81)
RDW: 14.2 % (ref 11.5–15.5)
WBC: 7.7 10*3/uL (ref 4.0–10.5)

## 2016-06-02 LAB — GLUCOSE, CAPILLARY
GLUCOSE-CAPILLARY: 76 mg/dL (ref 65–99)
Glucose-Capillary: 67 mg/dL (ref 65–99)
Glucose-Capillary: 86 mg/dL (ref 65–99)

## 2016-06-02 MED ORDER — FUROSEMIDE 20 MG PO TABS: 20.0000 mg | ORAL_TABLET | Freq: Every day | ORAL | 0 refills | Status: DC | PRN

## 2016-06-02 MED ORDER — SENNOSIDES-DOCUSATE SODIUM 8.6-50 MG PO TABS: 1.0000 | ORAL_TABLET | Freq: Every evening | ORAL | 0 refills | Status: AC | PRN

## 2016-06-02 MED ORDER — HEPARIN SOD (PORK) LOCK FLUSH 100 UNIT/ML IV SOLN: 500.0000 [IU] | INTRAVENOUS | Status: DC | PRN

## 2016-06-02 MED ORDER — OXYCODONE HCL 10 MG PO TABS: 10.0000 mg | ORAL_TABLET | ORAL | 0 refills | Status: DC | PRN

## 2016-06-02 NOTE — Progress Notes (Signed)
Hypoglycemic Event  CBG: 67  Treatment: Patient just received tray. Is eating breakfast  Symptoms: Asymptomatic  Follow-up CBG: Time:0810 CBG Result:76  Possible Reasons for Event:   Comments/MD notified:    Atina Feeley, Manuela Schwartz

## 2016-06-02 NOTE — Discharge Summary (Signed)
Physician Discharge Summary  Evan Avery [id number] DOB: Mar 30, 1946 DOA: 05/16/2016  PCP: [name], MD  Admit date: 05/16/2016 Discharge date: 06/02/2016  Admitted From: Home Disposition:  Home with home health  Recommendations for Outpatient Follow-up:  1. Follow up with PCP in 1-2 weeks 2. Outpatient follow-up. Dr. [name] on 06/15/2016 at 2 pm  Home Health: RN Equipment/Devices: Colostomy/wound VAC  Discharge Condition: Stable CODE STATUS: Full code Diet recommendation: Diabetic/heart healthy   Discharge Diagnoses:   Principal problem Sepsis ([place])   Active Problems:     Diverticulitis of colon with perforation   Colonic diverticular abscess   Hyperlipidemia with target LDL less than 70   Essential hypertension   Obesity, Class II, BMI 35-39.9, with comorbidity ([place])   CAD in native artery     Diabetes mellitus type 2, uncontrolled, with complications (HCC)   OSA on CPAP   Brief narrative/history of present illness Please refer to admission H&P for details, in brief 70 year old obese male with history of coronary artery disease status post stent, presented with abdominal pain. CT abdomen and pelvis showed 5 cm the abscess in the pelvis likely secondary to sigmoid diverticular perforation. Surgery was consulted who initially recommended IR guided percutaneous drain placement but was unsuccessful. 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 Patient developed postop ileus which is now improving.  Hospital course Principal problem Sepsis [facility]) due to Colonic diverticular abscess due to Diverticulitis of colon with perforation:. Exploratory laparotomy and drainage of multiple intra-abdominal abscess with sigmoid colectomy and a colostomy on 8/1. PICC line Placed on 8/2. Received a course of IV Zosyn. Tolerating advanced diet. Patient weaned off TNA and  discontinued antibiotics. PICC line removed prior to discharge. Warm care evaluated the patient and provided teaching on reapplying wound. Home health arranged for colostomy and wound vac care. Short course of pain medications prescribed. He has follow-up with Dr. [name] in 2 weeks  Fluid overload Significant positive balance. Given IV Lasix with good diuresis. . I have prescribed him some Lasix upon discharge if he has further leg swellings.  Diabetes mellitus type 2, uncontrolled, with complications ([place]) Required Levemir while in the hospital. CBGs stable. Resume metformin and Amaryl upon discharge.  CAD with stents: RCA stent in 2009.. Aspirin and Plavix resumed by surgery today. Continue atenolol.   Essential hypertension: Resume home medications  OSA on CPAP:   Obesity, Class II, BMI 35-39.9, with comorbidity ([place])     Family Communication  :  wife and son at bedside  Disposition Plan  : Home with home health  Consults  :   [place] surgery  Procedures  :  Laparotomy with drainage of multiple intra-abdominal abscesses, sigmoid colectomy with colostomy.  Discharge Instructions  Discharge Instructions    AMB Referral to [name] [facility]    Complete by:  As directed   Please assign to [place] for transition of care for [facility] member. Written consent obtained. History of DM, HTN as well. S/p exploratory laparotomy, small bowel resection, drainage of multiple intra abdominal abscess, end colostomy. Will have [name] as well. Currently at [facility]. Please call with questions. [name], [name], [facility] Liaison-[phone]     Reason for consult:  Please assign to [facility] RNCM   Diagnoses of:  Diabetes   Expected date of contact:  1-3 days (reserved for hospital discharges)       Medication List  TAKE these medications   aspirin 81 MG tablet Take 81 mg by mouth  daily.   atenolol 50 MG tablet Commonly known as:  TENORMIN TAKE ONE & ONE-HALF TABLETS BY MOUTH ONCE DAILY What changed:  See the new instructions.   atorvastatin 40 MG tablet Commonly known as:  LIPITOR TAKE ONE-HALF TABLET BY MOUTH ONCE DAILY What changed:  See the new instructions.   brimonidine 0.2 % ophthalmic solution Commonly known as:  ALPHAGAN Place 1 drop into both eyes 2 (two) times daily.   clopidogrel 75 MG tablet Commonly known as:  PLAVIX TAKE ONE TABLET BY MOUTH ONCE DAILY   Coenzyme Q10 200 MG capsule Take 200 mg by mouth daily.   fenofibrate 160 MG tablet Take 1 tablet (160 mg total) by mouth daily.   furosemide 20 MG tablet Commonly known as:  LASIX Take 1 tablet (20 mg total) by mouth daily as needed for fluid or edema.   glimepiride 2 MG tablet Commonly known as:  AMARYL Take 1 tablet (2 mg total) by mouth daily with breakfast.   Glucosamine-Chondroitin-Vit D3 1500-1200-800 MG-MG-UNIT Pack Take 1 tablet by mouth daily.   latanoprost 0.005 % ophthalmic solution Commonly known as:  XALATAN Place 1 drop into both eyes at bedtime.   lisinopril 2.5 MG tablet Commonly known as:  PRINIVIL,ZESTRIL Take 1 tablet (2.5 mg total) by mouth daily.   metFORMIN 1000 MG tablet Commonly known as:  GLUCOPHAGE TAKE ONE TABLET BY MOUTH TWICE DAILY WITH MEALS   multivitamin tablet Take 1 tablet by mouth daily.   omega-3 acid ethyl esters 1 g capsule Commonly known as:  LOVAZA TAKE TWO CAPSULES BY MOUTH TWICE DAILY   Oxycodone HCl 10 MG Tabs Take 1 tablet (10 mg total) by mouth every 4 (four) hours as needed for moderate pain.   senna-docusate 8.6-50 MG tablet Commonly known as:  Senokot-S Take 1 tablet by mouth at bedtime as needed for mild constipation.   Turmeric 450 MG Caps Take 1 capsule by mouth daily.      Follow-up Information    [name], MD Follow up on 06/15/2016.   Specialty:  General Surgery Why:  Your appointment is at 2 PM,  be at the office 30 minutes early for check in. Contact information: [place] [id number] [phone]        [name], MD. Schedule an appointment as soon as possible for a visit in 1 week(s).   Specialty:  Family Medicine Contact information: [place] [address] [phone]          Allergies  Allergen Reactions  . Niacin And Related Other (See Comments)    Extreme fatigue       Procedures/Studies: Ct Abdomen Pelvis W Contrast  Result Date: 05/21/2016 CLINICAL DATA:  70 year old male -follow-up abdominal/pelvic abscesses. EXAM: CT ABDOMEN AND PELVIS WITH CONTRAST TECHNIQUE: Multidetector CT imaging of the abdomen and pelvis was performed using the standard protocol following bolus administration of intravenous contrast. CONTRAST:  151mL ISOVUE-300 IOPAMIDOL (ISOVUE-300) INJECTION 61% COMPARISON:  05/16/2016 CT FINDINGS: Lower chest: Trace bilateral pleural effusions and bibasilar atelectasis are now noted. Hepatobiliary: The liver is unchanged. Cholelithiasis noted without CT evidence of acute cholecystitis. No biliary dilatation identified. Pancreas: Unremarkable Spleen: Unremarkable Adrenals/Urinary Tract: The 2 x 4 cm left adrenal adenoma is unchanged. The kidneys, right adrenal gland and bladder are unremarkable. Stomach/Bowel: The 5 cm collection/abscess within the central pelvis now primarily contains gas. Better  defined focal fluid collections within the mesentery, right pericolic gutter and pelvis are identified as follows: A 1.9 x 2.8 cm right mesenteric collection (image 64). A 1.5 x 2.3 cm central pelvic mesenteric collection (image 65) A 2 x 4 cm right paracolic gutter collection (image 66) A 2.9 x 3.7 cm collection within the left pelvis (image 70). A 2 x 4.4 cm collection in the low pelvis (image 84). A 2.5 x 2.8 cm ill-defined interloop collection (image 67). There is no evidence of pneumoperitoneum or bowel  obstruction. Vascular/Lymphatic: Aortic atherosclerotic calcifications noted without aneurysm. Reproductive: Unremarkable Other: Small amount of ascites is noted. Musculoskeletal: No acute or suspicious abnormalities. IMPRESSION: Developing collections/abscesses within the mesentery, right paracolic gutter and pelvis as described. Dominant 5 cm abscess within the central pelvis now primarily contains gas. No evidence of pneumoperitoneum or bowel obstruction. New trace bilateral pleural effusions, bibasilar atelectasis and small amount of ascites. Cholelithiasis. Abdominal aortic atherosclerosis. Electronically Signed   By: Margarette Canada M.D.   On: 05/21/2016 15:13  Ct Abdomen Pelvis W Contrast  Result Date: 05/16/2016 CLINICAL DATA:  70 year old male with abdominal and pelvic pain and chills for 5 days. EXAM: CT ABDOMEN AND PELVIS WITH CONTRAST TECHNIQUE: Multidetector CT imaging of the abdomen and pelvis was performed using the standard protocol following bolus administration of intravenous contrast. CONTRAST:  160mL ISOVUE-300 IOPAMIDOL (ISOVUE-300) INJECTION 61% COMPARISON:  05/16/2016 and prior radiographs FINDINGS: Lower chest: Cardiomegaly and moderate to heavy coronary artery calcifications noted. Hepatobiliary: The liver is unremarkable. A 1.1 cm calcified gallstone is noted. There is no CT evidence of acute cholecystitis. No biliary dilatation identified. Pancreas: Unremarkable Spleen: Unremarkable Adrenals/Urinary Tract: Mild bilateral renal atrophy noted with small bilateral renal cysts. A 2 x 4 cm left adrenal mass has a relative washout of 40%, consistent with an adenoma. The right adrenal gland and bladder are unremarkable. Stomach/Bowel: Moderate inflammation within the mid pelvis is noted with a 5 cm ill-defined collection/developing abscess (image 65). A tiny focus of extraluminal gas is noted (image 62). This inflammation and collection lies immediately adjacent to small bowel and a portion of  the sigmoid colon. Colonic diverticulosis is noted. There is upper limits of normal caliber proximal small bowel likely representing ileus. Vascular/Lymphatic: Aortic atherosclerotic calcifications noted without aneurysm. No enlarged lymph nodes identified. Reproductive: Unremarkable Other: No free fluid noted. Musculoskeletal: No acute or suspicious abnormality. IMPRESSION: 5 cm collection/ developing abscess within the mid pelvis with adjacent inflammation and tiny focus of intraluminal gas. This compatible with focal bowel perforation, but difficult to determine origin from small bowel or the sigmoid colon (more likely). Small amount of free fluid within the abdomen and pelvis. Cholelithiasis without CT evidence of acute cholecystitis. Left adrenal adenoma. Abdominal aortic atherosclerosis. These results were discussed with Dr. Thurnell Garbe on 05/16/2016 at 1:50 p.m. Electronically Signed   By: Margarette Canada M.D.   On: 05/16/2016 13:52  Dg Abd Acute W/chest  Result Date: 05/16/2016 CLINICAL DATA:  Constipation with lower abdominal pain EXAM: DG ABDOMEN ACUTE W/ 1V CHEST COMPARISON:  Chest radiograph October 11, 2011 FINDINGS: PA chest: Lungs are clear. Heart size and pulmonary vascularity are normal. No adenopathy. There are foci of atherosclerotic calcification in each carotid artery. Supine and upright abdomen: There is moderate stool throughout colon. There is no appreciable bowel dilatation. There are scattered air-fluid levels in the abdomen, however. No free air is evident. There is a small phlebolith in the pelvis. IMPRESSION: Scattered air-fluid levels without bowel dilatation. Question early  ileus or enteritis. Bowel obstruction is felt to be less likely. No free air. Moderate stool throughout colon. No lung edema or consolidation. There are foci of carotid artery calcification bilaterally. Electronically Signed   By: Lowella Grip III M.D.   On: 05/16/2016 12:44      Subjective: Feels better  overall.  Discharge Exam: Vitals:   06/02/16 0548 06/02/16 0953  BP: (!) 172/69   Pulse: (!) 54 (!) 56  Resp: 18   Temp: 98.3 F (36.8 C)    Vitals:   06/01/16 2134 06/01/16 2143 06/02/16 0548 06/02/16 0953  BP:  (!) 149/67 (!) 172/69   Pulse: (!) 50 (!) 50 (!) 54 (!) 56  Resp: 18 18 18    Temp:  98.7 F (37.1 C) 98.3 F (36.8 C)   TempSrc:  Oral Axillary   SpO2: 99% 100% 97%   Weight:      Height:        Gen: not in distress HEENT:  moist mucosa, supple neck Chest: clear b/l, no added sounds CVS: N S1&S2, no murmurs, rubs or gallop GI: Soft, nondistended, bowel sounds present, colostomy in place, wound vac + Musculoskeletal: warm, Trace edema CNS: Alert and oriented   The results of significant diagnostics from this hospitalization (including imaging, microbiology, ancillary and laboratory) are listed below for reference.     Microbiology: No results found for this or any previous visit (from the past 240 hour(s)).   Labs: BNP (last 3 results) No results for input(s): BNP in the last 8760 hours. Basic Metabolic Panel:  Recent Labs Lab 05/27/16 0500 05/28/16 0416 05/29/16 0400 05/31/16 0320 06/01/16 0455  NA 138 138 136 137 138  K 3.5 3.9 3.9 4.1 3.7  CL 105 102 100* 102 105  CO2 28 29 30 27 28   GLUCOSE 128* 169* 180* 191* 153*  BUN 17 17 14 15 15   CREATININE 0.68 0.71 0.59* 0.67 0.83  CALCIUM 8.2* 8.3* 8.4* 8.6* 8.5*  MG 1.9 2.0 2.1  --  1.9  PHOS 3.5 3.5 3.4  --  3.5   Liver Function Tests:  Recent Labs Lab 05/29/16 0400 06/01/16 0455  AST 24 22  ALT 21 26  ALKPHOS 81 90  BILITOT 0.9 0.6  PROT 5.7* 5.7*  ALBUMIN 2.1* 2.2*   No results for input(s): LIPASE, AMYLASE in the last 168 hours. No results for input(s): AMMONIA in the last 168 hours. CBC:  Recent Labs Lab 05/28/16 0416 05/29/16 0400 05/31/16 0320 06/02/16 0510  WBC 8.1 8.0 9.3 7.7  NEUTROABS  --  5.9  --   --   HGB 8.5* 9.1* 8.9* 9.1*  HCT 27.2* 28.3* 26.9* 28.0*  MCV  95.8 95.3 94.1 94.6  PLT 339 335 390 372   Cardiac Enzymes: No results for input(s): CKTOTAL, CKMB, CKMBINDEX, TROPONINI in the last 168 hours. BNP: Invalid input(s): POCBNP CBG:  Recent Labs Lab 06/01/16 1159 06/01/16 1652 06/01/16 2142 06/02/16 0741 06/02/16 0810  GLUCAP 128* 78 111* 67 76   D-Dimer No results for input(s): DDIMER in the last 72 hours. Hgb A1c No results for input(s): HGBA1C in the last 72 hours. Lipid Profile No results for input(s): CHOL, HDL, LDLCALC, TRIG, CHOLHDL, LDLDIRECT in the last 72 hours. Thyroid function studies No results for input(s): TSH, T4TOTAL, T3FREE, THYROIDAB in the last 72 hours.  Invalid input(s): FREET3 Anemia work up No results for input(s): VITAMINB12, FOLATE, FERRITIN, TIBC, IRON, RETICCTPCT in the last 72 hours. Urinalysis    Component  Value Date/Time   COLORURINE ORANGE (A) 05/16/2016 1223   APPEARANCEUR CLOUDY (A) 05/16/2016 1223   LABSPEC 1.034 (H) 05/16/2016 1223   PHURINE 5.5 05/16/2016 1223   GLUCOSEU >1000 (A) 05/16/2016 1223   HGBUR NEGATIVE 05/16/2016 1223   HGBUR negative 05/17/2007 0835   BILIRUBINUR SMALL (A) 05/16/2016 1223   BILIRUBINUR n 02/28/2011   KETONESUR 15 (A) 05/16/2016 1223   PROTEINUR 30 (A) 05/16/2016 1223   UROBILINOGEN 1.0 10/11/2011 0215   NITRITE POSITIVE (A) 05/16/2016 1223   LEUKOCYTESUR SMALL (A) 05/16/2016 1223   Sepsis Labs Invalid input(s): PROCALCITONIN,  WBC,  LACTICIDVEN Microbiology No results found for this or any previous visit (from the past 240 hour(s)).   Time coordinating discharge: Over 30 minutes  SIGNED:   Louellen Molder, MD  [facility] 06/02/2016, 11:09 AM Pager   If 7PM-7AM, please contact night-coverage [email or web address] Password [email or web address]

## 2016-06-02 NOTE — Progress Notes (Signed)
10 Days Post-Op  Subjective: We took down the ostomy and the wound VAC today pictures are below. There is some sloughing of the medial portion of the ostomy.  Family is a bit overwhelmed with all the tasks required to take care of his wound and the ostomy.  He is tolerating diet well, ostomy is working properly. Medicine has adjusted his home meds and is pulling his PICC line. Wound care is in the room now to do teaching and reapply the home wound VAC.  Objective: Vital signs in last 24 hours: Temp:  [98.2 F (36.8 C)-98.7 F (37.1 C)] 98.3 F (36.8 C) (08/11 0548) Pulse Rate:  [50-66] 56 (08/11 0953) Resp:  [18] 18 (08/11 0548) BP: (141-172)/(57-69) 172/69 (08/11 0548) SpO2:  [95 %-100 %] 97 % (08/11 0548) Last BM Date: 06/01/16 (colostomy)  Intake/Output from previous day: 08/10 0701 - 08/11 0700 In: 820.7 [P.O.:605; I.V.:215.7] Out: 1075 [Urine:875; Stool:200] Intake/Output this shift: No intake/output data recorded.  General appearance: alert, cooperative and no distress GI: Soft, pretty sore, open wound looks very good. The ostomy is working well there is some area that will slough off. Pictures below.    Lab Results:   Recent Labs  05/31/16 0320 06/02/16 0510  WBC 9.3 7.7  HGB 8.9* 9.1*  HCT 26.9* 28.0*  PLT 390 372    BMET  Recent Labs  05/31/16 0320 06/01/16 0455  NA 137 138  K 4.1 3.7  CL 102 105  CO2 27 28  GLUCOSE 191* 153*  BUN 15 15  CREATININE 0.67 0.83  CALCIUM 8.6* 8.5*   PT/INR No results for input(s): LABPROT, INR in the last 72 hours.   Recent Labs Lab 05/29/16 0400 06/01/16 0455  AST 24 22  ALT 21 26  ALKPHOS 81 90  BILITOT 0.9 0.6  PROT 5.7* 5.7*  ALBUMIN 2.1* 2.2*     Lipase     Component Value Date/Time   LIPASE 11 05/16/2016 1031     Studies/Results: No results found.  Medications: . acetaminophen  1,000 mg Oral Q6H  . antiseptic oral rinse  7 mL Mouth Rinse q12n4p  . aspirin  81 mg Oral Daily  . atenolol   50 mg Oral Daily  . brimonidine  1 drop Both Eyes BID  . chlorhexidine  15 mL Mouth Rinse BID  . clopidogrel  75 mg Oral Daily  . enoxaparin (LOVENOX) injection  40 mg Subcutaneous Q24H  . famotidine  20 mg Oral QHS  . glimepiride  2 mg Oral Q breakfast  . insulin aspart  0-20 Units Subcutaneous TID WC  . insulin detemir  20 Units Subcutaneous BID  . latanoprost  1 drop Both Eyes QHS    Assessment/Plan Perforated diverticulitis with multiple intra-abdominal abscesses Status post oratory laparotomy, drainage of multiple intra-abdominal abscesses sigmoid colectomy and colostomy, 05/23/16 Dr. Jackolyn Confer. POD 10  Malnutrition-pre-albumin 8.2 - 05/29/16 FEN:  Cardiac modified diet/TNA will be discontinued today. ID: Day 14 Zosyn - I have discontinued this today. DVT: Lovenox/SCD  Plan: For discharge home today. I recommended family go with him up and down the stairs as long as he has the wound VAC in place.   He is scheduled for follow-up with Dr. [name] on Thursday, August 24 2 PM.   He is to follow-up with his primary care in 1 week.   His medications have been restarted and he is back on his preadmission medications as Recommended by the Hospitalist Service.  LOS: 17 days   JENNINGS,WILLARD 06/02/2016 [phone]  Agree with above. Ready to go home.  Alphonsa Overall, MD, [facility] Pager: [phone] Office phone:  [phone]

## 2016-06-02 NOTE — Progress Notes (Signed)
Pt vitals are WNL, tolerating diet and pain is under control. Discharge instructions were given to both patient and family members. All questions and concerns address. Patient discharge to home with prescription and wound vac/ostomy supplies.

## 2016-06-02 NOTE — Telephone Encounter (Signed)
1st attempt to reach pt for TCM. Left voicemail with contact info. Attempted to reach pt on home phone. Pt answered but was receiving IBC from spouse. Disconnected call and advised pt that another call would be made to him in future.

## 2016-06-02 NOTE — Progress Notes (Signed)
[place] Nurse ostomy follow up Stoma type/location: LUQ colostomy Stomal assessment/size: not measured Peristomal assessment: not viewed.  Current pouch intact without leakage or odor. Treatment options for stomal/peristomal skin: not applicable Output Small amount of thin brown Ostomy pouching: 2pc.  Education provided: Extensive teaching was provided to patient and his son, Delmont Sherod, for opening, emptying, burping, and closing the pouch.  Both patient and son fully particpated in return demonstrations for opening and closing the pouch.  Both verbalized how to empty it, and I demonstrated how to burp the pouch to remove excess gas.  They were instructed to empty the pouch when it is approximately 1/2 full, and that allowing it to fill too much will increase pulling off and leakage.  Both verbalized their understanding of this information.  Additionally, they were instructed to remove the wafer and to apply a new wafer and pouch approximately every 3 days.  Also, that if stool were to leak underneath the wafer, it should be removed and a new one placed.  Both verbalized their understanding and appreciation for information provided.   Discussed POC with patient and bedside nurse.   Thanks Val Riles MSN, RN, CNS-BC, [employer]

## 2016-06-02 NOTE — Discharge Instructions (Signed)
[facility]      [facility] [phone]  OPEN ABDOMINAL SURGERY: POST OP INSTRUCTIONS  Always review your discharge instruction sheet given to you by the facility where your surgery was performed.  IF YOU HAVE DISABILITY OR FAMILY LEAVE FORMS, YOU MUST BRING THEM TO THE OFFICE FOR PROCESSING.  PLEASE DO NOT GIVE THEM TO YOUR DOCTOR.  1. A prescription for pain medication may be given to you upon discharge.  Take your pain medication as prescribed, if needed.  If narcotic pain medicine is not needed, then you may take acetaminophen (Tylenol) or ibuprofen (Advil) as needed. 2. Take your usually prescribed medications unless otherwise directed. 3. If you need a refill on your pain medication, please contact your pharmacy. They will contact our office to request authorization.  Prescriptions will not be filled after 5pm or on week-ends. 4. You should follow a light diet the first few days after arrival home, such as soup and crackers, pudding, etc.unless your doctor has advised otherwise. A high-fiber, low fat diet can be resumed as tolerated.   Be sure to include lots of fluids daily. Most patients will experience some swelling and bruising on the chest and neck area.  Ice packs will help.  Swelling and bruising can take several days to resolve 5. Most patients will experience some swelling and bruising in the area of the incision. Ice pack will help. Swelling and bruising can take several days to resolve..  6. It is common to experience some constipation if taking pain medication after surgery.  Increasing fluid intake and taking a stool softener will usually help or prevent this problem from occurring.  A mild laxative (Milk of Magnesia or Miralax) should be taken according to package directions if there are no bowel movements after 48 hours. 7.  You may have steri-strips (small skin tapes) in place directly over the incision.  These strips should be left on the skin for 7-10 days.  If your  surgeon used skin glue on the incision, you may shower in 24 hours.  The glue will flake off over the next 2-3 weeks.  Any sutures or staples will be removed at the office during your follow-up visit. You may find that a light gauze bandage over your incision may keep your staples from being rubbed or pulled. You may shower and replace the bandage daily. 8. ACTIVITIES:  You may resume regular (light) daily activities beginning the next day--such as daily self-care, walking, climbing stairs--gradually increasing activities as tolerated.  You may have sexual intercourse when it is comfortable.  Refrain from any heavy lifting or straining until approved by your doctor. a. You may drive when you no longer are taking prescription pain medication, you can comfortably wear a seatbelt, and you can safely maneuver your car and apply brakes b. Return to Work: ___________________________________ 9. You should see your doctor in the office for a follow-up appointment approximately two weeks after your surgery.  Make sure that you call for this appointment within a day or two after you arrive home to insure a convenient appointment time. OTHER INSTRUCTIONS:  _____________________________________________________________ _____________________________________________________________  WHEN TO CALL YOUR DOCTOR: 1. Fever over 101.0 2. Inability to urinate 3. Nausea and/or vomiting 4. Extreme swelling or bruising 5. Continued bleeding from incision. 6. Increased pain, redness, or drainage from the incision. 7. Difficulty swallowing or breathing 8. Muscle cramping or spasms. 9. Numbness or tingling in hands or feet or around lips.  The clinic staff is available to   answer your questions during regular business hours.  Please don't hesitate to call and ask to speak to one of the nurses if you have concerns.  For further questions, please visit [email or web address]   

## 2016-06-02 NOTE — Progress Notes (Signed)
Spoke with patient and spouse at bedside. Ready for d/c, anxious about going home, lots of questions. Wife had appropriate questions, she has educated herself with material provided. She asked about a hospital bed, discussed with her and she will discuss with physician. She did not need a lift chair, she states she got one from a friend. No further d/c needs identified. Awaiting final d/c orders, vac change to home unit.

## 2016-06-02 NOTE — Consult Note (Signed)
WTA Nurse wound follow up Wound type: Surgical Measurement: 17cm x 4cm x 1.0 cm Wound bed: red, moist Drainage (amount, consistency, odor) scant serous drainage, no odor present Periwound: intact Dressing procedure/placement/frequency: NPWT dressing with be changed 3 x week by [facility] beginning Monday, Jun 05, 2016.  Fara Olden, RN-C, WTA-C Wound Treatment Associate

## 2016-06-03 DIAGNOSIS — Z7901 Long term (current) use of anticoagulants: Secondary | ICD-10-CM | POA: Diagnosis not present

## 2016-06-03 DIAGNOSIS — A419 Sepsis, unspecified organism: Secondary | ICD-10-CM | POA: Diagnosis not present

## 2016-06-03 DIAGNOSIS — Z6835 Body mass index (BMI) 35.0-35.9, adult: Secondary | ICD-10-CM | POA: Diagnosis not present

## 2016-06-03 DIAGNOSIS — Z452 Encounter for adjustment and management of vascular access device: Secondary | ICD-10-CM | POA: Diagnosis not present

## 2016-06-03 DIAGNOSIS — Z792 Long term (current) use of antibiotics: Secondary | ICD-10-CM | POA: Diagnosis not present

## 2016-06-03 DIAGNOSIS — G4733 Obstructive sleep apnea (adult) (pediatric): Secondary | ICD-10-CM | POA: Diagnosis not present

## 2016-06-03 DIAGNOSIS — Z5181 Encounter for therapeutic drug level monitoring: Secondary | ICD-10-CM | POA: Diagnosis not present

## 2016-06-03 DIAGNOSIS — Z48815 Encounter for surgical aftercare following surgery on the digestive system: Secondary | ICD-10-CM | POA: Diagnosis not present

## 2016-06-03 DIAGNOSIS — I10 Essential (primary) hypertension: Secondary | ICD-10-CM | POA: Diagnosis not present

## 2016-06-03 DIAGNOSIS — E669 Obesity, unspecified: Secondary | ICD-10-CM | POA: Diagnosis not present

## 2016-06-03 DIAGNOSIS — Z7982 Long term (current) use of aspirin: Secondary | ICD-10-CM | POA: Diagnosis not present

## 2016-06-03 DIAGNOSIS — Z7984 Long term (current) use of oral hypoglycemic drugs: Secondary | ICD-10-CM | POA: Diagnosis not present

## 2016-06-03 DIAGNOSIS — E1165 Type 2 diabetes mellitus with hyperglycemia: Secondary | ICD-10-CM | POA: Diagnosis not present

## 2016-06-03 DIAGNOSIS — I2510 Atherosclerotic heart disease of native coronary artery without angina pectoris: Secondary | ICD-10-CM | POA: Diagnosis not present

## 2016-06-03 DIAGNOSIS — E785 Hyperlipidemia, unspecified: Secondary | ICD-10-CM | POA: Diagnosis not present

## 2016-06-05 ENCOUNTER — Other Ambulatory Visit: Payer: Self-pay | Admitting: *Deleted

## 2016-06-05 ENCOUNTER — Encounter: Payer: Self-pay | Admitting: *Deleted

## 2016-06-05 NOTE — Patient Outreach (Signed)
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  Patient was treated with antibiotics, TPN, and wound vacuum.  Patient had post-op ileus, and has additional/ concurrent diagnosis of HTN, DM, CAD with stent, hyperlipidemia, obesity, and OSA requiring CPAP.  Patient was discharged home with home health [facility]) services.  HIPAA verified through patient's wife Hany Grande, who returned my call from earlier today.  Today, Mrs. Chan reports that patient is "doing pretty good," after his discharge home from the hospital.   Mrs. Gonyeau states that [facility] nurse "just left," and stated that patient is lying down for a nap.  Mrs. Manwiller reported that patient has a scheduled follow up visit with Dr. [name] on June 15, 2016, and stated that patient plans on attending the appointment.  We discussed [facility] Community CM services, and I explained to Mrs. Lara how [facility] CM services differ from [facility] services, to which Mrs. Bargeron verbalized understanding.  Mrs. Leahy verbally consented to [facility] services during today's call.  I explained to Mrs. Sabins that I would be making weekly telephone calls to patient, and we were able to schedule an in-home visit for later this month.    Mrs. Wallach abruptly stated during phone call that she had to go, as someone was at her door.  Mrs. Berger stated she would call me back when she was able.  Plan:  Mr. Persky will take his medications as prescribed and will attend all scheduled  provider appointments.  Mr. Dover will work with [facility] services as ordered post-discharge from [place] visit.  I will make Mr. Travers PCP aware of [place] CM involvement in patient's post-hospital discharge care.  Continued [facility] Community CM outreach for transition of care; will call patient back later this week if I do not hear back from patient or his wife first.   [name], RN, BSN, [employer] Coordinator [facility] Care Management  [phone]

## 2016-06-05 NOTE — Patient Outreach (Signed)
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  Patient was treated with antibiotics, TPN, and wound vacuum.  Patient had post-op ileus, and has additional/ concurrent diagnosis of HTN, DM, CAD with stent, hyperlipidemia, obesity, and OSA requiring CPAP.  Patient was discharged home with home health [facility]) services.  HIPAA compliant voice mail (VM) message left on patient's outgoing voice mail, asking him to return my call.   Plan:  Will re-attempt [place] telephone outreach for transition of care tomorrow, if I do not hear back from the patient first.  [name], RN, BSN, [place] Coordinator [facility] Care Management  [phone]

## 2016-06-06 ENCOUNTER — Ambulatory Visit: Payer: PPO | Admitting: *Deleted

## 2016-06-07 ENCOUNTER — Other Ambulatory Visit: Payer: Self-pay | Admitting: *Deleted

## 2016-06-07 NOTE — Patient Outreach (Signed)
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 Patient was treated with antibiotics, TPN, and wound vacuum. Patient had post-op ileus, and has additional/ concurrent diagnosis of HTN, DM, CAD with stent, hyperlipidemia, obesity, and OSA requiring CPAP. Patient was discharged home with home health [facility]) services.  HIPAA verified through patient's wife Ladanian Guzowski.   Today, Mrs. Hooey reports that patient continues to be "doing good," after his discharge home from the hospital, and reports that home health [facility]) services remain active.  Mrs.Thier reported that on Monday morning, June 05, 2016, [facility] nurse "Elta Guadeloupe" contacted patient's surgeon in an effort to arrange an urgent office visit to assess patient's stoma site for his new colostomy, as the site "has a few areas of dark coloration," which Mrs. Czerniak reports concerned the [facility] nurse. Mrs. Edkins confirmed that she had heard back from the office staff at [facility] who had told her that the "dark areas" on the colostomy stoma site "were okay, and could wait to be looked at during the previously scheduled appointment" for patient.  Mrs. Pike reported that although the staff told her "it was okay to wait," [facility] RN Elta Guadeloupe was continuing to try and get an earlier appointment, prior to their scheduled appointment on June 15, 2016.  Mrs. Debar denied that the patient was running a  fever or had any other signs/ symptoms concerning of infection.  Mrs. Rabara denied the need for me to contact [facility], stating that she believed the efforts of [facility] RN Elta Guadeloupe "were enough," and she didn't want to overwhelm the office staff, since she had already heard back from them.  Mrs. Risi stated that if she became concerned she would take patient to the ED for assessment of the site if necessary.  Mrs. Dooling stated that patient has all of his medications and has been taking all of his medications as they are prescribed, and reported that he "wasn't having to take too many pain pills," stating that his pain was under control and commenting that "he knows he can get hooked on those pills, and he doesn't want that to happen."  I educated Mrs. Engberg on proper use of pain medication, and assured her that patient should not become addicted to them if they are taken as prescribed, and only as needed for pain, to which she verbalized understanding and confirmed patient was currently doing.  Mrs. Ramus reported that patient has "had some trouble swallowing his pills," which he has never experienced before, stating that patient "has to take them slowly."  Mrs. Pichette said that patient was having no trouble with swallowing food, only his pills, and stated that the [facility] speech therapist was scheduled to come and evaluate the patient for this next week.  Mrs. Carmical denied further concerns, problems, questions, or issues today, and we confirmed [place] in-home visit scheduled for later this week.   Plan:  Mr. Calver will take his medications as prescribed and will attend all scheduled provider appointments.  Mr. Neudecker will work with [facility] services as  ordered post-discharge from IP hospital visit.  Mr. or Mrs. Ellwanger will contact patient's providers promptly for any concerns, questions, problems or needs that arise prior to scheduled provider appointments.  Continued [facility] Community CM outreach  for transition of care; with telephone outreach scheduled next week and in-home visit later this month.[name], RN, BSN, [place] Coordinator [facility] Care Management  [phone]

## 2016-06-08 DIAGNOSIS — Z452 Encounter for adjustment and management of vascular access device: Secondary | ICD-10-CM | POA: Diagnosis not present

## 2016-06-08 DIAGNOSIS — Z48815 Encounter for surgical aftercare following surgery on the digestive system: Secondary | ICD-10-CM | POA: Diagnosis not present

## 2016-06-08 DIAGNOSIS — Z5181 Encounter for therapeutic drug level monitoring: Secondary | ICD-10-CM | POA: Diagnosis not present

## 2016-06-08 DIAGNOSIS — A419 Sepsis, unspecified organism: Secondary | ICD-10-CM | POA: Diagnosis not present

## 2016-06-08 DIAGNOSIS — Z7901 Long term (current) use of anticoagulants: Secondary | ICD-10-CM | POA: Diagnosis not present

## 2016-06-08 DIAGNOSIS — Z6835 Body mass index (BMI) 35.0-35.9, adult: Secondary | ICD-10-CM | POA: Diagnosis not present

## 2016-06-08 DIAGNOSIS — Z792 Long term (current) use of antibiotics: Secondary | ICD-10-CM | POA: Diagnosis not present

## 2016-06-08 MED ORDER — OXYCODONE HCL 10 MG PO TABS: 10.0000 mg | ORAL_TABLET | Freq: Three times a day (TID) | ORAL | 0 refills | Status: DC | PRN

## 2016-06-08 MED ORDER — METFORMIN HCL 1000 MG PO TABS: 1000.0000 mg | ORAL_TABLET | Freq: Two times a day (BID) | ORAL | 1 refills | Status: DC

## 2016-06-08 NOTE — Addendum Note (Signed)
Addended by: Royann Shivers A on: 06/08/2016 06:22 PM   Modules accepted: Orders

## 2016-06-08 NOTE — Telephone Encounter (Signed)
Patient notified and verbalized understanding. Rx placed up front for pick up.  He said since he has started sleeping upstairs in his bed again, he has been waking up in the middle of the night sweating. He asks what could be causing this. I asked him to check his temp and blood sugar if it happens again and if they are abnormal to call me. Otherwise, we would discuss it at his follow up next week.

## 2016-06-08 NOTE — Addendum Note (Signed)
Addended by: [name] on: 06/08/2016 05:36 PM   Modules accepted: Orders

## 2016-06-08 NOTE — Telephone Encounter (Signed)
Received message pt needs oxycodone refilled after recent intra abdominal abscess surgery.  rx printed and in kims' box. plz decrease oxycodone use as able. New sig will be 10mg  TID PRN.

## 2016-06-08 NOTE — Telephone Encounter (Signed)
Transition Care Management Follow-up Telephone Call    Date discharged? 06/02/2016  How have you been since you were released from the hospital? Recovering  Any patient concerns? Pt needs medication refills and a handicapped parking sticker. This was sent in separate note to PCP.    Items Reviewed:  Medications reviewed: Yes  Allergies reviewed: Yes  Dietary changes reviewed: N/A  Referrals reviewed: Yes   Functional Questionnaire:  Independent - I Dependent - D    Activities of Daily Living (ADLs):    Personal hygiene - I (spouse helps with bathing) Dressing - I Eating - I Maintaining continence - I Transferring - I (uses a rolling walker)  Independent Activities of Daily Living (iADLs): Basic communication skills - I Transportation - D (spouse drives) Meal preparation  - D (spouse drives) Shopping - D Housework - D  Managing medications - I  Managing personal finances - I   Confirmed importance and date/time of follow-up visits scheduled YES  Provider Appointment booked with PCP 06/13/16 @ 1530  Confirmed with patient if condition begins to worsen call PCP or go to the ER.  Patient was given the office number and encouraged to call back with question or concerns: YES

## 2016-06-09 ENCOUNTER — Telehealth: Payer: Self-pay | Admitting: Family Medicine

## 2016-06-09 NOTE — Telephone Encounter (Signed)
Patient notified and form placed up front for pick up. 

## 2016-06-09 NOTE — Telephone Encounter (Signed)
Spoke with patient. There was a misunderstanding when I spoke with him last night about his Rx. He thought I was putting the placard in with his Rx and I thought he understood me to say that we would get him one at his follow up. Form in your IN box for signature now.

## 2016-06-09 NOTE — Telephone Encounter (Signed)
Temporary placard application filled out and in [place] 'box.

## 2016-06-09 NOTE — Telephone Encounter (Signed)
Wife came by and picked up a rx for handicap parking placard.  She was told that there would be other information on how to obtain a handicap placard in the envelope, but it was missing.  Was something else supposed to be included? cb number is [phone] Thanks

## 2016-06-12 DIAGNOSIS — Z792 Long term (current) use of antibiotics: Secondary | ICD-10-CM | POA: Diagnosis not present

## 2016-06-12 DIAGNOSIS — Z5181 Encounter for therapeutic drug level monitoring: Secondary | ICD-10-CM | POA: Diagnosis not present

## 2016-06-12 DIAGNOSIS — Z452 Encounter for adjustment and management of vascular access device: Secondary | ICD-10-CM | POA: Diagnosis not present

## 2016-06-12 DIAGNOSIS — Z48815 Encounter for surgical aftercare following surgery on the digestive system: Secondary | ICD-10-CM | POA: Diagnosis not present

## 2016-06-12 DIAGNOSIS — A419 Sepsis, unspecified organism: Secondary | ICD-10-CM | POA: Diagnosis not present

## 2016-06-12 DIAGNOSIS — Z6835 Body mass index (BMI) 35.0-35.9, adult: Secondary | ICD-10-CM | POA: Diagnosis not present

## 2016-06-12 DIAGNOSIS — Z7901 Long term (current) use of anticoagulants: Secondary | ICD-10-CM | POA: Diagnosis not present

## 2016-06-13 ENCOUNTER — Ambulatory Visit: Payer: Self-pay | Admitting: *Deleted

## 2016-06-13 ENCOUNTER — Ambulatory Visit (INDEPENDENT_AMBULATORY_CARE_PROVIDER_SITE_OTHER): Payer: PPO | Admitting: Family Medicine

## 2016-06-13 ENCOUNTER — Encounter: Payer: Self-pay | Admitting: Family Medicine

## 2016-06-13 VITALS — BP 134/70 | HR 84 | Wt 220.0 lb

## 2016-06-13 DIAGNOSIS — Z87891 Personal history of nicotine dependence: Secondary | ICD-10-CM

## 2016-06-13 DIAGNOSIS — K651 Peritoneal abscess: Secondary | ICD-10-CM | POA: Diagnosis not present

## 2016-06-13 DIAGNOSIS — Z933 Colostomy status: Secondary | ICD-10-CM | POA: Insufficient documentation

## 2016-06-13 DIAGNOSIS — K5720 Diverticulitis of large intestine with perforation and abscess without bleeding: Secondary | ICD-10-CM | POA: Diagnosis not present

## 2016-06-13 DIAGNOSIS — E118 Type 2 diabetes mellitus with unspecified complications: Secondary | ICD-10-CM

## 2016-06-13 DIAGNOSIS — IMO0002 Reserved for concepts with insufficient information to code with codable children: Secondary | ICD-10-CM

## 2016-06-13 DIAGNOSIS — E1165 Type 2 diabetes mellitus with hyperglycemia: Secondary | ICD-10-CM

## 2016-06-13 NOTE — Assessment & Plan Note (Signed)
Congratulated on smoking cessation.  

## 2016-06-13 NOTE — Assessment & Plan Note (Addendum)
Denies hypoglycemia despite recent weight loss noted. RTC 3 mo f/u visit with labs.

## 2016-06-13 NOTE — Assessment & Plan Note (Signed)
S/p surgery, now with wound vac and colostomy. HHRN involved.

## 2016-06-13 NOTE — Progress Notes (Signed)
BP 134/70   Pulse 84   Wt 220 lb (99.8 kg)   SpO2 94%   BMI 33.45 kg/m    CC: hosp f/u visit Subjective:    Patient ID: Evan Avery, male    DOB: 03/01/1946, 70 y.o.   MRN: [id number]  HPI: Evan Avery is a 70 y.o. male presenting on 06/13/2016 for Hospitalization Follow-up   Recent hospitalization for perforated diverticulitis s/p ex lap with drainage of multiple intra abdominal abscesses by surgery. He also underwent sigmoid colectomy and colostomy. Discharged with wound vac in place. HHRN and wound care involved.   Sore throat since surgery - speech therapy involved. Since home denies fevers, nausea/vomiting.  Currently off lasix.  Weight loss noted. Oxycodone refilled #15 last week.  Temporary handicap placard filled out last week.   Quit smoking 05/16/2016!   Admit date: 05/16/2016 Discharge date: 06/02/2016 F/u phone call performed: 06/02/2016  Admitted From: Home Disposition:  Home with home health  Recommendations for Outpatient Follow-up:  1. Follow up with PCP in 1-2 weeks 2. Outpatient follow-up. Dr. [name] on 06/15/2016 at 2 pm  Home Health: RN Equipment/Devices: Colostomy/wound VAC  Discharge Condition: Stable CODE STATUS: Full code Diet recommendation: Diabetic/heart healthy  Discharge Diagnoses:   Principal problem Sepsis ([place])  Active Problems:     Diverticulitis of colon with perforation   Colonic diverticular abscess   Hyperlipidemia with target LDL less than 70   Essential hypertension   Obesity, Class II, BMI 35-39.9, with comorbidity ([place])   CAD in native artery     Diabetes mellitus type 2, uncontrolled, with complications (HCC)   OSA on CPAP  Relevant past medical, surgical, family and social history reviewed and updated as indicated. Interim medical history since our last visit reviewed. Allergies and medications reviewed and updated. Current Outpatient Prescriptions on File Prior to Visit  Medication Sig  .  aspirin 81 MG tablet Take 81 mg by mouth daily.    Marland Kitchen atenolol (TENORMIN) 50 MG tablet TAKE ONE & ONE-HALF TABLETS BY MOUTH ONCE DAILY (Patient taking differently: TAKE ONE & ONE-HALF TABLETS =75mg  BY MOUTH ONCE DAILY)  . atorvastatin (LIPITOR) 40 MG tablet TAKE ONE-HALF TABLET BY MOUTH ONCE DAILY (Patient taking differently: TAKE ONE-HALF TABLET= 20mg  BY MOUTH ONCE DAILY)  . brimonidine (ALPHAGAN) 0.2 % ophthalmic solution Place 1 drop into both eyes 2 (two) times daily.   . clopidogrel (PLAVIX) 75 MG tablet TAKE ONE TABLET BY MOUTH ONCE DAILY  . Coenzyme Q10 200 MG capsule Take 200 mg by mouth daily.  . fenofibrate 160 MG tablet Take 1 tablet (160 mg total) by mouth daily.  . furosemide (LASIX) 20 MG tablet Take 1 tablet (20 mg total) by mouth daily as needed for fluid or edema.  Marland Kitchen glimepiride (AMARYL) 2 MG tablet Take 1 tablet (2 mg total) by mouth daily with breakfast.  . Glucosamine-Chondroitin-Vit D3 1500-1200-800 MG-MG-UNIT PACK Take 1 tablet by mouth daily.  Marland Kitchen latanoprost (XALATAN) 0.005 % ophthalmic solution Place 1 drop into both eyes at bedtime.   Marland Kitchen lisinopril (PRINIVIL,ZESTRIL) 2.5 MG tablet Take 1 tablet (2.5 mg total) by mouth daily.  . metFORMIN (GLUCOPHAGE) 1000 MG tablet Take 1 tablet (1,000 mg total) by mouth 2 (two) times daily with a meal.  . omega-3 acid ethyl esters (LOVAZA) 1 g capsule TAKE TWO CAPSULES BY MOUTH TWICE DAILY  . Oxycodone HCl 10 MG TABS Take 1 tablet (10 mg total) by mouth 3 (three) times daily as needed.  Marland Kitchen  senna-docusate (SENOKOT-S) 8.6-50 MG tablet Take 1 tablet by mouth at bedtime as needed for mild constipation.   No current facility-administered medications on file prior to visit.     Review of Systems Per HPI unless specifically indicated in ROS section     Objective:    BP 134/70   Pulse 84   Wt 220 lb (99.8 kg)   SpO2 94%   BMI 33.45 kg/m   Wt Readings from Last 3 Encounters:  06/13/16 220 lb (99.8 kg)  05/16/16 238 lb 8.6 oz (108.2 kg)    03/13/16 247 lb 6.4 oz (112.2 kg)    Physical Exam  Constitutional: He appears well-developed and well-nourished. No distress.  Walks with rollator  HENT:  Head: Normocephalic and atraumatic.  Mouth/Throat: Oropharynx is clear and moist. No oropharyngeal exudate.  Eyes: Conjunctivae and EOM are normal. Pupils are equal, round, and reactive to light.  Neck: Normal range of motion. Neck supple.  Cardiovascular: Normal rate, regular rhythm, normal heart sounds and intact distal pulses.   No murmur heard. Pulmonary/Chest: Effort normal and breath sounds normal. No respiratory distress. He has no wheezes. He has no rales.  Abdominal: Soft. Bowel sounds are normal. He exhibits no distension and no mass. There is no tenderness. There is no rebound and no guarding.  Colostomy c/d/i, incision with wound vac c/d/i  Musculoskeletal: Normal range of motion.  Skin: Skin is warm and dry. No rash noted.  Psychiatric: He has a normal mood and affect.  Nursing note and vitals reviewed.  Lab Results  Component Value Date   HGBA1C 7.2 (H) 05/16/2016       Assessment & Plan:   Problem List Items Addressed This Visit    Colostomy in place [facility])   Diabetes mellitus type 2, uncontrolled, with complications ([place])    Denies hypoglycemia despite recent weight loss noted. RTC 3 mo f/u visit with labs.       Diverticulitis of colon with perforation - Primary    S/p surgery, now with wound vac and colostomy. HHRN involved.       Ex-smoker    Congratulated on smoking cessation.       Intra-abdominal abscess ([place])    S/p ex lap with multiple abscesses drained. Continues healing slowly.        Other Visit Diagnoses   None.      Follow up plan: Return in about 3 months (around 09/13/2016) for follow up visit.  [name], MD

## 2016-06-13 NOTE — Assessment & Plan Note (Signed)
S/p ex lap with multiple abscesses drained. Continues healing slowly.

## 2016-06-13 NOTE — Patient Instructions (Addendum)
Return to see me in 3 months for follow up.  You are doing well today.

## 2016-06-14 ENCOUNTER — Other Ambulatory Visit: Payer: Self-pay | Admitting: *Deleted

## 2016-06-14 DIAGNOSIS — I2510 Atherosclerotic heart disease of native coronary artery without angina pectoris: Secondary | ICD-10-CM | POA: Diagnosis not present

## 2016-06-14 DIAGNOSIS — Z7901 Long term (current) use of anticoagulants: Secondary | ICD-10-CM | POA: Diagnosis not present

## 2016-06-14 DIAGNOSIS — Z7982 Long term (current) use of aspirin: Secondary | ICD-10-CM | POA: Diagnosis not present

## 2016-06-14 DIAGNOSIS — Z792 Long term (current) use of antibiotics: Secondary | ICD-10-CM | POA: Diagnosis not present

## 2016-06-14 DIAGNOSIS — E669 Obesity, unspecified: Secondary | ICD-10-CM | POA: Diagnosis not present

## 2016-06-14 DIAGNOSIS — Z6835 Body mass index (BMI) 35.0-35.9, adult: Secondary | ICD-10-CM | POA: Diagnosis not present

## 2016-06-14 DIAGNOSIS — Z452 Encounter for adjustment and management of vascular access device: Secondary | ICD-10-CM | POA: Diagnosis not present

## 2016-06-14 DIAGNOSIS — E785 Hyperlipidemia, unspecified: Secondary | ICD-10-CM | POA: Diagnosis not present

## 2016-06-14 DIAGNOSIS — E1165 Type 2 diabetes mellitus with hyperglycemia: Secondary | ICD-10-CM | POA: Diagnosis not present

## 2016-06-14 DIAGNOSIS — Z7984 Long term (current) use of oral hypoglycemic drugs: Secondary | ICD-10-CM | POA: Diagnosis not present

## 2016-06-14 DIAGNOSIS — A419 Sepsis, unspecified organism: Secondary | ICD-10-CM | POA: Diagnosis not present

## 2016-06-14 DIAGNOSIS — Z48815 Encounter for surgical aftercare following surgery on the digestive system: Secondary | ICD-10-CM | POA: Diagnosis not present

## 2016-06-14 DIAGNOSIS — Z5181 Encounter for therapeutic drug level monitoring: Secondary | ICD-10-CM | POA: Diagnosis not present

## 2016-06-14 DIAGNOSIS — I10 Essential (primary) hypertension: Secondary | ICD-10-CM | POA: Diagnosis not present

## 2016-06-14 DIAGNOSIS — G4733 Obstructive sleep apnea (adult) (pediatric): Secondary | ICD-10-CM | POA: Diagnosis not present

## 2016-06-14 NOTE — Patient Outreach (Signed)
Evan Avery) Care Management [place] Telephone Outreach, Transition of care day 9 06/14/2016  Evan Avery 07-Jul-1946 [id number]   Successful telephone outreach to Evan Avery, 70 y/o male referred to [place] for transition of care afterrecent IP hospitalization July 25- June 02, 2016 for sepsis secondary to diverticulitis with colon perforation and multiple colon abscesseswhich required surgical intervention; patient had an exploratory laparotomy/ colectomy on May 23, 2016 which resulted in colostomy. Patient was treated with antibiotics, TPN, and wound vacuum. Patient had post-op ileus, and has additional/ concurrent diagnosis of HTN, DM, CAD with stent, hyperlipidemia, obesity, and OSA requiring CPAP. Patient was discharged home with home health Evan Avery) services. HIPAA verified.   Today, Evan Avery reports that he is "doing good," after his discharge home from the hospital, and reports that home health Evan Avery) services remain active; patient reports that his "speech nurse" is there with him now and shares that he can not talk for very long today, as they are in the middle of their work together.  Evan Avery confirmed that he attended his follow up office visit with his PCP yesterday, which he states "went well."  Evan Avery reported that no changes were made as a result of the office visit yesterday, and he voices plans to attend his scheduled follow up visit with Evan Avery tomorrow.    Patient reports that he is taking his medications as prescribed, and denies concerns, issues, problems, or needs during our call today.  I encouraged patient to contact his providers for any issues or concerns that arise, and he agreed to do so.  Evan Avery denied further concerns, problems, questions, or issues today, and we confirmed [place] in-home visit scheduled for next week.   Plan:  Evan Avery will take his medications as prescribed and  will attend all scheduled provider appointments.  Evan Avery will work with Evan Avery services as ordered post-discharge from [place] visit.  Evan Avery will contact patient's providers promptly for any concerns, questions, problems or needs that arise prior to scheduled provider appointments.  Continued [facility] CM outreach for transition of care; with scheduled in-home visit next week.  Evan Rack, RN, BSN, [employer] [facility] Care Management  [phone]

## 2016-06-16 DIAGNOSIS — E1165 Type 2 diabetes mellitus with hyperglycemia: Secondary | ICD-10-CM | POA: Diagnosis not present

## 2016-06-16 DIAGNOSIS — E785 Hyperlipidemia, unspecified: Secondary | ICD-10-CM | POA: Diagnosis not present

## 2016-06-16 DIAGNOSIS — I10 Essential (primary) hypertension: Secondary | ICD-10-CM | POA: Diagnosis not present

## 2016-06-16 DIAGNOSIS — Z7984 Long term (current) use of oral hypoglycemic drugs: Secondary | ICD-10-CM | POA: Diagnosis not present

## 2016-06-16 DIAGNOSIS — Z48815 Encounter for surgical aftercare following surgery on the digestive system: Secondary | ICD-10-CM | POA: Diagnosis not present

## 2016-06-16 DIAGNOSIS — G4733 Obstructive sleep apnea (adult) (pediatric): Secondary | ICD-10-CM | POA: Diagnosis not present

## 2016-06-16 DIAGNOSIS — Z7982 Long term (current) use of aspirin: Secondary | ICD-10-CM | POA: Diagnosis not present

## 2016-06-16 DIAGNOSIS — E669 Obesity, unspecified: Secondary | ICD-10-CM | POA: Diagnosis not present

## 2016-06-16 DIAGNOSIS — Z7901 Long term (current) use of anticoagulants: Secondary | ICD-10-CM | POA: Diagnosis not present

## 2016-06-16 DIAGNOSIS — A419 Sepsis, unspecified organism: Secondary | ICD-10-CM | POA: Diagnosis not present

## 2016-06-16 DIAGNOSIS — Z6835 Body mass index (BMI) 35.0-35.9, adult: Secondary | ICD-10-CM | POA: Diagnosis not present

## 2016-06-16 DIAGNOSIS — Z792 Long term (current) use of antibiotics: Secondary | ICD-10-CM | POA: Diagnosis not present

## 2016-06-16 DIAGNOSIS — Z5181 Encounter for therapeutic drug level monitoring: Secondary | ICD-10-CM | POA: Diagnosis not present

## 2016-06-16 DIAGNOSIS — Z452 Encounter for adjustment and management of vascular access device: Secondary | ICD-10-CM | POA: Diagnosis not present

## 2016-06-16 DIAGNOSIS — I2510 Atherosclerotic heart disease of native coronary artery without angina pectoris: Secondary | ICD-10-CM | POA: Diagnosis not present

## 2016-06-21 ENCOUNTER — Encounter: Payer: Self-pay | Admitting: *Deleted

## 2016-06-21 ENCOUNTER — Other Ambulatory Visit: Payer: Self-pay | Admitting: *Deleted

## 2016-06-21 DIAGNOSIS — Z7982 Long term (current) use of aspirin: Secondary | ICD-10-CM | POA: Diagnosis not present

## 2016-06-21 DIAGNOSIS — E785 Hyperlipidemia, unspecified: Secondary | ICD-10-CM | POA: Diagnosis not present

## 2016-06-21 DIAGNOSIS — I10 Essential (primary) hypertension: Secondary | ICD-10-CM | POA: Diagnosis not present

## 2016-06-21 DIAGNOSIS — Z48815 Encounter for surgical aftercare following surgery on the digestive system: Secondary | ICD-10-CM | POA: Diagnosis not present

## 2016-06-21 DIAGNOSIS — Z792 Long term (current) use of antibiotics: Secondary | ICD-10-CM | POA: Diagnosis not present

## 2016-06-21 DIAGNOSIS — A419 Sepsis, unspecified organism: Secondary | ICD-10-CM | POA: Diagnosis not present

## 2016-06-21 DIAGNOSIS — E669 Obesity, unspecified: Secondary | ICD-10-CM | POA: Diagnosis not present

## 2016-06-21 DIAGNOSIS — E1165 Type 2 diabetes mellitus with hyperglycemia: Secondary | ICD-10-CM | POA: Diagnosis not present

## 2016-06-21 DIAGNOSIS — Z7901 Long term (current) use of anticoagulants: Secondary | ICD-10-CM | POA: Diagnosis not present

## 2016-06-21 DIAGNOSIS — Z5181 Encounter for therapeutic drug level monitoring: Secondary | ICD-10-CM | POA: Diagnosis not present

## 2016-06-21 DIAGNOSIS — Z7984 Long term (current) use of oral hypoglycemic drugs: Secondary | ICD-10-CM | POA: Diagnosis not present

## 2016-06-21 DIAGNOSIS — I2510 Atherosclerotic heart disease of native coronary artery without angina pectoris: Secondary | ICD-10-CM | POA: Diagnosis not present

## 2016-06-21 DIAGNOSIS — Z6835 Body mass index (BMI) 35.0-35.9, adult: Secondary | ICD-10-CM | POA: Diagnosis not present

## 2016-06-21 DIAGNOSIS — Z452 Encounter for adjustment and management of vascular access device: Secondary | ICD-10-CM | POA: Diagnosis not present

## 2016-06-21 DIAGNOSIS — G4733 Obstructive sleep apnea (adult) (pediatric): Secondary | ICD-10-CM | POA: Diagnosis not present

## 2016-06-21 NOTE — Patient Outreach (Signed)
Evan Avery) Care Management  [place] Initial Home Visit, Transition of Care day 16 06/21/2016  Evan Avery 12/29/1945 [id number]  Evan Avery "Evan Avery" is an 70 y.o. 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 Patient was treated with antibiotics, TPN, and wound vacuum. Patient had post-op ileus, and has additional/ concurrent diagnosis of HTN, DM, CAD with stent, hyperlipidemia, obesity, and OSA requiring CPAP. Patient was discharged home with home health [employer]) services.   Today, Evan Avery reports that he is"still doing pretty good," after his discharge home from the hospital, and reports that home health [facility]) services remain active; Evan Avery and his wife Evan Avery are very pleased with patient's [facility] RN "Elta Guadeloupe," and states that he has been very helpful in patient's recuperation.  Evan Avery reports that "Elta Guadeloupe" has requested extended time for [facility] services to be involved in Evan Avery's care, at least until the wound vac is removed, which they report "has been approved" by [employer] medical providers.  Evan Avery reports that he has all of his medications and is taking as prescribed, and verbalizes a good general understanding of the purpose, dosing, and scheduling of his medications.  Medication reconciliation was performed today, as patient was recently discharged from hospital;  all medications were reviewed with patient and his wife during today's in-home visit.  Community resource needs were denied by patient and his wife, who report an excellent support system through their family members.  No safety issues were identified during today's in-home visit, and patient ambulates easily with a steady gait without the  use of assistive devices.  Evan Avery and his wife Evan Avery provided an accurate report of upcoming provider appointments, which they plan to attend. Patient and his wife were advised to contact medical providers promptly for any new concerns, issues, problems, or needs, which they verbalized understanding and agreed to do.   Today, Evan Avery and his wife report a general lack of knowledge about the care and self-management of Evan Avery's new colostomy.  Evan Avery stated that he was sent 7 bags from [facility] shich were vented bags, which he is not pleased with, stating that he prefers the un-vented bags due to the odor with the vented bags.  HH RN Doctor, general practice visited [place] today and changed the colostomy bag and applied tape securely to the vent, which Evan Avery is happy about.  Both Evan Avery and his wife would like additional instruction on the care/ management of Evan Avery's new colostomy.  During our visit today, I unsuccessfully attempted to page [place] nurse for information; we contracted together that I would reach out to the [facility] RN staff at the hospital to obtain the information they are requesting.  Evan Avery and his wife Evan Avery denied further concerns, problems, questions, or issues today, and we scheduled our next in-home visit for next month.  Subjective: "I am doing so much better but am frustrated with this colostomy.  I feel like I don't know how to manage it."  Objective:    BP (!) 144/64   Pulse 68   Resp 16   Wt 220 lb (99.8 kg)   SpO2 94%   BMI 33.45 kg/m    Review of Systems  Constitutional: Positive for weight loss. Negative for fever.       Patient reports that he  has lost 30+ lbs since his recent illness/ hospitalization  Respiratory: Negative.  Negative for cough, sputum production, shortness of breath and wheezing.   Cardiovascular: Negative.  Negative for chest pain and leg swelling.  Gastrointestinal: Negative.  Negative for abdominal pain and nausea.  Genitourinary: Negative.   Musculoskeletal: Negative.  Negative for  falls.  Neurological: Negative.   Psychiatric/Behavioral: Negative.  Negative for depression. The patient is not nervous/anxious.     Physical Exam  Constitutional: He is oriented to person, place, and time. He appears well-developed and well-nourished.  Cardiovascular: Normal rate, regular rhythm, normal heart sounds and intact distal pulses.   Respiratory: Effort normal and breath sounds normal. No respiratory distress. He has no wheezes. He has no rales.  GI: Soft. Bowel sounds are normal.  Patient has colostomy which is C/D/I; wound vac is in place and dressing is C/D/I  Musculoskeletal: He exhibits no edema.  Neurological: He is alert and oriented to person, place, and time.  Skin: Skin is warm and dry.  Psychiatric: He has a normal mood and affect. His behavior is normal. Judgment and thought content normal.    Encounter Medications:   Outpatient Encounter Prescriptions as of 06/21/2016  Medication Sig Note  . aspirin 81 MG tablet Take 81 mg by mouth daily.     Marland Kitchen atenolol (TENORMIN) 50 MG tablet TAKE ONE & ONE-HALF TABLETS BY MOUTH ONCE DAILY (Patient taking differently: TAKE ONE & ONE-HALF TABLETS =75mg  BY MOUTH ONCE DAILY)   . atorvastatin (LIPITOR) 40 MG tablet TAKE ONE-HALF TABLET BY MOUTH ONCE DAILY (Patient taking differently: TAKE ONE-HALF TABLET= 20mg  BY MOUTH ONCE DAILY)   . brimonidine (ALPHAGAN) 0.2 % ophthalmic solution Place 1 drop into both eyes 2 (two) times daily.    . clopidogrel (PLAVIX) 75 MG tablet TAKE ONE TABLET BY MOUTH ONCE DAILY   . Coenzyme Q10 200 MG capsule Take 200 mg by mouth daily.   . fenofibrate 160 MG tablet Take 1 tablet (160 mg total) by mouth daily.   Marland Kitchen glimepiride (AMARYL) 2 MG tablet Take 1 tablet (2 mg total) by mouth daily with breakfast.   . Glucosamine-Chondroitin-Vit D3 1500-1200-800 MG-MG-UNIT PACK Take 1 tablet by mouth daily.   Marland Kitchen latanoprost (XALATAN) 0.005 % ophthalmic solution Place 1 drop into both eyes at bedtime.    Marland Kitchen lisinopril  (PRINIVIL,ZESTRIL) 2.5 MG tablet Take 1 tablet (2.5 mg total) by mouth daily.   . metFORMIN (GLUCOPHAGE) 1000 MG tablet Take 1 tablet (1,000 mg total) by mouth 2 (two) times daily with a meal.   . omega-3 acid ethyl esters (LOVAZA) 1 g capsule TAKE TWO CAPSULES BY MOUTH TWICE DAILY   . senna-docusate (SENOKOT-S) 8.6-50 MG tablet Take 1 tablet by mouth at bedtime as needed for mild constipation.   . furosemide (LASIX) 20 MG tablet Take 1 tablet (20 mg total) by mouth daily as needed for fluid or edema. (Patient not taking: Reported on 06/21/2016) 06/21/2016: Not currently taking  . Oxycodone HCl 10 MG TABS Take 1 tablet (10 mg total) by mouth 3 (three) times daily as needed. (Patient not taking: Reported on 06/21/2016) 06/21/2016: Patient has not needed   No facility-administered encounter medications on file as of 06/21/2016.     Functional Status:   In your present state of health, do you have any difficulty performing the following activities: 05/16/2016 05/16/2016  Hearing? - N  Vision? - N  Difficulty concentrating or making decisions? - N  Walking or climbing stairs? - Y  Dressing or bathing? - N  Doing errands, shopping? N -  Some recent data might be hidden    Fall/Depression Screening:    PHQ 2/9 Scores 06/21/2016 10/22/2015 10/07/2014 09/17/2013  PHQ - 2 Score 1 0 0 0    Assessment:  Evan Avery has continued recuperating well after his recent hospitalization, but remains frustrated with his wound vac and his new colostomy.  Evan Avery and his wife Evan Avery would like additional information of self-management of his colostomy.  Evan Avery has an excellent support system through his wife and family and is feels ready to get back to his regular routine and daily life.  Evan Avery is committed to doing everything he needs to in an effort to fully recuperate from his recent illness and hospitalization.    Plan:   Mr. Solomons will take his medications as prescribed and will attend all scheduled provider  appointments.  Mr. Hipple will work with [facility] services as ordered post-discharge from [place] visit.  This [facility] RN CM will reach out to [facility] RN to obtain resources and information for patient regarding the ongoing care of his new colostomy.  Mr. or Mrs. Winiarski will contact patient's providers promptly for any concerns, questions, problems or needs that arise prior to scheduled provider appointments.  Continued [facility] CM outreach for transition of care; with scheduled telephone outreach next week and next in-home visit next month.   I appreciate the opportunity to participate in Evan Avery's care,  [name], RN, BSN, [place] Coordinator [facility] Care Management  [phone]

## 2016-06-22 ENCOUNTER — Other Ambulatory Visit: Payer: Self-pay | Admitting: *Deleted

## 2016-06-22 NOTE — Patient Outreach (Signed)
[facility]) Care Management [place] Care Coordination outreach 06/22/2016  [name] 03-10-1946 [id number]  "Evan Avery is a 70 y.o. 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 Patient was treated with antibiotics, TPN, and wound vacuum. Patient had post-op ileus, and has additional/ concurrent diagnosis of HTN, DM, CAD with stent, hyperlipidemia, obesity, and OSA requiring CPAP. Patient was discharged home with home health [facility]) services.   Successful care coordination outreach to Maudie Flakes, RN WOC, who worked with patient Ry Kham during IP hospital visit for colostomy care and education.  Margarita Grizzle shared that prior to his discharge from the hospital, patient was connected to [facility] program, which assists patient with initial colostomy supplies and instruction on new colostomy care, and provides 3 free calls from an ostomy nurse.  Margarita Grizzle will connect with [place] to coordinate un-vented bags be sent to patient, and to obtain information on success of [place] call attempts to patient thus far.  Margarita Grizzle also shared [facility]) [place] [facility]) has an Multimedia programmer available to their active patients, who will work with patients primary [place] RN to provide instruction to patient and his family.  Another option available to patient is through [facility] who will assist patient with changing of colostomy bags for a fee.   Plan:  Margarita Grizzle will contact [employer] program to have additional un-vented bags mailed to patient and obtain information regarding calls placed thus far to patient from [place]  I will contact patient  to share information obtained today from Margarita Grizzle re: options available to patient for additional instruction/ education on care of colostomy now that patient is at home.   [name], RN, BSN, [employer] [facility] Care Management  [phone]

## 2016-06-23 ENCOUNTER — Other Ambulatory Visit: Payer: Self-pay | Admitting: *Deleted

## 2016-06-23 DIAGNOSIS — A419 Sepsis, unspecified organism: Secondary | ICD-10-CM | POA: Diagnosis not present

## 2016-06-23 DIAGNOSIS — E785 Hyperlipidemia, unspecified: Secondary | ICD-10-CM | POA: Diagnosis not present

## 2016-06-23 DIAGNOSIS — Z7984 Long term (current) use of oral hypoglycemic drugs: Secondary | ICD-10-CM | POA: Diagnosis not present

## 2016-06-23 DIAGNOSIS — Z6835 Body mass index (BMI) 35.0-35.9, adult: Secondary | ICD-10-CM | POA: Diagnosis not present

## 2016-06-23 DIAGNOSIS — E669 Obesity, unspecified: Secondary | ICD-10-CM | POA: Diagnosis not present

## 2016-06-23 DIAGNOSIS — I10 Essential (primary) hypertension: Secondary | ICD-10-CM | POA: Diagnosis not present

## 2016-06-23 DIAGNOSIS — Z48815 Encounter for surgical aftercare following surgery on the digestive system: Secondary | ICD-10-CM | POA: Diagnosis not present

## 2016-06-23 DIAGNOSIS — G4733 Obstructive sleep apnea (adult) (pediatric): Secondary | ICD-10-CM | POA: Diagnosis not present

## 2016-06-23 DIAGNOSIS — Z7901 Long term (current) use of anticoagulants: Secondary | ICD-10-CM | POA: Diagnosis not present

## 2016-06-23 DIAGNOSIS — E1165 Type 2 diabetes mellitus with hyperglycemia: Secondary | ICD-10-CM | POA: Diagnosis not present

## 2016-06-23 DIAGNOSIS — Z7982 Long term (current) use of aspirin: Secondary | ICD-10-CM | POA: Diagnosis not present

## 2016-06-23 DIAGNOSIS — Z5181 Encounter for therapeutic drug level monitoring: Secondary | ICD-10-CM | POA: Diagnosis not present

## 2016-06-23 DIAGNOSIS — Z452 Encounter for adjustment and management of vascular access device: Secondary | ICD-10-CM | POA: Diagnosis not present

## 2016-06-23 DIAGNOSIS — Z792 Long term (current) use of antibiotics: Secondary | ICD-10-CM | POA: Diagnosis not present

## 2016-06-23 DIAGNOSIS — I2510 Atherosclerotic heart disease of native coronary artery without angina pectoris: Secondary | ICD-10-CM | POA: Diagnosis not present

## 2016-06-23 NOTE — Patient Outreach (Signed)
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 Patient was treated with antibiotics, TPN, and wound vacuum. Patient had post-op ileus, and has additional/ concurrent diagnosis of HTN, DM, CAD with stent, hyperlipidemia, obesity, and OSA requiring CPAP. Patient was discharged home with home health [facility]) services. HIPAA verified.  Made patient aware that I had spoken with Evan Flakes, RN WOC, who worked with patient during [place] hospital visit for information on patient resources for colostomy care and education, and made him aware of Evan options available for patient that Evan Avery shared with me.  Evan Avery confirmed that he and his wife had spoken with team at [employer] program to have their questions answered and also confirmed that he had spoken to his insurance Avery to arrange obtaining his colostomy bags.  Evan Avery stated that his [facility] RN "Evan Avery" is on his way over to visit him now and that Evan plan moving forward is for Evan Avery's wife Evan Avery to actually change Evan colostomy bag, while [facility] RN Evan Avery; I provided positive reinforcement and encouragement and expressed agreement with this plan.    Evan Avery denies further concerns, needs, issues or problems today.      Plan:  Evan Avery will take his medications as prescribed and will attend all scheduled provider appointments.  Evan Avery will work with [facility] services as ordered post-discharge from [place] visit.  [name] will begin changing patient's colostomy bag under Evan supervision of [facility] RN.  Evan Avery will contact patient's providers promptly for any concerns, questions, problems or needs that arise prior to scheduled provider appointments.  Continued [facility] CM outreach for transition of care; with scheduled telephone outreach next week and next in-home visit next month.   [name], RN, BSN, [employer] [facility] Care Management  [phone]

## 2016-06-26 DIAGNOSIS — E1165 Type 2 diabetes mellitus with hyperglycemia: Secondary | ICD-10-CM | POA: Diagnosis not present

## 2016-06-26 DIAGNOSIS — E785 Hyperlipidemia, unspecified: Secondary | ICD-10-CM | POA: Diagnosis not present

## 2016-06-26 DIAGNOSIS — Z7982 Long term (current) use of aspirin: Secondary | ICD-10-CM | POA: Diagnosis not present

## 2016-06-26 DIAGNOSIS — G4733 Obstructive sleep apnea (adult) (pediatric): Secondary | ICD-10-CM | POA: Diagnosis not present

## 2016-06-26 DIAGNOSIS — E669 Obesity, unspecified: Secondary | ICD-10-CM | POA: Diagnosis not present

## 2016-06-26 DIAGNOSIS — Z792 Long term (current) use of antibiotics: Secondary | ICD-10-CM | POA: Diagnosis not present

## 2016-06-26 DIAGNOSIS — Z7901 Long term (current) use of anticoagulants: Secondary | ICD-10-CM | POA: Diagnosis not present

## 2016-06-26 DIAGNOSIS — Z6835 Body mass index (BMI) 35.0-35.9, adult: Secondary | ICD-10-CM | POA: Diagnosis not present

## 2016-06-26 DIAGNOSIS — Z7984 Long term (current) use of oral hypoglycemic drugs: Secondary | ICD-10-CM | POA: Diagnosis not present

## 2016-06-26 DIAGNOSIS — I10 Essential (primary) hypertension: Secondary | ICD-10-CM | POA: Diagnosis not present

## 2016-06-26 DIAGNOSIS — Z48815 Encounter for surgical aftercare following surgery on the digestive system: Secondary | ICD-10-CM | POA: Diagnosis not present

## 2016-06-26 DIAGNOSIS — A419 Sepsis, unspecified organism: Secondary | ICD-10-CM | POA: Diagnosis not present

## 2016-06-26 DIAGNOSIS — Z5181 Encounter for therapeutic drug level monitoring: Secondary | ICD-10-CM | POA: Diagnosis not present

## 2016-06-26 DIAGNOSIS — I2510 Atherosclerotic heart disease of native coronary artery without angina pectoris: Secondary | ICD-10-CM | POA: Diagnosis not present

## 2016-06-26 DIAGNOSIS — Z452 Encounter for adjustment and management of vascular access device: Secondary | ICD-10-CM | POA: Diagnosis not present

## 2016-06-28 DIAGNOSIS — Z7982 Long term (current) use of aspirin: Secondary | ICD-10-CM | POA: Diagnosis not present

## 2016-06-28 DIAGNOSIS — Z452 Encounter for adjustment and management of vascular access device: Secondary | ICD-10-CM | POA: Diagnosis not present

## 2016-06-28 DIAGNOSIS — I10 Essential (primary) hypertension: Secondary | ICD-10-CM | POA: Diagnosis not present

## 2016-06-28 DIAGNOSIS — Z48815 Encounter for surgical aftercare following surgery on the digestive system: Secondary | ICD-10-CM | POA: Diagnosis not present

## 2016-06-28 DIAGNOSIS — Z7901 Long term (current) use of anticoagulants: Secondary | ICD-10-CM | POA: Diagnosis not present

## 2016-06-28 DIAGNOSIS — E785 Hyperlipidemia, unspecified: Secondary | ICD-10-CM | POA: Diagnosis not present

## 2016-06-28 DIAGNOSIS — Z5181 Encounter for therapeutic drug level monitoring: Secondary | ICD-10-CM | POA: Diagnosis not present

## 2016-06-28 DIAGNOSIS — I2510 Atherosclerotic heart disease of native coronary artery without angina pectoris: Secondary | ICD-10-CM | POA: Diagnosis not present

## 2016-06-28 DIAGNOSIS — E669 Obesity, unspecified: Secondary | ICD-10-CM | POA: Diagnosis not present

## 2016-06-28 DIAGNOSIS — G4733 Obstructive sleep apnea (adult) (pediatric): Secondary | ICD-10-CM | POA: Diagnosis not present

## 2016-06-28 DIAGNOSIS — Z6835 Body mass index (BMI) 35.0-35.9, adult: Secondary | ICD-10-CM | POA: Diagnosis not present

## 2016-06-28 DIAGNOSIS — E1165 Type 2 diabetes mellitus with hyperglycemia: Secondary | ICD-10-CM | POA: Diagnosis not present

## 2016-06-28 DIAGNOSIS — Z792 Long term (current) use of antibiotics: Secondary | ICD-10-CM | POA: Diagnosis not present

## 2016-06-28 DIAGNOSIS — A419 Sepsis, unspecified organism: Secondary | ICD-10-CM | POA: Diagnosis not present

## 2016-06-28 DIAGNOSIS — Z7984 Long term (current) use of oral hypoglycemic drugs: Secondary | ICD-10-CM | POA: Diagnosis not present

## 2016-06-29 ENCOUNTER — Other Ambulatory Visit: Payer: Self-pay | Admitting: *Deleted

## 2016-06-29 ENCOUNTER — Encounter: Payer: Self-pay | Admitting: *Deleted

## 2016-06-29 DIAGNOSIS — A419 Sepsis, unspecified organism: Secondary | ICD-10-CM | POA: Diagnosis not present

## 2016-06-29 DIAGNOSIS — Z48815 Encounter for surgical aftercare following surgery on the digestive system: Secondary | ICD-10-CM | POA: Diagnosis not present

## 2016-06-29 DIAGNOSIS — Z792 Long term (current) use of antibiotics: Secondary | ICD-10-CM | POA: Diagnosis not present

## 2016-06-29 DIAGNOSIS — Z6835 Body mass index (BMI) 35.0-35.9, adult: Secondary | ICD-10-CM | POA: Diagnosis not present

## 2016-06-29 DIAGNOSIS — Z452 Encounter for adjustment and management of vascular access device: Secondary | ICD-10-CM | POA: Diagnosis not present

## 2016-06-29 DIAGNOSIS — Z5181 Encounter for therapeutic drug level monitoring: Secondary | ICD-10-CM | POA: Diagnosis not present

## 2016-06-29 DIAGNOSIS — Z7901 Long term (current) use of anticoagulants: Secondary | ICD-10-CM | POA: Diagnosis not present

## 2016-06-29 NOTE — Patient Outreach (Signed)
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 Patient was treated with antibiotics, TPN, and wound vacuum. Patient had post-op ileus, and has additional/ concurrent diagnosis of HTN, DM, CAD with stent, hyperlipidemia, obesity, and OSA requiring CPAP. Patient was discharged home with home health [facility]) services. HIPAA/ identity verified.  Clair Gulling reports today that he continues to do well; patient reports that his wife has continued communicating with [employer] staff to make sure he has everything he needs for his new colostomy, and they have started changing the colostomy bags themselves.   Clair Gulling states that his "regular" [place] has "quit" [facility]), and a new nurse is coming to care for the wound vac; Clair Gulling expresses disappointment around this, as "Elta Guadeloupe was so knowledgeable."  I provided Clair Gulling with the contact information for the [facility] RN, should he wish to have additional information/ support around care of his new colostomy.  Clair Gulling states that his wound continues to shrink with the wound vac in place, and that he will be attending a provider appointment tomorrow with Dr. [name].  Clair Gulling reports that he has continued to take his medications as they are prescribed and denies pain today.  He states that he believes things are "levelling out," with his post-hospital discharge care and verbalizes that he is becoming more confident with his new colostomy.  Clair Gulling denies further  concerns, needs, issues or problems today.  I let Clair Gulling know that another [facility] RN CM would be calling next week, and we confirmed our previously scheduled in-home visit for later this month.   Plan:  Mr. Lindemann will take his medications as prescribed and will attend all scheduled provider appointments.  Mr. Abella will work with [facility] services as ordered post-discharge from [place] visit.  Mr. and Mrs. Cova will continue changing patient's colostomy bag with the supervision of [facility] RN, if needed.  Mr. or Mrs. Misko will contact patient's providers promptly for any concerns, questions, problems or needs that arise prior to scheduled provider appointments.  [place] telephone outreach for transition of care next week and in-home visit later this month.  [name], RN, BSN, [employer] [facility] Care Management  [phone]

## 2016-06-30 DIAGNOSIS — Z792 Long term (current) use of antibiotics: Secondary | ICD-10-CM | POA: Diagnosis not present

## 2016-06-30 DIAGNOSIS — Z452 Encounter for adjustment and management of vascular access device: Secondary | ICD-10-CM | POA: Diagnosis not present

## 2016-06-30 DIAGNOSIS — Z6835 Body mass index (BMI) 35.0-35.9, adult: Secondary | ICD-10-CM | POA: Diagnosis not present

## 2016-06-30 DIAGNOSIS — Z7982 Long term (current) use of aspirin: Secondary | ICD-10-CM | POA: Diagnosis not present

## 2016-06-30 DIAGNOSIS — A419 Sepsis, unspecified organism: Secondary | ICD-10-CM | POA: Diagnosis not present

## 2016-06-30 DIAGNOSIS — E669 Obesity, unspecified: Secondary | ICD-10-CM | POA: Diagnosis not present

## 2016-06-30 DIAGNOSIS — E785 Hyperlipidemia, unspecified: Secondary | ICD-10-CM | POA: Diagnosis not present

## 2016-06-30 DIAGNOSIS — Z5181 Encounter for therapeutic drug level monitoring: Secondary | ICD-10-CM | POA: Diagnosis not present

## 2016-06-30 DIAGNOSIS — Z48815 Encounter for surgical aftercare following surgery on the digestive system: Secondary | ICD-10-CM | POA: Diagnosis not present

## 2016-06-30 DIAGNOSIS — E1165 Type 2 diabetes mellitus with hyperglycemia: Secondary | ICD-10-CM | POA: Diagnosis not present

## 2016-06-30 DIAGNOSIS — Z7984 Long term (current) use of oral hypoglycemic drugs: Secondary | ICD-10-CM | POA: Diagnosis not present

## 2016-06-30 DIAGNOSIS — I2510 Atherosclerotic heart disease of native coronary artery without angina pectoris: Secondary | ICD-10-CM | POA: Diagnosis not present

## 2016-06-30 DIAGNOSIS — I10 Essential (primary) hypertension: Secondary | ICD-10-CM | POA: Diagnosis not present

## 2016-06-30 DIAGNOSIS — Z7901 Long term (current) use of anticoagulants: Secondary | ICD-10-CM | POA: Diagnosis not present

## 2016-06-30 DIAGNOSIS — G4733 Obstructive sleep apnea (adult) (pediatric): Secondary | ICD-10-CM | POA: Diagnosis not present

## 2016-07-02 DIAGNOSIS — T8189XA Other complications of procedures, not elsewhere classified, initial encounter: Secondary | ICD-10-CM | POA: Diagnosis not present

## 2016-07-02 DIAGNOSIS — K5720 Diverticulitis of large intestine with perforation and abscess without bleeding: Secondary | ICD-10-CM | POA: Diagnosis not present

## 2016-07-02 DIAGNOSIS — G4733 Obstructive sleep apnea (adult) (pediatric): Secondary | ICD-10-CM | POA: Diagnosis not present

## 2016-07-04 ENCOUNTER — Other Ambulatory Visit: Payer: Self-pay

## 2016-07-04 NOTE — Patient Outreach (Signed)
[facility]) Care Management  07/04/2016  Evan Avery [birth year] [id number]   69 year old with recent admission due to sepsis/diverticulitis with colon perforation/colon abcessess/Status post colostomy. Member reports he saw the surgeon yesterday and his wound is healing well. Mr. Boulos reports the wound vac was removed on yesterday and he is doing a wet to dry dressing change now. Member reports a follow up on 9/25.   No issues or concerns at this time.  Home health nurse to see member tomorrow.  Plan: update assigned RNCM.  Covering RNCM: Thea Silversmith, RN, MSN, [place] Coordinator Cell: [phone]

## 2016-07-05 DIAGNOSIS — Z7901 Long term (current) use of anticoagulants: Secondary | ICD-10-CM | POA: Diagnosis not present

## 2016-07-05 DIAGNOSIS — A419 Sepsis, unspecified organism: Secondary | ICD-10-CM | POA: Diagnosis not present

## 2016-07-05 DIAGNOSIS — G4733 Obstructive sleep apnea (adult) (pediatric): Secondary | ICD-10-CM | POA: Diagnosis not present

## 2016-07-05 DIAGNOSIS — E669 Obesity, unspecified: Secondary | ICD-10-CM | POA: Diagnosis not present

## 2016-07-05 DIAGNOSIS — Z48815 Encounter for surgical aftercare following surgery on the digestive system: Secondary | ICD-10-CM | POA: Diagnosis not present

## 2016-07-05 DIAGNOSIS — E1165 Type 2 diabetes mellitus with hyperglycemia: Secondary | ICD-10-CM | POA: Diagnosis not present

## 2016-07-05 DIAGNOSIS — Z6835 Body mass index (BMI) 35.0-35.9, adult: Secondary | ICD-10-CM | POA: Diagnosis not present

## 2016-07-05 DIAGNOSIS — Z7982 Long term (current) use of aspirin: Secondary | ICD-10-CM | POA: Diagnosis not present

## 2016-07-05 DIAGNOSIS — I2510 Atherosclerotic heart disease of native coronary artery without angina pectoris: Secondary | ICD-10-CM | POA: Diagnosis not present

## 2016-07-05 DIAGNOSIS — Z792 Long term (current) use of antibiotics: Secondary | ICD-10-CM | POA: Diagnosis not present

## 2016-07-05 DIAGNOSIS — Z5181 Encounter for therapeutic drug level monitoring: Secondary | ICD-10-CM | POA: Diagnosis not present

## 2016-07-05 DIAGNOSIS — E785 Hyperlipidemia, unspecified: Secondary | ICD-10-CM | POA: Diagnosis not present

## 2016-07-05 DIAGNOSIS — Z452 Encounter for adjustment and management of vascular access device: Secondary | ICD-10-CM | POA: Diagnosis not present

## 2016-07-05 DIAGNOSIS — Z7984 Long term (current) use of oral hypoglycemic drugs: Secondary | ICD-10-CM | POA: Diagnosis not present

## 2016-07-05 DIAGNOSIS — I10 Essential (primary) hypertension: Secondary | ICD-10-CM | POA: Diagnosis not present

## 2016-07-07 DIAGNOSIS — E1165 Type 2 diabetes mellitus with hyperglycemia: Secondary | ICD-10-CM | POA: Diagnosis not present

## 2016-07-07 DIAGNOSIS — Z452 Encounter for adjustment and management of vascular access device: Secondary | ICD-10-CM | POA: Diagnosis not present

## 2016-07-07 DIAGNOSIS — E785 Hyperlipidemia, unspecified: Secondary | ICD-10-CM | POA: Diagnosis not present

## 2016-07-07 DIAGNOSIS — A419 Sepsis, unspecified organism: Secondary | ICD-10-CM | POA: Diagnosis not present

## 2016-07-07 DIAGNOSIS — Z7901 Long term (current) use of anticoagulants: Secondary | ICD-10-CM | POA: Diagnosis not present

## 2016-07-07 DIAGNOSIS — Z5181 Encounter for therapeutic drug level monitoring: Secondary | ICD-10-CM | POA: Diagnosis not present

## 2016-07-07 DIAGNOSIS — Z7984 Long term (current) use of oral hypoglycemic drugs: Secondary | ICD-10-CM | POA: Diagnosis not present

## 2016-07-07 DIAGNOSIS — I2510 Atherosclerotic heart disease of native coronary artery without angina pectoris: Secondary | ICD-10-CM | POA: Diagnosis not present

## 2016-07-07 DIAGNOSIS — Z792 Long term (current) use of antibiotics: Secondary | ICD-10-CM | POA: Diagnosis not present

## 2016-07-07 DIAGNOSIS — Z6835 Body mass index (BMI) 35.0-35.9, adult: Secondary | ICD-10-CM | POA: Diagnosis not present

## 2016-07-07 DIAGNOSIS — G4733 Obstructive sleep apnea (adult) (pediatric): Secondary | ICD-10-CM | POA: Diagnosis not present

## 2016-07-07 DIAGNOSIS — Z48815 Encounter for surgical aftercare following surgery on the digestive system: Secondary | ICD-10-CM | POA: Diagnosis not present

## 2016-07-07 DIAGNOSIS — E669 Obesity, unspecified: Secondary | ICD-10-CM | POA: Diagnosis not present

## 2016-07-07 DIAGNOSIS — I10 Essential (primary) hypertension: Secondary | ICD-10-CM | POA: Diagnosis not present

## 2016-07-07 DIAGNOSIS — Z7982 Long term (current) use of aspirin: Secondary | ICD-10-CM | POA: Diagnosis not present

## 2016-07-10 DIAGNOSIS — Z792 Long term (current) use of antibiotics: Secondary | ICD-10-CM | POA: Diagnosis not present

## 2016-07-10 DIAGNOSIS — G4733 Obstructive sleep apnea (adult) (pediatric): Secondary | ICD-10-CM | POA: Diagnosis not present

## 2016-07-10 DIAGNOSIS — E1165 Type 2 diabetes mellitus with hyperglycemia: Secondary | ICD-10-CM | POA: Diagnosis not present

## 2016-07-10 DIAGNOSIS — Z452 Encounter for adjustment and management of vascular access device: Secondary | ICD-10-CM | POA: Diagnosis not present

## 2016-07-10 DIAGNOSIS — Z7984 Long term (current) use of oral hypoglycemic drugs: Secondary | ICD-10-CM | POA: Diagnosis not present

## 2016-07-10 DIAGNOSIS — A419 Sepsis, unspecified organism: Secondary | ICD-10-CM | POA: Diagnosis not present

## 2016-07-10 DIAGNOSIS — E785 Hyperlipidemia, unspecified: Secondary | ICD-10-CM | POA: Diagnosis not present

## 2016-07-10 DIAGNOSIS — Z7982 Long term (current) use of aspirin: Secondary | ICD-10-CM | POA: Diagnosis not present

## 2016-07-10 DIAGNOSIS — Z48815 Encounter for surgical aftercare following surgery on the digestive system: Secondary | ICD-10-CM | POA: Diagnosis not present

## 2016-07-10 DIAGNOSIS — Z5181 Encounter for therapeutic drug level monitoring: Secondary | ICD-10-CM | POA: Diagnosis not present

## 2016-07-10 DIAGNOSIS — I2510 Atherosclerotic heart disease of native coronary artery without angina pectoris: Secondary | ICD-10-CM | POA: Diagnosis not present

## 2016-07-10 DIAGNOSIS — Z7901 Long term (current) use of anticoagulants: Secondary | ICD-10-CM | POA: Diagnosis not present

## 2016-07-10 DIAGNOSIS — Z6835 Body mass index (BMI) 35.0-35.9, adult: Secondary | ICD-10-CM | POA: Diagnosis not present

## 2016-07-10 DIAGNOSIS — E669 Obesity, unspecified: Secondary | ICD-10-CM | POA: Diagnosis not present

## 2016-07-10 DIAGNOSIS — I10 Essential (primary) hypertension: Secondary | ICD-10-CM | POA: Diagnosis not present

## 2016-07-12 DIAGNOSIS — I10 Essential (primary) hypertension: Secondary | ICD-10-CM | POA: Diagnosis not present

## 2016-07-12 DIAGNOSIS — Z7901 Long term (current) use of anticoagulants: Secondary | ICD-10-CM | POA: Diagnosis not present

## 2016-07-12 DIAGNOSIS — Z452 Encounter for adjustment and management of vascular access device: Secondary | ICD-10-CM | POA: Diagnosis not present

## 2016-07-12 DIAGNOSIS — Z7984 Long term (current) use of oral hypoglycemic drugs: Secondary | ICD-10-CM | POA: Diagnosis not present

## 2016-07-12 DIAGNOSIS — Z7982 Long term (current) use of aspirin: Secondary | ICD-10-CM | POA: Diagnosis not present

## 2016-07-12 DIAGNOSIS — E1165 Type 2 diabetes mellitus with hyperglycemia: Secondary | ICD-10-CM | POA: Diagnosis not present

## 2016-07-12 DIAGNOSIS — G4733 Obstructive sleep apnea (adult) (pediatric): Secondary | ICD-10-CM | POA: Diagnosis not present

## 2016-07-12 DIAGNOSIS — Z6835 Body mass index (BMI) 35.0-35.9, adult: Secondary | ICD-10-CM | POA: Diagnosis not present

## 2016-07-12 DIAGNOSIS — Z48815 Encounter for surgical aftercare following surgery on the digestive system: Secondary | ICD-10-CM | POA: Diagnosis not present

## 2016-07-12 DIAGNOSIS — E669 Obesity, unspecified: Secondary | ICD-10-CM | POA: Diagnosis not present

## 2016-07-12 DIAGNOSIS — Z5181 Encounter for therapeutic drug level monitoring: Secondary | ICD-10-CM | POA: Diagnosis not present

## 2016-07-12 DIAGNOSIS — Z792 Long term (current) use of antibiotics: Secondary | ICD-10-CM | POA: Diagnosis not present

## 2016-07-12 DIAGNOSIS — E785 Hyperlipidemia, unspecified: Secondary | ICD-10-CM | POA: Diagnosis not present

## 2016-07-12 DIAGNOSIS — I2510 Atherosclerotic heart disease of native coronary artery without angina pectoris: Secondary | ICD-10-CM | POA: Diagnosis not present

## 2016-07-12 DIAGNOSIS — A419 Sepsis, unspecified organism: Secondary | ICD-10-CM | POA: Diagnosis not present

## 2016-07-14 DIAGNOSIS — Z48815 Encounter for surgical aftercare following surgery on the digestive system: Secondary | ICD-10-CM | POA: Diagnosis not present

## 2016-07-14 DIAGNOSIS — E785 Hyperlipidemia, unspecified: Secondary | ICD-10-CM | POA: Diagnosis not present

## 2016-07-14 DIAGNOSIS — I2510 Atherosclerotic heart disease of native coronary artery without angina pectoris: Secondary | ICD-10-CM | POA: Diagnosis not present

## 2016-07-14 DIAGNOSIS — I10 Essential (primary) hypertension: Secondary | ICD-10-CM | POA: Diagnosis not present

## 2016-07-14 DIAGNOSIS — Z7982 Long term (current) use of aspirin: Secondary | ICD-10-CM | POA: Diagnosis not present

## 2016-07-14 DIAGNOSIS — A419 Sepsis, unspecified organism: Secondary | ICD-10-CM | POA: Diagnosis not present

## 2016-07-14 DIAGNOSIS — Z5181 Encounter for therapeutic drug level monitoring: Secondary | ICD-10-CM | POA: Diagnosis not present

## 2016-07-14 DIAGNOSIS — E1165 Type 2 diabetes mellitus with hyperglycemia: Secondary | ICD-10-CM | POA: Diagnosis not present

## 2016-07-14 DIAGNOSIS — E669 Obesity, unspecified: Secondary | ICD-10-CM | POA: Diagnosis not present

## 2016-07-14 DIAGNOSIS — Z6835 Body mass index (BMI) 35.0-35.9, adult: Secondary | ICD-10-CM | POA: Diagnosis not present

## 2016-07-14 DIAGNOSIS — Z7901 Long term (current) use of anticoagulants: Secondary | ICD-10-CM | POA: Diagnosis not present

## 2016-07-14 DIAGNOSIS — Z7984 Long term (current) use of oral hypoglycemic drugs: Secondary | ICD-10-CM | POA: Diagnosis not present

## 2016-07-14 DIAGNOSIS — Z792 Long term (current) use of antibiotics: Secondary | ICD-10-CM | POA: Diagnosis not present

## 2016-07-14 DIAGNOSIS — Z452 Encounter for adjustment and management of vascular access device: Secondary | ICD-10-CM | POA: Diagnosis not present

## 2016-07-14 DIAGNOSIS — G4733 Obstructive sleep apnea (adult) (pediatric): Secondary | ICD-10-CM | POA: Diagnosis not present

## 2016-07-17 DIAGNOSIS — Z48815 Encounter for surgical aftercare following surgery on the digestive system: Secondary | ICD-10-CM | POA: Diagnosis not present

## 2016-07-17 DIAGNOSIS — I10 Essential (primary) hypertension: Secondary | ICD-10-CM | POA: Diagnosis not present

## 2016-07-17 DIAGNOSIS — E1165 Type 2 diabetes mellitus with hyperglycemia: Secondary | ICD-10-CM | POA: Diagnosis not present

## 2016-07-17 DIAGNOSIS — E785 Hyperlipidemia, unspecified: Secondary | ICD-10-CM | POA: Diagnosis not present

## 2016-07-17 DIAGNOSIS — Z6835 Body mass index (BMI) 35.0-35.9, adult: Secondary | ICD-10-CM | POA: Diagnosis not present

## 2016-07-17 DIAGNOSIS — Z792 Long term (current) use of antibiotics: Secondary | ICD-10-CM | POA: Diagnosis not present

## 2016-07-17 DIAGNOSIS — Z7901 Long term (current) use of anticoagulants: Secondary | ICD-10-CM | POA: Diagnosis not present

## 2016-07-17 DIAGNOSIS — Z452 Encounter for adjustment and management of vascular access device: Secondary | ICD-10-CM | POA: Diagnosis not present

## 2016-07-17 DIAGNOSIS — A419 Sepsis, unspecified organism: Secondary | ICD-10-CM | POA: Diagnosis not present

## 2016-07-17 DIAGNOSIS — Z5181 Encounter for therapeutic drug level monitoring: Secondary | ICD-10-CM | POA: Diagnosis not present

## 2016-07-17 DIAGNOSIS — G4733 Obstructive sleep apnea (adult) (pediatric): Secondary | ICD-10-CM | POA: Diagnosis not present

## 2016-07-17 DIAGNOSIS — E669 Obesity, unspecified: Secondary | ICD-10-CM | POA: Diagnosis not present

## 2016-07-17 DIAGNOSIS — Z7982 Long term (current) use of aspirin: Secondary | ICD-10-CM | POA: Diagnosis not present

## 2016-07-17 DIAGNOSIS — Z7984 Long term (current) use of oral hypoglycemic drugs: Secondary | ICD-10-CM | POA: Diagnosis not present

## 2016-07-17 DIAGNOSIS — I2510 Atherosclerotic heart disease of native coronary artery without angina pectoris: Secondary | ICD-10-CM | POA: Diagnosis not present

## 2016-07-18 ENCOUNTER — Telehealth: Payer: Self-pay | Admitting: Family Medicine

## 2016-07-18 DIAGNOSIS — Z933 Colostomy status: Secondary | ICD-10-CM | POA: Diagnosis not present

## 2016-07-18 DIAGNOSIS — Z77098 Contact with and (suspected) exposure to other hazardous, chiefly nonmedicinal, chemicals: Secondary | ICD-10-CM

## 2016-07-18 DIAGNOSIS — K5720 Diverticulitis of large intestine with perforation and abscess without bleeding: Secondary | ICD-10-CM | POA: Diagnosis not present

## 2016-07-18 NOTE — Telephone Encounter (Signed)
In your IN box for completion.  

## 2016-07-18 NOTE — Telephone Encounter (Signed)
Pt dropped off forms to be filled out for [employer] disability. Please call when complete. I placed in Rx tower.

## 2016-07-19 ENCOUNTER — Encounter: Payer: Self-pay | Admitting: *Deleted

## 2016-07-19 ENCOUNTER — Other Ambulatory Visit: Payer: Self-pay | Admitting: *Deleted

## 2016-07-19 NOTE — Patient Outreach (Signed)
[facility]) Care Management  [place] CM Routine Home Visit, for [place] discharge 07/19/2016  COHEN BOETTNER 1946-05-15 [id number]  KHAMARI SHEEHAN is an 70 y.o. 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 Patient was treated with antibiotics, TPN, and wound vacuum. Patient had post-op ileus, and has additional/ concurrent diagnosis of HTN, DM, CAD with stent, hyperlipidemia, obesity, and OSA requiring CPAP. Patient was discharged home with home health [facility]) services which have now ended.  Today, Clair Gulling reports that he is doing much better and has begun independently managing his abdominal wound/ colostomy care, with the assistance of his wife when necessary.  Clair Gulling reports that he has seen his surgeon since our last [place] home visit, and "got a good report."  Clair Gulling reports that he continues taking his medications as they are prescribed.  We discussed self-health management of chronic disease state of DM, and jim verbalizes a good understanding of his overall plan of care and A1-C trends.  He reports an accurate report of upcoming provider appointments.  Clair Gulling denies pain, discomfort, needs or concerns today, and we agreed that he has successfully met his previously established [place] CM goals and is ready for discharge from [place] program.  Subjective: "I am doing fine taking care of my colostomy on my own.  Things took a long time, but I am feeling good about everything now."  Objective:  BP 132/64   Pulse 62   Resp 16   Wt 222 lb (100.7 kg)   SpO2 95%   BMI 33.75 kg/m      Review of Systems  Constitutional: Negative.  Negative for fever and malaise/fatigue.  Respiratory:  Negative.  Negative for cough, sputum production, shortness of breath and wheezing.   Cardiovascular: Negative.  Negative for chest pain and leg swelling.  Gastrointestinal: Negative.  Negative for abdominal pain, nausea and vomiting.  Genitourinary: Negative.   Musculoskeletal: Negative.   Skin: Negative.   Neurological: Negative.  Negative for weakness.  Psychiatric/Behavioral: Negative.  The patient is not nervous/anxious.     Physical Exam  Constitutional: He is oriented to person, place, and time. He appears well-developed and well-nourished.  Cardiovascular: Normal rate, regular rhythm, normal heart sounds and intact distal pulses.   Pulses:      Radial pulses are 2+ on the right side, and 2+ on the left side.       Dorsalis pedis pulses are 2+ on the right side, and 2+ on the left side.  Respiratory: Effort normal and breath sounds normal. No respiratory distress. He has no wheezes. He has no rales.  GI: Soft. Bowel sounds are normal.  Healing abdominal wound is C/D/I  Musculoskeletal: He exhibits no edema.  Neurological: He is alert and oriented to person, place, and time.  Skin: Skin is warm and dry.  Psychiatric: He has a normal mood and affect. His behavior is normal. Judgment and thought content normal.    Encounter Medications:   Outpatient Encounter Prescriptions as of 07/19/2016  Medication Sig Note  . aspirin 81 MG tablet Take 81 mg by mouth daily.     Marland Kitchen atenolol (TENORMIN) 50 MG tablet TAKE ONE & ONE-HALF TABLETS BY MOUTH ONCE DAILY (Patient taking differently: TAKE ONE & ONE-HALF  TABLETS =74m BY MOUTH ONCE DAILY)   . atorvastatin (LIPITOR) 40 MG tablet TAKE ONE-HALF TABLET BY MOUTH ONCE DAILY (Patient taking differently: TAKE ONE-HALF TABLET= 2107mBY MOUTH ONCE DAILY)   . brimonidine (ALPHAGAN) 0.2 % ophthalmic solution Place 1 drop into both eyes 2 (two) times daily.    . clopidogrel (PLAVIX) 75 MG tablet TAKE ONE TABLET BY MOUTH ONCE DAILY   . Coenzyme Q10 200 MG  capsule Take 200 mg by mouth daily.   . fenofibrate 160 MG tablet Take 1 tablet (160 mg total) by mouth daily.   . furosemide (LASIX) 20 MG tablet Take 1 tablet (20 mg total) by mouth daily as needed for fluid or edema. 06/21/2016: Not currently taking  . glimepiride (AMARYL) 2 MG tablet Take 1 tablet (2 mg total) by mouth daily with breakfast.   . Glucosamine-Chondroitin-Vit D3 1500-1200-800 MG-MG-UNIT PACK Take 1 tablet by mouth daily.   . Marland Kitchenatanoprost (XALATAN) 0.005 % ophthalmic solution Place 1 drop into both eyes at bedtime.    . Marland Kitchenisinopril (PRINIVIL,ZESTRIL) 2.5 MG tablet Take 1 tablet (2.5 mg total) by mouth daily.   . metFORMIN (GLUCOPHAGE) 1000 MG tablet Take 1 tablet (1,000 mg total) by mouth 2 (two) times daily with a meal.   . omega-3 acid ethyl esters (LOVAZA) 1 g capsule TAKE TWO CAPSULES BY MOUTH TWICE DAILY   . Oxycodone HCl 10 MG TABS Take 1 tablet (10 mg total) by mouth 3 (three) times daily as needed. 06/21/2016: Patient has not needed  . senna-docusate (SENOKOT-S) 8.6-50 MG tablet Take 1 tablet by mouth at bedtime as needed for mild constipation.    No facility-administered encounter medications on file as of 07/19/2016.     Functional Status:   In your present state of health, do you have any difficulty performing the following activities: 06/21/2016 05/16/2016  Hearing? N -  Vision? N -  Difficulty concentrating or making decisions? N -  Walking or climbing stairs? N -  Dressing or bathing? N -  Doing errands, shopping? N N  Preparing Food and eating ? N -  Using the Toilet? N -  In the past six months, have you accidently leaked urine? N -  Do you have problems with loss of bowel control? N -  Managing your Medications? N -  Managing your Finances? N -  Housekeeping or managing your Housekeeping? N -  Some recent data might be hidden    Fall/Depression Screening:    PHQ 2/9 Scores 06/21/2016 10/22/2015 10/07/2014 09/17/2013  PHQ - 2 Score 1 0 0 0    Assessment:   JiClair Gullingas continued to do well after his recent hospitalization, and reports that he is now independent with the care of his new colostomy.  [facility], and agrees that [facility] successfully met his previously established goals.  Plan:   [facility], as he has successfully met his previously established goals, and will notify patient's PCP of same.  It has been a pleasure participating in Ji[facility]are,  LaOneta RackRN, BSN, CCErie Insurance Groupoordinator [facility]  [phone]

## 2016-07-22 ENCOUNTER — Encounter: Payer: Self-pay | Admitting: Family Medicine

## 2016-07-22 NOTE — Telephone Encounter (Signed)
Filled and in Kim's box Given weight loss, ensure no low sugars. If hypoglycemia, rec stop amaryl (glimepiride).

## 2016-07-23 DIAGNOSIS — Z7689 Persons encountering health services in other specified circumstances: Secondary | ICD-10-CM

## 2016-07-23 LAB — HM DIABETES EYE EXAM

## 2016-07-24 NOTE — Telephone Encounter (Signed)
Patient notified and forms placed up front for pick up. Patient denied any low blood sugars. Will continue meds.

## 2016-08-23 HISTORY — PX: COLONOSCOPY: SHX174

## 2016-08-28 DIAGNOSIS — R935 Abnormal findings on diagnostic imaging of other abdominal regions, including retroperitoneum: Secondary | ICD-10-CM | POA: Diagnosis not present

## 2016-08-31 ENCOUNTER — Telehealth: Payer: Self-pay | Admitting: *Deleted

## 2016-08-31 NOTE — Telephone Encounter (Signed)
Faxed colonoscopy clearance to Dr. [name].

## 2016-09-06 ENCOUNTER — Ambulatory Visit (INDEPENDENT_AMBULATORY_CARE_PROVIDER_SITE_OTHER): Payer: PPO | Admitting: Family Medicine

## 2016-09-06 ENCOUNTER — Encounter: Payer: Self-pay | Admitting: Family Medicine

## 2016-09-06 VITALS — BP 132/84 | HR 60 | Temp 97.1°F | Wt 240.2 lb

## 2016-09-06 DIAGNOSIS — M79671 Pain in right foot: Secondary | ICD-10-CM

## 2016-09-06 DIAGNOSIS — I10 Essential (primary) hypertension: Secondary | ICD-10-CM | POA: Diagnosis not present

## 2016-09-06 DIAGNOSIS — E669 Obesity, unspecified: Secondary | ICD-10-CM

## 2016-09-06 DIAGNOSIS — E785 Hyperlipidemia, unspecified: Secondary | ICD-10-CM

## 2016-09-06 DIAGNOSIS — E118 Type 2 diabetes mellitus with unspecified complications: Secondary | ICD-10-CM

## 2016-09-06 DIAGNOSIS — IMO0001 Reserved for inherently not codable concepts without codable children: Secondary | ICD-10-CM

## 2016-09-06 DIAGNOSIS — Z933 Colostomy status: Secondary | ICD-10-CM

## 2016-09-06 DIAGNOSIS — M79672 Pain in left foot: Secondary | ICD-10-CM

## 2016-09-06 DIAGNOSIS — E1165 Type 2 diabetes mellitus with hyperglycemia: Secondary | ICD-10-CM

## 2016-09-06 DIAGNOSIS — IMO0002 Reserved for concepts with insufficient information to code with codable children: Secondary | ICD-10-CM

## 2016-09-06 DIAGNOSIS — D649 Anemia, unspecified: Secondary | ICD-10-CM

## 2016-09-06 DIAGNOSIS — Z23 Encounter for immunization: Secondary | ICD-10-CM

## 2016-09-06 DIAGNOSIS — Z87891 Personal history of nicotine dependence: Secondary | ICD-10-CM

## 2016-09-06 LAB — BASIC METABOLIC PANEL
BUN: 18 mg/dL (ref 6–23)
CALCIUM: 9.8 mg/dL (ref 8.4–10.5)
CO2: 27 meq/L (ref 19–32)
Chloride: 106 mEq/L (ref 96–112)
Creatinine, Ser: 1.16 mg/dL (ref 0.40–1.50)
GFR: 65.95 mL/min (ref >60.00)
Glucose, Bld: 133 mg/dL — ABNORMAL HIGH (ref 70–99)
POTASSIUM: 4.3 meq/L (ref 3.5–5.1)
SODIUM: 141 meq/L (ref 135–145)

## 2016-09-06 LAB — CBC WITH DIFFERENTIAL/PLATELET
Basophils Absolute: 0 10*3/uL (ref 0.0–0.1)
Basophils Relative: 0.7 % (ref 0.0–3.0)
EOS ABS: 0.5 10*3/uL (ref 0.0–0.7)
EOS PCT: 8.4 % — AB (ref 0.0–5.0)
HCT: 37.6 % — ABNORMAL LOW (ref 39.0–52.0)
HEMOGLOBIN: 12.6 g/dL — AB (ref 13.0–17.0)
LYMPHS ABS: 1.7 10*3/uL (ref 0.7–4.0)
Lymphocytes Relative: 27.4 % (ref 12.0–46.0)
MCHC: 33.5 g/dL (ref 30.0–36.0)
MCV: 89.3 fl (ref 78.0–100.0)
MONO ABS: 0.5 10*3/uL (ref 0.1–1.0)
Monocytes Relative: 8.2 % (ref 3.0–12.0)
NEUTROS PCT: 55.3 % (ref 43.0–77.0)
Neutro Abs: 3.5 10*3/uL (ref 1.4–7.7)
Platelets: 181 10*3/uL (ref 150.0–400.0)
RBC: 4.21 Mil/uL — AB (ref 4.22–5.81)
RDW: 15.3 % (ref 11.5–15.5)
WBC: 6.3 10*3/uL (ref 4.0–10.5)

## 2016-09-06 LAB — HEMOGLOBIN A1C: Hgb A1c MFr Bld: 6.4 % (ref 4.6–6.5)

## 2016-09-06 MED ORDER — LISINOPRIL 2.5 MG PO TABS: 2.5000 mg | ORAL_TABLET | Freq: Every day | ORAL | 2 refills | Status: DC

## 2016-09-06 NOTE — Progress Notes (Signed)
BP 132/84   Pulse 60   Temp 97.1 F (36.2 C) (Tympanic)   Wt 240 lb 4 oz (109 kg)   BMI 36.53 kg/m    CC: 36mo f/u visit Subjective:    Patient ID: LAQUINN VEASEY, male    DOB: 1946/10/01, 70 y.o.   MRN: [id number]  HPI: Evan Avery is a 70 y.o. male presenting on 09/06/2016 for Follow-up   Quit smoking 04/2016.   Upcoming colonoscopy through colostomy and rectum then to discuss reversal.   Wears orthotics for shoes - chronic pain of feet. Has been told he needs knee replacement as well, and has hip pain from osteoarthritis.   DM - regularly does check sugars intermittently fasting 140s. Compliant with antihyperglycemic regimen which includes: amaryl 2mg  daily and metformin 1000mg  bid. Denies low sugars or hypoglycemic symptoms. Denies paresthesias. Last diabetic eye exam 07/2016.  Pneumovax: 2014.  Prevnar: 2015. Weight gain noetd. Lab Results  Component Value Date   HGBA1C 7.2 (H) 05/16/2016   Diabetic Foot Exam - Simple   No data filed      HLD - saw Dr [name] who started fenofibrate 160mg  in addition to lipitor 40mg  1/2 tablet daily.   Relevant past medical, surgical, family and social history reviewed and updated as indicated. Interim medical history since our last visit reviewed. Allergies and medications reviewed and updated. Current Outpatient Prescriptions on File Prior to Visit  Medication Sig  . aspirin 81 MG tablet Take 81 mg by mouth daily.    Marland Kitchen atenolol (TENORMIN) 50 MG tablet TAKE ONE & ONE-HALF TABLETS BY MOUTH ONCE DAILY (Patient taking differently: TAKE ONE & ONE-HALF TABLETS =75mg  BY MOUTH ONCE DAILY)  . atorvastatin (LIPITOR) 40 MG tablet TAKE ONE-HALF TABLET BY MOUTH ONCE DAILY (Patient taking differently: TAKE ONE-HALF TABLET= 20mg  BY MOUTH ONCE DAILY)  . clopidogrel (PLAVIX) 75 MG tablet TAKE ONE TABLET BY MOUTH ONCE DAILY  . Coenzyme Q10 200 MG capsule Take 200 mg by mouth daily.  . fenofibrate 160 MG tablet Take 1 tablet (160 mg total) by  mouth daily.  Marland Kitchen glimepiride (AMARYL) 2 MG tablet Take 1 tablet (2 mg total) by mouth daily with breakfast.  . latanoprost (XALATAN) 0.005 % ophthalmic solution Place 1 drop into both eyes at bedtime.   . metFORMIN (GLUCOPHAGE) 1000 MG tablet Take 1 tablet (1,000 mg total) by mouth 2 (two) times daily with a meal.  . omega-3 acid ethyl esters (LOVAZA) 1 g capsule TAKE TWO CAPSULES BY MOUTH TWICE DAILY  . senna-docusate (SENOKOT-S) 8.6-50 MG tablet Take 1 tablet by mouth at bedtime as needed for mild constipation.  . Glucosamine-Chondroitin-Vit D3 1500-1200-800 MG-MG-UNIT PACK Take 1 tablet by mouth daily.   No current facility-administered medications on file prior to visit.     Review of Systems Per HPI unless specifically indicated in ROS section     Objective:    BP 132/84   Pulse 60   Temp 97.1 F (36.2 C) (Tympanic)   Wt 240 lb 4 oz (109 kg)   BMI 36.53 kg/m   Wt Readings from Last 3 Encounters:  09/06/16 240 lb 4 oz (109 kg)  07/19/16 222 lb (100.7 kg)  06/21/16 220 lb (99.8 kg)    Physical Exam  Constitutional: He appears well-developed and well-nourished. No distress.  HENT:  Mouth/Throat: Oropharynx is clear and moist. No oropharyngeal exudate.  Cardiovascular: Normal rate, regular rhythm, normal heart sounds and intact distal pulses.   No murmur heard. Pulmonary/Chest: Effort  normal and breath sounds normal. No respiratory distress. He has no wheezes. He has no rales.  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  Psychiatric: He has a normal mood and affect.  Nursing note and vitals reviewed.     Assessment & Plan:   Problem List Items Addressed This Visit    Bilateral foot pain    I have asked him to bring me records of work up to date - has seen foot doctor for orthotics, had xrays.       Colostomy in place [facility])    Upcoming colonoscopy then discussion on ostomy reversal.      Diabetes mellitus type 2,  uncontrolled, with complications ([place]) - Primary    Chronic, reports compliance with meds. Update A1c today.       Relevant Medications   lisinopril (PRINIVIL,ZESTRIL) 2.5 MG tablet   Other Relevant Orders   Hemoglobin A1c   Essential hypertension    Chronic, stable. Continue current regimen.       Relevant Medications   lisinopril (PRINIVIL,ZESTRIL) 2.5 MG tablet   Other Relevant Orders   Basic metabolic panel   Ex-smoker    Congratulated on continued abstinence      Hyperlipidemia with target LDL less than 70    Reviewed lipitor dosing, fish oil.       Relevant Medications   lisinopril (PRINIVIL,ZESTRIL) 2.5 MG tablet   Obesity, Class II, BMI 35-39.9, with comorbidity    Reviewed weight gain noted. Activity limited by arthralgias.       Other Visit Diagnoses    Need for influenza vaccination       Relevant Orders   Flu Vaccine QUAD 36+ mos PF IM (Fluarix & Fluzone Quad PF)   Anemia, unspecified type       Relevant Orders   CBC with Differential/Platelet       Follow up plan: Return in about 3 months (around 12/07/2016) for medicare wellness visit.  [name], MD

## 2016-09-06 NOTE — Assessment & Plan Note (Signed)
Chronic, stable. Continue current regimen. 

## 2016-09-06 NOTE — Assessment & Plan Note (Signed)
Congratulated on continued abstinence 

## 2016-09-06 NOTE — Patient Instructions (Addendum)
Flu shot today Continue current medicines.  Labs today. Return as needed or in 3 months for medicare wellness visit

## 2016-09-06 NOTE — Assessment & Plan Note (Signed)
Chronic, reports compliance with meds. Update A1c today.

## 2016-09-06 NOTE — Progress Notes (Signed)
Pre visit review using our clinic review tool, if applicable. No additional management support is needed unless otherwise documented below in the visit note. 

## 2016-09-06 NOTE — Assessment & Plan Note (Signed)
Reviewed lipitor dosing, fish oil.

## 2016-09-06 NOTE — Assessment & Plan Note (Signed)
Upcoming colonoscopy then discussion on ostomy reversal.

## 2016-09-06 NOTE — Assessment & Plan Note (Addendum)
I have asked him to bring me records of work up to date - has seen foot doctor for orthotics, had xrays.

## 2016-09-06 NOTE — Assessment & Plan Note (Signed)
Reviewed weight gain noted. Activity limited by arthralgias.

## 2016-09-11 ENCOUNTER — Encounter: Payer: Self-pay | Admitting: *Deleted

## 2016-09-13 ENCOUNTER — Encounter: Payer: Self-pay | Admitting: Family Medicine

## 2016-09-13 DIAGNOSIS — D123 Benign neoplasm of transverse colon: Secondary | ICD-10-CM | POA: Diagnosis not present

## 2016-09-13 DIAGNOSIS — K5730 Diverticulosis of large intestine without perforation or abscess without bleeding: Secondary | ICD-10-CM | POA: Diagnosis not present

## 2016-09-13 DIAGNOSIS — K635 Polyp of colon: Secondary | ICD-10-CM | POA: Diagnosis not present

## 2016-09-13 DIAGNOSIS — R933 Abnormal findings on diagnostic imaging of other parts of digestive tract: Secondary | ICD-10-CM | POA: Diagnosis not present

## 2016-09-13 DIAGNOSIS — K621 Rectal polyp: Secondary | ICD-10-CM | POA: Diagnosis not present

## 2016-09-13 DIAGNOSIS — D124 Benign neoplasm of descending colon: Secondary | ICD-10-CM | POA: Diagnosis not present

## 2016-09-13 DIAGNOSIS — K6389 Other specified diseases of intestine: Secondary | ICD-10-CM | POA: Diagnosis not present

## 2016-09-13 DIAGNOSIS — D126 Benign neoplasm of colon, unspecified: Secondary | ICD-10-CM | POA: Diagnosis not present

## 2016-09-13 LAB — HM COLONOSCOPY

## 2016-09-19 ENCOUNTER — Ambulatory Visit: Payer: Self-pay | Admitting: General Surgery

## 2016-09-19 DIAGNOSIS — D126 Benign neoplasm of colon, unspecified: Secondary | ICD-10-CM | POA: Diagnosis not present

## 2016-09-19 DIAGNOSIS — Z933 Colostomy status: Secondary | ICD-10-CM | POA: Diagnosis not present

## 2016-09-19 DIAGNOSIS — K635 Polyp of colon: Secondary | ICD-10-CM | POA: Diagnosis not present

## 2016-09-20 ENCOUNTER — Other Ambulatory Visit (HOSPITAL_COMMUNITY): Payer: Self-pay | Admitting: General Surgery

## 2016-09-20 DIAGNOSIS — K5720 Diverticulitis of large intestine with perforation and abscess without bleeding: Secondary | ICD-10-CM

## 2016-09-27 ENCOUNTER — Ambulatory Visit: Payer: Self-pay | Admitting: General Surgery

## 2016-10-02 ENCOUNTER — Ambulatory Visit (HOSPITAL_COMMUNITY)
Admission: RE | Admit: 2016-10-02 | Discharge: 2016-10-02 | Disposition: A | Discharge status: Home / Self Care | Payer: PPO | Source: Ambulatory Visit | Attending: General Surgery | Admitting: General Surgery

## 2016-10-02 DIAGNOSIS — Z933 Colostomy status: Secondary | ICD-10-CM | POA: Insufficient documentation

## 2016-10-02 DIAGNOSIS — K631 Perforation of intestine (nontraumatic): Secondary | ICD-10-CM | POA: Diagnosis not present

## 2016-10-02 DIAGNOSIS — K5720 Diverticulitis of large intestine with perforation and abscess without bleeding: Secondary | ICD-10-CM

## 2016-10-02 MED ORDER — DIATRIZOATE MEGLUMINE & SODIUM 66-10 % PO SOLN
ORAL | Status: AC
  Filled 2016-10-02: qty 240

## 2016-10-02 MED ORDER — DIATRIZOATE MEGLUMINE & SODIUM 66-10 % PO SOLN
240.0000 mL | Freq: Once | ORAL | Status: DC
  Administered 2016-10-02: 240 mL via ORAL
  Filled 2016-10-02: qty 240

## 2016-10-04 ENCOUNTER — Encounter: Payer: Self-pay | Admitting: General Surgery

## 2016-10-17 ENCOUNTER — Encounter: Payer: Self-pay | Admitting: Family Medicine

## 2016-10-23 ENCOUNTER — Other Ambulatory Visit: Payer: Self-pay | Admitting: Family Medicine

## 2016-10-24 NOTE — Telephone Encounter (Signed)
Received refill electronically Last refill 05/02/16 #90 Last office visit 09/06/16

## 2016-10-30 ENCOUNTER — Other Ambulatory Visit: Payer: Self-pay | Admitting: Family Medicine

## 2016-11-03 ENCOUNTER — Other Ambulatory Visit (HOSPITAL_COMMUNITY): Payer: Self-pay | Admitting: *Deleted

## 2016-11-03 NOTE — Patient Instructions (Addendum)
Evan Avery  11/03/2016   Your procedure is scheduled on: 11-10-16  Report to [facility] Main  Entrance take [facility]  elevators to 3rd floor to  [name] at 530  AM.  Call this number if you have problems the morning of surgery [phone]  Bring cpap mask and tubing   Remember: ONLY 1 PERSON MAY GO WITH YOU TO SHORT STAY TO GET  READY MORNING OF YOUR SURGERY.  Do not eat food :After Midnight on Wednesday 11-08-2016. CLEAR LIQUIDS ALL DAY 11-09-2016 PER DR Leeroy Bock. FOLLOW ALL BOWEL PREP INSTRUCTIONS FROM DR Leeroy Bock AND DRINK PLENTY OF CLEAR LQIUIDS WITH YOUR BOWEL PREP TO PREVENT DEHYDRATION.      Take these medicines the morning of surgery with A SIP OF WATER: Morning EYE DROP, Tylenol if needed  DO NOT TAKE ANY DIABETIC MEDICATIONS DAY OF YOUR SURGERY                               You may not have any metal on your body including hair pins and              piercings  Do not wear jewelry, make-up, lotions, powders or perfumes, deodorant             Do not wear nail polish.  Do not shave  48 hours prior to surgery.              Men may shave face and neck.   Do not bring valuables to the hospital. [name].  Contacts, dentures or bridgework may not be worn into surgery.  Leave suitcase in the car. After surgery it may be brought to your room.                  Please read over the following fact sheets you were given: _____________________________________________________________________             How to Manage Your Diabetes Before and After Surgery  Why is it important to control my blood sugar before and after surgery? . Improving blood sugar levels before and after surgery helps healing and can limit problems. . A way of improving blood sugar control is eating a healthy diet by: o  Eating less sugar and carbohydrates o  Increasing activity/exercise o  Talking with your doctor  about reaching your blood sugar goals . High blood sugars (greater than 180 mg/dL) can raise your risk of infections and slow your recovery, so you will need to focus on controlling your diabetes during the weeks before surgery. . Make sure that the doctor who takes care of your diabetes knows about your planned surgery including the date and location.  How do I manage my blood sugar before surgery? . Check your blood sugar at least 4 times a day, starting 2 days before surgery, to make sure that the level is not too high or low. o Check your blood sugar the morning of your surgery when you wake up and every 2 hours until you get to the Short Stay unit. . If your blood sugar is less than 70 mg/dL, you will need to treat for low blood sugar: o Do not take insulin. o Treat a low blood sugar (  less than 70 mg/dL) with  cup of clear juice (cranberry or apple), 4 glucose tablets, OR glucose gel. o Recheck blood sugar in 15 minutes after treatment (to make sure it is greater than 70 mg/dL). If your blood sugar is not greater than 70 mg/dL on recheck, call [phone] for further instructions. . Report your blood sugar to the short stay nurse when you get to Short Stay.  . If you are admitted to the hospital after surgery: o Your blood sugar will be checked by the staff and you will probably be given insulin after surgery (instead of oral diabetes medicines) to make sure you have good blood sugar levels. o The goal for blood sugar control after surgery is 80-180 mg/dL.   WHAT DO I DO ABOUT MY DIABETES MEDICATION?  YOU MAY TAKE YOUR METFORMIN AS USUAL THE DAY BEFORE SURGERY 11-09-17 DO NOT TAKE YOUR METFORMIN THE DAY OF SURGERY 11-10-17 YOU MAKE TAKE YOUR GLIMPERIDE ([place]) THE DAY BEFORE SURGERY IN AM ON 11-09-17 DO NOT TAKE YOUR GLIMPERIDE ([place]) DAY OF SURGERY 11-10-16 . Do not take oral diabetes medicines (pills) the morning of surgery.  Reviewed and Endorsed by [facility] Patient Education  Committee, August 2015   CLEAR LIQUID DIET   Foods Allowed                                                                     Foods Excluded  Coffee and tea, regular and decaf                             liquids that you cannot  Plain Jell-O in any flavor                                             see through such as: Fruit ices (not with fruit pulp)                                     milk, soups, orange juice  Iced Popsicles                                    All solid food Carbonated beverages, regular and diet                                    Cranberry, grape and apple juices Sports drinks like Gatorade Lightly seasoned clear broth or consume(fat free) Sugar, honey syrup  Sample Menu Breakfast                                Lunch                                     Supper Cranberry  juice                    Beef broth                            Chicken broth Jell-O                                     Grape juice                           Apple juice Coffee or tea                        Jell-O                                      Popsicle                                                Coffee or tea                        Coffee or tea  _____________________________________________________________________  [facility] - Preparing for Surgery Before surgery, you can play an important role.  Because skin is not sterile, your skin needs to be as free of germs as possible.  You can reduce the number of germs on your skin by washing with CHG (chlorahexidine gluconate) soap before surgery.  CHG is an antiseptic cleaner which kills germs and bonds with the skin to continue killing germs even after washing. Please DO NOT use if you have an allergy to CHG or antibacterial soaps.  If your skin becomes reddened/irritated stop using the CHG and inform your nurse when you arrive at Short Stay. Do not shave (including legs and underarms) for at least 48 hours prior to the first CHG shower.  You  may shave your face/neck. Please follow these instructions carefully:  1.  Shower with CHG Soap the night before surgery and the  morning of Surgery.  2.  If you choose to wash your hair, wash your hair first as usual with your  normal  shampoo.  3.  After you shampoo, rinse your hair and body thoroughly to remove the  shampoo.                           4.  Use CHG as you would any other liquid soap.  You can apply chg directly  to the skin and wash                       Gently with a scrungie or clean washcloth.  5.  Apply the CHG Soap to your body ONLY FROM THE NECK DOWN.   Do not use on face/ open                           Wound or open sores. Avoid contact with eyes, ears mouth and genitals (private parts).  Wash face,  Genitals (private parts) with your normal soap.             6.  Wash thoroughly, paying special attention to the area where your surgery  will be performed.  7.  Thoroughly rinse your body with warm water from the neck down.  8.  DO NOT shower/wash with your normal soap after using and rinsing off  the CHG Soap.                9.  Pat yourself dry with a clean towel.            10.  Wear clean pajamas.            11.  Place clean sheets on your bed the night of your first shower and do not  sleep with pets. Day of Surgery : Do not apply any lotions/deodorants the morning of surgery.  Please wear clean clothes to the hospital/surgery center.  FAILURE TO FOLLOW THESE INSTRUCTIONS MAY RESULT IN THE CANCELLATION OF YOUR SURGERY PATIENT SIGNATURE_________________________________  NURSE SIGNATURE__________________________________  ________________________________________________________________________  WHAT IS A BLOOD TRANSFUSION? Blood Transfusion Information  A transfusion is the replacement of blood or some of its parts. Blood is made up of multiple cells which provide different functions.  Red blood cells carry oxygen and are used for blood loss  replacement.  White blood cells fight against infection.  Platelets control bleeding.  Plasma helps clot blood.  Other blood products are available for specialized needs, such as hemophilia or other clotting disorders. BEFORE THE TRANSFUSION  Who gives blood for transfusions?   Healthy volunteers who are fully evaluated to make sure their blood is safe. This is blood bank blood. Transfusion therapy is the safest it has ever been in the practice of medicine. Before blood is taken from a donor, a complete history is taken to make sure that person has no history of diseases nor engages in risky social behavior (examples are intravenous drug use or sexual activity with multiple partners). The donor's travel history is screened to minimize risk of transmitting infections, such as malaria. The donated blood is tested for signs of infectious diseases, such as HIV and hepatitis. The blood is then tested to be sure it is compatible with you in order to minimize the chance of a transfusion reaction. If you or a relative donates blood, this is often done in anticipation of surgery and is not appropriate for emergency situations. It takes many days to process the donated blood. RISKS AND COMPLICATIONS Although transfusion therapy is very safe and saves many lives, the main dangers of transfusion include:   Getting an infectious disease.  Developing a transfusion reaction. This is an allergic reaction to something in the blood you were given. Every precaution is taken to prevent this. The decision to have a blood transfusion has been considered carefully by your caregiver before blood is given. Blood is not given unless the benefits outweigh the risks. AFTER THE TRANSFUSION  Right after receiving a blood transfusion, you will usually feel much better and more energetic. This is especially true if your red blood cells have gotten low (anemic). The transfusion raises the level of the red blood cells which  carry oxygen, and this usually causes an energy increase.  The nurse administering the transfusion will monitor you carefully for complications. HOME CARE INSTRUCTIONS  No special instructions are needed after a transfusion. You may find your energy is better. Speak with your caregiver about any  limitations on activity for underlying diseases you may have. SEEK MEDICAL CARE IF:   Your condition is not improving after your transfusion.  You develop redness or irritation at the intravenous (IV) site. SEEK IMMEDIATE MEDICAL CARE IF:  Any of the following symptoms occur over the next 12 hours:  Shaking chills.  You have a temperature by mouth above 102 F (38.9 C), not controlled by medicine.  Chest, back, or muscle pain.  People around you feel you are not acting correctly or are confused.  Shortness of breath or difficulty breathing.  Dizziness and fainting.  You get a rash or develop hives.  You have a decrease in urine output.  Your urine turns a dark color or changes to pink, red, or brown. Any of the following symptoms occur over the next 10 days:  You have a temperature by mouth above 102 F (38.9 C), not controlled by medicine.  Shortness of breath.  Weakness after normal activity.  The white part of the eye turns yellow (jaundice).  You have a decrease in the amount of urine or are urinating less often.  Your urine turns a dark color or changes to pink, red, or brown. Document Released: 10/06/2000 Document Revised: 01/01/2012 Document Reviewed: 05/25/2008 [facility] Patient Information 2014 [place], [place].  _______________________________________________________________________

## 2016-11-07 ENCOUNTER — Encounter (INDEPENDENT_AMBULATORY_CARE_PROVIDER_SITE_OTHER): Payer: Self-pay

## 2016-11-07 ENCOUNTER — Encounter (HOSPITAL_COMMUNITY): Payer: Self-pay

## 2016-11-07 ENCOUNTER — Encounter (HOSPITAL_COMMUNITY)
Admission: RE | Admit: 2016-11-07 | Discharge: 2016-11-07 | Disposition: A | Discharge status: Home / Self Care | Payer: PPO | Source: Ambulatory Visit | Attending: General Surgery | Admitting: General Surgery

## 2016-11-07 DIAGNOSIS — Z820 Family history of epilepsy and other diseases of the nervous system: Secondary | ICD-10-CM | POA: Diagnosis not present

## 2016-11-07 DIAGNOSIS — Z7982 Long term (current) use of aspirin: Secondary | ICD-10-CM | POA: Diagnosis not present

## 2016-11-07 DIAGNOSIS — E119 Type 2 diabetes mellitus without complications: Secondary | ICD-10-CM | POA: Diagnosis not present

## 2016-11-07 DIAGNOSIS — Z8249 Family history of ischemic heart disease and other diseases of the circulatory system: Secondary | ICD-10-CM | POA: Diagnosis not present

## 2016-11-07 DIAGNOSIS — Z6836 Body mass index (BMI) 36.0-36.9, adult: Secondary | ICD-10-CM | POA: Diagnosis not present

## 2016-11-07 DIAGNOSIS — Z01812 Encounter for preprocedural laboratory examination: Secondary | ICD-10-CM | POA: Insufficient documentation

## 2016-11-07 DIAGNOSIS — G4730 Sleep apnea, unspecified: Secondary | ICD-10-CM | POA: Diagnosis not present

## 2016-11-07 DIAGNOSIS — Z433 Encounter for attention to colostomy: Secondary | ICD-10-CM | POA: Diagnosis not present

## 2016-11-07 DIAGNOSIS — H409 Unspecified glaucoma: Secondary | ICD-10-CM | POA: Diagnosis not present

## 2016-11-07 DIAGNOSIS — I252 Old myocardial infarction: Secondary | ICD-10-CM | POA: Diagnosis not present

## 2016-11-07 DIAGNOSIS — Z7984 Long term (current) use of oral hypoglycemic drugs: Secondary | ICD-10-CM | POA: Diagnosis not present

## 2016-11-07 DIAGNOSIS — E785 Hyperlipidemia, unspecified: Secondary | ICD-10-CM | POA: Diagnosis not present

## 2016-11-07 DIAGNOSIS — Z7902 Long term (current) use of antithrombotics/antiplatelets: Secondary | ICD-10-CM | POA: Diagnosis not present

## 2016-11-07 DIAGNOSIS — Z9049 Acquired absence of other specified parts of digestive tract: Secondary | ICD-10-CM | POA: Diagnosis not present

## 2016-11-07 DIAGNOSIS — I2510 Atherosclerotic heart disease of native coronary artery without angina pectoris: Secondary | ICD-10-CM | POA: Diagnosis not present

## 2016-11-07 DIAGNOSIS — Z809 Family history of malignant neoplasm, unspecified: Secondary | ICD-10-CM | POA: Diagnosis not present

## 2016-11-07 DIAGNOSIS — I10 Essential (primary) hypertension: Secondary | ICD-10-CM | POA: Diagnosis not present

## 2016-11-07 DIAGNOSIS — Z833 Family history of diabetes mellitus: Secondary | ICD-10-CM | POA: Diagnosis not present

## 2016-11-07 DIAGNOSIS — E669 Obesity, unspecified: Secondary | ICD-10-CM | POA: Diagnosis not present

## 2016-11-07 DIAGNOSIS — Z87891 Personal history of nicotine dependence: Secondary | ICD-10-CM | POA: Diagnosis not present

## 2016-11-07 HISTORY — DX: Unspecified osteoarthritis, unspecified site: M19.90

## 2016-11-07 LAB — COMPREHENSIVE METABOLIC PANEL
ALT: 36 U/L (ref 17–63)
ANION GAP: 7 (ref 5–15)
AST: 28 U/L (ref 15–41)
Albumin: 4.6 g/dL (ref 3.5–5.0)
Alkaline Phosphatase: 48 U/L (ref 38–126)
BUN: 20 mg/dL (ref 6–20)
CHLORIDE: 104 mmol/L (ref 101–111)
CO2: 25 mmol/L (ref 22–32)
CREATININE: 1.12 mg/dL (ref 0.61–1.24)
Calcium: 9.8 mg/dL (ref 8.9–10.3)
Glucose, Bld: 160 mg/dL — ABNORMAL HIGH (ref 65–99)
Potassium: 4.1 mmol/L (ref 3.5–5.1)
SODIUM: 136 mmol/L (ref 135–145)
Total Bilirubin: 0.4 mg/dL (ref 0.3–1.2)
Total Protein: 7.1 g/dL (ref 6.5–8.1)

## 2016-11-07 LAB — CBC WITH DIFFERENTIAL/PLATELET
Basophils Absolute: 0.1 10*3/uL (ref 0.0–0.1)
Basophils Relative: 1 %
EOS ABS: 0.4 10*3/uL (ref 0.0–0.7)
EOS PCT: 5 %
HCT: 36.2 % — ABNORMAL LOW (ref 39.0–52.0)
Hemoglobin: 12.4 g/dL — ABNORMAL LOW (ref 13.0–17.0)
LYMPHS ABS: 2.1 10*3/uL (ref 0.7–4.0)
LYMPHS PCT: 29 %
MCH: 30.8 pg (ref 26.0–34.0)
MCHC: 34.3 g/dL (ref 30.0–36.0)
MCV: 90 fL (ref 78.0–100.0)
MONO ABS: 0.5 10*3/uL (ref 0.1–1.0)
MONOS PCT: 7 %
Neutro Abs: 4.3 10*3/uL (ref 1.7–7.7)
Neutrophils Relative %: 58 %
PLATELETS: 170 10*3/uL (ref 150–400)
RBC: 4.02 MIL/uL — AB (ref 4.22–5.81)
RDW: 13.4 % (ref 11.5–15.5)
WBC: 7.4 10*3/uL (ref 4.0–10.5)

## 2016-11-07 LAB — SURGICAL PCR SCREEN
MRSA, PCR: POSITIVE — AB
Staphylococcus aureus: POSITIVE — AB

## 2016-11-07 LAB — GLUCOSE, CAPILLARY: Glucose-Capillary: 204 mg/dL — ABNORMAL HIGH (ref 65–99)

## 2016-11-07 NOTE — Progress Notes (Signed)
03/14/16 abnormal EKG addressed by cardio Dr [name] with patients LOV

## 2016-11-08 ENCOUNTER — Other Ambulatory Visit (HOSPITAL_COMMUNITY): Payer: Self-pay | Admitting: *Deleted

## 2016-11-08 LAB — HEMOGLOBIN A1C
Hgb A1c MFr Bld: 7 % — ABNORMAL HIGH (ref 4.8–5.6)
Mean Plasma Glucose: 154 mg/dL

## 2016-11-10 ENCOUNTER — Encounter (HOSPITAL_COMMUNITY): Payer: Self-pay | Admitting: *Deleted

## 2016-11-10 ENCOUNTER — Encounter (HOSPITAL_COMMUNITY)
Admission: RE | Disposition: A | Discharge status: Home Health | Payer: Self-pay | Source: Ambulatory Visit | Attending: General Surgery

## 2016-11-10 ENCOUNTER — Inpatient Hospital Stay (HOSPITAL_COMMUNITY): Payer: PPO | Admitting: Certified Registered Nurse Anesthetist

## 2016-11-10 ENCOUNTER — Inpatient Hospital Stay (HOSPITAL_COMMUNITY)
Admission: RE | Admit: 2016-11-10 | Discharge: 2016-11-14 | DRG: 331 | Disposition: A | Discharge status: Home Health | Payer: PPO | Source: Ambulatory Visit | Attending: General Surgery | Admitting: General Surgery

## 2016-11-10 DIAGNOSIS — K5720 Diverticulitis of large intestine with perforation and abscess without bleeding: Secondary | ICD-10-CM | POA: Diagnosis not present

## 2016-11-10 DIAGNOSIS — Z433 Encounter for attention to colostomy: Secondary | ICD-10-CM | POA: Diagnosis not present

## 2016-11-10 DIAGNOSIS — H409 Unspecified glaucoma: Secondary | ICD-10-CM | POA: Diagnosis not present

## 2016-11-10 DIAGNOSIS — Z809 Family history of malignant neoplasm, unspecified: Secondary | ICD-10-CM | POA: Diagnosis not present

## 2016-11-10 DIAGNOSIS — Z820 Family history of epilepsy and other diseases of the nervous system: Secondary | ICD-10-CM

## 2016-11-10 DIAGNOSIS — Z7984 Long term (current) use of oral hypoglycemic drugs: Secondary | ICD-10-CM | POA: Diagnosis not present

## 2016-11-10 DIAGNOSIS — G4730 Sleep apnea, unspecified: Secondary | ICD-10-CM | POA: Diagnosis not present

## 2016-11-10 DIAGNOSIS — K9400 Colostomy complication, unspecified: Secondary | ICD-10-CM | POA: Diagnosis not present

## 2016-11-10 DIAGNOSIS — Z9049 Acquired absence of other specified parts of digestive tract: Secondary | ICD-10-CM

## 2016-11-10 DIAGNOSIS — E785 Hyperlipidemia, unspecified: Secondary | ICD-10-CM | POA: Diagnosis present

## 2016-11-10 DIAGNOSIS — Z7982 Long term (current) use of aspirin: Secondary | ICD-10-CM | POA: Diagnosis not present

## 2016-11-10 DIAGNOSIS — E669 Obesity, unspecified: Secondary | ICD-10-CM | POA: Diagnosis present

## 2016-11-10 DIAGNOSIS — E119 Type 2 diabetes mellitus without complications: Secondary | ICD-10-CM | POA: Diagnosis not present

## 2016-11-10 DIAGNOSIS — I2510 Atherosclerotic heart disease of native coronary artery without angina pectoris: Secondary | ICD-10-CM | POA: Diagnosis not present

## 2016-11-10 DIAGNOSIS — Z8249 Family history of ischemic heart disease and other diseases of the circulatory system: Secondary | ICD-10-CM | POA: Diagnosis not present

## 2016-11-10 DIAGNOSIS — I252 Old myocardial infarction: Secondary | ICD-10-CM

## 2016-11-10 DIAGNOSIS — Z933 Colostomy status: Secondary | ICD-10-CM

## 2016-11-10 DIAGNOSIS — Z7902 Long term (current) use of antithrombotics/antiplatelets: Secondary | ICD-10-CM

## 2016-11-10 DIAGNOSIS — K660 Peritoneal adhesions (postprocedural) (postinfection): Secondary | ICD-10-CM | POA: Diagnosis not present

## 2016-11-10 DIAGNOSIS — I10 Essential (primary) hypertension: Secondary | ICD-10-CM | POA: Diagnosis not present

## 2016-11-10 DIAGNOSIS — Z6836 Body mass index (BMI) 36.0-36.9, adult: Secondary | ICD-10-CM

## 2016-11-10 DIAGNOSIS — K9409 Other complications of colostomy: Secondary | ICD-10-CM | POA: Diagnosis not present

## 2016-11-10 DIAGNOSIS — T8189XA Other complications of procedures, not elsewhere classified, initial encounter: Secondary | ICD-10-CM | POA: Diagnosis not present

## 2016-11-10 DIAGNOSIS — Z833 Family history of diabetes mellitus: Secondary | ICD-10-CM

## 2016-11-10 DIAGNOSIS — L98491 Non-pressure chronic ulcer of skin of other sites limited to breakdown of skin: Secondary | ICD-10-CM | POA: Diagnosis not present

## 2016-11-10 DIAGNOSIS — Z87891 Personal history of nicotine dependence: Secondary | ICD-10-CM

## 2016-11-10 DIAGNOSIS — G4733 Obstructive sleep apnea (adult) (pediatric): Secondary | ICD-10-CM | POA: Diagnosis not present

## 2016-11-10 HISTORY — PX: COLOSTOMY TAKEDOWN: SHX5258

## 2016-11-10 HISTORY — PX: LAPAROSCOPIC LYSIS OF ADHESIONS: SHX5905

## 2016-11-10 HISTORY — PX: PROCTOSCOPY: SHX2266

## 2016-11-10 LAB — TYPE AND SCREEN
ABO/RH(D): O POS
ANTIBODY SCREEN: NEGATIVE

## 2016-11-10 LAB — GLUCOSE, CAPILLARY
GLUCOSE-CAPILLARY: 195 mg/dL — AB (ref 65–99)
GLUCOSE-CAPILLARY: 248 mg/dL — AB (ref 65–99)
Glucose-Capillary: 156 mg/dL — ABNORMAL HIGH (ref 65–99)
Glucose-Capillary: 182 mg/dL — ABNORMAL HIGH (ref 65–99)
Glucose-Capillary: 213 mg/dL — ABNORMAL HIGH (ref 65–99)
Glucose-Capillary: 221 mg/dL — ABNORMAL HIGH (ref 65–99)

## 2016-11-10 SURGERY — CLOSURE, COLOSTOMY, LAPAROSCOPIC
Anesthesia: General | Site: Abdomen

## 2016-11-10 MED ORDER — KETOROLAC TROMETHAMINE 30 MG/ML IJ SOLN
30.0000 mg | Freq: Once | INTRAMUSCULAR | Status: AC
  Administered 2016-11-10: 30 mg via INTRAVENOUS

## 2016-11-10 MED ORDER — ONDANSETRON HCL 4 MG/2ML IJ SOLN: 4.0000 mg | INTRAMUSCULAR | Status: DC | PRN

## 2016-11-10 MED ORDER — ALVIMOPAN 12 MG PO CAPS
12.0000 mg | ORAL_CAPSULE | Freq: Once | ORAL | Status: AC
  Administered 2016-11-10: 12 mg via ORAL
  Filled 2016-11-10: qty 1

## 2016-11-10 MED ORDER — LABETALOL HCL 5 MG/ML IV SOLN
INTRAVENOUS | Status: DC | PRN
  Administered 2016-11-10: 2.5 mg via INTRAVENOUS
  Administered 2016-11-10: 5 mg via INTRAVENOUS

## 2016-11-10 MED ORDER — HYDROMORPHONE HCL 1 MG/ML IJ SOLN: 0.2500 mg | INTRAMUSCULAR | Status: DC | PRN

## 2016-11-10 MED ORDER — CHLORHEXIDINE GLUCONATE CLOTH 2 % EX PADS: 6.0000 | MEDICATED_PAD | Freq: Once | CUTANEOUS | Status: DC

## 2016-11-10 MED ORDER — KETOROLAC TROMETHAMINE 30 MG/ML IJ SOLN
INTRAMUSCULAR | Status: AC
  Filled 2016-11-10: qty 1

## 2016-11-10 MED ORDER — FENTANYL CITRATE (PF) 100 MCG/2ML IJ SOLN
INTRAMUSCULAR | Status: AC
  Filled 2016-11-10: qty 2

## 2016-11-10 MED ORDER — LATANOPROST 0.005 % OP SOLN
1.0000 [drp] | Freq: Every day | OPHTHALMIC | Status: DC
  Administered 2016-11-10 – 2016-11-13 (×4): 1 [drp] via OPHTHALMIC
  Filled 2016-11-10 (×2): qty 2.5

## 2016-11-10 MED ORDER — PROMETHAZINE HCL 25 MG/ML IJ SOLN: 6.2500 mg | INTRAMUSCULAR | Status: DC | PRN

## 2016-11-10 MED ORDER — DEXAMETHASONE SODIUM PHOSPHATE 10 MG/ML IJ SOLN
INTRAMUSCULAR | Status: AC
  Filled 2016-11-10: qty 1

## 2016-11-10 MED ORDER — PROMETHAZINE HCL 25 MG/ML IJ SOLN
6.2500 mg | INTRAMUSCULAR | Status: DC | PRN
  Administered 2016-11-10: 6.25 mg via INTRAVENOUS

## 2016-11-10 MED ORDER — PROPOFOL 10 MG/ML IV BOLUS
INTRAVENOUS | Status: DC | PRN
  Administered 2016-11-10: 200 mg via INTRAVENOUS

## 2016-11-10 MED ORDER — DEXTROSE 5 % IV SOLN
2.0000 g | Freq: Two times a day (BID) | INTRAVENOUS | Status: AC
  Administered 2016-11-10: 2 g via INTRAVENOUS
  Filled 2016-11-10: qty 2

## 2016-11-10 MED ORDER — BUPIVACAINE HCL (PF) 0.5 % IJ SOLN
INTRAMUSCULAR | Status: DC | PRN
  Administered 2016-11-10: 2 mL

## 2016-11-10 MED ORDER — EPHEDRINE 5 MG/ML INJ
INTRAVENOUS | Status: AC
  Filled 2016-11-10: qty 10

## 2016-11-10 MED ORDER — INSULIN ASPART 100 UNIT/ML ~~LOC~~ SOLN
0.0000 [IU] | SUBCUTANEOUS | Status: DC
  Administered 2016-11-10: 5 [IU] via SUBCUTANEOUS
  Administered 2016-11-10: 3 [IU] via SUBCUTANEOUS
  Administered 2016-11-11 (×2): 2 [IU] via SUBCUTANEOUS
  Administered 2016-11-11 (×2): 3 [IU] via SUBCUTANEOUS
  Administered 2016-11-11 (×2): 2 [IU] via SUBCUTANEOUS
  Administered 2016-11-11: 1 [IU] via SUBCUTANEOUS
  Administered 2016-11-12 (×2): 2 [IU] via SUBCUTANEOUS
  Administered 2016-11-12: 1 [IU] via SUBCUTANEOUS
  Administered 2016-11-12 – 2016-11-13 (×4): 2 [IU] via SUBCUTANEOUS

## 2016-11-10 MED ORDER — COENZYME Q10 200 MG PO CAPS: 200.0000 mg | ORAL_CAPSULE | Freq: Every day | ORAL | Status: DC

## 2016-11-10 MED ORDER — SUCCINYLCHOLINE CHLORIDE 200 MG/10ML IV SOSY
PREFILLED_SYRINGE | INTRAVENOUS | Status: DC | PRN
  Administered 2016-11-10: 120 mg via INTRAVENOUS

## 2016-11-10 MED ORDER — LIDOCAINE 2% (20 MG/ML) 5 ML SYRINGE
INTRAMUSCULAR | Status: AC
  Filled 2016-11-10: qty 5

## 2016-11-10 MED ORDER — PANTOPRAZOLE SODIUM 40 MG IV SOLR
40.0000 mg | INTRAVENOUS | Status: DC
  Administered 2016-11-11 – 2016-11-12 (×2): 40 mg via INTRAVENOUS
  Filled 2016-11-10 (×3): qty 40

## 2016-11-10 MED ORDER — 0.9 % SODIUM CHLORIDE (POUR BTL) OPTIME
TOPICAL | Status: DC | PRN
  Administered 2016-11-10: 2000 mL

## 2016-11-10 MED ORDER — CEFOTETAN DISODIUM-DEXTROSE 2-2.08 GM-% IV SOLR
INTRAVENOUS | Status: AC
  Filled 2016-11-10: qty 50

## 2016-11-10 MED ORDER — ENOXAPARIN SODIUM 40 MG/0.4ML ~~LOC~~ SOLN
40.0000 mg | SUBCUTANEOUS | Status: DC
  Administered 2016-11-11 – 2016-11-14 (×4): 40 mg via SUBCUTANEOUS
  Filled 2016-11-10 (×4): qty 0.4

## 2016-11-10 MED ORDER — LISINOPRIL 5 MG PO TABS
2.5000 mg | ORAL_TABLET | Freq: Every day | ORAL | Status: DC
  Administered 2016-11-11 – 2016-11-14 (×4): 2.5 mg via ORAL
  Filled 2016-11-10 (×4): qty 1

## 2016-11-10 MED ORDER — EPHEDRINE SULFATE 50 MG/ML IJ SOLN
INTRAMUSCULAR | Status: DC | PRN
  Administered 2016-11-10 (×2): 10 mg via INTRAVENOUS

## 2016-11-10 MED ORDER — PROMETHAZINE HCL 25 MG/ML IJ SOLN
INTRAMUSCULAR | Status: AC
  Administered 2016-11-10: 6.25 mg via INTRAVENOUS
  Filled 2016-11-10: qty 1

## 2016-11-10 MED ORDER — DIPHENHYDRAMINE HCL 12.5 MG/5ML PO ELIX
12.5000 mg | ORAL_SOLUTION | Freq: Four times a day (QID) | ORAL | Status: DC | PRN
  Filled 2016-11-10: qty 5

## 2016-11-10 MED ORDER — SODIUM CHLORIDE 0.9% FLUSH: 9.0000 mL | INTRAVENOUS | Status: DC | PRN

## 2016-11-10 MED ORDER — MORPHINE SULFATE 2 MG/ML IV SOLN
INTRAVENOUS | Status: DC
  Administered 2016-11-10: 13:00:00 via INTRAVENOUS
  Administered 2016-11-10: 7 mg via INTRAVENOUS
  Administered 2016-11-10: 12 mg via INTRAVENOUS
  Administered 2016-11-11: 2 mg via INTRAVENOUS
  Administered 2016-11-11: 3 mg via INTRAVENOUS
  Administered 2016-11-11: 1.9 mg via INTRAVENOUS
  Administered 2016-11-11: 10 mg via INTRAVENOUS
  Administered 2016-11-11: 14:00:00 via INTRAVENOUS
  Administered 2016-11-11: 11 mg via INTRAVENOUS
  Administered 2016-11-11: 2 mg via INTRAVENOUS
  Administered 2016-11-12: 9 mg via INTRAVENOUS
  Administered 2016-11-12: 1.6 mg via INTRAVENOUS
  Administered 2016-11-12: 7 mg via INTRAVENOUS
  Administered 2016-11-12 (×2): 2 mg via INTRAVENOUS
  Administered 2016-11-12: 4 mg via INTRAVENOUS
  Administered 2016-11-13: 0.5 mg via INTRAVENOUS
  Administered 2016-11-13: 1 mg via INTRAVENOUS
  Filled 2016-11-10 (×3): qty 25

## 2016-11-10 MED ORDER — ONDANSETRON HCL 4 MG/2ML IJ SOLN
INTRAMUSCULAR | Status: AC
  Filled 2016-11-10: qty 2

## 2016-11-10 MED ORDER — DIPHENHYDRAMINE HCL 50 MG/ML IJ SOLN
12.5000 mg | Freq: Four times a day (QID) | INTRAMUSCULAR | Status: DC | PRN
  Administered 2016-11-10: 12.5 mg via INTRAVENOUS

## 2016-11-10 MED ORDER — BUPIVACAINE HCL (PF) 0.5 % IJ SOLN
INTRAMUSCULAR | Status: AC
  Filled 2016-11-10: qty 30

## 2016-11-10 MED ORDER — FENTANYL CITRATE (PF) 100 MCG/2ML IJ SOLN
INTRAMUSCULAR | Status: DC | PRN
  Administered 2016-11-10: 50 ug via INTRAVENOUS
  Administered 2016-11-10: 100 ug via INTRAVENOUS
  Administered 2016-11-10 (×6): 50 ug via INTRAVENOUS

## 2016-11-10 MED ORDER — GLYCOPYRROLATE 0.2 MG/ML IJ SOLN
INTRAMUSCULAR | Status: DC | PRN
  Administered 2016-11-10: 0.4 mg via INTRAVENOUS

## 2016-11-10 MED ORDER — LACTATED RINGERS IV SOLN
INTRAVENOUS | Status: DC | PRN
  Administered 2016-11-10 (×3): via INTRAVENOUS

## 2016-11-10 MED ORDER — GLYCOPYRROLATE 0.2 MG/ML IV SOSY
PREFILLED_SYRINGE | INTRAVENOUS | Status: AC
  Filled 2016-11-10: qty 3

## 2016-11-10 MED ORDER — SODIUM CHLORIDE 0.9 % IV SOLN
INTRAVENOUS | Status: DC
  Administered 2016-11-10 – 2016-11-11 (×2): via INTRAVENOUS
  Administered 2016-11-11 (×2): 125 mL/h via INTRAVENOUS
  Administered 2016-11-11 – 2016-11-12 (×3): via INTRAVENOUS
  Administered 2016-11-12: 125 mL/h via INTRAVENOUS
  Administered 2016-11-13 (×2): via INTRAVENOUS

## 2016-11-10 MED ORDER — ROCURONIUM BROMIDE 50 MG/5ML IV SOSY
PREFILLED_SYRINGE | INTRAVENOUS | Status: AC
  Filled 2016-11-10: qty 5

## 2016-11-10 MED ORDER — DEXTROSE 5 % IV SOLN
2.0000 g | INTRAVENOUS | Status: AC
  Administered 2016-11-10: 2 g via INTRAVENOUS
  Filled 2016-11-10: qty 2

## 2016-11-10 MED ORDER — MORPHINE SULFATE (PF) 10 MG/ML IV SOLN
INTRAVENOUS | Status: AC
  Administered 2016-11-10: 2 mg via INTRAVENOUS
  Filled 2016-11-10: qty 1

## 2016-11-10 MED ORDER — ONDANSETRON HCL 4 MG PO TABS: 4.0000 mg | ORAL_TABLET | Freq: Four times a day (QID) | ORAL | Status: DC | PRN

## 2016-11-10 MED ORDER — LACTATED RINGERS IR SOLN
Status: DC | PRN
  Administered 2016-11-10: 1000 mL

## 2016-11-10 MED ORDER — LABETALOL HCL 5 MG/ML IV SOLN
INTRAVENOUS | Status: AC
  Filled 2016-11-10: qty 4

## 2016-11-10 MED ORDER — INSULIN ASPART 100 UNIT/ML ~~LOC~~ SOLN
5.0000 [IU] | Freq: Once | SUBCUTANEOUS | Status: AC
  Administered 2016-11-10: 5 [IU] via SUBCUTANEOUS

## 2016-11-10 MED ORDER — LIDOCAINE 2% (20 MG/ML) 5 ML SYRINGE
INTRAMUSCULAR | Status: DC | PRN
  Administered 2016-11-10: 100 mg via INTRAVENOUS

## 2016-11-10 MED ORDER — NALOXONE HCL 0.4 MG/ML IJ SOLN: 0.4000 mg | INTRAMUSCULAR | Status: DC | PRN

## 2016-11-10 MED ORDER — MORPHINE SULFATE (PF) 10 MG/ML IV SOLN
1.0000 mg | INTRAVENOUS | Status: DC | PRN
  Administered 2016-11-10 (×6): 2 mg via INTRAVENOUS

## 2016-11-10 MED ORDER — PROPOFOL 10 MG/ML IV BOLUS
INTRAVENOUS | Status: AC
  Filled 2016-11-10: qty 20

## 2016-11-10 MED ORDER — ATENOLOL 50 MG PO TABS
50.0000 mg | ORAL_TABLET | Freq: Two times a day (BID) | ORAL | Status: DC
  Administered 2016-11-11: 50 mg via ORAL
  Filled 2016-11-10 (×2): qty 1

## 2016-11-10 MED ORDER — SUGAMMADEX SODIUM 200 MG/2ML IV SOLN
INTRAVENOUS | Status: DC | PRN
  Administered 2016-11-10: 500 mg via INTRAVENOUS

## 2016-11-10 MED ORDER — ALVIMOPAN 12 MG PO CAPS
12.0000 mg | ORAL_CAPSULE | Freq: Two times a day (BID) | ORAL | Status: DC
  Administered 2016-11-11 – 2016-11-12 (×3): 12 mg via ORAL
  Filled 2016-11-10 (×3): qty 1

## 2016-11-10 MED ORDER — ROCURONIUM BROMIDE 50 MG/5ML IV SOSY
PREFILLED_SYRINGE | INTRAVENOUS | Status: DC | PRN
  Administered 2016-11-10 (×2): 10 mg via INTRAVENOUS
  Administered 2016-11-10: 50 mg via INTRAVENOUS
  Administered 2016-11-10 (×3): 10 mg via INTRAVENOUS

## 2016-11-10 MED ORDER — SUCCINYLCHOLINE CHLORIDE 200 MG/10ML IV SOSY
PREFILLED_SYRINGE | INTRAVENOUS | Status: AC
  Filled 2016-11-10: qty 10

## 2016-11-10 MED ORDER — DIPHENHYDRAMINE HCL 50 MG/ML IJ SOLN
INTRAMUSCULAR | Status: AC
  Administered 2016-11-10: 12.5 mg via INTRAVENOUS
  Filled 2016-11-10: qty 1

## 2016-11-10 MED ORDER — ONDANSETRON HCL 4 MG/2ML IJ SOLN
INTRAMUSCULAR | Status: DC | PRN
  Administered 2016-11-10: 4 mg via INTRAVENOUS

## 2016-11-10 MED ORDER — SUGAMMADEX SODIUM 500 MG/5ML IV SOLN
INTRAVENOUS | Status: AC
  Filled 2016-11-10: qty 5

## 2016-11-10 MED ORDER — ONDANSETRON HCL 4 MG/2ML IJ SOLN: 4.0000 mg | Freq: Four times a day (QID) | INTRAMUSCULAR | Status: DC | PRN

## 2016-11-10 MED ORDER — INSULIN ASPART 100 UNIT/ML ~~LOC~~ SOLN
SUBCUTANEOUS | Status: AC
  Filled 2016-11-10: qty 1

## 2016-11-10 MED ORDER — DEXAMETHASONE SODIUM PHOSPHATE 10 MG/ML IJ SOLN
INTRAMUSCULAR | Status: DC | PRN
  Administered 2016-11-10: 10 mg via INTRAVENOUS

## 2016-11-10 MED ORDER — ATENOLOL 50 MG PO TABS
75.0000 mg | ORAL_TABLET | Freq: Once | ORAL | Status: AC
  Administered 2016-11-10: 75 mg via ORAL
  Filled 2016-11-10: qty 1

## 2016-11-10 MED ORDER — FENTANYL CITRATE (PF) 250 MCG/5ML IJ SOLN
INTRAMUSCULAR | Status: AC
  Filled 2016-11-10: qty 5

## 2016-11-10 SURGICAL SUPPLY — 79 items
APL SKNCLS STERI-STRIP NONHPOA (GAUZE/BANDAGES/DRESSINGS) ×1
APPLIER CLIP 5 13 M/L LIGAMAX5 (MISCELLANEOUS)
APPLIER CLIP ROT 10 11.4 M/L (STAPLE)
APR CLP MED LRG 11.4X10 (STAPLE)
BENZOIN TINCTURE PRP APPL 2/3 (GAUZE/BANDAGES/DRESSINGS) ×5 IMPLANT
BLADE EXTENDED COATED 6.5IN (ELECTRODE) IMPLANT
BLADE HEX COATED 2.75 (ELECTRODE) IMPLANT
CABLE HIGH FREQUENCY MONO STRZ (ELECTRODE) ×5 IMPLANT
CELLS DAT CNTRL 66122 CELL SVR (MISCELLANEOUS) IMPLANT
CLIP APPLIE 5 13 M/L LIGAMAX5 (MISCELLANEOUS) IMPLANT
CLIP APPLIE ROT 10 11.4 M/L (STAPLE) IMPLANT
CLOSURE WOUND 1/2 X4 (GAUZE/BANDAGES/DRESSINGS) ×1
COUNTER NEEDLE 20 DBL MAG RED (NEEDLE) ×5 IMPLANT
COVER MAYO STAND STRL (DRAPES) IMPLANT
COVER SURGICAL LIGHT HANDLE (MISCELLANEOUS) ×10 IMPLANT
DECANTER SPIKE VIAL GLASS SM (MISCELLANEOUS) ×5 IMPLANT
DISSECTOR BLUNT TIP ENDO 5MM (MISCELLANEOUS) ×5 IMPLANT
DRAIN CHANNEL 19F RND (DRAIN) IMPLANT
DRAPE INCISE IOBAN 66X45 STRL (DRAPES) ×5 IMPLANT
DRAPE LAPAROSCOPIC ABDOMINAL (DRAPES) ×5 IMPLANT
DRAPE SURG IRRIG POUCH 19X23 (DRAPES) ×5 IMPLANT
DRSG OPSITE POSTOP 4X10 (GAUZE/BANDAGES/DRESSINGS) IMPLANT
DRSG OPSITE POSTOP 4X6 (GAUZE/BANDAGES/DRESSINGS) IMPLANT
DRSG OPSITE POSTOP 4X8 (GAUZE/BANDAGES/DRESSINGS) IMPLANT
ELECT REM PT RETURN 15FT ADLT (MISCELLANEOUS) ×5 IMPLANT
EVACUATOR SILICONE 100CC (DRAIN) IMPLANT
FILTER SMOKE EVAC LAPAROSHD (FILTER) IMPLANT
GAUZE SPONGE 2X2 8PLY STRL LF (GAUZE/BANDAGES/DRESSINGS) ×3 IMPLANT
GAUZE SPONGE 4X4 12PLY STRL (GAUZE/BANDAGES/DRESSINGS) ×5 IMPLANT
GLOVE ECLIPSE 8.0 STRL XLNG CF (GLOVE) ×10 IMPLANT
GLOVE INDICATOR 8.0 STRL GRN (GLOVE) ×10 IMPLANT
GOWN STRL REUS W/TWL XL LVL3 (GOWN DISPOSABLE) ×45 IMPLANT
GRASPER ENDOPATH ANVIL 10MM (MISCELLANEOUS) ×5 IMPLANT
HOLDER FOLEY CATH W/STRAP (MISCELLANEOUS) ×5 IMPLANT
IRRIG SUCT STRYKERFLOW 2 WTIP (MISCELLANEOUS)
IRRIGATION SUCT STRKRFLW 2 WTP (MISCELLANEOUS) IMPLANT
LEGGING LITHOTOMY PAIR STRL (DRAPES) ×5 IMPLANT
LIGASURE IMPACT 36 18CM CVD LR (INSTRUMENTS) IMPLANT
PACK COLON (CUSTOM PROCEDURE TRAY) ×5 IMPLANT
PAD ABD 8X10 STRL (GAUZE/BANDAGES/DRESSINGS) ×5 IMPLANT
PAD POSITIONING PINK XL (MISCELLANEOUS) ×5 IMPLANT
PORT LAP GEL ALEXIS MED 5-9CM (MISCELLANEOUS) ×5 IMPLANT
RTRCTR WOUND ALEXIS 18CM MED (MISCELLANEOUS)
SCISSORS LAP 5X35 DISP (ENDOMECHANICALS) ×5 IMPLANT
SEALER TISSUE X1 CVD JAW (INSTRUMENTS) IMPLANT
SET IRRIG TUBING LAPAROSCOPIC (IRRIGATION / IRRIGATOR) ×5 IMPLANT
SHEARS HARMONIC ACE PLUS 36CM (ENDOMECHANICALS) ×5 IMPLANT
SHEARS HARMONIC ACE PLUS 45CM (MISCELLANEOUS) ×5 IMPLANT
SLEEVE XCEL OPT CAN 5 100 (ENDOMECHANICALS) ×10 IMPLANT
SPONGE DRAIN TRACH 4X4 STRL 2S (GAUZE/BANDAGES/DRESSINGS) IMPLANT
SPONGE GAUZE 2X2 STER 10/PKG (GAUZE/BANDAGES/DRESSINGS) ×2
STAPLER CIRC CVD 29MM 37CM (STAPLE) ×5 IMPLANT
STAPLER PROXIMATE 75MM BLUE (STAPLE) ×5 IMPLANT
STAPLER VISISTAT 35W (STAPLE) ×5 IMPLANT
STRIP CLOSURE SKIN 1/2X4 (GAUZE/BANDAGES/DRESSINGS) ×4 IMPLANT
SUT ETHILON 3 0 PS 1 (SUTURE) IMPLANT
SUT MNCRL AB 4-0 PS2 18 (SUTURE) ×5 IMPLANT
SUT PDS AB 1 CTX 36 (SUTURE) ×5 IMPLANT
SUT PDS AB 1 TP1 96 (SUTURE) IMPLANT
SUT PROLENE 2 0 SH DA (SUTURE) ×5 IMPLANT
SUT SILK 2 0 (SUTURE) ×3
SUT SILK 2 0 SH CR/8 (SUTURE) ×5 IMPLANT
SUT SILK 2-0 18XBRD TIE 12 (SUTURE) ×3 IMPLANT
SUT SILK 3 0 (SUTURE) ×2
SUT SILK 3 0 SH CR/8 (SUTURE) ×5 IMPLANT
SUT SILK 3-0 18XBRD TIE 12 (SUTURE) ×3 IMPLANT
SUT VICRYL 2 0 18  UND BR (SUTURE) ×2
SUT VICRYL 2 0 18 UND BR (SUTURE) ×3 IMPLANT
SYS LAPSCP GELPORT 120MM (MISCELLANEOUS)
SYSTEM LAPSCP GELPORT 120MM (MISCELLANEOUS) IMPLANT
TAPE CLOTH SURG 4X10 WHT LF (GAUZE/BANDAGES/DRESSINGS) ×5 IMPLANT
TOWEL OR 17X26 10 PK STRL BLUE (TOWEL DISPOSABLE) ×5 IMPLANT
TOWEL OR NON WOVEN STRL DISP B (DISPOSABLE) ×5 IMPLANT
TRAY FOLEY BAG SILVER LF 14FR (CATHETERS) IMPLANT
TRAY FOLEY W/METER SILVER 16FR (SET/KITS/TRAYS/PACK) ×5 IMPLANT
TROCAR BLADELESS OPT 5 100 (ENDOMECHANICALS) ×5 IMPLANT
TROCAR XCEL BLUNT TIP 100MML (ENDOMECHANICALS) IMPLANT
TROCAR XCEL NON-BLD 11X100MML (ENDOMECHANICALS) IMPLANT
TUBING INSUF HEATED (TUBING) ×5 IMPLANT

## 2016-11-10 NOTE — Anesthesia Postprocedure Evaluation (Addendum)
Anesthesia Post Note  Patient: Evan Avery  Procedure(s) Performed: Procedure(s) (LRB): LAPAROSCOPIC LYSIS OF ADHESIONS AND  COLOSTOMY CLOSURE (N/A) LAPAROSCOPIC LYSIS OF ADHESIONS PROCTOSCOPY  Patient location during evaluation: PACU Anesthesia Type: General Level of consciousness: sedated, awake and oriented Pain management: pain level controlled Vital Signs Assessment: post-procedure vital signs reviewed and stable Respiratory status: spontaneous breathing, nonlabored ventilation, respiratory function stable and patient connected to nasal cannula oxygen Cardiovascular status: blood pressure returned to baseline and stable Postop Assessment: no signs of nausea or vomiting Anesthetic complications: no       Last Vitals:  Vitals:   11/10/16 1300 11/10/16 1315  BP: (!) 173/79   Pulse: 70   Resp: 10 15  Temp: 36.4 C     Last Pain:  Vitals:   11/10/16 1300  TempSrc:   PainSc: Asleep                 Bekki Tavenner,JAMES TERRILL

## 2016-11-10 NOTE — Transfer of Care (Signed)
Immediate Anesthesia Transfer of Care Note  Patient: MAYFORD BECKSTRAND  Procedure(s) Performed: Procedure(s): LAPAROSCOPIC LYSIS OF ADHESIONS AND  COLOSTOMY CLOSURE (N/A) LAPAROSCOPIC LYSIS OF ADHESIONS PROCTOSCOPY  Patient Location: PACU  Anesthesia Type:General  Level of Consciousness:  sedated, patient cooperative and responds to stimulation  Airway & Oxygen Therapy:Patient Spontanous Breathing and Patient connected to face mask oxgen  Post-op Assessment:  Report given to PACU RN and Post -op Vital signs reviewed and stable  Post vital signs:  Reviewed and stable  Last Vitals:  Vitals:   11/10/16 0510 11/10/16 1143  BP: 136/77   Pulse: 71 72  Resp: 16 14  Temp: 36.6 C [id number]    Complications: No apparent anesthesia complications

## 2016-11-10 NOTE — Interval H&P Note (Signed)
History and Physical Interval Note:  11/10/2016 7:31 AM  Evan Avery  has presented today for surgery, with the diagnosis of COLOSTOMY IS PLACE  The various methods of treatment have been discussed with the patient and family. After consideration of risks, benefits and other options for treatment, the patient has consented to  Procedure(s): LAPAROSCOPIC COLOSTOMY CLOSURE (N/A) as a surgical intervention .  The patient's history has been reviewed, patient examined, no change in status, stable for surgery.  I have reviewed the patient's chart and labs.  Questions were answered to the patient's satisfaction.     Danyelle Brookover [name]

## 2016-11-10 NOTE — Anesthesia Procedure Notes (Signed)
Procedure Name: Intubation Date/Time: 11/10/2016 7:45 AM Performed by: Maxwell Caul Pre-anesthesia Checklist: Patient identified, Emergency Drugs available, Suction available and Patient being monitored Patient Re-evaluated:Patient Re-evaluated prior to inductionOxygen Delivery Method: Circle system utilized Preoxygenation: Pre-oxygenation with 100% oxygen Intubation Type: IV induction Ventilation: Oral airway inserted - appropriate to patient size and Mask ventilation without difficulty Laryngoscope Size: Glidescope and 3 Grade View: Grade I Tube type: Oral Tube size: 7.5 mm Number of attempts: 1 Airway Equipment and Method: Stylet and Oral airway Placement Confirmation: ETT inserted through vocal cords under direct vision,  positive ETCO2 and breath sounds checked- equal and bilateral Secured at: 22 cm Tube secured with: Tape Dental Injury: Teeth and Oropharynx as per pre-operative assessment  Difficulty Due To: Difficulty was anticipated, Difficult Airway- due to reduced neck mobility, Difficult Airway- due to limited oral opening and Difficult Airway- due to anterior larynx Future Recommendations: Recommend- induction with short-acting agent, and alternative techniques readily available Comments: Know difficult intubation. Glidescope #3 utilized-DL X1 with Grade 1 view. ETT 7.5 passed with ease-ETCO2+, BBS=.

## 2016-11-10 NOTE — Anesthesia Preprocedure Evaluation (Addendum)
Anesthesia Evaluation  Patient identified by MRN, date of birth, ID band Patient awake    Reviewed: Allergy & Precautions, NPO status , Patient's Chart, lab work & pertinent test results  History of Anesthesia Complications Negative for: history of anesthetic complications  Airway Mallampati: II  TM Distance: >3 FB Neck ROM: Full  Mouth opening: Limited Mouth Opening  Dental  (+) Teeth Intact   Pulmonary sleep apnea , former smoker,    breath sounds clear to auscultation       Cardiovascular hypertension, + CAD and + Past MI   Rhythm:Regular Rate:Normal     Neuro/Psych    GI/Hepatic Neg liver ROS, Per surgical hx   Endo/Other  diabetes  Renal/GU      Musculoskeletal  (+) Arthritis ,   Abdominal   Peds  Hematology   Anesthesia Other Findings   Reproductive/Obstetrics                            Anesthesia Physical Anesthesia Plan  ASA: III  Anesthesia Plan: General   Post-op Pain Management:    Induction:   Airway Management Planned:   Additional Equipment:   Intra-op Plan:   Post-operative Plan: Extubation in OR  Informed Consent:   Plan Discussed with:   Anesthesia Plan Comments:         Anesthesia Quick Evaluation

## 2016-11-10 NOTE — Progress Notes (Signed)
PHARMACIST - PHYSICIAN ORDER COMMUNICATION  CONCERNING: P&T Medication Policy on Herbal Medications  DESCRIPTION:  This patient's order for:  Co-enzyme Q10 has been noted.  This product(s) is classified as an "herbal" or natural product. Due to a lack of definitive safety studies or FDA approval, nonstandard manufacturing practices, plus the potential risk of unknown drug-drug interactions while on inpatient medications, the Pharmacy and Therapeutics Committee does not permit the use of "herbal" or natural products of this type within [facility].   ACTION TAKEN: The pharmacy department is unable to verify this order at this time and your patient has been informed of this safety policy. Please reevaluate patient's clinical condition at discharge and address if the herbal or natural product(s) should be resumed at that time.   

## 2016-11-10 NOTE — Op Note (Signed)
Operative Note  Evan Avery male 71 y.o. 11/10/2016  PREOPERATIVE DX:  Colostomy in place  POSTOPERATIVE DX:  Same  PROCEDURE:   Laparoscopic lysis of adhesions (2 hours), colostomy closure         Surgeon: Evan Avery   Assistants: Evan Avery, M.D.  Anesthesia: General endotracheal anesthesia  EBL:  200 ml  Indication for assistant:  Dr. Windle Avery was needed to help with adhesio lysis and mobilization of the left colon. She was needed to help with the anastomosis and wound closure.  Indications:   This is a 71 year old male who underwent partial colectomy and descending colostomy for complicated sigmoid diverticulitis in August 2017. His wounds have completely healed and he is back to his baseline strength and energy level as well as activity. He now presents for colostomy closure.    Procedure Detail:  He was brought to the operating room placed supine on the operating table and a general anesthetic was given. He was placed in the lithotomy position. A Foley catheter was inserted. An orogastric tube was inserted. The hair on the abdominal wall was clipped. The colostomy appliance was removed. The abdominal wall and perineal areas were sterilely prepped and draped. A Betadine sponge was placed on the colostomy. An Charlie Pitter was placed over this. A timeout was performed.  He was placed in slight reverse Trendelenburg position. A 5 mm incision was made in the left upper quadrant. Using a 5 mm Optiview trocar and laparoscope, access was gained into the peritoneal cavity and a pneumoperitoneum created. The laparoscope was introduced. There is no underlying organ injury or bleeding. Multiple adhesions between the omentum and abdominal wall as well as small bowel and the abdominal wall were noted. A 5 mm trocar was placed in the right mid lateral abdomen. Using careful blunt and sharp dissection, adhesive lysis was started by freeing up the omentum from the midline and left upper  quadrant abdominal wall. I then added a 5 mm trocar to the right lower quadrant area and one to the midline area.  As I progressed inferiorly, small bowel was directly adherent to the area under the midline incision. These adhesions were mobilized both bluntly and sharply. The colostomy area was identified. Adhesions between the omentum and colon are freed up. Adhesions between the omentum and the lateral aspect of colostomy or repair to. Adhesions between the small bowel and colon and mesocolon were freed up.  I then directed my attention into the pelvis. I identified the suture on the rectal stump. I then began dissecting adhesions between the small bowel and rectum and the mesorectum free using blunt and sharp dissection. I then dissected some adhesions between the small bowel and pelvis out. I subsequently was able to mobilize the rectal stump free from all adhesions of the small bowel. I then mobilized some adhesions laterally and medially given me mobility. The abdominal cavity was inspected. There is no evidence of enterotomy/bowel injury.  Following this, I mobilized the left colon up to the splenic flexure area by dividing lateral attachments. EEA sizers were then brought into the field. Dr. Windle Avery was able to pass a 25 and a 29 EEA sizer through the anus and up to the rectal stump without difficulty. Finding this, an elliptical incision was made around the colostomy and it was dissected free of the subcutaneous tissue and fascia completely freeing it up. I had adequate mobility such that I could take the colon and stretch down to the pubis. A size 29 EEA  anvil was then placed through the colostomy into the left colon. I then resected a small amount of the colostomy using the linear cutting stapler. The anvil was then brought out the side of the colon and anchored to it with a 2-0 Prolene pursestring suture. The colon was then dropped back into the abdomen. An Alexis wound protection device with the Was  then used. The abdomen was reinsufflated. Dr. Windle Avery passed a size 29 EEA handle to the anus and up to the rectum. There is an brought out just anterior to the staple line. An end rectum to side descending colon staple anastomosis was then performed. 2 intact donuts were noted. The anastomosis was under no tension. The air leak test was performed. There is no evidence of leak. Anastomosis was viable and patent.  I then copiously irrigated out the abdomen.  The abdomen was inspected. There is no evidence of bleeding or organ injury. I then introduced a Blake drain into the abdomen through the colostomy site and brought it out the right lower quadrant trocar site. The drain was in position in the pelvis. It was anchored to the skin with 3-0 nylon suture.  The colostomy site fascia was then closed with running #1 PDS suture. Repeat laparoscopy demonstrated a solid fascial closure. 4 quadrant inspection demonstrated no evidence of bleeding or organ injury.  The trocar site incisions were closed with 4-0 Monocryl subcuticular stitches for by Steri-Strips and sterile dressings. The colostomy site was packed with saline moistened gauze followed by dry dressing. A drain sponge was applied to the drain site area. The drain was hooked up to bulb suction.  He tolerated the procedure well without any apparent complications and was taken to the recovery room in satisfactory condition.  Specimens: colostomy        Complications:  * No complications entered in OR log *         Disposition: PACU - hemodynamically stable.         Condition: stable

## 2016-11-10 NOTE — H&P (Signed)
Evan Avery is an 71 y.o. male.   Chief Complaint:  Here for elective surgery HPI: He underwent a partial colectomy and colostomy urgently for complicated sigmoid diverticulitis in August of 2017.  He now presents for elective colostomy closure.  Past Medical History:  Diagnosis Date  . Agent orange exposure 1968  . Arthritis   . CAD (coronary artery disease)    4 stents in right artery  . Complex tear of medial meniscus of left knee as current injury 07/26/2012   [place]  . Diabetes mellitus    type 2  . Glaucoma 2006   on drops, followed by [place]  . Hyperlipidemia   . Hypertension   . Myocardial infarction    1992  . Sleep apnea    uses CPAP regularly    Past Surgical History:  Procedure Laterality Date  . APPENDECTOMY  1958  . BOWEL RESECTION N/A 05/23/2016   Procedure: SMALL BOWEL RESECTION;  Surgeon: Jackolyn Confer, MD;  Location: [facility];  Service: General;  Laterality: N/A;  . CARDIAC CATHETERIZATION  08/11/2008   angiographically patent LAD diagonal, circumflex and multiple circumflex obtuse marginal branches from the L coronary artery; stenting of mid R coronary artery with 3.5 x 24mm Liberte non DES postdilated w/ 4.0 DuraStent [name])  . CARDIOVASCULAR STRESS TEST  06/16/2011   R/P MV - moderate perfusion defect in basal inferoseptal, basal inferior, mid inferseptal and mid inferior regions, consistent with infarct/scar and/or diaphragmatic attenuation; non-gated study secondary to ectopy; no CP or EKG changes for ischemia; abnormal study although no significant changes from previous study; in the absense of gated images cannot calculate EF or distinguish scar/artifact  . COLONOSCOPY  08/2016   TAx5, sessile serrated polyp, diverticulosis, rpt 2 yrs ([facility])  . COLOSTOMY Left 05/23/2016   Procedure: COLOSTOMY;  Surgeon: Jackolyn Confer, MD;  Location: [facility];  Service: General;  Laterality: Left;  . DOPPLER ECHOCARDIOGRAPHY  08/20/2008   EF [id number]; LV systolic function  normal; LV mildly dilated; doppler flow suggestive of impaired LV relaxation; RV mildly dilated, RV systolic function normal, RV systolic pressure normal  . INCISION AND DRAINAGE ABSCESS N/A 05/23/2016   Procedure: DRAINAGE OF MULTIPLE INTRA  ABDOMINAL ABSCESS;  Surgeon: Jackolyn Confer, MD;  Location: [facility];  Service: General;  Laterality: N/A;  . LAPAROTOMY N/A 05/23/2016   Procedure: EXPLORATORY LAPAROTOMY;  Surgeon: Jackolyn Confer, MD;  Location: [facility];  Service: General;  Laterality: N/A;  . PERCUTANEOUS CORONARY STENT INTERVENTION (PCI-S)  1992, 1999, 2004   4 vessels [name])    Family History  Problem Relation Age of Onset  . Alzheimer's disease Mother   . Heart disease Father   . Heart attack Brother   . Diabetes Brother   . Heart disease Maternal Grandmother   . Cancer Maternal Grandfather   . Diabetes Paternal Grandfather    Social History:  reports that he quit smoking about 5 months ago. His smoking use included Cigarettes. He started smoking about 58 years ago. He has a 100.00 pack-year smoking history. He has never used smokeless tobacco. He reports that he drinks alcohol. He reports that he does not use drugs.  Allergies:  Allergies  Allergen Reactions  . Dilaudid [Hydromorphone Hcl] Itching  . Ambien [Zolpidem] Other (See Comments)    Pt states "makes me wild"  . Niacin And Related Other (See Comments)    Extreme fatigue    Medications Prior to Admission  Medication Sig Dispense Refill  . acetaminophen (TYLENOL) 500 MG  tablet Take 1,000 mg by mouth every 6 (six) hours as needed for mild pain.    Marland Kitchen aspirin 81 MG tablet Take 81 mg by mouth daily.      Marland Kitchen atenolol (TENORMIN) 50 MG tablet TAKE ONE & ONE-HALF TABLETS BY MOUTH ONCE DAILY 135 tablet 0  . atorvastatin (LIPITOR) 40 MG tablet TAKE ONE-HALF TABLET BY MOUTH ONCE DAILY (Patient taking differently: TAKE ONE-HALF TABLET= 20mg  BY MOUTH ONCE DAILY) 45 tablet 0  . Brinzolamide-Brimonidine (SIMBRINZA) 1-0.2 % SUSP  Apply 1 drop to eye 3 (three) times daily.     . Cholecalciferol (VITAMIN D PO) Take 2,500 Units by mouth daily.    . clopidogrel (PLAVIX) 75 MG tablet TAKE ONE TABLET BY MOUTH ONCE DAILY (Patient not taking: Reported on 11/07/2016) 90 tablet 0  . Coenzyme Q10 200 MG capsule Take 200 mg by mouth daily.    . fenofibrate 160 MG tablet Take 1 tablet (160 mg total) by mouth daily. 30 tablet 12  . glimepiride (AMARYL) 2 MG tablet TAKE ONE TABLET BY MOUTH ONCE DAILY WITH BREAKFAST 90 tablet 0  . ibuprofen (ADVIL,MOTRIN) 200 MG tablet Take 600 mg by mouth every 6 (six) hours as needed for mild pain.    Marland Kitchen latanoprost (XALATAN) 0.005 % ophthalmic solution Place 1 drop into both eyes at bedtime.     Marland Kitchen lisinopril (PRINIVIL,ZESTRIL) 2.5 MG tablet Take 1 tablet (2.5 mg total) by mouth daily. 90 tablet 2  . metFORMIN (GLUCOPHAGE) 1000 MG tablet Take 1 tablet (1,000 mg total) by mouth 2 (two) times daily with a meal. 180 tablet 1  . omega-3 acid ethyl esters (LOVAZA) 1 g capsule TAKE TWO CAPSULES BY MOUTH TWICE DAILY 360 capsule 0  . senna-docusate (SENOKOT-S) 8.6-50 MG tablet Take 1 tablet by mouth at bedtime as needed for mild constipation. 15 tablet 0    Results for orders placed or performed during the hospital encounter of 11/10/16 (from the past 48 hour(s))  Glucose, capillary     Status: Abnormal   Collection Time: 11/10/16  5:25 AM  Result Value Ref Range   Glucose-Capillary 195 (H) 65 - 99 mg/dL   Comment 1 Notify RN    Comment 2 Document in Chart    No results found.  Review of Systems  Constitutional: Negative for chills and fever.  Gastrointestinal: Negative for nausea and vomiting.    Blood pressure 136/77, pulse 71, temperature 97.9 F (36.6 C), temperature source Oral, resp. rate 16, height 5\' 9"  (1.753 m), weight 112 kg (247 lb), SpO2 99 %. Physical Exam  Constitutional:  Obese male in NAD  HENT:  Head: Normocephalic and atraumatic.  Cardiovascular: Normal rate and regular rhythm.    Respiratory: Effort normal and breath sounds normal.  GI: Soft.  Midline scar.  Left side colostomy  Neurological: He is alert.  Skin: Skin is warm and dry.  Psychiatric: He has a normal mood and affect. His behavior is normal.     Assessment/Plan Colostomy in place.  Plan:  Laparoscopic assisted colostomy closure.  [name], MD 11/10/2016, 7:25 AM

## 2016-11-11 LAB — CBC
HCT: 32 % — ABNORMAL LOW (ref 39.0–52.0)
Hemoglobin: 10.8 g/dL — ABNORMAL LOW (ref 13.0–17.0)
MCH: 30.6 pg (ref 26.0–34.0)
MCHC: 33.8 g/dL (ref 30.0–36.0)
MCV: 90.7 fL (ref 78.0–100.0)
PLATELETS: 146 10*3/uL — AB (ref 150–400)
RBC: 3.53 MIL/uL — AB (ref 4.22–5.81)
RDW: 13.7 % (ref 11.5–15.5)
WBC: 9.7 10*3/uL (ref 4.0–10.5)

## 2016-11-11 LAB — GLUCOSE, CAPILLARY
GLUCOSE-CAPILLARY: 151 mg/dL — AB (ref 65–99)
GLUCOSE-CAPILLARY: 126 mg/dL — AB (ref 65–99)
Glucose-Capillary: 128 mg/dL — ABNORMAL HIGH (ref 65–99)
Glucose-Capillary: 136 mg/dL — ABNORMAL HIGH (ref 65–99)
Glucose-Capillary: 146 mg/dL — ABNORMAL HIGH (ref 65–99)

## 2016-11-11 LAB — BASIC METABOLIC PANEL
Anion gap: 6 (ref 5–15)
BUN: 24 mg/dL — AB (ref 6–20)
CALCIUM: 8.7 mg/dL — AB (ref 8.9–10.3)
CHLORIDE: 107 mmol/L (ref 101–111)
CO2: 25 mmol/L (ref 22–32)
CREATININE: 1.3 mg/dL — AB (ref 0.61–1.24)
GFR calc non Af Amer: 54 mL/min — ABNORMAL LOW (ref >60)
GLUCOSE: 139 mg/dL — AB (ref 65–99)
Potassium: 4.1 mmol/L (ref 3.5–5.1)
Sodium: 138 mmol/L (ref 135–145)

## 2016-11-11 MED ORDER — ATENOLOL 50 MG PO TABS
75.0000 mg | ORAL_TABLET | Freq: Two times a day (BID) | ORAL | Status: DC
  Administered 2016-11-12: 75 mg via ORAL
  Filled 2016-11-11 (×3): qty 2

## 2016-11-11 NOTE — Progress Notes (Signed)
1 Day Post-Op  Subjective: No c/o.  Denies n/v.  Pain controlled.    Objective: Vital signs in last 24 hours: Temp:  [97.5 F (36.4 C)-98.8 F (37.1 C)] 98.6 F (37 C) (01/20 0418) Pulse Rate:  [55-72] 62 (01/20 0950) Resp:  [10-19] 15 (01/20 0816) BP: (107-173)/(55-109) 123/55 (01/20 0950) SpO2:  [91 %-100 %] 99 % (01/20 0418) Last BM Date: 11/09/16  Intake/Output from previous day: 01/19 0701 - 01/20 0700 In: 4499.6 [P.O.:10; I.V.:4439.6; IV Piggyback:50] Out: 958 [Urine:825; Drains:108; Blood:25] Intake/Output this shift: No intake/output data recorded.  General appearance: alert, cooperative and no distress Resp: breathing comfortably GI: soft, nd, nt, dressings c/d/i. Extremities: extremities normal, atraumatic, no cyanosis or edema  Lab Results:   Recent Labs  11/11/16 0523  WBC 9.7  HGB 10.8*  HCT 32.0*  PLT 146*   BMET  Recent Labs  11/11/16 0523  NA 138  K 4.1  CL 107  CO2 25  GLUCOSE 139*  BUN 24*  CREATININE 1.30*  CALCIUM 8.7*   PT/INR No results for input(s): LABPROT, INR in the last 72 hours. ABG No results for input(s): PHART, HCO3 in the last 72 hours.  Invalid input(s): PCO2, PO2  Studies/Results: No results found.  Anti-infectives: Anti-infectives    Start     Dose/Rate Route Frequency Ordered Stop   11/10/16 2000  cefoTEtan (CEFOTAN) 2 g in dextrose 5 % 50 mL IVPB     2 g 100 mL/hr over 30 Minutes Intravenous Every 12 hours 11/10/16 1426 11/10/16 2123   11/10/16 0523  cefoTEtan (CEFOTAN) 2 g in dextrose 5 % 50 mL IVPB     2 g 100 mL/hr over 30 Minutes Intravenous On call to O.R. 11/10/16 [id number] 11/10/16 0750      Assessment/Plan: s/p Procedure(s): LAPAROSCOPIC LYSIS OF ADHESIONS AND  COLOSTOMY CLOSURE (N/A) LAPAROSCOPIC LYSIS OF ADHESIONS PROCTOSCOPY Continue foley due to urinary output monitoring \\leave  on ice chips since long lysis of adhesions.  Ambulate, IS lovenox for vte ppx.   LOS: 1 day     [facility] 11/11/2016

## 2016-11-12 LAB — GLUCOSE, CAPILLARY
GLUCOSE-CAPILLARY: 134 mg/dL — AB (ref 65–99)
GLUCOSE-CAPILLARY: 144 mg/dL — AB (ref 65–99)
GLUCOSE-CAPILLARY: 127 mg/dL — AB (ref 65–99)
Glucose-Capillary: 131 mg/dL — ABNORMAL HIGH (ref 65–99)
Glucose-Capillary: 139 mg/dL — ABNORMAL HIGH (ref 65–99)

## 2016-11-12 LAB — BASIC METABOLIC PANEL
ANION GAP: 9 (ref 5–15)
BUN: 18 mg/dL (ref 6–20)
CALCIUM: 8.5 mg/dL — AB (ref 8.9–10.3)
CHLORIDE: 107 mmol/L (ref 101–111)
CO2: 22 mmol/L (ref 22–32)
Creatinine, Ser: 1.1 mg/dL (ref 0.61–1.24)
GFR calc Af Amer: >60 mL/min (ref >60)
GFR calc non Af Amer: >60 mL/min (ref >60)
GLUCOSE: 138 mg/dL — AB (ref 65–99)
POTASSIUM: 3.7 mmol/L (ref 3.5–5.1)
Sodium: 138 mmol/L (ref 135–145)

## 2016-11-12 NOTE — Progress Notes (Signed)
Pt wants to take his Tenormin once daily in the am like he does at home.  Pharmacy said since its ordered bid, that is the way it has to be given.  Pt would like you to change the Tenormin to once a day in am.

## 2016-11-12 NOTE — Progress Notes (Signed)
2 Days Post-Op  Subjective: Doing well.  Had some flatus.  No n/v.  Pain controlled.  OOB.    Objective: Vital signs in last 24 hours: Temp:  [97.6 F (36.4 C)-99.4 F (37.4 C)] 99.4 F (37.4 C) (01/21 0640) Pulse Rate:  [53-65] 65 (01/21 0640) Resp:  [16-20] 16 (01/21 0640) BP: (101-123)/(43-56) 120/56 (01/21 0640) SpO2:  [93 %-98 %] 93 % (01/21 0640) Last BM Date:  (pta)  Intake/Output from previous day: 01/20 0701 - 01/21 0700 In: 3180 [P.O.:180; I.V.:3000] Out: 1735 [Urine:1700; Drains:35] Intake/Output this shift: No intake/output data recorded.  General appearance: alert, cooperative and no distress Resp: breathing comfortably GI: soft, nd, nt, no wound drainage. Extremities: extremities normal, atraumatic, no cyanosis or edema  Lab Results:   Recent Labs  11/11/16 0523  WBC 9.7  HGB 10.8*  HCT 32.0*  PLT 146*   BMET  Recent Labs  11/11/16 0523 11/12/16 0506  NA 138 138  K 4.1 3.7  CL 107 107  CO2 25 22  GLUCOSE 139* 138*  BUN 24* 18  CREATININE 1.30* 1.10  CALCIUM 8.7* 8.5*   PT/INR No results for input(s): LABPROT, INR in the last 72 hours. ABG No results for input(s): PHART, HCO3 in the last 72 hours.  Invalid input(s): PCO2, PO2  Studies/Results: No results found.  Anti-infectives: Anti-infectives    Start     Dose/Rate Route Frequency Ordered Stop   11/10/16 2000  cefoTEtan (CEFOTAN) 2 g in dextrose 5 % 50 mL IVPB     2 g 100 mL/hr over 30 Minutes Intravenous Every 12 hours 11/10/16 1426 11/10/16 2123   11/10/16 0523  cefoTEtan (CEFOTAN) 2 g in dextrose 5 % 50 mL IVPB     2 g 100 mL/hr over 30 Minutes Intravenous On call to O.R. 11/10/16 0523 11/10/16 0750      Assessment/Plan: s/p Procedure(s): LAPAROSCOPIC LYSIS OF ADHESIONS AND  COLOSTOMY CLOSURE (N/A) LAPAROSCOPIC LYSIS OF ADHESIONS PROCTOSCOPY Clear liquid diet.  Ambulate, IS lovenox for vte ppx.   LOS: 2 days    [facility] 11/12/2016

## 2016-11-12 NOTE — Progress Notes (Signed)
Pt refused his 2200 dose of 75 mg Tenormin.  He says he usually takes it in the am.

## 2016-11-13 LAB — GLUCOSE, CAPILLARY
GLUCOSE-CAPILLARY: 126 mg/dL — AB (ref 65–99)
GLUCOSE-CAPILLARY: 142 mg/dL — AB (ref 65–99)
GLUCOSE-CAPILLARY: 134 mg/dL — AB (ref 65–99)
GLUCOSE-CAPILLARY: 177 mg/dL — AB (ref 65–99)
GLUCOSE-CAPILLARY: 161 mg/dL — AB (ref 65–99)
Glucose-Capillary: 142 mg/dL — ABNORMAL HIGH (ref 65–99)
Glucose-Capillary: 144 mg/dL — ABNORMAL HIGH (ref 65–99)

## 2016-11-13 MED ORDER — ATENOLOL 50 MG PO TABS
75.0000 mg | ORAL_TABLET | Freq: Every day | ORAL | Status: DC
  Administered 2016-11-13: 75 mg via ORAL
  Filled 2016-11-13: qty 2

## 2016-11-13 MED ORDER — ATENOLOL 50 MG PO TABS: 75.0000 mg | ORAL_TABLET | Freq: Every day | ORAL | Status: DC

## 2016-11-13 MED ORDER — PANTOPRAZOLE SODIUM 40 MG PO TBEC
40.0000 mg | DELAYED_RELEASE_TABLET | Freq: Every day | ORAL | Status: DC
  Administered 2016-11-13 – 2016-11-14 (×2): 40 mg via ORAL
  Filled 2016-11-13: qty 1

## 2016-11-13 MED ORDER — MORPHINE SULFATE (PF) 2 MG/ML IV SOLN: 2.0000 mg | INTRAVENOUS | Status: DC | PRN

## 2016-11-13 MED ORDER — INSULIN ASPART 100 UNIT/ML ~~LOC~~ SOLN
0.0000 [IU] | Freq: Three times a day (TID) | SUBCUTANEOUS | Status: DC
  Administered 2016-11-13: 3 [IU] via SUBCUTANEOUS
  Administered 2016-11-13: 2 [IU] via SUBCUTANEOUS
  Administered 2016-11-14 (×2): 3 [IU] via SUBCUTANEOUS

## 2016-11-13 MED ORDER — OXYCODONE HCL 5 MG PO TABS
5.0000 mg | ORAL_TABLET | ORAL | Status: DC | PRN
  Administered 2016-11-13 (×2): 5 mg via ORAL
  Administered 2016-11-13 – 2016-11-14 (×2): 10 mg via ORAL
  Filled 2016-11-13: qty 1
  Filled 2016-11-13: qty 2
  Filled 2016-11-13: qty 1
  Filled 2016-11-13: qty 2

## 2016-11-13 NOTE — Discharge Instructions (Addendum)
[facility], [place] [phone]  COLON SURGERY: POST OP INSTRUCTIONS  Always review your discharge instruction sheet given to you by the facility where your surgery was performed.  IF YOU HAVE DISABILITY OR FAMILY LEAVE FORMS, YOU MUST BRING THEM TO THE OFFICE FOR PROCESSING.  PLEASE DO NOT GIVE THEM TO YOUR DOCTOR.  1. A prescription for pain medication may be given to you upon discharge.  Take your pain medication as prescribed, if needed.  If narcotic pain medicine is not needed, then you may take acetaminophen (Tylenol) or ibuprofen (Advil) as needed. 2. Take your usually prescribed medications unless otherwise directed. 3. If you need a refill on your pain medication, please contact your pharmacy. They will contact our office to request authorization.  Prescriptions will not be filled after 5pm or on week-ends. 4. You should follow lean protein, lowfat diet.  Do not overeat.  Be sure to include lots of fluids daily. Most patients will experience some swelling and bruising in the area of the incision. Ice pack will help. Swelling and bruising can take several days to resolve..  5. It is common to experience some constipation if taking pain medication after surgery.  Increasing fluid intake and taking a stool softener will usually help or prevent this problem from occurring.  A mild laxative (Milk of Magnesia or Miralax) should be taken according to package directions if there are no bowel movements after 48 hours. 6.  You may have steri-strips (small skin tapes) in place directly over the incision.  These strips should be left on the skin.  If your surgeon used skin glue on the incision, you may shower in 24 hours.  The glue will flake off over the next 2-3 weeks.  Any sutures or staples will be removed at the office during your follow-up visit. You may find that a light gauze bandage over your incision may keep your staples from being rubbed or pulled. You may shower and replace  the bandage daily. 7. ACTIVITIES:  You may resume regular (light) daily activities beginning the next day--such as daily self-care, walking, climbing stairs--gradually increasing activities as tolerated.  You may have sexual intercourse when it is comfortable.  Refrain from any heavy lifting or straining for at least 6 weeks.  Do not lift anything over 10 pounds.  a. You may drive when you no longer are taking prescription pain medication, you can comfortably wear a seatbelt, and you can safely maneuver your car and apply brakes b. Return to Work: _When released to do so by doctor.__________________________________ 8. You should see your doctor in the office for a follow-up appointment approximately 2-3 weeks after your surgery.  Make sure that you call for this appointment within a day or two after you arrive home to insure a convenient appointment time. OTHER INSTRUCTIONS:  _Keep blood sugars under good control.__Home heath nurses will help with VAC._______________________________________________________ _____________________________________________________________  WHEN TO CALL YOUR DOCTOR: 1. Fever over 101.0 2. Inability to urinate 3. Nausea and/or vomiting 4. Extreme swelling or bruising 5. Continued bleeding from incision. 6. Increased pain, redness, or drainage from the incision.  The clinic staff is available to answer your questions during regular business hours.  Please dont hesitate to call and ask to speak to one of the nurses if you have concerns.  For further questions, please visit [email or web address]

## 2016-11-13 NOTE — Progress Notes (Signed)
Pt has home CPAP machine and prefers self placement.  RT to monitor and assess as needed.

## 2016-11-13 NOTE — Progress Notes (Signed)
PHARMACIST - PHYSICIAN COMMUNICATION  DR:   [name]  CONCERNING: IV to Oral Route Change Policy  RECOMMENDATION: This patient is receiving Protonix by the intravenous route.  Based on criteria approved by the Pharmacy and Therapeutics Committee, the intravenous medication(s) is/are being converted to the equivalent oral dose form(s).   DESCRIPTION: These criteria include:  The patient is eating (either orally or via tube) and/or has been taking other orally administered medications for a least 24 hours  The patient has no evidence of active gastrointestinal bleeding or impaired GI absorption (gastrectomy, short bowel, patient on TNA or NPO).  If you have questions about this conversion, please contact the Pharmacy Department  []   [phone] )  [facility] []   [phone] )  [facility] []   [phone] )  [facility] []   [phone] )  [facility] [x]   [phone] )  [facility]   11/13/2016 10:14 AM  Peggyann Juba, PharmD, BCPS

## 2016-11-13 NOTE — Progress Notes (Signed)
Assessment Active Problems:   S/p laparoscopic colostomy reversal 11/10/16-progressing well    DM-well controlled with SSI   Plan:  Advance diet.  Oral analgesic. VAC for colostomy site wound.   LOS: 3 days     3 Days Post-Op  Subjective: Having multiple liquid BMs.  Tolerating some liquids.  Objective: Vital signs in last 24 hours: Temp:  [98.5 F (36.9 C)-99 F (37.2 C)] 98.6 F (37 C) (01/22 0502) Pulse Rate:  [60-80] 60 (01/22 0502) Resp:  [16-24] 16 (01/22 0502) BP: (135-155)/(58-62) 135/59 (01/22 0502) SpO2:  [94 %-96 %] 94 % (01/22 0502) Last BM Date:  (pta)  Intake/Output from previous day: 01/21 0701 - 01/22 0700 In: 4480 [P.O.:1480; I.V.:3000] Out: 1172 [Urine:1152; Drains:14; Stool:6] Intake/Output this shift: No intake/output data recorded.  PE: General- In NAD Abdomen-soft, colostomy site wound clean, trocar wounds clean and intact, serosanguinous drain output.  Lab Results:   Recent Labs  11/11/16 0523  WBC 9.7  HGB 10.8*  HCT 32.0*  PLT 146*   BMET  Recent Labs  11/11/16 0523 11/12/16 0506  NA 138 138  K 4.1 3.7  CL 107 107  CO2 25 22  GLUCOSE 139* 138*  BUN 24* 18  CREATININE 1.30* 1.10  CALCIUM 8.7* 8.5*   PT/INR No results for input(s): LABPROT, INR in the last 72 hours. Comprehensive Metabolic Panel:    Component Value Date/Time   NA 138 11/12/2016 0506   NA 138 11/11/2016 0523   K 3.7 11/12/2016 0506   K 4.1 11/11/2016 0523   CL 107 11/12/2016 0506   CL 107 11/11/2016 0523   CO2 22 11/12/2016 0506   CO2 25 11/11/2016 0523   BUN 18 11/12/2016 0506   BUN 24 (H) 11/11/2016 0523   CREATININE 1.10 11/12/2016 0506   CREATININE 1.30 (H) 11/11/2016 0523   CREATININE 0.96 03/13/2016 0803   GLUCOSE 138 (H) 11/12/2016 0506   GLUCOSE 139 (H) 11/11/2016 0523   CALCIUM 8.5 (L) 11/12/2016 0506   CALCIUM 8.7 (L) 11/11/2016 0523   AST 28 11/07/2016 1409   AST 22 06/01/2016 0455   ALT 36 11/07/2016 1409   ALT 26 06/01/2016 0455    ALKPHOS 48 11/07/2016 1409   ALKPHOS 90 06/01/2016 0455   BILITOT 0.4 11/07/2016 1409   BILITOT 0.6 06/01/2016 0455   PROT 7.1 11/07/2016 1409   PROT 5.7 (L) 06/01/2016 0455   ALBUMIN 4.6 11/07/2016 1409   ALBUMIN 2.2 (L) 06/01/2016 0455     Studies/Results: No results found.  Anti-infectives: Anti-infectives    Start     Dose/Rate Route Frequency Ordered Stop   11/10/16 2000  cefoTEtan (CEFOTAN) 2 g in dextrose 5 % 50 mL IVPB     2 g 100 mL/hr over 30 Minutes Intravenous Every 12 hours 11/10/16 1426 11/10/16 2123   11/10/16 0523  cefoTEtan (CEFOTAN) 2 g in dextrose 5 % 50 mL IVPB     2 g 100 mL/hr over 30 Minutes Intravenous On call to O.R. 11/10/16 0523 11/10/16 0750       Toby Breithaupt J 11/13/2016

## 2016-11-13 NOTE — Progress Notes (Signed)
MD paged about patients concern about BP medication atenolol. MD confirmed medication to be given tonight.

## 2016-11-13 NOTE — Consult Note (Signed)
[place] Nurse wound consult note Reason for Consult:Place NPWT to surgical takedown site of colostomy Wound type:surgical Pressure Injury POA: Yes/No Measurement: 3 x 4 x 2.5 Wound bed:red, moist Drainage (amount, consistency, odor) scant serous Periwound:intact, clear Dressing procedure/placement/frequency: 1 ;iece of black foam used to obliterate dead space, patient tolerated removal of saline dressing, placement of foam dressing and initiation of 115mmHg negative pressure well. Bedside RN will manage NPWT moving forward. [place] nursing team will not follow, but will remain available to this patient, the nursing and medical teams.  Please re-consult if needed. Thanks, Maudie Flakes, MSN, RN, [place], Arther Abbott  Pager# [phone]

## 2016-11-14 LAB — GLUCOSE, CAPILLARY
GLUCOSE-CAPILLARY: 153 mg/dL — AB (ref 65–99)
GLUCOSE-CAPILLARY: 192 mg/dL — AB (ref 65–99)

## 2016-11-14 MED ORDER — OXYCODONE HCL 5 MG PO TABS: 5.0000 mg | ORAL_TABLET | ORAL | 0 refills | Status: DC | PRN

## 2016-11-14 NOTE — Care Management Important Message (Addendum)
Important Message  Patient Details IM Letter given to Suzanne/Case Manager to present to Patient Name: Evan Avery MRN: [id number] Date of Birth: 1946-04-05   Medicare Important Message Given:  Yes    Kerin Salen 11/14/2016, 2:09 [place] Message  Patient Details  Name: Evan Avery MRN: [id number] Date of Birth: [birth year]   Medicare Important Message Given:  Yes    Kerin Salen 11/14/2016, 2:07 [place] Message  Patient Details  Name: Evan Avery MRN: [id number] Date of Birth: [birth year]   Medicare Important Message Given:  Yes    Kerin Salen 11/14/2016, 2:07 PM

## 2016-11-14 NOTE — Progress Notes (Signed)
Spoke with patient at bedside. Has been active with [facility] for [facility] services and wants to resume with them. Has vac now, application signed by attending, once insurance verification obtained and home vac applied okay for d/c. Updated patient on process, he is anxious to leave but understanding of process and timeline.

## 2016-11-14 NOTE — Progress Notes (Signed)
Assessment Active Problems:   S/p laparoscopic colostomy reversal 11/10/16-continues to do well    DM-well controlled with SSI   Plan:  Remove drain.  Discharge today. VAC for colostomy site wound at home.  Instructions discussed with him.   LOS: 4 days     4 Days Post-Op  Subjective: Tolerating solid diet and VAC.  Fewer BMs.  Objective: Vital signs in last 24 hours: Temp:  [98.9 F (37.2 C)-99.3 F (37.4 C)] 98.9 F (37.2 C) (01/23 0525) Pulse Rate:  [51-60] 55 (01/23 0525) Resp:  [16-18] 18 (01/23 0525) BP: (130-144)/(50-65) 144/50 (01/23 0525) SpO2:  [96 %-98 %] 98 % (01/23 0525) Last BM Date: 11/13/16  Intake/Output from previous day: 01/22 0701 - 01/23 0700 In: 1766.3 [P.O.:600; I.V.:1166.3] Out: 1130 [Urine:1050; Drains:80] Intake/Output this shift: No intake/output data recorded.  PE: General- In NAD Abdomen-soft, colostomy site wound with VAC on, trocar wounds clean and intact, serosanguinous drain output.  Lab Results:  No results for input(s): WBC, HGB, HCT, PLT in the last 72 hours. BMET  Recent Labs  11/12/16 0506  NA 138  K 3.7  CL 107  CO2 22  GLUCOSE 138*  BUN 18  CREATININE 1.10  CALCIUM 8.5*   PT/INR No results for input(s): LABPROT, INR in the last 72 hours. Comprehensive Metabolic Panel:    Component Value Date/Time   NA 138 11/12/2016 0506   NA 138 11/11/2016 0523   K 3.7 11/12/2016 0506   K 4.1 11/11/2016 0523   CL 107 11/12/2016 0506   CL 107 11/11/2016 0523   CO2 22 11/12/2016 0506   CO2 25 11/11/2016 0523   BUN 18 11/12/2016 0506   BUN 24 (H) 11/11/2016 0523   CREATININE 1.10 11/12/2016 0506   CREATININE 1.30 (H) 11/11/2016 0523   CREATININE 0.96 03/13/2016 0803   GLUCOSE 138 (H) 11/12/2016 0506   GLUCOSE 139 (H) 11/11/2016 0523   CALCIUM 8.5 (L) 11/12/2016 0506   CALCIUM 8.7 (L) 11/11/2016 0523   AST 28 11/07/2016 1409   AST 22 06/01/2016 0455   ALT 36 11/07/2016 1409   ALT 26 06/01/2016 0455   ALKPHOS 48  11/07/2016 1409   ALKPHOS 90 06/01/2016 0455   BILITOT 0.4 11/07/2016 1409   BILITOT 0.6 06/01/2016 0455   PROT 7.1 11/07/2016 1409   PROT 5.7 (L) 06/01/2016 0455   ALBUMIN 4.6 11/07/2016 1409   ALBUMIN 2.2 (L) 06/01/2016 0455     Studies/Results: No results found.  Anti-infectives: Anti-infectives    Start     Dose/Rate Route Frequency Ordered Stop   11/10/16 2000  cefoTEtan (CEFOTAN) 2 g in dextrose 5 % 50 mL IVPB     2 g 100 mL/hr over 30 Minutes Intravenous Every 12 hours 11/10/16 1426 11/10/16 2123   11/10/16 0523  cefoTEtan (CEFOTAN) 2 g in dextrose 5 % 50 mL IVPB     2 g 100 mL/hr over 30 Minutes Intravenous On call to O.R. 11/10/16 0523 11/10/16 0750       Talulah Schirmer J 11/14/2016

## 2016-11-14 NOTE — Progress Notes (Signed)
Discharge instructions and prescriptions given to patient questions answered

## 2016-11-14 NOTE — Consult Note (Signed)
   [facility] CM Inpatient Consult   11/14/2016  Evan Avery 09/16/1946 [id number]    Came to bedside to speak with Evan Avery about re-engaging with [facility] program. He was active with [facility] in the past.  However, Evan Avery denies needing any additional follow up post [facility] program. He reports he is doing a lot better than last time he was in the hospital. Accepted [facility] brochure with contact information to contact in the future if needed. Appreciative of visit. Made inpatient RNCM aware that Evan Avery pleasantly declined [facility] program services.    Evan Rolling, MSN-Ed, RN,BSN [facility] Liaison [phone]

## 2016-11-15 ENCOUNTER — Telehealth: Payer: Self-pay | Admitting: *Deleted

## 2016-11-16 DIAGNOSIS — Z7982 Long term (current) use of aspirin: Secondary | ICD-10-CM | POA: Diagnosis not present

## 2016-11-16 DIAGNOSIS — I10 Essential (primary) hypertension: Secondary | ICD-10-CM | POA: Diagnosis not present

## 2016-11-16 DIAGNOSIS — G4730 Sleep apnea, unspecified: Secondary | ICD-10-CM | POA: Diagnosis not present

## 2016-11-16 DIAGNOSIS — Z77098 Contact with and (suspected) exposure to other hazardous, chiefly nonmedicinal, chemicals: Secondary | ICD-10-CM | POA: Diagnosis not present

## 2016-11-16 DIAGNOSIS — M1991 Primary osteoarthritis, unspecified site: Secondary | ICD-10-CM | POA: Diagnosis not present

## 2016-11-16 DIAGNOSIS — I2510 Atherosclerotic heart disease of native coronary artery without angina pectoris: Secondary | ICD-10-CM | POA: Diagnosis not present

## 2016-11-16 DIAGNOSIS — K5792 Diverticulitis of intestine, part unspecified, without perforation or abscess without bleeding: Secondary | ICD-10-CM | POA: Diagnosis not present

## 2016-11-16 DIAGNOSIS — Z7902 Long term (current) use of antithrombotics/antiplatelets: Secondary | ICD-10-CM | POA: Diagnosis not present

## 2016-11-16 DIAGNOSIS — Z48815 Encounter for surgical aftercare following surgery on the digestive system: Secondary | ICD-10-CM | POA: Diagnosis not present

## 2016-11-16 DIAGNOSIS — Z7984 Long term (current) use of oral hypoglycemic drugs: Secondary | ICD-10-CM | POA: Diagnosis not present

## 2016-11-16 DIAGNOSIS — E119 Type 2 diabetes mellitus without complications: Secondary | ICD-10-CM | POA: Diagnosis not present

## 2016-11-16 DIAGNOSIS — Z79891 Long term (current) use of opiate analgesic: Secondary | ICD-10-CM | POA: Diagnosis not present

## 2016-11-16 DIAGNOSIS — E785 Hyperlipidemia, unspecified: Secondary | ICD-10-CM | POA: Diagnosis not present

## 2016-11-16 DIAGNOSIS — I252 Old myocardial infarction: Secondary | ICD-10-CM | POA: Diagnosis not present

## 2016-11-16 DIAGNOSIS — Z933 Colostomy status: Secondary | ICD-10-CM | POA: Diagnosis not present

## 2016-11-18 DIAGNOSIS — Z7984 Long term (current) use of oral hypoglycemic drugs: Secondary | ICD-10-CM | POA: Diagnosis not present

## 2016-11-18 DIAGNOSIS — I252 Old myocardial infarction: Secondary | ICD-10-CM | POA: Diagnosis not present

## 2016-11-18 DIAGNOSIS — K5792 Diverticulitis of intestine, part unspecified, without perforation or abscess without bleeding: Secondary | ICD-10-CM | POA: Diagnosis not present

## 2016-11-18 DIAGNOSIS — Z79891 Long term (current) use of opiate analgesic: Secondary | ICD-10-CM | POA: Diagnosis not present

## 2016-11-18 DIAGNOSIS — I10 Essential (primary) hypertension: Secondary | ICD-10-CM | POA: Diagnosis not present

## 2016-11-18 DIAGNOSIS — Z933 Colostomy status: Secondary | ICD-10-CM | POA: Diagnosis not present

## 2016-11-18 DIAGNOSIS — Z48815 Encounter for surgical aftercare following surgery on the digestive system: Secondary | ICD-10-CM | POA: Diagnosis not present

## 2016-11-18 DIAGNOSIS — I2510 Atherosclerotic heart disease of native coronary artery without angina pectoris: Secondary | ICD-10-CM | POA: Diagnosis not present

## 2016-11-18 DIAGNOSIS — Z77098 Contact with and (suspected) exposure to other hazardous, chiefly nonmedicinal, chemicals: Secondary | ICD-10-CM | POA: Diagnosis not present

## 2016-11-18 DIAGNOSIS — Z7982 Long term (current) use of aspirin: Secondary | ICD-10-CM | POA: Diagnosis not present

## 2016-11-18 DIAGNOSIS — Z7902 Long term (current) use of antithrombotics/antiplatelets: Secondary | ICD-10-CM | POA: Diagnosis not present

## 2016-11-18 DIAGNOSIS — G4730 Sleep apnea, unspecified: Secondary | ICD-10-CM | POA: Diagnosis not present

## 2016-11-18 DIAGNOSIS — E785 Hyperlipidemia, unspecified: Secondary | ICD-10-CM | POA: Diagnosis not present

## 2016-11-18 DIAGNOSIS — E119 Type 2 diabetes mellitus without complications: Secondary | ICD-10-CM | POA: Diagnosis not present

## 2016-11-18 DIAGNOSIS — M1991 Primary osteoarthritis, unspecified site: Secondary | ICD-10-CM | POA: Diagnosis not present

## 2016-11-21 DIAGNOSIS — I252 Old myocardial infarction: Secondary | ICD-10-CM | POA: Diagnosis not present

## 2016-11-21 DIAGNOSIS — E119 Type 2 diabetes mellitus without complications: Secondary | ICD-10-CM | POA: Diagnosis not present

## 2016-11-21 DIAGNOSIS — Z7982 Long term (current) use of aspirin: Secondary | ICD-10-CM | POA: Diagnosis not present

## 2016-11-21 DIAGNOSIS — Z933 Colostomy status: Secondary | ICD-10-CM | POA: Diagnosis not present

## 2016-11-21 DIAGNOSIS — E785 Hyperlipidemia, unspecified: Secondary | ICD-10-CM | POA: Diagnosis not present

## 2016-11-21 DIAGNOSIS — Z7984 Long term (current) use of oral hypoglycemic drugs: Secondary | ICD-10-CM | POA: Diagnosis not present

## 2016-11-21 DIAGNOSIS — K5792 Diverticulitis of intestine, part unspecified, without perforation or abscess without bleeding: Secondary | ICD-10-CM | POA: Diagnosis not present

## 2016-11-21 DIAGNOSIS — Z48815 Encounter for surgical aftercare following surgery on the digestive system: Secondary | ICD-10-CM | POA: Diagnosis not present

## 2016-11-21 DIAGNOSIS — Z7902 Long term (current) use of antithrombotics/antiplatelets: Secondary | ICD-10-CM | POA: Diagnosis not present

## 2016-11-21 DIAGNOSIS — I10 Essential (primary) hypertension: Secondary | ICD-10-CM | POA: Diagnosis not present

## 2016-11-21 DIAGNOSIS — I2510 Atherosclerotic heart disease of native coronary artery without angina pectoris: Secondary | ICD-10-CM | POA: Diagnosis not present

## 2016-11-21 DIAGNOSIS — G4730 Sleep apnea, unspecified: Secondary | ICD-10-CM | POA: Diagnosis not present

## 2016-11-21 DIAGNOSIS — M1991 Primary osteoarthritis, unspecified site: Secondary | ICD-10-CM | POA: Diagnosis not present

## 2016-11-21 DIAGNOSIS — Z79891 Long term (current) use of opiate analgesic: Secondary | ICD-10-CM | POA: Diagnosis not present

## 2016-11-21 DIAGNOSIS — Z77098 Contact with and (suspected) exposure to other hazardous, chiefly nonmedicinal, chemicals: Secondary | ICD-10-CM | POA: Diagnosis not present

## 2016-11-21 NOTE — Discharge Summary (Signed)
Physician Discharge Summary  Patient ID: Evan Avery MRN: [id number] DOB/AGE: 1946-04-14 71 y.o.  Admit date: 11/10/2016 Discharge date: 11/14/2016  Admission Diagnoses:  Colostomy in place  Discharge Diagnoses:  Active Problems:   Colostomy in place [facility])   Type II DM    HTN    Obesity    CAD   Discharged Condition: good  Hospital Course: He underwent laparoscopic colostomy closure 11/10/16.  He was placed on the postop colorectal surgery pathway and did well.  A VAC was applied to the colostomy site wound.  By POD #4 he met discharge criteria and was discharge to home with the [facility] on.  Discharge instructions were given to him.  HHRN will help with VAC.   Discharge Exam: Blood pressure (!) 145/60, pulse (!) 55, temperature 98.1 F (36.7 C), temperature source Oral, resp. rate 18, height 5' 9"  (1.753 m), weight 112 kg (247 lb), SpO2 97 %.   Disposition: 01-Home or Self Care   Allergies as of 11/14/2016      Reactions   Dilaudid [hydromorphone Hcl] Itching   Ambien [zolpidem] Other (See Comments)   Pt states "makes me wild"   Niacin And Related Other (See Comments)   Extreme fatigue      Medication List    TAKE these medications   acetaminophen 500 MG tablet Commonly known as:  TYLENOL Take 1,000 mg by mouth every 6 (six) hours as needed for mild pain.   aspirin 81 MG tablet Take 81 mg by mouth daily.   atenolol 50 MG tablet Commonly known as:  TENORMIN TAKE ONE & ONE-HALF TABLETS BY MOUTH ONCE DAILY   atorvastatin 40 MG tablet Commonly known as:  LIPITOR TAKE ONE-HALF TABLET BY MOUTH ONCE DAILY What changed:  See the new instructions.   clopidogrel 75 MG tablet Commonly known as:  PLAVIX TAKE ONE TABLET BY MOUTH ONCE DAILY   Coenzyme Q10 200 MG capsule Take 200 mg by mouth daily.   fenofibrate 160 MG tablet Take 1 tablet (160 mg total) by mouth daily.   glimepiride 2 MG tablet Commonly known as:  AMARYL TAKE ONE TABLET BY MOUTH ONCE DAILY WITH  BREAKFAST   ibuprofen 200 MG tablet Commonly known as:  ADVIL,MOTRIN Take 600 mg by mouth every 6 (six) hours as needed for mild pain.   latanoprost 0.005 % ophthalmic solution Commonly known as:  XALATAN Place 1 drop into both eyes at bedtime.   lisinopril 2.5 MG tablet Commonly known as:  PRINIVIL,ZESTRIL Take 1 tablet (2.5 mg total) by mouth daily.   metFORMIN 1000 MG tablet Commonly known as:  GLUCOPHAGE Take 1 tablet (1,000 mg total) by mouth 2 (two) times daily with a meal.   omega-3 acid ethyl esters 1 g capsule Commonly known as:  LOVAZA TAKE TWO CAPSULES BY MOUTH TWICE DAILY   oxyCODONE 5 MG immediate release tablet Commonly known as:  Oxy IR/ROXICODONE Take 1-2 tablets (5-10 mg total) by mouth every 4 (four) hours as needed for moderate pain.   senna-docusate 8.6-50 MG tablet Commonly known as:  Senokot-S Take 1 tablet by mouth at bedtime as needed for mild constipation.   SIMBRINZA 1-0.2 % Susp Generic drug:  Brinzolamide-Brimonidine Apply 1 drop to eye 3 (three) times daily.   VITAMIN D PO Take 2,500 Units by mouth daily.      Follow-up Information    [facility] Follow up.   Why:  nurse to assist with wound vac Contact information: [address]  [place] [address] [place] Follow up.   Why:  wound vac Contact information: [address] [place] [address] [phone]           Signed: [name] 11/21/2016, 7:35 AM

## 2016-11-24 ENCOUNTER — Telehealth: Payer: Self-pay | Admitting: Cardiovascular Disease

## 2016-11-24 DIAGNOSIS — Z77098 Contact with and (suspected) exposure to other hazardous, chiefly nonmedicinal, chemicals: Secondary | ICD-10-CM | POA: Diagnosis not present

## 2016-11-24 DIAGNOSIS — E119 Type 2 diabetes mellitus without complications: Secondary | ICD-10-CM | POA: Diagnosis not present

## 2016-11-24 DIAGNOSIS — G4730 Sleep apnea, unspecified: Secondary | ICD-10-CM | POA: Diagnosis not present

## 2016-11-24 DIAGNOSIS — I2510 Atherosclerotic heart disease of native coronary artery without angina pectoris: Secondary | ICD-10-CM | POA: Diagnosis not present

## 2016-11-24 DIAGNOSIS — Z48815 Encounter for surgical aftercare following surgery on the digestive system: Secondary | ICD-10-CM | POA: Diagnosis not present

## 2016-11-24 DIAGNOSIS — Z7982 Long term (current) use of aspirin: Secondary | ICD-10-CM | POA: Diagnosis not present

## 2016-11-24 DIAGNOSIS — Z7902 Long term (current) use of antithrombotics/antiplatelets: Secondary | ICD-10-CM | POA: Diagnosis not present

## 2016-11-24 DIAGNOSIS — Z933 Colostomy status: Secondary | ICD-10-CM | POA: Diagnosis not present

## 2016-11-24 DIAGNOSIS — Z79891 Long term (current) use of opiate analgesic: Secondary | ICD-10-CM | POA: Diagnosis not present

## 2016-11-24 DIAGNOSIS — M1991 Primary osteoarthritis, unspecified site: Secondary | ICD-10-CM | POA: Diagnosis not present

## 2016-11-24 DIAGNOSIS — I10 Essential (primary) hypertension: Secondary | ICD-10-CM | POA: Diagnosis not present

## 2016-11-24 DIAGNOSIS — I252 Old myocardial infarction: Secondary | ICD-10-CM | POA: Diagnosis not present

## 2016-11-24 DIAGNOSIS — K5792 Diverticulitis of intestine, part unspecified, without perforation or abscess without bleeding: Secondary | ICD-10-CM | POA: Diagnosis not present

## 2016-11-24 DIAGNOSIS — E785 Hyperlipidemia, unspecified: Secondary | ICD-10-CM | POA: Diagnosis not present

## 2016-11-24 DIAGNOSIS — Z7984 Long term (current) use of oral hypoglycemic drugs: Secondary | ICD-10-CM | POA: Diagnosis not present

## 2016-11-24 NOTE — Telephone Encounter (Signed)
Spoke to patient's home health RN. She states she's assessed patient in home on 3 separate dates, his HR readings are low each time. She notes he has an issue w medication compliance, sometimes decides to hold his atenolol. Wonders if he needs a dose reduction from the 75mg  daily as his baseline HR is low (48-60) w/o atenolol. He also holds lisinopril sometimes.  She is no longer going to be coming to house for assessments, patient is being discharged from home health services after meeting goal criteria.  Pt does not c/o symptoms, other than fatigue, which has been ongoing and seems to be associated more w timing of a recent colostomy reversal procedure. Denies SOB, lightheadedness syncope, chest pain, etc.  Offered to schedule appt w APP as it's been several months since patient has been in office. She informs me he's leaving for trip this weekend - uncertain of his return date.   Attempted to reach patient at home today, no answer when dialed.  Routed to Dr. [name] for further advice.

## 2016-11-24 NOTE — Telephone Encounter (Signed)
New Message      Per care giver pt did not take  atenolol (TENORMIN) 50 MG tablet TAKE ONE & ONE-HALF TABLETS BY MOUTH ONCE DAILY   Lisinopril   His pressures today is 160/82 heart rate 48, 1/30 did not take either meds 160/74 heart rate 52 , 1/27 152/72 heart rate 51 , patient is stating he is tired and weak   Patient needs medication adjusted accordingly

## 2016-11-24 NOTE — Telephone Encounter (Signed)
Left msg to call.

## 2016-11-28 NOTE — Telephone Encounter (Signed)
Ok to reduce atenolol from 75 mg to 50 mg, If P consistenly < 54 can try to reduce to 25 mg

## 2016-11-29 ENCOUNTER — Other Ambulatory Visit: Payer: Self-pay | Admitting: Family Medicine

## 2016-11-29 DIAGNOSIS — E1165 Type 2 diabetes mellitus with hyperglycemia: Secondary | ICD-10-CM

## 2016-11-29 DIAGNOSIS — Z1159 Encounter for screening for other viral diseases: Secondary | ICD-10-CM

## 2016-11-29 DIAGNOSIS — IMO0002 Reserved for concepts with insufficient information to code with codable children: Secondary | ICD-10-CM

## 2016-11-29 DIAGNOSIS — Z125 Encounter for screening for malignant neoplasm of prostate: Secondary | ICD-10-CM

## 2016-11-29 DIAGNOSIS — E118 Type 2 diabetes mellitus with unspecified complications: Principal | ICD-10-CM

## 2016-11-29 DIAGNOSIS — E785 Hyperlipidemia, unspecified: Secondary | ICD-10-CM

## 2016-11-29 NOTE — Telephone Encounter (Signed)
Left msg for patient to call. 

## 2016-11-29 NOTE — Telephone Encounter (Signed)
Left msg for Evan Avery, HHRN w advisements. Will try to reach patient also.

## 2016-11-30 ENCOUNTER — Ambulatory Visit: Payer: PPO

## 2016-12-05 ENCOUNTER — Other Ambulatory Visit: Payer: Self-pay | Admitting: Family Medicine

## 2016-12-05 NOTE — Telephone Encounter (Signed)
Left msg for patient to call. 

## 2016-12-06 ENCOUNTER — Ambulatory Visit (INDEPENDENT_AMBULATORY_CARE_PROVIDER_SITE_OTHER): Payer: PPO

## 2016-12-06 VITALS — BP 130/70 | HR 53 | Temp 97.9°F | Ht 68.0 in | Wt 247.2 lb

## 2016-12-06 DIAGNOSIS — E785 Hyperlipidemia, unspecified: Secondary | ICD-10-CM

## 2016-12-06 DIAGNOSIS — E1165 Type 2 diabetes mellitus with hyperglycemia: Secondary | ICD-10-CM

## 2016-12-06 DIAGNOSIS — Z0000 Encounter for general adult medical examination without abnormal findings: Secondary | ICD-10-CM | POA: Diagnosis not present

## 2016-12-06 DIAGNOSIS — E118 Type 2 diabetes mellitus with unspecified complications: Secondary | ICD-10-CM | POA: Diagnosis not present

## 2016-12-06 DIAGNOSIS — Z125 Encounter for screening for malignant neoplasm of prostate: Secondary | ICD-10-CM

## 2016-12-06 DIAGNOSIS — Z1159 Encounter for screening for other viral diseases: Secondary | ICD-10-CM

## 2016-12-06 DIAGNOSIS — IMO0002 Reserved for concepts with insufficient information to code with codable children: Secondary | ICD-10-CM

## 2016-12-06 LAB — COMPREHENSIVE METABOLIC PANEL
ALK PHOS: 43 U/L (ref 39–117)
ALT: 22 U/L (ref 0–53)
AST: 16 U/L (ref 0–37)
Albumin: 4.4 g/dL (ref 3.5–5.2)
BILIRUBIN TOTAL: 0.4 mg/dL (ref 0.2–1.2)
BUN: 17 mg/dL (ref 6–23)
CALCIUM: 9.7 mg/dL (ref 8.4–10.5)
CO2: 30 meq/L (ref 19–32)
CREATININE: 1.11 mg/dL (ref 0.40–1.50)
Chloride: 107 mEq/L (ref 96–112)
GFR: 69.34 mL/min (ref >60.00)
Glucose, Bld: 175 mg/dL — ABNORMAL HIGH (ref 70–99)
Potassium: 4.5 mEq/L (ref 3.5–5.1)
Sodium: 142 mEq/L (ref 135–145)
TOTAL PROTEIN: 6.6 g/dL (ref 6.0–8.3)

## 2016-12-06 LAB — LIPID PANEL
CHOL/HDL RATIO: 4
Cholesterol: 129 mg/dL (ref 0–200)
HDL: 35.3 mg/dL — ABNORMAL LOW (ref >39.00)
LDL Cholesterol: 69 mg/dL (ref 0–99)
NONHDL: 93.41
TRIGLYCERIDES: 120 mg/dL (ref 0.0–149.0)
VLDL: 24 mg/dL (ref 0.0–40.0)

## 2016-12-06 LAB — PSA, MEDICARE: PSA: 0.54 ng/mL (ref 0.10–4.00)

## 2016-12-06 NOTE — Progress Notes (Signed)
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   Nurse concerns:  None  Next PCP appt:   12/07/16 @ 1000

## 2016-12-06 NOTE — Patient Instructions (Signed)
Evan Avery , Thank you for taking time to come for your Medicare Wellness Visit. I appreciate your ongoing commitment to your health goals. Please review the following plan we discussed and let me know if I can assist you in the future.   These are the goals we discussed: Goals    . Increase physical activity          Starting 12/06/16, I will continue to walk at least 30 min 2 days per week.        This is a list of the screening recommended for you and due dates:  Health Maintenance  Topic Date Due  . Tetanus Vaccine  10/23/2022*  . Hemoglobin A1C  05/07/2017  . Eye exam for diabetics  07/23/2017  . Complete foot exam   10/23/2017  . Colon Cancer Screening  09/13/2018  . DTaP/Tdap/Td vaccine (2 - Tdap) 10/23/2022  . Flu Shot  Completed  .  Hepatitis C: One time screening is recommended by Center for Disease Control  (CDC) for  adults born from [birth year] through 1966.   Completed  . Pneumonia vaccines  Completed  *Topic was postponed. The date shown is not the original due date.   Preventive Care for Adults  A healthy lifestyle and preventive care can promote health and wellness. Preventive health guidelines for adults include the following key practices.  . A routine yearly physical is a good way to check with your health care provider about your health and preventive screening. It is a chance to share any concerns and updates on your health and to receive a thorough exam.  . Visit your dentist for a routine exam and preventive care every 6 months. Brush your teeth twice a day and floss once a day. Good oral hygiene prevents tooth decay and gum disease.  . The frequency of eye exams is based on your age, health, family medical history, use  of contact lenses, and other factors. Follow your health care provider's ecommendations for frequency of eye exams.  . Eat a healthy diet. Foods like vegetables, fruits, whole grains, low-fat dairy products, and lean protein foods contain the  nutrients you need without too many calories. Decrease your intake of foods high in solid fats, added sugars, and salt. Eat the right amount of calories for you. Get information about a proper diet from your health care provider, if necessary.  . Regular physical exercise is one of the most important things you can do for your health. Most adults should get at least 150 minutes of moderate-intensity exercise (any activity that increases your heart rate and causes you to sweat) each week. In addition, most adults need muscle-strengthening exercises on 2 or more days a week.  Silver Sneakers may be a benefit available to you. To determine eligibility, you may visit the website: www.silversneakers.com or contact program at 7732097229 Mon-Fri between 8AM-8PM.   . Maintain a healthy weight. The body mass index (BMI) is a screening tool to identify possible weight problems. It provides an estimate of body fat based on height and weight. Your health care provider can find your BMI and can help you achieve or maintain a healthy weight.   For adults 20 years and older: ? A BMI below 18.5 is considered underweight. ? A BMI of 18.5 to 24.9 is normal. ? A BMI of 25 to 29.9 is considered overweight. ? A BMI of 30 and above is considered obese.   . Maintain normal blood lipids and cholesterol levels by  exercising and minimizing your intake of saturated fat. Eat a balanced diet with plenty of fruit and vegetables. Blood tests for lipids and cholesterol should begin at age 80 and be repeated every 5 years. If your lipid or cholesterol levels are high, you are over 50, or you are at high risk for heart disease, you may need your cholesterol levels checked more frequently. Ongoing high lipid and cholesterol levels should be treated with medicines if diet and exercise are not working.  . If you smoke, find out from your health care provider how to quit. If you do not use tobacco, please do not start.  . If you  choose to drink alcohol, please do not consume more than 2 drinks per day. One drink is considered to be 12 ounces (355 mL) of beer, 5 ounces (148 mL) of wine, or 1.5 ounces (44 mL) of liquor.  . If you are 79-49 years old, ask your health care provider if you should take aspirin to prevent strokes.  . Use sunscreen. Apply sunscreen liberally and repeatedly throughout the day. You should seek shade when your shadow is shorter than you. Protect yourself by wearing long sleeves, pants, a wide-brimmed hat, and sunglasses year round, whenever you are outdoors.  . Once a month, do a whole body skin exam, using a mirror to look at the skin on your back. Tell your health care provider of new moles, moles that have irregular borders, moles that are larger than a pencil eraser, or moles that have changed in shape or color.

## 2016-12-06 NOTE — Progress Notes (Signed)
I reviewed health advisor's note, was available for consultation, and agree with documentation and plan.  

## 2016-12-06 NOTE — Progress Notes (Signed)
Pre visit review using our clinic review tool, if applicable. No additional management support is needed unless otherwise documented below in the visit note. 

## 2016-12-06 NOTE — Progress Notes (Signed)
Subjective:   Evan Avery is a 71 y.o. male who presents for Medicare Annual/Subsequent preventive examination.  Review of Systems:  N/A Cardiac Risk Factors include: advanced age (>73men, >94 women);obesity (BMI >30kg/m2);diabetes mellitus;male gender;dyslipidemia;hypertension     Objective:    Vitals: BP 130/70 (BP Location: Right Arm, Patient Position: Sitting, Cuff Size: Normal)   Pulse (!) 53   Temp 97.9 F (36.6 C) (Oral)   Ht 5\' 8"  (1.727 m) Comment: no shoes  Wt 247 lb 4 oz (112.2 kg)   SpO2 95%   BMI 37.59 kg/m   Body mass index is 37.59 kg/m.  Tobacco History  Smoking Status  . Former Smoker  . Packs/day: 2.00  . Years: 50.00  . Types: Cigarettes  . Start date: 10/23/1958  . Quit date: 05/16/2016  Smokeless Tobacco  . Never Used     Counseling given: No   Past Medical History:  Diagnosis Date  . Agent orange exposure 1968  . Arthritis   . CAD (coronary artery disease)    4 stents in right artery  . Complex tear of medial meniscus of left knee as current injury 07/26/2012   [place]  . Diabetes mellitus    type 2  . Glaucoma 2006   on drops, followed by [place]  . Hyperlipidemia   . Hypertension   . Myocardial infarction    1992  . Sleep apnea    uses CPAP regularly   Past Surgical History:  Procedure Laterality Date  . APPENDECTOMY  1958  . BOWEL RESECTION N/A 05/23/2016   Procedure: SMALL BOWEL RESECTION;  Surgeon: Jackolyn Confer, MD;  Location: [facility];  Service: General;  Laterality: N/A;  . CARDIAC CATHETERIZATION  08/11/2008   angiographically patent LAD diagonal, circumflex and multiple circumflex obtuse marginal branches from the L coronary artery; stenting of mid R coronary artery with 3.5 x 11mm Liberte non DES postdilated w/ 4.0 DuraStent [name])  . CARDIOVASCULAR STRESS TEST  06/16/2011   R/P MV - moderate perfusion defect in basal inferoseptal, basal inferior, mid inferseptal and mid inferior regions, consistent with infarct/scar and/or  diaphragmatic attenuation; non-gated study secondary to ectopy; no CP or EKG changes for ischemia; abnormal study although no significant changes from previous study; in the absense of gated images cannot calculate EF or distinguish scar/artifact  . COLONOSCOPY  08/2016   TAx5, sessile serrated polyp, diverticulosis, rpt 2 yrs (Outlaw)  . COLOSTOMY Left 05/23/2016   Procedure: COLOSTOMY;  Surgeon: Jackolyn Confer, MD;  Location: [facility];  Service: General;  Laterality: Left;  . COLOSTOMY TAKEDOWN N/A 11/10/2016   Procedure: LAPAROSCOPIC LYSIS OF ADHESIONS AND  COLOSTOMY CLOSURE;  Surgeon: Jackolyn Confer, MD;  Location: [facility];  Service: General;  Laterality: N/A;  . DOPPLER ECHOCARDIOGRAPHY  08/20/2008   EF [id number]; LV systolic function normal; LV mildly dilated; doppler flow suggestive of impaired LV relaxation; RV mildly dilated, RV systolic function normal, RV systolic pressure normal  . INCISION AND DRAINAGE ABSCESS N/A 05/23/2016   Procedure: DRAINAGE OF MULTIPLE INTRA  ABDOMINAL ABSCESS;  Surgeon: Jackolyn Confer, MD;  Location: [facility];  Service: General;  Laterality: N/A;  . LAPAROSCOPIC LYSIS OF ADHESIONS  11/10/2016   Procedure: LAPAROSCOPIC LYSIS OF ADHESIONS;  Surgeon: Jackolyn Confer, MD;  Location: [facility];  Service: General;;  . LAPAROTOMY N/A 05/23/2016   Procedure: EXPLORATORY LAPAROTOMY;  Surgeon: Jackolyn Confer, MD;  Location: [facility];  Service: General;  Laterality: N/A;  . PERCUTANEOUS CORONARY STENT INTERVENTION (PCI-S)  1992, 1999, 2004  4 vessels [name])  . PROCTOSCOPY  11/10/2016   Procedure: PROCTOSCOPY;  Surgeon: Jackolyn Confer, MD;  Location: [facility];  Service: General;;   Family History  Problem Relation Age of Onset  . Alzheimer's disease Mother   . Heart disease Father   . Heart attack Brother   . Diabetes Brother   . Heart disease Maternal Grandmother   . Cancer Maternal Grandfather   . Diabetes Paternal Grandfather    History  Sexual Activity  . Sexual activity:  Yes    Outpatient Encounter Prescriptions as of 12/06/2016  Medication Sig  . acetaminophen (TYLENOL) 500 MG tablet Take 1,000 mg by mouth every 6 (six) hours as needed for mild pain.  Marland Kitchen aspirin 81 MG tablet Take 81 mg by mouth daily.    Marland Kitchen atenolol (TENORMIN) 50 MG tablet TAKE ONE & ONE-HALF TABLETS BY MOUTH ONCE DAILY (Patient taking differently: TAKE ONE TABLET ONCE DAILY)  . atorvastatin (LIPITOR) 40 MG tablet TAKE ONE-HALF TABLET BY MOUTH ONCE DAILY  . Brinzolamide-Brimonidine (SIMBRINZA) 1-0.2 % SUSP Apply 1 drop to eye 3 (three) times daily.   . Cholecalciferol (VITAMIN D PO) Take 2,500 Units by mouth daily.  . clopidogrel (PLAVIX) 75 MG tablet TAKE ONE TABLET BY MOUTH ONCE DAILY  . Coenzyme Q10 200 MG capsule Take 200 mg by mouth daily.  . fenofibrate 160 MG tablet Take 1 tablet (160 mg total) by mouth daily.  Marland Kitchen glimepiride (AMARYL) 2 MG tablet TAKE ONE TABLET BY MOUTH ONCE DAILY WITH BREAKFAST  . ibuprofen (ADVIL,MOTRIN) 200 MG tablet Take 600 mg by mouth every 6 (six) hours as needed for mild pain.  Marland Kitchen latanoprost (XALATAN) 0.005 % ophthalmic solution Place 1 drop into both eyes at bedtime.   Marland Kitchen lisinopril (PRINIVIL,ZESTRIL) 2.5 MG tablet Take 1 tablet (2.5 mg total) by mouth daily.  . metFORMIN (GLUCOPHAGE) 1000 MG tablet Take 1 tablet (1,000 mg total) by mouth 2 (two) times daily with a meal.  . omega-3 acid ethyl esters (LOVAZA) 1 g capsule TAKE TWO CAPSULES BY MOUTH TWICE DAILY  . oxyCODONE (OXY IR/ROXICODONE) 5 MG immediate release tablet Take 1-2 tablets (5-10 mg total) by mouth every 4 (four) hours as needed for moderate pain.  Marland Kitchen senna-docusate (SENOKOT-S) 8.6-50 MG tablet Take 1 tablet by mouth at bedtime as needed for mild constipation.   No facility-administered encounter medications on file as of 12/06/2016.     Activities of Daily Living In your present state of health, do you have any difficulty performing the following activities: 12/06/2016 11/10/2016  Hearing? N Y    Vision? Y N  Difficulty concentrating or making decisions? N N  Walking or climbing stairs? N N  Dressing or bathing? N N  Doing errands, shopping? N N  Preparing Food and eating ? N -  Using the Toilet? N -  In the past six months, have you accidently leaked urine? N -  Do you have problems with loss of bowel control? N -  Managing your Medications? N -  Managing your Finances? N -  Housekeeping or managing your Housekeeping? N -  Some recent data might be hidden    Patient Care Team: [name], MD as PCP - General (Family Medicine) Troy Sine, MD as Consulting Physician (Cardiology) Jackolyn Confer, MD as Consulting Physician (General Surgery)   Assessment:     Hearing Screening   125Hz  250Hz  500Hz  1000Hz  2000Hz  3000Hz  4000Hz  6000Hz  8000Hz   Right ear:   [id number]  Left ear:   40 40 40  0    Vision Screening Comments: Last vision exam in Dec 2017 @ [place]   Exercise Activities and Dietary recommendations Current Exercise Habits: Home exercise routine, Type of exercise: walking, Time (Minutes): 30, Frequency (Times/Week): 2, Weekly Exercise (Minutes/Week): 60, Intensity: Mild, Exercise limited by: Other - see comments (recent surgery)  Goals    . Increase physical activity          Starting 12/06/16, I will continue to walk at least 30 min 2 days per week.       Fall Risk Fall Risk  12/06/2016 06/21/2016 10/22/2015 10/07/2014 09/17/2013  Falls in the past year? No No No No No  Risk for fall due to : - (No Data) - - -  Risk for fall due to (comments): - None - - -   Depression Screen PHQ 2/9 Scores 12/06/2016 06/21/2016 10/22/2015 10/07/2014  PHQ - 2 Score 0 1 0 0    Cognitive Function MMSE - Mini Mental State Exam 12/06/2016  Orientation to time 5  Orientation to Place 5  Registration 3  Attention/ Calculation 0  Recall 3  Language- name 2 objects 0  Language- repeat 1  Language- follow 3 step command 3  Language- read & follow direction 0  Write  a sentence 0  Copy design 0  Total score 20     PLEASE NOTE: A Mini-Cog screen was completed. Maximum score is 20. A value of 0 denotes this part of Folstein MMSE was not completed or the patient failed this part of the Mini-Cog screening.   Mini-Cog Screening Orientation to Time - Max 5 pts Orientation to Place - Max 5 pts Registration - Max 3 pts Recall - Max 3 pts Language Repeat - Max 1 pts Language Follow 3 Step Command - Max 3 pts     Immunization History  Administered Date(s) Administered  . DTaP 10/23/2012  . Influenza Split 08/02/2012  . Influenza,inj,Quad PF,36+ Mos 09/17/2013, 09/07/2014, 09/06/2016  . Influenza-Unspecified 09/28/2015  . Pneumococcal Conjugate-13 10/23/2013  . Pneumococcal-Unspecified 10/23/2012  . Td 10/23/2000   Screening Tests Health Maintenance  Topic Date Due  . TETANUS/TDAP  10/23/2022 (Originally 10/23/2010)  . HEMOGLOBIN A1C  05/07/2017  . OPHTHALMOLOGY EXAM  07/23/2017  . FOOT EXAM  10/23/2017  . COLONOSCOPY  09/13/2018  . DTaP/Tdap/Td (2 - Tdap) 10/23/2022  . INFLUENZA VACCINE  Completed  . Hepatitis C Screening  Completed  . PNA vac Low Risk Adult  Completed      Plan:     I have personally reviewed and addressed the Medicare Annual Wellness questionnaire and have noted the following in the patient's chart:  A. Medical and social history B. Use of alcohol, tobacco or illicit drugs  C. Current medications and supplements D. Functional ability and status E.  Nutritional status F.  Physical activity G. Advance directives H. List of other physicians I.  Hospitalizations, surgeries, and ER visits in previous 12 months J.  [place] to include hearing, vision, cognitive, depression L. Referrals and appointments - none  In addition, I have reviewed and discussed with patient certain preventive protocols, quality metrics, and best practice recommendations. A written personalized care plan for preventive services as well  as general preventive health recommendations were provided to patient.  See attached scanned questionnaire for additional information.   Signed,   Lindell Noe, MHA, BS, LPN Health Coach

## 2016-12-07 ENCOUNTER — Ambulatory Visit (INDEPENDENT_AMBULATORY_CARE_PROVIDER_SITE_OTHER): Payer: PPO | Admitting: Family Medicine

## 2016-12-07 ENCOUNTER — Encounter: Payer: Self-pay | Admitting: Family Medicine

## 2016-12-07 VITALS — BP 140/68 | HR 56 | Temp 97.9°F | Wt 247.0 lb

## 2016-12-07 DIAGNOSIS — Z0000 Encounter for general adult medical examination without abnormal findings: Secondary | ICD-10-CM

## 2016-12-07 DIAGNOSIS — IMO0001 Reserved for inherently not codable concepts without codable children: Secondary | ICD-10-CM

## 2016-12-07 DIAGNOSIS — Z9989 Dependence on other enabling machines and devices: Secondary | ICD-10-CM

## 2016-12-07 DIAGNOSIS — I2510 Atherosclerotic heart disease of native coronary artery without angina pectoris: Secondary | ICD-10-CM

## 2016-12-07 DIAGNOSIS — K5720 Diverticulitis of large intestine with perforation and abscess without bleeding: Secondary | ICD-10-CM

## 2016-12-07 DIAGNOSIS — E669 Obesity, unspecified: Secondary | ICD-10-CM

## 2016-12-07 DIAGNOSIS — G4733 Obstructive sleep apnea (adult) (pediatric): Secondary | ICD-10-CM | POA: Diagnosis not present

## 2016-12-07 DIAGNOSIS — Z87891 Personal history of nicotine dependence: Secondary | ICD-10-CM

## 2016-12-07 DIAGNOSIS — Z933 Colostomy status: Secondary | ICD-10-CM

## 2016-12-07 DIAGNOSIS — K651 Peritoneal abscess: Secondary | ICD-10-CM

## 2016-12-07 DIAGNOSIS — E1165 Type 2 diabetes mellitus with hyperglycemia: Secondary | ICD-10-CM

## 2016-12-07 DIAGNOSIS — Z7189 Other specified counseling: Secondary | ICD-10-CM

## 2016-12-07 DIAGNOSIS — I10 Essential (primary) hypertension: Secondary | ICD-10-CM

## 2016-12-07 DIAGNOSIS — E785 Hyperlipidemia, unspecified: Secondary | ICD-10-CM

## 2016-12-07 DIAGNOSIS — H409 Unspecified glaucoma: Secondary | ICD-10-CM

## 2016-12-07 DIAGNOSIS — M79672 Pain in left foot: Secondary | ICD-10-CM

## 2016-12-07 DIAGNOSIS — M79671 Pain in right foot: Secondary | ICD-10-CM

## 2016-12-07 DIAGNOSIS — IMO0002 Reserved for concepts with insufficient information to code with codable children: Secondary | ICD-10-CM

## 2016-12-07 DIAGNOSIS — E118 Type 2 diabetes mellitus with unspecified complications: Secondary | ICD-10-CM

## 2016-12-07 LAB — HEPATITIS C ANTIBODY: HCV Ab: NEGATIVE

## 2016-12-07 NOTE — Assessment & Plan Note (Signed)
Chronic, stable. Continue current regimen. RTC 6 mo f/u visit.

## 2016-12-07 NOTE — Assessment & Plan Note (Signed)
S/p colostomy takedown last month ([place]) and recovering well.

## 2016-12-07 NOTE — Progress Notes (Signed)
Pre visit review using our clinic review tool, if applicable. No additional management support is needed unless otherwise documented below in the visit note. 

## 2016-12-07 NOTE — Assessment & Plan Note (Signed)
Asxs. Continue aspirin, statin.  

## 2016-12-07 NOTE — Assessment & Plan Note (Signed)
Preventative protocols reviewed and updated unless pt declined. Discussed healthy diet and lifestyle.  

## 2016-12-07 NOTE — Assessment & Plan Note (Signed)
Chronic, stable.continue current regimen. Doing better on lower atenolol dose. Decreased due to noted bradycardia at home.

## 2016-12-07 NOTE — Assessment & Plan Note (Signed)
Fully recovered from this.

## 2016-12-07 NOTE — Assessment & Plan Note (Signed)
Colostomy wound is healing well after reversal.

## 2016-12-07 NOTE — Assessment & Plan Note (Signed)
Chronic, stable. Continue fibrate, lipitor, fish oil.

## 2016-12-07 NOTE — Assessment & Plan Note (Signed)
Reports compliance with CPAP machine - continue.

## 2016-12-07 NOTE — Patient Instructions (Addendum)
We will refer you for lung cancer screening CT.  Advanced directive packet provided today.  Try to get copy of records from [place] on foot care - we will fill out another handicap placard.  Good to see you today, call us with questions. I'm glad you're recovering so well from surgery.  Return in 6 months for diabetes follow up visit.   Health Maintenance, Male A healthy lifestyle and preventative care can promote health and wellness.  Maintain regular health, dental, and eye exams.  Eat a healthy diet. Foods like vegetables, fruits, whole grains, low-fat dairy products, and lean protein foods contain the nutrients you need and are low in calories. Decrease your intake of foods high in solid fats, added sugars, and salt. Get information about a proper diet from your health care provider, if necessary.  Regular physical exercise is one of the most important things you can do for your health. Most adults should get at least 150 minutes of moderate-intensity exercise (any activity that increases your heart rate and causes you to sweat) each week. In addition, most adults need muscle-strengthening exercises on 2 or more days a week.   Maintain a healthy weight. The body mass index (BMI) is a screening tool to identify possible weight problems. It provides an estimate of body fat based on height and weight. Your health care provider can find your BMI and can help you achieve or maintain a healthy weight. For males 20 years and older:  A BMI below 18.5 is considered underweight.  A BMI of 18.5 to 24.9 is normal.  A BMI of 25 to 29.9 is considered overweight.  A BMI of 30 and above is considered obese.  Maintain normal blood lipids and cholesterol by exercising and minimizing your intake of saturated fat. Eat a balanced diet with plenty of fruits and vegetables. Blood tests for lipids and cholesterol should begin at age 54 and be repeated every 5 years. If your lipid or cholesterol levels are high,  you are over age 5, or you are at high risk for heart disease, you may need your cholesterol levels checked more frequently.Ongoing high lipid and cholesterol levels should be treated with medicines if diet and exercise are not working.  If you smoke, find out from your health care provider how to quit. If you do not use tobacco, do not start.  Lung cancer screening is recommended for adults aged 101-80 years who are at high risk for developing lung cancer because of a history of smoking. A yearly low-dose CT scan of the lungs is recommended for people who have at least a 30-pack-year history of smoking and are current smokers or have quit within the past 15 years. A pack year of smoking is smoking an average of 1 pack of cigarettes a day for 1 year (for example, a 30-pack-year history of smoking could mean smoking 1 pack a day for 30 years or 2 packs a day for 15 years). Yearly screening should continue until the smoker has stopped smoking for at least 15 years. Yearly screening should be stopped for people who develop a health problem that would prevent them from having lung cancer treatment.  If you choose to drink alcohol, do not have more than 2 drinks per day. One drink is considered to be 12 oz (360 mL) of beer, 5 oz (150 mL) of wine, or 1.5 oz (45 mL) of liquor.  Avoid the use of street drugs. Do not share needles with anyone. Ask for  help if you need support or instructions about stopping the use of drugs.  High blood pressure causes heart disease and increases the risk of stroke. High blood pressure is more likely to develop in:  People who have blood pressure in the end of the normal range (100-139/85-89 mm Hg).  People who are overweight or obese.  People who are African American.  If you are 68-5 years of age, have your blood pressure checked every 3-5 years. If you are 31 years of age or older, have your blood pressure checked every year. You should have your blood pressure measured  twice-once when you are at a hospital or clinic, and once when you are not at a hospital or clinic. Record the average of the two measurements. To check your blood pressure when you are not at a hospital or clinic, you can use:  An automated blood pressure machine at a pharmacy.  A home blood pressure monitor.  If you are 17-39 years old, ask your health care provider if you should take aspirin to prevent heart disease.  Diabetes screening involves taking a blood sample to check your fasting blood sugar level. This should be done once every 3 years after age 33 if you are at a normal weight and without risk factors for diabetes. Testing should be considered at a younger age or be carried out more frequently if you are overweight and have at least 1 risk factor for diabetes.  Colorectal cancer can be detected and often prevented. Most routine colorectal cancer screening begins at the age of 38 and continues through age 26. However, your health care provider may recommend screening at an earlier age if you have risk factors for colon cancer. On a yearly basis, your health care provider may provide home test kits to check for hidden blood in the stool. A small camera at the end of a tube may be used to directly examine the colon (sigmoidoscopy or colonoscopy) to detect the earliest forms of colorectal cancer. Talk to your health care provider about this at age 1 when routine screening begins. A direct exam of the colon should be repeated every 5-10 years through age 38, unless early forms of precancerous polyps or small growths are found.  People who are at an increased risk for hepatitis B should be screened for this virus. You are considered at high risk for hepatitis B if:  You were born in a country where hepatitis B occurs often. Talk with your health care provider about which countries are considered high risk.  Your parents were born in a high-risk country and you have not received a shot to  protect against hepatitis B (hepatitis B vaccine).  You have HIV or AIDS.  You use needles to inject street drugs.  You live with, or have sex with, someone who has hepatitis B.  You are a man who has sex with other men (MSM).  You get hemodialysis treatment.  You take certain medicines for conditions like cancer, organ transplantation, and autoimmune conditions.  Hepatitis C blood testing is recommended for all people born from [birth year] through 1966 and any individual with known risk factors for hepatitis C.  Healthy men should no longer receive prostate-specific antigen (PSA) blood tests as part of routine cancer screening. Talk to your health care provider about prostate cancer screening.  Testicular cancer screening is not recommended for adolescents or adult males who have no symptoms. Screening includes self-exam, a health care provider exam, and other screening  tests. Consult with your health care provider about any symptoms you have or any concerns you have about testicular cancer.  Practice safe sex. Use condoms and avoid high-risk sexual practices to reduce the spread of sexually transmitted infections (STIs).  You should be screened for STIs, including gonorrhea and chlamydia if:  You are sexually active and are younger than 24 years.  You are older than 24 years, and your health care provider tells you that you are at risk for this type of infection.  Your sexual activity has changed since you were last screened, and you are at an increased risk for chlamydia or gonorrhea. Ask your health care provider if you are at risk.  If you are at risk of being infected with HIV, it is recommended that you take a prescription medicine daily to prevent HIV infection. This is called pre-exposure prophylaxis (PrEP). You are considered at risk if:  You are a man who has sex with other men (MSM).  You are a heterosexual man who is sexually active with multiple partners.  You take drugs by  injection.  You are sexually active with a partner who has HIV.  Talk with your health care provider about whether you are at high risk of being infected with HIV. If you choose to begin PrEP, you should first be tested for HIV. You should then be tested every 3 months for as long as you are taking PrEP.  Use sunscreen. Apply sunscreen liberally and repeatedly throughout the day. You should seek shade when your shadow is shorter than you. Protect yourself by wearing long sleeves, pants, a wide-brimmed hat, and sunglasses year round whenever you are outdoors.  Tell your health care provider of new moles or changes in moles, especially if there is a change in shape or color. Also, tell your health care provider if a mole is larger than the size of a pencil eraser.  A one-time screening for abdominal aortic aneurysm (AAA) and surgical repair of large AAAs by ultrasound is recommended for men aged 45-75 years who are current or former smokers.  Stay current with your vaccines (immunizations). This information is not intended to replace advice given to you by your health care provider. Make sure you discuss any questions you have with your health care provider. Document Released: 04/06/2008 Document Revised: 10/30/2014 Document Reviewed: 07/13/2015 Elsevier Interactive Patient Education  2017 [employer].

## 2016-12-07 NOTE — Assessment & Plan Note (Signed)
Discussed healthy diet changes to affect sustainable weight loss. 

## 2016-12-07 NOTE — Progress Notes (Signed)
BP 140/68   Pulse (!) 56   Temp 97.9 F (36.6 C) (Oral)   Wt 247 lb (112 kg)   BMI 37.56 kg/m    CC: CPE Subjective:    Patient ID: Evan Avery, male    DOB: 01-14-1946, 71 y.o.   MRN: [id number]  HPI: Evan Avery is a 71 y.o. male presenting on 12/07/2016 for Annual Exam   Saw Katha Cabal yesterday for medicare wellness visit. Note reviewed.  Sees Dr Francia Greaves at [facility].  Last year suffered perforated diverticulitis - recovering after recent laparoscopic colostomy reversal.  Ex smoker  H/o chronic plantar fasciitis over last 1.5 yrs followed by [place] - he uses orthotics regularly for this. Requests handicap placard for this. Pt declined cortisone shot or foot surgery.   Preventative: COLONOSCOPY 08/2016 through colostomy - TAx5, sessile serrated polyp, diverticulosis, rpt 2 yrs (Outlaw)  Prostate cancer screening - discussed, may screen Q 2-3 yrs. Deferred today Lung cancer screening - interested.  Flu shot yearly at [facility] Tdap 2014  Pneumovax 2014, prevnar 2015 Shingles shot - declines. Never had chicken pox.  Advanced directive discussion - does not want prolonged life support. Would want wife to be HCPOA. Packet provided today.  Seat belt use discussed Sunscreen use discussed, no changing moles on skin.  Ex smoker - quit 04/2016 Alcohol - rare  Lives with wife and son (with mental issues) Occupation: retired, was Multimedia programmer for [employer] Activity: no regular exercise Diet: good water, fruits/vegetables daily  Relevant past medical, surgical, family and social history reviewed and updated as indicated. Interim medical history since our last visit reviewed. Allergies and medications reviewed and updated. Current Outpatient Prescriptions on File Prior to Visit  Medication Sig  . acetaminophen (TYLENOL) 500 MG tablet Take 1,000 mg by mouth every 6 (six) hours as needed for mild pain.  Marland Kitchen aspirin 81 MG tablet Take 81 mg by mouth daily.    Marland Kitchen atorvastatin  (LIPITOR) 40 MG tablet TAKE ONE-HALF TABLET BY MOUTH ONCE DAILY  . Brinzolamide-Brimonidine (SIMBRINZA) 1-0.2 % SUSP Apply 1 drop to eye 3 (three) times daily.   . Cholecalciferol (VITAMIN D PO) Take 2,500 Units by mouth daily.  . clopidogrel (PLAVIX) 75 MG tablet TAKE ONE TABLET BY MOUTH ONCE DAILY  . Coenzyme Q10 200 MG capsule Take 200 mg by mouth daily.  . fenofibrate 160 MG tablet Take 1 tablet (160 mg total) by mouth daily.  Marland Kitchen glimepiride (AMARYL) 2 MG tablet TAKE ONE TABLET BY MOUTH ONCE DAILY WITH BREAKFAST  . ibuprofen (ADVIL,MOTRIN) 200 MG tablet Take 600 mg by mouth every 6 (six) hours as needed for mild pain.  Marland Kitchen latanoprost (XALATAN) 0.005 % ophthalmic solution Place 1 drop into both eyes at bedtime.   Marland Kitchen lisinopril (PRINIVIL,ZESTRIL) 2.5 MG tablet Take 1 tablet (2.5 mg total) by mouth daily.  . metFORMIN (GLUCOPHAGE) 1000 MG tablet Take 1 tablet (1,000 mg total) by mouth 2 (two) times daily with a meal.  . omega-3 acid ethyl esters (LOVAZA) 1 g capsule TAKE TWO CAPSULES BY MOUTH TWICE DAILY  . senna-docusate (SENOKOT-S) 8.6-50 MG tablet Take 1 tablet by mouth at bedtime as needed for mild constipation.   No current facility-administered medications on file prior to visit.     Review of Systems  Constitutional: Negative for activity change, appetite change, chills, fatigue, fever and unexpected weight change.  HENT: Negative for hearing loss.   Eyes: Negative for visual disturbance.  Respiratory: Negative for cough, chest tightness, shortness  of breath and wheezing.   Cardiovascular: Negative for chest pain, palpitations and leg swelling.  Gastrointestinal: Negative for abdominal distention, abdominal pain, blood in stool, constipation, diarrhea, nausea and vomiting.  Genitourinary: Negative for difficulty urinating and hematuria.  Musculoskeletal: Negative for arthralgias, myalgias and neck pain.  Skin: Negative for rash.  Neurological: Negative for dizziness, seizures, syncope  and headaches.  Hematological: Negative for adenopathy. Does not bruise/bleed easily.  Psychiatric/Behavioral: Negative for dysphoric mood. The patient is not nervous/anxious.    Per HPI unless specifically indicated in ROS section     Objective:    BP 140/68   Pulse (!) 56   Temp 97.9 F (36.6 C) (Oral)   Wt 247 lb (112 kg)   BMI 37.56 kg/m   Wt Readings from Last 3 Encounters:  12/07/16 247 lb (112 kg)  12/06/16 247 lb 4 oz (112.2 kg)  11/10/16 247 lb (112 kg)    Physical Exam  Constitutional: He is oriented to person, place, and time. He appears well-developed and well-nourished. No distress.  HENT:  Head: Normocephalic and atraumatic.  Right Ear: Hearing, tympanic membrane, external ear and ear canal normal.  Left Ear: Hearing, tympanic membrane, external ear and ear canal normal.  Nose: Nose normal.  Mouth/Throat: Uvula is midline, oropharynx is clear and moist and mucous membranes are normal. No oropharyngeal exudate, posterior oropharyngeal edema or posterior oropharyngeal erythema.  Eyes: Conjunctivae and EOM are normal. Pupils are equal, round, and reactive to light. No scleral icterus.  Neck: Normal range of motion. Neck supple. Carotid bruit is not present. No thyromegaly present.  Cardiovascular: Normal rate, regular rhythm, normal heart sounds and intact distal pulses.   No murmur heard. Pulses:      Radial pulses are 2+ on the right side, and 2+ on the left side.  Pulmonary/Chest: Effort normal and breath sounds normal. No respiratory distress. He has no wheezes. He has no rales.  Abdominal: Soft. Bowel sounds are normal. He exhibits no distension and no mass. There is no tenderness. There is no rebound and no guarding.  Genitourinary: Rectum normal and prostate normal. Rectal exam shows no external hemorrhoid, no internal hemorrhoid, no fissure, no mass, no tenderness and anal tone normal. Prostate is not enlarged and not tender.  Musculoskeletal: Normal range of  motion. He exhibits no edema.  Lymphadenopathy:    He has no cervical adenopathy.  Neurological: He is alert and oriented to person, place, and time.  CN grossly intact, station and gait intact  Skin: Skin is warm and dry. No rash noted.  Psychiatric: He has a normal mood and affect. His behavior is normal. Judgment and thought content normal.  Nursing note and vitals reviewed.  Results for orders placed or performed in visit on 12/06/16  Lipid panel  Result Value Ref Range   Cholesterol 129 0 - 200 mg/dL   Triglycerides 120.0 0.0 - 149.0 mg/dL   HDL 35.30 (L) >39.00 mg/dL   VLDL 24.0 0.0 - 40.0 mg/dL   LDL Cholesterol 69 0 - 99 mg/dL   Total CHOL/HDL Ratio 4    NonHDL 93.41   Comprehensive metabolic panel  Result Value Ref Range   Sodium 142 135 - 145 mEq/L   Potassium 4.5 3.5 - 5.1 mEq/L   Chloride 107 96 - 112 mEq/L   CO2 30 19 - 32 mEq/L   Glucose, Bld 175 (H) 70 - 99 mg/dL   BUN 17 6 - 23 mg/dL   Creatinine, Ser 1.11 0.40 -  1.50 mg/dL   Total Bilirubin 0.4 0.2 - 1.2 mg/dL   Alkaline Phosphatase 43 39 - 117 U/L   AST 16 0 - 37 U/L   ALT 22 0 - 53 U/L   Total Protein 6.6 6.0 - 8.3 g/dL   Albumin 4.4 3.5 - 5.2 g/dL   Calcium 9.7 8.4 - 10.5 mg/dL   GFR 69.34 >60.00 mL/min  PSA, Medicare  Result Value Ref Range   PSA 0.54 0.10 - 4.00 ng/ml  Hepatitis C antibody  Result Value Ref Range   HCV Ab NEGATIVE NEGATIVE   Lab Results  Component Value Date   HGBA1C 7.0 (H) 11/07/2016       Assessment & Plan:   Problem List Items Addressed This Visit    Advanced care planning/counseling discussion    Advanced directive discussion - does not want prolonged life support. Would want wife to be HCPOA. Packet provided today.       Bilateral foot pain    Handicap placard filled out. Pt endorses chronic history of plantar fasciitis, states this is followed by [facility], has received xrays and foot orthotics. Discussed if desires permanent handicap placard I would like records from [place]  to review. ROI signed today.       CAD in native artery    Asxs. Continue aspirin, statin.       Relevant Medications   atenolol (TENORMIN) 50 MG tablet   Colostomy in place [facility])    Colostomy wound is healing well after reversal.       Diabetes mellitus type 2, uncontrolled, with complications (HCC)    Chronic, stable. Continue current regimen. RTC 6 mo f/u visit.       RESOLVED: Diverticulitis of colon with perforation    S/p colostomy takedown last month ([place]) and recovering well.       Essential hypertension    Chronic, stable.continue current regimen. Doing better on lower atenolol dose. Decreased due to noted bradycardia at home.       Relevant Medications   atenolol (TENORMIN) 50 MG tablet   Ex-smoker    Congratulated on abstinence. Discussed lung cancer screening CT - will refer for this.       Relevant Orders   Ambulatory Referral for Lung Cancer Scre   Glaucoma    [facility]       Health maintenance examination - Primary    Preventative protocols reviewed and updated unless pt declined. Discussed healthy diet and lifestyle.       Hyperlipidemia with target LDL less than 70    Chronic, stable. Continue fibrate, lipitor, fish oil.       Relevant Medications   atenolol (TENORMIN) 50 MG tablet   RESOLVED: Intra-abdominal abscess (HCC)    Fully recovered from this.       Obesity, Class II, BMI 35-39.9, with comorbidity    Discussed healthy diet changes to affect sustainable weight loss.       OSA on CPAP    Reports compliance with CPAP machine - continue.           Follow up plan: Return in about 6 months (around 06/06/2017) for follow up visit.  [name], MD

## 2016-12-07 NOTE — Assessment & Plan Note (Signed)
[facility].  

## 2016-12-07 NOTE — Assessment & Plan Note (Signed)
Congratulated on abstinence. Discussed lung cancer screening CT - will refer for this.

## 2016-12-07 NOTE — Assessment & Plan Note (Signed)
Handicap placard filled out. Pt endorses chronic history of plantar fasciitis, states this is followed by [facility], has received xrays and foot orthotics. Discussed if desires permanent handicap placard I would like records from [place] to review. ROI signed today.

## 2016-12-07 NOTE — Assessment & Plan Note (Signed)
Advanced directive discussion - does not want prolonged life support. Would want wife to be HCPOA. Packet provided today.

## 2016-12-15 ENCOUNTER — Telehealth: Payer: Self-pay | Admitting: Family Medicine

## 2016-12-15 NOTE — Telephone Encounter (Signed)
Rec'd from [facility] forward 20 pages to Dr. [name]

## 2016-12-19 ENCOUNTER — Telehealth: Payer: Self-pay | Admitting: *Deleted

## 2016-12-19 NOTE — Telephone Encounter (Signed)
Received referral for low dose lung cancer screening CT scan. Voicemail left at phone number listed in EMR for patient to call me back to facilitate scheduling scan.  

## 2017-01-07 ENCOUNTER — Encounter: Payer: Self-pay | Admitting: Family Medicine

## 2017-01-15 ENCOUNTER — Encounter: Payer: Self-pay | Admitting: *Deleted

## 2017-01-29 ENCOUNTER — Other Ambulatory Visit: Payer: Self-pay | Admitting: Family Medicine

## 2017-02-02 ENCOUNTER — Telehealth: Payer: Self-pay | Admitting: Cardiovascular Disease

## 2017-02-02 NOTE — Telephone Encounter (Signed)
Returned the phone call to the patient. He has been informed that Dr. [name] will have it signed today. He verbalized his understanding.

## 2017-02-02 NOTE — Telephone Encounter (Signed)
New  Message   On Monday 4/10  Patient drop off dental clearance at registration checking on the status.

## 2017-02-06 ENCOUNTER — Telehealth: Payer: Self-pay | Admitting: *Deleted

## 2017-02-06 NOTE — Telephone Encounter (Signed)
Faxed dental approval for patient to have dental extractions.

## 2017-02-14 ENCOUNTER — Other Ambulatory Visit: Payer: Self-pay | Admitting: Family Medicine

## 2017-03-06 ENCOUNTER — Ambulatory Visit (INDEPENDENT_AMBULATORY_CARE_PROVIDER_SITE_OTHER): Payer: PPO | Admitting: Family Medicine

## 2017-03-06 ENCOUNTER — Encounter: Payer: Self-pay | Admitting: Family Medicine

## 2017-03-06 VITALS — BP 126/72 | HR 70 | Temp 97.8°F | Wt 255.8 lb

## 2017-03-06 DIAGNOSIS — L720 Epidermal cyst: Secondary | ICD-10-CM | POA: Diagnosis not present

## 2017-03-06 MED ORDER — SULFAMETHOXAZOLE-TRIMETHOPRIM 800-160 MG PO TABS: 1.0000 | ORAL_TABLET | Freq: Two times a day (BID) | ORAL | 0 refills | Status: DC

## 2017-03-06 NOTE — Assessment & Plan Note (Signed)
Infected cyst of neck s/p I&D in office today. Pt tolerated well. Aftercare instructions provided. RTC 2-3d wound check.

## 2017-03-06 NOTE — Patient Instructions (Addendum)
I&D performed today. Take bactrim antibiotic prescribed today.  Take small amt of packing out each day Return in 2-3 days for wound check.   Incision and Drainage Incision and drainage is a surgical procedure to open and drain a fluid-filled sac. The sac may be filled with pus, mucus, or blood. Examples of fluid-filled sacs that may need surgical drainage include cysts, skin infections (abscesses), and red lumps that develop from a ruptured cyst or a small abscess (boils). You may need this procedure if the affected area is large, painful, infected, or not healing well. Tell a health care provider about:  Any allergies you have.  All medicines you are taking, including vitamins, herbs, eye drops, creams, and over-the-counter medicines.  Any problems you or family members have had with anesthetic medicines.  Any blood disorders you have.  Any surgeries you have had.  Any medical conditions you have.  Whether you are pregnant or may be pregnant. What are the risks? Generally, this is a safe procedure. However, problems may occur, including:  Infection.  Bleeding.  Allergic reactions to medicines.  Scarring. What happens before the procedure?  You may need an ultrasound or other imaging tests to see how large or deep the fluid-filled sac is.  You may have blood tests to check for infection.  You may get a tetanus shot.  You may be given antibiotic medicine to help prevent infection.  Follow instructions from your health care provider about eating or drinking restrictions.  Ask your health care provider about:  Changing or stopping your regular medicines. This is especially important if you are taking diabetes medicines or blood thinners.  Taking medicines such as aspirin and ibuprofen. These medicines can thin your blood. Do not take these medicines before your procedure if your health care provider instructs you not to.  Plan to have someone take you home after the  procedure.  If you will be going home right after the procedure, plan to have someone stay with you for 24 hours. What happens during the procedure?  To reduce your risk of infection:  Your health care team will wash or sanitize their hands.  Your skin will be washed with soap.  You will be given one or more of the following:  A medicine to help you relax (sedative).  A medicine to numb the area (local anesthetic).  A medicine to make you fall asleep (general anesthetic).  An incision will be made in the top of the fluid-filled sac.  The contents of the sac may be squeezed out, or a syringe or tube (catheter)may be used to empty the sac.  The catheter may be left in place for several weeks to drain any fluid. Or, your health care provider may stitch open the edges of the incision to make a long-term opening for drainage (marsupialization).  The inside of the sac may be washed out (irrigated) with a sterile solution and packed with gauze before it is covered with a bandage (dressing). The procedure may vary among health care providers and hospitals. What happens after the procedure?  Your blood pressure, heart rate, breathing rate, and blood oxygen level will be monitored often until the medicines you were given have worn off.  Do not drive for 24 hours if you received a sedative. This information is not intended to replace advice given to you by your health care provider. Make sure you discuss any questions you have with your health care provider. Document Released: 04/04/2001 Document Revised: 03/16/2016  Document Reviewed: 07/30/2015 Elsevier Interactive Patient Education  2017 [employer].

## 2017-03-06 NOTE — Progress Notes (Signed)
BP 126/72 (BP Location: Left Arm, Patient Position: Sitting, Cuff Size: Large)   Pulse 70   Temp 97.8 F (36.6 C) (Oral)   Wt 255 lb 12 oz (116 kg)   SpO2 95%   BMI 38.89 kg/m    CC: cyst on neck "making me miserable" Subjective:    Patient ID: Evan Avery, male    DOB: 1946-09-16, 71 y.o.   MRN: [id number]  HPI: Evan Avery is a 71 y.o. male presenting on 03/06/2017 for ? cyst on back of neck   Longstanding h/o cyst on back of neck. 10 days ago started becoming more painful and red, he got wife to open cyst, it has been draining for last several days. Has been using vinegar.   No fevers/chills, neck stiffness, nausea.  Taking tylenol.   He did stop plavix 4d ago.  Relevant past medical, surgical, family and social history reviewed and updated as indicated. Interim medical history since our last visit reviewed. Allergies and medications reviewed and updated. Outpatient Medications Prior to Visit  Medication Sig Dispense Refill  . acetaminophen (TYLENOL) 500 MG tablet Take 1,000 mg by mouth every 6 (six) hours as needed for mild pain.    Marland Kitchen aspirin 81 MG tablet Take 81 mg by mouth daily.      Marland Kitchen atenolol (TENORMIN) 50 MG tablet Take 1 tablet (50 mg total) by mouth daily.    Marland Kitchen atorvastatin (LIPITOR) 40 MG tablet TAKE ONE-HALF TABLET BY MOUTH ONCE DAILY 45 tablet 1  . Brinzolamide-Brimonidine (SIMBRINZA) 1-0.2 % SUSP Apply 1 drop to eye 3 (three) times daily.     . Cholecalciferol (VITAMIN D PO) Take 2,500 Units by mouth daily.    . clopidogrel (PLAVIX) 75 MG tablet TAKE ONE TABLET BY MOUTH ONCE DAILY 90 tablet 0  . Coenzyme Q10 200 MG capsule Take 200 mg by mouth daily.    . fenofibrate 160 MG tablet Take 1 tablet (160 mg total) by mouth daily. 30 tablet 12  . glimepiride (AMARYL) 2 MG tablet TAKE ONE TABLET BY MOUTH ONCE DAILY WITH BREAKFAST 90 tablet 2  . ibuprofen (ADVIL,MOTRIN) 200 MG tablet Take 600 mg by mouth every 6 (six) hours as needed for mild pain.    Marland Kitchen  latanoprost (XALATAN) 0.005 % ophthalmic solution Place 1 drop into both eyes at bedtime.     Marland Kitchen lisinopril (PRINIVIL,ZESTRIL) 2.5 MG tablet Take 1 tablet (2.5 mg total) by mouth daily. 90 tablet 2  . metFORMIN (GLUCOPHAGE) 1000 MG tablet Take 1 tablet (1,000 mg total) by mouth 2 (two) times daily with a meal. 180 tablet 1  . omega-3 acid ethyl esters (LOVAZA) 1 g capsule TAKE TWO CAPSULES BY MOUTH TWICE DAILY 360 capsule 2  . senna-docusate (SENOKOT-S) 8.6-50 MG tablet Take 1 tablet by mouth at bedtime as needed for mild constipation. 15 tablet 0  . metFORMIN (GLUCOPHAGE) 1000 MG tablet TAKE ONE TABLET BY MOUTH TWICE DAILY WITH MEALS 180 tablet 2   No facility-administered medications prior to visit.      Per HPI unless specifically indicated in ROS section below Review of Systems     Objective:    BP 126/72 (BP Location: Left Arm, Patient Position: Sitting, Cuff Size: Large)   Pulse 70   Temp 97.8 F (36.6 C) (Oral)   Wt 255 lb 12 oz (116 kg)   SpO2 95%   BMI 38.89 kg/m   Wt Readings from Last 3 Encounters:  03/06/17 255 lb 12 oz (116  kg)  12/07/16 247 lb (112 kg)  12/06/16 247 lb 4 oz (112.2 kg)    Physical Exam  Constitutional: He appears well-developed and well-nourished. No distress.  Skin: Skin is warm and dry. No rash noted.  Tender cyst on back of neck with some induration and mild fluctuance Induration 5cm diameter  Nursing note and vitals reviewed.  Lab Results  Component Value Date   CREATININE 1.11 12/06/2016    I&D neck cyst Meds, vitals, and allergies reviewed.  Indication: suspect abscess Pt complaints of: erythema, pain, swelling Location: nape of neck Size: 5cm diameter Verbal informed consent obtained.  Pt aware of risks not limited to but including infection, bleeding, damage to near by organs. Prep: betadine Anesthesia: 1%lidocaine with epi, good effect Incision made with #11 blade Would explored and loculations removed Wound packed with iodoform  gauze Tolerated well Routine postprocedure instructions d/w pt- remove packing in 24-48h, keep area clean and bandaged, follow up if concerns/spreading erythema/pain. RTC 2-3d f/u visit.      Assessment & Plan:   Problem List Items Addressed This Visit    Epidermal cyst of neck - Primary    Infected cyst of neck s/p I&D in office today. Pt tolerated well. Aftercare instructions provided. RTC 2-3d wound check.           Follow up plan: Return if symptoms worsen or fail to improve.  [name], MD

## 2017-03-07 ENCOUNTER — Other Ambulatory Visit: Payer: Self-pay | Admitting: Family Medicine

## 2017-03-08 ENCOUNTER — Encounter: Payer: Self-pay | Admitting: Family Medicine

## 2017-03-08 ENCOUNTER — Ambulatory Visit (INDEPENDENT_AMBULATORY_CARE_PROVIDER_SITE_OTHER): Payer: PPO | Admitting: Family Medicine

## 2017-03-08 VITALS — BP 124/70 | HR 58 | Temp 97.6°F | Wt 255.0 lb

## 2017-03-08 DIAGNOSIS — L720 Epidermal cyst: Secondary | ICD-10-CM

## 2017-03-08 DIAGNOSIS — M1990 Unspecified osteoarthritis, unspecified site: Secondary | ICD-10-CM | POA: Insufficient documentation

## 2017-03-08 DIAGNOSIS — M150 Primary generalized (osteo)arthritis: Secondary | ICD-10-CM

## 2017-03-08 DIAGNOSIS — M159 Polyosteoarthritis, unspecified: Secondary | ICD-10-CM

## 2017-03-08 DIAGNOSIS — M79671 Pain in right foot: Secondary | ICD-10-CM | POA: Diagnosis not present

## 2017-03-08 DIAGNOSIS — M79672 Pain in left foot: Secondary | ICD-10-CM | POA: Diagnosis not present

## 2017-03-08 MED ORDER — TRAMADOL HCL 50 MG PO TABS: 50.0000 mg | ORAL_TABLET | Freq: Three times a day (TID) | ORAL | 0 refills | Status: DC | PRN

## 2017-03-08 NOTE — Assessment & Plan Note (Signed)
Chronic pain in multiple joints attributed to osteoarthritis he takes ibuprofen 600mg  bid currently with some improvement. Will Rx tramadol for breakthrough pain. Discussed possible habit forming nature of medication, encouraged to use sparingly.

## 2017-03-08 NOTE — Assessment & Plan Note (Signed)
Again requested [facility] records - he's received orthotics and xrays there. ROI previously signed - he will contact [facility].

## 2017-03-08 NOTE — Assessment & Plan Note (Signed)
Wound healing well. Dressing changed. Repacked with small amt packing.

## 2017-03-08 NOTE — Patient Instructions (Addendum)
Return Monday for wound check.  You are doing well today.  Remove packing in 2 days. Continue daily dressing changes.  Continue ibuprofen 600mg  twice daily. Try tramadol for breakthrough arthritis pain.

## 2017-03-08 NOTE — Progress Notes (Signed)
BP 124/70 (BP Location: Left Arm, Patient Position: Sitting, Cuff Size: Large)   Pulse (!) 58   Temp 97.6 F (36.4 C) (Oral)   Wt 255 lb (115.7 kg)   SpO2 94%   BMI 38.77 kg/m    CC: f/u visit  Subjective:    Patient ID: Evan Avery, male    DOB: 25-Jun-1946, 71 y.o.   MRN: [id number]  HPI: Evan Avery is a 71 y.o. male presenting on 03/08/2017 for Follow-up (Procedure for cyst removal done 03-06-17. Feels better. Says it is still draining.)   See prior note for details. 03/06/2017 had I&D of infected epidermal cyst on posterior neck.   Here for f/u. Feels better. Continues to drain.   Requests medication for arthritis pain. He has been taking ibuprofen 600mg  bid which helps.  Requests permanent handicap placard - advised I wanted records from [facility] for foot issues he's had and would discuss at that time. He will contact [facility].   Relevant past medical, surgical, family and social history reviewed and updated as indicated. Interim medical history since our last visit reviewed. Allergies and medications reviewed and updated. Outpatient Medications Prior to Visit  Medication Sig Dispense Refill  . acetaminophen (TYLENOL) 500 MG tablet Take 1,000 mg by mouth every 6 (six) hours as needed for mild pain.    Marland Kitchen aspirin 81 MG tablet Take 81 mg by mouth daily.      Marland Kitchen atenolol (TENORMIN) 50 MG tablet Take 1 tablet (50 mg total) by mouth daily.    Marland Kitchen atorvastatin (LIPITOR) 40 MG tablet TAKE ONE-HALF TABLET BY MOUTH ONCE DAILY 45 tablet 1  . Brinzolamide-Brimonidine (SIMBRINZA) 1-0.2 % SUSP Apply 1 drop to eye 3 (three) times daily.     . Cholecalciferol (VITAMIN D PO) Take 2,500 Units by mouth daily.    . clopidogrel (PLAVIX) 75 MG tablet TAKE ONE TABLET BY MOUTH ONCE DAILY 90 tablet 1  . Coenzyme Q10 200 MG capsule Take 200 mg by mouth daily.    . fenofibrate 160 MG tablet Take 1 tablet (160 mg total) by mouth daily. 30 tablet 12  . glimepiride (AMARYL) 2 MG  tablet TAKE ONE TABLET BY MOUTH ONCE DAILY WITH BREAKFAST 90 tablet 2  . ibuprofen (ADVIL,MOTRIN) 200 MG tablet Take 600 mg by mouth every 6 (six) hours as needed for mild pain.    Marland Kitchen latanoprost (XALATAN) 0.005 % ophthalmic solution Place 1 drop into both eyes at bedtime.     Marland Kitchen lisinopril (PRINIVIL,ZESTRIL) 2.5 MG tablet Take 1 tablet (2.5 mg total) by mouth daily. 90 tablet 2  . metFORMIN (GLUCOPHAGE) 1000 MG tablet Take 1 tablet (1,000 mg total) by mouth 2 (two) times daily with a meal. 180 tablet 1  . omega-3 acid ethyl esters (LOVAZA) 1 g capsule TAKE TWO CAPSULES BY MOUTH TWICE DAILY 360 capsule 2  . senna-docusate (SENOKOT-S) 8.6-50 MG tablet Take 1 tablet by mouth at bedtime as needed for mild constipation. 15 tablet 0  . sulfamethoxazole-trimethoprim (BACTRIM DS,SEPTRA DS) 800-160 MG tablet Take 1 tablet by mouth 2 (two) times daily. 10 tablet 0   No facility-administered medications prior to visit.      Per HPI unless specifically indicated in ROS section below Review of Systems     Objective:    BP 124/70 (BP Location: Left Arm, Patient Position: Sitting, Cuff Size: Large)   Pulse (!) 58   Temp 97.6 F (36.4 C) (Oral)   Wt 255 lb (115.7 kg)  SpO2 94%   BMI 38.77 kg/m   Wt Readings from Last 3 Encounters:  03/08/17 255 lb (115.7 kg)  03/06/17 255 lb 12 oz (116 kg)  12/07/16 247 lb (112 kg)    Physical Exam  Skin: Skin is warm and dry. No rash noted.  Wound posterior neck at site of epidermal cyst without surrounding erythema, packing changed.   Nursing note and vitals reviewed.     Assessment & Plan:   Problem List Items Addressed This Visit    Bilateral foot pain    Again requested [place] records - he's received orthotics and xrays there. ROI previously signed - he will contact [facility].       Epidermal cyst of neck - Primary    Wound healing well. Dressing changed. Repacked with small amt packing.       Osteoarthritis    Chronic pain in  multiple joints attributed to osteoarthritis he takes ibuprofen 600mg  bid currently with some improvement. Will Rx tramadol for breakthrough pain. Discussed possible habit forming nature of medication, encouraged to use sparingly.       Relevant Medications   traMADol (ULTRAM) 50 MG tablet       Follow up plan: Return if symptoms worsen or fail to improve.  [name], MD

## 2017-03-12 ENCOUNTER — Encounter: Payer: Self-pay | Admitting: Family Medicine

## 2017-03-12 ENCOUNTER — Ambulatory Visit (INDEPENDENT_AMBULATORY_CARE_PROVIDER_SITE_OTHER): Payer: PPO | Admitting: Family Medicine

## 2017-03-12 VITALS — BP 116/70 | HR 57 | Temp 98.1°F | Wt 251.0 lb

## 2017-03-12 DIAGNOSIS — L720 Epidermal cyst: Secondary | ICD-10-CM

## 2017-03-12 DIAGNOSIS — M150 Primary generalized (osteo)arthritis: Secondary | ICD-10-CM

## 2017-03-12 DIAGNOSIS — M159 Polyosteoarthritis, unspecified: Secondary | ICD-10-CM

## 2017-03-12 NOTE — Assessment & Plan Note (Addendum)
Anticipate osteoarthritis predominantly lower extremities. Will return in 2 wks for labs (ESR, CBC). Has previously seen ortho Dr Mardelle Matte for knee OA (last note from 10/2015) Continue ibuprofen with tramadol for breakthrough pain.

## 2017-03-12 NOTE — Progress Notes (Signed)
BP 116/70 (BP Location: Left Arm, Patient Position: Sitting, Cuff Size: Large)   Pulse (!) 57   Temp 98.1 F (36.7 C) (Oral)   Wt 251 lb (113.9 kg)   SpO2 96%   BMI 38.16 kg/m    CC: f/u wound check Subjective:    Patient ID: Evan Avery, male    DOB: December 02, 1945, 71 y.o.   MRN: [id number]  HPI: Evan Avery is a 71 y.o. male presenting on 03/12/2017 for Cyst on neck (Says it is still draining some. Wife tells him it looks good.)   See prior notes for details. Here for wound check after I&D of epidermal cyst of neck. Finishing abx course.   Ongoing joint pains - severe foot arthritis previously recommended surgery but pt declines. Ongoing ankles, knees, hips, back. Upper extremities largely spared. No active synovitis. Steroid injections have not been helpful in the past. For plantar fasciitis - has used orthotics which are somewhat helpful.   Relevant past medical, surgical, family and social history reviewed and updated as indicated. Interim medical history since our last visit reviewed. Allergies and medications reviewed and updated. Outpatient Medications Prior to Visit  Medication Sig Dispense Refill  . aspirin 81 MG tablet Take 81 mg by mouth daily.      Marland Kitchen atenolol (TENORMIN) 50 MG tablet Take 1 tablet (50 mg total) by mouth daily.    Marland Kitchen atorvastatin (LIPITOR) 40 MG tablet TAKE ONE-HALF TABLET BY MOUTH ONCE DAILY 45 tablet 1  . Brinzolamide-Brimonidine (SIMBRINZA) 1-0.2 % SUSP Apply 1 drop to eye 3 (three) times daily.     . Cholecalciferol (VITAMIN D PO) Take 2,500 Units by mouth daily.    . clopidogrel (PLAVIX) 75 MG tablet TAKE ONE TABLET BY MOUTH ONCE DAILY 90 tablet 1  . Coenzyme Q10 200 MG capsule Take 200 mg by mouth daily.    . fenofibrate 160 MG tablet Take 1 tablet (160 mg total) by mouth daily. 30 tablet 12  . glimepiride (AMARYL) 2 MG tablet TAKE ONE TABLET BY MOUTH ONCE DAILY WITH BREAKFAST 90 tablet 2  . ibuprofen (ADVIL,MOTRIN) 200 MG tablet Take 600 mg  by mouth every 6 (six) hours as needed for mild pain.    Marland Kitchen latanoprost (XALATAN) 0.005 % ophthalmic solution Place 1 drop into both eyes at bedtime.     Marland Kitchen lisinopril (PRINIVIL,ZESTRIL) 2.5 MG tablet Take 1 tablet (2.5 mg total) by mouth daily. 90 tablet 2  . metFORMIN (GLUCOPHAGE) 1000 MG tablet Take 1 tablet (1,000 mg total) by mouth 2 (two) times daily with a meal. 180 tablet 1  . omega-3 acid ethyl esters (LOVAZA) 1 g capsule TAKE TWO CAPSULES BY MOUTH TWICE DAILY 360 capsule 2  . senna-docusate (SENOKOT-S) 8.6-50 MG tablet Take 1 tablet by mouth at bedtime as needed for mild constipation. 15 tablet 0  . traMADol (ULTRAM) 50 MG tablet Take 1 tablet (50 mg total) by mouth every 8 (eight) hours as needed. 30 tablet 0  . acetaminophen (TYLENOL) 500 MG tablet Take 1,000 mg by mouth every 6 (six) hours as needed for mild pain.    Marland Kitchen sulfamethoxazole-trimethoprim (BACTRIM DS,SEPTRA DS) 800-160 MG tablet Take 1 tablet by mouth 2 (two) times daily. 10 tablet 0   No facility-administered medications prior to visit.      Per HPI unless specifically indicated in ROS section below Review of Systems     Objective:    BP 116/70 (BP Location: Left Arm, Patient Position: Sitting, Cuff Size:  Large)   Pulse (!) 57   Temp 98.1 F (36.7 C) (Oral)   Wt 251 lb (113.9 kg)   SpO2 96%   BMI 38.16 kg/m   Wt Readings from Last 3 Encounters:  03/12/17 251 lb (113.9 kg)  03/08/17 255 lb (115.7 kg)  03/06/17 255 lb 12 oz (116 kg)    Physical Exam  Constitutional: He appears well-developed and well-nourished. No distress.  Musculoskeletal:  No active synovitis  Skin: Skin is warm and dry. No rash noted.  Wound posterior neck at site of epidermal cyst without surrounding erythema, dressing changed. Mild drainage expressed from wound  Nursing note and vitals reviewed.     Assessment & Plan:   Problem List Items Addressed This Visit    Epidermal cyst of neck - Primary    Has completed abx course.    Wound continues to heal. Dressing changed today.       Osteoarthritis    Anticipate osteoarthritis predominantly lower extremities. Will return in 2 wks for labs (ESR, CBC). Has previously seen ortho Dr Mardelle Matte for knee OA (last note from 10/2015) Continue ibuprofen with tramadol for breakthrough pain.       Relevant Orders   Sedimentation rate   CBC with Differential/Platelet       Follow up plan: No Follow-up on file.  [name], MD

## 2017-03-12 NOTE — Assessment & Plan Note (Signed)
Has completed abx course.  Wound continues to heal. Dressing changed today.

## 2017-03-12 NOTE — Patient Instructions (Addendum)
Continue daily dressing changes  Return in 2 weeks for labs.  Good to see you today, call us with questions.

## 2017-03-23 ENCOUNTER — Other Ambulatory Visit (INDEPENDENT_AMBULATORY_CARE_PROVIDER_SITE_OTHER): Payer: PPO

## 2017-03-23 DIAGNOSIS — M150 Primary generalized (osteo)arthritis: Secondary | ICD-10-CM | POA: Diagnosis not present

## 2017-03-23 DIAGNOSIS — M159 Polyosteoarthritis, unspecified: Secondary | ICD-10-CM

## 2017-03-23 LAB — CBC WITH DIFFERENTIAL/PLATELET
BASOS ABS: 0.1 10*3/uL (ref 0.0–0.1)
Basophils Relative: 1.4 % (ref 0.0–3.0)
Eosinophils Absolute: 0.3 10*3/uL (ref 0.0–0.7)
Eosinophils Relative: 5.6 % — ABNORMAL HIGH (ref 0.0–5.0)
HCT: 38 % — ABNORMAL LOW (ref 39.0–52.0)
Hemoglobin: 12.9 g/dL — ABNORMAL LOW (ref 13.0–17.0)
LYMPHS ABS: 1.7 10*3/uL (ref 0.7–4.0)
Lymphocytes Relative: 32.7 % (ref 12.0–46.0)
MCHC: 34 g/dL (ref 30.0–36.0)
MCV: 91.1 fl (ref 78.0–100.0)
MONOS PCT: 9.6 % (ref 3.0–12.0)
Monocytes Absolute: 0.5 10*3/uL (ref 0.1–1.0)
NEUTROS ABS: 2.7 10*3/uL (ref 1.4–7.7)
NEUTROS PCT: 50.7 % (ref 43.0–77.0)
PLATELETS: 153 10*3/uL (ref 150.0–400.0)
RBC: 4.17 Mil/uL — ABNORMAL LOW (ref 4.22–5.81)
RDW: 14.1 % (ref 11.5–15.5)
WBC: 5.3 10*3/uL (ref 4.0–10.5)

## 2017-03-23 LAB — SEDIMENTATION RATE: Sed Rate: 7 mm/hr (ref 0–20)

## 2017-03-23 NOTE — Addendum Note (Signed)
Addendum  created 03/23/17 0944 by Rica Koyanagi, MD   Sign clinical note

## 2017-04-11 ENCOUNTER — Other Ambulatory Visit: Payer: Self-pay | Admitting: Family Medicine

## 2017-04-11 NOTE — Telephone Encounter (Signed)
plz phone in. 

## 2017-04-12 ENCOUNTER — Other Ambulatory Visit: Payer: Self-pay | Admitting: *Deleted

## 2017-04-12 MED ORDER — TRAMADOL HCL 50 MG PO TABS
50.0000 mg | ORAL_TABLET | Freq: Three times a day (TID) | ORAL | 0 refills | Status: DC | PRN
Start: 2017-04-12 — End: 2017-07-26

## 2017-04-30 ENCOUNTER — Other Ambulatory Visit: Payer: Self-pay | Admitting: Cardiovascular Disease

## 2017-05-21 ENCOUNTER — Other Ambulatory Visit: Payer: Self-pay | Admitting: Family Medicine

## 2017-06-06 ENCOUNTER — Telehealth: Payer: Self-pay | Admitting: Family Medicine

## 2017-06-06 DIAGNOSIS — M159 Polyosteoarthritis, unspecified: Secondary | ICD-10-CM

## 2017-06-06 DIAGNOSIS — M150 Primary generalized (osteo)arthritis: Principal | ICD-10-CM

## 2017-06-06 NOTE — Telephone Encounter (Signed)
Caller Name:Maxon Furtick Relationship to Patient:self  Best [phone] or [phone] Pharmacy:  Reason for call: pt would like [place] to print out [facility] handicap form and fill it out . Please call when ready for pick up

## 2017-06-06 NOTE — Telephone Encounter (Signed)
Form in Dr Synthia Innocent in box in his office

## 2017-06-06 NOTE — Telephone Encounter (Signed)
Form filled out and in CMA box.

## 2017-06-07 NOTE — Telephone Encounter (Signed)
Lm on pts vm and informed him placard available for pickup from the front desk

## 2017-06-11 ENCOUNTER — Other Ambulatory Visit: Payer: Self-pay | Admitting: Cardiovascular Disease

## 2017-06-21 ENCOUNTER — Telehealth: Payer: Self-pay | Admitting: Family Medicine

## 2017-06-21 DIAGNOSIS — L720 Epidermal cyst: Secondary | ICD-10-CM

## 2017-06-21 NOTE — Telephone Encounter (Signed)
Appt made with Dr Syble Creek for 07/04/17 soonest appt available.

## 2017-06-21 NOTE — Telephone Encounter (Signed)
Seen pt at son's physical. Persistent draining from previous site of infected epidermal cyst I&D 02/2017. Concern for sinus tract developing. Will refer to derm - requests Dr Denna Haggard.

## 2017-07-04 DIAGNOSIS — L821 Other seborrheic keratosis: Secondary | ICD-10-CM | POA: Diagnosis not present

## 2017-07-04 DIAGNOSIS — L570 Actinic keratosis: Secondary | ICD-10-CM | POA: Diagnosis not present

## 2017-07-04 DIAGNOSIS — D229 Melanocytic nevi, unspecified: Secondary | ICD-10-CM | POA: Diagnosis not present

## 2017-07-04 DIAGNOSIS — L723 Sebaceous cyst: Secondary | ICD-10-CM | POA: Diagnosis not present

## 2017-07-16 ENCOUNTER — Other Ambulatory Visit: Payer: Self-pay | Admitting: Family Medicine

## 2017-07-26 ENCOUNTER — Encounter: Payer: Self-pay | Admitting: Cardiovascular Disease

## 2017-07-26 ENCOUNTER — Ambulatory Visit (INDEPENDENT_AMBULATORY_CARE_PROVIDER_SITE_OTHER): Payer: PPO | Admitting: Cardiovascular Disease

## 2017-07-26 VITALS — BP 164/72 | HR 76 | Ht 68.0 in | Wt 248.6 lb

## 2017-07-26 DIAGNOSIS — E785 Hyperlipidemia, unspecified: Secondary | ICD-10-CM | POA: Diagnosis not present

## 2017-07-26 DIAGNOSIS — Z79899 Other long term (current) drug therapy: Secondary | ICD-10-CM

## 2017-07-26 DIAGNOSIS — I2510 Atherosclerotic heart disease of native coronary artery without angina pectoris: Secondary | ICD-10-CM | POA: Diagnosis not present

## 2017-07-26 DIAGNOSIS — G4733 Obstructive sleep apnea (adult) (pediatric): Secondary | ICD-10-CM

## 2017-07-26 DIAGNOSIS — I10 Essential (primary) hypertension: Secondary | ICD-10-CM | POA: Diagnosis not present

## 2017-07-26 DIAGNOSIS — Z9989 Dependence on other enabling machines and devices: Secondary | ICD-10-CM

## 2017-07-26 DIAGNOSIS — Z720 Tobacco use: Secondary | ICD-10-CM

## 2017-07-26 MED ORDER — ATORVASTATIN CALCIUM 40 MG PO TABS: 40.0000 mg | ORAL_TABLET | Freq: Every day | ORAL | 3 refills | Status: DC

## 2017-07-26 MED ORDER — LISINOPRIL 5 MG PO TABS: 5.0000 mg | ORAL_TABLET | Freq: Every day | ORAL | 3 refills | Status: DC

## 2017-07-26 NOTE — Patient Instructions (Signed)
Medication Instructions:  STOP fenofibrate  INCREASE atorvastatin to 40 mg daily   INCREASE lisinopril to 5 mg daily  Labwork: Please return for FASTING labs in 2-3 MONTHS (CMET, CBC, Lipid, TSH)  Our in office lab hours are Monday-Friday 8:00-4:30, closed for lunch 1-2 pm.  No appointment needed.  Follow-Up: Your physician wants you to follow-up in: 6 MONTHS with Dr. [name].  You will receive a reminder letter in the mail two months in advance. If you don't receive a letter, please call our office to schedule the follow-up appointment.   Any Other Special Instructions Will Be Listed Below (If Applicable).    Steps to Quit Smoking Smoking tobacco can be bad for your health. It can also affect almost every organ in your body. Smoking puts you and people around you at risk for many serious long-lasting (chronic) diseases. Quitting smoking is hard, but it is one of the best things that you can do for your health. It is never too late to quit. What are the benefits of quitting smoking? When you quit smoking, you lower your risk for getting serious diseases and conditions. They can include:  Lung cancer or lung disease.  Heart disease.  Stroke.  Heart attack.  Not being able to have children (infertility).  Weak bones (osteoporosis) and broken bones (fractures).  If you have coughing, wheezing, and shortness of breath, those symptoms may get better when you quit. You may also get sick less often. If you are pregnant, quitting smoking can help to lower your chances of having a baby of low birth weight. What can I do to help me quit smoking? Talk with your doctor about what can help you quit smoking. Some things you can do (strategies) include:  Quitting smoking totally, instead of slowly cutting back how much you smoke over a period of time.  Going to in-person counseling. You are more likely to quit if you go to many counseling sessions.  Using resources and support systems,  such as: ? Database administrator with a Social worker. ? Phone quitlines. ? Careers information officer. ? Support groups or group counseling. ? Text messaging programs. ? Mobile phone apps or applications.  Taking medicines. Some of these medicines may have nicotine in them. If you are pregnant or breastfeeding, do not take any medicines to quit smoking unless your doctor says it is okay. Talk with your doctor about counseling or other things that can help you.  Talk with your doctor about using more than one strategy at the same time, such as taking medicines while you are also going to in-person counseling. This can help make quitting easier. What things can I do to make it easier to quit? Quitting smoking might feel very hard at first, but there is a lot that you can do to make it easier. Take these steps:  Talk to your family and friends. Ask them to support and encourage you.  Call phone quitlines, reach out to support groups, or work with a Social worker.  Ask people who smoke to not smoke around you.  Avoid places that make you want (trigger) to smoke, such as: ? Bars. ? Parties. ? Smoke-break areas at work.  Spend time with people who do not smoke.  Lower the stress in your life. Stress can make you want to smoke. Try these things to help your stress: ? Getting regular exercise. ? Deep-breathing exercises. ? Yoga. ? Meditating. ? Doing a body scan. To do this, close your eyes, focus on  one area of your body at a time from head to toe, and notice which parts of your body are tense. Try to relax the muscles in those areas.  Download or buy apps on your mobile phone or tablet that can help you stick to your quit plan. There are many free apps, such as QuitGuide from the [employer] Office manager for Disease Control and Prevention). You can find more support from smokefree.gov and other websites.  This information is not intended to replace advice given to you by your health care provider. Make sure you  discuss any questions you have with your health care provider. Document Released: 08/05/2009 Document Revised: 06/06/2016 Document Reviewed: 02/23/2015 Elsevier Interactive Patient Education  [employer].   If you need a refill on your cardiac medications before your next appointment, please call your pharmacy.  ]

## 2017-07-26 NOTE — Progress Notes (Signed)
Patient ID: Evan Avery, male   DOB: 01-25-1946, 71 y.o.   MRN: [id number]   Primary M.D.: Dr. Danise Mina  HPI: Evan Avery is a 71 y.o. male who presents today for a 68 month cardiology evaluation.  Evan Avery has known CAD and has a total of 4 stents in his RCA. In 1992, he underwent initial PTCA of his RCA. Subsequently, he underwent stenting of the proximal and mid RCA.  In 1999 his fourth stent was placed in the mid RCA between the 2 previously placed stents and was a 3.5x28 mm non-DES Liberty stent. His last nuclear perfusion study in August 2012 showed inferior thinning without significant ischemia.  EF was not calculated secondary to  ectopy but previously had been 52%.  Additional problems include hypertension, hyperlipidemia, obesity, and obstructive sleep apnea for which he is he uses CPAP 100% of the time and uses  [place] for his [employer].  Since I last saw him in May 2017.  He denied any recurrent episodes of chest pain or significant dyspnea.  He had a new CPAP machine and continue to be compliant.  He established primary care with Dr. Danise Mina.  Review of laboratory that he had with him in December 2016 has shown a cholesterol of 149, HDL 27, but his triglycerides were 413.  Remotely he had triglyceride elevations as high as 584.  In December, his hemoglobin A1c was 7.7.  Over the past 17 months, he underwent surgery in July 2017 due to an abscess in his colon.  He required a colostomy for 6 months and in January 2018 underwent colostomy closure.  He has continued to use CPAP with 100% compliance.  He is unaware of recurrent anginal symptoms.  He denies palpitations.  He had quit tobacco for over 10 years, but unfortunately resumed smoking in June 2018 and is smoking one pack per day.  He presents for evaluation.  Past Medical History:  Diagnosis Date  . Agent orange exposure 1968  . Arthritis   . CAD (coronary artery disease)    4 stents in right artery  .  Complex tear of medial meniscus of left knee as current injury 07/26/2012   [place]  . Diabetes mellitus    type 2  . Diverticulitis of colon with perforation 05/23/2016  . Glaucoma 2006   on drops, followed by [place]  . Hyperlipidemia   . Hypertension   . Intra-abdominal abscess ([place]) 05/16/2016  . LUMBAR RADICULOPATHY 07/18/2007   Qualifier: Diagnosis of  By: Arnoldo Morale MD, Balinda Quails   . Myocardial infarction [facility])    1992  . Sleep apnea    uses CPAP regularly    Past Surgical History:  Procedure Laterality Date  . APPENDECTOMY  1958  . BOWEL RESECTION N/A 05/23/2016   Procedure: SMALL BOWEL RESECTION;  Surgeon: Jackolyn Confer, MD;  Location: [facility];  Service: General;  Laterality: N/A;  . CARDIAC CATHETERIZATION  08/11/2008   angiographically patent LAD diagonal, circumflex and multiple circumflex obtuse marginal branches from the L coronary artery; stenting of mid R coronary artery with 3.5 x 2m Liberte non DES postdilated w/ 4.0 DuraStent ([name]  . CARDIOVASCULAR STRESS TEST  06/16/2011   R/P MV - moderate perfusion defect in basal inferoseptal, basal inferior, mid inferseptal and mid inferior regions, consistent with infarct/scar and/or diaphragmatic attenuation; non-gated study secondary to ectopy; no CP or EKG changes for ischemia; abnormal study although no significant changes from previous study; in the absense of gated images  cannot calculate EF or distinguish scar/artifact  . COLONOSCOPY  08/2016   TAx5, sessile serrated polyp, diverticulosis, rpt 2 yrs ([place])  . COLOSTOMY Left 05/23/2016   Procedure: COLOSTOMY;  Surgeon: Jackolyn Confer, MD;  Location: [facility];  Service: General;  Laterality: Left;  . COLOSTOMY TAKEDOWN N/A 11/10/2016   Procedure: LAPAROSCOPIC LYSIS OF ADHESIONS AND  COLOSTOMY CLOSURE;  Surgeon: Jackolyn Confer, MD;  Location: [facility];  Service: General;  Laterality: N/A;  . DOPPLER ECHOCARDIOGRAPHY  08/20/2008   EF >53%; LV systolic function normal; LV mildly dilated;  doppler flow suggestive of impaired LV relaxation; RV mildly dilated, RV systolic function normal, RV systolic pressure normal  . INCISION AND DRAINAGE ABSCESS N/A 05/23/2016   Procedure: DRAINAGE OF MULTIPLE INTRA  ABDOMINAL ABSCESS;  Surgeon: Jackolyn Confer, MD;  Location: [facility];  Service: General;  Laterality: N/A;  . KNEE SURGERY Left 2015   torn meniscus [place])  . LAPAROSCOPIC LYSIS OF ADHESIONS  11/10/2016   Procedure: LAPAROSCOPIC LYSIS OF ADHESIONS;  Surgeon: Jackolyn Confer, MD;  Location: [facility];  Service: General;;  . LAPAROTOMY N/A 05/23/2016   Procedure: EXPLORATORY LAPAROTOMY;  Surgeon: Jackolyn Confer, MD;  Location: [facility];  Service: General;  Laterality: N/A;  . PERCUTANEOUS CORONARY STENT INTERVENTION (PCI-S)  1992, 1999, 2004   4 vessels [name])  . PROCTOSCOPY  11/10/2016   Procedure: PROCTOSCOPY;  Surgeon: Jackolyn Confer, MD;  Location: [facility];  Service: General;;    Allergies  Allergen Reactions  . Dilaudid [Hydromorphone Hcl] Itching  . Ambien [Zolpidem] Other (See Comments)    Pt states "makes me wild"  . Niacin And Related Other (See Comments)    Extreme fatigue    Current Outpatient Prescriptions  Medication Sig Dispense Refill  . aspirin 81 MG tablet Take 81 mg by mouth daily.      Marland Kitchen atenolol (TENORMIN) 50 MG tablet TAKE ONE & ONE-HALF TABLETS BY MOUTH ONCE DAILY 135 tablet 0  . atorvastatin (LIPITOR) 40 MG tablet Take 1 tablet (40 mg total) by mouth daily at 6 PM. 90 tablet 3  . Brinzolamide-Brimonidine (SIMBRINZA) 1-0.2 % SUSP Apply 1 drop to eye 3 (three) times daily.     . clopidogrel (PLAVIX) 75 MG tablet TAKE ONE TABLET BY MOUTH ONCE DAILY 90 tablet 1  . Coenzyme Q10 200 MG capsule Take 200 mg by mouth daily.    Marland Kitchen glimepiride (AMARYL) 2 MG tablet TAKE ONE TABLET BY MOUTH ONCE DAILY WITH BREAKFAST 90 tablet 2  . ibuprofen (ADVIL,MOTRIN) 200 MG tablet Take 600 mg by mouth every 6 (six) hours as needed for mild pain.    Marland Kitchen latanoprost (XALATAN)  0.005 % ophthalmic solution Place 1 drop into both eyes at bedtime.     Marland Kitchen lisinopril (PRINIVIL,ZESTRIL) 5 MG tablet Take 1 tablet (5 mg total) by mouth daily. 90 tablet 3  . metFORMIN (GLUCOPHAGE) 1000 MG tablet Take 1 tablet (1,000 mg total) by mouth 2 (two) times daily with a meal. 180 tablet 1  . omega-3 acid ethyl esters (LOVAZA) 1 g capsule TAKE TWO CAPSULES BY MOUTH TWICE DAILY 360 capsule 2  . senna-docusate (SENOKOT-S) 8.6-50 MG tablet Take 1 tablet by mouth at bedtime as needed for mild constipation. 15 tablet 0   No current facility-administered medications for this visit.     Socially he is married has 2 children 2 grandchildren. He does walk several times a week. He does have a tobacco history. There is no alcohol use.   ROS General:  Negative; No fevers, chills, or night sweats; positive for obesity. HEENT: Negative; No changes in vision or hearing, sinus congestion, difficulty swallowing Pulmonary: Negative; No cough, wheezing, shortness of breath, hemoptysis Cardiovascular: Negative; No chest pain, presyncope, syncope, palpatations GI: Colonic abscess requiring colostomy, and ultimate closure GU: Negative; No dysuria, hematuria, or difficulty voiding Musculoskeletal: He is status post left knee surgery; no myalgias, joint pain, or weakness Hematologic/Oncology: Negative; no easy bruising, bleeding Endocrine: Negative; no heat/cold intolerance; no diabetes Neuro: Negative; no changes in balance, headaches Skin: Negative; No rashes or skin lesions Psychiatric: Negative; No behavioral problems, depression Sleep: Positive for sleep apnea, on CPAP with 100% compliance. No snoring, daytime sleepiness, hypersomnolence, bruxism, restless legs, hypnogognic hallucinations, no cataplexy Other comprehensive 14 point system review is negative.   PE BP (!) 164/72   Pulse 76   Ht _0  (1.727 m)   Wt 248 lb 9.6 oz (112.8 kg)   BMI 37.80 kg/m    Repeat blood pressure by me was  156/78  Wt Readings from Last 3 Encounters:  07/26/17 248 lb 9.6 oz (112.8 kg)  03/12/17 251 lb (113.9 kg)  03/08/17 255 lb (115.7 kg)   General: Alert, oriented, no distress.  Skin: normal turgor, no rashes, warm and dry HEENT: Normocephalic, atraumatic. Pupils equal round and reactive to light; sclera anicteric; extraocular muscles intact;  Nose without nasal septal hypertrophy Mouth/Parynx benign; Mallinpatti scale 3/4 Neck: No JVD, no carotid bruits; normal carotid upstroke Lungs: clear to ausculatation and percussion; no wheezing or rales Chest wall: without tenderness to palpitation Heart: PMI not displaced, RRR, s1 s2 normal, 1/6 systolic murmur, no diastolic murmur, no rubs, gallops, thrills, or heaves Abdomen: Moderate central adiposity.  Healed surgical scars.  soft, nontender; no hepatosplenomehaly, BS+; abdominal aorta nontender and not dilated by palpation. Back: no CVA tenderness Pulses 2+ Musculoskeletal: full range of motion, normal strength, no joint deformities Extremities: no clubbing cyanosis or edema, Homan's sign negative  Neurologic: grossly nonfocal; Cranial nerves grossly wnl Psychologic: Normal mood and affect   ECG (independently read by me): Normal sinus rhythm with PAC.  PR interval 168 ms.  QTc interval 454 ms.  May 2017 ECG (independently read by me): Sinus bradycardia 52 bpm.  T-wave abnormality in leads 3 and aVF.  May 2016 ECG (independently read by me): Sinus bradycardia 53 bpm.  Nondiagnostic T changes with T-wave inversion in lead 3.  June 2015 ECG (independently read by me):  sinus bradycardia at 55 QTc interval 447 ms.  PR interval 174 ms.  Nondiagnostic T changes in lead 3.  ECG: Sinus rhythm at 56 beats per minute. No ectopy. Normal intervals; nonspecific T changes in lead 3.  LABS: BMP Latest Ref Rng & Units 12/06/2016 11/12/2016 11/11/2016  Glucose 70 - 99 mg/dL 175(H) 138(H) 139(H)  BUN 6 - 23 mg/dL 17 18 24(H)  Creatinine 0.40 - 1.50  mg/dL 1.11 1.10 1.30(H)  Sodium 135 - 145 mEq/L 142 138 138  Potassium 3.5 - 5.1 mEq/L 4.5 3.7 4.1  Chloride 96 - 112 mEq/L 107 107 107  CO2 19 - 32 mEq/L _1 Calcium 8.4 - 10.5 mg/dL 9.7 8.5(L) 8.7(L)   Hepatic Function Latest Ref Rng & Units 12/06/2016 11/07/2016 06/01/2016  Total Protein 6.0 - 8.3 g/dL 6.6 7.1 5.7(L)  Albumin 3.5 - 5.2 g/dL 4.4 4.6 2.2(L)  AST 0 - 37 U/L _2 ALT 0 - 53 U/L 22 36 26  Alk Phosphatase 39 - 117 U/L  43 48 90  Total Bilirubin 0.2 - 1.2 mg/dL 0.4 0.4 0.6  Bilirubin, Direct 0.0 - 0.3 mg/dL - - -    CBC Latest Ref Rng & Units 03/23/2017 11/11/2016 11/07/2016  WBC 4.0 - 10.5 K/uL 5.3 9.7 7.4  Hemoglobin 13.0 - 17.0 g/dL 12.9(L) 10.8(L) 12.4(L)  Hematocrit 39.0 - 52.0 % 38.0(L) 32.0(L) 36.2(L)  Platelets 150.0 - 400.0 K/uL 153.0 146(L) 170   Lab Results  Component Value Date   TSH 1.113 05/16/2016   Lab Results  Component Value Date   HGBA1C 7.0 (H) 11/07/2016   Lipid Panel     Component Value Date/Time   CHOL 129 12/06/2016 0825   CHOL 149 03/13/2016 0803   TRIG 120.0 12/06/2016 0825   TRIG 387 (H) 03/13/2016 0803   HDL 35.30 (L) 12/06/2016 0825   HDL 26 (L) 03/13/2016 0803   CHOLHDL 4 12/06/2016 0825   VLDL 24.0 12/06/2016 0825   LDLCALC 69 12/06/2016 0825   LDLCALC 46 03/13/2016 0803   LDLDIRECT 58.0 10/05/2015 0813    RADIOLOGY: No results found.  IMPRESSION:  1. CAD in native artery   2. Essential hypertension, benign   3. Hyperlipidemia with target LDL less than 70   4. OSA on CPAP   5. Medication management   6. Tobacco abuse   7. Morbid obesity ([place])      ASSESSMENT AND PLAN: Mr. Thorpe  is a 71 year old Caucasian male who has CAD and underwent initial PTCA to his RCA in 1992.  Marland Kitchen His last catheterization was in October 2009 when his fourth stent was placed in his RCA.  He has continued to be free of recurrent anginal symptomatology.  He continues to be on atenolol 75 mg in the morning.  In addition to lisinopril 2.5  mg daily.  His blood pressure today is elevated.  On this regimen.  I am increasing lisinopril to 5 mg daily.  He has moderate obesity with a BMI of 37.8.  I strongly encouraged weight loss.  He is diabetic.  He has been on [place] per minute.  In addition to metformin.  He is now on atorvastatin at only 20 mg for hyperlipidemia and fenofibrate.  In February 2018, cholesterol was 129, HDL 35, triglycerides were improved at 120 from 222, and LDL was 69.  I have suggested he discontinue fenofibrate and will further titrate atorvastatin to 40 mg.  We discussed improved diet with reference to sweets and carbohydrates.  Unfortunately, he has resumed tobacco use.  I discussed the importance of smoking cessation.  In 3 months.  Repeat laboratory will be obtained consisting of a comprehensive metabolic panel, TSH, CBC, and lipid panel.  He is on CPAP therapy and admits to 100% compliance.  He cannot sleep without it.  I will see him in 6 months for reevaluation.   Time spent: 25 minutes  Troy Sine, MD, [facility]  08/01/2017 6:56 PM

## 2017-08-03 ENCOUNTER — Telehealth: Payer: Self-pay | Admitting: Family Medicine

## 2017-08-03 NOTE — Telephone Encounter (Signed)
error 

## 2017-08-09 ENCOUNTER — Other Ambulatory Visit: Payer: Self-pay | Admitting: Dermatology

## 2017-08-09 DIAGNOSIS — L720 Epidermal cyst: Secondary | ICD-10-CM | POA: Diagnosis not present

## 2017-08-09 DIAGNOSIS — L432 Lichenoid drug reaction: Secondary | ICD-10-CM | POA: Diagnosis not present

## 2017-08-09 DIAGNOSIS — L723 Sebaceous cyst: Secondary | ICD-10-CM | POA: Diagnosis not present

## 2017-08-21 DIAGNOSIS — L089 Local infection of the skin and subcutaneous tissue, unspecified: Secondary | ICD-10-CM | POA: Diagnosis not present

## 2017-09-28 ENCOUNTER — Encounter: Payer: Self-pay | Admitting: Family Medicine

## 2017-09-28 ENCOUNTER — Ambulatory Visit: Payer: PPO | Admitting: Family Medicine

## 2017-09-28 VITALS — BP 138/68 | HR 80 | Temp 98.0°F | Wt 245.2 lb

## 2017-09-28 DIAGNOSIS — F17200 Nicotine dependence, unspecified, uncomplicated: Secondary | ICD-10-CM | POA: Diagnosis not present

## 2017-09-28 DIAGNOSIS — B9689 Other specified bacterial agents as the cause of diseases classified elsewhere: Secondary | ICD-10-CM | POA: Diagnosis not present

## 2017-09-28 DIAGNOSIS — J208 Acute bronchitis due to other specified organisms: Secondary | ICD-10-CM

## 2017-09-28 DIAGNOSIS — E1165 Type 2 diabetes mellitus with hyperglycemia: Secondary | ICD-10-CM | POA: Diagnosis not present

## 2017-09-28 DIAGNOSIS — E118 Type 2 diabetes mellitus with unspecified complications: Secondary | ICD-10-CM

## 2017-09-28 DIAGNOSIS — IMO0002 Reserved for concepts with insufficient information to code with codable children: Secondary | ICD-10-CM

## 2017-09-28 MED ORDER — AZITHROMYCIN 250 MG PO TABS: ORAL_TABLET | ORAL | 0 refills | Status: DC

## 2017-09-28 NOTE — Assessment & Plan Note (Signed)
Cover with azithromycin given comorbidities.  Further supportive care reviewed.  Update if not improving with treatment.  Red flags to seek further care reviewed.

## 2017-09-28 NOTE — Assessment & Plan Note (Signed)
He has restarted smoking.  Encouraged full cessation.

## 2017-09-28 NOTE — Progress Notes (Signed)
BP 138/68 (BP Location: Left Arm, Patient Position: Sitting, Cuff Size: Large)   Pulse 80   Temp 98 F (36.7 C) (Oral)   Wt 245 lb 4 oz (111.2 kg)   SpO2 95%   BMI 37.29 kg/m    CC: cough, fever Subjective:    Patient ID: Evan Avery, male    DOB: February 18, 1946, 71 y.o.   MRN: [id number]  HPI: VALLEN CALABRESE is a 71 y.o. male presenting on 09/28/2017 for Cough (productive, worse in evening. Started 09/23/17, worsening. Has whole body hurting from coughing. Tried Mucinex and Nyquil); Fever (100.1); and Tick Removal (on chest)   6d h/o increased productive cough with increased sputum production, increased dyspnea, body aches, Tmax 100.1 last night along with dizziness. Some wheezing. Some headache. Some chest soreness from coughg.  No ear or tooth pain, ST, head congestion.   Treating with mucinex and nyquil.  H/o sepsis 2010's.  No sick contacts at home. Current smoker 1 ppd. Previously up to 2 ppd.   Relevant past medical, surgical, family and social history reviewed and updated as indicated. Interim medical history since our last visit reviewed. Allergies and medications reviewed and updated. Outpatient Medications Prior to Visit  Medication Sig Dispense Refill  . aspirin 81 MG tablet Take 81 mg by mouth daily.      Marland Kitchen atenolol (TENORMIN) 50 MG tablet TAKE ONE & ONE-HALF TABLETS BY MOUTH ONCE DAILY 135 tablet 0  . atorvastatin (LIPITOR) 40 MG tablet Take 1 tablet (40 mg total) by mouth daily at 6 PM. 90 tablet 3  . Brinzolamide-Brimonidine (SIMBRINZA) 1-0.2 % SUSP Apply 1 drop to eye 3 (three) times daily.     . clopidogrel (PLAVIX) 75 MG tablet TAKE ONE TABLET BY MOUTH ONCE DAILY 90 tablet 1  . Coenzyme Q10 200 MG capsule Take 200 mg by mouth daily.    Marland Kitchen glimepiride (AMARYL) 2 MG tablet TAKE ONE TABLET BY MOUTH ONCE DAILY WITH BREAKFAST 90 tablet 2  . ibuprofen (ADVIL,MOTRIN) 200 MG tablet Take 600 mg by mouth every 6 (six) hours as needed for mild pain.    Marland Kitchen latanoprost  (XALATAN) 0.005 % ophthalmic solution Place 1 drop into both eyes at bedtime.     Marland Kitchen lisinopril (PRINIVIL,ZESTRIL) 5 MG tablet Take 1 tablet (5 mg total) by mouth daily. 90 tablet 3  . metFORMIN (GLUCOPHAGE) 1000 MG tablet Take 1 tablet (1,000 mg total) by mouth 2 (two) times daily with a meal. 180 tablet 1  . omega-3 acid ethyl esters (LOVAZA) 1 g capsule TAKE TWO CAPSULES BY MOUTH TWICE DAILY 360 capsule 2  . senna-docusate (SENOKOT-S) 8.6-50 MG tablet Take 1 tablet by mouth at bedtime as needed for mild constipation. 15 tablet 0   No facility-administered medications prior to visit.      Per HPI unless specifically indicated in ROS section below Review of Systems     Objective:    BP 138/68 (BP Location: Left Arm, Patient Position: Sitting, Cuff Size: Large)   Pulse 80   Temp 98 F (36.7 C) (Oral)   Wt 245 lb 4 oz (111.2 kg)   SpO2 95%   BMI 37.29 kg/m   Wt Readings from Last 3 Encounters:  09/28/17 245 lb 4 oz (111.2 kg)  07/26/17 248 lb 9.6 oz (112.8 kg)  03/12/17 251 lb (113.9 kg)    Physical Exam  Constitutional: He appears well-developed and well-nourished. No distress.  HENT:  Head: Normocephalic and atraumatic.  Right Ear: Hearing,  tympanic membrane, external ear and ear canal normal.  Left Ear: Hearing, tympanic membrane, external ear and ear canal normal.  Nose: Mucosal edema (and erythema with nasal mucosal congestion) present. No rhinorrhea. Right sinus exhibits no maxillary sinus tenderness and no frontal sinus tenderness. Left sinus exhibits no maxillary sinus tenderness and no frontal sinus tenderness.  Mouth/Throat: Uvula is midline, oropharynx is clear and moist and mucous membranes are normal. No oropharyngeal exudate, posterior oropharyngeal edema, posterior oropharyngeal erythema or tonsillar abscesses.  Canals with cerumen, TMs clear  Eyes: Conjunctivae and EOM are normal. Pupils are equal, round, and reactive to light. No scleral icterus.  Neck: Normal  range of motion. Neck supple.  Cardiovascular: Normal rate, regular rhythm, normal heart sounds and intact distal pulses.  No murmur heard. Pulmonary/Chest: Effort normal and breath sounds normal. No respiratory distress. He has no wheezes. He has no rales.  Lungs largely clear  Lymphadenopathy:    He has no cervical adenopathy.  Skin: Skin is warm and dry. No rash noted.  Nursing note and vitals reviewed.     Assessment & Plan:   Problem List Items Addressed This Visit    Acute bacterial bronchitis - Primary    Cover with azithromycin given comorbidities.  Further supportive care reviewed.  Update if not improving with treatment.  Red flags to seek further care reviewed.      Relevant Medications   azithromycin (ZITHROMAX) 250 MG tablet   Diabetes mellitus type 2, uncontrolled, with complications ([place])   Smoker    He has restarted smoking.  Encouraged full cessation.           Follow up plan: Return if symptoms worsen or fail to improve.  [name], MD

## 2017-09-28 NOTE — Patient Instructions (Signed)
I think you have a bronchitis. Treat with zpack antibiotic.  Continue nyquil. Push fluids and rest. May use plain mucinex with plenty of water to help mobilize mucous.  If fever> 101 or worsening productive cough, return to be seen.

## 2017-10-22 ENCOUNTER — Other Ambulatory Visit: Payer: Self-pay | Admitting: Family Medicine

## 2017-10-26 ENCOUNTER — Other Ambulatory Visit: Payer: Self-pay | Admitting: Family Medicine

## 2017-11-03 IMAGING — CR DG ABDOMEN ACUTE W/ 1V CHEST
5 series · 5 of 5 positions shown · non-contrast
Comparison: Chest radiograph October 11, 2011

CLINICAL DATA: Constipation with lower abdominal pain

EXAM:
DG ABDOMEN ACUTE W/ 1V CHEST

[t abdomen supine (1 of 2)]
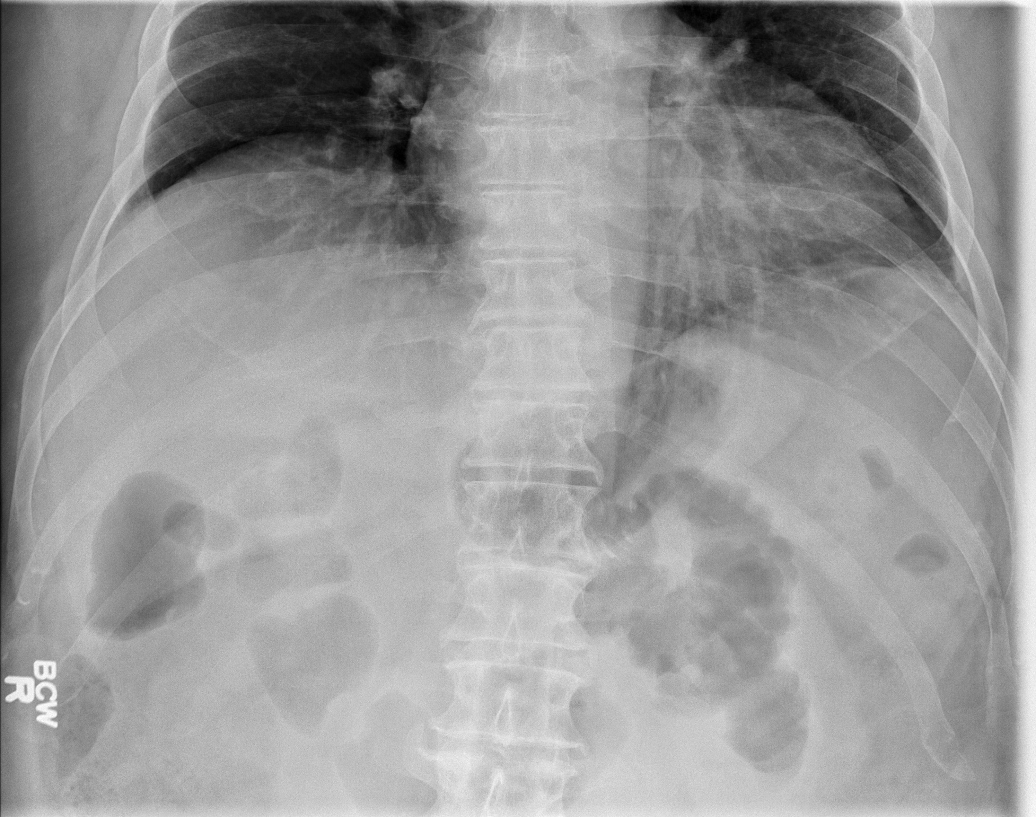

[t abdomen supine (2 of 2)]
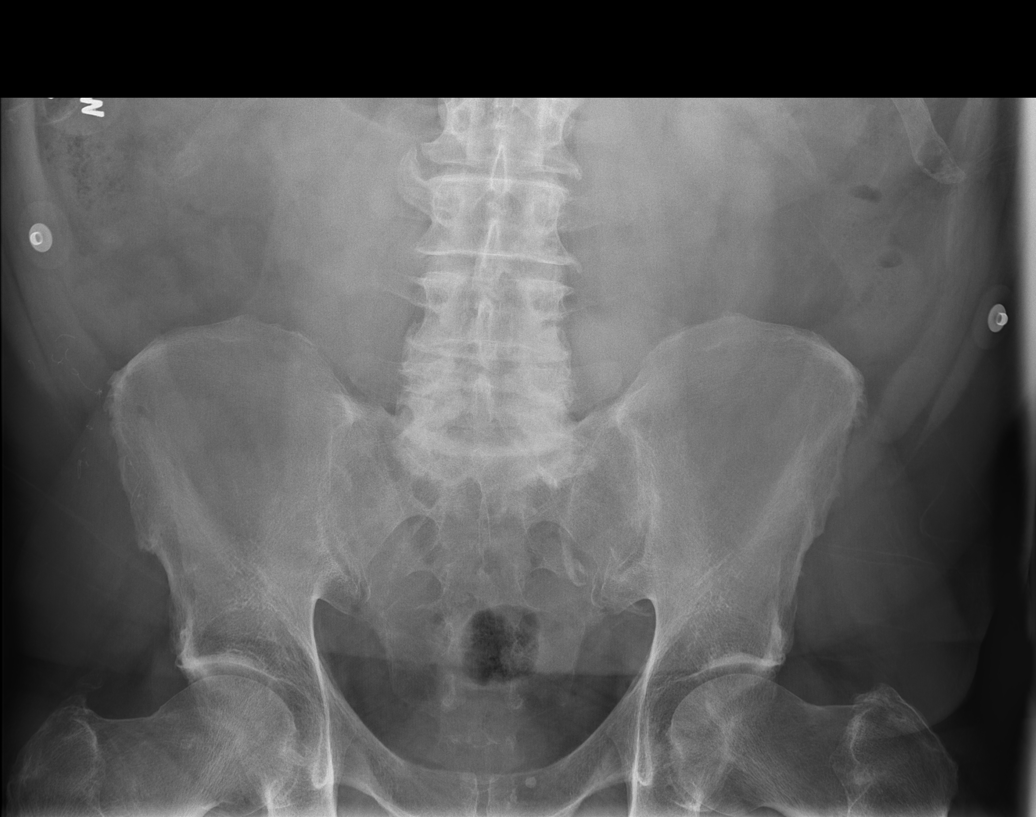

[w chest pa]
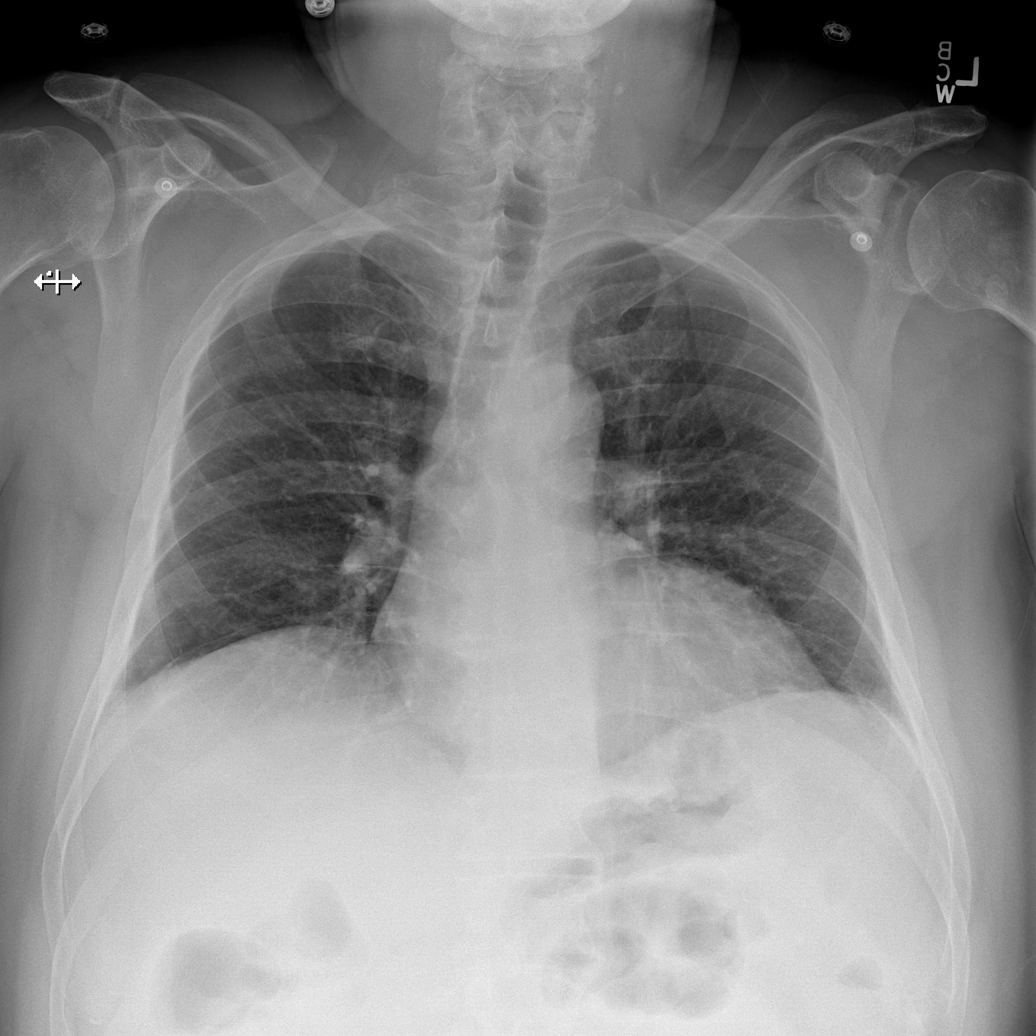

[w abdomen upright]
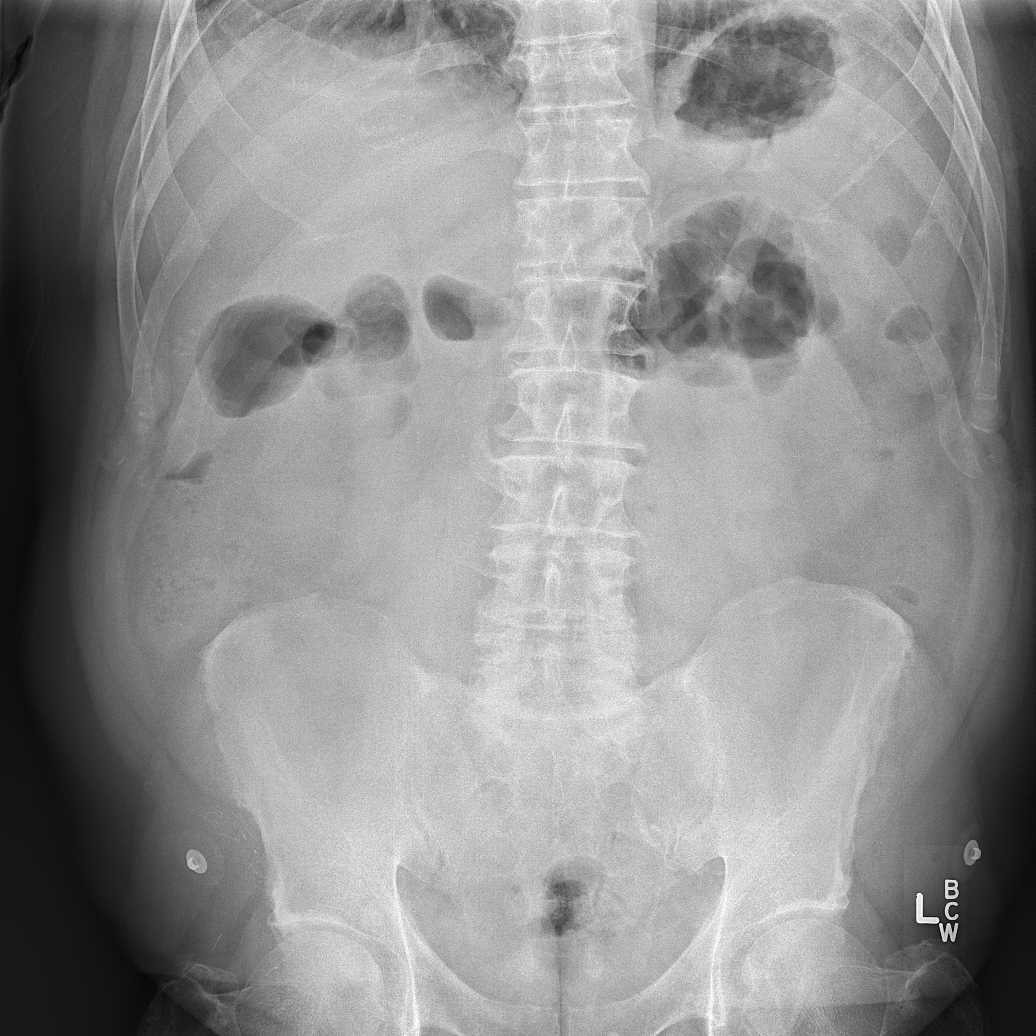

[w abdomen decub]
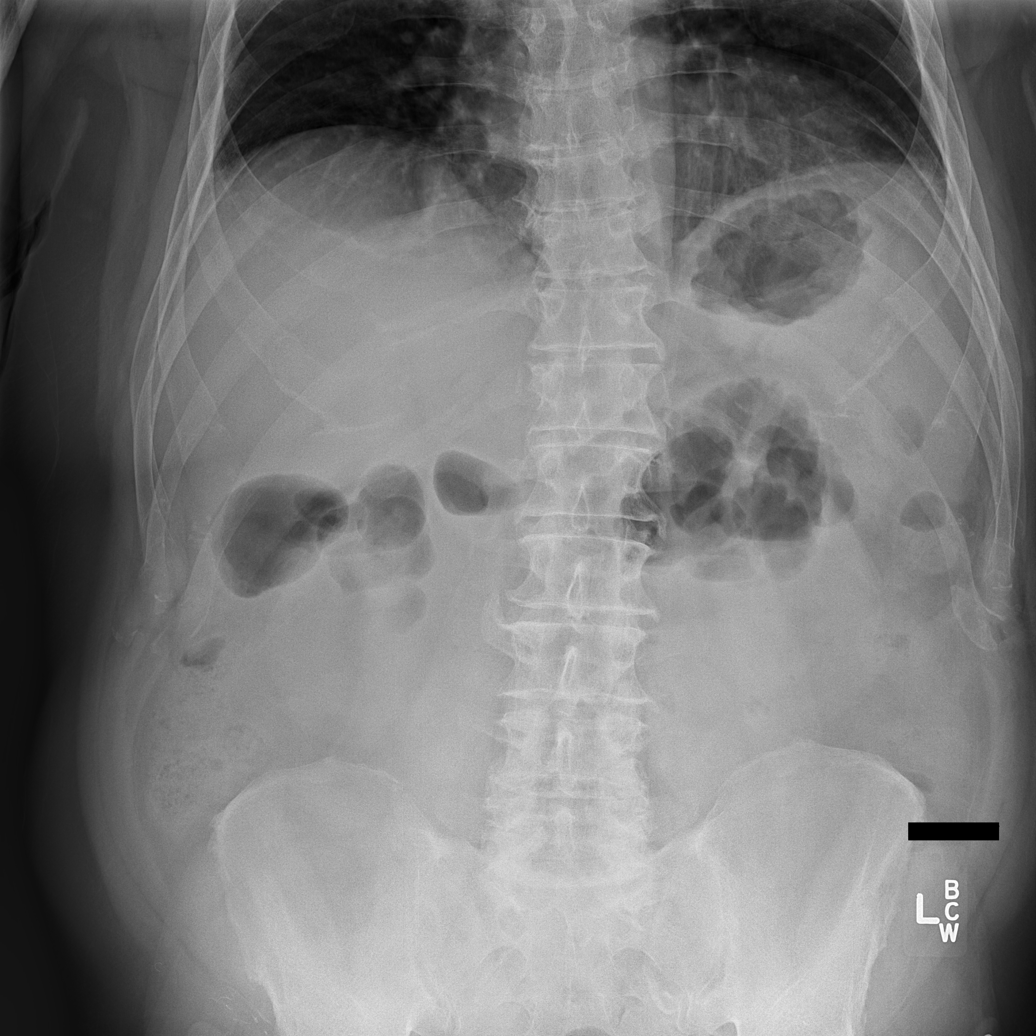

[5 of 5 positions shown; findings below may reference images not displayed]

FINDINGS: PA chest: Lungs are clear. Heart size and pulmonary vascularity are
normal. No adenopathy. There are foci of atherosclerotic
calcification in each carotid artery.

Supine and upright abdomen: There is moderate stool throughout
colon. There is no appreciable bowel dilatation. There are scattered
air-fluid levels in the abdomen, however. No free air is evident.
There is a small phlebolith in the pelvis.
IMPRESSION: Scattered air-fluid levels without bowel dilatation. Question early
ileus or enteritis. Bowel obstruction is felt to be less likely. No
free air. Moderate stool throughout colon. No lung edema or
consolidation. There are foci of carotid artery calcification
bilaterally.

## 2017-11-07 ENCOUNTER — Ambulatory Visit (INDEPENDENT_AMBULATORY_CARE_PROVIDER_SITE_OTHER): Payer: PPO

## 2017-11-07 DIAGNOSIS — Z23 Encounter for immunization: Secondary | ICD-10-CM | POA: Diagnosis not present

## 2017-12-07 ENCOUNTER — Ambulatory Visit: Payer: PPO

## 2017-12-10 ENCOUNTER — Encounter: Payer: PPO | Admitting: Family Medicine

## 2017-12-12 ENCOUNTER — Other Ambulatory Visit: Payer: Self-pay | Admitting: Family Medicine

## 2017-12-12 DIAGNOSIS — E118 Type 2 diabetes mellitus with unspecified complications: Principal | ICD-10-CM

## 2017-12-12 DIAGNOSIS — Z125 Encounter for screening for malignant neoplasm of prostate: Secondary | ICD-10-CM

## 2017-12-12 DIAGNOSIS — E1165 Type 2 diabetes mellitus with hyperglycemia: Secondary | ICD-10-CM

## 2017-12-12 DIAGNOSIS — E785 Hyperlipidemia, unspecified: Secondary | ICD-10-CM

## 2017-12-12 DIAGNOSIS — IMO0002 Reserved for concepts with insufficient information to code with codable children: Secondary | ICD-10-CM

## 2017-12-14 ENCOUNTER — Ambulatory Visit (INDEPENDENT_AMBULATORY_CARE_PROVIDER_SITE_OTHER): Payer: PPO

## 2017-12-14 VITALS — BP 130/70 | HR 55 | Temp 98.6°F | Ht 67.5 in | Wt 241.0 lb

## 2017-12-14 DIAGNOSIS — Z125 Encounter for screening for malignant neoplasm of prostate: Secondary | ICD-10-CM

## 2017-12-14 DIAGNOSIS — IMO0002 Reserved for concepts with insufficient information to code with codable children: Secondary | ICD-10-CM

## 2017-12-14 DIAGNOSIS — E118 Type 2 diabetes mellitus with unspecified complications: Secondary | ICD-10-CM

## 2017-12-14 DIAGNOSIS — Z0000 Encounter for general adult medical examination without abnormal findings: Secondary | ICD-10-CM | POA: Diagnosis not present

## 2017-12-14 DIAGNOSIS — E785 Hyperlipidemia, unspecified: Secondary | ICD-10-CM

## 2017-12-14 DIAGNOSIS — E1165 Type 2 diabetes mellitus with hyperglycemia: Secondary | ICD-10-CM

## 2017-12-14 LAB — COMPREHENSIVE METABOLIC PANEL
ALBUMIN: 4 g/dL (ref 3.5–5.2)
ALT: 26 U/L (ref 0–53)
AST: 15 U/L (ref 0–37)
Alkaline Phosphatase: 66 U/L (ref 39–117)
BUN: 19 mg/dL (ref 6–23)
CALCIUM: 9.8 mg/dL (ref 8.4–10.5)
CHLORIDE: 104 meq/L (ref 96–112)
CO2: 27 mEq/L (ref 19–32)
CREATININE: 1.05 mg/dL (ref 0.40–1.50)
GFR: 73.72 mL/min (ref >60.00)
Glucose, Bld: 281 mg/dL — ABNORMAL HIGH (ref 70–99)
POTASSIUM: 5.2 meq/L — AB (ref 3.5–5.1)
Sodium: 139 mEq/L (ref 135–145)
Total Bilirubin: 0.6 mg/dL (ref 0.2–1.2)
Total Protein: 6.7 g/dL (ref 6.0–8.3)

## 2017-12-14 LAB — LIPID PANEL
CHOLESTEROL: 193 mg/dL (ref 0–200)
HDL: 27.4 mg/dL — ABNORMAL LOW (ref >39.00)
Total CHOL/HDL Ratio: 7

## 2017-12-14 LAB — HEMOGLOBIN A1C: HEMOGLOBIN A1C: 9.8 % — AB (ref 4.6–6.5)

## 2017-12-14 LAB — PSA, MEDICARE: PSA: 0.63 ng/mL (ref 0.10–4.00)

## 2017-12-14 LAB — LDL CHOLESTEROL, DIRECT: LDL DIRECT: 51 mg/dL

## 2017-12-14 NOTE — Progress Notes (Signed)
Pre visit review using our clinic review tool, if applicable. No additional management support is needed unless otherwise documented below in the visit note. 

## 2017-12-14 NOTE — Progress Notes (Signed)
Subjective:   Evan Avery is a 72 y.o. male who presents for Medicare Annual/Subsequent preventive examination.  Review of Systems:  N/A Cardiac Risk Factors include: advanced age (>46men, >31 women);male gender;obesity (BMI >30kg/m2);diabetes mellitus;hypertension;dyslipidemia     Objective:    Vitals: BP 130/70 (BP Location: Right Arm, Patient Position: Sitting, Cuff Size: Normal)   Pulse (!) 55   Temp 98.6 F (37 C) (Oral)   Ht 5' 7.5" (1.715 m) Comment: no shoes  Wt 241 lb (109.3 kg)   SpO2 95%   BMI 37.19 kg/m   Body mass index is 37.19 kg/m.  Advanced Directives 12/14/2017 12/06/2016 11/10/2016 11/07/2016 06/21/2016 05/16/2016 07/24/2012  Does Patient Have a Medical Advance Directive? No No No No No Yes Patient does not have advance directive;Patient would not like information  Type of Advance Directive - - - - - Press photographer;Living will -  Does patient want to make changes to medical advance directive? - - - - - No - Patient declined -  Copy of [place] in Chart? - - - - - No - copy requested -  Would patient like information on creating a medical advance directive? No - Patient declined - No - Patient declined - Yes - Educational materials given No - patient declined information -  Pre-existing out of facility DNR order (yellow form or pink MOST form) - - - - - - -    Tobacco Social History   Tobacco Use  Smoking Status Current Every Day Smoker  . Packs/day: 2.00  . Years: 50.00  . Pack years: 100.00  . Types: Cigarettes  . Start date: 10/23/1958  Smokeless Tobacco Never Used     Ready to quit: No Counseling given: No   Clinical Intake:  Pre-visit preparation completed: Yes  Pain : No/denies pain Pain Score: 0-No pain     Nutritional Status: BMI > 30  Obese Nutritional Risks: None  How often do you need to have someone help you when you read instructions, pamphlets, or other written materials from your doctor or  pharmacy?: 1 - Never What is the last grade level you completed in school?: 12th grade  Interpreter Needed?: No  Comments: pt lives with spouse Information entered by :: LPinson, LPN  Past Medical History:  Diagnosis Date  . Agent orange exposure 1968  . Arthritis   . CAD (coronary artery disease)    4 stents in right artery  . Complex tear of medial meniscus of left knee as current injury 07/26/2012   [place]  . Diabetes mellitus    type 2  . Diverticulitis of colon with perforation 05/23/2016  . Glaucoma 2006   on drops, followed by [place]  . Hyperlipidemia   . Hypertension   . Intra-abdominal abscess ([place]) 05/16/2016  . LUMBAR RADICULOPATHY 07/18/2007   Qualifier: Diagnosis of  By: Arnoldo Morale MD, Balinda Quails   . Myocardial infarction [facility])    1992  . Sleep apnea    uses CPAP regularly   Past Surgical History:  Procedure Laterality Date  . APPENDECTOMY  1958  . BOWEL RESECTION N/A 05/23/2016   Procedure: SMALL BOWEL RESECTION;  Surgeon: Jackolyn Confer, MD;  Location: [facility];  Service: General;  Laterality: N/A;  . CARDIAC CATHETERIZATION  08/11/2008   angiographically patent LAD diagonal, circumflex and multiple circumflex obtuse marginal branches from the L coronary artery; stenting of mid R coronary artery with 3.5 x 19mm Liberte non DES postdilated w/ 4.0 DuraStent [name])  .  CARDIOVASCULAR STRESS TEST  06/16/2011   R/P MV - moderate perfusion defect in basal inferoseptal, basal inferior, mid inferseptal and mid inferior regions, consistent with infarct/scar and/or diaphragmatic attenuation; non-gated study secondary to ectopy; no CP or EKG changes for ischemia; abnormal study although no significant changes from previous study; in the absense of gated images cannot calculate EF or distinguish scar/artifact  . COLONOSCOPY  08/2016   TAx5, sessile serrated polyp, diverticulosis, rpt 2 yrs ([place])  . COLOSTOMY Left 05/23/2016   Procedure: COLOSTOMY;  Surgeon: Jackolyn Confer, MD;  Location:  [facility];  Service: General;  Laterality: Left;  . COLOSTOMY TAKEDOWN N/A 11/10/2016   Procedure: LAPAROSCOPIC LYSIS OF ADHESIONS AND  COLOSTOMY CLOSURE;  Surgeon: Jackolyn Confer, MD;  Location: [facility];  Service: General;  Laterality: N/A;  . DOPPLER ECHOCARDIOGRAPHY  08/20/2008   EF >53%; LV systolic function normal; LV mildly dilated; doppler flow suggestive of impaired LV relaxation; RV mildly dilated, RV systolic function normal, RV systolic pressure normal  . INCISION AND DRAINAGE ABSCESS N/A 05/23/2016   Procedure: DRAINAGE OF MULTIPLE INTRA  ABDOMINAL ABSCESS;  Surgeon: Jackolyn Confer, MD;  Location: [facility];  Service: General;  Laterality: N/A;  . KNEE SURGERY Left 2015   torn meniscus [facility] Ip)  . LAPAROSCOPIC LYSIS OF ADHESIONS  11/10/2016   Procedure: LAPAROSCOPIC LYSIS OF ADHESIONS;  Surgeon: Jackolyn Confer, MD;  Location: [facility];  Service: General;;  . LAPAROTOMY N/A 05/23/2016   Procedure: EXPLORATORY LAPAROTOMY;  Surgeon: Jackolyn Confer, MD;  Location: [facility];  Service: General;  Laterality: N/A;  . PERCUTANEOUS CORONARY STENT INTERVENTION (PCI-S)  1992, 1999, 2004   4 vessels [name])  . PROCTOSCOPY  11/10/2016   Procedure: PROCTOSCOPY;  Surgeon: Jackolyn Confer, MD;  Location: [facility];  Service: General;;   Family History  Problem Relation Age of Onset  . Alzheimer's disease Mother   . Heart disease Father   . Heart attack Brother   . Diabetes Brother   . Heart disease Maternal Grandmother   . Cancer Maternal Grandfather   . Diabetes Paternal Grandfather    Social History   Socioeconomic History  . Marital status: Married    Spouse name: None  . Number of children: None  . Years of education: None  . Highest education level: None  Social Needs  . Financial resource strain: None  . Food insecurity - worry: None  . Food insecurity - inability: None  . Transportation needs - medical: None  . Transportation needs - non-medical: None  Occupational History  . None    Tobacco Use  . Smoking status: Current Every Day Smoker    Packs/day: 2.00    Years: 50.00    Pack years: 100.00    Types: Cigarettes    Start date: 10/23/1958  . Smokeless tobacco: Never Used  Substance and Sexual Activity  . Alcohol use: Yes    Alcohol/week: 0.0 oz    Comment: occasionally  . Drug use: No  . Sexual activity: Yes  Other Topics Concern  . None  Social History Narrative   Lives with wife and son (with mental issues)   Occupation: retired, was Multimedia programmer for [employer]   Activity: no regular exercise   Diet: good water, fruits/vegetables daily    Outpatient Encounter Medications as of 12/14/2017  Medication Sig  . aspirin 81 MG tablet Take 81 mg by mouth daily.    Marland Kitchen atenolol (TENORMIN) 50 MG tablet TAKE ONE & ONE-HALF TABLETS BY MOUTH ONCE DAILY  .  atorvastatin (LIPITOR) 40 MG tablet Take 1 tablet (40 mg total) by mouth daily at 6 PM.  . Brinzolamide-Brimonidine (SIMBRINZA) 1-0.2 % SUSP Apply 1 drop to eye 3 (three) times daily.   . clopidogrel (PLAVIX) 75 MG tablet TAKE ONE TABLET BY MOUTH ONCE DAILY  . Coenzyme Q10 200 MG capsule Take 200 mg by mouth daily.  Marland Kitchen glimepiride (AMARYL) 2 MG tablet TAKE ONE TABLET BY MOUTH ONCE DAILY WITH BREAKFAST  . ibuprofen (ADVIL,MOTRIN) 200 MG tablet Take 600 mg by mouth every 6 (six) hours as needed for mild pain.  Marland Kitchen latanoprost (XALATAN) 0.005 % ophthalmic solution Place 1 drop into both eyes at bedtime.   Marland Kitchen lisinopril (PRINIVIL,ZESTRIL) 5 MG tablet Take 1 tablet (5 mg total) by mouth daily.  . metFORMIN (GLUCOPHAGE) 1000 MG tablet Take 1 tablet (1,000 mg total) by mouth 2 (two) times daily with a meal.  . omega-3 acid ethyl esters (LOVAZA) 1 g capsule TAKE 2 CAPSULES BY MOUTH TWICE DAILY  . senna-docusate (SENOKOT-S) 8.6-50 MG tablet Take 1 tablet by mouth at bedtime as needed for mild constipation.  . [DISCONTINUED] azithromycin (ZITHROMAX) 250 MG tablet Take two tablets on day one followed by one tablet on days 2-5    No facility-administered encounter medications on file as of 12/14/2017.     Activities of Daily Living In your present state of health, do you have any difficulty performing the following activities: 12/14/2017  Hearing? N  Vision? N  Difficulty concentrating or making decisions? N  Walking or climbing stairs? N  Dressing or bathing? N  Doing errands, shopping? N  Preparing Food and eating ? N  Using the Toilet? N  In the past six months, have you accidently leaked urine? N  Do you have problems with loss of bowel control? N  Managing your Medications? N  Managing your Finances? N  Housekeeping or managing your Housekeeping? N  Some recent data might be hidden    Patient Care Team: [name], MD as PCP - General (Family Medicine) Troy Sine, MD as Consulting Physician (Cardiology) Jackolyn Confer, MD as Consulting Physician (General Surgery)   Assessment:   This is a routine wellness examination for Larren.   Hearing Screening   125Hz  250Hz  500Hz  1000Hz  2000Hz  3000Hz  4000Hz  6000Hz  8000Hz   Right ear:   40 40 40  40    Left ear:   40 40 40  0    Vision Screening Comments: Last vision exam in 2018 @ [facility]; see eye doctor ever 3-4 months    Exercise Activities and Dietary recommendations Current Exercise Habits: The patient does not participate in regular exercise at present, Exercise limited by: orthopedic condition(s)  Goals    . DIET - INCREASE WATER INTAKE     Starting 12/14/2017, I will continue to drink at least 6-8 glasses of water daily.        Fall Risk Fall Risk  12/14/2017 12/06/2016 06/21/2016 10/22/2015 10/07/2014  Falls in the past year? No No No No No  Risk for fall due to : - - (No Data) - -  Risk for fall due to: Comment - - None - -   Depression Screen PHQ 2/9 Scores 12/14/2017 12/06/2016 06/21/2016 10/22/2015  PHQ - 2 Score 0 0 1 0  PHQ- 9 Score 0 - - -    Cognitive Function MMSE - Mini Mental State Exam 12/14/2017 12/06/2016   Orientation to time 5 5  Orientation to Place 5 5  Registration 3 3  Attention/ Calculation 0 0  Recall 3 3  Language- name 2 objects 0 0  Language- repeat 1 1  Language- follow 3 step command 3 3  Language- read & follow direction 0 0  Write a sentence 0 0  Copy design 0 0  Total score 20 20     PLEASE NOTE: A Mini-Cog screen was completed. Maximum score is 20. A value of 0 denotes this part of [place] MMSE was not completed or the patient failed this part of the Mini-Cog screening.   Mini-Cog Screening Orientation to Time - Max 5 pts Orientation to Place - Max 5 pts Registration - Max 3 pts Recall - Max 3 pts Language Repeat - Max 1 pts Language Follow 3 Step Command - Max 3 pts     Immunization History  Administered Date(s) Administered  . Influenza Split 08/02/2012  . Influenza,inj,Quad PF,6+ Mos 09/17/2013, 09/07/2014, 09/06/2016, 11/07/2017  . Influenza-Unspecified 09/28/2015  . Pneumococcal Conjugate-13 10/23/2013  . Pneumococcal-Unspecified 10/23/2012  . Td 10/23/2000  . Tdap 10/23/2012    Screening Tests Health Maintenance  Topic Date Due  . HEMOGLOBIN A1C  06/13/2018  . OPHTHALMOLOGY EXAM  07/23/2018  . COLONOSCOPY  09/13/2018  . FOOT EXAM  10/23/2018  . DTaP/Tdap/Td (2 - Td) 10/23/2022  . TETANUS/TDAP  10/23/2022  . INFLUENZA VACCINE  Completed  . Hepatitis C Screening  Completed  . PNA vac Low Risk Adult  Completed     Plan:     I have personally reviewed, addressed, and noted the following in the patient's chart:  A. Medical and social history B. Use of alcohol, tobacco or illicit drugs  C. Current medications and supplements D. Functional ability and status E.  Nutritional status F.  Physical activity G. Advance directives H. List of other physicians I.  Hospitalizations, surgeries, and ER visits in previous 12 months J.  [place] to include hearing, vision, cognitive, depression L. Referrals and appointments - none  In  addition, I have reviewed and discussed with patient certain preventive protocols, quality metrics, and best practice recommendations. A written personalized care plan for preventive services as well as general preventive health recommendations were provided to patient.  See attached scanned questionnaire for additional information.   Signed,   Lindell Noe, MHA, BS, LPN Health Coach

## 2017-12-14 NOTE — Patient Instructions (Signed)
Evan Avery , Thank you for taking time to come for your Medicare Wellness Visit. I appreciate your ongoing commitment to your health goals. Please review the following plan we discussed and let me know if I can assist you in the future.   These are the goals we discussed: Goals    . DIET - INCREASE WATER INTAKE     Starting 12/14/2017, I will continue to drink at least 6-8 glasses of water daily.        This is a list of the screening recommended for you and due dates:  Health Maintenance  Topic Date Due  . Hemoglobin A1C  06/13/2018  . Eye exam for diabetics  07/23/2018  . Colon Cancer Screening  09/13/2018  . Complete foot exam   10/23/2018  . DTaP/Tdap/Td vaccine (2 - Td) 10/23/2022  . Tetanus Vaccine  10/23/2022  . Flu Shot  Completed  .  Hepatitis C: One time screening is recommended by Center for Disease Control  (CDC) for  adults born from [birth year] through 1966.   Completed  . Pneumonia vaccines  Completed   Preventive Care for Adults  A healthy lifestyle and preventive care can promote health and wellness. Preventive health guidelines for adults include the following key practices.  . A routine yearly physical is a good way to check with your health care provider about your health and preventive screening. It is a chance to share any concerns and updates on your health and to receive a thorough exam.  . Visit your dentist for a routine exam and preventive care every 6 months. Brush your teeth twice a day and floss once a day. Good oral hygiene prevents tooth decay and gum disease.  . The frequency of eye exams is based on your age, health, family medical history, use  of contact lenses, and other factors. Follow your health care provider's recommendations for frequency of eye exams.  . Eat a healthy diet. Foods like vegetables, fruits, whole grains, low-fat dairy products, and lean protein foods contain the nutrients you need without too many calories. Decrease your intake of  foods high in solid fats, added sugars, and salt. Eat the right amount of calories for you. Get information about a proper diet from your health care provider, if necessary.  . Regular physical exercise is one of the most important things you can do for your health. Most adults should get at least 150 minutes of moderate-intensity exercise (any activity that increases your heart rate and causes you to sweat) each week. In addition, most adults need muscle-strengthening exercises on 2 or more days a week.  Silver Sneakers may be a benefit available to you. To determine eligibility, you may visit the website: www.silversneakers.com or contact program at (980)754-7857 Mon-Fri between 8AM-8PM.   . Maintain a healthy weight. The body mass index (BMI) is a screening tool to identify possible weight problems. It provides an estimate of body fat based on height and weight. Your health care provider can find your BMI and can help you achieve or maintain a healthy weight.   For adults 20 years and older: ? A BMI below 18.5 is considered underweight. ? A BMI of 18.5 to 24.9 is normal. ? A BMI of 25 to 29.9 is considered overweight. ? A BMI of 30 and above is considered obese.   . Maintain normal blood lipids and cholesterol levels by exercising and minimizing your intake of saturated fat. Eat a balanced diet with plenty of fruit and  vegetables. Blood tests for lipids and cholesterol should begin at age 30 and be repeated every 5 years. If your lipid or cholesterol levels are high, you are over 50, or you are at high risk for heart disease, you may need your cholesterol levels checked more frequently. Ongoing high lipid and cholesterol levels should be treated with medicines if diet and exercise are not working.  . If you smoke, find out from your health care provider how to quit. If you do not use tobacco, please do not start.  . If you choose to drink alcohol, please do not consume more than 2 drinks per  day. One drink is considered to be 12 ounces (355 mL) of beer, 5 ounces (148 mL) of wine, or 1.5 ounces (44 mL) of liquor.  . If you are 23-39 years old, ask your health care provider if you should take aspirin to prevent strokes.  . Use sunscreen. Apply sunscreen liberally and repeatedly throughout the day. You should seek shade when your shadow is shorter than you. Protect yourself by wearing long sleeves, pants, a wide-brimmed hat, and sunglasses year round, whenever you are outdoors.  . Once a month, do a whole body skin exam, using a mirror to look at the skin on your back. Tell your health care provider of new moles, moles that have irregular borders, moles that are larger than a pencil eraser, or moles that have changed in shape or color.

## 2017-12-14 NOTE — Progress Notes (Signed)
PCP notes:   Health maintenance:  A1C - completed  Abnormal screenings:   Hearing - failed  Hearing Screening   125Hz  250Hz  500Hz  1000Hz  2000Hz  3000Hz  4000Hz  6000Hz  8000Hz   Right ear:   40 40 40  40    Left ear:   40 40 40  0      Patient concerns:   None  Nurse concerns:  None  Next PCP appt:   12/18/17 @ 1500

## 2017-12-16 NOTE — Progress Notes (Signed)
I reviewed health advisor's note, was available for consultation, and agree with documentation and plan.  

## 2017-12-17 ENCOUNTER — Other Ambulatory Visit: Payer: Self-pay | Admitting: Family Medicine

## 2017-12-18 ENCOUNTER — Ambulatory Visit (INDEPENDENT_AMBULATORY_CARE_PROVIDER_SITE_OTHER): Payer: PPO | Admitting: Family Medicine

## 2017-12-18 ENCOUNTER — Encounter: Payer: Self-pay | Admitting: Family Medicine

## 2017-12-18 VITALS — BP 138/70 | HR 59 | Temp 97.9°F | Ht 68.0 in | Wt 245.0 lb

## 2017-12-18 DIAGNOSIS — M159 Polyosteoarthritis, unspecified: Secondary | ICD-10-CM

## 2017-12-18 DIAGNOSIS — Z7189 Other specified counseling: Secondary | ICD-10-CM | POA: Diagnosis not present

## 2017-12-18 DIAGNOSIS — F17200 Nicotine dependence, unspecified, uncomplicated: Secondary | ICD-10-CM

## 2017-12-18 DIAGNOSIS — E1165 Type 2 diabetes mellitus with hyperglycemia: Secondary | ICD-10-CM

## 2017-12-18 DIAGNOSIS — E785 Hyperlipidemia, unspecified: Secondary | ICD-10-CM

## 2017-12-18 DIAGNOSIS — J019 Acute sinusitis, unspecified: Secondary | ICD-10-CM | POA: Diagnosis not present

## 2017-12-18 DIAGNOSIS — I10 Essential (primary) hypertension: Secondary | ICD-10-CM

## 2017-12-18 DIAGNOSIS — M150 Primary generalized (osteo)arthritis: Secondary | ICD-10-CM | POA: Diagnosis not present

## 2017-12-18 DIAGNOSIS — I2510 Atherosclerotic heart disease of native coronary artery without angina pectoris: Secondary | ICD-10-CM

## 2017-12-18 DIAGNOSIS — B9689 Other specified bacterial agents as the cause of diseases classified elsewhere: Secondary | ICD-10-CM | POA: Insufficient documentation

## 2017-12-18 DIAGNOSIS — E118 Type 2 diabetes mellitus with unspecified complications: Secondary | ICD-10-CM | POA: Diagnosis not present

## 2017-12-18 DIAGNOSIS — Z77098 Contact with and (suspected) exposure to other hazardous, chiefly nonmedicinal, chemicals: Secondary | ICD-10-CM | POA: Diagnosis not present

## 2017-12-18 DIAGNOSIS — IMO0002 Reserved for concepts with insufficient information to code with codable children: Secondary | ICD-10-CM

## 2017-12-18 DIAGNOSIS — Z0000 Encounter for general adult medical examination without abnormal findings: Secondary | ICD-10-CM

## 2017-12-18 MED ORDER — GLIMEPIRIDE 4 MG PO TABS: 4.0000 mg | ORAL_TABLET | Freq: Every day | ORAL | 1 refills | Status: DC

## 2017-12-18 MED ORDER — FLUTICASONE PROPIONATE 50 MCG/ACT NA SUSP: 2.0000 | Freq: Every day | NASAL | 1 refills | Status: DC

## 2017-12-18 MED ORDER — AMOXICILLIN-POT CLAVULANATE 875-125 MG PO TABS
1.0000 | ORAL_TABLET | Freq: Two times a day (BID) | ORAL | 0 refills | Status: AC
Start: 2017-12-18 — End: 2017-12-28

## 2017-12-18 NOTE — Assessment & Plan Note (Signed)
Chronic. LDL at goal however marked deterioration of triglycerides (anticipate from uncontrolled hyperglycemia) and HDL. Will work towards better glycemic control then reassess. The ASCVD Risk score Evan Bussing DC Jr., et al., 2013) failed to calculate for the following reasons:   The patient has a prior MI or stroke diagnosis

## 2017-12-18 NOTE — Progress Notes (Signed)
BP 138/70 (BP Location: Left Arm, Patient Position: Sitting, Cuff Size: Large)   Pulse (!) 59   Temp 97.9 F (36.6 C) (Oral)   Ht 5' 8"  (1.727 m)   Wt 245 lb (111.1 kg)   SpO2 94%   BMI 37.25 kg/m    CC: CPE Subjective:    Patient ID: Evan Avery, male    DOB: 08/08/1946, 72 y.o.   MRN: [id number]  HPI: Evan Avery is a 72 y.o. male presenting on 12/18/2017 for Annual Exam (Pt 2.) and Dizziness (Started yesterday and a little blurred vision. Has some sinus congestion. Not sure if related.)   Saw Lesia last week for medicare wellness visit. Note reviewed.    Dizziness started yesterday associated with sinus congestion over the past 1+ week. Tried mucinex. No fevers/chills. Restarted smoking this past week.   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  Prostate cancer screening - discussed, may screen Q 2-3 yrs. Deferred today Lung cancer screening - agrees to schedule  Flu shot yearly Tdap 2014  Pneumovax 2014, prevnar 2015 zostavax - declines. Never had chicken pox.  shingrix - discussed Advanced directive discussion - does not want prolonged life support. Would want wife to be HCPOA. Packet provided previously. Seat belt use discussed  Sunscreen use discussed, no changing moles on skin.  Ex smoker - quit 04/2016 - restarted 1 ppd this past week. 79+ PY Alcohol - rare  Lives with wife and son (with mental issues) Occupation: retired, was Multimedia programmer for [employer] Activity: no regular exercise due to foot pain Diet: good water, fruits/vegetables daily  Relevant past medical, surgical, family and social history reviewed and updated as indicated. Interim medical history since our last visit reviewed. Allergies and medications reviewed and updated. Outpatient Medications Prior to Visit  Medication Sig Dispense Refill  . aspirin 81 MG tablet Take 81 mg by mouth daily.      Marland Kitchen atenolol  (TENORMIN) 50 MG tablet TAKE ONE & ONE-HALF TABLETS BY MOUTH ONCE DAILY 135 tablet 0  . atorvastatin (LIPITOR) 40 MG tablet Take 1 tablet (40 mg total) by mouth daily at 6 PM. 90 tablet 3  . Brinzolamide-Brimonidine (SIMBRINZA) 1-0.2 % SUSP Apply 1 drop to eye 3 (three) times daily.     . clopidogrel (PLAVIX) 75 MG tablet TAKE ONE TABLET BY MOUTH ONCE DAILY 90 tablet 1  . Coenzyme Q10 200 MG capsule Take 200 mg by mouth daily.    Marland Kitchen ibuprofen (ADVIL,MOTRIN) 200 MG tablet Take 600 mg by mouth every 6 (six) hours as needed for mild pain.    Marland Kitchen latanoprost (XALATAN) 0.005 % ophthalmic solution Place 1 drop into both eyes at bedtime.     Marland Kitchen lisinopril (PRINIVIL,ZESTRIL) 5 MG tablet Take 1 tablet (5 mg total) by mouth daily. 90 tablet 3  . metFORMIN (GLUCOPHAGE) 1000 MG tablet Take 1 tablet (1,000 mg total) by mouth 2 (two) times daily with a meal. 180 tablet 1  . omega-3 acid ethyl esters (LOVAZA) 1 g capsule TAKE 2 CAPSULES BY MOUTH TWICE DAILY 360 capsule 2  . senna-docusate (SENOKOT-S) 8.6-50 MG tablet Take 1 tablet by mouth at bedtime as needed for mild constipation. 15 tablet 0  . glimepiride (AMARYL) 2 MG tablet TAKE ONE TABLET BY MOUTH ONCE DAILY WITH BREAKFAST 90 tablet 1   No facility-administered medications prior to visit.      Per HPI unless specifically indicated in ROS section below  Review of Systems  Constitutional: Negative for activity change, appetite change, chills, fatigue, fever and unexpected weight change.  HENT: Positive for congestion and sinus pressure. Negative for hearing loss.   Eyes: Negative for visual disturbance.  Respiratory: Positive for cough. Negative for chest tightness, shortness of breath and wheezing.   Cardiovascular: Negative for chest pain, palpitations and leg swelling.  Gastrointestinal: Positive for diarrhea (loose stools). Negative for abdominal distention, abdominal pain, blood in stool, constipation, nausea and vomiting.  Genitourinary: Negative for  difficulty urinating and hematuria.  Musculoskeletal: Negative for arthralgias, myalgias and neck pain.  Skin: Negative for rash.  Neurological: Positive for dizziness. Negative for seizures, syncope and headaches.  Hematological: Negative for adenopathy. Does not bruise/bleed easily.  Psychiatric/Behavioral: Negative for dysphoric mood. The patient is not nervous/anxious.        Objective:    BP 138/70 (BP Location: Left Arm, Patient Position: Sitting, Cuff Size: Large)   Pulse (!) 59   Temp 97.9 F (36.6 C) (Oral)   Ht 5' 8"  (1.727 m)   Wt 245 lb (111.1 kg)   SpO2 94%   BMI 37.25 kg/m   Wt Readings from Last 3 Encounters:  12/18/17 245 lb (111.1 kg)  12/14/17 241 lb (109.3 kg)  09/28/17 245 lb 4 oz (111.2 kg)    Physical Exam  Constitutional: He is oriented to person, place, and time. He appears well-developed and well-nourished. No distress.  HENT:  Head: Normocephalic and atraumatic.  Right Ear: Hearing, tympanic membrane, external ear and ear canal normal.  Left Ear: Hearing, tympanic membrane, external ear and ear canal normal.  Nose: Mucosal edema present. No rhinorrhea. Right sinus exhibits no maxillary sinus tenderness and no frontal sinus tenderness. Left sinus exhibits no maxillary sinus tenderness and no frontal sinus tenderness.  Mouth/Throat: Uvula is midline, oropharynx is clear and moist and mucous membranes are normal. No oropharyngeal exudate, posterior oropharyngeal edema, posterior oropharyngeal erythema or tonsillar abscesses.  Eyes: Conjunctivae and EOM are normal. Pupils are equal, round, and reactive to light. No scleral icterus.  Neck: Normal range of motion. Neck supple. Carotid bruit is not present. No thyromegaly present.  Cardiovascular: Normal rate, regular rhythm, normal heart sounds and intact distal pulses.  No murmur heard. Pulses:      Radial pulses are 2+ on the right side, and 2+ on the left side.  Pulmonary/Chest: Effort normal and breath  sounds normal. No respiratory distress. He has no wheezes. He has no rales.  Abdominal: Soft. Bowel sounds are normal. He exhibits no distension and no mass. There is no tenderness. There is no rebound and no guarding.  Incisional hernia  Musculoskeletal: Normal range of motion. He exhibits no edema.  Lymphadenopathy:    He has no cervical adenopathy.  Neurological: He is alert and oriented to person, place, and time.  CN grossly intact, station and gait intact  Skin: Skin is warm and dry. No rash noted.  Psychiatric: He has a normal mood and affect. His behavior is normal. Judgment and thought content normal.  Nursing note and vitals reviewed.  Results for orders placed or performed in visit on 12/14/17  PSA, Medicare  Result Value Ref Range   PSA 0.63 0.10 - 4.00 ng/ml  Hemoglobin A1c  Result Value Ref Range   Hgb A1c MFr Bld 9.8 (H) 4.6 - 6.5 %  Lipid panel  Result Value Ref Range   Cholesterol 193 0 - 200 mg/dL   Triglycerides (H) 0.0 - 149.0 mg/dL  616.0 Triglyceride is over 400; calculations on Lipids are invalid.   HDL 27.40 (L) >39.00 mg/dL   Total CHOL/HDL Ratio 7   Comprehensive metabolic panel  Result Value Ref Range   Sodium 139 135 - 145 mEq/L   Potassium 5.2 (H) 3.5 - 5.1 mEq/L   Chloride 104 96 - 112 mEq/L   CO2 27 19 - 32 mEq/L   Glucose, Bld 281 (H) 70 - 99 mg/dL   BUN 19 6 - 23 mg/dL   Creatinine, Ser 1.05 0.40 - 1.50 mg/dL   Total Bilirubin 0.6 0.2 - 1.2 mg/dL   Alkaline Phosphatase 66 39 - 117 U/L   AST 15 0 - 37 U/L   ALT 26 0 - 53 U/L   Total Protein 6.7 6.0 - 8.3 g/dL   Albumin 4.0 3.5 - 5.2 g/dL   Calcium 9.8 8.4 - 10.5 mg/dL   GFR 73.72 >60.00 mL/min  LDL cholesterol, direct  Result Value Ref Range   Direct LDL 51.0 mg/dL  No results found for: ANA, RF      Assessment & Plan:   Problem List Items Addressed This Visit    Acute sinusitis    Anticipate bacterial given duration and progression of symptoms - Rx augmentin 10d course, flonase.  Update if not improving with treatment.       Relevant Medications   amoxicillin-clavulanate (AUGMENTIN) 875-125 MG tablet   fluticasone (FLONASE) 50 MCG/ACT nasal spray   Advanced care planning/counseling discussion    Advanced directive discussion - does not want prolonged life support. Would want wife to be HCPOA. Packet provided previously.      Agent orange exposure   CAD in native artery    Asxs. Continue aspirin, statin.       Diabetes mellitus type 2, uncontrolled, with complications (HCC)    Marked deterioration, unclear cause. Will increase amaryl to 65m daily. RTC 328mo/u visit.  Consider pancreas imaging if no improvement over next 3 months.       Relevant Medications   glimepiride (AMARYL) 4 MG tablet   Essential hypertension    Chronic, stable. Continue current regimen.       Health maintenance examination - Primary    Preventative protocols reviewed and updated unless pt declined. Discussed healthy diet and lifestyle.       Hyperlipidemia with target LDL less than 70    Chronic. LDL at goal however marked deterioration of triglycerides (anticipate from uncontrolled hyperglycemia) and HDL. Will work towards better glycemic control then reassess. The ASCVD Risk score (GMikey BussingC Jr., et al., 2013) failed to calculate for the following reasons:   The patient has a prior MI or stroke diagnosis       Osteoarthritis    Severe. Has had normal ESR/CBC.       Severe obesity (BMI 35.0-39.9) with comorbidity ([place]   Encouraged healthy diet choices to improve cholesterol, sugar and weight control.      Relevant Medications   glimepiride (AMARYL) 4 MG tablet   Smoker    Restarted smoking last few weeks. Encouraged complete cessation. Will refer for lung cancer screening.       Relevant Orders   Ambulatory Referral for Lung Cancer Scre       Meds ordered this encounter  Medications  . amoxicillin-clavulanate (AUGMENTIN) 875-125 MG tablet    Sig: Take 1 tablet  by mouth 2 (two) times daily for 10 days.    Dispense:  20 tablet    Refill:  0  . fluticasone (FLONASE) 50 MCG/ACT nasal spray    Sig: Place 2 sprays into both nostrils daily.    Dispense:  16 g    Refill:  1  . glimepiride (AMARYL) 4 MG tablet    Sig: Take 1 tablet (4 mg total) by mouth daily with breakfast.    Dispense:  90 tablet    Refill:  1   Orders Placed This Encounter  Procedures  . Ambulatory Referral for Lung Cancer Scre    Referral Priority:   Routine    Referral Type:   Consultation    Referral Reason:   Specialty Services Required    Number of Visits Requested:   1    Follow up plan: Return in about 3 months (around 03/17/2018) for follow up visit.  [name], MD

## 2017-12-18 NOTE — Assessment & Plan Note (Signed)
Preventative protocols reviewed and updated unless pt declined. Discussed healthy diet and lifestyle.  

## 2017-12-18 NOTE — Assessment & Plan Note (Signed)
Chronic, stable. Continue current regimen. 

## 2017-12-18 NOTE — Assessment & Plan Note (Signed)
Severe. Has had normal ESR/CBC.

## 2017-12-18 NOTE — Assessment & Plan Note (Signed)
Restarted smoking last few weeks. Encouraged complete cessation. Will refer for lung cancer screening.

## 2017-12-18 NOTE — Telephone Encounter (Signed)
Last filled:  07/16/17, #90 Last OV:  09/28/17 Next OV:  12/31/18

## 2017-12-18 NOTE — Assessment & Plan Note (Signed)
Marked deterioration, unclear cause. Will increase amaryl to 4mg  daily. RTC 66mo f/u visit.  Consider pancreas imaging if no improvement over next 3 months.

## 2017-12-18 NOTE — Assessment & Plan Note (Signed)
Asxs. Continue aspirin, statin.

## 2017-12-18 NOTE — Assessment & Plan Note (Signed)
Anticipate bacterial given duration and progression of symptoms - Rx augmentin 10d course, flonase. Update if not improving with treatment.

## 2017-12-18 NOTE — Assessment & Plan Note (Signed)
Encouraged healthy diet choices to improve cholesterol, sugar and weight control.

## 2017-12-18 NOTE — Patient Instructions (Addendum)
We will refer you for lung cancer screening CT scan [facility]).  For sinusitis - start flonase nasal steroid, start augmentin antibiotic for 10 days, stop smoking.  If interested, check with pharmacy about new 2 shot shingles series (shingrix).  Sugar and cholesterol levels were much worse - work on low sugar/carb diabetic diet . Increase glimepiride to 4mg  daily with breakfast. Work on advanced directives Return as needed or in 3 months for follow up visit.  Health Maintenance, Male A healthy lifestyle and preventive care is important for your health and wellness. Ask your health care provider about what schedule of regular examinations is right for you. What should I know about weight and diet? Eat a Healthy Diet  Eat plenty of vegetables, fruits, whole grains, low-fat dairy products, and lean protein.  Do not eat a lot of foods high in solid fats, added sugars, or salt.  Maintain a Healthy Weight Regular exercise can help you achieve or maintain a healthy weight. You should:  Do at least 150 minutes of exercise each week. The exercise should increase your heart rate and make you sweat (moderate-intensity exercise).  Do strength-training exercises at least twice a week.  Watch Your Levels of Cholesterol and Blood Lipids  Have your blood tested for lipids and cholesterol every 5 years starting at 73 years of age. If you are at high risk for heart disease, you should start having your blood tested when you are 73 years old. You may need to have your cholesterol levels checked more often if: ? Your lipid or cholesterol levels are high. ? You are older than 73 years of age. ? You are at high risk for heart disease.  What should I know about cancer screening? Many types of cancers can be detected early and may often be prevented. Lung Cancer  You should be screened every year for lung cancer if: ? You are a current smoker who has smoked for at least 30 years. ? You are a former  smoker who has quit within the past 15 years.  Talk to your health care provider about your screening options, when you should start screening, and how often you should be screened.  Colorectal Cancer  Routine colorectal cancer screening usually begins at 73 years of age and should be repeated every 5-10 years until you are 73 years old. You may need to be screened more often if early forms of precancerous polyps or small growths are found. Your health care provider may recommend screening at an earlier age if you have risk factors for colon cancer.  Your health care provider may recommend using home test kits to check for hidden blood in the stool.  A small camera at the end of a tube can be used to examine your colon (sigmoidoscopy or colonoscopy). This checks for the earliest forms of colorectal cancer.  Prostate and Testicular Cancer  Depending on your age and overall health, your health care provider may do certain tests to screen for prostate and testicular cancer.  Talk to your health care provider about any symptoms or concerns you have about testicular or prostate cancer.  Skin Cancer  Check your skin from head to toe regularly.  Tell your health care provider about any new moles or changes in moles, especially if: ? There is a change in a mole's size, shape, or color. ? You have a mole that is larger than a pencil eraser.  Always use sunscreen. Apply sunscreen liberally and repeat throughout the day.  Protect yourself by wearing long sleeves, pants, a wide-brimmed hat, and sunglasses when outside.  What should I know about heart disease, diabetes, and high blood pressure?  If you are 1-43 years of age, have your blood pressure checked every 3-5 years. If you are 43 years of age or older, have your blood pressure checked every year. You should have your blood pressure measured twice-once when you are at a hospital or clinic, and once when you are not at a hospital or clinic.  Record the average of the two measurements. To check your blood pressure when you are not at a hospital or clinic, you can use: ? An automated blood pressure machine at a pharmacy. ? A home blood pressure monitor.  Talk to your health care provider about your target blood pressure.  If you are between 34-28 years old, ask your health care provider if you should take aspirin to prevent heart disease.  Have regular diabetes screenings by checking your fasting blood sugar level. ? If you are at a normal weight and have a low risk for diabetes, have this test once every three years after the age of 73. ? If you are overweight and have a high risk for diabetes, consider being tested at a younger age or more often.  A one-time screening for abdominal aortic aneurysm (AAA) by ultrasound is recommended for men aged 41-75 years who are current or former smokers. What should I know about preventing infection? Hepatitis B If you have a higher risk for hepatitis B, you should be screened for this virus. Talk with your health care provider to find out if you are at risk for hepatitis B infection. Hepatitis C Blood testing is recommended for:  Everyone born from [birth year] through 1966.  Anyone with known risk factors for hepatitis C.  Sexually Transmitted Diseases (STDs)  You should be screened each year for STDs including gonorrhea and chlamydia if: ? You are sexually active and are younger than 73 years of age. ? You are older than 73 years of age and your health care provider tells you that you are at risk for this type of infection. ? Your sexual activity has changed since you were last screened and you are at an increased risk for chlamydia or gonorrhea. Ask your health care provider if you are at risk.  Talk with your health care provider about whether you are at high risk of being infected with HIV. Your health care provider may recommend a prescription medicine to help prevent HIV  infection.  What else can I do?  Schedule regular health, dental, and eye exams.  Stay current with your vaccines (immunizations).  Do not use any tobacco products, such as cigarettes, chewing tobacco, and e-cigarettes. If you need help quitting, ask your health care provider.  Limit alcohol intake to no more than 2 drinks per day. One drink equals 12 ounces of beer, 5 ounces of wine, or 1 ounces of hard liquor.  Do not use street drugs.  Do not share needles.  Ask your health care provider for help if you need support or information about quitting drugs.  Tell your health care provider if you often feel depressed.  Tell your health care provider if you have ever been abused or do not feel safe at home. This information is not intended to replace advice given to you by your health care provider. Make sure you discuss any questions you have with your health care provider. Document Released: 04/06/2008 Document  Revised: 06/07/2016 Document Reviewed: 07/13/2015 Elsevier Interactive Patient Education  [employer].

## 2017-12-18 NOTE — Assessment & Plan Note (Signed)
Advanced directive discussion - does not want prolonged life support. Would want wife to be HCPOA. Packet provided previously.

## 2017-12-19 ENCOUNTER — Telehealth: Payer: Self-pay | Admitting: *Deleted

## 2017-12-19 NOTE — Telephone Encounter (Signed)
Received referral for low dose lung cancer screening CT scan. Message left at phone number listed in EMR for patient to call me back to facilitate scheduling scan.  

## 2017-12-26 ENCOUNTER — Telehealth: Payer: Self-pay | Admitting: *Deleted

## 2017-12-26 NOTE — Telephone Encounter (Signed)
Received referral for low dose lung cancer screening CT scan. Message left at phone number listed in EMR for patient to call me back to facilitate scheduling scan.  

## 2018-01-02 ENCOUNTER — Other Ambulatory Visit: Payer: Self-pay | Admitting: Family Medicine

## 2018-01-24 ENCOUNTER — Encounter: Payer: Self-pay | Admitting: *Deleted

## 2018-03-13 ENCOUNTER — Other Ambulatory Visit: Payer: Self-pay | Admitting: Family Medicine

## 2018-03-13 DIAGNOSIS — E118 Type 2 diabetes mellitus with unspecified complications: Secondary | ICD-10-CM

## 2018-03-13 DIAGNOSIS — E1165 Type 2 diabetes mellitus with hyperglycemia: Secondary | ICD-10-CM

## 2018-03-13 DIAGNOSIS — E785 Hyperlipidemia, unspecified: Secondary | ICD-10-CM

## 2018-03-13 DIAGNOSIS — IMO0002 Reserved for concepts with insufficient information to code with codable children: Secondary | ICD-10-CM

## 2018-03-14 ENCOUNTER — Other Ambulatory Visit (INDEPENDENT_AMBULATORY_CARE_PROVIDER_SITE_OTHER): Payer: PPO

## 2018-03-14 DIAGNOSIS — E118 Type 2 diabetes mellitus with unspecified complications: Secondary | ICD-10-CM | POA: Diagnosis not present

## 2018-03-14 DIAGNOSIS — E1165 Type 2 diabetes mellitus with hyperglycemia: Secondary | ICD-10-CM

## 2018-03-14 DIAGNOSIS — E785 Hyperlipidemia, unspecified: Secondary | ICD-10-CM | POA: Diagnosis not present

## 2018-03-14 DIAGNOSIS — IMO0002 Reserved for concepts with insufficient information to code with codable children: Secondary | ICD-10-CM

## 2018-03-14 LAB — HEMOGLOBIN A1C: HEMOGLOBIN A1C: 8.9 % — AB (ref 4.6–6.5)

## 2018-03-14 LAB — LDL CHOLESTEROL, DIRECT: Direct LDL: 61 mg/dL

## 2018-03-14 LAB — COMPREHENSIVE METABOLIC PANEL
ALT: 23 U/L (ref 0–53)
AST: 17 U/L (ref 0–37)
Albumin: 4.3 g/dL (ref 3.5–5.2)
Alkaline Phosphatase: 54 U/L (ref 39–117)
BILIRUBIN TOTAL: 0.7 mg/dL (ref 0.2–1.2)
BUN: 17 mg/dL (ref 6–23)
CO2: 29 meq/L (ref 19–32)
Calcium: 10.1 mg/dL (ref 8.4–10.5)
Chloride: 104 mEq/L (ref 96–112)
Creatinine, Ser: 1.05 mg/dL (ref 0.40–1.50)
GFR: 73.67 mL/min (ref >60.00)
GLUCOSE: 233 mg/dL — AB (ref 70–99)
POTASSIUM: 4.9 meq/L (ref 3.5–5.1)
Sodium: 140 mEq/L (ref 135–145)
Total Protein: 7 g/dL (ref 6.0–8.3)

## 2018-03-14 LAB — LIPID PANEL
Cholesterol: 122 mg/dL (ref 0–200)
HDL: 27.9 mg/dL — ABNORMAL LOW (ref >39.00)
NONHDL: 94.31
Total CHOL/HDL Ratio: 4
Triglycerides: 247 mg/dL — ABNORMAL HIGH (ref 0.0–149.0)
VLDL: 49.4 mg/dL — AB (ref 0.0–40.0)

## 2018-03-20 ENCOUNTER — Encounter: Payer: Self-pay | Admitting: Family Medicine

## 2018-03-20 ENCOUNTER — Ambulatory Visit (INDEPENDENT_AMBULATORY_CARE_PROVIDER_SITE_OTHER): Payer: PPO | Admitting: Family Medicine

## 2018-03-20 VITALS — BP 140/70 | HR 56 | Temp 97.9°F | Ht 68.0 in | Wt 240.5 lb

## 2018-03-20 DIAGNOSIS — E785 Hyperlipidemia, unspecified: Secondary | ICD-10-CM | POA: Diagnosis not present

## 2018-03-20 DIAGNOSIS — E118 Type 2 diabetes mellitus with unspecified complications: Secondary | ICD-10-CM | POA: Diagnosis not present

## 2018-03-20 DIAGNOSIS — L720 Epidermal cyst: Secondary | ICD-10-CM | POA: Diagnosis not present

## 2018-03-20 DIAGNOSIS — F17200 Nicotine dependence, unspecified, uncomplicated: Secondary | ICD-10-CM | POA: Diagnosis not present

## 2018-03-20 DIAGNOSIS — E1165 Type 2 diabetes mellitus with hyperglycemia: Secondary | ICD-10-CM | POA: Diagnosis not present

## 2018-03-20 DIAGNOSIS — IMO0002 Reserved for concepts with insufficient information to code with codable children: Secondary | ICD-10-CM

## 2018-03-20 MED ORDER — SITAGLIPTIN PHOSPHATE 50 MG PO TABS: 50.0000 mg | ORAL_TABLET | Freq: Every day | ORAL | 3 refills | Status: DC

## 2018-03-20 NOTE — Assessment & Plan Note (Signed)
Recurrent s/p I&D 02/2017 and excision by derm last year. Desires definitive eval - will refer to [facility]

## 2018-03-20 NOTE — Assessment & Plan Note (Signed)
Congratulated on 5 lb weight loss.

## 2018-03-20 NOTE — Progress Notes (Signed)
BP 140/70 (BP Location: Left Arm, Patient Position: Sitting, Cuff Size: Large)   Pulse (!) 56   Temp 97.9 F (36.6 C) (Oral)   Ht 5\' 8"  (1.727 m)   Wt 240 lb 8 oz (109.1 kg)   SpO2 95%   BMI 36.57 kg/m    CC: 3 mo f/u visit DM Subjective:    Patient ID: Evan Avery, male    DOB: Jul 12, 1946, 72 y.o.   MRN: [id number]  HPI: Evan Avery is a 72 y.o. male presenting on 03/20/2018 for Follow-up   DM - does regularly check sugars this morning 240. Compliant with antihyperglycemic regimen which includes: amaryl 4mg  daily, metformin 1000mg  bid. Denies low sugars or hypoglycemic symptoms. Denies paresthesias. Last diabetic eye exam 07/2017. Pneumovax: 2014. Prevnar: 2015. Glucometer brand: accucheck. DSME: not interested. Lab Results  Component Value Date   HGBA1C 8.9 (H) 03/14/2018   Diabetic Foot Exam - Simple   No data filed     Lab Results  Component Value Date   MICROALBUR 0.2 12/27/2007     Never returned calls to schedule lung cancer screening. Unsure if he's ready to have this done. Encouraged he call to reschedule appt. Continues smoking.   Ongoing trouble with posterior neck cyst. Recurrence s/p I&D by me then excision by derm Denna Haggard). Has recurred x2. Interested in [place] eval.   Relevant past medical, surgical, family and social history reviewed and updated as indicated. Interim medical history since our last visit reviewed. Allergies and medications reviewed and updated. Outpatient Medications Prior to Visit  Medication Sig Dispense Refill  . aspirin 81 MG tablet Take 81 mg by mouth daily.      Marland Kitchen atenolol (TENORMIN) 50 MG tablet TAKE 1 & 1/2 (ONE & ONE-HALF) TABLETS BY MOUTH ONCE DAILY 135 tablet 0  . atorvastatin (LIPITOR) 40 MG tablet Take 1 tablet (40 mg total) by mouth daily at 6 PM. 90 tablet 3  . Brinzolamide-Brimonidine (SIMBRINZA) 1-0.2 % SUSP Apply 1 drop to eye 3 (three) times daily.     . clopidogrel (PLAVIX) 75 MG tablet TAKE 1 TABLET BY MOUTH ONCE  DAILY 90 tablet 1  . Coenzyme Q10 200 MG capsule Take 200 mg by mouth daily.    . fluticasone (FLONASE) 50 MCG/ACT nasal spray Place 2 sprays into both nostrils daily. 16 g 1  . glimepiride (AMARYL) 4 MG tablet Take 1 tablet (4 mg total) by mouth daily with breakfast. 90 tablet 1  . ibuprofen (ADVIL,MOTRIN) 200 MG tablet Take 600 mg by mouth every 6 (six) hours as needed for mild pain.    Marland Kitchen latanoprost (XALATAN) 0.005 % ophthalmic solution Place 1 drop into both eyes at bedtime.     Marland Kitchen lisinopril (PRINIVIL,ZESTRIL) 5 MG tablet Take 1 tablet (5 mg total) by mouth daily. 90 tablet 3  . metFORMIN (GLUCOPHAGE) 1000 MG tablet Take 1 tablet (1,000 mg total) by mouth 2 (two) times daily with a meal. 180 tablet 1  . omega-3 acid ethyl esters (LOVAZA) 1 g capsule TAKE 2 CAPSULES BY MOUTH TWICE DAILY 360 capsule 2  . senna-docusate (SENOKOT-S) 8.6-50 MG tablet Take 1 tablet by mouth at bedtime as needed for mild constipation. 15 tablet 0   No facility-administered medications prior to visit.      Per HPI unless specifically indicated in ROS section below Review of Systems     Objective:    BP 140/70 (BP Location: Left Arm, Patient Position: Sitting, Cuff Size: Large)   Pulse Marland Kitchen)  56   Temp 97.9 F (36.6 C) (Oral)   Ht 5\' 8"  (1.727 m)   Wt 240 lb 8 oz (109.1 kg)   SpO2 95%   BMI 36.57 kg/m   Wt Readings from Last 3 Encounters:  03/20/18 240 lb 8 oz (109.1 kg)  12/18/17 245 lb (111.1 kg)  12/14/17 241 lb (109.3 kg)    Physical Exam  Constitutional: He appears well-developed and well-nourished. No distress.  HENT:  Head: Normocephalic and atraumatic.  Mouth/Throat: No oropharyngeal exudate.  Dry MM  Eyes: Pupils are equal, round, and reactive to light. Conjunctivae and EOM are normal. No scleral icterus.  Neck: Normal range of motion. Neck supple.  Cardiovascular: Normal rate, regular rhythm, normal heart sounds and intact distal pulses.  No murmur heard. Pulmonary/Chest: Effort normal  and breath sounds normal. No respiratory distress. He has no wheezes. He has no rales.  Musculoskeletal: He exhibits no edema.  See HPI for foot exam if done  Lymphadenopathy:    He has no cervical adenopathy.  Skin: Skin is warm and dry. No rash noted.  Posterior neck with residual sebaceous cyst that has already drained  Psychiatric: He has a normal mood and affect.  Nursing note and vitals reviewed.  Results for orders placed or performed in visit on 03/14/18  Hemoglobin A1c  Result Value Ref Range   Hgb A1c MFr Bld 8.9 (H) 4.6 - 6.5 %  Comprehensive metabolic panel  Result Value Ref Range   Sodium 140 135 - 145 mEq/L   Potassium 4.9 3.5 - 5.1 mEq/L   Chloride 104 96 - 112 mEq/L   CO2 29 19 - 32 mEq/L   Glucose, Bld 233 (H) 70 - 99 mg/dL   BUN 17 6 - 23 mg/dL   Creatinine, Ser 1.05 0.40 - 1.50 mg/dL   Total Bilirubin 0.7 0.2 - 1.2 mg/dL   Alkaline Phosphatase 54 39 - 117 U/L   AST 17 0 - 37 U/L   ALT 23 0 - 53 U/L   Total Protein 7.0 6.0 - 8.3 g/dL   Albumin 4.3 3.5 - 5.2 g/dL   Calcium 10.1 8.4 - 10.5 mg/dL   GFR 73.67 >60.00 mL/min  Lipid panel  Result Value Ref Range   Cholesterol 122 0 - 200 mg/dL   Triglycerides 247.0 (H) 0.0 - 149.0 mg/dL   HDL 27.90 (L) >39.00 mg/dL   VLDL 49.4 (H) 0.0 - 40.0 mg/dL   Total CHOL/HDL Ratio 4    NonHDL 94.31   LDL cholesterol, direct  Result Value Ref Range   Direct LDL 61.0 mg/dL      Assessment & Plan:   Problem List Items Addressed This Visit    Diabetes mellitus type 2, uncontrolled, with complications ([place]) - Primary    Chronic, improved but still uncontrolled.  Will start januvia 50mg  daily, continue amaryl and metformin. Check abd [place] to eval pancreas (unexplained acute deterioration of sugar control).  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 Desires definitive eval - will refer to [facility]       Relevant Orders   Ambulatory referral to General Surgery   Hyperlipidemia with target LDL less than 70    LDL at goal. Trig improving as sugar control improves.  Continue lipitor.       Severe obesity (BMI 35.0-39.9) with comorbidity ([place])    Congratulated on 5 lb weight loss.       Relevant Medications   sitaGLIPtin (JANUVIA) 50 MG tablet   Smoker    Contemplative. Continue to encourage smoking cessation. Encouraged reconsider lung cancer screening CT.           Meds ordered this encounter  Medications  . sitaGLIPtin (JANUVIA) 50 MG tablet    Sig: Take 1 tablet (50 mg total) by mouth daily.    Dispense:  30 tablet    Refill:  3   Orders Placed This Encounter  Procedures  . US Abdomen Complete    Standing Status:   Future    Standing Expiration Date:   05/21/2019    Order Specific Question:   Reason for Exam (SYMPTOM  OR DIAGNOSIS REQUIRED)    Answer:   uncontrolled diabetes, abd distension    Order Specific Question:   Preferred imaging location?    Answer:   [id number]  . Ambulatory referral to General Surgery    Referral Priority:   Routine    Referral Type:   Surgical    Referral Reason:   Specialty Services Required    Requested Specialty:   General Surgery    Number of Visits Requested:   1    Follow up plan: Return in about 3 months (around 06/20/2018), or if symptoms worsen or fail to improve.  [name], MD

## 2018-03-20 NOTE — Assessment & Plan Note (Signed)
Contemplative. Continue to encourage smoking cessation. Encouraged reconsider lung cancer screening CT.

## 2018-03-20 NOTE — Patient Instructions (Addendum)
We will refer you to general surgeon for evaluation of recurrent skin cyst on neck.  Consider lung CT.  We will check abdominal ultrasound.  Continue metformin and amaryl. Start [place] 50mg  daily sent to pharmacy.  Return in 3 months for follow up visit.

## 2018-03-20 NOTE — Assessment & Plan Note (Signed)
LDL at goal. Trig improving as sugar control improves.  Continue lipitor.

## 2018-03-20 NOTE — Assessment & Plan Note (Addendum)
Chronic, improved but still uncontrolled.  Will start januvia 50mg  daily, continue amaryl and metformin. Check abd [place] to eval pancreas (unexplained acute deterioration of sugar control).  RTC 3 mo f/u visit

## 2018-03-26 ENCOUNTER — Ambulatory Visit: Payer: Self-pay | Admitting: Surgery

## 2018-03-26 DIAGNOSIS — K432 Incisional hernia without obstruction or gangrene: Secondary | ICD-10-CM | POA: Diagnosis not present

## 2018-03-26 DIAGNOSIS — L723 Sebaceous cyst: Secondary | ICD-10-CM | POA: Diagnosis not present

## 2018-03-26 NOTE — H&P (Signed)
[name] Documented: 03/26/2018 9:39 AM Location: [facility] Patient #: [id number] DOB: [birth year] Married / Language: English / Race: White Male  History of Present Illness Marcello Moores A. Quinnlyn Hearns MD; 03/26/2018 10:35 AM) Patient words: Patient sent at the request of Dr. 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 He's had this cyst on off of last year. He got infected last year was drained. It was then excised by dermatology last year. Last month he developed more swelling and pain. The area with the cyst was beginning to swell and drain again consistent with recurrence of the cyst. He denies fever or chills. He is a smoker and does not stop. He denies any redness, drainage, pain or other symptoms today. He has a history of colostomy reversal and has an incisional hernia which is not new from that. It does not cause him any pain.  The patient is a 72 year old male.   Allergies Sabino Gasser; 03/26/2018 9:39 AM) NIACIN Dilaudid *ANALGESICS - OPIOID* Itching. Allergies Reconciled  Medication History Sabino Gasser; 03/26/2018 9:39 AM) Latanoprost (0.005% Solution, Ophthalmic) Active. Glucosamine-Chondroitin-Vit D3 (1500-1200-800MG -MG-UNIT Packet, Oral) Active. Coenzyme Q10 (10MG  Capsule, Oral) Active. Alphagan P (0.1% Solution, Ophthalmic) Active. Aspirin (81MG  Tablet, Oral) Active. Atenolol (50MG  Tablet, Oral) Active. Atorvastatin Calcium (40MG  Tablet, Oral) Active. Clopidogrel Bisulfate (75MG  Tablet, Oral) Active. Furosemide (20MG  Tablet, Oral) Active. Glimepiride (2MG  Tablet, Oral) Active. MetFORMIN HCl (1000MG  Tablet, Oral) Active. Omega-3-acid Ethyl Esters (1GM Capsule, Oral) Active. Medications Reconciled     Review of Systems (Kriti Katayama A. Torien Ramroop MD; 03/26/2018 10:38 AM) All other systems negative  Vitals Sabino Gasser; 03/26/2018 9:41 AM) 03/26/2018 9:40 AM Weight: 238.38 lb Height: 68in Body Surface Area:  2.2 m Body Mass Index: 36.24 kg/m  Temp.: 97.59F(Oral)  Pulse: 56 (Regular)  BP: 130/82 (Sitting, Left Arm, Standard)      Physical Exam (Kaleel Schmieder A. Selso Mannor MD; 03/26/2018 10:35 AM)  General Mental Status-Alert. General Appearance-Consistent with stated age. Hydration-Well hydrated. Voice-Normal.  Integumentary Note: Posterior neck is a 4 cm x 5 cm chronic epidermal inclusion cyst without signs of infection  Chest and Lung Exam Chest and lung exam reveals -quiet, even and easy respiratory effort with no use of accessory muscles and on auscultation, normal breath sounds, no adventitious sounds and normal vocal resonance. Inspection Chest Wall - Normal. Back - normal.  Cardiovascular Cardiovascular examination reveals -on palpation PMI is normal in location and amplitude, no palpable S3 or S4. Normal cardiac borders., normal heart sounds, regular rate and rhythm with no murmurs, carotid auscultation reveals no bruits and normal pedal pulses bilaterally.  Abdomen Note: Scars from surgery noted with incisional hernia which is reducible soft nontender  Neurologic Neurologic evaluation reveals -alert and oriented x 3 with no impairment of recent or remote memory. Mental Status-Normal.  Musculoskeletal Normal Exam - Left-Upper Extremity Strength Normal and Lower Extremity Strength Normal. Normal Exam - Right-Upper Extremity Strength Normal, Lower Extremity Weakness.    Assessment & Plan (Nikolus Marczak A. Madylyn Insco MD; 03/26/2018 10:36 AM)  SEBACEOUS CYST (L72.3) Impression: 4 cm x 4 cm posterior neck pt desires excision  Risk of bleeding, infection, recurrence, cosmetic issue, pain, injury to neighboring structures, exacerbation of underlying medical problems. Strongly encouraged smoking cessation since this will increase his complication rate and increased risk of poor wound healing and subsequent chronic wound and/or chronic wound infection.  Current  Plans Pt Education - [facility] STOP SMOKING! Pt Education - [facility] Free Text Education/Instructions: discussed with patient and provided information.  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 Observe and signs of incarceration straining a laser were given to patient and wife today. He will observe this for now since it is asymptomatic

## 2018-03-27 ENCOUNTER — Ambulatory Visit
Admission: RE | Admit: 2018-03-27 | Discharge: 2018-03-27 | Disposition: A | Discharge status: Home / Self Care | Payer: PPO | Source: Ambulatory Visit | Attending: Family Medicine | Admitting: Family Medicine

## 2018-03-27 DIAGNOSIS — E1165 Type 2 diabetes mellitus with hyperglycemia: Secondary | ICD-10-CM | POA: Diagnosis not present

## 2018-03-27 DIAGNOSIS — R932 Abnormal findings on diagnostic imaging of liver and biliary tract: Secondary | ICD-10-CM | POA: Insufficient documentation

## 2018-03-27 DIAGNOSIS — IMO0002 Reserved for concepts with insufficient information to code with codable children: Secondary | ICD-10-CM

## 2018-03-27 DIAGNOSIS — K8020 Calculus of gallbladder without cholecystitis without obstruction: Secondary | ICD-10-CM | POA: Insufficient documentation

## 2018-03-27 DIAGNOSIS — E118 Type 2 diabetes mellitus with unspecified complications: Secondary | ICD-10-CM | POA: Insufficient documentation

## 2018-03-28 ENCOUNTER — Telehealth: Payer: Self-pay

## 2018-03-28 NOTE — Telephone Encounter (Signed)
   [facility] Pre-operative Risk Assessment    Request for surgical clearance:  1. What type of surgery is being performed? Cyst excision  2. When is this surgery scheduled? TBD  3. What type of clearance is required (medical clearance vs. Pharmacy clearance to hold med vs. Both) Both  4. Are there any medications that need to be held prior to surgery and how long? Plavix and Aspirin  5. Practice name and name of physician performing surgery? [facility]  Dr.Thomas Cornett   6. What is your office phone number [phone]   7.   What is your office fax number [phone]  8.   Anesthesia type (None, local, MAC, general) ? General   Evan Avery 03/28/2018, 9:08 AM  _________________________________________________________________   (provider comments below)

## 2018-03-29 NOTE — Telephone Encounter (Signed)
   Primary Cardiologist :Dr [name]  Chart reviewed as part of pre-operative protocol coverage. Because of SPENCE SOBERANO past medical history and time since last visit, he/she will require a follow-up visit in order to better assess preoperative cardiovascular risk.  Pre-op covering staff: - Please schedule appointment and call patient to inform them. - Please contact requesting surgeon's office via preferred method (i.e, phone, fax) to inform them of need for appointment prior to surgery.  Kerin Ransom, PA-C  03/29/2018, 9:04 AM

## 2018-03-29 NOTE — Telephone Encounter (Signed)
Spoke with pt, Follow up scheduled with dr [name] for surgical clearance.

## 2018-03-30 ENCOUNTER — Encounter: Payer: Self-pay | Admitting: Family Medicine

## 2018-03-30 DIAGNOSIS — K8020 Calculus of gallbladder without cholecystitis without obstruction: Secondary | ICD-10-CM | POA: Insufficient documentation

## 2018-03-30 DIAGNOSIS — K760 Fatty (change of) liver, not elsewhere classified: Secondary | ICD-10-CM | POA: Insufficient documentation

## 2018-04-06 ENCOUNTER — Other Ambulatory Visit: Payer: Self-pay | Admitting: Family Medicine

## 2018-04-11 ENCOUNTER — Encounter: Payer: Self-pay | Admitting: Cardiovascular Disease

## 2018-04-11 ENCOUNTER — Ambulatory Visit (INDEPENDENT_AMBULATORY_CARE_PROVIDER_SITE_OTHER): Payer: PPO | Admitting: Cardiovascular Disease

## 2018-04-11 VITALS — BP 138/74 | HR 57 | Ht 68.0 in | Wt 238.6 lb

## 2018-04-11 DIAGNOSIS — G4733 Obstructive sleep apnea (adult) (pediatric): Secondary | ICD-10-CM

## 2018-04-11 DIAGNOSIS — Z720 Tobacco use: Secondary | ICD-10-CM | POA: Diagnosis not present

## 2018-04-11 DIAGNOSIS — I10 Essential (primary) hypertension: Secondary | ICD-10-CM

## 2018-04-11 DIAGNOSIS — E118 Type 2 diabetes mellitus with unspecified complications: Secondary | ICD-10-CM

## 2018-04-11 DIAGNOSIS — Z9989 Dependence on other enabling machines and devices: Secondary | ICD-10-CM

## 2018-04-11 DIAGNOSIS — E785 Hyperlipidemia, unspecified: Secondary | ICD-10-CM | POA: Diagnosis not present

## 2018-04-11 DIAGNOSIS — I2510 Atherosclerotic heart disease of native coronary artery without angina pectoris: Secondary | ICD-10-CM

## 2018-04-11 NOTE — Patient Instructions (Signed)
Medication Instructions: Your physician recommends that you continue on your current medications as directed. Please refer to the Current Medication list given to you today.  If you need a refill on your cardiac medications before your next appointment, please call your pharmacy.   Procedures/Testing: Your physician has requested that you have a lexiscan myoview. For further information please visit [email or web address]. Please follow instruction sheet, as given. This will take place at [address], [address]   Follow-Up: Your physician wants you to follow-up in 6 months with Dr. [name]. You will receive a reminder letter in the mail two months in advance. If you don't receive a letter, please call our office at [phone] to schedule this follow-up appointment.   Special Instructions:  You may hold Plavix for 5-7 days. If needed, you may hold aspirin for 2 days for your surgery.  Thank you for choosing [facility] at [facility]!!

## 2018-04-11 NOTE — Progress Notes (Signed)
Patient ID: Evan Avery, male   DOB: May 28, 1946, 72 y.o.   MRN: [id number]   Primary M.D.: Dr. Danise Mina HPI: Evan Avery is a 72 y.o. male who presents today for an 8 month cardiology evaluation and preoperative clearance prior to undergoing surgery on the back of his neck..  Evan Avery has known CAD and has a total of 4 stents in his RCA. In 1992, he underwent initial PTCA of his RCA. Subsequently, he underwent stenting of the proximal and mid RCA.  In 1999 his fourth stent was placed in the mid RCA between the 2 previously placed stents and was a 3.5x28 mm non-DES [place] stent. His last nuclear perfusion study in August 2012 showed inferior thinning without significant ischemia.  EF was not calculated secondary to  ectopy but previously had been 52%.  Additional problems include hypertension, hyperlipidemia, obesity, and obstructive sleep apnea for which he is he uses CPAP 100% of the time and uses  [employer] for his [employer].  Since I last saw him in May 2017.  He denied any recurrent episodes of chest pain or significant dyspnea.  He had a new CPAP machine and continue to be compliant.  He established primary care with Dr. Danise Mina.  Review of laboratory that he had with him in December 2016 has shown a cholesterol of 149, HDL 27, but his triglycerides were 413.  Remotely he had triglyceride elevations as high as 584.  In December, his hemoglobin A1c was 7.7.  He underwent surgery in July 2017 due to an abscess in his colon.  He required a colostomy for 6 months and in January 2018 underwent colostomy closure.   I saw him in October 2018 after not having seen him in over 17 months.  Since that evaluation, he has undergone 2 resections for cyst on the back of his neck with recurrent infections.  He recently saw Dr. Brantley Stage consideration of surgical excision.  He has felt well with reference to chest pain or shortness of breath.  He had smoked for many years but quit smoking  for 10 years but unfortunately resumed smoking in June 2018 and currently smoking 1 pack/day.  He has not had a nuclear perfusion study since 2012.  He continues to use CPAP with 100% compliance.  He was recently started on Januvia for his diabetes mellitus by his primary physician.  He now presents for audiology evaluation and potential preoperative clearance.   Past Medical History:  Diagnosis Date  . Agent orange exposure 1968  . Arthritis   . CAD (coronary artery disease)    4 stents in right artery  . Complex tear of medial meniscus of left knee as current injury 07/26/2012   [place]  . Diabetes mellitus    type 2  . Diverticulitis of colon with perforation 05/23/2016  . Glaucoma 2006   on drops, followed by [place]  . Hyperlipidemia   . Hypertension   . Intra-abdominal abscess ([place]) 05/16/2016  . LUMBAR RADICULOPATHY 07/18/2007   Qualifier: Diagnosis of  By: Arnoldo Morale MD, Balinda Quails   . Myocardial infarction [facility])    1992  . Sleep apnea    uses CPAP regularly    Past Surgical History:  Procedure Laterality Date  . APPENDECTOMY  1958  . BOWEL RESECTION N/A 05/23/2016   Procedure: SMALL BOWEL RESECTION;  Surgeon: Jackolyn Confer, MD;  Location: [facility];  Service: General;  Laterality: N/A;  . CARDIAC CATHETERIZATION  08/11/2008   angiographically patent LAD  diagonal, circumflex and multiple circumflex obtuse marginal branches from the L coronary artery; stenting of mid R coronary artery with 3.5 x 45m Liberte non DES postdilated w/ 4.0 DuraStent ([name]  . CARDIOVASCULAR STRESS TEST  06/16/2011   R/P MV - moderate perfusion defect in basal inferoseptal, basal inferior, mid inferseptal and mid inferior regions, consistent with infarct/scar and/or diaphragmatic attenuation; non-gated study secondary to ectopy; no CP or EKG changes for ischemia; abnormal study although no significant changes from previous study; in the absense of gated images cannot calculate EF or distinguish scar/artifact  .  COLONOSCOPY  08/2016   TAx5, sessile serrated polyp, diverticulosis, rpt 2 yrs ([place])  . COLOSTOMY Left 05/23/2016   Procedure: COLOSTOMY;  Surgeon: TJackolyn Confer MD;  Location: [facility];  Service: General;  Laterality: Left;  . COLOSTOMY TAKEDOWN N/A 11/10/2016   Procedure: LAPAROSCOPIC LYSIS OF ADHESIONS AND  COLOSTOMY CLOSURE;  Surgeon: TJackolyn Confer MD;  Location: [facility];  Service: General;  Laterality: N/A;  . DOPPLER ECHOCARDIOGRAPHY  08/20/2008   EF >>25% LV systolic function normal; LV mildly dilated; doppler flow suggestive of impaired LV relaxation; RV mildly dilated, RV systolic function normal, RV systolic pressure normal  . INCISION AND DRAINAGE ABSCESS N/A 05/23/2016   Procedure: DRAINAGE OF MULTIPLE INTRA  ABDOMINAL ABSCESS;  Surgeon: TJackolyn Confer MD;  Location: [facility];  Service: General;  Laterality: N/A;  . KNEE SURGERY Left 2015   torn meniscus ([place]  . LAPAROSCOPIC LYSIS OF ADHESIONS  11/10/2016   Procedure: LAPAROSCOPIC LYSIS OF ADHESIONS;  Surgeon: TJackolyn Confer MD;  Location: [facility];  Service: General;;  . LAPAROTOMY N/A 05/23/2016   Procedure: EXPLORATORY LAPAROTOMY;  Surgeon: TJackolyn Confer MD;  Location: [facility];  Service: General;  Laterality: N/A;  . PERCUTANEOUS CORONARY STENT INTERVENTION (PCI-S)  1992, 1999, 2004   4 vessels ([name]  . PROCTOSCOPY  11/10/2016   Procedure: PROCTOSCOPY;  Surgeon: TJackolyn Confer MD;  [facility]: [facility];  Service: General;;    Allergies  Allergen Reactions  . Dilaudid [Hydromorphone Hcl] Itching  . Ambien [Zolpidem] Other (See Comments)    Pt states "makes me wild"  . Niacin And Related Other (See Comments)    Extreme fatigue    Current Outpatient Medications  Medication Sig Dispense Refill  . aspirin 81 MG tablet Take 81 mg by mouth daily.      .Marland Kitchenatenolol (TENORMIN) 50 MG tablet TAKE 1 & 1/2 (ONE & ONE-HALF) TABLETS BY MOUTH ONCE DAILY 135 tablet 0  . atorvastatin (LIPITOR) 40 MG tablet Take 1 tablet (40 mg  total) by mouth daily at 6 PM. 90 tablet 3  . Brinzolamide-Brimonidine (SIMBRINZA) 1-0.2 % SUSP Apply 1 drop to eye 3 (three) times daily.     . clopidogrel (PLAVIX) 75 MG tablet TAKE 1 TABLET BY MOUTH ONCE DAILY 90 tablet 1  . Coenzyme Q10 200 MG capsule Take 200 mg by mouth daily.    . fluticasone (FLONASE) 50 MCG/ACT nasal spray Place 2 sprays into both nostrils daily. 16 g 1  . glimepiride (AMARYL) 4 MG tablet Take 1 tablet (4 mg total) by mouth daily with breakfast. 90 tablet 1  . ibuprofen (ADVIL,MOTRIN) 200 MG tablet Take 600 mg by mouth every 6 (six) hours as needed for mild pain.    .Marland Kitchenlatanoprost (XALATAN) 0.005 % ophthalmic solution Place 1 drop into both eyes at bedtime.     .Marland Kitchenlisinopril (PRINIVIL,ZESTRIL) 5 MG tablet Take 1 tablet (5 mg total) by mouth daily. [place]  tablet 3  . metFORMIN (GLUCOPHAGE) 1000 MG tablet TAKE 1 TABLET BY MOUTH TWICE DAILY WITH MEALS 180 tablet 3  . omega-3 acid ethyl esters (LOVAZA) 1 g capsule TAKE 2 CAPSULES BY MOUTH TWICE DAILY 360 capsule 2  . senna-docusate (SENOKOT-S) 8.6-50 MG tablet Take 1 tablet by mouth at bedtime as needed for mild constipation. 15 tablet 0  . sitaGLIPtin (JANUVIA) 50 MG tablet Take 1 tablet (50 mg total) by mouth daily. 30 tablet 3   No current facility-administered medications for this visit.     Socially he is married has 2 children 2 grandchildren. He does walk several times a week. He does have a tobacco history. There is no alcohol use.   ROS General: Negative; No fevers, chills, or night sweats; positive for obesity. HEENT: Negative; No changes in vision or hearing, sinus congestion, difficulty swallowing Pulmonary: Negative; No cough, wheezing, shortness of breath, hemoptysis Cardiovascular: Negative; No chest pain, presyncope, syncope, palpatations GI: Colonic abscess requiring colostomy, and ultimate closure GU: Negative; No dysuria, hematuria, or difficulty voiding Musculoskeletal: He is status post left knee  surgery; no myalgias, joint pain, or weakness Hematologic/Oncology: Negative; no easy bruising, bleeding Endocrine: Negative; no heat/cold intolerance; no diabetes Neuro: Negative; no changes in balance, headaches Skin: Recurrent cysts in the back of his neck Psychiatric: Negative; No behavioral problems, depression Sleep: Positive for sleep apnea, on CPAP with 100% compliance. No snoring, daytime sleepiness, hypersomnolence, bruxism, restless legs, hypnogognic hallucinations, no cataplexy Other comprehensive 14 point system review is negative.   PE BP 138/74   Pulse (!) 57   Ht 5' 8" (1.727 m)   Wt 238 lb 9.6 oz (108.2 kg)   BMI 36.28 kg/m    Repeat blood pressure by me was 140/72.  Wt Readings from Last 3 Encounters:  04/11/18 238 lb 9.6 oz (108.2 kg)  03/20/18 240 lb 8 oz (109.1 kg)  12/18/17 245 lb (111.1 kg)   General: Alert, oriented, no distress.  Skin: normal turgor, no rashes, warm and dry HEENT: Normocephalic, atraumatic. Pupils equal round and reactive to light; sclera anicteric; extraocular muscles intact;  Nose without nasal septal hypertrophy Mouth/Parynx benign; Mallinpatti scale 3/4 Neck: Residual cyst of the back of his neck;no JVD, no carotid bruits; normal carotid upstroke Lungs: clear to ausculatation and percussion; no wheezing or rales Chest wall: without tenderness to palpitation Heart: PMI not displaced, RRR, s1 s2 normal, 1/6 systolic murmur, no diastolic murmur, no rubs, gallops, thrills, or heaves Abdomen: Moderate central obesity.  Soft, nontender; no hepatosplenomehaly, BS+; abdominal aorta nontender and not dilated by palpation. Back: no CVA tenderness Pulses 2+ Musculoskeletal: full range of motion, normal strength, no joint deformities Extremities: no clubbing cyanosis or edema, Homan's sign negative  Neurologic: grossly nonfocal; Cranial nerves grossly wnl Psychologic: Normal mood and affect    ECG (independently read by me): Sinus  bradycardia 57 bpm.  No ectopy.  Normal intervals.  October 2018 ECG (independently read by me): Normal sinus rhythm with PAC.  PR interval 168 ms.  QTc interval 454 ms.  May 2017 ECG (independently read by me): Sinus bradycardia 52 bpm.  T-wave abnormality in leads 3 and aVF.  May 2016 ECG (independently read by me): Sinus bradycardia 53 bpm.  Nondiagnostic T changes with T-wave inversion in lead 3.  June 2015 ECG (independently read by me):  sinus bradycardia at 55 QTc interval 447 ms.  PR interval 174 ms.  Nondiagnostic T changes in lead 3.  ECG: Sinus rhythm at 56  beats per minute. No ectopy. Normal intervals; nonspecific T changes in lead 3.  LABS: BMP Latest Ref Rng & Units 03/14/2018 12/14/2017 12/06/2016  Glucose 70 - 99 mg/dL 233(H) 281(H) 175(H)  BUN 6 - 23 mg/dL _0 Creatinine 0.40 - 1.50 mg/dL 1.05 1.05 1.11  Sodium 135 - 145 mEq/L 140 139 142  Potassium 3.5 - 5.1 mEq/L 4.9 5.2(H) 4.5  Chloride 96 - 112 mEq/L 104 104 107  CO2 19 - 32 mEq/L _1 Calcium 8.4 - 10.5 mg/dL 10.1 9.8 9.7   Hepatic Function Latest Ref Rng & Units 03/14/2018 12/14/2017 12/06/2016  Total Protein 6.0 - 8.3 g/dL 7.0 6.7 6.6  Albumin 3.5 - 5.2 g/dL 4.3 4.0 4.4  AST 0 - 37 U/L _2 ALT 0 - 53 U/L _3 Alk Phosphatase 39 - 117 U/L 54 66 43  Total Bilirubin 0.2 - 1.2 mg/dL 0.7 0.6 0.4  Bilirubin, Direct 0.0 - 0.3 mg/dL - - -    CBC Latest Ref Rng & Units 03/23/2017 11/11/2016 11/07/2016  WBC 4.0 - 10.5 K/uL 5.3 9.7 7.4  Hemoglobin 13.0 - 17.0 g/dL 12.9(L) 10.8(L) 12.4(L)  Hematocrit 39.0 - 52.0 % 38.0(L) 32.0(L) 36.2(L)  Platelets 150.0 - 400.0 K/uL 153.0 146(L) 170   Lab Results  Component Value Date   TSH 1.113 05/16/2016   Lab Results  Component Value Date   HGBA1C 8.9 (H) 03/14/2018   Lipid Panel     Component Value Date/Time   CHOL 122 03/14/2018 0805   CHOL 149 03/13/2016 0803   TRIG 247.0 (H) 03/14/2018 0805   TRIG 387 (H) 03/13/2016 0803   HDL 27.90 (L) 03/14/2018  0805   HDL 26 (L) 03/13/2016 0803   CHOLHDL 4 03/14/2018 0805   VLDL 49.4 (H) 03/14/2018 0805   LDLCALC 69 12/06/2016 0825   LDLCALC 46 03/13/2016 0803   LDLDIRECT 61.0 03/14/2018 0805    RADIOLOGY: No results found.  IMPRESSION:  1. CAD in native artery   2. Essential hypertension, benign   3. Hyperlipidemia with target LDL less than 70   4. OSA on CPAP   5. Tobacco abuse   6. Morbid obesity ([place])   7. Type 2 diabetes mellitus with complication, without long-term current use of insulin ([place])      ASSESSMENT AND PLAN: Evan Avery  is a 72 year old Caucasian male who has CAD and underwent initial PTCA to his RCA in 1992.  His last catheterization was in October 2009 when his fourth stent was placed in his RCA.  He has continued to be free of recurrent anginal symptomatology.  He has a history of hypertension.  When I last saw him his blood pressure was elevated and I titrated lisinopril to 5 mg daily.  He continues to be on atenolol 75 mg.  Blood pressure today was 138/74.  He continues to be on dual antiplatelet therapy with aspirin and Plavix with his multiple stents.  He has type 2 diabetes mellitus on glimepiride in addition to metformin and recently Januvia was initiated.  Discussed the importance of smoking cessation.  Unfortunately he resumed smoking after not smoking for 10 years and is currently smoking 1 pack/day.  He has obstructive sleep apnea and cannot sleep without CPAP.  He has 100% compliance.  He has had recurrence of cyst in the back which I suspect may be sebaceous cyst and has had recurrent infection.  He was recently evaluated by Dr. Brantley Stage is  under consideration for open surgical resection.  Clinically, Evan Avery is remaining asymptomatic without chest pain or ischemic symptoms.  He denies any exertional dyspnea.  Since this procedure is elective, I have recommended that he undergo a [place] study his last nuclear stress test was in 2012 further evaluate  potential ischemic heart disease.  He will need to hold Plavix for 5 days prior to his surgical procedure.  If his surgery will be delayed, I will try to obtain the nuclear perfusion study preoperatively.  However, if surgery is necessary prior to obtaining the nuclear study, his clinical status appears stable for this undertaking.  Continue to be on atorvastatin 40 mg for hyperlipidemia target LDL less than 70.  Reviewed recent laboratory by Dr. Danise Mina.  He has an atherogenic dyslipidemic profile with elevation triglyceride, low HDL, and increased VLDL.  Triglycerides remain elevated but are improved at 247 from 616 on lovaza a 2 capsules twice a day.  We will see him in 6 months for follow-up evaluation or sooner if his nuclear perfusion study just increased risk.  Time spent: 25 minutes  Troy Sine, MD, [facility]  04/12/2018 2:47 PM

## 2018-04-12 ENCOUNTER — Encounter: Payer: Self-pay | Admitting: Cardiovascular Disease

## 2018-04-18 ENCOUNTER — Telehealth (HOSPITAL_COMMUNITY): Payer: Self-pay

## 2018-04-18 NOTE — Telephone Encounter (Signed)
Encounter complete. 

## 2018-04-22 HISTORY — PX: CYST EXCISION: SHX5701

## 2018-04-23 ENCOUNTER — Ambulatory Visit (HOSPITAL_COMMUNITY)
Admission: RE | Admit: 2018-04-23 | Discharge: 2018-04-23 | Disposition: A | Discharge status: Home / Self Care | Payer: PPO | Source: Ambulatory Visit | Attending: Cardiovascular Disease | Admitting: Cardiovascular Disease

## 2018-04-23 ENCOUNTER — Telehealth: Payer: Self-pay | Admitting: *Deleted

## 2018-04-23 DIAGNOSIS — I2510 Atherosclerotic heart disease of native coronary artery without angina pectoris: Secondary | ICD-10-CM | POA: Diagnosis not present

## 2018-04-23 DIAGNOSIS — I252 Old myocardial infarction: Secondary | ICD-10-CM | POA: Insufficient documentation

## 2018-04-23 DIAGNOSIS — I10 Essential (primary) hypertension: Secondary | ICD-10-CM | POA: Insufficient documentation

## 2018-04-23 DIAGNOSIS — Z9861 Coronary angioplasty status: Secondary | ICD-10-CM | POA: Diagnosis not present

## 2018-04-23 DIAGNOSIS — Z8249 Family history of ischemic heart disease and other diseases of the circulatory system: Secondary | ICD-10-CM | POA: Insufficient documentation

## 2018-04-23 DIAGNOSIS — F17200 Nicotine dependence, unspecified, uncomplicated: Secondary | ICD-10-CM | POA: Diagnosis not present

## 2018-04-23 DIAGNOSIS — Z6836 Body mass index (BMI) 36.0-36.9, adult: Secondary | ICD-10-CM | POA: Insufficient documentation

## 2018-04-23 DIAGNOSIS — E119 Type 2 diabetes mellitus without complications: Secondary | ICD-10-CM | POA: Insufficient documentation

## 2018-04-23 DIAGNOSIS — H409 Unspecified glaucoma: Secondary | ICD-10-CM | POA: Insufficient documentation

## 2018-04-23 DIAGNOSIS — E669 Obesity, unspecified: Secondary | ICD-10-CM | POA: Diagnosis not present

## 2018-04-23 DIAGNOSIS — G4733 Obstructive sleep apnea (adult) (pediatric): Secondary | ICD-10-CM | POA: Diagnosis not present

## 2018-04-23 LAB — MYOCARDIAL PERFUSION IMAGING
CHL CUP NUCLEAR SSS: 5
CSEPPHR: 75 {beats}/min
LV sys vol: 82 mL
LVDIAVOL: 164 mL (ref 62–150)
Rest HR: 45 {beats}/min
SDS: 5
SRS: 0
TID: 1.28

## 2018-04-23 MED ORDER — TECHNETIUM TC 99M TETROFOSMIN IV KIT
26.0000 | PACK | Freq: Once | INTRAVENOUS | Status: AC | PRN
  Administered 2018-04-23: 26 via INTRAVENOUS
  Filled 2018-04-23: qty 26

## 2018-04-23 MED ORDER — REGADENOSON 0.4 MG/5ML IV SOLN
0.4000 mg | Freq: Once | INTRAVENOUS | Status: AC
  Administered 2018-04-23: 0.4 mg via INTRAVENOUS

## 2018-04-23 MED ORDER — TECHNETIUM TC 99M TETROFOSMIN IV KIT
8.3000 | PACK | Freq: Once | INTRAVENOUS | Status: AC | PRN
  Administered 2018-04-23: 8.3 via INTRAVENOUS
  Filled 2018-04-23: qty 9

## 2018-04-23 NOTE — Telephone Encounter (Signed)
Okay for surgery, hold Plavix for least 5 days prior to procedure

## 2018-04-23 NOTE — Telephone Encounter (Signed)
"  Since this procedure is elective, I have recommended that he undergo a [place] study his last nuclear stress test was in 2012 further evaluate potential ischemic heart disease. He will need to hold Plavix for 5 days prior to his surgical procedure. If his surgery will be delayed, I will try to obtain the nuclear perfusion study preoperatively. However, if surgery is necessary prior to obtaining the nuclear study, his clinical status appears stable for this undertaking. "      Routed to Dr. [name] to address surgical clearance based on myoview results

## 2018-04-23 NOTE — Telephone Encounter (Signed)
-----   Message from Troy Sine, MD sent at 04/23/2018  4:29 PM EDT ----- Small inferior defect, low risk.  EF 50% with basal inferior hypokinesis.

## 2018-04-24 NOTE — Telephone Encounter (Signed)
Left message to call back  

## 2018-05-01 NOTE — Telephone Encounter (Signed)
Spoke to patient, aware of results and verbalized understanding.   Surgery is scheduled for 7/31

## 2018-05-14 ENCOUNTER — Encounter (HOSPITAL_BASED_OUTPATIENT_CLINIC_OR_DEPARTMENT_OTHER): Payer: Self-pay | Admitting: *Deleted

## 2018-05-14 ENCOUNTER — Other Ambulatory Visit: Payer: Self-pay

## 2018-05-15 ENCOUNTER — Encounter (HOSPITAL_BASED_OUTPATIENT_CLINIC_OR_DEPARTMENT_OTHER)
Admission: RE | Admit: 2018-05-15 | Discharge: 2018-05-15 | Disposition: A | Discharge status: Home / Self Care | Payer: PPO | Source: Ambulatory Visit | Attending: Surgery | Admitting: Surgery

## 2018-05-15 DIAGNOSIS — Z01812 Encounter for preprocedural laboratory examination: Secondary | ICD-10-CM | POA: Diagnosis not present

## 2018-05-15 LAB — BASIC METABOLIC PANEL
Anion gap: 9 (ref 5–15)
BUN: 20 mg/dL (ref 8–23)
CALCIUM: 9.8 mg/dL (ref 8.9–10.3)
CO2: 27 mmol/L (ref 22–32)
Chloride: 105 mmol/L (ref 98–111)
Creatinine, Ser: 1.07 mg/dL (ref 0.61–1.24)
Glucose, Bld: 228 mg/dL — ABNORMAL HIGH (ref 70–99)
Potassium: 4.9 mmol/L (ref 3.5–5.1)
Sodium: 141 mmol/L (ref 135–145)

## 2018-05-15 NOTE — Progress Notes (Signed)
Ensure pre surgery drink given with instructions to complete by 0530 dos, surgical soap given with instructions, pt verbalized understanding. 

## 2018-05-22 ENCOUNTER — Other Ambulatory Visit: Payer: Self-pay

## 2018-05-22 ENCOUNTER — Encounter (HOSPITAL_BASED_OUTPATIENT_CLINIC_OR_DEPARTMENT_OTHER)
Admission: RE | Disposition: A | Discharge status: Home / Self Care | Payer: Self-pay | Source: Ambulatory Visit | Attending: Surgery

## 2018-05-22 ENCOUNTER — Ambulatory Visit (HOSPITAL_BASED_OUTPATIENT_CLINIC_OR_DEPARTMENT_OTHER): Payer: PPO | Admitting: Anesthesiology

## 2018-05-22 ENCOUNTER — Ambulatory Visit (HOSPITAL_BASED_OUTPATIENT_CLINIC_OR_DEPARTMENT_OTHER)
Admission: RE | Admit: 2018-05-22 | Discharge: 2018-05-22 | Disposition: A | Discharge status: Home / Self Care | Payer: PPO | Source: Ambulatory Visit | Attending: Surgery | Admitting: Surgery

## 2018-05-22 ENCOUNTER — Encounter (HOSPITAL_BASED_OUTPATIENT_CLINIC_OR_DEPARTMENT_OTHER): Payer: Self-pay | Admitting: Anesthesiology

## 2018-05-22 DIAGNOSIS — Z79899 Other long term (current) drug therapy: Secondary | ICD-10-CM | POA: Diagnosis not present

## 2018-05-22 DIAGNOSIS — I10 Essential (primary) hypertension: Secondary | ICD-10-CM | POA: Diagnosis not present

## 2018-05-22 DIAGNOSIS — I2510 Atherosclerotic heart disease of native coronary artery without angina pectoris: Secondary | ICD-10-CM | POA: Insufficient documentation

## 2018-05-22 DIAGNOSIS — E119 Type 2 diabetes mellitus without complications: Secondary | ICD-10-CM | POA: Insufficient documentation

## 2018-05-22 DIAGNOSIS — G4730 Sleep apnea, unspecified: Secondary | ICD-10-CM | POA: Insufficient documentation

## 2018-05-22 DIAGNOSIS — I252 Old myocardial infarction: Secondary | ICD-10-CM | POA: Diagnosis not present

## 2018-05-22 DIAGNOSIS — Z7982 Long term (current) use of aspirin: Secondary | ICD-10-CM | POA: Insufficient documentation

## 2018-05-22 DIAGNOSIS — Z7984 Long term (current) use of oral hypoglycemic drugs: Secondary | ICD-10-CM | POA: Diagnosis not present

## 2018-05-22 DIAGNOSIS — F17210 Nicotine dependence, cigarettes, uncomplicated: Secondary | ICD-10-CM | POA: Insufficient documentation

## 2018-05-22 DIAGNOSIS — L723 Sebaceous cyst: Secondary | ICD-10-CM | POA: Insufficient documentation

## 2018-05-22 DIAGNOSIS — L729 Follicular cyst of the skin and subcutaneous tissue, unspecified: Secondary | ICD-10-CM | POA: Diagnosis not present

## 2018-05-22 DIAGNOSIS — L720 Epidermal cyst: Secondary | ICD-10-CM | POA: Diagnosis not present

## 2018-05-22 HISTORY — PX: MASS EXCISION: SHX2000

## 2018-05-22 LAB — GLUCOSE, CAPILLARY
GLUCOSE-CAPILLARY: 347 mg/dL — AB (ref 70–99)
GLUCOSE-CAPILLARY: 243 mg/dL — AB (ref 70–99)

## 2018-05-22 SURGERY — EXCISION MASS
Anesthesia: General | Site: Neck

## 2018-05-22 MED ORDER — BUPIVACAINE-EPINEPHRINE 0.25% -1:200000 IJ SOLN
INTRAMUSCULAR | Status: DC | PRN
  Administered 2018-05-22: 10 mL

## 2018-05-22 MED ORDER — PROMETHAZINE HCL 25 MG/ML IJ SOLN: 6.2500 mg | INTRAMUSCULAR | Status: DC | PRN

## 2018-05-22 MED ORDER — INSULIN ASPART 100 UNIT/ML ~~LOC~~ SOLN
SUBCUTANEOUS | Status: AC
  Filled 2018-05-22: qty 1

## 2018-05-22 MED ORDER — DEXAMETHASONE SODIUM PHOSPHATE 10 MG/ML IJ SOLN
INTRAMUSCULAR | Status: AC
  Filled 2018-05-22: qty 1

## 2018-05-22 MED ORDER — OXYCODONE HCL 5 MG PO TABS: 5.0000 mg | ORAL_TABLET | Freq: Once | ORAL | Status: DC | PRN

## 2018-05-22 MED ORDER — CHLORHEXIDINE GLUCONATE CLOTH 2 % EX PADS: 6.0000 | MEDICATED_PAD | Freq: Once | CUTANEOUS | Status: DC

## 2018-05-22 MED ORDER — ACETAMINOPHEN 500 MG PO TABS
1000.0000 mg | ORAL_TABLET | ORAL | Status: AC
  Administered 2018-05-22: 1000 mg via ORAL

## 2018-05-22 MED ORDER — PROPOFOL 500 MG/50ML IV EMUL
INTRAVENOUS | Status: AC
  Filled 2018-05-22: qty 50

## 2018-05-22 MED ORDER — MIDAZOLAM HCL 2 MG/2ML IJ SOLN
INTRAMUSCULAR | Status: AC
  Filled 2018-05-22: qty 2

## 2018-05-22 MED ORDER — GABAPENTIN 300 MG PO CAPS
ORAL_CAPSULE | ORAL | Status: AC
Start: 2018-05-22 — End: ?
  Filled 2018-05-22: qty 1

## 2018-05-22 MED ORDER — FENTANYL CITRATE (PF) 100 MCG/2ML IJ SOLN
INTRAMUSCULAR | Status: AC
  Filled 2018-05-22: qty 2

## 2018-05-22 MED ORDER — CELECOXIB 200 MG PO CAPS
ORAL_CAPSULE | ORAL | Status: AC
  Filled 2018-05-22: qty 1

## 2018-05-22 MED ORDER — GABAPENTIN 300 MG PO CAPS
300.0000 mg | ORAL_CAPSULE | ORAL | Status: AC
  Administered 2018-05-22: 300 mg via ORAL

## 2018-05-22 MED ORDER — LACTATED RINGERS IV SOLN
INTRAVENOUS | Status: DC
  Administered 2018-05-22 (×2): via INTRAVENOUS

## 2018-05-22 MED ORDER — LIDOCAINE 2% (20 MG/ML) 5 ML SYRINGE
INTRAMUSCULAR | Status: DC | PRN
  Administered 2018-05-22: 100 mg via INTRAVENOUS

## 2018-05-22 MED ORDER — CELECOXIB 200 MG PO CAPS
200.0000 mg | ORAL_CAPSULE | ORAL | Status: AC
  Administered 2018-05-22: 200 mg via ORAL

## 2018-05-22 MED ORDER — DEXTROSE 5 % IV SOLN
3.0000 g | INTRAVENOUS | Status: AC
  Administered 2018-05-22: 2 g via INTRAVENOUS

## 2018-05-22 MED ORDER — IBUPROFEN 800 MG PO TABS: 800.0000 mg | ORAL_TABLET | Freq: Three times a day (TID) | ORAL | 0 refills | Status: DC | PRN

## 2018-05-22 MED ORDER — GLYCOPYRROLATE 0.2 MG/ML IJ SOLN
INTRAMUSCULAR | Status: DC | PRN
  Administered 2018-05-22: 0.2 mg via INTRAVENOUS

## 2018-05-22 MED ORDER — MIDAZOLAM HCL 2 MG/2ML IJ SOLN: 1.0000 mg | INTRAMUSCULAR | Status: DC | PRN

## 2018-05-22 MED ORDER — FENTANYL CITRATE (PF) 100 MCG/2ML IJ SOLN: 25.0000 ug | INTRAMUSCULAR | Status: DC | PRN

## 2018-05-22 MED ORDER — EPHEDRINE SULFATE 50 MG/ML IJ SOLN
INTRAMUSCULAR | Status: DC | PRN
  Administered 2018-05-22 (×2): 10 mg via INTRAVENOUS

## 2018-05-22 MED ORDER — SCOPOLAMINE 1 MG/3DAYS TD PT72: 1.0000 | MEDICATED_PATCH | Freq: Once | TRANSDERMAL | Status: DC | PRN

## 2018-05-22 MED ORDER — ROCURONIUM BROMIDE 100 MG/10ML IV SOLN
INTRAVENOUS | Status: DC | PRN
  Administered 2018-05-22: 30 mg via INTRAVENOUS

## 2018-05-22 MED ORDER — PHENYLEPHRINE HCL 10 MG/ML IJ SOLN
INTRAMUSCULAR | Status: DC | PRN
  Administered 2018-05-22 (×2): 80 ug via INTRAVENOUS

## 2018-05-22 MED ORDER — PROPOFOL 10 MG/ML IV BOLUS
INTRAVENOUS | Status: DC | PRN
  Administered 2018-05-22: 150 mg via INTRAVENOUS

## 2018-05-22 MED ORDER — ACETAMINOPHEN 500 MG PO TABS
ORAL_TABLET | ORAL | Status: AC
  Filled 2018-05-22: qty 2

## 2018-05-22 MED ORDER — SUCCINYLCHOLINE CHLORIDE 20 MG/ML IJ SOLN
INTRAMUSCULAR | Status: DC | PRN
  Administered 2018-05-22: 100 mg via INTRAVENOUS

## 2018-05-22 MED ORDER — INSULIN REGULAR HUMAN 100 UNIT/ML IJ SOLN: 10.0000 [IU] | Freq: Once | INTRAMUSCULAR | Status: DC

## 2018-05-22 MED ORDER — ONDANSETRON HCL 4 MG/2ML IJ SOLN
INTRAMUSCULAR | Status: DC | PRN
  Administered 2018-05-22: 4 mg via INTRAVENOUS

## 2018-05-22 MED ORDER — INSULIN ASPART 100 UNIT/ML ~~LOC~~ SOLN
10.0000 [IU] | Freq: Once | SUBCUTANEOUS | Status: AC
  Administered 2018-05-22: 10 [IU] via SUBCUTANEOUS

## 2018-05-22 MED ORDER — OXYCODONE HCL 5 MG/5ML PO SOLN: 5.0000 mg | Freq: Once | ORAL | Status: DC | PRN

## 2018-05-22 MED ORDER — ONDANSETRON HCL 4 MG/2ML IJ SOLN
INTRAMUSCULAR | Status: AC
  Filled 2018-05-22: qty 2

## 2018-05-22 MED ORDER — FENTANYL CITRATE (PF) 100 MCG/2ML IJ SOLN
50.0000 ug | INTRAMUSCULAR | Status: DC | PRN
  Administered 2018-05-22: 100 ug via INTRAVENOUS

## 2018-05-22 MED ORDER — CEFAZOLIN SODIUM-DEXTROSE 2-4 GM/100ML-% IV SOLN
INTRAVENOUS | Status: AC
  Filled 2018-05-22: qty 100

## 2018-05-22 MED ORDER — SUGAMMADEX SODIUM 200 MG/2ML IV SOLN
INTRAVENOUS | Status: DC | PRN
  Administered 2018-05-22: 500 mg via INTRAVENOUS

## 2018-05-22 SURGICAL SUPPLY — 41 items
BENZOIN TINCTURE PRP APPL 2/3 (GAUZE/BANDAGES/DRESSINGS) IMPLANT
BLADE SURG 10 STRL SS (BLADE) IMPLANT
BLADE SURG 15 STRL LF DISP TIS (BLADE) ×1 IMPLANT
BLADE SURG 15 STRL SS (BLADE) ×2
CANISTER SUCT 1200ML W/VALVE (MISCELLANEOUS) IMPLANT
CHLORAPREP W/TINT 26ML (MISCELLANEOUS) ×3 IMPLANT
CLOSURE WOUND 1/2 X4 (GAUZE/BANDAGES/DRESSINGS)
COVER BACK TABLE 60X90IN (DRAPES) ×3 IMPLANT
COVER MAYO STAND STRL (DRAPES) ×3 IMPLANT
DECANTER SPIKE VIAL GLASS SM (MISCELLANEOUS) IMPLANT
DERMABOND ADVANCED (GAUZE/BANDAGES/DRESSINGS)
DERMABOND ADVANCED .7 DNX12 (GAUZE/BANDAGES/DRESSINGS) IMPLANT
DRAIN PENROSE 1/4X12 LTX STRL (WOUND CARE) ×3 IMPLANT
DRAPE LAPAROTOMY 100X72 PEDS (DRAPES) ×3 IMPLANT
DRAPE UTILITY XL STRL (DRAPES) ×3 IMPLANT
ELECT COATED BLADE 2.86 ST (ELECTRODE) ×3 IMPLANT
ELECT REM PT RETURN 9FT ADLT (ELECTROSURGICAL) ×3
ELECTRODE REM PT RTRN 9FT ADLT (ELECTROSURGICAL) ×1 IMPLANT
GLOVE BIOGEL PI IND STRL 8 (GLOVE) ×1 IMPLANT
GLOVE BIOGEL PI INDICATOR 8 (GLOVE) ×2
GLOVE ECLIPSE 8.0 STRL XLNG CF (GLOVE) ×3 IMPLANT
GOWN STRL REUS W/ TWL LRG LVL3 (GOWN DISPOSABLE) ×2 IMPLANT
GOWN STRL REUS W/TWL LRG LVL3 (GOWN DISPOSABLE) ×4
NEEDLE HYPO 25X1 1.5 SAFETY (NEEDLE) ×3 IMPLANT
NS IRRIG 1000ML POUR BTL (IV SOLUTION) IMPLANT
PACK BASIN DAY SURGERY FS (CUSTOM PROCEDURE TRAY) ×3 IMPLANT
PENCIL BUTTON HOLSTER BLD 10FT (ELECTRODE) ×3 IMPLANT
SLEEVE SCD COMPRESS KNEE MED (MISCELLANEOUS) ×3 IMPLANT
SPONGE LAP 4X18 RFD (DISPOSABLE) IMPLANT
STAPLER VISISTAT 35W (STAPLE) IMPLANT
STRIP CLOSURE SKIN 1/2X4 (GAUZE/BANDAGES/DRESSINGS) IMPLANT
SUT ETHILON 2 0 FS 18 (SUTURE) ×3 IMPLANT
SUT MON AB 4-0 PC3 18 (SUTURE) ×3 IMPLANT
SUT VICRYL 3-0 CR8 SH (SUTURE) ×3 IMPLANT
SUT VICRYL AB 3 0 TIES (SUTURE) IMPLANT
SYR CONTROL 10ML LL (SYRINGE) ×3 IMPLANT
TOWEL GREEN STERILE FF (TOWEL DISPOSABLE) ×6 IMPLANT
TOWEL OR NON WOVEN STRL DISP B (DISPOSABLE) ×3 IMPLANT
TUBE CONNECTING 20'X1/4 (TUBING)
TUBE CONNECTING 20X1/4 (TUBING) IMPLANT
YANKAUER SUCT BULB TIP NO VENT (SUCTIONS) IMPLANT

## 2018-05-22 NOTE — Op Note (Signed)
Preoperative diagnosis: Sebaceous cyst posterior neck 5 cm x 5 cm subcutaneous  Postoperative diagnosis: Same  Procedure: Excision of sebaceous cyst posterior neck  Surgeon: Erroll Luna, MD  Anesthesia: General with local  EBL: 10 cc  Specimen: Cyst lining to pathology  Drains: Quarter-inch Penrose drain  IV fluids: Per anesthesia record  Indications for procedure: The patient is a 72 year old male with a persistent painful sebaceous cyst on his posterior neck.  He desired excision.The procedure has been discussed with the patient.  Alternative therapies have been discussed with the patient.  Operative risks include bleeding,  Infection,  Organ injury,  Nerve injury,  Blood vessel injury,  DVT,  Pulmonary embolism,  Death,  And possible reoperation.  Medical management risks include worsening of present situation.  The success of the procedure is 50 -90 % at treating patients symptoms.  The patient understands and agrees to proceed.   Description of procedure: The patient was met in the holding area.  The area was marked with the assistance of the patient.  Questions were answered.  He was taken back to the operative room.  He was placed supine and general anesthesia initiated.  He was then rolled onto his right side exposing the cyst on his posterior neck.  This was prepped and draped in sterile fashion and timeout was done.  Local anesthetic consisting of 0.25% Sensorcaine was infiltrated around the cyst.  Incision made in the cyst was identified.  There is copious amounts of sebum but that was not infected.  The cyst lining was excised.  Hemostasis achieved.  He has very poor glycemic control therefore like to place a Penrose drain for postoperative care.  This was done and secured with 2-0 nylon.  The skin was approximated with 4-0 Monocryl.  Hemostasis achieved.  All final counts found to be correct.  Dry bulky dressing placed.  The patient was then placed supine extubated taken to  recovery in satisfactory condition.

## 2018-05-22 NOTE — Transfer of Care (Signed)
Immediate Anesthesia Transfer of Care Note  Patient: Evan Avery  Procedure(s) Performed: EXCISION OF CYST ON POSTERIOR NECK ERAS PATHWAY (N/A Neck)  Patient Location: PACU  Anesthesia Type:General  Level of Consciousness: sedated  Airway & Oxygen Therapy: Patient Spontanous Breathing and Patient connected to face mask oxygen  Post-op Assessment: Report given to RN and Post -op Vital signs reviewed and stable  Post vital signs: Reviewed and stable  Last Vitals:  Vitals Value Taken Time  BP    Temp    Pulse 74 05/22/2018  9:23 AM  Resp 11 05/22/2018  9:23 AM  SpO2 99 % 05/22/2018  9:23 AM  Vitals shown include unvalidated device data.  Last Pain:  Vitals:   05/22/18 0747  TempSrc: Oral  PainSc: 0-No pain         Complications: No apparent anesthesia complications

## 2018-05-22 NOTE — Interval H&P Note (Signed)
History and Physical Interval Note:  05/22/2018 8:34 AM  Evan Avery  has presented today for surgery, with the diagnosis of posterior neck cyst  The various methods of treatment have been discussed with the patient and family. After consideration of risks, benefits and other options for treatment, the patient has consented to  Procedure(s): EXCISION OF CYST ON [place] (N/A) as a surgical intervention .  The patient's history has been reviewed, patient examined, no change in status, stable for surgery.  I have reviewed the patient's chart and labs.  Questions were answered to the patient's satisfaction.     [place]

## 2018-05-22 NOTE — Anesthesia Preprocedure Evaluation (Signed)
Anesthesia Evaluation  Patient identified by MRN, date of birth, ID band Patient awake    Reviewed: Allergy & Precautions, NPO status , Patient's Chart, lab work & pertinent test results  History of Anesthesia Complications Negative for: history of anesthetic complications  Airway Mallampati: II  TM Distance: >3 FB Neck ROM: Full  Mouth opening: Limited Mouth Opening  Dental  (+) Teeth Intact   Pulmonary sleep apnea , Current Smoker, former smoker,    breath sounds clear to auscultation       Cardiovascular hypertension, + CAD and + Past MI   Rhythm:Regular Rate:Normal     Neuro/Psych    GI/Hepatic Neg liver ROS, Per surgical hx   Endo/Other  diabetes  Renal/GU      Musculoskeletal  (+) Arthritis ,   Abdominal   Peds  Hematology   Anesthesia Other Findings   Reproductive/Obstetrics                             Anesthesia Physical  Anesthesia Plan  ASA: III  Anesthesia Plan: General   Post-op Pain Management:    Induction: Intravenous  PONV Risk Score and Plan: 1 and Ondansetron  Airway Management Planned: Oral ETT  Additional Equipment:   Intra-op Plan:   Post-operative Plan: Extubation in OR  Informed Consent:   Plan Discussed with:   Anesthesia Plan Comments:         Anesthesia Quick Evaluation

## 2018-05-22 NOTE — Anesthesia Procedure Notes (Signed)
Procedure Name: Intubation Date/Time: 05/22/2018 8:38 AM Performed by: Maryella Shivers, CRNA Pre-anesthesia Checklist: Patient identified, Emergency Drugs available, Suction available and Patient being monitored Patient Re-evaluated:Patient Re-evaluated prior to induction Oxygen Delivery Method: Circle system utilized Preoxygenation: Pre-oxygenation with 100% oxygen Induction Type: IV induction Ventilation: Mask ventilation without difficulty Laryngoscope Size: Mac and 3 Grade View: Grade III Tube type: Oral Tube size: 8.0 mm Number of attempts: 1 Airway Equipment and Method: Stylet and Oral airway Placement Confirmation: ETT inserted through vocal cords under direct vision,  positive ETCO2 and breath sounds checked- equal and bilateral Secured at: 21 cm Tube secured with: Tape Dental Injury: Teeth and Oropharynx as per pre-operative assessment

## 2018-05-22 NOTE — Discharge Instructions (Signed)
Change dressing daily  Expect drainage and this will be yellow red in color  If it soaks more than a dresing in an hour call office  A drain is in place  It will stay in for 2 weeks  Ok to shower    Tylenol 1,000mg  given today at [place] Instructions  Activity: Get plenty of rest for the remainder of the day. A responsible individual must stay with you for 24 hours following the procedure.  For the next 24 hours, DO NOT: -Drive a car -Paediatric nurse -Drink alcoholic beverages -Take any medication unless instructed by your physician -Make any legal decisions or sign important papers.  Meals: Start with liquid foods such as gelatin or soup. Progress to regular foods as tolerated. Avoid greasy, spicy, heavy foods. If nausea and/or vomiting occur, drink only clear liquids until the nausea and/or vomiting subsides. Call your physician if vomiting continues.  Special Instructions/Symptoms: Your throat may feel dry or sore from the anesthesia or the breathing tube placed in your throat during surgery. If this causes discomfort, gargle with warm salt water. The discomfort should disappear within 24 hours.  If you had a scopolamine patch placed behind your ear for the management of post- operative nausea and/or vomiting:  1. The medication in the patch is effective for 72 hours, after which it should be removed.  Wrap patch in a tissue and discard in the trash. Wash hands thoroughly with soap and water. 2. You may remove the patch earlier than 72 hours if you experience unpleasant side effects which may include dry mouth, dizziness or visual disturbances. 3. Avoid touching the patch. Wash your hands with soap and water after contact with the patch.

## 2018-05-22 NOTE — Anesthesia Postprocedure Evaluation (Signed)
Anesthesia Post Note  Patient: Evan Avery  Procedure(s) Performed: EXCISION OF CYST ON POSTERIOR NECK ERAS PATHWAY (N/A Neck)     Patient location during evaluation: PACU Anesthesia Type: General Level of consciousness: awake and alert Pain management: pain level controlled Vital Signs Assessment: post-procedure vital signs reviewed and stable Respiratory status: spontaneous breathing, nonlabored ventilation and respiratory function stable Cardiovascular status: blood pressure returned to baseline and stable Postop Assessment: no apparent nausea or vomiting Anesthetic complications: no    Last Vitals:  Vitals:   05/22/18 0946 05/22/18 1043  BP:  115/63  Pulse:  62  Resp:  15  Temp:  36.4 C  SpO2: 96% 97%    Last Pain:  Vitals:   05/22/18 1043  TempSrc:   PainSc: 0-No pain                 Lynda Rainwater

## 2018-05-22 NOTE — H&P (Signed)
Evan Avery Location: [facility] Patient #: [id number] DOB: September 06, 1946 Married / Language: English / Race: White Male  History of Present Illness Patient words: Patient sent at the request of Dr. 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 He's had this cyst on off of last year. He got infected last year was drained. It was then excised by dermatology last year. Last month he developed more swelling and pain. The area with the cyst was beginning to swell and drain again consistent with recurrence of the cyst. He denies fever or chills. He is a smoker and does not stop. He denies any redness, drainage, pain or other symptoms today. He has a history of colostomy reversal and has an incisional hernia which is not new from that. It does not cause him any pain.  The patient is a 72 year old male.   Allergies  NIACIN Dilaudid *ANALGESICS - OPIOID* Itching. Allergies Reconciled  Medication History Latanoprost (0.005% Solution, Ophthalmic) Active. Glucosamine-Chondroitin-Vit D3 (1500-1200-800MG -MG-UNIT Packet, Oral) Active. Coenzyme Q10 (10MG  Capsule, Oral) Active. Alphagan P (0.1% Solution, Ophthalmic) Active. Aspirin (81MG  Tablet, Oral) Active. Atenolol (50MG  Tablet, Oral) Active. Atorvastatin Calcium (40MG  Tablet, Oral) Active. Clopidogrel Bisulfate (75MG  Tablet, Oral) Active. Furosemide (20MG  Tablet, Oral) Active. Glimepiride (2MG  Tablet, Oral) Active. MetFORMIN HCl (1000MG  Tablet, Oral) Active. Omega-3-acid Ethyl Esters (1GM Capsule, Oral) Active. Medications Reconciled     Review of Systems All other systems negative  Vitals Evan Avery; 03/26/2018 9:41 AM) 03/26/2018 9:40 AM Weight: 238.38 lb Height: 68in Body Surface Area: 2.2 m Body Mass Index: 36.24 kg/m  Temp.: 97.36F(Oral)  Pulse: 56 (Regular)  BP: 130/82 (Sitting, Left Arm, Standard)      Physical  Exam  General Mental Status-Alert. General Appearance-Consistent with stated age. Hydration-Well hydrated. Voice-Normal.  Integumentary Note: Posterior neck is a 4 cm x 5 cm chronic epidermal inclusion cyst without signs of infection  Chest and Lung Exam Chest and lung exam reveals -quiet, even and easy respiratory effort with no use of accessory muscles and on auscultation, normal breath sounds, no adventitious sounds and normal vocal resonance. Inspection Chest Wall - Normal. Back - normal.  Cardiovascular Cardiovascular examination reveals -on palpation PMI is normal in location and amplitude, no palpable S3 or S4. Normal cardiac borders., normal heart sounds, regular rate and rhythm with no murmurs, carotid auscultation reveals no bruits and normal pedal pulses bilaterally.  Abdomen Note: Scars from surgery noted with incisional hernia which is reducible soft nontender  Neurologic Neurologic evaluation reveals -alert and oriented x 3 with no impairment of recent or remote memory. Mental Status-Normal.  Musculoskeletal Normal Exam - Left-Upper Extremity Strength Normal and Lower Extremity Strength Normal. Normal Exam - Right-Upper Extremity Strength Normal, Lower Extremity Weakness.    Assessment & Plan   SEBACEOUS CYST (L72.3) Impression: 4 cm x 4 cm posterior neck pt desires excision  Risk of bleeding, infection, recurrence, cosmetic issue, pain, injury to neighboring structures, exacerbation of underlying medical problems. Strongly encouraged smoking cessation since this will increase his complication rate and increased risk of poor wound healing and subsequent chronic wound and/or chronic wound infection.  Current Plans Pt Education - [facility] STOP SMOKING! Pt Education - [facility] Free Text Education/Instructions: discussed with patient and provided information.  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 Observe and signs of incarceration straining a laser were given to patient and wife today. He will observe this  for now since it is asymptomatic

## 2018-05-23 ENCOUNTER — Encounter (HOSPITAL_BASED_OUTPATIENT_CLINIC_OR_DEPARTMENT_OTHER): Payer: Self-pay | Admitting: Surgery

## 2018-05-29 ENCOUNTER — Other Ambulatory Visit: Payer: Self-pay | Admitting: Family Medicine

## 2018-06-20 ENCOUNTER — Ambulatory Visit: Payer: PPO | Admitting: Family Medicine

## 2018-06-21 ENCOUNTER — Encounter: Payer: Self-pay | Admitting: Family Medicine

## 2018-06-21 ENCOUNTER — Ambulatory Visit (INDEPENDENT_AMBULATORY_CARE_PROVIDER_SITE_OTHER): Payer: PPO | Admitting: Family Medicine

## 2018-06-21 VITALS — BP 140/62 | HR 53 | Temp 97.8°F | Ht 68.0 in | Wt 236.2 lb

## 2018-06-21 DIAGNOSIS — E118 Type 2 diabetes mellitus with unspecified complications: Secondary | ICD-10-CM | POA: Diagnosis not present

## 2018-06-21 DIAGNOSIS — E1165 Type 2 diabetes mellitus with hyperglycemia: Secondary | ICD-10-CM

## 2018-06-21 DIAGNOSIS — L720 Epidermal cyst: Secondary | ICD-10-CM | POA: Diagnosis not present

## 2018-06-21 DIAGNOSIS — F17200 Nicotine dependence, unspecified, uncomplicated: Secondary | ICD-10-CM | POA: Diagnosis not present

## 2018-06-21 DIAGNOSIS — IMO0002 Reserved for concepts with insufficient information to code with codable children: Secondary | ICD-10-CM

## 2018-06-21 LAB — POCT GLYCOSYLATED HEMOGLOBIN (HGB A1C): HEMOGLOBIN A1C: 7.4 % — AB (ref 4.0–5.6)

## 2018-06-21 MED ORDER — SITAGLIPTIN PHOSPHATE 50 MG PO TABS: 50.0000 mg | ORAL_TABLET | Freq: Every day | ORAL | 6 refills | Status: DC

## 2018-06-21 NOTE — Assessment & Plan Note (Addendum)
S/p surgical excision, currently healing by second intention - appreciate gen surg care.

## 2018-06-21 NOTE — Assessment & Plan Note (Signed)
Pt continues working towards weight loss, hopeful to be able to manage diabetes better with diet and weight changes with goal to come off some of his medications.

## 2018-06-21 NOTE — Assessment & Plan Note (Signed)
Chronic, congratulated on improvement noted.  Discussed [place] - expensive but effective. He will price out other DPP4 inhibitors, I asked him to price out farxiga and jardiance.  RTC 4 mo f/u visit. Recent abd [place] stable.

## 2018-06-21 NOTE — Progress Notes (Signed)
BP 140/62 (BP Location: Left Arm, Patient Position: Sitting, Cuff Size: Large)   Pulse (!) 53   Temp 97.8 F (36.6 C) (Oral)   Ht 5\' 8"  (1.727 m)   Wt 236 lb 4 oz (107.2 kg)   SpO2 95%   BMI 35.92 kg/m    CC: DM Subjective:    Patient ID: Evan Avery, male    DOB: 1946-06-04, 72 y.o.   MRN: [id number]  HPI: Evan Avery is a 72 y.o. male presenting on 06/21/2018 for Diabetes (Here for 3 mo f/u. Wants to discuss Januvia. Needs refill if to continue. )   Recent sebaceous cyst excision by Dr Brantley Stage - continues healing. pjlanned f/u next week.   DM - does not regularly check sugars: last week 201 fasting. Compliant with antihyperglycemic regimen which includes: amaryl 4mg  daily, metformin 1000mg  bid, januvia 50mg  daily. Denies hypoglycemic symptoms. Denies paresthesias. Last diabetic eye exam 07/2017. Pneumovax: 2014. Prevnar: 2015. Glucometer brand: accucheck. DSME: not interested. Lab Results  Component Value Date   HGBA1C 7.4 (A) 06/21/2018   Diabetic Foot Exam - Simple   Simple Foot Form Diabetic Foot exam was performed with the following findings:  Yes 06/21/2018  9:21 AM  Visual Inspection No deformities, no ulcerations, no other skin breakdown bilaterally:  Yes Sensation Testing Intact to touch and monofilament testing bilaterally:  Yes Pulse Check Posterior Tibialis and Dorsalis pulse intact bilaterally:  Yes Comments Thickened elongated nails    Lab Results  Component Value Date   MICROALBUR 0.2 12/27/2007     Smoking - continued at 1ppd  Relevant past medical, surgical, family and social history reviewed and updated as indicated. Interim medical history since our last visit reviewed. Allergies and medications reviewed and updated. Outpatient Medications Prior to Visit  Medication Sig Dispense Refill  . aspirin 81 MG tablet Take 81 mg by mouth daily.      Marland Kitchen atenolol (TENORMIN) 50 MG tablet TAKE 1 & 1/2 (ONE & ONE-HALF) TABLETS BY MOUTH ONCE DAILY 135  tablet 1  . atorvastatin (LIPITOR) 40 MG tablet Take 1 tablet (40 mg total) by mouth daily at 6 PM. 90 tablet 3  . Brinzolamide-Brimonidine (SIMBRINZA) 1-0.2 % SUSP Apply 1 drop to eye 3 (three) times daily.     . clopidogrel (PLAVIX) 75 MG tablet TAKE 1 TABLET BY MOUTH ONCE DAILY 90 tablet 1  . Coenzyme Q10 200 MG capsule Take 200 mg by mouth daily.    . fluticasone (FLONASE) 50 MCG/ACT nasal spray Place 2 sprays into both nostrils daily. 16 g 1  . glimepiride (AMARYL) 4 MG tablet Take 1 tablet (4 mg total) by mouth daily with breakfast. 90 tablet 1  . ibuprofen (ADVIL,MOTRIN) 200 MG tablet Take 600 mg by mouth every 6 (six) hours as needed for mild pain.    Marland Kitchen ibuprofen (ADVIL,MOTRIN) 800 MG tablet Take 1 tablet (800 mg total) by mouth every 8 (eight) hours as needed. 30 tablet 0  . latanoprost (XALATAN) 0.005 % ophthalmic solution Place 1 drop into both eyes at bedtime.     Marland Kitchen lisinopril (PRINIVIL,ZESTRIL) 5 MG tablet Take 1 tablet (5 mg total) by mouth daily. 90 tablet 3  . metFORMIN (GLUCOPHAGE) 1000 MG tablet TAKE 1 TABLET BY MOUTH TWICE DAILY WITH MEALS 180 tablet 3  . omega-3 acid ethyl esters (LOVAZA) 1 g capsule TAKE 2 CAPSULES BY MOUTH TWICE DAILY 360 capsule 2  . senna-docusate (SENOKOT-S) 8.6-50 MG tablet Take 1 tablet by mouth at bedtime  as needed for mild constipation. 15 tablet 0  . sitaGLIPtin (JANUVIA) 50 MG tablet Take 1 tablet (50 mg total) by mouth daily. 30 tablet 3   No facility-administered medications prior to visit.      Per HPI unless specifically indicated in ROS section below Review of Systems     Objective:    BP 140/62 (BP Location: Left Arm, Patient Position: Sitting, Cuff Size: Large)   Pulse (!) 53   Temp 97.8 F (36.6 C) (Oral)   Ht 5\' 8"  (1.727 m)   Wt 236 lb 4 oz (107.2 kg)   SpO2 95%   BMI 35.92 kg/m   Wt Readings from Last 3 Encounters:  06/21/18 236 lb 4 oz (107.2 kg)  05/22/18 236 lb (107 kg)  04/11/18 238 lb 9.6 oz (108.2 kg)    Physical  Exam  Constitutional: He appears well-developed and well-nourished. No distress.  HENT:  Head: Normocephalic and atraumatic.  Right Ear: External ear normal.  Left Ear: External ear normal.  Nose: Nose normal.  Mouth/Throat: Oropharynx is clear and moist. No oropharyngeal exudate.  Eyes: Pupils are equal, round, and reactive to light. Conjunctivae and EOM are normal. No scleral icterus.  Neck: Normal range of motion. Neck supple.  Cardiovascular: Normal rate, regular rhythm, normal heart sounds and intact distal pulses.  No murmur heard. Pulmonary/Chest: Effort normal and breath sounds normal. No respiratory distress. He has no wheezes. He has no rales.  Musculoskeletal: He exhibits no edema.  See HPI for foot exam if done  Lymphadenopathy:    He has no cervical adenopathy.  Skin: Skin is warm and dry. No rash noted.  Psychiatric: He has a normal mood and affect.  Nursing note and vitals reviewed.  Results for orders placed or performed in visit on 06/21/18  POCT glycosylated hemoglobin (Hb A1C)  Result Value Ref Range   Hemoglobin A1C 7.4 (A) 4.0 - 5.6 %   HbA1c POC (<> result, manual entry)     HbA1c, POC (prediabetic range)     HbA1c, POC (controlled diabetic range)        Assessment & Plan:   Problem List Items Addressed This Visit    Smoker    Continue to encourage smoking cessation. Contemplative.       Severe obesity (BMI 35.0-39.9) with comorbidity ([place])    Pt continues working towards weight loss, hopeful to be able to manage diabetes better with diet and weight changes with goal to come off some of his medications.      Relevant Medications   sitaGLIPtin (JANUVIA) 50 MG tablet   Epidermal cyst of neck    S/p surgical excision, currently healing by second intention - appreciate gen surg care.       Diabetes mellitus type 2, uncontrolled, with complications ([place]) - Primary    Chronic, congratulated on improvement noted.  Discussed [place] - expensive but  effective. He will price out other DPP4 inhibitors, I asked him to price out farxiga and jardiance.  RTC 4 mo f/u visit. Recent abd [place] stable.       Relevant Medications   sitaGLIPtin (JANUVIA) 50 MG tablet   Other Relevant Orders   POCT glycosylated hemoglobin (Hb A1C) (Completed)       Meds ordered this encounter  Medications  . sitaGLIPtin (JANUVIA) 50 MG tablet    Sig: Take 1 tablet (50 mg total) by mouth daily.    Dispense:  30 tablet    Refill:  6   Orders  Placed This Encounter  Procedures  . POCT glycosylated hemoglobin (Hb A1C)    Follow up plan: Return in about 4 months (around 10/21/2018) for annual exam, prior fasting for blood work, medicare wellness visit.  [name], MD

## 2018-06-21 NOTE — Patient Instructions (Addendum)
Januvia is helping sugars - we will refill for now, but before refilling, check with pharmacist on cost of onglyza or tradjenta or nesina in place of [place] or different family of medicines jardiance or farxiga.  Keep working on quitting smoking Return in 4 months for wellness visit and physical

## 2018-06-21 NOTE — Assessment & Plan Note (Signed)
Continue to encourage smoking cessation. Contemplative.  °

## 2018-06-26 ENCOUNTER — Other Ambulatory Visit: Payer: Self-pay | Admitting: Family Medicine

## 2018-06-27 NOTE — Telephone Encounter (Signed)
Plavix Last filled:  03/20/18, #90 Last OV:  06/21/18, f/u Next OV:  12/31/18, CPE

## 2018-08-03 ENCOUNTER — Other Ambulatory Visit: Payer: Self-pay | Admitting: Cardiovascular Disease

## 2018-08-05 NOTE — Telephone Encounter (Signed)
Rx has been sent to the pharmacy electronically. ° °

## 2018-08-09 LAB — VITAMIN D 25 HYDROXY (VIT D DEFICIENCY, FRACTURES): Vit D, 25-Hydroxy: 43.03

## 2018-08-20 DIAGNOSIS — M9903 Segmental and somatic dysfunction of lumbar region: Secondary | ICD-10-CM | POA: Diagnosis not present

## 2018-08-20 DIAGNOSIS — M5416 Radiculopathy, lumbar region: Secondary | ICD-10-CM | POA: Diagnosis not present

## 2018-09-09 ENCOUNTER — Other Ambulatory Visit: Payer: Self-pay | Admitting: Cardiovascular Disease

## 2018-11-14 ENCOUNTER — Telehealth: Payer: Self-pay | Admitting: Family Medicine

## 2018-11-14 NOTE — Telephone Encounter (Signed)
Left message asking pt to call office Please r/s 3/3 appointment with [place] [facility].  Make sure appointment is scheduled prior cpx with dr g.  If pt cannot r/s appointment with lisa prior to dr g appointment please change to lab appointment and mark dr g cpx @@give  medicare wellness [phone]

## 2018-11-15 ENCOUNTER — Other Ambulatory Visit: Payer: Self-pay | Admitting: Family Medicine

## 2018-12-16 ENCOUNTER — Ambulatory Visit: Payer: PPO | Admitting: Cardiovascular Disease

## 2018-12-16 ENCOUNTER — Encounter

## 2018-12-16 ENCOUNTER — Encounter: Payer: Self-pay | Admitting: Cardiovascular Disease

## 2018-12-16 VITALS — BP 128/64 | HR 51 | Ht 68.0 in | Wt 238.6 lb

## 2018-12-16 DIAGNOSIS — E118 Type 2 diabetes mellitus with unspecified complications: Secondary | ICD-10-CM

## 2018-12-16 DIAGNOSIS — Z9989 Dependence on other enabling machines and devices: Secondary | ICD-10-CM

## 2018-12-16 DIAGNOSIS — I2510 Atherosclerotic heart disease of native coronary artery without angina pectoris: Secondary | ICD-10-CM | POA: Diagnosis not present

## 2018-12-16 DIAGNOSIS — I10 Essential (primary) hypertension: Secondary | ICD-10-CM

## 2018-12-16 DIAGNOSIS — E785 Hyperlipidemia, unspecified: Secondary | ICD-10-CM

## 2018-12-16 DIAGNOSIS — G4733 Obstructive sleep apnea (adult) (pediatric): Secondary | ICD-10-CM | POA: Diagnosis not present

## 2018-12-16 DIAGNOSIS — Z720 Tobacco use: Secondary | ICD-10-CM | POA: Diagnosis not present

## 2018-12-16 NOTE — Patient Instructions (Signed)

## 2018-12-16 NOTE — Progress Notes (Signed)
Patient ID: Evan Avery, male   DOB: Jun 02, 1946, 73 y.o.   MRN: [id number]   Primary M.D.: Dr. Danise Mina HPI: Evan Avery is a 74 y.o. male who presents today for an 8 month cardiology evaluation.  Evan Avery has known CAD and has a total of 4 stents in his RCA. In 1992, he underwent initial PTCA of his RCA. Subsequently, he underwent stenting of the proximal and mid RCA.  In 1999 his fourth stent was placed in the mid RCA between the 2 previously placed stents and was a 3.5x28 mm non-DES Liberty stent. His last nuclear perfusion study in August 2012 showed inferior thinning without significant ischemia.  EF was not calculated secondary to  ectopy but previously had been 52%.  Additional problems include hypertension, hyperlipidemia, obesity, and obstructive sleep apnea for which he is he uses CPAP 100% of the time and uses  [employer] for his [employer].  Since I last saw him in May 2017.  He denied any recurrent episodes of chest pain or significant dyspnea.  He had a new CPAP machine and continue to be compliant.  He established primary care with Dr. Danise Mina.  Review of laboratory that he had with him in December 2016 has shown a cholesterol of 149, HDL 27, but his triglycerides were 413.  Remotely he had triglyceride elevations as high as 584.  In December, his hemoglobin A1c was 7.7.  He underwent surgery in July 2017 due to an abscess in his colon.  He required a colostomy for 6 months and in January 2018 underwent colostomy closure.   I saw him in October 2018 after not having seen him in over 17 months.  Since that evaluation, he has undergone 2 resections for cyst on the back of his neck with recurrent infections.  He recently saw Dr. Brantley Stage consideration of surgical excision.  He has felt well with reference to chest pain or shortness of breath.  He had smoked for many years but quit smoking for 10 years but unfortunately resumed smoking in June 2018 and currently smoking  1 pack/day.  He has not had a nuclear perfusion study since 2012.  He continues to use CPAP with 100% compliance.  He was  started on Januvia for his diabetes mellitus by his primary physician.    I last saw Evan Avery in June 2019 and he was in need of back and neck surgery.  I recommended he undergo a [place] study for preoperative assessment.  This was done on April 23, 2018 which showed a small inferior defect, low risk.  EF was 50% with basal inferior hypokinesis.  He tolerated his neck and back surgery well.  Is only he is without anginal symptoms.  He has a documented umbilical/ventral  hernia and has been without abdominal discomfort.  He continues to use CPAP with 100% compliance.  He tells me he received a new CPAP machine at the [facility] is a ResMed AirSense 10.  He gets his supplies from the [place].  He presents for evaluation.  Past Medical History:  Diagnosis Date  . Agent orange exposure 1968  . Arthritis   . CAD (coronary artery disease)    4 stents in right artery  . Complex tear of medial meniscus of left knee as current injury 07/26/2012   [place]  . Diabetes mellitus    type 2  . Diverticulitis of colon with perforation 05/23/2016  . Glaucoma 2006   on drops, followed by [place]  .  Hyperlipidemia   . Hypertension   . Intra-abdominal abscess ([place]) 05/16/2016  . LUMBAR RADICULOPATHY 07/18/2007   Qualifier: Diagnosis of  By: Arnoldo Morale MD, Balinda Quails   . Myocardial infarction [facility])    1992  . Sleep apnea    uses CPAP regularly    Past Surgical History:  Procedure Laterality Date  . APPENDECTOMY  1958  . BOWEL RESECTION N/A 05/23/2016   Procedure: SMALL BOWEL RESECTION;  Surgeon: Jackolyn Confer, MD;  Location: [facility];  Service: General;  Laterality: N/A;  . CARDIAC CATHETERIZATION  08/11/2008   angiographically patent LAD diagonal, circumflex and multiple circumflex obtuse marginal branches from the L coronary artery; stenting of mid R coronary artery with 3.5 x 60m Liberte non DES  postdilated w/ 4.0 DuraStent ([name]  . CARDIOVASCULAR STRESS TEST  06/16/2011   R/P MV - moderate perfusion defect in basal inferoseptal, basal inferior, mid inferseptal and mid inferior regions, consistent with infarct/scar and/or diaphragmatic attenuation; non-gated study secondary to ectopy; no CP or EKG changes for ischemia; abnormal study although no significant changes from previous study; in the absense of gated images cannot calculate EF or distinguish scar/artifact  . COLONOSCOPY  08/2016   TAx5, sessile serrated polyp, diverticulosis, rpt 2 yrs ([place])  . COLOSTOMY Left 05/23/2016   Procedure: COLOSTOMY;  Surgeon: TJackolyn Confer MD;  Location: [facility];  Service: General;  Laterality: Left;  . COLOSTOMY TAKEDOWN N/A 11/10/2016   Procedure: LAPAROSCOPIC LYSIS OF ADHESIONS AND  COLOSTOMY CLOSURE;  Surgeon: TJackolyn Confer MD;  Location: [facility];  Service: General;  Laterality: N/A;  . CYST EXCISION  04/2018   posterior neck ([place])  . DOPPLER ECHOCARDIOGRAPHY  08/20/2008   EF >>54% LV systolic function normal; LV mildly dilated; doppler flow suggestive of impaired LV relaxation; RV mildly dilated, RV systolic function normal, RV systolic pressure normal  . INCISION AND DRAINAGE ABSCESS N/A 05/23/2016   Procedure: DRAINAGE OF MULTIPLE INTRA  ABDOMINAL ABSCESS;  Surgeon: TJackolyn Confer MD;  Location: [facility];  Service: General;  Laterality: N/A;  . KNEE SURGERY Left 2015   torn meniscus ([place]  . LAPAROSCOPIC LYSIS OF ADHESIONS  11/10/2016   Procedure: LAPAROSCOPIC LYSIS OF ADHESIONS;  Surgeon: TJackolyn Confer MD;  Location: [facility];  Service: General;;  . LAPAROTOMY N/A 05/23/2016   Procedure: EXPLORATORY LAPAROTOMY;  Surgeon: TJackolyn Confer MD;  Location: [facility];  Service: General;  Laterality: N/A;  . MASS EXCISION N/A 05/22/2018   Procedure: EXCISION OF CYST ON POSTERIOR NECK ERAS PATHWAY;  Surgeon: CErroll Luna MD;  Location: [facility]  Service: General;   Laterality: N/A;  . PERCUTANEOUS CORONARY STENT INTERVENTION (PCI-S)  1992, 1999, 2004   4 vessels ([name]  . PROCTOSCOPY  11/10/2016   Procedure: PROCTOSCOPY;  Surgeon: TJackolyn Confer MD;  Location: [facility];  Service: General;;    Allergies  Allergen Reactions  . Dilaudid [Hydromorphone Hcl] Itching  . Ambien [Zolpidem] Other (See Comments)    Pt states "makes me wild"  . Niacin And Related Other (See Comments)    Extreme fatigue    Current Outpatient Medications  Medication Sig Dispense Refill  . aspirin 81 MG tablet Take 81 mg by mouth daily.      .Marland Kitchenatenolol (TENORMIN) 50 MG tablet TAKE 1 & 1/2 (ONE & ONE-HALF) TABLETS BY MOUTH ONCE DAILY 135 tablet 1  . atorvastatin (LIPITOR) 40 MG tablet TAKE 1 TABLET BY MOUTH ONCE DAILY AT  6PM 90 tablet 3  . Brinzolamide-Brimonidine (SIMBRINZA) 1-0.2 %  SUSP Apply 1 drop to eye 3 (three) times daily.     . clopidogrel (PLAVIX) 75 MG tablet TAKE 1 TABLET BY MOUTH ONCE DAILY 90 tablet 1  . glimepiride (AMARYL) 4 MG tablet TAKE 1 TABLET BY MOUTH ONCE DAILY WITH  BREAKFAST 90 tablet 1  . ibuprofen (ADVIL,MOTRIN) 200 MG tablet Take 600 mg by mouth every 6 (six) hours as needed for mild pain.    Marland Kitchen ibuprofen (ADVIL,MOTRIN) 800 MG tablet Take 1 tablet (800 mg total) by mouth every 8 (eight) hours as needed. 30 tablet 0  . latanoprost (XALATAN) 0.005 % ophthalmic solution Place 1 drop into both eyes at bedtime.     Marland Kitchen lisinopril (PRINIVIL,ZESTRIL) 5 MG tablet TAKE 1 TABLET BY MOUTH ONCE DAILY 90 tablet 2  . metFORMIN (GLUCOPHAGE) 1000 MG tablet TAKE 1 TABLET BY MOUTH TWICE DAILY WITH MEALS 180 tablet 3  . omega-3 acid ethyl esters (LOVAZA) 1 g capsule TAKE 2 CAPSULES BY MOUTH TWICE DAILY 360 capsule 0  . senna-docusate (SENOKOT-S) 8.6-50 MG tablet Take 1 tablet by mouth at bedtime as needed for mild constipation. 15 tablet 0   No current facility-administered medications for this visit.     Socially he is married has 2 children 2 grandchildren. He does  walk several times a week. He does have a tobacco history. There is no alcohol use.   ROS General: Negative; No fevers, chills, or night sweats; positive for obesity. HEENT: Negative; No changes in vision or hearing, sinus congestion, difficulty swallowing Pulmonary: Negative; No cough, wheezing, shortness of breath, hemoptysis Cardiovascular: Negative; No chest pain, presyncope, syncope, palpatations GI: Colonic abscess requiring colostomy, and ultimate closure GU: Negative; No dysuria, hematuria, or difficulty voiding Musculoskeletal: He is status post left knee surgery; no myalgias, joint pain, or weakness Hematologic/Oncology: Negative; no easy bruising, bleeding Endocrine: Negative; no heat/cold intolerance; no diabetes Neuro: Negative; no changes in balance, headaches Skin: Recurrent cysts in the back of his neck Psychiatric: Negative; No behavioral problems, depression Sleep: Positive for sleep apnea, on CPAP with 100% compliance. No snoring, daytime sleepiness, hypersomnolence, bruxism, restless legs, hypnogognic hallucinations, no cataplexy Other comprehensive 14 point system review is negative.   PE BP 128/64   Pulse (!) 51   Ht _0  (1.727 m)   Wt 238 lb 9.6 oz (108.2 kg)   BMI 36.28 kg/m    Repeat blood pressure by me was 136/70  Wt Readings from Last 3 Encounters:  12/16/18 238 lb 9.6 oz (108.2 kg)  06/21/18 236 lb 4 oz (107.2 kg)  05/22/18 236 lb (107 kg)   General: Alert, oriented, no distress.  Skin: normal turgor, no rashes, warm and dry HEENT: Normocephalic, atraumatic. Pupils equal round and reactive to light; sclera anicteric; extraocular muscles intact; Nose without nasal septal hypertrophy Mouth/Parynx benign; status post UPPP surgery  Neck: Thick neck; no JVD, no carotid bruits; normal carotid upstroke Lungs: clear to ausculatation and percussion; no wheezing or rales Chest wall: without tenderness to palpitation Heart: PMI not displaced, RRR, s1 s2  normal, 1/6 systolic murmur, no diastolic murmur, no rubs, gallops, thrills, or heaves Abdomen: Audible obesity.  Moderate sized umbilical/ventral hernia in the region of the umbilicus;  soft, nontender; no hepatosplenomehaly, BS+; abdominal aorta nontender and not dilated by palpation. Back: no CVA tenderness Pulses 2+ Musculoskeletal: full range of motion, normal strength, no joint deformities Extremities: no clubbing cyanosis or edema, Homan's sign negative  Neurologic: grossly nonfocal; Cranial nerves grossly wnl Psychologic: Normal mood and affect  ECG (independently read by me): Sinus Bradycardioa 51; Norml intervals  June 2018 ECG (independently read by me): Sinus bradycardia 57 bpm.  No ectopy.  Normal intervals.  October 2018 ECG (independently read by me): Normal sinus rhythm with PAC.  PR interval 168 ms.  QTc interval 454 ms.  May 2017 ECG (independently read by me): Sinus bradycardia 52 bpm.  T-wave abnormality in leads 3 and aVF.  May 2016 ECG (independently read by me): Sinus bradycardia 53 bpm.  Nondiagnostic T changes with T-wave inversion in lead 3.  June 2015 ECG (independently read by me):  sinus bradycardia at 55 QTc interval 447 ms.  PR interval 174 ms.  Nondiagnostic T changes in lead 3.  ECG: Sinus rhythm at 56 beats per minute. No ectopy. Normal intervals; nonspecific T changes in lead 3.  LABS: BMP Latest Ref Rng & Units 05/15/2018 03/14/2018 12/14/2017  Glucose 70 - 99 mg/dL 228(H) 233(H) 281(H)  BUN 8 - 23 mg/dL _0 Creatinine 0.61 - 1.24 mg/dL 1.07 1.05 1.05  Sodium 135 - 145 mmol/L 141 140 139  Potassium 3.5 - 5.1 mmol/L 4.9 4.9 5.2(H)  Chloride 98 - 111 mmol/L 105 104 104  CO2 22 - 32 mmol/L _1 Calcium 8.9 - 10.3 mg/dL 9.8 10.1 9.8   Hepatic Function Latest Ref Rng & Units 03/14/2018 12/14/2017 12/06/2016  Total Protein 6.0 - 8.3 g/dL 7.0 6.7 6.6  Albumin 3.5 - 5.2 g/dL 4.3 4.0 4.4  AST 0 - 37 U/L _2 ALT 0 - 53 U/L _3 Alk  Phosphatase 39 - 117 U/L 54 66 43  Total Bilirubin 0.2 - 1.2 mg/dL 0.7 0.6 0.4  Bilirubin, Direct 0.0 - 0.3 mg/dL - - -    CBC Latest Ref Rng & Units 03/23/2017 11/11/2016 11/07/2016  WBC 4.0 - 10.5 K/uL 5.3 9.7 7.4  Hemoglobin 13.0 - 17.0 g/dL 12.9(L) 10.8(L) 12.4(L)  Hematocrit 39.0 - 52.0 % 38.0(L) 32.0(L) 36.2(L)  Platelets 150.0 - 400.0 K/uL 153.0 146(L) 170   Lab Results  Component Value Date   TSH 1.113 05/16/2016   Lab Results  Component Value Date   HGBA1C 7.4 (A) 06/21/2018   Lipid Panel     Component Value Date/Time   CHOL 122 03/14/2018 0805   CHOL 149 03/13/2016 0803   TRIG 247.0 (H) 03/14/2018 0805   TRIG 387 (H) 03/13/2016 0803   HDL 27.90 (L) 03/14/2018 0805   HDL 26 (L) 03/13/2016 0803   CHOLHDL 4 03/14/2018 0805   VLDL 49.4 (H) 03/14/2018 0805   LDLCALC 69 12/06/2016 0825   LDLCALC 46 03/13/2016 0803   LDLDIRECT 61.0 03/14/2018 0805    RADIOLOGY: No results found.  IMPRESSION:  1. CAD in native artery   2. Essential hypertension   3. Hyperlipidemia with target LDL less than 70   4. OSA on CPAP   5. Type 2 diabetes mellitus with complication, without long-term current use of insulin ([place])   6. Tobacco abuse      ASSESSMENT AND PLAN: Evan Avery  is a 73 year old Caucasian male who has CAD and underwent initial PTCA to his RCA in 1992.  His last catheterization was in October 2009 when his fourth stent was placed in his RCA.  He has continued to be free of recurrent anginal symptomatology.  He continues to be on dual antiplatelet therapy with aspirin and Plavix with his multiple stents.  He has type 2 diabetes mellitus on glimepiride  in addition to metformin and  Januvia was initiated.  Unfortunately he resumed smoking after not smoking for 10 years.  He has obstructive sleep apnea and cannot sleep without CPAP.  He has 100% compliance.  He received a [facility] APAP unit from the [facility]  Since I last saw him, he tolerated his backslash neck surgery well.   A preoperative Lexiscan Myoview study remains low risk and showed a small inferior defect, EF 50%.  He has a history of hypertension and blood pressure today is stable on lisinopril 5 mg and atenolol 50 mg.  He continues to be on aspirin and Plavix for dual antiplatelet therapy.  He had held Plavix for close to 1 week prior to his surgery.  He is diabetic on glimepiride and metformin.  He is on atorvastatin 40 mg in addition to Lovaza 2 capsules twice a day for his hyperlipidemia with target LDL less than 70.  Tells me he had undergone recent laboratory at the [place].  I do not have these results.  If triglycerides remain elevated he may be a candidate to switch to Vascepa which now has outcome data based on the "reduce-it" trial.  He sees his primary doctor at six-month intervals and also goes to the [place] as well.  As long as he remains cardiac stable I will see him in 1 year for reevaluation or sooner if problems arise.  Time spent: 25 minutes  Troy Sine, MD, [facility]  12/17/2018 11:14 AM

## 2018-12-17 ENCOUNTER — Encounter: Payer: Self-pay | Admitting: Cardiovascular Disease

## 2018-12-20 NOTE — Addendum Note (Signed)
Addended by: Therisa Doyne on: 12/20/2018 04:54 PM   Modules accepted: Orders

## 2018-12-24 ENCOUNTER — Ambulatory Visit: Payer: PPO

## 2018-12-27 ENCOUNTER — Ambulatory Visit (INDEPENDENT_AMBULATORY_CARE_PROVIDER_SITE_OTHER): Payer: PPO

## 2018-12-27 VITALS — BP 118/60 | HR 51 | Temp 98.3°F | Ht 68.5 in | Wt 235.0 lb

## 2018-12-27 DIAGNOSIS — E1165 Type 2 diabetes mellitus with hyperglycemia: Secondary | ICD-10-CM

## 2018-12-27 DIAGNOSIS — IMO0002 Reserved for concepts with insufficient information to code with codable children: Secondary | ICD-10-CM

## 2018-12-27 DIAGNOSIS — E118 Type 2 diabetes mellitus with unspecified complications: Secondary | ICD-10-CM | POA: Diagnosis not present

## 2018-12-27 DIAGNOSIS — E785 Hyperlipidemia, unspecified: Secondary | ICD-10-CM | POA: Diagnosis not present

## 2018-12-27 DIAGNOSIS — Z0000 Encounter for general adult medical examination without abnormal findings: Secondary | ICD-10-CM | POA: Diagnosis not present

## 2018-12-27 DIAGNOSIS — Z125 Encounter for screening for malignant neoplasm of prostate: Secondary | ICD-10-CM

## 2018-12-27 LAB — LIPID PANEL
Cholesterol: 138 mg/dL (ref 0–200)
HDL: 29.7 mg/dL — ABNORMAL LOW (ref >39.00)
NONHDL: 108.12
Total CHOL/HDL Ratio: 5
Triglycerides: 279 mg/dL — ABNORMAL HIGH (ref 0.0–149.0)
VLDL: 55.8 mg/dL — ABNORMAL HIGH (ref 0.0–40.0)

## 2018-12-27 LAB — COMPREHENSIVE METABOLIC PANEL
ALK PHOS: 68 U/L (ref 39–117)
ALT: 23 U/L (ref 0–53)
AST: 14 U/L (ref 0–37)
Albumin: 4.6 g/dL (ref 3.5–5.2)
BUN: 15 mg/dL (ref 6–23)
CO2: 29 meq/L (ref 19–32)
Calcium: 9.9 mg/dL (ref 8.4–10.5)
Chloride: 103 mEq/L (ref 96–112)
Creatinine, Ser: 1.12 mg/dL (ref 0.40–1.50)
GFR: 64.2 mL/min (ref >60.00)
GLUCOSE: 151 mg/dL — AB (ref 70–99)
POTASSIUM: 5.2 meq/L — AB (ref 3.5–5.1)
SODIUM: 138 meq/L (ref 135–145)
TOTAL PROTEIN: 6.8 g/dL (ref 6.0–8.3)
Total Bilirubin: 0.5 mg/dL (ref 0.2–1.2)

## 2018-12-27 LAB — HEMOGLOBIN A1C: HEMOGLOBIN A1C: 8.8 % — AB (ref 4.6–6.5)

## 2018-12-27 LAB — PSA, MEDICARE: PSA: 0.64 ng/mL (ref 0.10–4.00)

## 2018-12-27 LAB — LDL CHOLESTEROL, DIRECT: LDL DIRECT: 61 mg/dL

## 2018-12-27 NOTE — Progress Notes (Signed)
PCP notes:   Health maintenance:  A1C - completed Colonoscopy - addressed  Abnormal screenings:   Hearing - failed  Hearing Screening   125Hz  250Hz  500Hz  1000Hz  2000Hz  3000Hz  4000Hz  6000Hz  8000Hz   Right ear:   40 40 40  40    Left ear:   40 40 40  0    Vision Screening Comments: Vision exam in Dec 2019 @ [facility]  Patient concerns:   None  Nurse concerns:  None  Next PCP appt:   12/31/18 @ 0830

## 2018-12-27 NOTE — Patient Instructions (Addendum)
Evan Avery , Thank you for taking time to come for your Medicare Wellness Visit. I appreciate your ongoing commitment to your health goals. Please review the following plan we discussed and let me know if I can assist you in the future.   These are the goals we discussed: Goals    . DIET - INCREASE WATER INTAKE     Starting 12/27/2018, I will continue to drink at least 6-8 glasses of water daily.        This is a list of the screening recommended for you and due dates:  Health Maintenance  Topic Date Due  . Colon Cancer Screening  10/23/2019*  . Complete foot exam   06/22/2019  . Hemoglobin A1C  06/29/2019  . Eye exam for diabetics  09/23/2019  . DTaP/Tdap/Td vaccine (2 - Td) 10/23/2022  . Tetanus Vaccine  10/23/2022  . Flu Shot  Completed  .  Hepatitis C: One time screening is recommended by Center for Disease Control  (CDC) for  adults born from [birth year] through 1966.   Completed  . Pneumonia vaccines  Completed  *Topic was postponed. The date shown is not the original due date.   Preventive Care for Adults  A healthy lifestyle and preventive care can promote health and wellness. Preventive health guidelines for adults include the following key practices.  . A routine yearly physical is a good way to check with your health care provider about your health and preventive screening. It is a chance to share any concerns and updates on your health and to receive a thorough exam.  . Visit your dentist for a routine exam and preventive care every 6 months. Brush your teeth twice a day and floss once a day. Good oral hygiene prevents tooth decay and gum disease.  . The frequency of eye exams is based on your age, health, family medical history, use  of contact lenses, and other factors. Follow your health care provider's recommendations for frequency of eye exams.  . Eat a healthy diet. Foods like vegetables, fruits, whole grains, low-fat dairy products, and lean protein foods contain the  nutrients you need without too many calories. Decrease your intake of foods high in solid fats, added sugars, and salt. Eat the right amount of calories for you. Get information about a proper diet from your health care provider, if necessary.  . Regular physical exercise is one of the most important things you can do for your health. Most adults should get at least 150 minutes of moderate-intensity exercise (any activity that increases your heart rate and causes you to sweat) each week. In addition, most adults need muscle-strengthening exercises on 2 or more days a week.  Silver Sneakers may be a benefit available to you. To determine eligibility, you may visit the website: www.silversneakers.com or contact program at 604-366-2606 Mon-Fri between 8AM-8PM.   . Maintain a healthy weight. The body mass index (BMI) is a screening tool to identify possible weight problems. It provides an estimate of body fat based on height and weight. Your health care provider can find your BMI and can help you achieve or maintain a healthy weight.   For adults 20 years and older: ? A BMI below 18.5 is considered underweight. ? A BMI of 18.5 to 24.9 is normal. ? A BMI of 25 to 29.9 is considered overweight. ? A BMI of 30 and above is considered obese.   . Maintain normal blood lipids and cholesterol levels by exercising and minimizing your  intake of saturated fat. Eat a balanced diet with plenty of fruit and vegetables. Blood tests for lipids and cholesterol should begin at age 86 and be repeated every 5 years. If your lipid or cholesterol levels are high, you are over 50, or you are at high risk for heart disease, you may need your cholesterol levels checked more frequently. Ongoing high lipid and cholesterol levels should be treated with medicines if diet and exercise are not working.  . If you smoke, find out from your health care provider how to quit. If you do not use tobacco, please do not start.  . If you  choose to drink alcohol, please do not consume more than 2 drinks per day. One drink is considered to be 12 ounces (355 mL) of beer, 5 ounces (148 mL) of wine, or 1.5 ounces (44 mL) of liquor.  . If you are 34-84 years old, ask your health care provider if you should take aspirin to prevent strokes.  . Use sunscreen. Apply sunscreen liberally and repeatedly throughout the day. You should seek shade when your shadow is shorter than you. Protect yourself by wearing long sleeves, pants, a wide-brimmed hat, and sunglasses year round, whenever you are outdoors.  . Once a month, do a whole body skin exam, using a mirror to look at the skin on your back. Tell your health care provider of new moles, moles that have irregular borders, moles that are larger than a pencil eraser, or moles that have changed in shape or color.

## 2018-12-27 NOTE — Progress Notes (Signed)
Subjective:   Evan Avery is a 73 y.o. male who presents for Medicare Annual/Subsequent preventive examination.  Review of Systems:  N/A Cardiac Risk Factors include: advanced age (>32men, >41 women);male gender;obesity (BMI >30kg/m2);diabetes mellitus;dyslipidemia;hypertension     Objective:    Vitals: BP 118/60 (BP Location: Right Arm, Patient Position: Sitting, Cuff Size: Normal)   Pulse (!) 51   Temp 98.3 F (36.8 C) (Oral)   Ht 5' 8.5" (1.74 m) Comment: shoes  Wt 235 lb (106.6 kg)   SpO2 96%   BMI 35.21 kg/m   Body mass index is 35.21 kg/m.  Advanced Directives 12/27/2018 05/22/2018 05/14/2018 12/14/2017 12/06/2016 11/10/2016 11/07/2016  Does Patient Have a Medical Advance Directive? No No No No No No No  Type of Advance Directive - - - - - - -  Does patient want to make changes to medical advance directive? - - - - - - -  Copy of [place] in Chart? - - - - - - -  Would patient like information on creating a medical advance directive? No - Patient declined No - Patient declined No - Patient declined No - Patient declined - No - Patient declined -  Pre-existing out of facility DNR order (yellow form or pink MOST form) - - - - - - -    Tobacco Social History   Tobacco Use  Smoking Status Current Every Day Smoker  . Packs/day: 1.00  . Years: 50.00  . Pack years: 50.00  . Types: Cigarettes  . Start date: 10/23/1958  Smokeless Tobacco Never Used     Ready to quit: No Counseling given: No   Clinical Intake:  Pre-visit preparation completed: Yes  Pain : No/denies pain Pain Score: 0-No pain     Nutritional Status: BMI > 30  Obese Nutritional Risks: None Diabetes: Yes CBG done?: No Did pt. bring in CBG monitor from home?: No  How often do you need to have someone help you when you read instructions, pamphlets, or other written materials from your doctor or pharmacy?: 1 - Never  Interpreter Needed?: No  Comments: pt lives with  spouse Information entered by :: LPinson, LPN  Past Medical History:  Diagnosis Date  . Agent orange exposure 1968  . Arthritis   . CAD (coronary artery disease)    4 stents in right artery  . Complex tear of medial meniscus of left knee as current injury 07/26/2012   [place]  . Diabetes mellitus    type 2  . Diverticulitis of colon with perforation 05/23/2016  . Glaucoma 2006   on drops, followed by [place]  . Hyperlipidemia   . Hypertension   . Intra-abdominal abscess ([place]) 05/16/2016  . LUMBAR RADICULOPATHY 07/18/2007   Qualifier: Diagnosis of  By: Arnoldo Morale MD, Balinda Quails   . Myocardial infarction [facility])    1992  . Sleep apnea    uses CPAP regularly   Past Surgical History:  Procedure Laterality Date  . APPENDECTOMY  1958  . BOWEL RESECTION N/A 05/23/2016   Procedure: SMALL BOWEL RESECTION;  Surgeon: Jackolyn Confer, MD;  Location: [facility];  Service: General;  Laterality: N/A;  . CARDIAC CATHETERIZATION  08/11/2008   angiographically patent LAD diagonal, circumflex and multiple circumflex obtuse marginal branches from the L coronary artery; stenting of mid R coronary artery with 3.5 x 22mm Liberte non DES postdilated w/ 4.0 DuraStent [name])  . CARDIOVASCULAR STRESS TEST  06/16/2011   R/P MV - moderate perfusion defect in basal  inferoseptal, basal inferior, mid inferseptal and mid inferior regions, consistent with infarct/scar and/or diaphragmatic attenuation; non-gated study secondary to ectopy; no CP or EKG changes for ischemia; abnormal study although no significant changes from previous study; in the absense of gated images cannot calculate EF or distinguish scar/artifact  . COLONOSCOPY  08/2016   TAx5, sessile serrated polyp, diverticulosis, rpt 2 yrs (Outlaw)  . COLOSTOMY Left 05/23/2016   Procedure: COLOSTOMY;  Surgeon: Jackolyn Confer, MD;  Location[facility];  Service: General;  Laterality: Left;  . COLOSTOMY TAKEDOWN N/A 11/10/2016   Procedure: LAPAROSCOPIC LYSIS OF ADHESIONS AND   COLOSTOMY CLOSURE;  Surgeon: Jackolyn Confer, MD;  [facility];  Service: General;  Laterality: N/A;  . CYST EXCISION  04/2018   posterior neck ([place])  . DOPPLER ECHOCARDIOGRAPHY  08/20/2008   EF >72%; LV systolic function normal; LV mildly dilated; doppler flow suggestive of impaired LV relaxation; RV mildly dilated, RV systolic function normal, RV systolic pressure normal  . INCISION AND DRAINAGE ABSCESS N/A 05/23/2016   Procedure: DRAINAGE OF MULTIPLE INTRA  ABDOMINAL ABSCESS;  Surgeon: Jackolyn Confer, MD;  [facility];  Service: General;  Laterality: N/A;  . KNEE SURGERY Left 2015   torn meniscus Raliegh Ip)  . LAPAROSCOPIC LYSIS OF ADHESIONS  11/10/2016   Procedure: LAPAROSCOPIC LYSIS OF ADHESIONS;  Surgeon: Jackolyn Confer, MD;  [facility];  Service: General;;  . LAPAROTOMY N/A 05/23/2016   Procedure: EXPLORATORY LAPAROTOMY;  Surgeon: Jackolyn Confer, MD;  [facility];  Service: General;  Laterality: N/A;  . MASS EXCISION N/A 05/22/2018   Procedure: EXCISION OF CYST ON POSTERIOR NECK ERAS PATHWAY;  Surgeon: Erroll Luna, MD;  Location: [place];  Service: General;  Laterality: N/A;  . PERCUTANEOUS CORONARY STENT INTERVENTION (PCI-S)  1992, 1999, 2004   4 vessels [name])  . PROCTOSCOPY  11/10/2016   Procedure: PROCTOSCOPY;  Surgeon: Jackolyn Confer, MD;  [facility];  Service: General;;   Family History  Problem Relation Age of Onset  . Alzheimer's disease Mother   . Heart disease Father   . Heart attack Brother   . Diabetes Brother   . Heart disease Maternal Grandmother   . Cancer Maternal Grandfather   . Diabetes Paternal Grandfather    Social History   Socioeconomic History  . Marital status: Married    Spouse name: Not on file  . Number of children: Not on file  . Years of education: Not on file  . Highest education level: Not on file  Occupational History  . Not on file  Social Needs  . Financial resource strain: Not on  file  . Food insecurity:    Worry: Not on file    Inability: Not on file  . Transportation needs:    Medical: Not on file    Non-medical: Not on file  Tobacco Use  . Smoking status: Current Every Day Smoker    Packs/day: 1.00    Years: 50.00    Pack years: 50.00    Types: Cigarettes    Start date: 10/23/1958  . Smokeless tobacco: Never Used  Substance and Sexual Activity  . Alcohol use: Yes    Alcohol/week: 0.0 standard drinks    Comment: rarely  . Drug use: No  . Sexual activity: Yes  Lifestyle  . Physical activity:    Days per week: Not on file    Minutes per session: Not on file  . Stress: Not on file  Relationships  . Social connections:  Talks on phone: Not on file    Gets together: Not on file    Attends religious service: Not on file    Active member of club or organization: Not on file    Attends meetings of clubs or organizations: Not on file    Relationship status: Not on file  Other Topics Concern  . Not on file  Social History Narrative   Lives with wife and son (with mental issues)   Occupation: retired, was Multimedia programmer for [employer]   Activity: no regular exercise   Diet: good water, fruits/vegetables daily    Outpatient Encounter Medications as of 12/27/2018  Medication Sig  . aspirin 81 MG tablet Take 81 mg by mouth daily.    Marland Kitchen atenolol (TENORMIN) 50 MG tablet TAKE 1 & 1/2 (ONE & ONE-HALF) TABLETS BY MOUTH ONCE DAILY  . atorvastatin (LIPITOR) 40 MG tablet TAKE 1 TABLET BY MOUTH ONCE DAILY AT  6PM  . Brinzolamide-Brimonidine (SIMBRINZA) 1-0.2 % SUSP Apply 1 drop to eye 3 (three) times daily.   . clopidogrel (PLAVIX) 75 MG tablet TAKE 1 TABLET BY MOUTH ONCE DAILY  . glimepiride (AMARYL) 4 MG tablet TAKE 1 TABLET BY MOUTH ONCE DAILY WITH  BREAKFAST  . ibuprofen (ADVIL,MOTRIN) 200 MG tablet Take 600 mg by mouth every 6 (six) hours as needed for mild pain.  Marland Kitchen ibuprofen (ADVIL,MOTRIN) 800 MG tablet Take 1 tablet (800 mg total) by mouth every 8 (eight)  hours as needed.  . latanoprost (XALATAN) 0.005 % ophthalmic solution Place 1 drop into both eyes at bedtime.   Marland Kitchen lisinopril (PRINIVIL,ZESTRIL) 5 MG tablet TAKE 1 TABLET BY MOUTH ONCE DAILY  . metFORMIN (GLUCOPHAGE) 1000 MG tablet TAKE 1 TABLET BY MOUTH TWICE DAILY WITH MEALS  . omega-3 acid ethyl esters (LOVAZA) 1 g capsule TAKE 2 CAPSULES BY MOUTH TWICE DAILY  . senna-docusate (SENOKOT-S) 8.6-50 MG tablet Take 1 tablet by mouth at bedtime as needed for mild constipation.   No facility-administered encounter medications on file as of 12/27/2018.     Activities of Daily Living In your present state of health, do you have any difficulty performing the following activities: 12/27/2018 05/22/2018  Hearing? N N  Vision? N N  Difficulty concentrating or making decisions? N N  Walking or climbing stairs? N N  Dressing or bathing? N N  Doing errands, shopping? N -  Preparing Food and eating ? N -  Using the Toilet? N -  In the past six months, have you accidently leaked urine? N -  Do you have problems with loss of bowel control? N -  Managing your Medications? N -  Managing your Finances? N -  Housekeeping or managing your Housekeeping? N -  Some recent data might be hidden    Patient Care Team: [name], MD as PCP - General (Family Medicine) Troy Sine, MD as Consulting Physician (Cardiology) Jackolyn Confer, MD as Consulting Physician (General Surgery)   Assessment:   This is a routine wellness examination for Kaycen.   Hearing Screening   125Hz  250Hz  500Hz  1000Hz  2000Hz  3000Hz  4000Hz  6000Hz  8000Hz   Right ear:   40 40 40  40    Left ear:   40 40 40  0    Vision Screening Comments: Vision exam in Dec 2019 @ [facility]   Exercise Activities and Dietary recommendations Current Exercise Habits: The patient does not participate in regular exercise at present, Exercise limited by: orthopedic condition(s)  Goals    . DIET -  INCREASE WATER INTAKE     Starting 12/27/2018, I will  continue to drink at least 6-8 glasses of water daily.        Fall Risk Fall Risk  12/27/2018 12/14/2017 12/06/2016 06/21/2016 10/22/2015  Falls in the past year? 0 No No No No  Risk for fall due to : - - - (No Data) -  Risk for fall due to: Comment - - - None -   Depression Screen PHQ 2/9 Scores 12/27/2018 12/14/2017 12/06/2016 06/21/2016  PHQ - 2 Score 0 0 0 1  PHQ- 9 Score 0 0 - -    Cognitive Function MMSE - Mini Mental State Exam 12/27/2018 12/14/2017 12/06/2016  Orientation to time 5 5 5   Orientation to Place 5 5 5   Registration 3 3 3   Attention/ Calculation 0 0 0  Recall 3 3 3   Language- name 2 objects 0 0 0  Language- repeat 1 1 1   Language- follow 3 step command 3 3 3   Language- read & follow direction 0 0 0  Write a sentence 0 0 0  Copy design 0 0 0  Total score 20 20 20      PLEASE NOTE: A Mini-Cog screen was completed. Maximum score is 20. A value of 0 denotes this part of [place] MMSE was not completed or the patient failed this part of the Mini-Cog screening.   Mini-Cog Screening Orientation to Time - Max 5 pts Orientation to Place - Max 5 pts Registration - Max 3 pts Recall - Max 3 pts Language Repeat - Max 1 pts Language Follow 3 Step Command - Max 3 pts     Immunization History  Administered Date(s) Administered  . Influenza Split 08/02/2012  . Influenza,inj,Quad PF,6+ Mos 09/17/2013, 09/07/2014, 09/06/2016, 11/07/2017  . Influenza-Unspecified 09/28/2015, 07/23/2018  . Pneumococcal Conjugate-13 10/23/2013  . Pneumococcal-Unspecified 10/23/2012  . Td 10/23/2000  . Tdap 10/23/2012    Screening Tests Health Maintenance  Topic Date Due  . COLONOSCOPY  10/23/2019 (Originally 09/13/2018)  . FOOT EXAM  06/22/2019  . HEMOGLOBIN A1C  06/29/2019  . OPHTHALMOLOGY EXAM  09/23/2019  . DTaP/Tdap/Td (2 - Td) 10/23/2022  . TETANUS/TDAP  10/23/2022  . INFLUENZA VACCINE  Completed  . Hepatitis C Screening  Completed  . PNA vac Low Risk Adult  Completed        Plan:     I have personally reviewed, addressed, and noted the following in the patient's chart:  A. Medical and social history B. Use of alcohol, tobacco or illicit drugs  C. Current medications and supplements D. Functional ability and status E.  Nutritional status F.  Physical activity G. Advance directives H. List of other physicians I.  Hospitalizations, surgeries, and ER visits in previous 12 months J.  [place] to include hearing, vision, cognitive, depression L. Referrals and appointments - none  In addition, I have reviewed and discussed with patient certain preventive protocols, quality metrics, and best practice recommendations. A written personalized care plan for preventive services as well as general preventive health recommendations were provided to patient.  See attached scanned questionnaire for additional information.   Signed,   Lindell Noe, MHA, BS, LPN Health Coach

## 2018-12-30 ENCOUNTER — Encounter: Payer: Self-pay | Admitting: Family Medicine

## 2018-12-30 NOTE — Progress Notes (Signed)
BP 136/72 (BP Location: Left Arm, Patient Position: Sitting, Cuff Size: Large)   Pulse 62   Temp 97.8 F (36.6 C) (Oral)   Ht 5' 8.5" (1.74 m)   Wt 237 lb 6 oz (107.7 kg)   SpO2 94%   BMI 35.57 kg/m    CC: CPE Subjective:    Patient ID: Evan Avery, male    DOB: Oct 09, 1946, 73 y.o.   MRN: [id number]  HPI: Evan Avery is a 73 y.o. male presenting on 12/31/2018 for Annual Exam (Pt 2. )    Saw Katha Cabal last week for medicare wellness visit. Note reviewed.    Off januvia - due to expense up to $400/month 09/2018. Continues metformin and amaryl.   Preventative: COLONOSCOPY 11/2017through colostomy -TAx5, sessile serrated polyp, diverticulosis, rpt 2 yrs (Outlaw).  Prostate cancer screening - discussed, will continue PSA screen. Lung cancer screening - discussed, agrees to screen.  Flu shotyearly  Tdap 2014 Pneumovax 2014, prevnar 2015  shingrix - discussed, declines Advanced directive discussion - does not want prolonged life support. Would want wife to be HCPOA. Packet provided previously. Seat belt use discussed  Sunscreen use discussed, no changing moles on skin. Ex smoker - quit 04/2016 - restarted 1 ppd. 50+ PY Alcohol - rare Dentist q6 mo - upper dentures Eye exam Q3-4 mo through [place]  Lives with wife and son (with mental issues) Occupation: retired, was Multimedia programmer for [employer] Activity: no regular exercise due to foot pain Diet: good water, fruits/vegetables daily      Relevant past medical, surgical, family and social history reviewed and updated as indicated. Interim medical history since our last visit reviewed. Allergies and medications reviewed and updated. Outpatient Medications Prior to Visit  Medication Sig Dispense Refill  . aspirin 81 MG tablet Take 81 mg by mouth daily.      Marland Kitchen atenolol (TENORMIN) 50 MG tablet TAKE 1 & 1/2 (ONE & ONE-HALF) TABLETS BY MOUTH ONCE DAILY 135 tablet 1  . atorvastatin (LIPITOR) 40 MG tablet TAKE 1  TABLET BY MOUTH ONCE DAILY AT  6PM 90 tablet 3  . Brinzolamide-Brimonidine (SIMBRINZA) 1-0.2 % SUSP Apply 1 drop to eye 3 (three) times daily.     . clopidogrel (PLAVIX) 75 MG tablet TAKE 1 TABLET BY MOUTH ONCE DAILY 90 tablet 1  . ibuprofen (ADVIL,MOTRIN) 200 MG tablet Take 600 mg by mouth every 6 (six) hours as needed for mild pain.    Marland Kitchen latanoprost (XALATAN) 0.005 % ophthalmic solution Place 1 drop into both eyes at bedtime.     Marland Kitchen lisinopril (PRINIVIL,ZESTRIL) 5 MG tablet TAKE 1 TABLET BY MOUTH ONCE DAILY 90 tablet 2  . metFORMIN (GLUCOPHAGE) 1000 MG tablet TAKE 1 TABLET BY MOUTH TWICE DAILY WITH MEALS 180 tablet 3  . omega-3 acid ethyl esters (LOVAZA) 1 g capsule TAKE 2 CAPSULES BY MOUTH TWICE DAILY 360 capsule 0  . senna-docusate (SENOKOT-S) 8.6-50 MG tablet Take 1 tablet by mouth at bedtime as needed for mild constipation. 15 tablet 0  . glimepiride (AMARYL) 4 MG tablet TAKE 1 TABLET BY MOUTH ONCE DAILY WITH  BREAKFAST 90 tablet 1  . ibuprofen (ADVIL,MOTRIN) 800 MG tablet Take 1 tablet (800 mg total) by mouth every 8 (eight) hours as needed. 30 tablet 0   No facility-administered medications prior to visit.      Per HPI unless specifically indicated in ROS section below Review of Systems  Constitutional: Negative for activity change, appetite change, chills, fatigue, fever and  unexpected weight change.  HENT: Negative for hearing loss.   Eyes: Negative for visual disturbance.  Respiratory: Positive for cough. Negative for chest tightness, shortness of breath and wheezing.   Cardiovascular: Negative for chest pain, palpitations and leg swelling.  Gastrointestinal: Negative for abdominal distention, abdominal pain, blood in stool, constipation, diarrhea, nausea and vomiting.  Genitourinary: Negative for difficulty urinating and hematuria.  Musculoskeletal: Negative for arthralgias, myalgias and neck pain.  Skin: Negative for rash.  Neurological: Negative for dizziness, seizures, syncope  and headaches.  Hematological: Negative for adenopathy. Bruises/bleeds easily.  Psychiatric/Behavioral: Negative for dysphoric mood. The patient is not nervous/anxious.    Objective:    BP 136/72 (BP Location: Left Arm, Patient Position: Sitting, Cuff Size: Large)   Pulse 62   Temp 97.8 F (36.6 C) (Oral)   Ht 5' 8.5" (1.74 m)   Wt 237 lb 6 oz (107.7 kg)   SpO2 94%   BMI 35.57 kg/m   Wt Readings from Last 3 Encounters:  12/31/18 237 lb 6 oz (107.7 kg)  12/27/18 235 lb (106.6 kg)  12/16/18 238 lb 9.6 oz (108.2 kg)    Physical Exam Vitals signs and nursing note reviewed.  Constitutional:      General: He is not in acute distress.    Appearance: Normal appearance. He is well-developed.  HENT:     Head: Normocephalic and atraumatic.     Right Ear: Hearing, tympanic membrane, ear canal and external ear normal.     Left Ear: Hearing, tympanic membrane, ear canal and external ear normal.     Nose: Nose normal.     Mouth/Throat:     Mouth: Mucous membranes are moist.     Pharynx: Uvula midline. No oropharyngeal exudate or posterior oropharyngeal erythema.  Eyes:     General: No scleral icterus.    Conjunctiva/sclera: Conjunctivae normal.     Pupils: Pupils are equal, round, and reactive to light.  Neck:     Musculoskeletal: Normal range of motion and neck supple.  Cardiovascular:     Rate and Rhythm: Normal rate and regular rhythm.     Pulses: Normal pulses.          Radial pulses are 2+ on the right side and 2+ on the left side.     Heart sounds: Normal heart sounds. No murmur.  Pulmonary:     Effort: Pulmonary effort is normal. No respiratory distress.     Breath sounds: Normal breath sounds. No wheezing, rhonchi or rales.  Abdominal:     General: Bowel sounds are normal. There is no distension.     Palpations: Abdomen is soft. There is no mass.     Tenderness: There is no abdominal tenderness. There is no guarding or rebound.     Hernia: A hernia is present.      Comments: Incisions present  Musculoskeletal: Normal range of motion.  Lymphadenopathy:     Cervical: No cervical adenopathy.  Skin:    General: Skin is warm and dry.     Findings: No rash.  Neurological:     Mental Status: He is alert and oriented to person, place, and time.     Comments: CN grossly intact, station and gait intact  Psychiatric:        Mood and Affect: Mood normal.        Behavior: Behavior normal.        Thought Content: Thought content normal.        Judgment: Judgment normal.  Results for orders placed or performed in visit on 12/27/18  PSA, Medicare ([place])  Result Value Ref Range   PSA 0.64 0.10 - 4.00 ng/ml  Hemoglobin A1c  Result Value Ref Range   Hgb A1c MFr Bld 8.8 (H) 4.6 - 6.5 %  Lipid Panel  Result Value Ref Range   Cholesterol 138 0 - 200 mg/dL   Triglycerides 279.0 (H) 0.0 - 149.0 mg/dL   HDL 29.70 (L) >39.00 mg/dL   VLDL 55.8 (H) 0.0 - 40.0 mg/dL   Total CHOL/HDL Ratio 5    NonHDL 108.12   LDL Cholesterol, Direct  Result Value Ref Range   Direct LDL 61.0 mg/dL  Comprehensive metabolic panel  Result Value Ref Range   Sodium 138 135 - 145 mEq/L   Potassium 5.2 (H) 3.5 - 5.1 mEq/L   Chloride 103 96 - 112 mEq/L   CO2 29 19 - 32 mEq/L   Glucose, Bld 151 (H) 70 - 99 mg/dL   BUN 15 6 - 23 mg/dL   Creatinine, Ser 1.12 0.40 - 1.50 mg/dL   Total Bilirubin 0.5 0.2 - 1.2 mg/dL   Alkaline Phosphatase 68 39 - 117 U/L   AST 14 0 - 37 U/L   ALT 23 0 - 53 U/L   Total Protein 6.8 6.0 - 8.3 g/dL   Albumin 4.6 3.5 - 5.2 g/dL   Calcium 9.9 8.4 - 10.5 mg/dL   GFR 64.20 >60.00 mL/min   Assessment & Plan:   Problem List Items Addressed This Visit    Smoker    Again discussed lung cancer screening - agrees to re referral.       Relevant Orders   Ambulatory Referral for Lung Cancer Scre   Severe obesity (BMI 35.0-39.9) with comorbidity ([place])    Encouraged healthy diet and lifestyle changes to affect sustainable weight loss.        Relevant Medications   glimepiride (AMARYL) 4 MG tablet   Hyperlipidemia with target LDL less than 70    LDL at goal. Trig elevated. Will price out vascepa in place of lovaza. Continue lipitor. The ASCVD Risk score Mikey Bussing DC Jr., et al., 2013) failed to calculate for the following reasons:   The patient has a prior MI or stroke diagnosis       Relevant Medications   Icosapent Ethyl (VASCEPA) 1 g CAPS   Health maintenance examination - Primary    Preventative protocols reviewed and updated unless pt declined. Discussed healthy diet and lifestyle.       Glaucoma    Followed by [facility].       Essential hypertension    Chronic, stable. Continue current regimen.       Relevant Medications   Icosapent Ethyl (VASCEPA) 1 g CAPS   Diabetes mellitus type 2, uncontrolled, with complications ([place])    januvia unaffordable once he was in donut hole last year. Discussed weekly GLP1 RA. For now, continue metformin, amaryl, renew efforts at following diabetic diet. RTC 3 mo DM f/u visit.       Relevant Medications   glimepiride (AMARYL) 4 MG tablet   Advanced care planning/counseling discussion    Advanced directive discussion - does not want prolonged life support. Would want wife to be HCPOA. Packet provided previously  - has this at home.          Meds ordered this encounter  Medications  . cetirizine (ZYRTEC) 10 MG tablet    Sig: Take 1 tablet (10 mg total) by mouth  daily.    Dispense:  90 tablet    Refill:  3  . fluticasone (FLONASE) 50 MCG/ACT nasal spray    Sig: Place 2 sprays into both nostrils daily.    Dispense:  48 g    Refill:  3  . Icosapent Ethyl (VASCEPA) 1 g CAPS    Sig: Take 1 capsule (1 g total) by mouth 2 (two) times daily.    Dispense:  60 capsule    Refill:  6  . glimepiride (AMARYL) 4 MG tablet    Sig: TAKE 1 TABLET BY MOUTH ONCE DAILY WITH  BREAKFAST    Dispense:  90 tablet    Refill:  3   Orders Placed This Encounter  Procedures  . Ambulatory Referral for  Lung Cancer Scre    Referral Priority:   Routine    Referral Type:   Consultation    Referral Reason:   Specialty Services Required    Number of Visits Requested:   1    Patient instructions: Call to schedule follow up colonoscopy.  If interested, check with pharmacy about new 2 shot shingles series (shingrix).  We will refer you for lung cancer screening CT.  Keep working on setting up advanced directive. Price out vascepa 1 pill twice daily in place of lovaza. Full dose will be 2 pills twice daily  Return in 4 months for diabetes follow up visit Work on low sugar low carb diabetic diet. Consider once weekly shot like ozempic or trulicity.   Follow up plan: Return in about 3 months (around 04/02/2019) for follow up visit.  [name], MD

## 2018-12-31 ENCOUNTER — Telehealth: Payer: Self-pay | Admitting: *Deleted

## 2018-12-31 ENCOUNTER — Ambulatory Visit (INDEPENDENT_AMBULATORY_CARE_PROVIDER_SITE_OTHER): Payer: PPO | Admitting: Family Medicine

## 2018-12-31 ENCOUNTER — Encounter: Payer: Self-pay | Admitting: Family Medicine

## 2018-12-31 ENCOUNTER — Ambulatory Visit: Payer: PPO

## 2018-12-31 VITALS — BP 136/72 | HR 62 | Temp 97.8°F | Ht 68.5 in | Wt 237.4 lb

## 2018-12-31 DIAGNOSIS — Z7189 Other specified counseling: Secondary | ICD-10-CM

## 2018-12-31 DIAGNOSIS — H409 Unspecified glaucoma: Secondary | ICD-10-CM

## 2018-12-31 DIAGNOSIS — Z87891 Personal history of nicotine dependence: Secondary | ICD-10-CM

## 2018-12-31 DIAGNOSIS — I10 Essential (primary) hypertension: Secondary | ICD-10-CM

## 2018-12-31 DIAGNOSIS — F17200 Nicotine dependence, unspecified, uncomplicated: Secondary | ICD-10-CM

## 2018-12-31 DIAGNOSIS — E118 Type 2 diabetes mellitus with unspecified complications: Secondary | ICD-10-CM | POA: Diagnosis not present

## 2018-12-31 DIAGNOSIS — E785 Hyperlipidemia, unspecified: Secondary | ICD-10-CM

## 2018-12-31 DIAGNOSIS — Z0000 Encounter for general adult medical examination without abnormal findings: Secondary | ICD-10-CM | POA: Diagnosis not present

## 2018-12-31 DIAGNOSIS — IMO0002 Reserved for concepts with insufficient information to code with codable children: Secondary | ICD-10-CM

## 2018-12-31 DIAGNOSIS — Z122 Encounter for screening for malignant neoplasm of respiratory organs: Secondary | ICD-10-CM

## 2018-12-31 DIAGNOSIS — E1165 Type 2 diabetes mellitus with hyperglycemia: Secondary | ICD-10-CM

## 2018-12-31 MED ORDER — CETIRIZINE HCL 10 MG PO TABS: 10.0000 mg | ORAL_TABLET | Freq: Every day | ORAL | 3 refills | Status: DC

## 2018-12-31 MED ORDER — FLUTICASONE PROPIONATE 50 MCG/ACT NA SUSP: 2.0000 | Freq: Every day | NASAL | 3 refills | Status: DC

## 2018-12-31 MED ORDER — GLIMEPIRIDE 4 MG PO TABS: ORAL_TABLET | ORAL | 3 refills | Status: DC

## 2018-12-31 MED ORDER — ICOSAPENT ETHYL 1 G PO CAPS: 1.0000 | ORAL_CAPSULE | Freq: Two times a day (BID) | ORAL | 6 refills | Status: DC

## 2018-12-31 NOTE — Assessment & Plan Note (Signed)
januvia unaffordable once he was in donut hole last year. Discussed weekly GLP1 RA. For now, continue metformin, amaryl, renew efforts at following diabetic diet. RTC 3 mo DM f/u visit.

## 2018-12-31 NOTE — Patient Instructions (Addendum)
Call to schedule follow up colonoscopy.  If interested, check with pharmacy about new 2 shot shingles series (shingrix).  We will refer you for lung cancer screening CT.  Keep working on setting up advanced directive. Price out vascepa 1 pill twice daily in place of lovaza. Full dose will be 2 pills twice daily  Return in 4 months for diabetes follow up visit Work on low sugar low carb diabetic diet. Consider once weekly shot like ozempic or trulicity.   Health Maintenance After Age 64 After age 42, you are at a higher risk for certain long-term diseases and infections as well as injuries from falls. Falls are a major cause of broken bones and head injuries in people who are older than age 78. Getting regular preventive care can help to keep you healthy and well. Preventive care includes getting regular testing and making lifestyle changes as recommended by your health care provider. Talk with your health care provider about:  Which screenings and tests you should have. A screening is a test that checks for a disease when you have no symptoms.  A diet and exercise plan that is right for you. What should I know about screenings and tests to prevent falls? Screening and testing are the best ways to find a health problem early. Early diagnosis and treatment give you the best chance of managing medical conditions that are common after age 33. Certain conditions and lifestyle choices may make you more likely to have a fall. Your health care provider may recommend:  Regular vision checks. Poor vision and conditions such as cataracts can make you more likely to have a fall. If you wear glasses, make sure to get your prescription updated if your vision changes.  Medicine review. Work with your health care provider to regularly review all of the medicines you are taking, including over-the-counter medicines. Ask your health care provider about any side effects that may make you more likely to have a fall.  Tell your health care provider if any medicines that you take make you feel dizzy or sleepy.  Osteoporosis screening. Osteoporosis is a condition that causes the bones to get weaker. This can make the bones weak and cause them to break more easily.  Blood pressure screening. Blood pressure changes and medicines to control blood pressure can make you feel dizzy.  Strength and balance checks. Your health care provider may recommend certain tests to check your strength and balance while standing, walking, or changing positions.  Foot health exam. Foot pain and numbness, as well as not wearing proper footwear, can make you more likely to have a fall.  Depression screening. You may be more likely to have a fall if you have a fear of falling, feel emotionally low, or feel unable to do activities that you used to do.  Alcohol use screening. Using too much alcohol can affect your balance and may make you more likely to have a fall. What actions can I take to lower my risk of falls? General instructions  Talk with your health care provider about your risks for falling. Tell your health care provider if: ? You fall. Be sure to tell your health care provider about all falls, even ones that seem minor. ? You feel dizzy, sleepy, or off-balance.  Take over-the-counter and prescription medicines only as told by your health care provider. These include any supplements.  Eat a healthy diet and maintain a healthy weight. A healthy diet includes low-fat dairy products, low-fat (lean) meats,  and fiber from whole grains, beans, and lots of fruits and vegetables. Home safety  Remove any tripping hazards, such as rugs, cords, and clutter.  Install safety equipment such as grab bars in bathrooms and safety rails on stairs.  Keep rooms and walkways well-lit. Activity   Follow a regular exercise program to stay fit. This will help you maintain your balance. Ask your health care provider what types of exercise  are appropriate for you.  If you need a cane or walker, use it as recommended by your health care provider.  Wear supportive shoes that have nonskid soles. Lifestyle  Do not drink alcohol if your health care provider tells you not to drink.  If you drink alcohol, limit how much you have: ? 0-1 drink a day for women. ? 0-2 drinks a day for men.  Be aware of how much alcohol is in your drink. In the [place] one drink equals one typical bottle of beer (12 oz), one-half glass of wine (5 oz), or one shot of hard liquor (1 oz).  Do not use any products that contain nicotine or tobacco, such as cigarettes and e-cigarettes. If you need help quitting, ask your health care provider. Summary  Having a healthy lifestyle and getting preventive care can help to protect your health and wellness after age 23.  Screening and testing are the best way to find a health problem early and help you avoid having a fall. Early diagnosis and treatment give you the best chance for managing medical conditions that are more common for people who are older than age 36.  Falls are a major cause of broken bones and head injuries in people who are older than age 65. Take precautions to prevent a fall at home.  Work with your health care provider to learn what changes you can make to improve your health and wellness and to prevent falls. This information is not intended to replace advice given to you by your health care provider. Make sure you discuss any questions you have with your health care provider. Document Released: 08/22/2017 Document Revised: 08/22/2017 Document Reviewed: 08/22/2017 Elsevier Interactive Patient Education  2019 [employer].

## 2018-12-31 NOTE — Assessment & Plan Note (Signed)
Advanced directive discussion - does not want prolonged life support. Would want wife to be HCPOA. Packet provided previously - has this at home.

## 2018-12-31 NOTE — Assessment & Plan Note (Addendum)
LDL at goal. Trig elevated. Will price out vascepa in place of lovaza. Continue lipitor. The ASCVD Risk score Evan Bussing DC Jr., et al., 2013) failed to calculate for the following reasons:   The patient has a prior MI or stroke diagnosis

## 2018-12-31 NOTE — Assessment & Plan Note (Signed)
Encouraged healthy diet and lifestyle changes to affect sustainable weight loss.  

## 2018-12-31 NOTE — Telephone Encounter (Signed)
Received referral for initial lung cancer screening scan. Contacted patient and obtained smoking history,(current, 50 pack year) as well as answering questions related to screening process. Patient denies signs of lung cancer such as weight loss or hemoptysis. Patient denies comorbidity that would prevent curative treatment if lung cancer were found. Patient is scheduled for shared decision making visit and CT scan on 01/21/19 at 215pm.

## 2018-12-31 NOTE — Assessment & Plan Note (Signed)
Chronic, stable. Continue current regimen. 

## 2018-12-31 NOTE — Assessment & Plan Note (Signed)
Preventative protocols reviewed and updated unless pt declined. Discussed healthy diet and lifestyle.  

## 2018-12-31 NOTE — Assessment & Plan Note (Signed)
[facility].  

## 2018-12-31 NOTE — Assessment & Plan Note (Signed)
Again discussed lung cancer screening - agrees to re referral.

## 2019-01-07 ENCOUNTER — Other Ambulatory Visit: Payer: Self-pay | Admitting: Family Medicine

## 2019-01-13 ENCOUNTER — Telehealth: Payer: Self-pay | Admitting: *Deleted

## 2019-01-13 NOTE — Telephone Encounter (Signed)
Patient notified/message left to notify patient that due to current restrictions lung screening appointments are cancelled and patient will be contacted regarding rescheduling.  

## 2019-01-21 ENCOUNTER — Inpatient Hospital Stay: Payer: PPO | Admitting: Oncology

## 2019-01-21 ENCOUNTER — Ambulatory Visit: Payer: PPO

## 2019-02-12 LAB — HEMOGLOBIN A1C: Hemoglobin A1C: 8.4

## 2019-02-18 LAB — VITAMIN B12: Vitamin B-12: 634

## 2019-02-18 LAB — CBC AND DIFFERENTIAL
Hemoglobin: 13.2 — AB (ref 13.5–17.5)
Platelets: 147 — AB (ref 150–399)
WBC: 7.2

## 2019-02-18 LAB — LIPID PANEL
Cholesterol: 124 (ref 0–200)
HDL: 30 — AB (ref 35–70)
LDL Cholesterol: 75
Triglycerides: 258 — AB (ref 40–160)

## 2019-02-18 LAB — BASIC METABOLIC PANEL
Creatinine: 1.1 (ref 0.6–1.3)
Potassium: 4.7 (ref 3.4–5.3)

## 2019-02-18 LAB — IRON,TIBC AND FERRITIN PANEL: Ferritin: 38.9

## 2019-02-18 LAB — HEPATIC FUNCTION PANEL
ALT: 30 (ref 10–40)
AST: 15 (ref 14–40)
Alkaline Phosphatase: 71 (ref 25–125)
Bilirubin, Total: 0.5

## 2019-02-18 LAB — TSH: TSH: 1.39 (ref 0.41–5.90)

## 2019-02-20 ENCOUNTER — Other Ambulatory Visit: Payer: Self-pay | Admitting: Family Medicine

## 2019-03-13 ENCOUNTER — Telehealth: Payer: Self-pay | Admitting: *Deleted

## 2019-03-13 NOTE — Telephone Encounter (Signed)
Left voicemail in attempt to reschedule lung screening scan .

## 2019-03-18 NOTE — Progress Notes (Signed)
I reviewed health advisor's note, was available for consultation, and agree with documentation and plan.  

## 2019-03-29 ENCOUNTER — Other Ambulatory Visit: Payer: Self-pay | Admitting: Family Medicine

## 2019-04-01 ENCOUNTER — Telehealth: Payer: Self-pay | Admitting: Family Medicine

## 2019-04-01 ENCOUNTER — Encounter: Payer: Self-pay | Admitting: Family Medicine

## 2019-04-01 NOTE — Telephone Encounter (Signed)
Noted. Placed in Lisa's box.  plz call patient to schedule diabetes f/u in the next month to review further treatment plan for uncontrolled diabetes. It looks like he cancelled this month's appt.  Also need to f/u on cholesterol med changes.

## 2019-04-01 NOTE — Telephone Encounter (Signed)
Patient dropped off his lab results from the [place].  Lab results are in the rx tower.

## 2019-04-02 NOTE — Telephone Encounter (Signed)
Left message on vm per dpr asking pt to call back to schedule a virtual visit next month for DM and chol f/u per Dr. Coralie Keens Dr. Synthia Innocent message below.]

## 2019-04-03 ENCOUNTER — Ambulatory Visit: Payer: PPO | Admitting: Family Medicine

## 2019-04-03 NOTE — Telephone Encounter (Signed)
Pt scheduled 04/30/19 @ 10am

## 2019-04-04 ENCOUNTER — Telehealth: Payer: Self-pay | Admitting: *Deleted

## 2019-04-04 NOTE — Telephone Encounter (Signed)
Patient is rescheduled for lung screening scan. See prior documentation for eligibility notes.

## 2019-04-08 ENCOUNTER — Other Ambulatory Visit: Payer: Self-pay

## 2019-04-08 ENCOUNTER — Ambulatory Visit
Admission: RE | Admit: 2019-04-08 | Discharge: 2019-04-08 | Disposition: A | Discharge status: Home / Self Care | Payer: PPO | Source: Ambulatory Visit | Attending: Oncology | Admitting: Oncology

## 2019-04-08 ENCOUNTER — Inpatient Hospital Stay: Payer: PPO | Attending: Nurse Practitioner | Admitting: Oncology

## 2019-04-08 DIAGNOSIS — Z87891 Personal history of nicotine dependence: Secondary | ICD-10-CM | POA: Diagnosis not present

## 2019-04-08 DIAGNOSIS — Z122 Encounter for screening for malignant neoplasm of respiratory organs: Secondary | ICD-10-CM | POA: Insufficient documentation

## 2019-04-08 DIAGNOSIS — F17210 Nicotine dependence, cigarettes, uncomplicated: Secondary | ICD-10-CM | POA: Diagnosis not present

## 2019-04-09 ENCOUNTER — Telehealth: Payer: Self-pay | Admitting: *Deleted

## 2019-04-09 NOTE — Telephone Encounter (Signed)
Notified patient of LDCT lung cancer screening program results with recommendation for 12 month follow up imaging. Also notified of incidental findings noted below and is encouraged to discuss further with PCP who will receive a copy of this note and/or the CT report. Patient verbalizes understanding.   IMPRESSION: 1. Lung-RADS 2, benign appearance or behavior. Continue annual screening with low-dose chest CT without contrast in 12 months. 2. 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 3. Left adrenal adenoma. 4. Cholelithiasis.  Aortic Atherosclerosis (ICD10-I70.0) and Emphysema (ICD10-J43.9).

## 2019-04-09 NOTE — Progress Notes (Signed)
Virtual Visit via Video Note  I connected with Evan Avery on 04/09/19 at  1:30 PM EDT by a video enabled telemedicine application and verified that I am speaking with the correct person using two identifiers.  Location: Patient: [facility] Provider: Home   I discussed the limitations of evaluation and management by telemedicine and the availability of in person appointments. The patient expressed understanding and agreed to proceed.  I discussed the assessment and treatment plan with the patient. The patient was provided an opportunity to ask questions and all were answered. The patient agreed with the plan and demonstrated an understanding of the instructions.   The patient was advised to call back or seek an in-person evaluation if the symptoms worsen or if the condition fails to improve as anticipated.   In accordance with CMS guidelines, patient has met eligibility criteria including age, absence of signs or symptoms of lung cancer.  Social History   Tobacco Use  . Smoking status: Current Every Day Smoker    Packs/day: 1.00    Years: 50.00    Pack years: 50.00    Types: Cigarettes    Start date: 10/23/1958  . Smokeless tobacco: Never Used  Substance Use Topics  . Alcohol use: Yes    Alcohol/week: 0.0 standard drinks    Comment: rarely  . Drug use: No     A shared decision-making session was conducted prior to the performance of CT scan. This includes one or more decision aids, includes benefits and harms of screening, follow-up diagnostic testing, over-diagnosis, false positive rate, and total radiation exposure.   Counseling on the importance of adherence to annual lung cancer LDCT screening, impact of co-morbidities, and ability or willingness to undergo diagnosis and treatment is imperative for compliance of the program.   Counseling on the importance of continued smoking cessation for former smokers; the importance of smoking cessation for current smokers, and information about  tobacco cessation interventions have been given to patient including [place] and [place] [facility] [place] programs.   Written order for lung cancer screening with LDCT has been given to the patient and any and all questions have been answered to the best of my abilities.    Yearly follow up will be coordinated by Burgess Estelle, Thoracic Navigator.  I provided 10 minutes of face-to-face video visit time during this encounter, and > 50% was spent counseling as documented under my assessment & plan.   Jacquelin Hawking, NP

## 2019-04-30 ENCOUNTER — Encounter: Payer: Self-pay | Admitting: Family Medicine

## 2019-04-30 ENCOUNTER — Ambulatory Visit (INDEPENDENT_AMBULATORY_CARE_PROVIDER_SITE_OTHER): Payer: PPO | Admitting: Family Medicine

## 2019-04-30 VITALS — BP 142/71 | HR 56 | Temp 97.2°F | Ht 68.5 in | Wt 235.0 lb

## 2019-04-30 DIAGNOSIS — E1165 Type 2 diabetes mellitus with hyperglycemia: Secondary | ICD-10-CM

## 2019-04-30 DIAGNOSIS — E785 Hyperlipidemia, unspecified: Secondary | ICD-10-CM | POA: Diagnosis not present

## 2019-04-30 DIAGNOSIS — E118 Type 2 diabetes mellitus with unspecified complications: Secondary | ICD-10-CM

## 2019-04-30 DIAGNOSIS — IMO0002 Reserved for concepts with insufficient information to code with codable children: Secondary | ICD-10-CM

## 2019-04-30 MED ORDER — LISINOPRIL 5 MG PO TABS: 5.0000 mg | ORAL_TABLET | Freq: Every day | ORAL | 2 refills | Status: DC

## 2019-04-30 MED ORDER — OZEMPIC (0.25 OR 0.5 MG/DOSE) 2 MG/1.5ML ~~LOC~~ SOPN: PEN_INJECTOR | SUBCUTANEOUS | 6 refills | Status: AC

## 2019-04-30 NOTE — Assessment & Plan Note (Signed)
vascepa unaffordable. Will continue lipitor and lovaza.

## 2019-04-30 NOTE — Progress Notes (Signed)
   Evan Avery - 73 y.o. male  MRN [id number]  Date of Birth: 1946-01-23  PCP: [name], MD  This service was provided via telemedicine. Phone Visit performed on 04/30/2019    Patient identified using name and birth date. Rationale for phone visit along with limitations reviewed. Patient consented to telephone encounter.    Location of patient: home, on front porch Location of provider: in office, [place] [facility] Name of referring provider: N/A   Names of persons and role in encounter: Provider: [name], MD  Patient: Evan Avery  Other: N/A   Time on call: 10:00am - 10:19am   Subjective: 3 month diabetes follow up visit  Chief Complaint  Patient presents with  . Diabetes    F/u.   HPI:  Evan Avery was unaffordable ($180/mo). He continues lovaza and lipitor.   DM - does not regularly check sugars. Compliant with antihyperglycemic regimen which includes: metformin 1000mg  bid, glimepiride 4mg  daily. januvia unaffordable once he was in donut hole. Seldom hypoglycemic symptoms. Denies paresthesias. Last diabetic eye exam 09/2018. Pneumovax: presumed 2014. Prevnar: 2015. Glucometer brand: Precision extra through the [place]. DSME: has not completed, declines. States A1c 8.4% (02/18/2019) through the [place].  Lab Results  Component Value Date   HGBA1C 8.4 02/12/2019   Diabetic Foot Exam - Simple   No data filed     Lab Results  Component Value Date   MICROALBUR 0.2 12/27/2007      Objective/Observations:  No physical exam or vital signs collected unless specifically identified below.   BP (!) 142/71 (BP Location: Right Arm)   Pulse (!) 56   Temp (!) 97.2 F (36.2 C)   Ht 5' 8.5" (1.74 m)   Wt 235 lb (106.6 kg)   BMI 35.21 kg/m    Respiratory status: speaks in complete sentences without evident shortness of breath.   Assessment/Plan:  Hyperlipidemia with target LDL less than 70 vascepa unaffordable. Will continue lipitor and lovaza.    Diabetes mellitus type 2, uncontrolled, with complications (HCC) Chronic, uncontrolled but improved.  Will start ozempic 0.25mg  weekly for 2 wks then increase to 0.5mg  weekly - sent to pharmacy for patient to price out. Evan Avery was previously tolerated but became unaffordable. Reassess at 4 mo f/u visit.  No known fmhx thyroid disease or cancer.   I discussed the assessment and treatment plan with the patient. The patient was provided an opportunity to ask questions and all were answered. The patient agreed with the plan and demonstrated an understanding of the instructions.   Lab Orders     Hemoglobin A1c  Meds ordered this encounter  Medications  . lisinopril (ZESTRIL) 5 MG tablet    Sig: Take 1 tablet (5 mg total) by mouth daily.    Dispense:  90 tablet    Refill:  2  . Semaglutide,0.25 or 0.5MG /DOS, (OZEMPIC, 0.25 OR 0.5 MG/DOSE,) 2 MG/1.5ML SOPN    Sig: Inject 0.25 mg into the skin once a week for 14 days, THEN 0.5 mg once a week.    Dispense:  1 pen    Refill:  6    The patient was advised to call back or seek an in-person evaluation if the symptoms worsen or if the condition fails to improve as anticipated.  [name], MD

## 2019-04-30 NOTE — Assessment & Plan Note (Addendum)
Chronic, uncontrolled but improved.  Will start ozempic 0.25mg  weekly for 2 wks then increase to 0.5mg  weekly - sent to pharmacy for patient to price out. [name] was previously tolerated but became unaffordable. Reassess at 4 mo f/u visit.  No known fmhx thyroid disease or cancer.

## 2019-05-18 ENCOUNTER — Encounter: Payer: Self-pay | Admitting: Family Medicine

## 2019-05-18 DIAGNOSIS — I700 Atherosclerosis of aorta: Secondary | ICD-10-CM | POA: Insufficient documentation

## 2019-05-18 DIAGNOSIS — J449 Chronic obstructive pulmonary disease, unspecified: Secondary | ICD-10-CM | POA: Insufficient documentation

## 2019-05-24 ENCOUNTER — Other Ambulatory Visit: Payer: Self-pay | Admitting: Family Medicine

## 2019-05-26 ENCOUNTER — Other Ambulatory Visit: Payer: Self-pay

## 2019-05-26 MED ORDER — OMEGA-3-ACID ETHYL ESTERS 1 G PO CAPS: 2.0000 | ORAL_CAPSULE | Freq: Two times a day (BID) | ORAL | 2 refills | Status: DC

## 2019-07-25 ENCOUNTER — Telehealth: Payer: Self-pay | Admitting: Cardiovascular Disease

## 2019-07-25 NOTE — Telephone Encounter (Signed)
SPOKE TO PATIENT. PATIENT STATES HE HAS FLEETING PAIN  THAT CAN LAST FOR 1 MIN TO 4-5 MIN.  THE  DISCOMFORT  IT OCCURS MOSTLY AT REST.  PATIENT DESCRIBE BURNING SENSATION AND PRESSURE THEN SHORTNESS OF BREATH. PATIENT HAS NOT USED ANY NTG. -PATIENT STATES HE DOES NOT HAVE ANY. NO DISCOMFORT AT PRESENT . A OFFERED APPOINTMENT FOR Tuesday  07/29/19 PATIENT DECLINED HE HAS AN APPOINTMENT WITH PRIMARY . RESCHEDULED FOR WED 10 /7/20   PATIENT VERBALIZED UNDERSTANDING  AND AWARE IF SYMPTOMS BECOME WORSE CALL 911  - GO TO ER

## 2019-07-25 NOTE — Telephone Encounter (Signed)
° °  Spouse calling to report chest pressure   Pt c/o of Chest Pain: STAT if CP now or developed within 24 hours  1. Are you having CP right now? no  2. Are you experiencing any other symptoms (ex. SOB, nausea, vomiting, sweating)? SOB  3. How long have you been experiencing CP? 6 weeks  4. Is your CP continuous or coming and going? Coming and going  5. Have you taken Nitroglycerin? no ?

## 2019-07-29 ENCOUNTER — Ambulatory Visit (INDEPENDENT_AMBULATORY_CARE_PROVIDER_SITE_OTHER): Payer: PPO

## 2019-07-29 DIAGNOSIS — Z23 Encounter for immunization: Secondary | ICD-10-CM | POA: Diagnosis not present

## 2019-07-29 NOTE — Progress Notes (Signed)
Cardiology Office Note   Date:  07/30/2019   ID:  Evan Avery, Hearl [birth year], MRN [id number]  PCP:  [name], MD  Cardiologist: Dr. [name]  No chief complaint on file.    History of Present Illness: Evan Avery is a 73 y.o. male who presents for ongoing assessment and management of coronary artery disease, with stents to his RCA in 1992, with additional stenting in 1999 (his for stent in the mid RCA between 2 previously placed stents, 3.5 x 28 mm non-DES Liberte stent.).  Other history includes hypertension, hyperlipidemia, diabetes on Januvia, a obesity, OSA for which he uses CPAP and is been compliant.  He also underwent surgery in July 2017 due to abscess in his colon requiring a colostomy for 6 months and then in January 2018 had a colostomy closure.  He had 2 resections for cysts on the back of his neck with recurrent infections in October 2018.  Patient also has a history of smoking a pack a day.  The patient had a repeat [place] study for preoperative assessment for additional surgery needed on his back and neck.  This was completed on April 23, 2018 revealing a small inferior defect which was low risk.  EF was 50% with basal inferior hypokinesis.  He was cleared for surgery.  On last office visit with Dr. [name] on 01/14/2019 and was stable from a cardiac standpoint.  He was to continue on dual platelet therapy with aspirin Plavix abuse multiple stents.  He was to follow with his PCP for ongoing management of diabetes.  He was to continue statin therapy with atorvastatin 40 mg in addition to Lovaza 2 capsules twice daily with a target LDL less than 70.  He presents the clinic today and states he has been having intermittent burning throat and chest discomfort for 6 to 7 weeks.  He states that he is also been having a lot of belching as well.  His chest discomfort presents at random times throughout the day.  He states that on 1 occasion he noticed the burning  sensation when he was doing yard work and sat down, the discomfort went away, so he went back to work and it did not return.  He states that the bouts last about 6 to 7 minutes and go away spontaneously.  He states that he continues to be active walking in the evenings and with his yard work.  Aside from the 1 instance these bouts of activity do not bring on chest discomfort.  He denies increased dyspnea with exertion.  He states that the symptoms are somewhat like his previous chest symptoms that he experienced in the 1990s with his coronary artery disease however, he is not having the consistent pressure that he did with those events.  He denies chest pain, increased shortness of breath, lower extremity edema, fatigue, palpitations, melena, hematuria, hemoptysis, diaphoresis, weakness, presyncope, syncope, orthopnea, and PND.   Past Medical History:  Diagnosis Date  . Agent orange exposure 1968  . Arthritis   . CAD (coronary artery disease)    4 stents in right artery  . Complex tear of medial meniscus of left knee as current injury 07/26/2012   [place]  . Diabetes mellitus    type 2  . Diverticulitis of colon with perforation 05/23/2016  . Glaucoma 2006   on drops, followed by [place]  . Hyperlipidemia   . Hypertension   . Intra-abdominal abscess ([place]) 05/16/2016  . LUMBAR RADICULOPATHY 07/18/2007   Qualifier:  Diagnosis of  By: Arnoldo Morale MD, Balinda Quails   . Myocardial infarction [facility])    1992  . Sleep apnea    uses CPAP regularly    Past Surgical History:  Procedure Laterality Date  . APPENDECTOMY  1958  . BOWEL RESECTION N/A 05/23/2016   Procedure: SMALL BOWEL RESECTION;  Surgeon: Jackolyn Confer, MD;  Location: [facility];  Service: General;  Laterality: N/A;  . CARDIAC CATHETERIZATION  08/11/2008   angiographically patent LAD diagonal, circumflex and multiple circumflex obtuse marginal branches from the L coronary artery; stenting of mid R coronary artery with 3.5 x 51mm Liberte non DES postdilated  w/ 4.0 DuraStent [name])  . CARDIOVASCULAR STRESS TEST  06/16/2011   R/P MV - moderate perfusion defect in basal inferoseptal, basal inferior, mid inferseptal and mid inferior regions, consistent with infarct/scar and/or diaphragmatic attenuation; non-gated study secondary to ectopy; no CP or EKG changes for ischemia; abnormal study although no significant changes from previous study; in the absense of gated images cannot calculate EF or distinguish scar/artifact  . COLONOSCOPY  08/2016   TAx5, sessile serrated polyp, diverticulosis, rpt 2 yrs ([place])  . COLOSTOMY Left 05/23/2016   Procedure: COLOSTOMY;  Surgeon: Jackolyn Confer, MD;  Location: [facility];  Service: General;  Laterality: Left;  . COLOSTOMY TAKEDOWN N/A 11/10/2016   Procedure: LAPAROSCOPIC LYSIS OF ADHESIONS AND  COLOSTOMY CLOSURE;  Surgeon: Jackolyn Confer, MD;  Location: [facility];  Service: General;  Laterality: N/A;  . CYST EXCISION  04/2018   posterior neck ([place])  . DOPPLER ECHOCARDIOGRAPHY  08/20/2008   EF 123456; LV systolic function normal; LV mildly dilated; doppler flow suggestive of impaired LV relaxation; RV mildly dilated, RV systolic function normal, RV systolic pressure normal  . INCISION AND DRAINAGE ABSCESS N/A 05/23/2016   Procedure: DRAINAGE OF MULTIPLE INTRA  ABDOMINAL ABSCESS;  Surgeon: Jackolyn Confer, MD;  Location: [facility];  Service: General;  Laterality: N/A;  . KNEE SURGERY Left 2015   torn meniscus [place] Ip)  . LAPAROSCOPIC LYSIS OF ADHESIONS  11/10/2016   Procedure: LAPAROSCOPIC LYSIS OF ADHESIONS;  Surgeon: Jackolyn Confer, MD;  Location: [facility];  Service: General;;  . LAPAROTOMY N/A 05/23/2016   Procedure: EXPLORATORY LAPAROTOMY;  Surgeon: Jackolyn Confer, MD;  Location: [facility];  Service: General;  Laterality: N/A;  . MASS EXCISION N/A 05/22/2018   Procedure: EXCISION OF CYST ON POSTERIOR NECK ERAS PATHWAY;  Surgeon: Erroll Luna, MD;  Location: [place];  Service: General;  Laterality:  N/A;  . PERCUTANEOUS CORONARY STENT INTERVENTION (PCI-S)  1992, 1999, 2004   4 vessels [name])  . PROCTOSCOPY  11/10/2016   Procedure: PROCTOSCOPY;  Surgeon: Jackolyn Confer, MD;  Location: [facility];  Service: General;;     Current Outpatient Medications  Medication Sig Dispense Refill  . aspirin 81 MG tablet Take 81 mg by mouth daily.      Marland Kitchen atenolol (TENORMIN) 50 MG tablet TAKE 1 & 1/2 (ONE & ONE-HALF) TABLETS BY MOUTH ONCE DAILY 135 tablet 2  . atorvastatin (LIPITOR) 40 MG tablet TAKE 1 TABLET BY MOUTH ONCE DAILY AT  6PM 90 tablet 3  . Brinzolamide-Brimonidine (SIMBRINZA) 1-0.2 % SUSP Apply 1 drop to eye 3 (three) times daily.     . cetirizine (ZYRTEC) 10 MG tablet Take 1 tablet (10 mg total) by mouth daily. 90 tablet 3  . clopidogrel (PLAVIX) 75 MG tablet Take 1 tablet by mouth once daily 90 tablet 2  . glimepiride (AMARYL) 4 MG tablet TAKE 1 TABLET  BY MOUTH ONCE DAILY WITH  BREAKFAST 90 tablet 3  . Glucosamine-Chondroit-Vit C-Mn (GLUCOSAMINE 1500 COMPLEX PO) Take 1,500 mg by mouth daily.    Marland Kitchen ibuprofen (ADVIL,MOTRIN) 200 MG tablet Take 600 mg by mouth every 6 (six) hours as needed for mild pain.    Marland Kitchen latanoprost (XALATAN) 0.005 % ophthalmic solution Place 1 drop into both eyes at bedtime.     Marland Kitchen lisinopril (ZESTRIL) 5 MG tablet Take 1 tablet (5 mg total) by mouth daily. 90 tablet 2  . metFORMIN (GLUCOPHAGE) 1000 MG tablet TAKE 1 TABLET BY MOUTH TWICE DAILY WITH MEALS 180 tablet 1  . omega-3 acid ethyl esters (LOVAZA) 1 g capsule Take 2 capsules (2 g total) by mouth 2 (two) times daily. 360 capsule 2  . senna-docusate (SENOKOT-S) 8.6-50 MG tablet Take 1 tablet by mouth at bedtime as needed for mild constipation. 15 tablet 0   No current facility-administered medications for this visit.     Allergies:   Dilaudid [hydromorphone hcl], Ambien [zolpidem], and Niacin and related    Social History:  The patient  reports that he has been smoking cigarettes. He started smoking about 60 years  ago. He has a 50.00 pack-year smoking history. He has never used smokeless tobacco. He reports current alcohol use. He reports that he does not use drugs.   Family History:  The patient's family history includes Alzheimer's disease in his mother; Cancer in his maternal grandfather; Diabetes in his brother and paternal grandfather; Heart attack in his brother; Heart disease in his father and maternal grandmother.    ROS: All other systems are reviewed and negative. Unless otherwise mentioned in H&P    PHYSICAL EXAM: VS:  BP 138/62   Pulse (!) 57   Temp (!) 97.3 F (36.3 C)   Ht 5' 8.5" (1.74 m)   Wt 235 lb (106.6 kg)   SpO2 97%   BMI 35.21 kg/m  , BMI Body mass index is 35.21 kg/m. GEN: Well nourished, well developed, in no acute distress HEENT: normal Neck: no JVD, carotid bruits, or masses Cardiac: Sinus bradycardia; no murmurs, rubs, or gallops,no edema  Respiratory:  Clear to auscultation bilaterally, normal work of breathing GI: soft, nontender, nondistended, + BS MS: no deformity or atrophy Skin: warm and dry, no rash Neuro:  Strength and sensation are intact Psych: euthymic mood, full affect   EKG:  EKG is ordered today. The ekg ordered today demonstrates sinus bradycardia 53 bpm-no acute changes  EKG 12/16/2018 Sinus bradycardia nonspecific T wave abnormality 51 bpm Recent Labs: 12/27/2018: BUN 15; Sodium 138 02/18/2019: ALT 30; Creatinine 1.1; Hemoglobin 13.2; Platelets 147; Potassium 4.7; TSH 1.39    Lipid Panel    Component Value Date/Time   CHOL 124 02/18/2019   CHOL 149 03/13/2016 0803   TRIG 258 (A) 02/18/2019   TRIG 387 (H) 03/13/2016 0803   HDL 30 (A) 02/18/2019   HDL 26 (L) 03/13/2016 0803   CHOLHDL 5 12/27/2018 1245   VLDL 55.8 (H) 12/27/2018 1245   LDLCALC 75 02/18/2019   LDLCALC 46 03/13/2016 0803   LDLDIRECT 61.0 12/27/2018 1245      Wt Readings from Last 3 Encounters:  07/30/19 235 lb (106.6 kg)  04/30/19 235 lb (106.6 kg)  04/08/19 237 lb  (107.5 kg)      Other studies Reviewed:  NM Study 04/23/2018  The left ventricular ejection fraction is mildly decreased (45-54%).  Nuclear stress EF: 50%.  No T wave inversion was noted during stress.  There was no ST segment deviation noted during stress.  Defect 1: There is a medium defect of moderate severity.  This is a low risk study.      ASSESSMENT AND PLAN:  GERD-has had burning throat and substernal chest discomfort for the last 6 to 7 weeks.  Episodes come on spontaneously throughout the day and last 5 to 6 minutes and dissipate without intervention.  No complaints of dyspnea on exertion or exertional chest pain. Order Protonix 40 mg tablet daily GERD diet-instructions given to avoid spicy food, citrus juices, and tomato based foods. Weight loss Increase elevation of head of bed Reassured patient that this is likely acid reflux and not related to his heart.  Essential hypertension-BP today 138/62.  Well-controlled at home Continue lisinopril 5 mg tablet daily Continue atenolol 75 mg daily Heart healthy low-sodium diet Increase physical activity as tolerated  Hyperlipidemia-12/27/2018: VLDL 55.8 02/18/2019: Cholesterol 124; HDL 30; LDL Cholesterol 75; Triglycerides 258 Continue atorvastatin 40 mg tablet daily Continue Lovaza 2 g twice daily Start Zetia 10 mg daily  Coronary artery disease- nuclear medicine stress test 04/2018 showed a low risk study. Continue aspirin 81 mg tablet daily Clopidogrel 75 mg tablet daily  Disposition: Follow-up in 4 weeks.  Current medicines are reviewed at length with the patient today.    Labs/ tests ordered today include: None today Coletta Memos NP-C   07/30/2019 11:24 AM    [facility] [address] Office [phone] Fax [phone]  Notice: This dictation was prepared with Dragon dictation along with smaller phrase technology. Any transcriptional errors that result from this  process are unintentional and may not be corrected upon review.

## 2019-07-30 ENCOUNTER — Other Ambulatory Visit: Payer: Self-pay

## 2019-07-30 ENCOUNTER — Encounter: Payer: Self-pay | Admitting: Adult Health

## 2019-07-30 ENCOUNTER — Ambulatory Visit: Payer: PPO | Admitting: General Practice

## 2019-07-30 VITALS — BP 138/62 | HR 57 | Temp 97.3°F | Ht 68.5 in | Wt 235.0 lb

## 2019-07-30 DIAGNOSIS — E785 Hyperlipidemia, unspecified: Secondary | ICD-10-CM

## 2019-07-30 DIAGNOSIS — I2510 Atherosclerotic heart disease of native coronary artery without angina pectoris: Secondary | ICD-10-CM | POA: Diagnosis not present

## 2019-07-30 DIAGNOSIS — I10 Essential (primary) hypertension: Secondary | ICD-10-CM | POA: Diagnosis not present

## 2019-07-30 DIAGNOSIS — K219 Gastro-esophageal reflux disease without esophagitis: Secondary | ICD-10-CM

## 2019-07-30 MED ORDER — EZETIMIBE 10 MG PO TABS: 10.0000 mg | ORAL_TABLET | Freq: Every day | ORAL | 3 refills | Status: DC

## 2019-07-30 MED ORDER — PANTOPRAZOLE SODIUM 40 MG PO TBEC: 40.0000 mg | DELAYED_RELEASE_TABLET | Freq: Every day | ORAL | 6 refills | Status: DC

## 2019-07-30 NOTE — Patient Instructions (Signed)
Medication Instructions:  START- Pantoprazole 40 mg daily STARt- Zetia 10 mg by mouth daily  If you need a refill on your cardiac medications before your next appointment, please call your pharmacy.  Labwork: None ordered HERE IN OUR OFFICE AT [facility]   You will need to fast. DO NOT EAT OR DRINK PAST MIDNIGHT.     You will NOT need to fast   Take the provided lab slips with you to the lab for your blood draw.   When you have your labs (blood work) drawn today and your tests are completely normal, you will receive your results only by MyChart Message (if you have MyChart) -OR-  A paper copy in the mail.  If you have any lab test that is abnormal or we need to change your treatment, we will call you to review these results.  Testing/Procedures: None Ordered  Follow-Up: . Your physician recommends that you schedule a follow-up appointment in: 4 Weeks   At [facility], you and your health needs are our priority.  As part of our continuing mission to provide you with exceptional heart care, we have created designated Provider Care Teams.  These Care Teams include your primary Cardiologist (physician) and Advanced Practice Providers (APPs -  Physician Assistants and Nurse Practitioners) who all work together to provide you with the care you need, when you need it.  Thank you for choosing [facility] at [facility]!!

## 2019-08-25 ENCOUNTER — Encounter: Payer: Self-pay | Admitting: Family Medicine

## 2019-08-25 ENCOUNTER — Ambulatory Visit: Payer: PPO | Admitting: Adult Health

## 2019-08-25 DIAGNOSIS — D3502 Benign neoplasm of left adrenal gland: Secondary | ICD-10-CM | POA: Insufficient documentation

## 2019-08-30 ENCOUNTER — Other Ambulatory Visit: Payer: Self-pay | Admitting: Cardiovascular Disease

## 2019-09-03 ENCOUNTER — Other Ambulatory Visit: Payer: Self-pay

## 2019-09-03 ENCOUNTER — Encounter: Payer: Self-pay | Admitting: Family Medicine

## 2019-09-03 ENCOUNTER — Ambulatory Visit (INDEPENDENT_AMBULATORY_CARE_PROVIDER_SITE_OTHER): Payer: PPO | Admitting: Family Medicine

## 2019-09-03 VITALS — BP 140/62 | HR 47 | Temp 98.4°F | Ht 68.5 in | Wt 241.1 lb

## 2019-09-03 DIAGNOSIS — E785 Hyperlipidemia, unspecified: Secondary | ICD-10-CM | POA: Diagnosis not present

## 2019-09-03 DIAGNOSIS — M79672 Pain in left foot: Secondary | ICD-10-CM | POA: Diagnosis not present

## 2019-09-03 DIAGNOSIS — IMO0002 Reserved for concepts with insufficient information to code with codable children: Secondary | ICD-10-CM

## 2019-09-03 DIAGNOSIS — E118 Type 2 diabetes mellitus with unspecified complications: Secondary | ICD-10-CM | POA: Diagnosis not present

## 2019-09-03 DIAGNOSIS — I10 Essential (primary) hypertension: Secondary | ICD-10-CM

## 2019-09-03 DIAGNOSIS — E1165 Type 2 diabetes mellitus with hyperglycemia: Secondary | ICD-10-CM | POA: Diagnosis not present

## 2019-09-03 DIAGNOSIS — M79671 Pain in right foot: Secondary | ICD-10-CM | POA: Diagnosis not present

## 2019-09-03 DIAGNOSIS — K219 Gastro-esophageal reflux disease without esophagitis: Secondary | ICD-10-CM | POA: Diagnosis not present

## 2019-09-03 DIAGNOSIS — I2510 Atherosclerotic heart disease of native coronary artery without angina pectoris: Secondary | ICD-10-CM | POA: Diagnosis not present

## 2019-09-03 LAB — POCT GLYCOSYLATED HEMOGLOBIN (HGB A1C): Hemoglobin A1C: 8.9 % — AB (ref 4.0–5.6)

## 2019-09-03 MED ORDER — FAMOTIDINE 20 MG PO TABS: 20.0000 mg | ORAL_TABLET | Freq: Every day | ORAL | Status: DC

## 2019-09-03 MED ORDER — ATENOLOL 50 MG PO TABS: ORAL_TABLET | ORAL | 1 refills | Status: DC

## 2019-09-03 NOTE — Patient Instructions (Addendum)
Continue pantoprazole 40mg  daily, add pepcid 20mg  at night time. This is over the counter.  Split atenolol to 1 whole tablet in the morning and 1/2 tablet at night time.  Check to see if you could get diabetes medicines through the [place]. Ideally would want to be on once weekly shot like ozempic or trulicity (for cardiovascular protection).  Renew efforts to follow low sugar low carb diabetic diet to control sugar levels. Otherwise we will need to add new diabetes medicine or consider insulin shots for better sugar control.  Return in 4 months for diabetes follow up visit.

## 2019-09-03 NOTE — Assessment & Plan Note (Signed)
[facility]

## 2019-09-03 NOTE — Assessment & Plan Note (Signed)
Could not afford vascepa. Recently started on zetia - continue lipitor and lovaza.

## 2019-09-03 NOTE — Assessment & Plan Note (Signed)
Continue lisinopril 5mg  daily, split atenolol to 50mg  /25mg  daily for fuller coverage and hopefully diminish bradycardia.

## 2019-09-03 NOTE — Assessment & Plan Note (Addendum)
Encouraged ongoing efforts to follow healthy diet and lifestyle changes for sustainable weight loss. Obesity contributes to all chronic medical conditions.

## 2019-09-03 NOTE — Assessment & Plan Note (Signed)
Continue daily pantoprazole, add pepcid at night for longer lasting relief.

## 2019-09-03 NOTE — Progress Notes (Signed)
This visit was conducted in person.  BP 140/62 (BP Location: Left Arm, Patient Position: Sitting, Cuff Size: Large)   Pulse (!) 47   Temp 98.4 F (36.9 C) (Temporal)   Ht 5' 8.5" (1.74 m)   Wt 241 lb 1 oz (109.3 kg)   SpO2 95%   BMI 36.12 kg/m    CC: DM f/u visit Subjective:    Patient ID: Evan Avery, male    DOB: 09-06-1946, 73 y.o.   MRN: [id number]  HPI: Evan Avery is a 73 y.o. male presenting on 09/03/2019 for Diabetes (Here for 4 mo f/u.)   Saw cards last month for progressive chest discomfort thought GERD related, started on pantoprazole 40mg  daily with benefit. Notes breakthrough symptoms at night or in am. Also started on zetia.   DM - does not regularly check sugars. Compliant with antihyperglycemic regimen which includes: metformin 1000mg  bid, glimepiride 4mg  daily. [name] was unaffordable when in donut hole - so we sent in ozempic for patient to price out - felt unaffordable as well. Denies low sugars or hypoglycemic symptoms. Denies paresthesias. Last diabetic eye exam 09/2018. Pneumovax: presumed 2014. Prevnar: 2015. Glucometer brand: Precision through the [place]. DSME: declined. Has appt tomorrow with podiatry.  Lab Results  Component Value Date   HGBA1C 8.9 (A) 09/03/2019   Diabetic Foot Exam - Simple   Simple Foot Form Diabetic Foot exam was performed with the following findings: Yes 09/03/2019  1:50 PM  Visual Inspection See comments: Yes Sensation Testing Intact to touch and monofilament testing bilaterally: Yes Pulse Check Posterior Tibialis and Dorsalis pulse intact bilaterally: Yes Comments Thickened calluses on medial soles bilaterally Has podiatry appt tomorrow    Lab Results  Component Value Date   MICROALBUR 0.2 12/27/2007        Relevant past medical, surgical, family and social history reviewed and updated as indicated. Interim medical history since our last visit reviewed. Allergies and medications reviewed and updated.  Outpatient Medications Prior to Visit  Medication Sig Dispense Refill  . aspirin 81 MG tablet Take 81 mg by mouth daily.      Marland Kitchen atorvastatin (LIPITOR) 40 MG tablet TAKE 1 TABLET BY MOUTH ONCE DAILY AT  6PM 90 tablet 3  . Brinzolamide-Brimonidine (SIMBRINZA) 1-0.2 % SUSP Apply 1 drop to eye 3 (three) times daily.     . cetirizine (ZYRTEC) 10 MG tablet Take 1 tablet (10 mg total) by mouth daily. (Patient taking differently: Take 10 mg by mouth daily. As needed) 90 tablet 3  . clopidogrel (PLAVIX) 75 MG tablet Take 1 tablet by mouth once daily 90 tablet 2  . ezetimibe (ZETIA) 10 MG tablet Take 1 tablet (10 mg total) by mouth daily. 90 tablet 3  . glimepiride (AMARYL) 4 MG tablet TAKE 1 TABLET BY MOUTH ONCE DAILY WITH  BREAKFAST 90 tablet 3  . Glucosamine-Chondroit-Vit C-Mn (GLUCOSAMINE 1500 COMPLEX PO) Take 1,500 mg by mouth daily.    Marland Kitchen ibuprofen (ADVIL,MOTRIN) 200 MG tablet Take 600 mg by mouth every 6 (six) hours as needed for mild pain.    Marland Kitchen latanoprost (XALATAN) 0.005 % ophthalmic solution Place 1 drop into both eyes at bedtime.     Marland Kitchen lisinopril (ZESTRIL) 5 MG tablet Take 1 tablet (5 mg total) by mouth daily. 90 tablet 2  . metFORMIN (GLUCOPHAGE) 1000 MG tablet TAKE 1 TABLET BY MOUTH TWICE DAILY WITH MEALS 180 tablet 1  . omega-3 acid ethyl esters (LOVAZA) 1 g capsule Take 2 capsules (2 g  total) by mouth 2 (two) times daily. 360 capsule 2  . pantoprazole (PROTONIX) 40 MG tablet Take 1 tablet (40 mg total) by mouth daily. 30 tablet 6  . senna-docusate (SENOKOT-S) 8.6-50 MG tablet Take 1 tablet by mouth at bedtime as needed for mild constipation. 15 tablet 0  . atenolol (TENORMIN) 50 MG tablet TAKE 1 & 1/2 (ONE & ONE-HALF) TABLETS BY MOUTH ONCE DAILY 135 tablet 2   No facility-administered medications prior to visit.      Per HPI unless specifically indicated in ROS section below Review of Systems Objective:    BP 140/62 (BP Location: Left Arm, Patient Position: Sitting, Cuff Size: Large)    Pulse (!) 47   Temp 98.4 F (36.9 C) (Temporal)   Ht 5' 8.5" (1.74 m)   Wt 241 lb 1 oz (109.3 kg)   SpO2 95%   BMI 36.12 kg/m   Wt Readings from Last 3 Encounters:  09/03/19 241 lb 1 oz (109.3 kg)  07/30/19 235 lb (106.6 kg)  04/30/19 235 lb (106.6 kg)    Physical Exam Vitals signs and nursing note reviewed.  Constitutional:      General: He is not in acute distress.    Appearance: Normal appearance. He is well-developed. He is obese. He is not ill-appearing.  HENT:     Head: Normocephalic and atraumatic.     Mouth/Throat:     Mouth: Mucous membranes are moist.     Pharynx: Oropharynx is clear. No posterior oropharyngeal erythema.  Eyes:     General: No scleral icterus.    Extraocular Movements: Extraocular movements intact.     Conjunctiva/sclera: Conjunctivae normal.     Pupils: Pupils are equal, round, and reactive to light.  Neck:     Musculoskeletal: Normal range of motion and neck supple.  Cardiovascular:     Rate and Rhythm: Normal rate and regular rhythm.     Pulses: Normal pulses.     Heart sounds: Normal heart sounds. No murmur.  Pulmonary:     Effort: Pulmonary effort is normal. No respiratory distress.     Breath sounds: Normal breath sounds. No wheezing, rhonchi or rales.  Musculoskeletal:     Right lower leg: No edema.     Left lower leg: No edema.     Comments: See HPI for foot exam if done  Lymphadenopathy:     Cervical: No cervical adenopathy.  Skin:    General: Skin is warm and dry.     Findings: No rash.  Neurological:     Mental Status: He is alert.  Psychiatric:        Mood and Affect: Mood normal.        Behavior: Behavior normal.       Results for orders placed or performed in visit on 09/03/19  POCT glycosylated hemoglobin (Hb A1C)  Result Value Ref Range   Hemoglobin A1C 8.9 (A) 4.0 - 5.6 %   HbA1c POC (<> result, manual entry)     HbA1c, POC (prediabetic range)     HbA1c, POC (controlled diabetic range)     Assessment & Plan:    Problem List Items Addressed This Visit    Severe obesity (BMI 35.0-39.9) with comorbidity ([place])    Encouraged ongoing efforts to follow healthy diet and lifestyle changes for sustainable weight loss. Obesity contributes to all chronic medical conditions.       Hyperlipidemia with target LDL less than 70    Could not afford vascepa. Recently started on  zetia - continue lipitor and lovaza.       Relevant Medications   atenolol (TENORMIN) 50 MG tablet   GERD (gastroesophageal reflux disease)    Continue daily pantoprazole, add pepcid at night for longer lasting relief.       Relevant Medications   famotidine (PEPCID) 20 MG tablet   Essential hypertension    Continue lisinopril 5mg  daily, split atenolol to 50mg  /25mg  daily for fuller coverage and hopefully diminish bradycardia.       Relevant Medications   atenolol (TENORMIN) 50 MG tablet   Diabetes mellitus type 2, uncontrolled, with complications (HCC) - Primary    Chronic, deteriorated despite taking full dose metformin and glimepiride regularly. Difficult situation. Trouble affording januvia (donut hole), could not afford ozempic, anticipate would also have trouble affording SGLT2. Not interested in insulin at this time. Hesitant to use thiazolidine dione in cardiac history with decreased EF on latest stress test. Not following diabetic diet - will renew efforts for this and reassess in 3-4 months. He also receives care at the [place] - I asked him to look into [place] prescribing diabetes medications in the interim.       Relevant Orders   POCT glycosylated hemoglobin (Hb A1C) (Completed)   CAD in native artery   Relevant Medications   atenolol (TENORMIN) 50 MG tablet   Bilateral foot pain    [facility]           Meds ordered this encounter  Medications  . famotidine (PEPCID) 20 MG tablet    Sig: Take 1 tablet (20 mg total) by mouth at bedtime.  Marland Kitchen atenolol (TENORMIN) 50 MG tablet    Sig: Take 50mg  in the  morning and 25mg  at night time    Dispense:  135 tablet    Refill:  1   Orders Placed This Encounter  Procedures  . POCT glycosylated hemoglobin (Hb A1C)    Patient Instructions  Continue pantoprazole 40mg  daily, add pepcid 20mg  at night time. This is over the counter.  Split atenolol to 1 whole tablet in the morning and 1/2 tablet at night time.  Check to see if you could get diabetes medicines through the [place]. Ideally would want to be on once weekly shot like ozempic or trulicity (for cardiovascular protection).  Renew efforts to follow low sugar low carb diabetic diet to control sugar levels. Otherwise we will need to add new diabetes medicine or consider insulin shots for better sugar control.  Return in 4 months for diabetes follow up visit.    Follow up plan: Return in about 4 months (around 01/01/2020) for follow up visit.  [name], MD

## 2019-09-03 NOTE — Assessment & Plan Note (Addendum)
Chronic, deteriorated despite taking full dose metformin and glimepiride regularly. Difficult situation. Trouble affording januvia (donut hole), could not afford ozempic, anticipate would also have trouble affording SGLT2. Not interested in insulin at this time. Hesitant to use thiazolidine dione in cardiac history with decreased EF on latest stress test. Not following diabetic diet - will renew efforts for this and reassess in 3-4 months. He also receives care at the [place] - I asked him to look into [place] prescribing diabetes medications in the interim.

## 2019-09-15 ENCOUNTER — Other Ambulatory Visit: Payer: Self-pay | Admitting: Family Medicine

## 2019-09-17 NOTE — Telephone Encounter (Signed)
Plavix Last filled:  06/28/19, #90 Last OV:  08/24/19, f/u (DM, HTN, etc) Next OV:  01/08/20, CPE prt 2

## 2019-11-10 ENCOUNTER — Other Ambulatory Visit: Payer: Self-pay

## 2019-11-10 ENCOUNTER — Ambulatory Visit (INDEPENDENT_AMBULATORY_CARE_PROVIDER_SITE_OTHER): Payer: PPO | Admitting: Cardiovascular Disease

## 2019-11-10 ENCOUNTER — Encounter: Payer: Self-pay | Admitting: Cardiovascular Disease

## 2019-11-10 VITALS — BP 142/71 | HR 49 | Temp 96.9°F | Ht 68.5 in | Wt 230.8 lb

## 2019-11-10 DIAGNOSIS — G4733 Obstructive sleep apnea (adult) (pediatric): Secondary | ICD-10-CM | POA: Diagnosis not present

## 2019-11-10 DIAGNOSIS — Z9989 Dependence on other enabling machines and devices: Secondary | ICD-10-CM

## 2019-11-10 DIAGNOSIS — E785 Hyperlipidemia, unspecified: Secondary | ICD-10-CM

## 2019-11-10 DIAGNOSIS — I10 Essential (primary) hypertension: Secondary | ICD-10-CM | POA: Diagnosis not present

## 2019-11-10 DIAGNOSIS — R001 Bradycardia, unspecified: Secondary | ICD-10-CM | POA: Diagnosis not present

## 2019-11-10 DIAGNOSIS — I2510 Atherosclerotic heart disease of native coronary artery without angina pectoris: Secondary | ICD-10-CM

## 2019-11-10 DIAGNOSIS — Z720 Tobacco use: Secondary | ICD-10-CM | POA: Diagnosis not present

## 2019-11-10 DIAGNOSIS — E118 Type 2 diabetes mellitus with unspecified complications: Secondary | ICD-10-CM | POA: Diagnosis not present

## 2019-11-10 NOTE — Progress Notes (Signed)
Patient ID: Evan Avery, male   DOB: October 03, 1946, 74 y.o.   MRN: [id number]   Primary M.D.: Dr. Danise Mina  HPI: Evan Avery is a 74 y.o. male who presents today for an 27 month cardiology evaluation.  Mr. Degante has known CAD and has a total of 4 stents in his RCA. In 1992, he underwent initial PTCA of his RCA. Subsequently, he underwent stenting of the proximal and mid RCA.  In 1999 his fourth stent was placed in the mid RCA between the 2 previously placed stents and was a 3.5x28 mm non-DES [place] stent. His last nuclear perfusion study in August 2012 showed inferior thinning without significant ischemia.  EF was not calculated secondary to  ectopy but previously had been 52%.  Additional problems include hypertension, hyperlipidemia, obesity, and obstructive sleep apnea for which he is he uses CPAP 100% of the time and uses  [employer]van A[employer] for his [employer].  Since I last saw him in May 2017.  He denied any recurrent episodes of chest pain or significant dyspnea.  He had a new CPAP machine and continue to be compliant.  He established primary care with Dr. Danise Mina.  Review of laboratory that he had with him in December 2016 has shown a cholesterol of 149, HDL 27, but his triglycerides were 413.  Remotely he had triglyceride elevations as high as 584.  In December, his hemoglobin A1c was 7.7.  He underwent surgery in July 2017 due to an abscess in his colon.  He required a colostomy for 6 months and in January 2018 underwent colostomy closure.   I saw him in October 2018 after not having seen him in over 17 months.  Since that evaluation, he has undergone 2 resections for cyst on the back of his neck with recurrent infections.  He recently saw Dr. Brantley Stage consideration of surgical excision.  He has felt well with reference to chest pain or shortness of breath.  He had smoked for many years but quit smoking for 10 years but unfortunately resumed smoking in June 2018 and currently  smoking 1 pack/day.  He has not had a nuclear perfusion study since 2012.  He continues to use CPAP with 100% compliance.  He was  started on Januvia for his diabetes mellitus by his primary physician.    I last saw hime in June 2019 and he was in need of back and neck surgery.  I recommended he undergo a [place] study for preoperative assessment.  This was done on April 23, 2018 which showed a small inferior defect, low risk.  EF was 50% with basal inferior hypokinesis.  He tolerated his neck and back surgery well.  Is only he is without anginal symptoms.  He has a documented umbilical/ventral  hernia and has been without abdominal discomfort.  He continues to use CPAP with 100% compliance.  He tells me he received a new CPAP machine at the [facility] is a ResMed AirSense 10.  He gets his supplies from the [place].    Since I last saw him in February 2020 he has felt well denies any chest pain with walking but does admit to some mild shortness of breath.  Unfortunately he still smokes 1 pack of cigarettes per day.  He had experienced some throat burning which had improved with pantoprazole.  He has an umbilical hernia and ventral hernia but denies any pain.  He continues to be on aspirin and Plavix for DAPT.  He is  on atorvastatin 51m,  Zetia 10 mg and Lovaza 2 capsules twice daily for mixed hyperlipidemia.  He continues to be on lisinopril 5 mg and atenolol 50 mg in the morning and 25 mg in the evening for hypertension.  There are no palpitations.  He is diabetic on glimepiride and Metformin.  He presents for reevaluation.  Past Medical History:  Diagnosis Date  . Agent orange exposure 1968  . Arthritis   . CAD (coronary artery disease)    4 stents in right artery  . Complex tear of medial meniscus of left knee as current injury 07/26/2012   [place]  . Diabetes mellitus    type 2  . Diverticulitis of colon with perforation 05/23/2016  . Gl[place] 2006   [place]    . Hypertension   . [place] 05/16/[place]  . LUMBAR RADICULOPATHY 07/18/2007   Qualifier: Diagnosis of  By: [name], [name]  . [facility] ([facility]    1992  . Sleep apnea    uses CPAP regularly    Past Surgical History:  Procedure Laterality Date  . APPENDECTOMY  1958  . BOWEL RESECTION N/A 05/23/2016   Procedure: SMALL BOWEL RESECTION;  Surgeon: TJackolyn Confer MD;  Location: [facility];  Service: General;  Laterality: N/A;  . CARDIAC CATHETERIZATION  08/11/2008   angiographically patent LAD diagonal, circumflex and multiple circumflex obtuse marginal branches from the L coronary artery; stenting of mid R coronary artery with 3.5 x 22mLiberte non DES postdilated w/ 4.0 DuraStent (KClaiborne Billings . CARDIOVASCULAR STRESS TEST  06/16/2011   R/P MV - moderate perfusion defect in basal inferoseptal, basal inferior, mid inferseptal and mid inferior regions, consistent with infarct/scar and/or diaphragmatic attenuation; non-gated study secondary to ectopy; no CP or EKG changes for ischemia; abnormal study although no significant changes from previous study; in the absense of gated images cannot calculate EF or distinguish scar/artifact  . COLONOSCOPY  08/2016   TAx5, sessile serrated polyp, diverticulosis, rpt 2 yrs ([place])  . COLOSTOMY Left 05/23/2016   Procedure: COLOSTOMY;  Surgeon: ToJackolyn ConferMD;  Location: [facility];  Service: General;  Laterality: Left;  . COLOSTOMY TAKEDOWN N/A 11/10/2016   Procedure: LAPAROSCOPIC LYSIS OF ADHESIONS AND  COLOSTOMY CLOSURE;  Surgeon: ToJackolyn ConferMD;  Location: [facility];  Service: General;  Laterality: N/A;  . CYST EXCISION  04/2018   posterior neck ([place])  . DOPPLER ECHOCARDIOGRAPHY  08/20/2008   EF >5>76%LV systolic function normal; LV mildly dilated; doppler flow suggestive of impaired LV relaxation; RV mildly dilated, RV systolic function normal, RV systolic pressure normal  . INCISION AND DRAINAGE ABSCESS N/A 05/23/2016    Procedure: DRAINAGE OF MULTIPLE INTRA  ABDOMINAL ABSCESS;  Surgeon: ToJackolyn ConferMD;  [facility];  Service: General;  Laterality: N/A;  . KNEE SURGERY Left 2015   torn meniscus ([place] . LAPAROSCOPIC LYSIS OF ADHESIONS  11/10/2016   Procedure: LAPAROSCOPIC LYSIS OF ADHESIONS;  Surgeon: ToJackolyn ConferMD;  [facility];  Service: General;;  . LAPAROTOMY N/A 05/23/2016   Procedure: EXPLORATORY LAPAROTOMY;  Surgeon: ToJackolyn ConferMD;  [facility];  Service: General;  Laterality: N/A;  . MASS EXCISION N/A 05/22/2018   Procedure: EXCISION OF CYST ON POSTERIOR NECK ERAS PATHWAY;  Surgeon: CoErroll LunaMD;  [place] [facility] Service: General;  Laterality: N/A;  . PERCUTANEOUS CORONARY STENT INTERVENTION (PCI-S)  1992, 1999, 2004   4 vessels ([place] .  PROCTOSCOPY  11/10/2016   Procedure: PROCTOSCOPY;  Surgeon: Jackolyn Confer, MD;  Location: [facility];  Service: General;;    Allergies  Allergen Reactions  . Dilaudid [Hydromorphone Hcl] Itching  . Ambien [Zolpidem] Other (See Comments)    Pt states "makes me wild"  . Niacin And Related Other (See Comments)    Extreme fatigue    Current Outpatient Medications  Medication Sig Dispense Refill  . aspirin 81 MG tablet Take 81 mg by mouth daily.      Marland Kitchen atenolol (TENORMIN) 50 MG tablet Take 22m in the morning and 272mat night time 135 tablet 1  . atorvastatin (LIPITOR) 40 MG tablet TAKE 1 TABLET BY MOUTH ONCE DAILY AT  6PM 90 tablet 3  . Brinzolamide-Brimonidine (SIMBRINZA) 1-0.2 % SUSP Apply 1 drop to eye 3 (three) times daily.     . cetirizine (ZYRTEC) 10 MG tablet Take 10 mg by mouth as needed for allergies.    . Marland Kitchenlopidogrel (PLAVIX) 75 MG tablet Take 1 tablet by mouth once daily 90 tablet 1  . ezetimibe (ZETIA) 10 MG tablet Take 1 tablet (10 mg total) by mouth daily. 90 tablet 3  . famotidine (PEPCID) 20 MG tablet Take 1 tablet (20 mg total) by mouth at bedtime.    . Marland Kitchenlimepiride (AMARYL) 4 MG  tablet TAKE 1 TABLET BY MOUTH ONCE DAILY WITH  BREAKFAST 90 tablet 3  . Glucosamine-Chondroit-Vit C-Mn (GLUCOSAMINE 1500 COMPLEX PO) Take 1,500 mg by mouth daily.    . Marland Kitchenbuprofen (ADVIL,MOTRIN) 200 MG tablet Take 600 mg by mouth every 6 (six) hours as needed for mild pain.    . Marland Kitchenatanoprost (XALATAN) 0.005 % ophthalmic solution Place 1 drop into both eyes at bedtime.     . Marland Kitchenisinopril (ZESTRIL) 5 MG tablet Take 1 tablet (5 mg total) by mouth daily. 90 tablet 2  . metFORMIN (GLUCOPHAGE) 1000 MG tablet TAKE 1 TABLET BY MOUTH TWICE DAILY WITH MEALS 180 tablet 1  . omega-3 acid ethyl esters (LOVAZA) 1 g capsule Take 2 capsules (2 g total) by mouth 2 (two) times daily. 360 capsule 2  . pantoprazole (PROTONIX) 40 MG tablet Take 1 tablet (40 mg total) by mouth daily. 30 tablet 6  . senna-docusate (SENOKOT-S) 8.6-50 MG tablet Take 1 tablet by mouth at bedtime as needed for mild constipation. 15 tablet 0   No current facility-administered medications for this visit.    Socially he is married has 2 children 2 grandchildren. He does walk several times a week. He does have a tobacco history. There is no alcohol use.   ROS General: Negative; No fevers, chills, or night sweats; positive for obesity. HEENT: Negative; No changes in vision or hearing, sinus congestion, difficulty swallowing Pulmonary: Negative; No cough, wheezing, shortness of breath, hemoptysis Cardiovascular: Negative; No chest pain, presyncope, syncope, palpatations GI: Colonic abscess requiring colostomy, and ultimate closure GU: Negative; No dysuria, hematuria, or difficulty voiding Musculoskeletal: He is status post left knee surgery; no myalgias, joint pain, or weakness Hematologic/Oncology: Negative; no easy bruising, bleeding Endocrine: Negative; no heat/cold intolerance; no diabetes Neuro: Negative; no changes in balance, headaches Skin: Recurrent cysts in the back of his neck Psychiatric: Negative; No behavioral problems,  depression Sleep: Positive for sleep apnea, on CPAP with 100% compliance. No snoring, daytime sleepiness, hypersomnolence, bruxism, restless legs, hypnogognic hallucinations, no cataplexy Other comprehensive 14 point system review is negative.   PE BP (!) 142/71   Pulse (!) 49   Temp (!) 96.9 F (36.1 C)  Ht 5' 8.5" (1.74 m)   Wt 230 lb 12.8 oz (104.7 kg)   SpO2 96%   BMI 34.58 kg/m    Repeat blood pressure by me was 118/70  Wt Readings from Last 3 Encounters:  11/10/19 230 lb 12.8 oz (104.7 kg)  09/03/19 241 lb 1 oz (109.3 kg)  07/30/19 235 lb (106.6 kg)   General: Alert, oriented, no distress.  Skin: normal turgor, no rashes, warm and dry HEENT: Normocephalic, atraumatic. Pupils equal round and reactive to light; sclera anicteric; extraocular muscles intact Nose without nasal septal hypertrophy Mouth/Parynx benign; Mallinpatti scale 3 Neck: Thick neck; No JVD, no carotid bruits; normal carotid upstroke Lungs: clear to ausculatation and percussion; no wheezing or rales Chest wall: without tenderness to palpitation Heart: PMI not displaced, RRR, s1 s2 normal, 1/6 systolic murmur, no diastolic murmur, no rubs, gallops, thrills, or heaves Abdomen: Umbilical and ventral hernia; soft, nontender; no hepatosplenomehaly, BS+; abdominal aorta nontender and not dilated by palpation. Back: no CVA tenderness Pulses 2+ Musculoskeletal: full range of motion, normal strength, no joint deformities Extremities: no clubbing cyanosis or edema, Homan's sign negative  Neurologic: grossly nonfocal; Cranial nerves grossly wnl Psychologic: Normal mood and affect   ECG (independently read by me):Sinus bradycardia at 46 bpm.  Nonspecific T changes.  Normal intervals.  No ectopy  February 2020 ECG (independently read by me): Sinus Bradycardioa 51; Norml intervals  June 2018 ECG (independently read by me): Sinus bradycardia 57 bpm.  No ectopy.  Normal intervals.  October 2018 ECG (independently  read by me): Normal sinus rhythm with PAC.  PR interval 168 ms.  QTc interval 454 ms.  May 2017 ECG (independently read by me): Sinus bradycardia 52 bpm.  T-wave abnormality in leads 3 and aVF.  May 2016 ECG (independently read by me): Sinus bradycardia 53 bpm.  Nondiagnostic T changes with T-wave inversion in lead 3.  June 2015 ECG (independently read by me):  sinus bradycardia at 55 QTc interval 447 ms.  PR interval 174 ms.  Nondiagnostic T changes in lead 3.  ECG: Sinus rhythm at 56 beats per minute. No ectopy. Normal intervals; nonspecific T changes in lead 3.  LABS: BMP Latest Ref Rng & Units 02/18/2019 12/27/2018 05/15/2018  Glucose 70 - 99 mg/dL - 151(H) 228(H)  BUN 6 - 23 mg/dL - 15 20  Creatinine 0.6 - 1.3 1.1 1.12 1.07  Sodium 135 - 145 mEq/L - 138 141  Potassium 3.4 - 5.3 4.7 5.2(H) 4.9  Chloride 96 - 112 mEq/L - 103 105  CO2 19 - 32 mEq/L - 29 27  Calcium 8.4 - 10.5 mg/dL - 9.9 9.8   Hepatic Function Latest Ref Rng & Units 02/18/2019 12/27/2018 03/14/2018  Total Protein 6.0 - 8.3 g/dL - 6.8 7.0  Albumin 3.5 - 5.2 g/dL - 4.6 4.3  AST 14 - 40 15 14 17   ALT 10 - 40 30 23 23   Alk Phosphatase 25 - 125 71 68 54  Total Bilirubin 0.2 - 1.2 mg/dL - 0.5 0.7  Bilirubin, Direct 0.0 - 0.3 mg/dL - - -    CBC Latest Ref Rng & Units 02/18/2019 03/23/2017 11/11/2016  WBC - 7.2 5.3 9.7  Hemoglobin 13.5 - 17.5 13.2(A) 12.9(L) 10.8(L)  Hematocrit 39.0 - 52.0 % - 38.0(L) 32.0(L)  Platelets 150 - 399 147(A) 153.0 146(L)   Lab Results  Component Value Date   TSH 1.39 02/18/2019   Lab Results  Component Value Date   HGBA1C 8.9 (A) 09/03/2019  Lipid Panel     Component Value Date/Time   CHOL 124 02/18/2019 0000   CHOL 149 03/13/2016 0803   TRIG 258 (A) 02/18/2019 0000   TRIG 387 (H) 03/13/2016 0803   HDL 30 (A) 02/18/2019 0000   HDL 26 (L) 03/13/2016 0803   CHOLHDL 5 12/27/2018 1245   VLDL 55.8 (H) 12/27/2018 1245   LDLCALC 75 02/18/2019 0000   LDLCALC 46 03/13/2016 0803   LDLDIRECT  61.0 12/27/2018 1245    RADIOLOGY: No results found.  IMPRESSION:  1. CAD in native artery   2. Essential hypertension, benign   3. Hyperlipidemia with target LDL less than 70   4. OSA on CPAP   5. Type 2 diabetes mellitus with complication, without long-term current use of insulin ([place])   6. Tobacco abuse   7. Bradycardia      ASSESSMENT AND PLAN: Mr. Spivack  is a 74 year old Caucasian male who has CAD and underwent initial PTCA to his RCA in 1992.  His last catheterization was in October 2009 when his fourth stent was placed in his RCA.  He has continued to be free of recurrent anginal symptomatology.  He continues to be on dual antiplatelet therapy with aspirin and Plavix with his multiple stents.  A preoperative Lexiscan Myoview study in July 2019 prior to his back surgery was low risk and showed a small inferior defect with EF 50%.  His blood pressure when rechecked by me was 118/70 on his regimen consisting of lisinopril 5 mg in addition to atenolol 50 mg in the morning and 25 mg at night.  He is bradycardic but remains asymptomatic and denies any dizziness or lightheadedness.  I have recommended he monitor his heart rate closely at home.  If he does experience any lightheadedness discussed dose reduction of a atenolol.  He denies any anginal symptomatology.  He is on atorvastatin 40 mg, Zetia 10 mg, and omega-3 fatty acids 2 capsules twice daily for his mixed hyperlipidemia.  Laboratory in April 2020 showed an LDL of 75 but triglycerides remained elevated at 258.  He is followed at the [place].   He is on CPAP therapy has a ResMed air sense 10 CPAP unit and gets his supplies from the [place].  He is moderately obese with a BMI of 34.6.  We discussed the importance of increased activity.  Unfortunately he continues to smoke cigarettes and smoking cessation again was discussed and strongly recommended both for he and his wife.  He tells me he will be undergoing repeat laboratory with his primary  physician.  I will asked that these be sent to me for my review and at medication adjustments may be necessary if indicated.  I will see him in 6 months for cardiology evaluation or sooner if problems arise.  Time spent: 25 minutes  Troy Sine, MD, [facility]  11/12/2019 8:22 AM

## 2019-11-10 NOTE — Patient Instructions (Addendum)
Medication Instructions:  Continue current medications  *If you need a refill on your cardiac medications before your next appointment, please call your pharmacy*  Lab Work: None Ordered  Testing/Procedures: None Ordered  Follow-Up: At [facility], you and your health needs are our priority.  As part of our continuing mission to provide you with exceptional heart care, we have created designated Provider Care Teams.  These Care Teams include your primary Cardiologist (physician) and Advanced Practice Providers (APPs -  Physician Assistants and Nurse Practitioners) who all work together to provide you with the care you need, when you need it.  Your next appointment:   6 month(s)  The format for your next appointment:   In Person  Provider:   Shelva Majestic, MD

## 2019-11-12 ENCOUNTER — Encounter: Payer: Self-pay | Admitting: Cardiovascular Disease

## 2019-11-13 NOTE — Addendum Note (Signed)
Addended by: Vennie Homans on: 11/13/2019 10:07 AM   Modules accepted: Orders

## 2019-12-16 ENCOUNTER — Ambulatory Visit: Payer: PPO | Admitting: Cardiovascular Disease

## 2019-12-21 ENCOUNTER — Other Ambulatory Visit: Payer: Self-pay | Admitting: Family Medicine

## 2019-12-30 ENCOUNTER — Other Ambulatory Visit: Payer: Self-pay | Admitting: Family Medicine

## 2019-12-30 DIAGNOSIS — E1165 Type 2 diabetes mellitus with hyperglycemia: Secondary | ICD-10-CM

## 2019-12-30 DIAGNOSIS — IMO0002 Reserved for concepts with insufficient information to code with codable children: Secondary | ICD-10-CM

## 2019-12-30 DIAGNOSIS — E785 Hyperlipidemia, unspecified: Secondary | ICD-10-CM

## 2019-12-30 DIAGNOSIS — D649 Anemia, unspecified: Secondary | ICD-10-CM

## 2019-12-30 DIAGNOSIS — Z125 Encounter for screening for malignant neoplasm of prostate: Secondary | ICD-10-CM

## 2020-01-01 ENCOUNTER — Ambulatory Visit: Payer: PPO

## 2020-01-01 ENCOUNTER — Other Ambulatory Visit: Payer: PPO

## 2020-01-02 ENCOUNTER — Ambulatory Visit (INDEPENDENT_AMBULATORY_CARE_PROVIDER_SITE_OTHER): Payer: PPO

## 2020-01-02 ENCOUNTER — Other Ambulatory Visit (INDEPENDENT_AMBULATORY_CARE_PROVIDER_SITE_OTHER): Payer: PPO

## 2020-01-02 ENCOUNTER — Other Ambulatory Visit: Payer: Self-pay

## 2020-01-02 VITALS — BP 161/75 | Wt 230.0 lb

## 2020-01-02 DIAGNOSIS — Z125 Encounter for screening for malignant neoplasm of prostate: Secondary | ICD-10-CM | POA: Diagnosis not present

## 2020-01-02 DIAGNOSIS — Z0000 Encounter for general adult medical examination without abnormal findings: Secondary | ICD-10-CM | POA: Diagnosis not present

## 2020-01-02 DIAGNOSIS — E1165 Type 2 diabetes mellitus with hyperglycemia: Secondary | ICD-10-CM | POA: Diagnosis not present

## 2020-01-02 DIAGNOSIS — E118 Type 2 diabetes mellitus with unspecified complications: Secondary | ICD-10-CM

## 2020-01-02 DIAGNOSIS — D649 Anemia, unspecified: Secondary | ICD-10-CM

## 2020-01-02 DIAGNOSIS — E785 Hyperlipidemia, unspecified: Secondary | ICD-10-CM

## 2020-01-02 DIAGNOSIS — IMO0002 Reserved for concepts with insufficient information to code with codable children: Secondary | ICD-10-CM

## 2020-01-02 LAB — CBC WITH DIFFERENTIAL/PLATELET
Basophils Absolute: 0.1 10*3/uL (ref 0.0–0.1)
Basophils Relative: 1.7 % (ref 0.0–3.0)
Eosinophils Absolute: 0.4 10*3/uL (ref 0.0–0.7)
Eosinophils Relative: 4.7 % (ref 0.0–5.0)
HCT: 39.7 % (ref 39.0–52.0)
Hemoglobin: 13.6 g/dL (ref 13.0–17.0)
Lymphocytes Relative: 20.5 % (ref 12.0–46.0)
Lymphs Abs: 1.6 10*3/uL (ref 0.7–4.0)
MCHC: 34.3 g/dL (ref 30.0–36.0)
MCV: 93.8 fl (ref 78.0–100.0)
Monocytes Absolute: 0.6 10*3/uL (ref 0.1–1.0)
Monocytes Relative: 7.9 % (ref 3.0–12.0)
Neutro Abs: 5 10*3/uL (ref 1.4–7.7)
Neutrophils Relative %: 65.2 % (ref 43.0–77.0)
Platelets: 144 10*3/uL — ABNORMAL LOW (ref 150.0–400.0)
RBC: 4.23 Mil/uL (ref 4.22–5.81)
RDW: 14.4 % (ref 11.5–15.5)
WBC: 7.7 10*3/uL (ref 4.0–10.5)

## 2020-01-02 LAB — LIPID PANEL
Cholesterol: 75 mg/dL (ref 0–200)
HDL: 25.8 mg/dL — ABNORMAL LOW (ref >39.00)
LDL Cholesterol: 18 mg/dL (ref 0–99)
NonHDL: 49.38
Total CHOL/HDL Ratio: 3
Triglycerides: 156 mg/dL — ABNORMAL HIGH (ref 0.0–149.0)
VLDL: 31.2 mg/dL (ref 0.0–40.0)

## 2020-01-02 LAB — COMPREHENSIVE METABOLIC PANEL
ALT: 17 U/L (ref 0–53)
AST: 13 U/L (ref 0–37)
Albumin: 4 g/dL (ref 3.5–5.2)
Alkaline Phosphatase: 64 U/L (ref 39–117)
BUN: 20 mg/dL (ref 6–23)
CO2: 28 mEq/L (ref 19–32)
Calcium: 9.5 mg/dL (ref 8.4–10.5)
Chloride: 104 mEq/L (ref 96–112)
Creatinine, Ser: 1.05 mg/dL (ref 0.40–1.50)
GFR: 68.97 mL/min (ref >60.00)
Glucose, Bld: 238 mg/dL — ABNORMAL HIGH (ref 70–99)
Potassium: 4.7 mEq/L (ref 3.5–5.1)
Sodium: 139 mEq/L (ref 135–145)
Total Bilirubin: 0.7 mg/dL (ref 0.2–1.2)
Total Protein: 6.3 g/dL (ref 6.0–8.3)

## 2020-01-02 LAB — HEMOGLOBIN A1C: Hgb A1c MFr Bld: 9.2 % — ABNORMAL HIGH (ref 4.6–6.5)

## 2020-01-02 LAB — PSA: PSA: 0.52 ng/mL (ref 0.10–4.00)

## 2020-01-02 NOTE — Progress Notes (Signed)
Subjective:   Evan Avery is a 74 y.o. male who presents for Medicare Annual/Subsequent preventive examination.  Review of Systems: N/A   This visit is being conducted through telemedicine via telephone at the nurse health advisor's home address due to the COVID-19 pandemic. This patient has given me verbal consent via doximity to conduct this visit, patient states they are participating from their home address. Patient and myself are on the telephone call. There is no referral for this visit. Some vital signs may be absent or patient reported.    Patient identification: identified by name, DOB, and current address   Cardiac Risk Factors include: advanced age (>4men, >20 women);diabetes mellitus;hypertension;dyslipidemia;male gender;smoking/ tobacco exposure     Objective:    Vitals: BP (!) 161/75   Wt 230 lb (104.3 kg)   BMI 34.46 kg/m   Body mass index is 34.46 kg/m.  Advanced Directives 01/02/2020 12/27/2018 05/22/2018 05/14/2018 12/14/2017 12/06/2016 11/10/2016  Does Patient Have a Medical Advance Directive? No No No No No No No  Type of Advance Directive - - - - - - -  Does patient want to make changes to medical advance directive? - - - - - - -  Copy of [place] in Chart? - - - - - - -  Would patient like information on creating a medical advance directive? No - Patient declined No - Patient declined No - Patient declined No - Patient declined No - Patient declined - No - Patient declined  Pre-existing out of facility DNR order (yellow form or pink MOST form) - - - - - - -    Tobacco Social History   Tobacco Use  Smoking Status Current Every Day Smoker  . Packs/day: 1.00  . Years: 50.00  . Pack years: 50.00  . Types: Cigarettes  . Start date: 10/23/1958  Smokeless Tobacco Never Used     Ready to quit: Not Answered Counseling given: Not Answered   Clinical Intake:  Pre-visit preparation completed: Yes  Pain : No/denies pain     Nutritional  Risks: None Diabetes: Yes CBG done?: No Did pt. bring in CBG monitor from home?: No  How often do you need to have someone help you when you read instructions, pamphlets, or other written materials from your doctor or pharmacy?: 1 - Never What is the last grade level you completed in school?: 12th  Interpreter Needed?: No  Information entered by :: cJohnson, LPN  Past Medical History:  Diagnosis Date  . Agent orange exposure 1968  . Arthritis   . CAD (coronary artery disease)    4 stents in right artery  . Complex tear of medial meniscus of left knee as current injury 07/26/2012   [place]  . Diabetes mellitus    type 2  . Diverticulitis of colon with perforation 05/23/2016  . Glaucoma 2006   on drops, followed by [place]  . Hyperlipidemia   . Hypertension   . Intra-abdominal abscess ([place]) 05/16/2016  . LUMBAR RADICULOPATHY 07/18/2007   Qualifier: Diagnosis of  By: Arnoldo Morale MD, Balinda Quails   . Myocardial infarction [facility])    1992  . Sleep apnea    uses CPAP regularly   Past Surgical History:  Procedure Laterality Date  . APPENDECTOMY  1958  . BOWEL RESECTION N/A 05/23/2016   Procedure: SMALL BOWEL RESECTION;  Surgeon: Jackolyn Confer, MD;  Location: [facility];  Service: General;  Laterality: N/A;  . CARDIAC CATHETERIZATION  08/11/2008   angiographically patent LAD  diagonal, circumflex and multiple circumflex obtuse marginal branches from the L coronary artery; stenting of mid R coronary artery with 3.5 x 3mm Liberte non DES postdilated w/ 4.0 DuraStent [name])  . CARDIOVASCULAR STRESS TEST  06/16/2011   R/P MV - moderate perfusion defect in basal inferoseptal, basal inferior, mid inferseptal and mid inferior regions, consistent with infarct/scar and/or diaphragmatic attenuation; non-gated study secondary to ectopy; no CP or EKG changes for ischemia; abnormal study although no significant changes from previous study; in the absense of gated images cannot calculate EF or distinguish scar/artifact    . COLONOSCOPY  08/2016   TAx5, sessile serrated polyp, diverticulosis, rpt 2 yrs ([place])  . COLOSTOMY Left 05/23/2016   Procedure: COLOSTOMY;  Surgeon: Jackolyn Confer, MD;  Location: [facility];  Service: General;  Laterality: Left;  . COLOSTOMY TAKEDOWN N/A 11/10/2016   Procedure: LAPAROSCOPIC LYSIS OF ADHESIONS AND  COLOSTOMY CLOSURE;  Surgeon: Jackolyn Confer, MD;  Location: [facility];  Service: General;  Laterality: N/A;  . CYST EXCISION  04/2018   posterior neck ([place])  . DOPPLER ECHOCARDIOGRAPHY  08/20/2008   EF [id number]; LV systolic function normal; LV mildly dilated; doppler flow suggestive of impaired LV relaxation; RV mildly dilated, RV systolic function normal, RV systolic pressure normal  . INCISION AND DRAINAGE ABSCESS N/A 05/23/2016   Procedure: DRAINAGE OF MULTIPLE INTRA  ABDOMINAL ABSCESS;  Surgeon: Jackolyn Confer, MD;  Location[facility];  Service: General;  Laterality: N/A;  . KNEE SURGERY Left 2015   torn meniscus [facility])  . LAPAROSCOPIC LYSIS OF ADHESIONS  11/10/2016   Procedure: LAPAROSCOPIC LYSIS OF ADHESIONS;  Surgeon: Jackolyn Confer, MD;  Location[facility];  Service: General;;  . LAPAROTOMY N/A 05/23/2016   Procedure: EXPLORATORY LAPAROTOMY;  Surgeon: Jackolyn Confer, MD;  [facility];  Service: General;  Laterality: N/A;  . MASS EXCISION N/A 05/22/2018   Procedure: EXCISION OF CYST ON POSTERIOR NECK ERAS PATHWAY;  Surgeon: Erroll Luna, MD;  Location: [place];  Service: General;  Laterality: N/A;  . PERCUTANEOUS CORONARY STENT INTERVENTION (PCI-S)  1992, 1999, 2004   4 vessels [name])  . PROCTOSCOPY  11/10/2016   Procedure: PROCTOSCOPY;  Surgeon: Jackolyn Confer, MD;  [facility];  Service: General;;   Family History  Problem Relation Age of Onset  . Alzheimer's disease Mother   . Heart disease Father   . Heart attack Brother   . Diabetes Brother   . Heart disease Maternal Grandmother   . Cancer Maternal Grandfather   . Diabetes  Paternal Grandfather    Social History   Socioeconomic History  . Marital status: Married    Spouse name: Not on file  . Number of children: Not on file  . Years of education: Not on file  . Highest education level: Not on file  Occupational History  . Not on file  Tobacco Use  . Smoking status: Current Every Day Smoker    Packs/day: 1.00    Years: 50.00    Pack years: 50.00    Types: Cigarettes    Start date: 10/23/1958  . Smokeless tobacco: Never Used  Substance and Sexual Activity  . Alcohol use: Yes    Alcohol/week: 0.0 standard drinks    Comment: rarely  . Drug use: No  . Sexual activity: Yes  Other Topics Concern  . Not on file  Social History Narrative   Lives with wife and son (with mental issues)   Occupation: retired, was Multimedia programmer for [employer]  Activity: no regular exercise   Diet: good water, fruits/vegetables daily   Social Determinants of Health   Financial Resource Strain: Low Risk   . Difficulty of Paying Living Expenses: Not hard at all  Food Insecurity: No Food Insecurity  . Worried About Charity fundraiser in the Last Year: Never true  . Ran Out of Food in the Last Year: Never true  Transportation Needs: No Transportation Needs  . Lack of Transportation (Medical): No  . Lack of Transportation (Non-Medical): No  Physical Activity: Inactive  . Days of Exercise per Week: 0 days  . Minutes of Exercise per Session: 0 min  Stress: No Stress Concern Present  . Feeling of Stress : Not at all  Social Connections:   . Frequency of Communication with Friends and Family:   . Frequency of Social Gatherings with Friends and Family:   . Attends Religious Services:   . Active Member of Clubs or Organizations:   . Attends Archivist Meetings:   Marland Kitchen Marital Status:     Outpatient Encounter Medications as of 01/02/2020  Medication Sig  . aspirin 81 MG tablet Take 81 mg by mouth daily.    Marland Kitchen atenolol (TENORMIN) 50 MG tablet Take 50mg  in the  morning and 25mg  at night time  . atorvastatin (LIPITOR) 40 MG tablet TAKE 1 TABLET BY MOUTH ONCE DAILY AT  6PM  . Brinzolamide-Brimonidine (SIMBRINZA) 1-0.2 % SUSP Apply 1 drop to eye 3 (three) times daily.   . cetirizine (ZYRTEC) 10 MG tablet Take 10 mg by mouth as needed for allergies.  Marland Kitchen clopidogrel (PLAVIX) 75 MG tablet Take 1 tablet by mouth once daily  . famotidine (PEPCID) 20 MG tablet Take 1 tablet (20 mg total) by mouth at bedtime.  Marland Kitchen glimepiride (AMARYL) 4 MG tablet Take 1 tablet by mouth once daily with breakfast  . Glucosamine-Chondroit-Vit C-Mn (GLUCOSAMINE 1500 COMPLEX PO) Take 1,500 mg by mouth daily.  Marland Kitchen ibuprofen (ADVIL,MOTRIN) 200 MG tablet Take 600 mg by mouth every 6 (six) hours as needed for mild pain.  Marland Kitchen latanoprost (XALATAN) 0.005 % ophthalmic solution Place 1 drop into both eyes at bedtime.   Marland Kitchen lisinopril (ZESTRIL) 5 MG tablet Take 1 tablet (5 mg total) by mouth daily.  . metFORMIN (GLUCOPHAGE) 1000 MG tablet TAKE 1 TABLET BY MOUTH TWICE DAILY WITH MEALS  . omega-3 acid ethyl esters (LOVAZA) 1 g capsule Take 2 capsules (2 g total) by mouth 2 (two) times daily.  . pantoprazole (PROTONIX) 40 MG tablet Take 1 tablet (40 mg total) by mouth daily.  Marland Kitchen senna-docusate (SENOKOT-S) 8.6-50 MG tablet Take 1 tablet by mouth at bedtime as needed for mild constipation.  Marland Kitchen ezetimibe (ZETIA) 10 MG tablet Take 1 tablet (10 mg total) by mouth daily.   No facility-administered encounter medications on file as of 01/02/2020.    Activities of Daily Living In your present state of health, do you have any difficulty performing the following activities: 01/02/2020  Hearing? Y  Comment wears hearing aids  Vision? N  Difficulty concentrating or making decisions? N  Walking or climbing stairs? N  Dressing or bathing? N  Doing errands, shopping? N  Preparing Food and eating ? N  Using the Toilet? N  In the past six months, have you accidently leaked urine? N  Do you have problems with loss  of bowel control? N  Managing your Medications? N  Managing your Finances? N  Housekeeping or managing your Housekeeping? N  Some recent  data might be hidden    Patient Care Team: [name], MD as PCP - General (Family Medicine) Troy Sine, MD as Consulting Physician (Cardiology) Jackolyn Confer, MD as Consulting Physician (General Surgery)   Assessment:   This is a routine wellness examination for Evan Avery.  Exercise Activities and Dietary recommendations Current Exercise Habits: The patient does not participate in regular exercise at present, Exercise limited by: None identified  Goals    . DIET - INCREASE WATER INTAKE     Starting 12/27/2018, I will continue to drink at least 6-8 glasses of water daily.     . Patient Stated     01/02/2020, I will maintain and continue medications as prescribed.        Fall Risk Fall Risk  01/02/2020 12/27/2018 12/14/2017 12/06/2016 06/21/2016  Falls in the past year? 0 0 No No No  Number falls in past yr: 0 - - - -  Injury with Fall? 0 - - - -  Risk for fall due to : Medication side effect - - - (No Data)  Risk for fall due to: Comment - - - - None  Follow up Falls evaluation completed;Falls prevention discussed - - - -   Is the patient's home free of loose throw rugs in walkways, pet beds, electrical cords, etc?   yes      Grab bars in the bathroom? no      Handrails on the stairs?   yes      Adequate lighting?   yes  Timed Get Up and Go Performed: N/A  Depression Screen PHQ 2/9 Scores 01/02/2020 12/27/2018 12/14/2017 12/06/2016  PHQ - 2 Score 0 0 0 0  PHQ- 9 Score 0 0 0 -    Cognitive Function MMSE - Mini Mental State Exam 01/02/2020 12/27/2018 12/14/2017 12/06/2016  Orientation to time 5 5 5 5   Orientation to Place 5 5 5 5   Registration 3 3 3 3   Attention/ Calculation 5 0 0 0  Recall 3 3 3 3   Language- name 2 objects - 0 0 0  Language- repeat 1 1 1 1   Language- follow 3 step command - 3 3 3   Language- read & follow direction -  0 0 0  Write a sentence - 0 0 0  Copy design - 0 0 0  Total score - 20 20 20   Mini Cog  Mini-Cog screen was completed. Maximum score is 22. A value of 0 denotes this part of the MMSE was not completed or the patient failed this part of the Mini-Cog screening.       Immunization History  Administered Date(s) Administered  . Fluad Quad(high Dose 65+) 07/29/2019  . Influenza Split 08/02/2012  . Influenza,inj,Quad PF,6+ Mos 09/17/2013, 09/07/2014, 09/06/2016, 11/07/2017  . Influenza-Unspecified 09/28/2015, 07/23/2018  . Pneumococcal Conjugate-13 10/23/2013  . Pneumococcal-Unspecified 10/23/2012  . Td 10/23/2000  . Tdap 10/23/2012    Qualifies for Shingles Vaccine: Yes  Screening Tests Health Maintenance  Topic Date Due  . DTAP VACCINES (1) 10/05/1946  . COLONOSCOPY  09/13/2018  . OPHTHALMOLOGY EXAM  09/23/2019  . HEMOGLOBIN A1C  03/02/2020  . FOOT EXAM  09/02/2020  . DTaP/Tdap/Td (3 - Td) 10/23/2022  . TETANUS/TDAP  10/23/2022  . INFLUENZA VACCINE  Completed  . Hepatitis C Screening  Completed  . PNA vac Low Risk Adult  Completed   Cancer Screenings: Lung: Low Dose CT Chest recommended if Age 56-80 years, 30 pack-year currently smoking OR have quit w/in 15 years.  Patient does qualify. Completed 04/08/2019. Colorectal: due, will discuss with provider first   Additional Screenings:  Hepatitis C Screening: 08/09/2018      Plan:    Patient will maintain and continue medications as prescribed,   I have personally reviewed and noted the following in the patient's chart:   . Medical and social history . Use of alcohol, tobacco or illicit drugs  . Current medications and supplements . Functional ability and status . Nutritional status . Physical activity . Advanced directives . List of other physicians . Hospitalizations, surgeries, and ER visits in previous 12 months . Vitals . Screenings to include cognitive, depression, and falls . Referrals and appointments  In  addition, I have reviewed and discussed with patient certain preventive protocols, quality metrics, and best practice recommendations. A written personalized care plan for preventive services as well as general preventive health recommendations were provided to patient.     Andrez Grime, LPN  [id number]

## 2020-01-02 NOTE — Patient Instructions (Signed)
Mr. Evan Avery , Thank you for taking time to come for your Medicare Wellness Visit. I appreciate your ongoing commitment to your health goals. Please review the following plan we discussed and let me know if I can assist you in the future.   Screening recommendations/referrals: Colonoscopy: will discuss with provider  Recommended yearly ophthalmology/optometry visit for glaucoma screening and checkup Recommended yearly dental visit for hygiene and checkup  Vaccinations: Influenza vaccine: Up to date, completed 07/29/2019 Pneumococcal vaccine: Completed series Tdap vaccine: Up to date, completed 10/23/2012 Shingles vaccine: discussed    Advanced directives: Advance directive discussed with you today. Even though you declined this today please call our office should you change your mind and we can give you the proper paperwork for you to fill out.  Conditions/risks identified: Diabetes, Hypertension, Hyperlipidemia  Next appointment: 01/08/2020 @ 8:15 am   Preventive Care 65 Years and Older, Male Preventive care refers to lifestyle choices and visits with your health care provider that can promote health and wellness. What does preventive care include?  A yearly physical exam. This is also called an annual well check.  Dental exams once or twice a year.  Routine eye exams. Ask your health care provider how often you should have your eyes checked.  Personal lifestyle choices, including:  Daily care of your teeth and gums.  Regular physical activity.  Eating a healthy diet.  Avoiding tobacco and drug use.  Limiting alcohol use.  Practicing safe sex.  Taking low doses of aspirin every day.  Taking vitamin and mineral supplements as recommended by your health care provider. What happens during an annual well check? The services and screenings done by your health care provider during your annual well check will depend on your age, overall health, lifestyle risk factors, and family  history of disease. Counseling  Your health care provider may ask you questions about your:  Alcohol use.  Tobacco use.  Drug use.  Emotional well-being.  Home and relationship well-being.  Sexual activity.  Eating habits.  History of falls.  Memory and ability to understand (cognition).  Work and work Statistician. Screening  You may have the following tests or measurements:  Height, weight, and BMI.  Blood pressure.  Lipid and cholesterol levels. These may be checked every 5 years, or more frequently if you are over 15 years old.  Skin check.  Lung cancer screening. You may have this screening every year starting at age 20 if you have a 30-pack-year history of smoking and currently smoke or have quit within the past 15 years.  Fecal occult blood test (FOBT) of the stool. You may have this test every year starting at age 49.  Flexible sigmoidoscopy or colonoscopy. You may have a sigmoidoscopy every 5 years or a colonoscopy every 10 years starting at age 77.  Prostate cancer screening. Recommendations will vary depending on your family history and other risks.  Hepatitis C blood test.  Hepatitis B blood test.  Sexually transmitted disease (STD) testing.  Diabetes screening. This is done by checking your blood sugar (glucose) after you have not eaten for a while (fasting). You may have this done every 1-3 years.  Abdominal aortic aneurysm (AAA) screening. You may need this if you are a current or former smoker.  Osteoporosis. You may be screened starting at age 48 if you are at high risk. Talk with your health care provider about your test results, treatment options, and if necessary, the need for more tests. Vaccines  Your health  care provider may recommend certain vaccines, such as:  Influenza vaccine. This is recommended every year.  Tetanus, diphtheria, and acellular pertussis (Tdap, Td) vaccine. You may need a Td booster every 10 years.  Zoster vaccine.  You may need this after age 89.  Pneumococcal 13-valent conjugate (PCV13) vaccine. One dose is recommended after age 45.  Pneumococcal polysaccharide (PPSV23) vaccine. One dose is recommended after age 69. Talk to your health care provider about which screenings and vaccines you need and how often you need them. This information is not intended to replace advice given to you by your health care provider. Make sure you discuss any questions you have with your health care provider. Document Released: 11/05/2015 Document Revised: 06/28/2016 Document Reviewed: 08/10/2015 Elsevier Interactive Patient Education  2017 [place] Prevention in the Home Falls can cause injuries. They can happen to people of all ages. There are many things you can do to make your home safe and to help prevent falls. What can I do on the outside of my home?  Regularly fix the edges of walkways and driveways and fix any cracks.  Remove anything that might make you trip as you walk through a door, such as a raised step or threshold.  Trim any bushes or trees on the path to your home.  Use bright outdoor lighting.  Clear any walking paths of anything that might make someone trip, such as rocks or tools.  Regularly check to see if handrails are loose or broken. Make sure that both sides of any steps have handrails.  Any raised decks and porches should have guardrails on the edges.  Have any leaves, snow, or ice cleared regularly.  Use sand or salt on walking paths during winter.  Clean up any spills in your garage right away. This includes oil or grease spills. What can I do in the bathroom?  Use night lights.  Install grab bars by the toilet and in the tub and shower. Do not use towel bars as grab bars.  Use non-skid mats or decals in the tub or shower.  If you need to sit down in the shower, use a plastic, non-slip stool.  Keep the floor dry. Clean up any water that spills on the floor as soon  as it happens.  Remove soap buildup in the tub or shower regularly.  Attach bath mats securely with double-sided non-slip rug tape.  Do not have throw rugs and other things on the floor that can make you trip. What can I do in the bedroom?  Use night lights.  Make sure that you have a light by your bed that is easy to reach.  Do not use any sheets or blankets that are too big for your bed. They should not hang down onto the floor.  Have a firm chair that has side arms. You can use this for support while you get dressed.  Do not have throw rugs and other things on the floor that can make you trip. What can I do in the kitchen?  Clean up any spills right away.  Avoid walking on wet floors.  Keep items that you use a lot in easy-to-reach places.  If you need to reach something above you, use a strong step stool that has a grab bar.  Keep electrical cords out of the way.  Do not use floor polish or wax that makes floors slippery. If you must use wax, use non-skid floor wax.  Do not have  throw rugs and other things on the floor that can make you trip. What can I do with my stairs?  Do not leave any items on the stairs.  Make sure that there are handrails on both sides of the stairs and use them. Fix handrails that are broken or loose. Make sure that handrails are as long as the stairways.  Check any carpeting to make sure that it is firmly attached to the stairs. Fix any carpet that is loose or worn.  Avoid having throw rugs at the top or bottom of the stairs. If you do have throw rugs, attach them to the floor with carpet tape.  Make sure that you have a light switch at the top of the stairs and the bottom of the stairs. If you do not have them, ask someone to add them for you. What else can I do to help prevent falls?  Wear shoes that:  Do not have high heels.  Have rubber bottoms.  Are comfortable and fit you well.  Are closed at the toe. Do not wear sandals.  If  you use a stepladder:  Make sure that it is fully opened. Do not climb a closed stepladder.  Make sure that both sides of the stepladder are locked into place.  Ask someone to hold it for you, if possible.  Clearly mark and make sure that you can see:  Any grab bars or handrails.  First and last steps.  Where the edge of each step is.  Use tools that help you move around (mobility aids) if they are needed. These include:  Canes.  Walkers.  Scooters.  Crutches.  Turn on the lights when you go into a dark area. Replace any light bulbs as soon as they burn out.  Set up your furniture so you have a clear path. Avoid moving your furniture around.  If any of your floors are uneven, fix them.  If there are any pets around you, be aware of where they are.  Review your medicines with your doctor. Some medicines can make you feel dizzy. This can increase your chance of falling. Ask your doctor what other things that you can do to help prevent falls. This information is not intended to replace advice given to you by your health care provider. Make sure you discuss any questions you have with your health care provider. Document Released: 08/05/2009 Document Revised: 03/16/2016 Document Reviewed: 11/13/2014 Elsevier Interactive Patient Education  2017 [employer].

## 2020-01-02 NOTE — Progress Notes (Signed)
PCP notes:  Health Maintenance: Colonoscopy- due, wants to discuss with provider first  Eye exam- scheduled for next week per patient   Abnormal Screenings: none   Patient concerns: Acid reflux worse at night time.   Persistent headaches.   Nurse concerns: none   Next PCP appt.: 01/08/2020 @ 8:15 am

## 2020-01-08 ENCOUNTER — Ambulatory Visit (INDEPENDENT_AMBULATORY_CARE_PROVIDER_SITE_OTHER): Payer: PPO | Admitting: Family Medicine

## 2020-01-08 ENCOUNTER — Encounter: Payer: Self-pay | Admitting: Family Medicine

## 2020-01-08 ENCOUNTER — Other Ambulatory Visit: Payer: Self-pay

## 2020-01-08 VITALS — BP 136/64 | HR 49 | Temp 97.5°F | Ht 68.75 in | Wt 232.2 lb

## 2020-01-08 DIAGNOSIS — R0989 Other specified symptoms and signs involving the circulatory and respiratory systems: Secondary | ICD-10-CM

## 2020-01-08 DIAGNOSIS — Z0000 Encounter for general adult medical examination without abnormal findings: Secondary | ICD-10-CM | POA: Diagnosis not present

## 2020-01-08 DIAGNOSIS — K219 Gastro-esophageal reflux disease without esophagitis: Secondary | ICD-10-CM

## 2020-01-08 DIAGNOSIS — I700 Atherosclerosis of aorta: Secondary | ICD-10-CM | POA: Diagnosis not present

## 2020-01-08 DIAGNOSIS — Z77098 Contact with and (suspected) exposure to other hazardous, chiefly nonmedicinal, chemicals: Secondary | ICD-10-CM | POA: Diagnosis not present

## 2020-01-08 DIAGNOSIS — F17200 Nicotine dependence, unspecified, uncomplicated: Secondary | ICD-10-CM

## 2020-01-08 DIAGNOSIS — E669 Obesity, unspecified: Secondary | ICD-10-CM | POA: Diagnosis not present

## 2020-01-08 DIAGNOSIS — I10 Essential (primary) hypertension: Secondary | ICD-10-CM | POA: Diagnosis not present

## 2020-01-08 DIAGNOSIS — J432 Centrilobular emphysema: Secondary | ICD-10-CM | POA: Diagnosis not present

## 2020-01-08 DIAGNOSIS — K760 Fatty (change of) liver, not elsewhere classified: Secondary | ICD-10-CM | POA: Diagnosis not present

## 2020-01-08 DIAGNOSIS — H409 Unspecified glaucoma: Secondary | ICD-10-CM | POA: Diagnosis not present

## 2020-01-08 DIAGNOSIS — E785 Hyperlipidemia, unspecified: Secondary | ICD-10-CM

## 2020-01-08 DIAGNOSIS — D3502 Benign neoplasm of left adrenal gland: Secondary | ICD-10-CM

## 2020-01-08 DIAGNOSIS — Z7189 Other specified counseling: Secondary | ICD-10-CM | POA: Diagnosis not present

## 2020-01-08 DIAGNOSIS — E118 Type 2 diabetes mellitus with unspecified complications: Secondary | ICD-10-CM | POA: Diagnosis not present

## 2020-01-08 DIAGNOSIS — IMO0002 Reserved for concepts with insufficient information to code with codable children: Secondary | ICD-10-CM

## 2020-01-08 DIAGNOSIS — D229 Melanocytic nevi, unspecified: Secondary | ICD-10-CM

## 2020-01-08 DIAGNOSIS — Z7739 Contact with and (suspected) exposure to other war theater: Secondary | ICD-10-CM

## 2020-01-08 DIAGNOSIS — E1165 Type 2 diabetes mellitus with hyperglycemia: Secondary | ICD-10-CM

## 2020-01-08 DIAGNOSIS — E66811 Obesity, class 1: Secondary | ICD-10-CM

## 2020-01-08 MED ORDER — JARDIANCE 10 MG PO TABS: 10.0000 mg | ORAL_TABLET | Freq: Every day | ORAL | 6 refills | Status: DC

## 2020-01-08 NOTE — Assessment & Plan Note (Signed)
Advanced directive discussion - does not want prolonged life support. Would want wife to be HCPOA. Packet previously provided.

## 2020-01-08 NOTE — Progress Notes (Signed)
This visit was conducted in person.  BP 136/64 (BP Location: Left Arm, Patient Position: Sitting, Cuff Size: Large)   Pulse (!) 49   Temp (!) 97.5 F (36.4 C) (Temporal)   Ht 5' 8.75" (1.746 m)   Wt 232 lb 4 oz (105.3 kg)   SpO2 97%   BMI 34.55 kg/m    CC: CPE Subjective:    Patient ID: Evan Avery, male    DOB: [birth year], 74 y.o.   MRN: [id number]  HPI: Evan Avery is a 74 y.o. male presenting on 01/08/2020 for Annual Exam (Prt 2. )   Saw health advisor last week for medicare wellness visit. Note reviewed.   No exam data present    Clinical Support from 01/02/2020 in [place] at [facility] Total Score  0      Fall Risk  01/02/2020 12/27/2018 12/14/2017 12/06/2016 06/21/2016  Falls in the past year? 0 0 No No No  Number falls in past yr: 0 - - - -  Injury with Fall? 0 - - - -  Risk for fall due to : Medication side effect - - - (No Data)  Risk for fall due to: Comment - - - - None  Follow up Falls evaluation completed;Falls prevention discussed - - - -      DM - compliant with metformin and glimepiride. Januvia and ozempic and likely SGLT2 unaffordable. Last visit we encouraged closer adherence to diabetic diet. Doesn't check sugars regularly. Last week fasting cbg 190.   Notes worsening acid reflux over the past 6 months. Started on pepcid 20mg  daily. No dysphagia, early satiety, nausea/vomiting.   Preventative: COLONOSCOPY 11/2017through colostomy -TAx5, sessile serrated polyp, diverticulosis, rpt 2 yrs (Outlaw). Prostate cancer screening - discussed, will continue PSA screen. Lung cancer screening - undergoing first done 03/2019.  Flu shotyearly  Tdap 2014 Pneumovax 2014, prevnar 2015  Completed [place] vaccine 12/2019  shingrix - discussed, declines. Never had chicken pox  Advanced directive discussion - does not want prolonged life support. Would want wife to be HCPOA. Packet previously provided.  Seat belt use discussed    Sunscreen use discussed, no changing moles on skin. Ex smoker - quit 04/2016- restarted 1 ppd. 50+ PY Alcohol - rare Dentist q6 mo - upper dentures Eye exam Q3-4 mo through [place] - glaucoma Bowel - no constipation Bladder - no incontinence  Lives with wife and son Occupation: retired, was Multimedia programmer for [employer] Activity: no regular exercisedue to foot pain Diet: good water, fruits/vegetables daily      Relevant past medical, surgical, family and social history reviewed and updated as indicated. Interim medical history since our last visit reviewed. Allergies and medications reviewed and updated. Outpatient Medications Prior to Visit  Medication Sig Dispense Refill  . aspirin 81 MG tablet Take 81 mg by mouth daily.      Marland Kitchen atenolol (TENORMIN) 50 MG tablet Take 50mg  in the morning and 25mg  at night time 135 tablet 1  . atorvastatin (LIPITOR) 40 MG tablet TAKE 1 TABLET BY MOUTH ONCE DAILY AT  6PM 90 tablet 3  . Brinzolamide-Brimonidine (SIMBRINZA) 1-0.2 % SUSP Apply 1 drop to eye 3 (three) times daily.     . cetirizine (ZYRTEC) 10 MG tablet Take 10 mg by mouth as needed for allergies.    Marland Kitchen clopidogrel (PLAVIX) 75 MG tablet Take 1 tablet by mouth once daily 90 tablet 1  . famotidine (PEPCID) 20 MG tablet Take 1 tablet (20 mg  total) by mouth at bedtime.    Marland Kitchen glimepiride (AMARYL) 4 MG tablet Take 1 tablet by mouth once daily with breakfast 90 tablet 2  . Glucosamine-Chondroit-Vit C-Mn (GLUCOSAMINE 1500 COMPLEX PO) Take 1,500 mg by mouth daily.    Marland Kitchen ibuprofen (ADVIL,MOTRIN) 200 MG tablet Take 600 mg by mouth every 6 (six) hours as needed for mild pain.    Marland Kitchen latanoprost (XALATAN) 0.005 % ophthalmic solution Place 1 drop into both eyes at bedtime.     Marland Kitchen lisinopril (ZESTRIL) 5 MG tablet Take 1 tablet (5 mg total) by mouth daily. 90 tablet 2  . metFORMIN (GLUCOPHAGE) 1000 MG tablet TAKE 1 TABLET BY MOUTH TWICE DAILY WITH MEALS 180 tablet 1  . omega-3 acid ethyl esters (LOVAZA) 1 g capsule  Take 2 capsules (2 g total) by mouth 2 (two) times daily. 360 capsule 2  . pantoprazole (PROTONIX) 40 MG tablet Take 1 tablet (40 mg total) by mouth daily. 30 tablet 6  . senna-docusate (SENOKOT-S) 8.6-50 MG tablet Take 1 tablet by mouth at bedtime as needed for mild constipation. 15 tablet 0  . ezetimibe (ZETIA) 10 MG tablet Take 1 tablet (10 mg total) by mouth daily. 90 tablet 3   No facility-administered medications prior to visit.     Per HPI unless specifically indicated in ROS section below Review of Systems  Constitutional: Negative for activity change, appetite change, chills, fatigue, fever and unexpected weight change.  HENT: Negative for hearing loss.   Eyes: Negative for visual disturbance.  Respiratory: Negative for cough, chest tightness, shortness of breath and wheezing.   Cardiovascular: Negative for chest pain, palpitations and leg swelling.  Gastrointestinal: Negative for abdominal distention, abdominal pain, blood in stool, constipation, diarrhea, nausea and vomiting.  Genitourinary: Negative for difficulty urinating and hematuria.  Musculoskeletal: Negative for arthralgias, myalgias and neck pain.  Skin: Negative for rash.  Neurological: Positive for headaches. Negative for dizziness, seizures and syncope.  Hematological: Negative for adenopathy. Does not bruise/bleed easily.  Psychiatric/Behavioral: Negative for dysphoric mood. The patient is not nervous/anxious.    Objective:    BP 136/64 (BP Location: Left Arm, Patient Position: Sitting, Cuff Size: Large)   Pulse (!) 49   Temp (!) 97.5 F (36.4 C) (Temporal)   Ht 5' 8.75" (1.746 m)   Wt 232 lb 4 oz (105.3 kg)   SpO2 97%   BMI 34.55 kg/m   Wt Readings from Last 3 Encounters:  01/08/20 232 lb 4 oz (105.3 kg)  01/02/20 230 lb (104.3 kg)  11/10/19 230 lb 12.8 oz (104.7 kg)    Physical Exam Vitals and nursing note reviewed.  Constitutional:      General: He is not in acute distress.    Appearance: Normal  appearance. He is well-developed. He is not ill-appearing.  HENT:     Head: Normocephalic and atraumatic.     Right Ear: Hearing, tympanic membrane, ear canal and external ear normal.     Left Ear: Hearing, tympanic membrane, ear canal and external ear normal.     Mouth/Throat:     Pharynx: Uvula midline.  Eyes:     General: No scleral icterus.    Extraocular Movements: Extraocular movements intact.     Conjunctiva/sclera: Conjunctivae normal.     Pupils: Pupils are equal, round, and reactive to light.  Neck:     Vascular: Carotid bruit (R) present.  Cardiovascular:     Rate and Rhythm: Normal rate and regular rhythm.     Pulses: Normal pulses.  Radial pulses are 2+ on the right side and 2+ on the left side.     Heart sounds: Normal heart sounds. No murmur.  Pulmonary:     Effort: Pulmonary effort is normal. No respiratory distress.     Breath sounds: Normal breath sounds. No wheezing, rhonchi or rales.  Abdominal:     General: Abdomen is flat. Bowel sounds are normal. There is no distension.     Palpations: Abdomen is soft. There is no mass.     Tenderness: There is no abdominal tenderness. There is no guarding or rebound.     Hernia: No hernia is present.  Musculoskeletal:        General: Normal range of motion.     Cervical back: Normal range of motion and neck supple.     Right lower leg: No edema.     Left lower leg: No edema.  Lymphadenopathy:     Cervical: No cervical adenopathy.  Skin:    General: Skin is warm and dry.     Findings: No rash.     Comments: Atypical nevus on vertex of skull   Neurological:     General: No focal deficit present.     Mental Status: He is alert and oriented to person, place, and time.     Comments: CN grossly intact, station and gait intact  Psychiatric:        Mood and Affect: Mood normal.        Behavior: Behavior normal.        Thought Content: Thought content normal.        Judgment: Judgment normal.       Results for  orders placed or performed in visit on 01/02/20  CBC with Differential/Platelet  Result Value Ref Range   WBC 7.7 4.0 - 10.5 K/uL   RBC 4.23 4.22 - 5.81 Mil/uL   Hemoglobin 13.6 13.0 - 17.0 g/dL   HCT 39.7 39.0 - 52.0 %   MCV 93.8 78.0 - 100.0 fl   MCHC 34.3 30.0 - 36.0 g/dL   RDW 14.4 11.5 - 15.5 %   Platelets 144.0 (L) 150.0 - 400.0 K/uL   Neutrophils Relative % 65.2 43.0 - 77.0 %   Lymphocytes Relative 20.5 12.0 - 46.0 %   Monocytes Relative 7.9 3.0 - 12.0 %   Eosinophils Relative 4.7 0.0 - 5.0 %   Basophils Relative 1.7 0.0 - 3.0 %   Neutro Abs 5.0 1.4 - 7.7 K/uL   Lymphs Abs 1.6 0.7 - 4.0 K/uL   Monocytes Absolute 0.6 0.1 - 1.0 K/uL   Eosinophils Absolute 0.4 0.0 - 0.7 K/uL   Basophils Absolute 0.1 0.0 - 0.1 K/uL  PSA  Result Value Ref Range   PSA 0.52 0.10 - 4.00 ng/mL  Hemoglobin A1c  Result Value Ref Range   Hgb A1c MFr Bld 9.2 (H) 4.6 - 6.5 %  Comprehensive metabolic panel  Result Value Ref Range   Sodium 139 135 - 145 mEq/L   Potassium 4.7 3.5 - 5.1 mEq/L   Chloride 104 96 - 112 mEq/L   CO2 28 19 - 32 mEq/L   Glucose, Bld 238 (H) 70 - 99 mg/dL   BUN 20 6 - 23 mg/dL   Creatinine, Ser 1.05 0.40 - 1.50 mg/dL   Total Bilirubin 0.7 0.2 - 1.2 mg/dL   Alkaline Phosphatase 64 39 - 117 U/L   AST 13 0 - 37 U/L   ALT 17 0 - 53 U/L   Total  Protein 6.3 6.0 - 8.3 g/dL   Albumin 4.0 3.5 - 5.2 g/dL   GFR 68.97 >60.00 mL/min   Calcium 9.5 8.4 - 10.5 mg/dL  Lipid panel  Result Value Ref Range   Cholesterol 75 0 - 200 mg/dL   Triglycerides 156.0 (H) 0.0 - 149.0 mg/dL   HDL 25.80 (L) >39.00 mg/dL   VLDL 31.2 0.0 - 40.0 mg/dL   LDL Cholesterol 18 0 - 99 mg/dL   Total CHOL/HDL Ratio 3    NonHDL 49.38    Assessment & Plan:  This visit occurred during the SARS-CoV-2 public health emergency.  Safety protocols were in place, including screening questions prior to the visit, additional usage of staff PPE, and extensive cleaning of exam room while observing appropriate contact  time as indicated for disinfecting solutions.   Problem List Items Addressed This Visit    Thoracic aorta atherosclerosis ([place])    Continue aspirin, palvix, statin.      Smoker    Continue to encourage full smoking cessation. Has quit previously but restarted 2019.  Undergoing lulng cancer screening - started 03/2019      Obesity, Class I, BMI 30.0-34.9 (see actual BMI)    Continue to encourage efforts towards sustainable weight loss       Hyperlipidemia with target LDL less than 70    Continue lipitor, zetia, lovaza. Was unable to afford vascepa. LDL at goal.  The ASCVD Risk score Mikey Bussing DC Jr., et al., 2013) failed to calculate for the following reasons:   The valid total cholesterol range is 130 to 320 mg/dL       Health maintenance examination - Primary    Preventative protocols reviewed and updated unless pt declined. Discussed healthy diet and lifestyle.       Glaucoma    Followed by [facility] - upcoming appt today.       GERD (gastroesophageal reflux disease)    Breakthrough symptoms - will verify he's taking both omeprazole and pepcid daily - if so, may need to increase omeprazole to BID.       Fatty liver    By [place], LFTs normal. Low platelets noted. Continue to monitor with regular LFTs.       Essential hypertension    Stable. Continue current regimen.       Diabetes mellitus type 2, uncontrolled, with complications (HCC)    Chronic, uncontrolled. So far weekly GLP1 RA unaffordable. Will have him price out jardiance. Reviewed increased risk of UTI, yeast infection, groin infection. Encouraged renewed efforts towards diabetic diet.       Relevant Medications   empagliflozin (JARDIANCE) 10 MG TABS tablet   COPD (chronic obstructive pulmonary disease) ([place])    By imaging. Stable off respiratory medication.       Atypical nevus    Encouraged he schedule derm eval.       Agent orange exposure   Advanced care planning/counseling discussion    Advanced directive  discussion - does not want prolonged life support. Would want wife to be HCPOA. Packet previously provided.      Adenoma of left adrenal gland    Consider evaluation for hormonal hypersecretion: DHEAS and 1mg  overnight DST  24 hr urine fractionated metanephrines and catecholamines OR plasma fractionated metanephrines (if >10HU by CT)  Plasma aldosterone concentration and plasma renin activity (if hypertensive or hypokalemic)        Other Visit Diagnoses    Right carotid bruit       Relevant Orders  VAS US CAROTID       Meds ordered this encounter  Medications  . empagliflozin (JARDIANCE) 10 MG TABS tablet    Sig: Take 10 mg by mouth daily before breakfast.    Dispense:  30 tablet    Refill:  6    Price out vs invokana or farxiga -whichever is more affordable.   No orders of the defined types were placed in this encounter.   Patient instructions: March Rummage out new diabetes medicine jardiance (or farxiga or invokana - all in same family). Continue working on weight and limiting added sugars, carbs in diet.  For heartburn - make sure you're taking both pantoprazole and pepcid (2 heartburn medicines). If you are, let me know - we may need to increase pantoprazole to twice daily.  Call [facility] to schedule colonoscopy.  Work on advanced directive at home.  I'm glad you have an upcoming appointment to see eye doctor.  We will order carotid ultrasound to evaluate for plaque.  Return in 3 months for diabetes follow up visit  Schedule skin exam with skin doctor  Follow up plan: Return in about 3 months (around 04/09/2020), or if symptoms worsen or fail to improve, for follow up visit.  [name], MD

## 2020-01-08 NOTE — Patient Instructions (Addendum)
Price out new diabetes medicine jardiance (or farxiga or invokana - all in same family). Continue working on weight and limiting added sugars, carbs in diet.  For heartburn - make sure you're taking both pantoprazole and pepcid (2 heartburn medicines). If you are, let me know - we may need to increase pantoprazole to twice daily.  Call [facility] to schedule colonoscopy.  Work on advanced directive at home.  I'm glad you have an upcoming appointment to see eye doctor.  We will order carotid ultrasound to evaluate for plaque.  Return in 3 months for diabetes follow up visit  Schedule skin exam with skin doctor  Health Maintenance After Age 62 After age 4, you are at a higher risk for certain long-term diseases and infections as well as injuries from falls. Falls are a major cause of broken bones and head injuries in people who are older than age 37. Getting regular preventive care can help to keep you healthy and well. Preventive care includes getting regular testing and making lifestyle changes as recommended by your health care provider. Talk with your health care provider about:  Which screenings and tests you should have. A screening is a test that checks for a disease when you have no symptoms.  A diet and exercise plan that is right for you. What should I know about screenings and tests to prevent falls? Screening and testing are the best ways to find a health problem early. Early diagnosis and treatment give you the best chance of managing medical conditions that are common after age 69. Certain conditions and lifestyle choices may make you more likely to have a fall. Your health care provider may recommend:  Regular vision checks. Poor vision and conditions such as cataracts can make you more likely to have a fall. If you wear glasses, make sure to get your prescription updated if your vision changes.  Medicine review. Work with your health care provider to regularly review all of the  medicines you are taking, including over-the-counter medicines. Ask your health care provider about any side effects that may make you more likely to have a fall. Tell your health care provider if any medicines that you take make you feel dizzy or sleepy.  Osteoporosis screening. Osteoporosis is a condition that causes the bones to get weaker. This can make the bones weak and cause them to break more easily.  Blood pressure screening. Blood pressure changes and medicines to control blood pressure can make you feel dizzy.  Strength and balance checks. Your health care provider may recommend certain tests to check your strength and balance while standing, walking, or changing positions.  Foot health exam. Foot pain and numbness, as well as not wearing proper footwear, can make you more likely to have a fall.  Depression screening. You may be more likely to have a fall if you have a fear of falling, feel emotionally low, or feel unable to do activities that you used to do.  Alcohol use screening. Using too much alcohol can affect your balance and may make you more likely to have a fall. What actions can I take to lower my risk of falls? General instructions  Talk with your health care provider about your risks for falling. Tell your health care provider if: ? You fall. Be sure to tell your health care provider about all falls, even ones that seem minor. ? You feel dizzy, sleepy, or off-balance.  Take over-the-counter and prescription medicines only as told by your  health care provider. These include any supplements.  Eat a healthy diet and maintain a healthy weight. A healthy diet includes low-fat dairy products, low-fat (lean) meats, and fiber from whole grains, beans, and lots of fruits and vegetables. Home safety  Remove any tripping hazards, such as rugs, cords, and clutter.  Install safety equipment such as grab bars in bathrooms and safety rails on stairs.  Keep rooms and walkways  well-lit. Activity   Follow a regular exercise program to stay fit. This will help you maintain your balance. Ask your health care provider what types of exercise are appropriate for you.  If you need a cane or walker, use it as recommended by your health care provider.  Wear supportive shoes that have nonskid soles. Lifestyle  Do not drink alcohol if your health care provider tells you not to drink.  If you drink alcohol, limit how much you have: ? 0-1 drink a day for women. ? 0-2 drinks a day for men.  Be aware of how much alcohol is in your drink. In the [place] one drink equals one typical bottle of beer (12 oz), one-half glass of wine (5 oz), or one shot of hard liquor (1 oz).  Do not use any products that contain nicotine or tobacco, such as cigarettes and e-cigarettes. If you need help quitting, ask your health care provider. Summary  Having a healthy lifestyle and getting preventive care can help to protect your health and wellness after age 17.  Screening and testing are the best way to find a health problem early and help you avoid having a fall. Early diagnosis and treatment give you the best chance for managing medical conditions that are more common for people who are older than age 10.  Falls are a major cause of broken bones and head injuries in people who are older than age 53. Take precautions to prevent a fall at home.  Work with your health care provider to learn what changes you can make to improve your health and wellness and to prevent falls. This information is not intended to replace advice given to you by your health care provider. Make sure you discuss any questions you have with your health care provider. Document Revised: 01/30/2019 Document Reviewed: 08/22/2017 Elsevier Patient Education  2020 [employer].

## 2020-01-08 NOTE — Assessment & Plan Note (Signed)
Followed by [facility] - upcoming appt today.

## 2020-01-08 NOTE — Assessment & Plan Note (Signed)
Preventative protocols reviewed and updated unless pt declined. Discussed healthy diet and lifestyle.  

## 2020-01-11 DIAGNOSIS — D229 Melanocytic nevi, unspecified: Secondary | ICD-10-CM | POA: Insufficient documentation

## 2020-01-11 NOTE — Assessment & Plan Note (Addendum)
Consider evaluation for hormonal hypersecretion: DHEAS and 1mg  overnight DST  24 hr urine fractionated metanephrines and catecholamines OR plasma fractionated metanephrines (if >10HU by CT)  Plasma aldosterone concentration and plasma renin activity (if hypertensive or hypokalemic)

## 2020-01-11 NOTE — Assessment & Plan Note (Addendum)
Continue lipitor, zetia, lovaza. Was unable to afford vascepa. LDL at goal.  The ASCVD Risk score Evan Bussing DC Jr., et al., 2013) failed to calculate for the following reasons:   The valid total cholesterol range is 130 to 320 mg/dL

## 2020-01-11 NOTE — Assessment & Plan Note (Addendum)
Continue to encourage full smoking cessation. Has quit previously but restarted 2019.  Undergoing lulng cancer screening - started 03/2019

## 2020-01-11 NOTE — Assessment & Plan Note (Signed)
Encouraged he schedule derm eval.

## 2020-01-11 NOTE — Assessment & Plan Note (Signed)
Continue aspirin, palvix, statin.

## 2020-01-11 NOTE — Assessment & Plan Note (Signed)
By imaging. Stable off respiratory medication.

## 2020-01-11 NOTE — Assessment & Plan Note (Addendum)
Continue to encourage efforts towards sustainable weight loss

## 2020-01-11 NOTE — Assessment & Plan Note (Signed)
Chronic, uncontrolled. So far weekly GLP1 RA unaffordable. Will have him price out jardiance. Reviewed increased risk of UTI, yeast infection, groin infection. Encouraged renewed efforts towards diabetic diet.

## 2020-01-11 NOTE — Assessment & Plan Note (Signed)
Breakthrough symptoms - will verify he's taking both omeprazole and pepcid daily - if so, may need to increase omeprazole to BID.

## 2020-01-11 NOTE — Assessment & Plan Note (Signed)
Stable  Continue current regimen  

## 2020-01-11 NOTE — Assessment & Plan Note (Signed)
By [place], LFTs normal. Low platelets noted. Continue to monitor with regular LFTs.

## 2020-01-28 ENCOUNTER — Other Ambulatory Visit: Payer: Self-pay | Admitting: *Deleted

## 2020-01-28 MED ORDER — PANTOPRAZOLE SODIUM 40 MG PO TBEC: 40.0000 mg | DELAYED_RELEASE_TABLET | Freq: Every day | ORAL | 6 refills | Status: DC

## 2020-01-28 NOTE — Telephone Encounter (Signed)
Rx has been sent to the pharmacy electronically. ° °

## 2020-02-02 ENCOUNTER — Ambulatory Visit (INDEPENDENT_AMBULATORY_CARE_PROVIDER_SITE_OTHER): Payer: PPO

## 2020-02-02 ENCOUNTER — Other Ambulatory Visit: Payer: Self-pay

## 2020-02-02 DIAGNOSIS — R0989 Other specified symptoms and signs involving the circulatory and respiratory systems: Secondary | ICD-10-CM | POA: Diagnosis not present

## 2020-02-08 ENCOUNTER — Encounter: Payer: Self-pay | Admitting: Family Medicine

## 2020-02-08 DIAGNOSIS — I771 Stricture of artery: Secondary | ICD-10-CM | POA: Insufficient documentation

## 2020-02-14 ENCOUNTER — Other Ambulatory Visit: Payer: Self-pay | Admitting: Family Medicine

## 2020-02-24 ENCOUNTER — Telehealth: Payer: Self-pay | Admitting: Family Medicine

## 2020-02-24 NOTE — Progress Notes (Signed)
  Chronic Care Management   Note  02/24/2020 Name: Evan Avery MRN: [id number] DOB: 29-Mar-1946  Evan Avery is a 74 y.o. year old male who is a primary care patient of [name], MD. I reached out to [name] by phone today in response to a referral sent by Evan Avery PCP, [name], MD.   Mr. Hailes was given information about Chronic Care Management services today including:  1. CCM service includes personalized support from designated clinical staff supervised by his physician, including individualized plan of care and coordination with other care providers 2. 24/7 contact phone numbers for assistance for urgent and routine care needs. 3. Service will only be billed when office clinical staff spend 20 minutes or more in a month to coordinate care. 4. Only one practitioner may furnish and bill the service in a calendar month. 5. The patient may stop CCM services at any time (effective at the end of the month) by phone call to the office staff.   Patient agreed to services and verbal consent obtained.    This note is not being shared with the patient for the following reason: To respect privacy (The patient or proxy has requested that the information not be shared).  Follow up plan:   Raynicia Dukes [employer] Scheduler

## 2020-02-27 ENCOUNTER — Other Ambulatory Visit: Payer: Self-pay | Admitting: Family Medicine

## 2020-03-12 LAB — HEMOGLOBIN A1C: Hemoglobin A1C: 8.2

## 2020-03-12 LAB — HEPATIC FUNCTION PANEL
ALT: 35 (ref 10–40)
AST: 17 (ref 14–40)
Alkaline Phosphatase: 63 (ref 25–125)
Bilirubin, Direct: 0.1 (ref 0.01–0.4)
Bilirubin, Total: 0.4

## 2020-03-12 LAB — TSH: TSH: 0.9 (ref 0.41–5.90)

## 2020-03-12 LAB — LIPID PANEL
Cholesterol: 78 (ref 0–200)
HDL: 30 — AB (ref 35–70)
LDL Cholesterol: 75
Triglycerides: 208 — AB (ref 40–160)

## 2020-03-12 LAB — BASIC METABOLIC PANEL
Creatinine: 1.2 (ref 0.6–1.3)
Glucose: 152
Potassium: 4.9 (ref 3.4–5.3)
Sodium: 139 (ref 137–147)

## 2020-03-12 LAB — VITAMIN B12: Vitamin B-12: 816

## 2020-03-12 LAB — CBC AND DIFFERENTIAL
Hemoglobin: 14 (ref 13.5–17.5)
Platelets: 133 — AB (ref 150–399)
WBC: 8.2

## 2020-03-12 LAB — VITAMIN D 25 HYDROXY (VIT D DEFICIENCY, FRACTURES): Vit D, 25-Hydroxy: 38.51

## 2020-03-12 LAB — IRON,TIBC AND FERRITIN PANEL: Ferritin: 33.7

## 2020-03-31 ENCOUNTER — Other Ambulatory Visit: Payer: Self-pay | Admitting: Family Medicine

## 2020-04-01 ENCOUNTER — Telehealth: Payer: Self-pay

## 2020-04-01 NOTE — Telephone Encounter (Signed)
Patient notified that it is time to schedule the low dose lung cancer screening CT scan.  He does not want to schedule at this time and would like to wait to discuss with his MD before getting scheduled.

## 2020-04-04 ENCOUNTER — Telehealth: Payer: Self-pay

## 2020-04-04 DIAGNOSIS — J432 Centrilobular emphysema: Secondary | ICD-10-CM

## 2020-04-04 DIAGNOSIS — I10 Essential (primary) hypertension: Secondary | ICD-10-CM

## 2020-04-04 NOTE — Telephone Encounter (Signed)
Per written referral from PCP, requesting referral in Epic for [name] to chronic care management pharmacy services for the following conditions:   Essential hypertension, benign  [I10]  COPD [J44.9]  [name], PharmD Clinical Pharmacist [facility] [phone]

## 2020-04-05 NOTE — Addendum Note (Signed)
Addended by: [name] on: 04/05/2020 04:12 PM   Modules accepted: Orders

## 2020-04-05 NOTE — Chronic Care Management (AMB) (Signed)
Chronic Care Management Pharmacy  Name: Evan Avery  MRN: [id number] DOB: 1946/07/08  Chief Complaint/ HPI  Evan Avery,  74 y.o., male presents for their Initial CCM visit with the clinical pharmacist via telephone.  PCP : Evan Bush, MD  Their chronic conditions include: hypertension, CAD, OSA on CPAP, COPD, fatty liver, GERD, type 2 diabetes, hyperlipidemia, tobacco use, glaucoma  Patient concerns: Reports 3-4 headaches/day began around 08/2019 after starting pantoprazole. Pt stopped pantoprazole several months ago and headache frequency reduced to ~2/day. [facility] and [facility] did not think it was related to his glaucoma or vision. He has been referred to neurology by the [facility] but earliest appt is Sept 2021. He is waiting to hear back about a referral outside of the [place]. Reports taking Tylenol primarily for headaches but ibuprofen occasionally. Active in yard on daily basis. Reports 8-10 glasses of water daily. Pt has f/u with PCP on 04/09/20. Reviewed medications, med changes in past year include pantoprazole and Jardiance. Jardiance started in March so unlikely cause but did encourage hydration and monitoring blood pressure. Pantoprazole may cause headaches, although discontinued and headaches still bothersome.   Office Visits:  01/08/20: PCP visit - DM compliant on metformin and glimepiride, cost concerns with starting new treatment, GLP1 unaffordable does not check sugars regularly, last CBG 190, price out Jardiance; acid reflux worsening, added pepcid 20 mg at last visit, verify pt is taking both then consider BID PPI, glaucoma followed by [facility]  09/03/19: PCP visit - DM, both Januvia and Ozempic unaffordable, GERD, started on pantoprazole 40 mg daily per cardio due to chest pain last month with benefit, also started on Zetia per cardio, split atenolol 75 mg daily to 50 mg AM and 25 mg PM to see if bradycardia improves, add Pepicd for  GERD breakthrough symptoms  Consult Visit:  11/10/19: Cardiology - CAD 11 month follow up, continues on DAPT due to multiple stents, BP controlled on atenolol and lisinopril, asymptomatic bradycardia, recommend close home monitoring, denies angina, CPAP through [place], 100% compliant, continue to smoke cigarettes  Allergies  Allergen Reactions  . Dilaudid [Hydromorphone Hcl] Itching  . Ambien [Zolpidem] Other (See Comments)    Pt states "makes me wild"  . Niacin And Related Other (See Comments)    Extreme fatigue   Medications: Outpatient Encounter Medications as of 04/06/2020  Medication Sig  . aspirin 81 MG tablet Take 81 mg by mouth daily.    Marland Kitchen atenolol (TENORMIN) 50 MG tablet TAKE 1 TABLET BY MOUTH IN THE MORNING AND 1/2 (ONE-HALF) AT NIGHT  . atorvastatin (LIPITOR) 40 MG tablet TAKE 1 TABLET BY MOUTH ONCE DAILY AT  6PM  . Brinzolamide-Brimonidine (SIMBRINZA) 1-0.2 % SUSP Apply 1 drop to eye 3 (three) times daily.   . cetirizine (ZYRTEC) 10 MG tablet Take 10 mg by mouth as needed for allergies.  Marland Kitchen clopidogrel (PLAVIX) 75 MG tablet Take 1 tablet by mouth once daily  . ezetimibe (ZETIA) 10 MG tablet Take 1 tablet (10 mg total) by mouth daily.  Marland Kitchen glimepiride (AMARYL) 4 MG tablet Take 1 tablet by mouth once daily with breakfast  . Glucosamine-Chondroit-Vit C-Mn (GLUCOSAMINE 1500 COMPLEX PO) Take 1,500 mg by mouth daily.  Marland Kitchen ibuprofen (ADVIL,MOTRIN) 200 MG tablet Take 600 mg by mouth every 6 (six) hours as needed for mild pain.  Marland Kitchen latanoprost (XALATAN) 0.005 % ophthalmic solution Place 1 drop into both eyes at bedtime.   Marland Kitchen lisinopril (ZESTRIL)  5 MG tablet Take 1 tablet by mouth once daily  . metFORMIN (GLUCOPHAGE) 1000 MG tablet TAKE 1 TABLET BY MOUTH TWICE DAILY WITH MEALS  . omega-3 acid ethyl esters (LOVAZA) 1 g capsule Take 2 capsules by mouth twice daily  . senna-docusate (SENOKOT-S) 8.6-50 MG tablet Take 1 tablet by mouth at bedtime as needed for mild constipation.  . famotidine  (PEPCID) 20 MG tablet Take 1 tablet (20 mg total) by mouth at bedtime. (Patient not taking: Reported on 04/06/2020)  . pantoprazole (PROTONIX) 40 MG tablet Take 1 tablet (40 mg total) by mouth daily. (Patient not taking: Reported on 04/06/2020)  . [DISCONTINUED] atenolol (TENORMIN) 50 MG tablet Take 50mg  in the morning and 25mg  at night time  . [DISCONTINUED] clopidogrel (PLAVIX) 75 MG tablet Take 1 tablet by mouth once daily  . [DISCONTINUED] empagliflozin (JARDIANCE) 10 MG TABS tablet Take 10 mg by mouth daily before breakfast.   No facility-administered encounter medications on file as of 04/06/2020.   Current Diagnosis/Assessment:   Emergency planning/management officer Strain: Medium Risk  . Difficulty of Paying Living Expenses: Somewhat hard   Goals Addressed            This Visit's Progress   . Pharmacy Care Plan       CARE PLAN ENTRY  Current Barriers:  . Chronic Disease Management support, education, and care coordination needs related to Hypertension, Hyperlipidemia, Diabetes, and GERD   Hypertension BP Readings from Last 3 Encounters:  01/08/20 136/64  01/02/20 (!) 161/75  11/10/19 (!) 142/71 .  Pharmacist Clinical Goal(s): o Over the next 30 days, patient will work with PharmD and providers to maintain BP goal <140/90 mmHg . Current regimen:   Atenolol 50 mg - 1 tablet every morning and 1/2 tablet every evening  Lisinopril 5 mg - 1 tablet daily . Interventions: o Recommend checking blood pressure after blood glucose check 3-4 days per week to ensure heart rate and blood pressure are within goal and not contributing to headaches . Patient self care activities - Over the next 30 days, patient will: o Check blood pressure 3-4 days per week, document, and provide at future appointment. Check the same time each day.  o Ensure daily salt intake < 2300 mg/day  Hyperlipidemia Lab Results  Component Value Date/Time   LDLCALC 18 01/02/2020 08:35 AM   LDLCALC 46 03/13/2016 08:03 AM    LDLDIRECT 61.0 12/27/2018 12:45 PM .  Pharmacist Clinical Goal(s): o Over the next 30 days, patient will work with PharmD and providers to maintain LDL goal < 70, TG < 150, and HDL > 40 . Current regimen:  . Atorvastatin 40 mg - 1 tablet daily . Lovaza 1 gm - 2 capsules BID . Ezetimibe 10 mg - 1 tablet daily  . Interventions: o Continue current medications as LDL is at goal and TG improved  . Patient self care activities - Over the next 30 days, patient will: o Continue medications as prescribed  Diabetes Lab Results  Component Value Date/Time   HGBA1C 9.2 (H) 01/02/2020 08:35 AM   HGBA1C 8.9 (A) 09/03/2019 01:20 PM   HGBA1C 8.4 02/12/2019 12:00 AM   HGBA1C 8.8 (H) 12/27/2018 12:45 PM .  Pharmacist Clinical Goal(s): o Over the next 30 days, patient will work with PharmD and providers to achieve A1c goal <7% . Current regimen:   Jardiance 10 mg - 1 tablet daily  Glimepiride 4 mg - 1 tablet daily  Metformin 1000 mg - 1 tablet twice daily  .  Interventions: o Consult with PCP to increase Jardiance dose . Patient self care activities - Over the next 30 days, patient will: o Check blood sugar 3-4 days per week, before breakfast, document, and provide at future appointment with pharmacist o Contact provider with any episodes of hypoglycemia  Acid Reflux . Pharmacist Clinical Goal(s) o Over the next 30 days, patient will work with PharmD and providers to improve acid reflux symptoms  . Current regimen:  o Tums as needed . Interventions: o Reviewed triggers, lifestyle changes, and treatment options . Patient self care activities - Over the next 30 days, patient will: o Try cutting back on coffee intake to see if symptoms improve; Watch for other possible triggers including high fat meals, chocolate, fried foods, peppermint, carbonated beverages, acidic foods.  o Eat small meals, avoid lying down for 2 hours hours after eating, consider quitting smoking as this can worsen acid reflux   o Try over the counter famotidine 20 mg (Pepcid) twice daily before meals. If ineffective after 2 weeks, consider trial of different medication.   Initial goal documentation      Hypertension   CMP Latest Ref Rng & Units 01/02/2020 02/18/2019 12/27/2018  Glucose 70 - 99 mg/dL 238(H) - 151(H)  BUN 6 - 23 mg/dL 20 - 15  Creatinine 0.40 - 1.50 mg/dL 1.05 1.1 1.12  Sodium 135 - 145 mEq/L 139 - 138  Potassium 3.5 - 5.1 mEq/L 4.7 4.7 5.2(H)  Chloride 96 - 112 mEq/L 104 - 103  CO2 19 - 32 mEq/L 28 - 29  Calcium 8.4 - 10.5 mg/dL 9.5 - 9.9  Total Protein 6.0 - 8.3 g/dL 6.3 - 6.8  Total Bilirubin 0.2 - 1.2 mg/dL 0.7 - 0.5  Alkaline Phos 39 - 117 U/L 64 71 68  AST 0 - 37 U/L 13 15 14   ALT 0 - 53 U/L 17 30 23    Office blood pressures are: BP Readings from Last 3 Encounters:  01/08/20 136/64  01/02/20 (!) 161/75  11/10/19 (!) 142/71   Patient has failed these meds in the past: none reported Patient checks BP at home infrequently Patient home BP readings are ranging: none reported, does not check HR   BP goal < 140/90 mmHg Patient is currently controlled on the following medications:   Atenolol 50 mg - 1 tablet every morning and 1/2 tablet every evening  Lisinopril 5 mg - 1 tablet daily  We discussed: encouraged home monitoring of BP and heart rate; history of bradycardia on atenolol, dose reduced and split to BID to reduce bradycardia  Plan: Continue current medications  Hyperlipidemia   Lipid Panel     Component Value Date/Time   CHOL 75 01/02/2020 0835   CHOL 149 03/13/2016 0803   TRIG 156.0 (H) 01/02/2020 0835   TRIG 387 (H) 03/13/2016 0803   HDL 25.80 (L) 01/02/2020 0835   HDL 26 (L) 03/13/2016 0803   LDLCALC 18 01/02/2020 0835   LDLCALC 46 03/13/2016 0803   LDLDIRECT 61.0 12/27/2018 1245    CBC Latest Ref Rng & Units 01/02/2020 02/18/2019 03/23/2017  WBC 4.0 - 10.5 K/uL 7.7 7.2 5.3  Hemoglobin 13.0 - 17.0 g/dL 13.6 13.2(A) 12.9(L)  Hematocrit 39 - 52 % 39.7 - 38.0(L)    Platelets 150 - 400 K/uL 144.0(L) 147(A) 153.0   LDL goal < 70 (CAD) Patient has failed these meds in past: Vascepa - cost  Patient is currently controlled on the following medications:  . Atorvastatin 40 mg - 1 tablet  daily . Lovaza 1 gm - 2 capsules BID . Ezetimibe 10 mg - 1 tablet daily . Aspirin 81 mg - 1 tablet daily . Clopidogrel 75 mg - 1 tablet daily  Adherence: confirmed, started ezetimibe most recently (08/2019) We discussed: reports new lipid panel from [place] labs, will bring to PCP appt this week Safety: CBC stable, denies abnormal bruising or bleeding, DDI - taking Tylenol and ibuprofen PRN headaches or pain, recommended favoring Tylenol and using ibuprofen only if necessary less than once weekly.   Plan: Continue current medications; Limit NSAID use.   COPD / Tobacco   Last spirometry score: none, diagnosed per imaging  Eosinophil count:   Lab Results  Component Value Date/Time   EOSPCT 4.7 01/02/2020 08:35 AM  %                               Eos (Absolute):  Lab Results  Component Value Date/Time   EOSABS 0.4 01/02/2020 08:35 AM   Tobacco Status:  Social History   Tobacco Use  Smoking Status Current Every Day Smoker  . Packs/day: 1.00  . Years: 50.00  . Pack years: 50.00  . Types: Cigarettes  . Start date: 10/23/1958  Smokeless Tobacco Never Used   Patient has failed these meds in past: none reported Patient is currently controlled on the following medications:   No pharmacotherapy  We discussed: denies SOB, uses Zyrtec PRN Tobacco use: current 1 PPD, interested in quitting, recently received Nicotine patches from [place] and discussing with family quitting together (wife and son smoke as well)  Plan: Continue current medications; Encourage tobacco cessation.  Diabetes   Recent Relevant Labs: Lab Results  Component Value Date/Time   HGBA1C 9.2 (H) 01/02/2020 08:35 AM   HGBA1C 8.9 (A) 09/03/2019 01:20 PM   HGBA1C 8.4 02/12/2019 12:00 AM   HGBA1C 8.8  (H) 12/27/2018 12:45 PM   MICROALBUR 0.2 12/27/2007 10:00 AM    Checked A1c at [facility] 3-4 weeks ago --> 8.4% (improving) Checking BG: weekly Recent FBG Readings: 150-200s  Denies hypoglycemia  A1c goal < 7% Patient has failed these meds in past: Ozempic (denies trying, reports cost concern) Patient is currently uncontrolled on the following medications:   Jardiance 10 mg - 1 tablet daily  Glimepiride 4 mg - 1 tablet daily  Metformin 1000 mg - 1 tablet BID   Last diabetic eye exam: done through [place] in past month    Last diabetic foot exam:  Lab Results  Component Value Date/Time   HMDIABFOOTEX done 08/24/2009 12:00 AM    We discussed: may benefit from increasing Jardiance to 25 mg - check fasting BG 3-4 days per week for next 4 weeks Adherence: confirms adherence, denies current cost concerns; Jardiance Cost - $90 for 90 DS  Plan: Continue current medications. Consult with PCP to increase Jardiance dose. Follow up in 4 weeks for BG log.   Glaucoma   Managed by [facility]  Patient has failed these meds in past: none reported Patient is currently on the following medications:   Simbrinza 1-0.2% suspension - apply 1 drop in both eyes TID   Latanoprost 0.005% - apply 1 drop in both eyes at bedtime   We discussed[facility]  Plan: Continue follow up with speciality   GERD   Has been referred to [facility] gastroenterologist  Patient has failed these meds in past: none  Patient is currently uncontrolled on the  following medications:   Pantoprazole 40 mg - 1 tablet daily before breakfast (not taking)   Famotidine 20 mg - 1 tablet daily at bedtime (not taking)  Assessment:  . Adherence:stopped pantoprazole 3 months ago due to headaches, denies trying famotidine although PCP recommended . Symptoms: severe chest pain/burning in throat almost daily, radiates down left arm (evaluated by cardiology 11/20) . Triggers: worse with spicy foods, reviewed other common triggers and  pt reports drinking coffee daily (5-6 16 oz cups/day), recommended trial of reducing coffee intake  . OTCs: currently drinking aloe vera juice from pharmacy, has not noticed much improvement; using Tums several days per week   Plan: Recommend reducing coffee intake. Trial famotidine 20 mg (Pepcid) OTC twice daily before meals If ineffective after 2 weeks, consider trial of different PPI.   Misc Medications   Patient is currently on the following medications:   Senokot-S - 1 tablet PRN (infrequent use)  Glucosamine Complex - 2 tablets daily   CoQ10 200 mg - 1 capsule daily  We discussed: confirmed above medications, taking glucosamine and CoQ10 for arthritis; reviewed and no DDIs noted  Plan: Continue current medications  Vaccines   Reviewed and discussed patient's vaccination history.    Immunization History  Administered Date(s) Administered  . Fluad Quad(high Dose 65+) 07/29/2019  . Influenza Split 08/02/2012  . Influenza,inj,Quad PF,6+ Mos 09/17/2013, 09/07/2014, 09/06/2016, 11/07/2017  . Influenza-Unspecified 09/28/2015, 07/23/2018  . PFIZER SARS-COV-2 Vaccination 12/15/2019, 01/05/2020  . Pneumococcal Conjugate-13 10/23/2013  . Pneumococcal-Unspecified 10/23/2012  . Td 10/23/2000  . Tdap 10/23/2012   Plan: Recommended patient receive Shingrix   Medication Management  Pharmacy/Benefits: [facility]  Adherence: refills timely, no concerns   Affordability: denies concerns  CCM Follow Up:  05/06/20 at 1:00 PM (telephone visit)  [name], PharmD Clinical Pharmacist [facility] at [facility] [phone]

## 2020-04-05 NOTE — Telephone Encounter (Signed)
Please advise 

## 2020-04-06 ENCOUNTER — Ambulatory Visit: Payer: PPO

## 2020-04-06 ENCOUNTER — Telehealth: Payer: Self-pay

## 2020-04-06 ENCOUNTER — Other Ambulatory Visit: Payer: Self-pay

## 2020-04-06 DIAGNOSIS — I10 Essential (primary) hypertension: Secondary | ICD-10-CM

## 2020-04-06 DIAGNOSIS — E785 Hyperlipidemia, unspecified: Secondary | ICD-10-CM

## 2020-04-06 DIAGNOSIS — IMO0002 Reserved for concepts with insufficient information to code with codable children: Secondary | ICD-10-CM

## 2020-04-06 MED ORDER — EMPAGLIFLOZIN 25 MG PO TABS: 25.0000 mg | ORAL_TABLET | Freq: Every day | ORAL | 6 refills | Status: DC

## 2020-04-06 NOTE — Telephone Encounter (Addendum)
PCP consult regarding CCM visit 04/06/20:  Pt reports doing well on Jardiance 10 mg daily, denies adverse effects, affordable for now (concerned about coverage gap later in the year). Continues metformin 1000 mg BID and glimepiride 4 mg daily.  [name] has improved, reports checked A1c at the [place] 3-4 weeks ago --> 8.4% (down from 9.2 % 01/02/20). He would like to target A1c goal of < 7%. He is not checking blood glucose daily but checks about once weekly first thing in the morning and usually 150 or higher, sometimes 200s. Denies hypoglycemia.   We discussed increasing Jardiance to 25 mg. He is almost out of the 10 mg dose so it would be a good time to transition. He is comfortable with this change and plans to begin checking BG 3-4 days per week. If you approve, please send rx to [place].  Thanks,  [name], PharmD Clinical Pharmacist [facility] at [facility] [phone]

## 2020-04-06 NOTE — Telephone Encounter (Signed)
Agree with this. Thank you. 25mg  dose sent to pharmacy.  Lab Results  Component Value Date   CREATININE 1.05 01/02/2020

## 2020-04-06 NOTE — Patient Instructions (Signed)
April 06, 2020  Dear Evan Avery,  It was a pleasure meeting you during our initial appointment on April 06, 2020. Below is a summary of the goals we discussed and components of chronic care management. Please contact me anytime with questions or concerns.   Visit Information  Goals Addressed            This Visit's Progress   . Pharmacy Care Plan       CARE PLAN ENTRY  Current Barriers:  . Chronic Disease Management support, education, and care coordination needs related to Hypertension, Hyperlipidemia, Diabetes, and GERD   Hypertension BP Readings from Last 3 Encounters:  01/08/20 136/64  01/02/20 (!) 161/75  11/10/19 (!) 142/71 .  Pharmacist Clinical Goal(s): o Over the next 30 days, patient will work with PharmD and providers to maintain BP goal <140/90 mmHg . Current regimen:   Atenolol 50 mg - 1 tablet every morning and 1/2 tablet every evening  Lisinopril 5 mg - 1 tablet daily . Interventions: o Recommend checking blood pressure after blood glucose check 3-4 days per week to ensure heart rate and blood pressure are within goal and not contributing to headaches . Patient self care activities - Over the next 30 days, patient will: o Check blood pressure 3-4 days per week, document, and provide at future appointment. Check the same time each day.  o Ensure daily salt intake < 2300 mg/day  Hyperlipidemia Lab Results  Component Value Date/Time   LDLCALC 18 01/02/2020 08:35 AM   LDLCALC 46 03/13/2016 08:03 AM   LDLDIRECT 61.0 12/27/2018 12:45 PM .  Pharmacist Clinical Goal(s): o Over the next 30 days, patient will work with PharmD and providers to maintain LDL goal < 70, TG < 150, and HDL > 40 . Current regimen:  . Atorvastatin 40 mg - 1 tablet daily . Lovaza 1 gm - 2 capsules BID . Ezetimibe 10 mg - 1 tablet daily  . Interventions: o Continue current medications as LDL is at goal and TG improved  . Patient self care activities - Over the next 30 days, patient  will: o Continue medications as prescribed  Diabetes Lab Results  Component Value Date/Time   HGBA1C 9.2 (H) 01/02/2020 08:35 AM   HGBA1C 8.9 (A) 09/03/2019 01:20 PM   HGBA1C 8.4 02/12/2019 12:00 AM   HGBA1C 8.8 (H) 12/27/2018 12:45 PM .  Pharmacist Clinical Goal(s): o Over the next 30 days, patient will work with PharmD and providers to achieve A1c goal <7% . Current regimen:   Jardiance 10 mg - 1 tablet daily  Glimepiride 4 mg - 1 tablet daily  Metformin 1000 mg - 1 tablet twice daily  . Interventions: o Consult with PCP to increase Jardiance dose . Patient self care activities - Over the next 30 days, patient will: o Check blood sugar 3-4 days per week, before breakfast, document, and provide at future appointment with pharmacist o Contact provider with any episodes of hypoglycemia  Acid Reflux . Pharmacist Clinical Goal(s) o Over the next 30 days, patient will work with PharmD and providers to improve acid reflux symptoms  . Current regimen:  o Tums as needed . Interventions: o Reviewed triggers, lifestyle changes, and treatment options . Patient self care activities - Over the next 30 days, patient will: o Try cutting back on coffee intake to see if symptoms improve; Watch for other possible triggers including high fat meals, chocolate, fried foods, peppermint, carbonated beverages, acidic foods.  o Eat small meals,  avoid lying down for 2 hours hours after eating, consider quitting smoking as this can worsen acid reflux  o Try over the counter famotidine 20 mg (Pepcid) twice daily before meals. If ineffective after 2 weeks, consider trial of different medication.   Initial goal documentation      [name] was given information about Chronic Care Management services today including:  1. CCM service includes personalized support from designated clinical staff supervised by his physician, including individualized plan of care and coordination with other care  providers 2. 24/7 contact phone numbers for assistance for urgent and routine care needs. 3. Standard insurance, coinsurance, copays and deductibles apply for chronic care management only during months in which we provide at least 20 minutes of these services. Most insurances cover these services at 100%, however patients may be responsible for any copay, coinsurance and/or deductible if applicable. This service may help you avoid the need for more expensive face-to-face services. 4. Only one practitioner may furnish and bill the service in a calendar month. 5. The patient may stop CCM services at any time (effective at the end of the month) by phone call to the office staff.  Patient agreed to services and verbal consent obtained.   The patient verbalized understanding of instructions provided today and agreed to receive a mailed copy of patient instruction and/or educational materials. Telephone follow up appointment with pharmacy team member scheduled for: 05/06/20 at 1:00 PM (telephone call)  [name], PharmD Clinical Pharmacist [facility] [phone]   Gastroesophageal Reflux Disease, Adult Gastroesophageal reflux (GER) happens when acid from the stomach flows up into the tube that connects the mouth and the stomach (esophagus). Normally, food travels down the esophagus and stays in the stomach to be digested. With GER, food and stomach acid sometimes move back up into the esophagus. You may have a disease called gastroesophageal reflux disease (GERD) if the reflux: Happens often. Causes frequent or very bad symptoms. Causes problems such as damage to the esophagus. When this happens, the esophagus becomes sore and swollen (inflamed). Over time, GERD can make small holes (ulcers) in the lining of the esophagus. What are the causes? This condition is caused by a problem with the muscle between the esophagus and the stomach. When this muscle is weak or not  normal, it does not close properly to keep food and acid from coming back up from the stomach. The muscle can be weak because of: Tobacco use. Pregnancy. Having a certain type of hernia (hiatal hernia). Alcohol use. Certain foods and drinks, such as coffee, chocolate, onions, and peppermint. What increases the risk? You are more likely to develop this condition if you: Are overweight. Have a disease that affects your connective tissue. Use NSAID medicines. What are the signs or symptoms? Symptoms of this condition include: Heartburn. Difficult or painful swallowing. The feeling of having a lump in the throat. A bitter taste in the mouth. Bad breath. Having a lot of saliva. Having an upset or bloated stomach. Belching. Chest pain. Different conditions can cause chest pain. Make sure you see your doctor if you have chest pain. Shortness of breath or noisy breathing (wheezing). Ongoing (chronic) cough or a cough at night. Wearing away of the surface of teeth (tooth enamel). Weight loss. How is this treated? Treatment will depend on how bad your symptoms are. Your doctor may suggest: Changes to your diet. Medicine. Surgery. Follow these instructions at home: Eating and drinking  Follow a diet as  told by your doctor. You may need to avoid foods and drinks such as: Coffee and tea (with or without caffeine). Drinks that contain alcohol. Energy drinks and sports drinks. Bubbly (carbonated) drinks or sodas. Chocolate and cocoa. Peppermint and mint flavorings. Garlic and onions. Horseradish. Spicy and acidic foods. These include peppers, chili powder, curry powder, vinegar, hot sauces, and BBQ sauce. Citrus fruit juices and citrus fruits, such as oranges, lemons, and limes. Tomato-based foods. These include red sauce, chili, salsa, and pizza with red sauce. Fried and fatty foods. These include donuts, french fries, potato chips, and high-fat dressings. High-fat meats. These  include hot dogs, rib eye steak, sausage, ham, and bacon. High-fat dairy items, such as whole milk, butter, and cream cheese. Eat small meals often. Avoid eating large meals. Avoid drinking large amounts of liquid with your meals. Avoid eating meals during the 2-3 hours before bedtime. Avoid lying down right after you eat. Do not exercise right after you eat. Lifestyle  Do not use any products that contain nicotine or tobacco. These include cigarettes, e-cigarettes, and chewing tobacco. If you need help quitting, ask your doctor. Try to lower your stress. If you need help doing this, ask your doctor. If you are overweight, lose an amount of weight that is healthy for you. Ask your doctor about a safe weight loss goal. General instructions Pay attention to any changes in your symptoms. Take over-the-counter and prescription medicines only as told by your doctor. Do not take aspirin, ibuprofen, or other NSAIDs unless your doctor says it is okay. Wear loose clothes. Do not wear anything tight around your waist. Raise (elevate) the head of your bed about 6 inches (15 cm). Avoid bending over if this makes your symptoms worse. Keep all follow-up visits as told by your doctor. This is important. Contact a doctor if: You have new symptoms. You lose weight and you do not know why. You have trouble swallowing or it hurts to swallow. You have wheezing or a cough that keeps happening. Your symptoms do not get better with treatment. You have a hoarse voice. Get help right away if: You have pain in your arms, neck, jaw, teeth, or back. You feel sweaty, dizzy, or light-headed. You have chest pain or shortness of breath. You throw up (vomit) and your throw-up looks like blood or coffee grounds. You pass out (faint). Your poop (stool) is bloody or black. You cannot swallow, drink, or eat. Summary If a person has gastroesophageal reflux disease (GERD), food and stomach acid move back up into the  esophagus and cause symptoms or problems such as damage to the esophagus. Treatment will depend on how bad your symptoms are. Follow a diet as told by your doctor. Take all medicines only as told by your doctor. This information is not intended to replace advice given to you by your health care provider. Make sure you discuss any questions you have with your health care provider. Document Revised: 04/17/2018 Document Reviewed: 04/17/2018 Elsevier Patient Education  [place].

## 2020-04-07 NOTE — Progress Notes (Signed)
I have collaborated with the care management provider regarding care management and care coordination activities outlined in this encounter and have reviewed this encounter including documentation in the note and care plan. I am certifying that I agree with the content of this note and encounter as supervising physician.  

## 2020-04-09 ENCOUNTER — Encounter: Payer: Self-pay | Admitting: Family Medicine

## 2020-04-09 ENCOUNTER — Other Ambulatory Visit: Payer: Self-pay

## 2020-04-09 ENCOUNTER — Ambulatory Visit (INDEPENDENT_AMBULATORY_CARE_PROVIDER_SITE_OTHER): Payer: PPO | Admitting: Family Medicine

## 2020-04-09 VITALS — BP 116/70 | HR 45 | Temp 97.6°F | Ht 68.75 in | Wt 224.5 lb

## 2020-04-09 DIAGNOSIS — E1165 Type 2 diabetes mellitus with hyperglycemia: Secondary | ICD-10-CM

## 2020-04-09 DIAGNOSIS — R519 Headache, unspecified: Secondary | ICD-10-CM | POA: Insufficient documentation

## 2020-04-09 DIAGNOSIS — Z9989 Dependence on other enabling machines and devices: Secondary | ICD-10-CM | POA: Diagnosis not present

## 2020-04-09 DIAGNOSIS — R001 Bradycardia, unspecified: Secondary | ICD-10-CM

## 2020-04-09 DIAGNOSIS — IMO0002 Reserved for concepts with insufficient information to code with codable children: Secondary | ICD-10-CM

## 2020-04-09 DIAGNOSIS — E1121 Type 2 diabetes mellitus with diabetic nephropathy: Secondary | ICD-10-CM

## 2020-04-09 DIAGNOSIS — E785 Hyperlipidemia, unspecified: Secondary | ICD-10-CM | POA: Diagnosis not present

## 2020-04-09 DIAGNOSIS — G4733 Obstructive sleep apnea (adult) (pediatric): Secondary | ICD-10-CM | POA: Diagnosis not present

## 2020-04-09 DIAGNOSIS — I10 Essential (primary) hypertension: Secondary | ICD-10-CM | POA: Diagnosis not present

## 2020-04-09 DIAGNOSIS — E118 Type 2 diabetes mellitus with unspecified complications: Secondary | ICD-10-CM | POA: Diagnosis not present

## 2020-04-09 MED ORDER — METFORMIN HCL 1000 MG PO TABS: 1000.0000 mg | ORAL_TABLET | Freq: Two times a day (BID) | ORAL | 3 refills | Status: DC

## 2020-04-09 NOTE — Progress Notes (Addendum)
This visit was conducted in person.  BP 116/70   Pulse (!) 45   Temp 97.6 F (36.4 C) (Temporal)   Ht 5' 8.75" (1.746 m)   Wt 224 lb 8 oz (101.8 kg)   SpO2 96%   BMI 33.39 kg/m   BP Readings from Last 3 Encounters:  04/09/20 116/70  01/08/20 136/64  01/02/20 (!) 161/75    Pulse Readings from Last 3 Encounters:  04/09/20 (!) 45  01/08/20 (!) 49  11/10/19 (!) 49    CC: DM f/u visit  Subjective:    Patient ID: Evan Avery, male    DOB: 11/25/1945, 74 y.o.   MRN: [id number]  HPI: Evan Avery is a 74 y.o. male presenting on 04/09/2020 for Diabetes (3 month f/u, pt has labs from May from the [place], wants to talk about acid reflux, also having HA since starting pantoprazole 4-5 times daily)   GERD on pantoprazole 40mg  daily since 08/2019 started by cardiology for chest discomfort symptoms - feels this has caused significant intermittent frontal headaches that last several minutes and come on several times a day. Feels related to pantoprazole as they improved when he stopped this 6 wks ago. However he continues having headaches intermittently albeit improving. Sleeping well on CPAP, recent vision screen normal.   Scheduled appt with neurologist through the [place] to further evaluate headaches - not until 06/2020 - he may want an appointment sooner. [facility] (largely improved on pantoprazole).   DM - does not regularly check sugars - yesterday fasting cbg 181 - advised by chronic care management to start checking three times a week. Compliant with antihyperglycemic regimen which includes: jardiance 25mg  daily (started yesterday), amaryl 4mg  daily, metformin 1000mg  twice daily. Denies low sugars or hypoglycemic symptoms. Denies paresthesias. Last diabetic eye exam DUE. Pneumovax (presumed): 2014. Prevnar: 2015. Glucometer brand: thinks accuchek. DSME: declined. Lab Results  Component Value Date   HGBA1C 9.2 (H) 01/02/2020   Diabetic Foot  Exam - Simple   Simple Foot Form Diabetic Foot exam was performed with the following findings: Yes 04/09/2020  8:21 AM  Visual Inspection No deformities, no ulcerations, no other skin breakdown bilaterally: Yes Sensation Testing Intact to touch and monofilament testing bilaterally: Yes Pulse Check See comments: Yes Comments Diminished pulses bilat DP    Lab Results  Component Value Date   MICROALBUR 0.2 12/27/2007         Relevant past medical, surgical, family and social history reviewed and updated as indicated. Interim medical history since our last visit reviewed. Allergies and medications reviewed and updated. Outpatient Medications Prior to Visit  Medication Sig Dispense Refill  . aspirin 81 MG tablet Take 81 mg by mouth daily.      Marland Kitchen atenolol (TENORMIN) 50 MG tablet TAKE 1 TABLET BY MOUTH IN THE MORNING AND 1/2 (ONE-HALF) AT NIGHT 135 tablet 3  . atorvastatin (LIPITOR) 40 MG tablet TAKE 1 TABLET BY MOUTH ONCE DAILY AT  6PM 90 tablet 3  . Brinzolamide-Brimonidine (SIMBRINZA) 1-0.2 % SUSP Apply 1 drop to eye 3 (three) times daily.     . cetirizine (ZYRTEC) 10 MG tablet Take 10 mg by mouth as needed for allergies.    Marland Kitchen clopidogrel (PLAVIX) 75 MG tablet Take 1 tablet by mouth once daily 90 tablet 3  . empagliflozin (JARDIANCE) 25 MG TABS tablet Take 1 tablet (25 mg total) by mouth daily before breakfast. 30 tablet 6  .  glimepiride (AMARYL) 4 MG tablet Take 1 tablet by mouth once daily with breakfast 90 tablet 2  . Glucosamine-Chondroit-Vit C-Mn (GLUCOSAMINE 1500 COMPLEX PO) Take 1,500 mg by mouth daily.    Marland Kitchen ibuprofen (ADVIL,MOTRIN) 200 MG tablet Take 600 mg by mouth every 6 (six) hours as needed for mild pain.    Marland Kitchen latanoprost (XALATAN) 0.005 % ophthalmic solution Place 1 drop into both eyes at bedtime.     Marland Kitchen lisinopril (ZESTRIL) 5 MG tablet Take 1 tablet by mouth once daily 90 tablet 3  . omega-3 acid ethyl esters (LOVAZA) 1 g capsule Take 2 capsules by mouth twice daily 360  capsule 3  . senna-docusate (SENOKOT-S) 8.6-50 MG tablet Take 1 tablet by mouth at bedtime as needed for mild constipation. 15 tablet 0  . metFORMIN (GLUCOPHAGE) 1000 MG tablet TAKE 1 TABLET BY MOUTH TWICE DAILY WITH MEALS 180 tablet 1  . ezetimibe (ZETIA) 10 MG tablet Take 1 tablet (10 mg total) by mouth daily. 90 tablet 3  . famotidine (PEPCID) 20 MG tablet Take 1 tablet (20 mg total) by mouth at bedtime. (Patient not taking: Reported on 04/06/2020)    . pantoprazole (PROTONIX) 40 MG tablet Take 1 tablet (40 mg total) by mouth daily. (Patient not taking: Reported on 04/06/2020) 30 tablet 6   No facility-administered medications prior to visit.     Per HPI unless specifically indicated in ROS section below Review of Systems Objective:  BP 116/70   Pulse (!) 45   Temp 97.6 F (36.4 C) (Temporal)   Ht 5' 8.75" (1.746 m)   Wt 224 lb 8 oz (101.8 kg)   SpO2 96%   BMI 33.39 kg/m   Wt Readings from Last 3 Encounters:  04/09/20 224 lb 8 oz (101.8 kg)  01/08/20 232 lb 4 oz (105.3 kg)  01/02/20 230 lb (104.3 kg)      Physical Exam Vitals and nursing note reviewed.  Constitutional:      General: He is not in acute distress.    Appearance: He is well-developed.  HENT:     Head: Normocephalic and atraumatic.  Eyes:     General: No scleral icterus.    Extraocular Movements: Extraocular movements intact.     Conjunctiva/sclera: Conjunctivae normal.     Pupils: Pupils are equal, round, and reactive to light.  Neck:     Vascular: No carotid bruit.  Cardiovascular:     Rate and Rhythm: Normal rate and regular rhythm.     Heart sounds: Normal heart sounds. No murmur heard.   Pulmonary:     Effort: Pulmonary effort is normal. No respiratory distress.     Breath sounds: Normal breath sounds. No wheezing or rales.  Musculoskeletal:     Cervical back: Normal range of motion and neck supple.     Comments: See HPI for foot exam if done  Lymphadenopathy:     Cervical: No cervical  adenopathy.  Skin:    General: Skin is warm and dry.     Findings: No rash.  Neurological:     General: No focal deficit present.     Motor: Motor function is intact.     Coordination: Coordination is intact.     Gait: Gait is intact.     Comments:  CN 2-12 intact FTN intact EOMI  Psychiatric:        Mood and Affect: Mood normal.        Behavior: Behavior normal.       Results for  orders placed or performed in visit on 01/02/20  CBC with Differential/Platelet  Result Value Ref Range   WBC 7.7 4.0 - 10.5 K/uL   RBC 4.23 4.22 - 5.81 Mil/uL   Hemoglobin 13.6 13.0 - 17.0 g/dL   HCT 39.7 39 - 52 %   MCV 93.8 78.0 - 100.0 fl   MCHC 34.3 30.0 - 36.0 g/dL   RDW 14.4 11.5 - 15.5 %   Platelets 144.0 (L) 150 - 400 K/uL   Neutrophils Relative % 65.2 43 - 77 %   Lymphocytes Relative 20.5 12 - 46 %   Monocytes Relative 7.9 3 - 12 %   Eosinophils Relative 4.7 0 - 5 %   Basophils Relative 1.7 0 - 3 %   Neutro Abs 5.0 1.4 - 7.7 K/uL   Lymphs Abs 1.6 0.7 - 4.0 K/uL   Monocytes Absolute 0.6 0 - 1 K/uL   Eosinophils Absolute 0.4 0 - 0 K/uL   Basophils Absolute 0.1 0 - 0 K/uL  PSA  Result Value Ref Range   PSA 0.52 0.10 - 4.00 ng/mL  Hemoglobin A1c  Result Value Ref Range   Hgb A1c MFr Bld 9.2 (H) 4.6 - 6.5 %  Comprehensive metabolic panel  Result Value Ref Range   Sodium 139 135 - 145 mEq/L   Potassium 4.7 3.5 - 5.1 mEq/L   Chloride 104 96 - 112 mEq/L   CO2 28 19 - 32 mEq/L   Glucose, Bld 238 (H) 70 - 99 mg/dL   BUN 20 6 - 23 mg/dL   Creatinine, Ser 1.05 0.40 - 1.50 mg/dL   Total Bilirubin 0.7 0.2 - 1.2 mg/dL   Alkaline Phosphatase 64 39 - 117 U/L   AST 13 0 - 37 U/L   ALT 17 0 - 53 U/L   Total Protein 6.3 6.0 - 8.3 g/dL   Albumin 4.0 3.5 - 5.2 g/dL   GFR 68.97 >60.00 mL/min   Calcium 9.5 8.4 - 10.5 mg/dL  Lipid panel  Result Value Ref Range   Cholesterol 75 0 - 200 mg/dL   Triglycerides 156.0 (H) 0 - 149 mg/dL   HDL 25.80 (L) >39.00 mg/dL   VLDL 31.2 0.0 - 40.0 mg/dL     LDL Cholesterol 18 0 - 99 mg/dL   Total CHOL/HDL Ratio 3    NonHDL 49.38    A1c through [place] - 8.2% (03/12/2020) Assessment & Plan:  This visit occurred during the SARS-CoV-2 public health emergency.  Safety protocols were in place, including screening questions prior to the visit, additional usage of staff PPE, and extensive cleaning of exam room while observing appropriate contact time as indicated for disinfecting solutions.   Problem List Items Addressed This Visit    Type 2 diabetes mellitus with diabetic nephropathy, without long-term current use of insulin ([place])    [facility] with Umicroalb/cr ratio of 52. He is already on lisinopril 5mg  - consider increased dose.       Relevant Medications   metFORMIN (GLUCOPHAGE) 1000 MG tablet   OSA on CPAP    [facility]. OSA likely not contributing to headache.       Hyperlipidemia with target LDL less than 70    LDL 75 on latest check at the [place] - contines lipitor, lovaza, zetia.       Headache - Primary    New since starting pantoprazole. Actually improving since stopping PPI. For ongoing GERD, I did suggest trial pepcid 20mg   once or twice daily. If breakthrough GERD despite this, consider different PPI. He will let me know if ongoing headache and interested in sooner eval for local neurology referral.  nonfocal neurological exam. Doubt OSA or vision related headache. Not consistent with GCA.       Essential hypertension    Chronic, stable on current regimen.       Diabetes mellitus type 2, uncontrolled, with complications (HCC)    Chronic, improving with latest 1c 8.2% - and just started higher jardiance dose so expect continued improvement. No other changes today. I did encourage checking sugars more regularly.       Relevant Medications   metFORMIN (GLUCOPHAGE) 1000 MG tablet   Bradycardia    Chronic bradycardia on atenolol 50/25mg  daily. Stable.           Meds ordered this encounter   Medications  . metFORMIN (GLUCOPHAGE) 1000 MG tablet    Sig: Take 1 tablet (1,000 mg total) by mouth 2 (two) times daily with a meal.    Dispense:  180 tablet    Refill:  3   No orders of the defined types were placed in this encounter.   Patient Instructions  Try pepcid at night time for reflux symptoms. Let us know how you do with higher jardiance dose.  Agree with stopping pantoprazole. Start pepcid daily and see if headache improves and heartburn well controlled. If not well controlled or ongoing headaches, let me know.    Follow up plan: Return in about 3 months (around 07/10/2020) for follow up visit.  [name], MD

## 2020-04-09 NOTE — Patient Instructions (Addendum)
Try pepcid at night time for reflux symptoms. Let us know how you do with higher jardiance dose.  Agree with stopping pantoprazole. Start pepcid daily and see if headache improves and heartburn well controlled. If not well controlled or ongoing headaches, let me know.

## 2020-04-10 DIAGNOSIS — E1121 Type 2 diabetes mellitus with diabetic nephropathy: Secondary | ICD-10-CM | POA: Insufficient documentation

## 2020-04-10 DIAGNOSIS — R001 Bradycardia, unspecified: Secondary | ICD-10-CM | POA: Insufficient documentation

## 2020-04-10 NOTE — Assessment & Plan Note (Signed)
LDL 75 on latest check at the [place] - contines lipitor, lovaza, zetia.

## 2020-04-10 NOTE — Assessment & Plan Note (Signed)
New since starting pantoprazole. Actually improving since stopping PPI. For ongoing GERD, I did suggest trial pepcid 20mg  once or twice daily. If breakthrough GERD despite this, consider different PPI. He will let me know if ongoing headache and interested in sooner eval for local neurology referral.  nonfocal neurological exam. Doubt OSA or vision related headache. Not consistent with GCA.

## 2020-04-10 NOTE — Assessment & Plan Note (Signed)
Chronic bradycardia on atenolol 50/25mg  daily. Stable.

## 2020-04-10 NOTE — Assessment & Plan Note (Signed)
[facility] with Umicroalb/cr ratio of 52. He is already on lisinopril 5mg  - consider increased dose.

## 2020-04-10 NOTE — Assessment & Plan Note (Signed)
Chronic, stable on current regimen.  

## 2020-04-10 NOTE — Assessment & Plan Note (Addendum)
Compliant with CPAP followed by the [place]. OSA likely not contributing to headache.

## 2020-04-10 NOTE — Assessment & Plan Note (Signed)
Chronic, improving with latest 1c 8.2% - and just started higher jardiance dose so expect continued improvement. No other changes today. I did encourage checking sugars more regularly.

## 2020-04-13 ENCOUNTER — Encounter: Payer: Self-pay | Admitting: Family Medicine

## 2020-05-03 ENCOUNTER — Ambulatory Visit: Payer: PPO | Admitting: Cardiovascular Disease

## 2020-05-03 ENCOUNTER — Other Ambulatory Visit: Payer: Self-pay

## 2020-05-03 ENCOUNTER — Encounter: Payer: Self-pay | Admitting: Cardiovascular Disease

## 2020-05-03 VITALS — BP 125/68 | HR 43 | Temp 95.5°F | Ht 68.0 in | Wt 226.0 lb

## 2020-05-03 DIAGNOSIS — R0789 Other chest pain: Secondary | ICD-10-CM

## 2020-05-03 DIAGNOSIS — E118 Type 2 diabetes mellitus with unspecified complications: Secondary | ICD-10-CM

## 2020-05-03 DIAGNOSIS — E669 Obesity, unspecified: Secondary | ICD-10-CM | POA: Diagnosis not present

## 2020-05-03 DIAGNOSIS — E785 Hyperlipidemia, unspecified: Secondary | ICD-10-CM

## 2020-05-03 DIAGNOSIS — I10 Essential (primary) hypertension: Secondary | ICD-10-CM

## 2020-05-03 DIAGNOSIS — Z9989 Dependence on other enabling machines and devices: Secondary | ICD-10-CM | POA: Diagnosis not present

## 2020-05-03 DIAGNOSIS — I2510 Atherosclerotic heart disease of native coronary artery without angina pectoris: Secondary | ICD-10-CM | POA: Diagnosis not present

## 2020-05-03 DIAGNOSIS — Z720 Tobacco use: Secondary | ICD-10-CM | POA: Diagnosis not present

## 2020-05-03 DIAGNOSIS — G4733 Obstructive sleep apnea (adult) (pediatric): Secondary | ICD-10-CM

## 2020-05-03 DIAGNOSIS — E66811 Obesity, class 1: Secondary | ICD-10-CM

## 2020-05-03 MED ORDER — NITROGLYCERIN 0.4 MG SL SUBL: 0.4000 mg | SUBLINGUAL_TABLET | SUBLINGUAL | 3 refills | Status: DC | PRN

## 2020-05-03 NOTE — Patient Instructions (Signed)
Medication Instructions:  TAKE NITROGLYCERIN AS NEEDED FOR CHEST PAIN  *If you need a refill on your cardiac medications before your next appointment, please call your pharmacy*    Testing/Procedures: Your physician has requested that you have a lexiscan myoview. For further information please visit [email or web address]. Please follow instruction sheet, as given.  [place]   Follow-Up: At [facility], you and your health needs are our priority.  As part of our continuing mission to provide you with exceptional heart care, we have created designated Provider Care Teams.  These Care Teams include your primary Cardiologist (physician) and Advanced Practice Providers (APPs -  Physician Assistants and Nurse Practitioners) who all work together to provide you with the care you need, when you need it.  We recommend signing up for the patient portal called "MyChart".  Sign up information is provided on this After Visit Summary.  MyChart is used to connect with patients for Virtual Visits (Telemedicine).  Patients are able to view lab/test results, encounter notes, upcoming appointments, etc.  Non-urgent messages can be sent to your provider as well.   To learn more about what you can do with MyChart, go to [email or web address].    Your next appointment:  FOLLOW UP POST STRESS TEST- END OF AUGUST, BEGINNING OF SEPTEMBER

## 2020-05-03 NOTE — Progress Notes (Signed)
Patient ID: Evan Avery, male   DOB: 02-06-1946, 74 y.o.   MRN: [id number]   Primary M.D.: Dr. Danise Avery  HPI: Evan Avery is a 74 y.o. male who presents today for a 6 month cardiology evaluation.  Evan Avery has known CAD and has a total of 4 stents in his RCA. In 1992, he underwent initial PTCA of his RCA. Subsequently, he underwent stenting of the proximal and mid RCA.  In 1999 his fourth stent was placed in the mid RCA between the 2 previously placed stents and was a 3.5x28 mm non-DES [place] stent. His last nuclear perfusion study in August 2012 showed inferior thinning without significant ischemia.  EF was not calculated secondary to  ectopy but previously had been 52%.  Additional problems include hypertension, hyperlipidemia, obesity, and obstructive sleep apnea for which he is he uses CPAP 100% of the time and uses  [facility] for his [employer].  Since I last saw him in May 2017.  He denied any recurrent episodes of chest pain or significant dyspnea.  He had a Evan CPAP machine and continue to be compliant.  He established primary care with Dr. Danise Avery.  Review of laboratory that he had with him in December 2016 has shown a cholesterol of 149, HDL 27, but his triglycerides were 413.  Remotely he had triglyceride elevations as high as 584.  In December, his hemoglobin A1c was 7.7.  He underwent surgery in July 2017 due to an abscess in his colon.  He required a colostomy for 6 months and in January 2018 underwent colostomy closure.   I saw him in October 2018 after not having seen him in over 17 months.  Since that evaluation, he has undergone 2 resections for cyst on the back of his neck with recurrent infections.  He recently saw Dr. [name] consideration of surgical excision.  He has felt well with reference to chest pain or shortness of breath.  He had smoked for many years but quit smoking for Avery years but unfortunately resumed smoking in June 2018 and currently smoking  1 pack/day.  He has not had a nuclear perfusion study since 2012.  He continues to use CPAP with 100% compliance.  He was  started on Januvia for his diabetes mellitus by his primary physician.    When Isaw hime in June 2019  he was in need of back and neck surgery.  I recommended he undergo a Evan [facility] study for preoperative assessment.  This was done on April 23, 2018 which showed a small inferior defect, low risk.  EF was 50% with basal inferior hypokinesis.  He tolerated his neck and back surgery well.  Is only he is without anginal symptoms.  He has a documented umbilical/ventral  hernia and has been without abdominal discomfort.  He continues to use CPAP with 100% compliance.  He received a Evan CPAP machine at the [facility] is a [facility].  He gets his supplies from the [facility].    I saw him in February 2020 and last saw him in January 2021.  He denied any chest pain  with walking but does admit to some mild shortness of breath.  Unfortunately he still smokes 1 pack of cigarettes per day.  He had experienced some throat burning which had improved with pantoprazole.  He has an umbilical hernia and ventral hernia but denies any pain.  He continues to be on aspirin and Plavix for DAPT.  He is  on atorvastatin 86m,  Zetia Avery mg and Lovaza 2 capsules twice daily for mixed hyperlipidemia.  He continues to be on lisinopril 5 mg and atenolol 50 mg in the morning and 25 mg in the evening for hypertension.  There are no palpitations.  He is diabetic on glimepiride and Metformin.  He was bradycardic but was entirely asymptomatic.  Since I last saw him, he has experienced several episodes of chest heaviness.  This at times would last a minute.  At times he is taking Tums with benefit.  Unfortunately he still smokes cigarettes.  He has been drinking an excessive amount of coffee typically at least 6 cups/day.  There is some dyspepsia and he recently started taking Pepcid with some improvement.  He  apparently is need to undergo endoscopy to be done at the [facility]  He presents for evaluation.  Past Medical History:  Diagnosis Date  . Agent orange exposure 1968  . Arthritis   . CAD (coronary artery disease)    4 stents in right artery  . Complex tear of medial meniscus of left knee as current injury Avery/01/2012   [facility]  . Diabetes mellitus    type 2  . Diverticulitis of colon with perforation 05/23/2016  . Glaucoma 2006   on drops, followed by [facility] . Hyperlipidemia   . Hypertension   . Intra-abdominal abscess ([facility] 05/16/2016  . LUMBAR RADICULOPATHY 07/18/2007   Qualifier: Diagnosis of  By: Evan Avery, Evan Avery  . Myocardial infarction ([facility]    1992  . Sleep apnea    uses CPAP regularly    Past Surgical History:  Procedure Laterality Date  . APPENDECTOMY  1958  . BOWEL RESECTION N/A 05/23/2016   Procedure: SMALL BOWEL RESECTION;  Surgeon: TJackolyn Confer MD;  Location: [facility];  Service: General;  Laterality: N/A;  . CARDIAC CATHETERIZATION  [facility]very/20/2009   angiographically patent LAD diagonal, circumflex and multiple circumflex obtuse marginal branches from the L coronary artery; stenting of mid R coronary artery with 3.5 x 218mLiberte non DES postdilated w/ 4.0 DuraStent (Evan [facility]very . CARDIOVASCULAR STRESS TEST  06/16/2011   R/P MV - moderate perfusion defect in basal inferoseptal, basal inferior, mid inferseptal and mid inferior regions, consistent with infarct/scar and/or diaphragmatic attenuation; non-gated study secondary to ectopy; no CP or EKG changes for ischemia; abnormal study although no significant changes from previous study; in the absense of gated images cannot calculate EF or distinguish scar/artifact  . COLONOSCOPY  08/2016   TAx5, sessile serrated polyp, diverticulosis, rpt 2 yrs ([facility])  . COLOSTOMY Left 05/23/2016   Procedure: COLOSTOMY;  Surgeon: Evan Avery;  [facility]: [facility];  Service: General;  Laterality: Left;  . COLOSTOMY TAKEDOWN N/A 11/10/2016   Procedure:  LAPAROSCOPIC LYSIS OF ADHESIONS AND  COLOSTOMY CLOSURE;  Surgeon: Evan Avery;  [facility]: [facility];  Service: General;  Laterality: N/A;  . CYST EXCISION  04/2018   posterior neck ([facility])  . DOPPLER ECHOCARDIOGRAPHY  Avery/29/2009   EF >5>80%LV systolic function normal; LV mildly dilated; doppler flow suggestive of impaired LV relaxation; RV mildly dilated, RV systolic function normal, RV systolic pressure normal  . INCISION AND DRAINAGE ABSCESS N/A 05/23/2016   Procedure: DRAINAGE OF MULTIPLE INTRA  ABDOMINAL ABSCESS;  Surgeon: Evan Avery;  Location: [facility];  Service: General;  Laterality: N/A;  . KNEE SURGERY Left 2015   torn meniscus ([facility] . LAPAROSCOPIC LYSIS OF ADHESIONS  11/10/2016   Procedure: LAPAROSCOPIC LYSIS OF  ADHESIONS;  Surgeon: Evan Confer, MD;  [facility]: [facility];  Service: General;;  . LAPAROTOMY N/A 05/23/2016   Procedure: EXPLORATORY LAPAROTOMY;  Surgeon: Evan Confer, MD;  [facility]: [facility];  Service: General;  Laterality: N/A;  . MASS EXCISION N/A 05/22/2018   Procedure: EXCISION OF CYST ON POSTERIOR NECK ERAS PATHWAY;  Surgeon: Evan Luna, MD;  Location: [facility];  Service: General;  Laterality: N/A;  . PERCUTANEOUS CORONARY STENT INTERVENTION (PCI-S)  1992, 1999, 2004   4 vessels [facility])  . PROCTOSCOPY  11/10/2016   Procedure: PROCTOSCOPY;  Surgeon: Evan Confer, MD;  Location: [facility];  Service: General;;    Allergies  Allergen Reactions  . Dilaudid [Hydromorphone Hcl] Itching  . Ambien [Zolpidem] Other (See Comments)    Pt states "makes me wild"  . Niacin And Related Other (See Comments)    Extreme fatigue    Current Outpatient Medications  Medication Sig Dispense Refill  . aspirin 81 MG tablet Take 81 mg by mouth daily.      Marland Kitchen atenolol (TENORMIN) 50 MG tablet TAKE 1 TABLET BY MOUTH IN THE MORNING AND 1/2 (ONE-HALF) AT NIGHT 135 tablet 3  . atorvastatin (LIPITOR) 40 MG tablet TAKE 1 TABLET BY MOUTH ONCE DAILY AT   6PM 90 tablet 3  . Brinzolamide-Brimonidine (SIMBRINZA) 1-0.2 % SUSP Apply 1 drop to eye 3 (three) times daily.     . cetirizine (ZYRTEC) Avery MG tablet Take Avery mg by mouth as needed for allergies.    Marland Kitchen clopidogrel (PLAVIX) 75 MG tablet Take 1 tablet by mouth once daily 90 tablet 3  . empagliflozin (JARDIANCE) 25 MG TABS tablet Take 1 tablet (25 mg total) by mouth daily before breakfast. 30 tablet 6  . famotidine (PEPCID) 20 MG tablet Take 1 tablet (20 mg total) by mouth at bedtime.    Marland Kitchen glimepiride (AMARYL) 4 MG tablet Take 1 tablet by mouth once daily with breakfast 90 tablet 2  . Glucosamine-Chondroit-Vit C-Mn (GLUCOSAMINE 1500 COMPLEX PO) Take 1,500 mg by mouth daily.    Marland Kitchen ibuprofen (ADVIL,MOTRIN) 200 MG tablet Take 600 mg by mouth every 6 (six) hours as needed for mild pain.    Marland Kitchen latanoprost (XALATAN) 0.005 % ophthalmic solution Place 1 drop into both eyes at bedtime.     Marland Kitchen lisinopril (ZESTRIL) 5 MG tablet Take 1 tablet by mouth once daily 90 tablet 3  . metFORMIN (GLUCOPHAGE) 1000 MG tablet Take 1 tablet (1,000 mg total) by mouth 2 (two) times daily with a meal. 180 tablet 3  . omega-3 acid ethyl esters (LOVAZA) 1 g capsule Take 2 capsules by mouth twice daily 360 capsule 3  . senna-docusate (SENOKOT-S) 8.6-50 MG tablet Take 1 tablet by mouth at bedtime as needed for mild constipation. 15 tablet 0  . ezetimibe (ZETIA) Avery MG tablet Take 1 tablet (Avery mg total) by mouth daily. 90 tablet 3  . nitroGLYCERIN (NITROSTAT) 0.4 MG SL tablet Place 1 tablet (0.4 mg total) under the tongue every 5 (five) minutes as needed for chest pain. 30 tablet 3   No current facility-administered medications for this visit.    Socially he is married has 2 children 2 grandchildren. He does walk several times a week. He does have a tobacco history. There is no alcohol use.   ROS General: Negative; No fevers, chills, or night sweats; positive for obesity. HEENT: Negative; No changes in vision or hearing, sinus  congestion, difficulty swallowing Pulmonary: Negative; No cough, wheezing, shortness of breath, hemoptysis  Cardiovascular: Negative; No chest pain, presyncope, syncope, palpatations GI: Colonic abscess requiring colostomy, and ultimate closure; positive for dyspepsia GU: Negative; No dysuria, hematuria, or difficulty voiding Musculoskeletal: He is status post left knee surgery; no myalgias, joint pain, or weakness Hematologic/Oncology: Negative; no easy bruising, bleeding Endocrine: Negative; no heat/cold intolerance; no diabetes Neuro: Negative; no changes in balance, headaches Skin: Recurrent cysts in the back of his neck Psychiatric: Negative; No behavioral problems, depression Sleep: Positive for sleep apnea, on CPAP with 100% compliance. No snoring, daytime sleepiness, hypersomnolence, bruxism, restless legs, hypnogognic hallucinations, no cataplexy Other comprehensive 14 point system review is negative.   PE BP 125/68   Pulse (!) 43   Temp (!) 95.5 F (35.3 C)   Ht _0  (1.727 m)   Wt 226 lb (102.5 kg)   SpO2 91%   BMI 34.36 kg/m    Repeat blood pressure by me was 114/70  Wt Readings from Last 3 Encounters:  05/03/20 226 lb (102.5 kg)  04/09/20 224 lb 8 oz (101.8 kg)  01/08/20 232 lb 4 oz (105.3 kg)   General: Alert, oriented, no distress.  Skin: normal turgor, no rashes, warm and dry HEENT: Normocephalic, atraumatic. Pupils equal round and reactive to light; sclera anicteric; extraocular muscles intact;  Nose without nasal septal hypertrophy Mouth/Parynx benign; Mallinpatti scale 3 Neck: No JVD, no carotid bruits; normal carotid upstroke Lungs: clear to ausculatation and percussion; no wheezing or rales Chest wall: without tenderness to palpitation Heart: PMI not displaced, RRR, s1 s2 normal, 1/6 systolic murmur, no diastolic murmur, no rubs, gallops, thrills, or heaves Abdomen: Umbilical and ventral hernias, soft, nontender; no hepatosplenomehaly, BS+; abdominal  aorta nontender and not dilated by palpation. Back: no CVA tenderness Pulses 2+ Musculoskeletal: full range of motion, normal strength, no joint deformities Extremities: no clubbing cyanosis or edema, Homan's sign negative  Neurologic: grossly nonfocal; Cranial nerves grossly wnl Psychologic: Normal mood and affect   ECG (independently read by me): Sinus bradycardia at 43 bpm.  No significant ST-T changes.  Normal intervals.  No ectopy  November 10, 2019 ECG (independently read by me):Sinus bradycardia at 46 bpm.  Nonspecific T changes.  Normal intervals.  No ectopy  February 2020 ECG (independently read by me): Sinus Bradycardioa 51; Norml intervals  June 2018 ECG (independently read by me): Sinus bradycardia 57 bpm.  No ectopy.  Normal intervals.  October 2018 ECG (independently read by me): Normal sinus rhythm with PAC.  PR interval 168 ms.  QTc interval 454 ms.  May 2017 ECG (independently read by me): Sinus bradycardia 52 bpm.  T-wave abnormality in leads 3 and aVF.  May 2016 ECG (independently read by me): Sinus bradycardia 53 bpm.  Nondiagnostic T changes with T-wave inversion in lead 3.  June 2015 ECG (independently read by me):  sinus bradycardia at 55 QTc interval 447 ms.  PR interval 174 ms.  Nondiagnostic T changes in lead 3.  ECG: Sinus rhythm at 56 beats per minute. No ectopy. Normal intervals; nonspecific T changes in lead 3.  LABS: BMP Latest Ref Rng & Units 03/12/2020 01/02/2020 02/18/2019  Glucose 70 - 99 mg/dL - 238(H) -  BUN 6 - 23 mg/dL - 20 -  Creatinine 0.6 - 1.3 1.2 1.05 1.1  Sodium 137 - 147 139 139 -  Potassium 3.4 - 5.3 4.9 4.7 4.7  Chloride 96 - 112 mEq/L - 104 -  CO2 19 - 32 mEq/L - 28 -  Calcium 8.4 - Avery.5 mg/dL - 9.5 -  Hepatic Function Latest Ref Rng & Units 03/12/2020 01/02/2020 02/18/2019  Total Protein 6.0 - 8.3 g/dL - 6.3 -  Albumin 3.5 - 5.2 g/dL - 4.0 -  AST 14 - 40 _0 ALT Avery - 40 35 17 30  Alk Phosphatase 25 - 125 63 64 71  Total  Bilirubin 0.2 - 1.2 mg/dL - 0.7 -  Bilirubin, Direct 0.01 - 0.4 0.1 - -    CBC Latest Ref Rng & Units 03/12/2020 01/02/2020 02/18/2019  WBC - 8.2 7.7 7.2  Hemoglobin 13.5 - 17.5 14.0 13.6 13.2(A)  Hematocrit 39 - 52 % - 39.7 -  Platelets 150 - 399 133(A) 144.0(L) 147(A)   Lab Results  Component Value Date   TSH 0.90 03/12/2020   Lab Results  Component Value Date   HGBA1C 8.2 03/12/2020   Lipid Panel     Component Value Date/Time   CHOL 78 03/12/2020 0000   CHOL 149 03/13/2016 0803   TRIG 208 (A) 03/12/2020 0000   TRIG 387 (H) 03/13/2016 0803   HDL 30 (A) 03/12/2020 0000   HDL 26 (L) 03/13/2016 0803   CHOLHDL 3 01/02/2020 0835   VLDL 31.2 01/02/2020 0835   LDLCALC 75 03/12/2020 0000   LDLCALC 46 03/13/2016 0803   LDLDIRECT 61.0 12/27/2018 1245    RADIOLOGY: No results found.  IMPRESSION:  1. CAD in native artery   2. Chest tightness   3. Essential hypertension   4. Hyperlipidemia with target LDL less than 70   5. OSA on CPAP   6. Type 2 diabetes mellitus with complication, without long-term current use of insulin (HCC)   7. Tobacco abuse   8. Obesity, Class I, BMI 30.0-34.9 (see actual BMI)      ASSESSMENT AND PLAN: Mr. Bridgewater  is a 74 year old Caucasian male who has CAD and underwent initial PTCA to his RCA in 1992.  His last catheterization was in October 2009 when his fourth stent was placed in his RCA.   He continues to be on dual antiplatelet therapy with aspirin and Plavix with his multiple stents.  A preoperative Lexiscan Myoview study in July 2019 prior to his back surgery was low risk and showed a small inferior defect with EF 50%.  Recently, he has had issues with development of chest pressure and chest heaviness.  He often would take Tums for this.  Symptoms would typically last a minute or 2.  He recently has started to take Pepcid and has noted some improvement.  However, he continues to smoke cigarettes.  He has been drinking excessive amounts of caffeine  and at minimum drinks 6 cups of coffee per day.  His ECG shows sinus bradycardia and he is asymptomatic with reference to presyncope lightheadedness or dizziness.  Blood pressure today is stable on atenolol 50 mg in the morning and 25 mg at night and lisinopril 5 mg.  He is diabetic on Jardiance, Metformin, and glimepiride.  On Mar 12, 2020 LDL cholesterol was 75.  He is on Zetia Avery mg in addition to omega-3 fatty acids.  He will be undergoing an endoscopy at the [facility].  With his longstanding history of CAD, and recent episodes of chest pressure and heaviness I am recommending he undergo a 2-year follow-up [place] study for further evaluation of potential ischemia.  I will give him a prescription for renewal of sublingual nitroglycerin.  He continues to use CPAP.Marland Kitchen  We discussed the importance of smoking cessation and weight loss.  I  will see him in follow-up of his nuclear study in August for further evaluation.  Evan Sine, MD, [facility]  05/09/2020 Avery:47 PM

## 2020-05-04 ENCOUNTER — Telehealth: Payer: Self-pay | Admitting: Cardiovascular Disease

## 2020-05-04 NOTE — Telephone Encounter (Signed)
Left message for patient to call and schedule Lexiscan myoview ordered by Dr. Kelly 

## 2020-05-06 ENCOUNTER — Other Ambulatory Visit: Payer: Self-pay

## 2020-05-06 ENCOUNTER — Telehealth (HOSPITAL_COMMUNITY): Payer: Self-pay

## 2020-05-06 ENCOUNTER — Ambulatory Visit: Payer: PPO

## 2020-05-06 DIAGNOSIS — K219 Gastro-esophageal reflux disease without esophagitis: Secondary | ICD-10-CM

## 2020-05-06 DIAGNOSIS — I10 Essential (primary) hypertension: Secondary | ICD-10-CM

## 2020-05-06 DIAGNOSIS — E1121 Type 2 diabetes mellitus with diabetic nephropathy: Secondary | ICD-10-CM

## 2020-05-06 NOTE — Chronic Care Management (AMB) (Signed)
Chronic Care Management Pharmacy  Name: Evan Avery  MRN: [id number] DOB: 05-19-1946  Chief Complaint/ HPI  Evan Avery,  74 y.o., male presents for their Follow-Up CCM visit with the clinical pharmacist via telephone.  PCP : Evan Bush, MD  Their chronic conditions include: hypertension, CAD, OSA on CPAP, COPD, fatty liver, GERD, type 2 diabetes, hyperlipidemia, tobacco use, glaucoma  Last CCM 04/06/20 - increased Jardiance to 25 mg daily  Patient concerns:  Recently started taking Pepcid BID PRN, reports symptoms have improved, taking at least 3-4 days/week. Reports GI doctor recommended stress test to rule out cardiac cause of chest pain. Denies problems with increased dose of Jardiance.   Office Visits:  04/09/20: PCP visit - GERD, recommend trial pepcid 20 mg once or twice daily   01/08/20: PCP visit - DM compliant on metformin and glimepiride, cost concerns with starting new treatment, GLP1 unaffordable does not check sugars regularly, last CBG 190, price out Jardiance; acid reflux worsening, added pepcid 20 mg at last visit, verify pt is taking both then consider BID PPI, glaucoma followed by [facility]  09/03/19: PCP visit - DM, both Januvia and Ozempic unaffordable, GERD, started on pantoprazole 40 mg daily per cardio due to chest pain last month with benefit, also started on Zetia per cardio, split atenolol 75 mg daily to 50 mg AM and 25 mg PM to see if bradycardia improves, add Pepcid for GERD breakthrough symptoms  Consult Visit:  05/03/20: Cardiology - stress test   11/10/19: Cardiology - CAD 11 month follow up, continues on DAPT due to multiple stents, BP controlled on atenolol and lisinopril, asymptomatic bradycardia, recommend close home monitoring, denies angina, CPAP through [place], 100% compliant, continue to smoke cigarettes  Allergies  Allergen Reactions  . Dilaudid [Hydromorphone Hcl] Itching  . Ambien [Zolpidem] Other (See Comments)    Pt states "makes me  wild"  . Niacin And Related Other (See Comments)    Extreme fatigue   Medications: Outpatient Encounter Medications as of 05/06/2020  Medication Sig  . aspirin 81 MG tablet Take 81 mg by mouth daily.    Marland Kitchen atenolol (TENORMIN) 50 MG tablet TAKE 1 TABLET BY MOUTH IN THE MORNING AND 1/2 (ONE-HALF) AT NIGHT  . atorvastatin (LIPITOR) 40 MG tablet TAKE 1 TABLET BY MOUTH ONCE DAILY AT  6PM  . Brinzolamide-Brimonidine (SIMBRINZA) 1-0.2 % SUSP Apply 1 drop to eye 3 (three) times daily.   . cetirizine (ZYRTEC) 10 MG tablet Take 10 mg by mouth as needed for allergies.  Marland Kitchen clopidogrel (PLAVIX) 75 MG tablet Take 1 tablet by mouth once daily  . empagliflozin (JARDIANCE) 25 MG TABS tablet Take 1 tablet (25 mg total) by mouth daily before breakfast.  . ezetimibe (ZETIA) 10 MG tablet Take 1 tablet (10 mg total) by mouth daily.  . famotidine (PEPCID) 20 MG tablet Take 1 tablet (20 mg total) by mouth at bedtime.  Marland Kitchen glimepiride (AMARYL) 4 MG tablet Take 1 tablet by mouth once daily with breakfast  . Glucosamine-Chondroit-Vit C-Mn (GLUCOSAMINE 1500 COMPLEX PO) Take 1,500 mg by mouth daily.  Marland Kitchen ibuprofen (ADVIL,MOTRIN) 200 MG tablet Take 600 mg by mouth every 6 (six) hours as needed for mild pain.  Marland Kitchen latanoprost (XALATAN) 0.005 % ophthalmic solution Place 1 drop into both eyes at bedtime.   Marland Kitchen lisinopril (ZESTRIL) 5 MG tablet Take 1 tablet by mouth once daily  . metFORMIN (GLUCOPHAGE) 1000 MG tablet Take 1 tablet (1,000 mg total) by mouth 2 (two) times daily  with a meal.  . nitroGLYCERIN (NITROSTAT) 0.4 MG SL tablet Place 1 tablet (0.4 mg total) under the tongue every 5 (five) minutes as needed for chest pain.  Marland Kitchen omega-3 acid ethyl esters (LOVAZA) 1 g capsule Take 2 capsules by mouth twice daily  . senna-docusate (SENOKOT-S) 8.6-50 MG tablet Take 1 tablet by mouth at bedtime as needed for mild constipation.   No facility-administered encounter medications on file as of 05/06/2020.   Current Diagnosis/Assessment:     Emergency planning/management officer Strain: Medium Risk  . Difficulty of Paying Living Expenses: Somewhat hard   Goals    . DIET - INCREASE WATER INTAKE     Starting 12/27/2018, I will continue to drink at least 6-8 glasses of water daily.     . Patient Stated     01/02/2020, I will maintain and continue medications as prescribed.     . Pharmacy Care Plan     CARE PLAN ENTRY  Current Barriers:  . Chronic Disease Management support, education, and care coordination needs related to Hypertension, Diabetes, and GERD   Hypertension BP Readings from Last 3 Encounters:  05/03/20 125/68  04/09/20 116/70  01/08/20 136/64 .  Pharmacist Clinical Goal(s): o Over the next 3 months, patient will work with PharmD and providers to maintain BP goal <140/90 mmHg . Current regimen:   Atenolol 50 mg - 1 tablet every morning and 1/2 tablet every evening  Lisinopril 5 mg - 1 tablet daily . Interventions: o Comprehensive medication review; clinic readings now well within goal . Patient self care activities - Over the next 3 months, patient will: o Continue medications as prescribed o Ensure daily salt intake < 2300 mg/day  Diabetes Lab Results  Component Value Date/Time   HGBA1C 8.2 03/12/2020 12:00 AM   HGBA1C 9.2 (H) 01/02/2020 08:35 AM   HGBA1C 8.9 (A) 09/03/2019 01:20 PM   HGBA1C 8.4 02/12/2019 12:00 AM .  Home blood glucose monitoring: Fasting range 140-180s (checking 2 days/week) . Pharmacist Clinical Goal(s): o Over the next 3 months, patient will work with PharmD and providers to achieve A1c goal <7%, fasting blood glucose 80-130, and 2 hours after meals < 180 . Current regimen:   Jardiance 25 mg - 1 tablet daily  Glimepiride 4 mg - 1 tablet daily  Metformin 1000 mg - 1 tablet twice daily  . Interventions: o Reviewed home glucose log. Fasting readings have improved but remain above goal of 80-130 mg/dL o Reviewed additional treatment options, including Actos, Ozempic, Januvia. Cost concerns with  Ozempic and Januvia. Recommend making dietary changes and re-evaluating A1c in 3 months.  . Patient self care activities - Over the next 3 months, patient will: o Monitor blood sugar 3-4 days per week, before breakfast, document, and provide at future appointment with pharmacist o Avoid missed doses of medications and take them at the same time each day o Consider keeping a food diary and checking blood glucose 2 hours after meals o Review carbohydrate counting and the diabetes plate method o Contact provider with any episodes of hypoglycemia  Acid Reflux . Pharmacist Clinical Goal(s) o Over the next 3 months, patient will work with PharmD and providers to improve acid reflux symptoms  . Current regimen:  o Pepcid 20 mg - 1 tablet twice daily as needed o Tums - as needed . Interventions: o Reviewed triggers, lifestyle changes, and treatment options . Patient self care activities - Over the next 3 months, patient will: o Try cutting back on coffee intake  to see if symptoms improve.  o Watch for other possible triggers including high fat meals, chocolate, fried foods, peppermint, carbonated beverages, acidic foods. Quitting smoking can also improve acid reflux symptoms  o Eat small meals and avoid lying down for 2 hours hours after eating  Vaccine Recommendations: Recommend 2-dose series of shingles vaccine from local pharmacy  Please see past updates related to this goal by clicking on the "Past Updates" button in the selected goal       Hypertension   CMP Latest Ref Rng & Units 03/12/2020 01/02/2020 02/18/2019  Glucose 70 - 99 mg/dL - 238(H) -  BUN 6 - 23 mg/dL - 20 -  Creatinine 0.6 - 1.3 1.2 1.05 1.1  Sodium 137 - 147 139 139 -  Potassium 3.4 - 5.3 4.9 4.7 4.7  Chloride 96 - 112 mEq/L - 104 -  CO2 19 - 32 mEq/L - 28 -  Calcium 8.4 - 10.5 mg/dL - 9.5 -  Total Protein 6.0 - 8.3 g/dL - 6.3 -  Total Bilirubin 0.2 - 1.2 mg/dL - 0.7 -  Alkaline Phos 25 - 125 63 64 71  AST 14 - 40 17  13 15   ALT 10 - 40 35 17 30   Office blood pressures are: BP Readings from Last 3 Encounters:  05/03/20 125/68  04/09/20 116/70  01/08/20 136/64   Patient has failed these meds in the past: none reported Patient checks BP at home infrequently, has not checked BP since last visit  Patient home BP readings are ranging: none reported  BP goal < 140/90 mmHg Patient is currently controlled on the following medications:   Atenolol 50 mg - 1 tablet every morning and 1/2 tablet every evening  Lisinopril 5 mg - 1 tablet daily  We discussed: blood pressure in clinic is well controlled  History of bradycardia on atenolol, dose reduced and split to BID to reduce bradycardia  Plan: Continue current medications  COPD / Tobacco   Last spirometry score: none, diagnosed per imaging  Eosinophil count:   Lab Results  Component Value Date/Time   EOSPCT 4.7 01/02/2020 08:35 AM  %                               Eos (Absolute):  Lab Results  Component Value Date/Time   EOSABS 0.4 01/02/2020 08:35 AM   Tobacco Status:  Social History   Tobacco Use  Smoking Status Current Every Day Smoker  . Packs/day: 1.00  . Years: 50.00  . Pack years: 50.00  . Types: Cigarettes  . Start date: 10/23/1958  Smokeless Tobacco Never Used   Patient has failed these meds in past: none reported Patient is currently controlled on the following medications:   No pharmacotherapy  We discussed: denies SOB, uses Zyrtec PRN Tobacco use: current 1 PPD, recently received Nicotine patches from [place] and has set quit date of 7/23/2, working on quitting with his wife and son   Plan: Continue current medications; Plan for quit date next week.   Diabetes   Recent Relevant Labs: Lab Results  Component Value Date/Time   HGBA1C 8.2 03/12/2020 12:00 AM   HGBA1C 9.2 (H) 01/02/2020 08:35 AM   HGBA1C 8.9 (A) 09/03/2019 01:20 PM   HGBA1C 8.4 02/12/2019 12:00 AM   MICROALBUR 0.2 12/27/2007 10:00 AM    Checking BG: a  couple days/week Recent FBG Readings: 140-182 (improving) Denies hypoglycemia  A1c goal < 7% Patient  has failed these meds in past: Ozempic (denies trying, reports cost concern). Januvia (cost) Patient is currently uncontrolled on the following medications:   Jardiance 25 mg - 1 tablet daily  Glimepiride 4 mg - 1 tablet daily  Metformin 1000 mg - 1 tablet BID   Last diabetic eye exam: completed at [facility] May 2021 per patient report  Last diabetic foot exam: completed by PCP 03/2020   We discussed: Reports doing well on increased dose of Jardiance. Fasting BG has improved from 150-200s to 140-180s. Still further room for improvement. Cost limitations with several options. May consider Actos if unable to control with current therapy and dietary changes.  Adherence: confirms adherence, denies cost concerns, > 5 day gap between refills on metformin   Exercise: none due to bad joints, unable to walk, able to do some yardwork, dig weeds Diet: reports weight is down from 254 lbs this time last year, current weight is 226 lbs  Plan: Continue current medications; Avoid missed doses. Recommend working on dietary changes and re-evaluating A1c in 3 months.   GERD   Followed by gastroenterologist  Patient has failed these meds in past: pantoprazole (headaches) Patient is currently controlled on the following medications:   Famotidine 20 mg - 1 tablet twice daily PRN  Assessment:  . Adherence:stopped pantoprazole 3 months ago due to headaches, taking famotidine 3-4 days per week and has noticed improvement in symptoms, headache also improved . Symptoms: much improved, usually chest pain/burning sensation . Triggers: spicy foods, reviewed other common triggers and pt reports drinking coffee daily (5-6 16 oz cups/day), recommended trial of reducing coffee intake at last visit  . OTCs: currently drinking aloe vera juice from pharmacy, has not noticed much improvement; using Tums several days per week    Plan: Continue current medications  Vaccines   Reviewed and discussed patient's vaccination history.    Immunization History  Administered Date(s) Administered  . Fluad Quad(high Dose 65+) 07/29/2019  . Influenza Split 08/02/2012  . Influenza,inj,Quad PF,6+ Mos 09/17/2013, 09/07/2014, 09/06/2016, 11/07/2017  . Influenza-Unspecified 09/28/2015, 07/23/2018  . PFIZER SARS-COV-2 Vaccination 12/15/2019, 01/05/2020  . Pneumococcal Conjugate-13 10/23/2013  . Pneumococcal-Unspecified 10/23/2012  . Td 10/23/2000  . Tdap 10/23/2012   Plan: Recommended patient receive Shingrix   Medication Management  Pharmacy/Benefits: [facility]  Adherence: refills timely, no concerns   Affordability: denies concerns  CCM Follow Up:  3 months, telephone   [name], PharmD Clinical Pharmacist [facility] at [facility] [phone]

## 2020-05-06 NOTE — Telephone Encounter (Signed)
Encounter complete. 

## 2020-05-06 NOTE — Patient Instructions (Addendum)
Dear Evan Avery,  Below is a summary of the goals we discussed during our follow up appointment on May 06, 2020. Please contact me anytime with questions or concerns.   Visit Information  Goals    . DIET - INCREASE WATER INTAKE     Starting 12/27/2018, I will continue to drink at least 6-8 glasses of water daily.     . Patient Stated     01/02/2020, I will maintain and continue medications as prescribed.     . Pharmacy Care Plan     CARE PLAN ENTRY  Current Barriers:  . Chronic Disease Management support, education, and care coordination needs related to Hypertension, Diabetes, and GERD   Hypertension BP Readings from Last 3 Encounters:  05/03/20 125/68  04/09/20 116/70  01/08/20 136/64 .  Pharmacist Clinical Goal(s): o Over the next 3 months, patient will work with PharmD and providers to maintain BP goal <140/90 mmHg . Current regimen:   Atenolol 50 mg - 1 tablet every morning and 1/2 tablet every evening  Lisinopril 5 mg - 1 tablet daily . Interventions: o Comprehensive medication review; clinic readings now well within goal . Patient self care activities - Over the next 3 months, patient will: o Continue medications as prescribed o Ensure daily salt intake < 2300 mg/day  Diabetes Lab Results  Component Value Date/Time   HGBA1C 8.2 03/12/2020 12:00 AM   HGBA1C 9.2 (H) 01/02/2020 08:35 AM   HGBA1C 8.9 (A) 09/03/2019 01:20 PM   HGBA1C 8.4 02/12/2019 12:00 AM .  Home blood glucose monitoring: Fasting range 140-180s (checking 2 days/week) . Pharmacist Clinical Goal(s): o Over the next 3 months, patient will work with PharmD and providers to achieve A1c goal <7%, fasting blood glucose 80-130, and 2 hours after meals < 180 . Current regimen:   Jardiance 25 mg - 1 tablet daily  Glimepiride 4 mg - 1 tablet daily  Metformin 1000 mg - 1 tablet twice daily  . Interventions: o Reviewed home glucose log. Fasting readings have improved but remain above goal of 80-130  mg/dL o Reviewed additional treatment options, including Actos, Ozempic, Januvia. Cost concerns with Ozempic and Januvia. Recommend making dietary changes and re-evaluating A1c in 3 months.  . Patient self care activities - Over the next 3 months, patient will: o Monitor blood sugar 3-4 days per week, before breakfast, document, and provide at future appointment with pharmacist o Avoid missed doses of medications and take them at the same time each day o Consider keeping a food diary and checking blood glucose 2 hours after meals o Review carbohydrate counting and the diabetes plate method o Contact provider with any episodes of hypoglycemia  Acid Reflux . Pharmacist Clinical Goal(s) o Over the next 3 months, patient will work with PharmD and providers to improve acid reflux symptoms  . Current regimen:  o Pepcid 20 mg - 1 tablet twice daily as needed o Tums - as needed . Interventions: o Reviewed triggers, lifestyle changes, and treatment options . Patient self care activities - Over the next 3 months, patient will: o Try cutting back on coffee intake to see if symptoms improve.  o Watch for other possible triggers including high fat meals, chocolate, fried foods, peppermint, carbonated beverages, acidic foods. Quitting smoking can also improve acid reflux symptoms  o Eat small meals and avoid lying down for 2 hours hours after eating  Vaccine Recommendations: Recommend 2-dose series of shingles vaccine from local pharmacy  Please see past updates  related to this goal by clicking on the "Past Updates" button in the selected goal       The patient verbalized understanding of instructions provided today and agreed to receive a mailed copy of patient instruction and/or educational materials.  Telephone follow up appointment with pharmacy team member scheduled for: September 20, 2020 at 1 PM (telephone visit)  [name], PharmD Clinical Pharmacist [facility]  Carbohydrate Counting for Diabetes Mellitus, Adult  Carbohydrate counting is a method of keeping track of how many carbohydrates you eat. Eating carbohydrates naturally increases the amount of sugar (glucose) in the blood. Counting how many carbohydrates you eat helps keep your blood glucose within normal limits, which helps you manage your diabetes (diabetes mellitus). It is important to know how many carbohydrates you can safely have in each meal. This is different for every person. A diet and nutrition specialist (registered dietitian) can help you make a meal plan and calculate how many carbohydrates you should have at each meal and snack. Carbohydrates are found in the following foods:  Grains, such as breads and cereals.  Dried beans and soy products.  Starchy vegetables, such as potatoes, peas, and corn.  Fruit and fruit juices.  Milk and yogurt.  Sweets and snack foods, such as cake, cookies, candy, chips, and soft drinks. How do I count carbohydrates? There are two ways to count carbohydrates in food. You can use either of the methods or a combination of both. Reading "Nutrition Facts" on packaged food The "Nutrition Facts" list is included on the labels of almost all packaged foods and beverages in the [place] It includes:  The serving size.  Information about nutrients in each serving, including the grams (g) of carbohydrate per serving. To use the "Nutrition Facts":  Decide how many servings you will have.  Multiply the number of servings by the number of carbohydrates per serving.  The resulting number is the total amount of carbohydrates that you will be having. Learning standard serving sizes of other foods When you eat carbohydrate foods that are not packaged or do not include "Nutrition Facts" on the label, you need to measure the servings in order to count the amount of carbohydrates:  Measure the foods that you will eat with a food scale or  measuring cup, if needed.  Decide how many standard-size servings you will eat.  Multiply the number of servings by 15. Most carbohydrate-rich foods have about 15 g of carbohydrates per serving. ? For example, if you eat 8 oz (170 g) of strawberries, you will have eaten 2 servings and 30 g of carbohydrates (2 servings x 15 g = 30 g).  For foods that have more than one food mixed, such as soups and casseroles, you must count the carbohydrates in each food that is included. The following list contains standard serving sizes of common carbohydrate-rich foods. Each of these servings has about 15 g of carbohydrates:   hamburger bun or  English muffin.   oz (15 mL) syrup.   oz (14 g) jelly.  1 slice of bread.  1 six-inch tortilla.  3 oz (85 g) cooked rice or pasta.  4 oz (113 g) cooked dried beans.  4 oz (113 g) starchy vegetable, such as peas, corn, or potatoes.  4 oz (113 g) hot cereal.  4 oz (113 g) mashed potatoes or  of a large baked potato.  4 oz (113 g) canned or frozen fruit.  4 oz (120  mL) fruit juice.  4-6 crackers.  6 chicken nuggets.  6 oz (170 g) unsweetened dry cereal.  6 oz (170 g) plain fat-free yogurt or yogurt sweetened with artificial sweeteners.  8 oz (240 mL) milk.  8 oz (170 g) fresh fruit or one small piece of fruit.  24 oz (680 g) popped popcorn. Example of carbohydrate counting Sample meal  3 oz (85 g) chicken breast.  6 oz (170 g) brown rice.  4 oz (113 g) corn.  8 oz (240 mL) milk.  8 oz (170 g) strawberries with sugar-free whipped topping. Carbohydrate calculation 1. Identify the foods that contain carbohydrates: ? Rice. ? Corn. ? Milk. ? Strawberries. 2. Calculate how many servings you have of each food: ? 2 servings rice. ? 1 serving corn. ? 1 serving milk. ? 1 serving strawberries. 3. Multiply each number of servings by 15 g: ? 2 servings rice x 15 g = 30 g. ? 1 serving corn x 15 g = 15 g. ? 1 serving milk x 15 g =  15 g. ? 1 serving strawberries x 15 g = 15 g. 4. Add together all of the amounts to find the total grams of carbohydrates eaten: ? 30 g + 15 g + 15 g + 15 g = 75 g of carbohydrates total. Summary  Carbohydrate counting is a method of keeping track of how many carbohydrates you eat.  Eating carbohydrates naturally increases the amount of sugar (glucose) in the blood.  Counting how many carbohydrates you eat helps keep your blood glucose within normal limits, which helps you manage your diabetes.  A diet and nutrition specialist (registered dietitian) can help you make a meal plan and calculate how many carbohydrates you should have at each meal and snack. This information is not intended to replace advice given to you by your health care provider. Make sure you discuss any questions you have with your health care provider. Document Revised: 05/03/2017 Document Reviewed: 03/22/2016 Elsevier Patient Education  [place].

## 2020-05-09 ENCOUNTER — Encounter: Payer: Self-pay | Admitting: Cardiovascular Disease

## 2020-05-11 ENCOUNTER — Other Ambulatory Visit: Payer: Self-pay

## 2020-05-11 ENCOUNTER — Ambulatory Visit (HOSPITAL_COMMUNITY)
Admission: RE | Admit: 2020-05-11 | Discharge: 2020-05-11 | Disposition: A | Discharge status: Home / Self Care | Payer: PPO | Source: Ambulatory Visit | Attending: Cardiovascular Disease | Admitting: Cardiovascular Disease

## 2020-05-11 DIAGNOSIS — G4733 Obstructive sleep apnea (adult) (pediatric): Secondary | ICD-10-CM

## 2020-05-11 DIAGNOSIS — E118 Type 2 diabetes mellitus with unspecified complications: Secondary | ICD-10-CM | POA: Diagnosis not present

## 2020-05-11 DIAGNOSIS — Z720 Tobacco use: Secondary | ICD-10-CM | POA: Diagnosis not present

## 2020-05-11 DIAGNOSIS — I2510 Atherosclerotic heart disease of native coronary artery without angina pectoris: Secondary | ICD-10-CM | POA: Diagnosis not present

## 2020-05-11 DIAGNOSIS — E785 Hyperlipidemia, unspecified: Secondary | ICD-10-CM

## 2020-05-11 DIAGNOSIS — Z9989 Dependence on other enabling machines and devices: Secondary | ICD-10-CM | POA: Diagnosis not present

## 2020-05-11 LAB — MYOCARDIAL PERFUSION IMAGING
LV dias vol: 191 mL (ref 62–150)
LV sys vol: 109 mL
Peak HR: 63 {beats}/min
Rest HR: 39 {beats}/min
SDS: 3
SRS: 0
SSS: 3
TID: 1.15

## 2020-05-11 MED ORDER — REGADENOSON 0.4 MG/5ML IV SOLN
0.4000 mg | Freq: Once | INTRAVENOUS | Status: AC
  Administered 2020-05-11: 0.4 mg via INTRAVENOUS

## 2020-05-11 MED ORDER — AMINOPHYLLINE 25 MG/ML IV SOLN
75.0000 mg | Freq: Once | INTRAVENOUS | Status: AC
  Administered 2020-05-11: 75 mg via INTRAVENOUS

## 2020-05-11 MED ORDER — TECHNETIUM TC 99M TETROFOSMIN IV KIT
32.0000 | PACK | Freq: Once | INTRAVENOUS | Status: AC | PRN
  Administered 2020-05-11: 32 via INTRAVENOUS
  Filled 2020-05-11: qty 32

## 2020-05-11 MED ORDER — TECHNETIUM TC 99M TETROFOSMIN IV KIT
10.9000 | PACK | Freq: Once | INTRAVENOUS | Status: AC | PRN
  Administered 2020-05-11: 10.9 via INTRAVENOUS
  Filled 2020-05-11: qty 11

## 2020-05-23 NOTE — Progress Notes (Signed)
I have collaborated with the care management provider regarding care management and care coordination activities outlined in this encounter and have reviewed this encounter including documentation in the note and care plan. I am certifying that I agree with the content of this note and encounter as supervising physician.  

## 2020-06-01 ENCOUNTER — Telehealth: Payer: Self-pay | Admitting: *Deleted

## 2020-06-01 NOTE — Telephone Encounter (Signed)
(  06/01/2020) Pt notified that lung cancer screening imaging is due currently or in the near future. Pt stated he did not want to get imaging done this year and will not be making an appt  SRW

## 2020-06-21 ENCOUNTER — Ambulatory Visit: Payer: PPO | Admitting: Cardiovascular Disease

## 2020-06-21 ENCOUNTER — Encounter: Payer: Self-pay | Admitting: Cardiovascular Disease

## 2020-06-21 ENCOUNTER — Other Ambulatory Visit: Payer: Self-pay

## 2020-06-21 VITALS — BP 140/60 | HR 43 | Ht 68.0 in | Wt 233.0 lb

## 2020-06-21 DIAGNOSIS — E118 Type 2 diabetes mellitus with unspecified complications: Secondary | ICD-10-CM

## 2020-06-21 DIAGNOSIS — R1013 Epigastric pain: Secondary | ICD-10-CM | POA: Diagnosis not present

## 2020-06-21 DIAGNOSIS — Z720 Tobacco use: Secondary | ICD-10-CM

## 2020-06-21 DIAGNOSIS — I2510 Atherosclerotic heart disease of native coronary artery without angina pectoris: Secondary | ICD-10-CM | POA: Diagnosis not present

## 2020-06-21 DIAGNOSIS — Z9989 Dependence on other enabling machines and devices: Secondary | ICD-10-CM | POA: Diagnosis not present

## 2020-06-21 DIAGNOSIS — R001 Bradycardia, unspecified: Secondary | ICD-10-CM | POA: Diagnosis not present

## 2020-06-21 DIAGNOSIS — G4733 Obstructive sleep apnea (adult) (pediatric): Secondary | ICD-10-CM

## 2020-06-21 DIAGNOSIS — E785 Hyperlipidemia, unspecified: Secondary | ICD-10-CM

## 2020-06-21 MED ORDER — ATENOLOL 25 MG PO TABS: 25.0000 mg | ORAL_TABLET | Freq: Two times a day (BID) | ORAL | 3 refills | Status: DC

## 2020-06-21 NOTE — Patient Instructions (Signed)
Medication Instructions:  DECREASE YOUR ATENOLOL TO 25MG  TWICE A DAY  TRY YOUR PANTOPRAZOLE  *If you need a refill on your cardiac medications before your next appointment, please call your pharmacy*     Follow-Up: At [facility], you and your health needs are our priority.  As part of our continuing mission to provide you with exceptional heart care, we have created designated Provider Care Teams.  These Care Teams include your primary Cardiologist (physician) and Advanced Practice Providers (APPs -  Physician Assistants and Nurse Practitioners) who all work together to provide you with the care you need, when you need it.  We recommend signing up for the patient portal called "MyChart".  Sign up information is provided on this After Visit Summary.  MyChart is used to connect with patients for Virtual Visits (Telemedicine).  Patients are able to view lab/test results, encounter notes, upcoming appointments, etc.  Non-urgent messages can be sent to your provider as well.   To learn more about what you can do with MyChart, go to [email or web address].    Your next appointment:   6 month(s)  The format for your next appointment:   In Person  Provider:   Shelva Majestic, MD

## 2020-06-21 NOTE — Progress Notes (Signed)
Patient ID: BRECKEN DEWOODY, male   DOB: [birth year], 74 y.o.   MRN: [id number]   Primary M.D.: Dr. Danise Mina  HPI: SAJAD GLANDER is a 74 y.o. male who presents today for a 6 week cardiology evaluation.  Mr. Derrig has known CAD and has a total of 4 stents in his RCA. In 1992, he underwent initial PTCA of his RCA. Subsequently, he underwent stenting of the proximal and mid RCA.  In 1999 his fourth stent was placed in the mid RCA between the 2 previously placed stents and was a 3.5x28 mm non-DES [place] stent. His last nuclear perfusion study in August 2012 showed inferior thinning without significant ischemia.  EF was not calculated secondary to  ectopy but previously had been 52%.  Additional problems include hypertension, hyperlipidemia, obesity, and obstructive sleep apnea for which he is he uses CPAP 100% of the time and uses  [employer] for his [employer].  Since I last saw him in May 2017.  He denied any recurrent episodes of chest pain or significant dyspnea.  He had a new CPAP machine and continue to be compliant.  He established primary care with Dr. Danise Mina.  Review of laboratory that he had with him in December 2016 has shown a cholesterol of 149, HDL 27, but his triglycerides were 413.  Remotely he had triglyceride elevations as high as 584.  In December, his hemoglobin A1c was 7.7.  He underwent surgery in July 2017 due to an abscess in his colon.  He required a colostomy for 6 months and in January 2018 underwent colostomy closure.   I saw him in October 2018 after not having seen him in over 17 months.  Since that evaluation, he has undergone 2 resections for cyst on the back of his neck with recurrent infections.  He recently saw Dr. Brantley Stage consideration of surgical excision.  He has felt well with reference to chest pain or shortness of breath.  He had smoked for many years but quit smoking for 10 years but unfortunately resumed smoking in June 2018 and currently smoking  1 pack/day.  He has not had a nuclear perfusion study since 2012.  He continues to use CPAP with 100% compliance.  He was  started on Januvia for his diabetes mellitus by his primary physician.    When I saw hime in June 2019  he was in need of back and neck surgery.  I recommended he undergo a [place] study for preoperative assessment.  This was done on April 23, 2018 which showed a small inferior defect, low risk.  EF was 50% with basal inferior hypokinesis.  He tolerated his neck and back surgery well.  Is only he is without anginal symptoms.  He has a documented umbilical/ventral  hernia and has been without abdominal discomfort.  He continues to use CPAP with 100% compliance.  He received a new CPAP machine at the [facility] is a ResMed AirSense 10.  He gets his supplies from the [place].    I saw him in February 2020 and  saw him in January 2021.  He denied any chest pain  with walking but does admit to some mild shortness of breath.  Unfortunately he still smokes 1 pack of cigarettes per day.  He had experienced some throat burning which had improved with pantoprazole.  He has an umbilical hernia and ventral hernia but denies any pain.  He continues to be on aspirin and Plavix for DAPT.  He  is on atorvastatin 53m,  Zetia 10 mg and Lovaza 2 capsules twice daily for mixed hyperlipidemia.  He continues to be on lisinopril 5 mg and atenolol 50 mg in the morning and 25 mg in the evening for hypertension.  There are no palpitations.  He is diabetic on glimepiride and Metformin.  He was bradycardic but was entirely asymptomatic.  I last saw him in July 2021 at that time he stated that he had experienced  several episodes of chest heaviness.  His episodes were short-lived times he would take Tums with benefit.  Unfortunately he still smokes cigarettes.  He has been drinking an excessive amount of coffee typically at least 6 cups/day.  There was some dyspepsia and he recently started taking Pepcid with some  improvement.  He apparently is need [place] [facility] [place] during [place], I again discussed the importance of smoking cessation.  With his longstanding history of CAD, and recent chest discomfort I recommended he undergo 2-year follow-up Lexiscan Myoview for further evaluation.    He underwent a LUgandaMyoview study on May 11, 2020.  There was evidence for small size mild fixed mid inferior perfusion defect most likely scar.  There was no associated ischemia.  EF was 43% with basal to mid inferior hypokinesis.  There was no change from the prior study in 2019.  Since I last saw him, he has felt improved.  He denies recurrent chest pain or exertional symptoms.  He is trying to significantly reduce his tobacco which previously had been 1 to 2 packs/day and most recently is 1 pack in a week.  He presents for follow-up evaluation.  Past Medical History:  Diagnosis Date  . Agent orange exposure 1968  . Arthritis   . CAD (coronary artery disease)    4 stents in right artery  . Complex tear of medial meniscus of left knee as current injury 07/26/2012   [place]  . Diabetes mellitus    type 2  . Diverticulitis of colon with perforation 05/23/2016  . Glaucoma 2006   on drops, [place] [facility] . Hyperlipidemia   . Hypertension   . [place] 05/16[place]  . LUMBAR RADICULOPATHY 07/18/2007   Qualifier: Diagnosis of  By: [name], [name]  . Myocardial infarction ([facility]    1992  . Sleep apnea    uses CPAP regularly    Past Surgical History:  Procedure Laterality Date  . APPENDECTOMY  1958  . BOWEL RESECTION N/A 05/23/2016   Procedure: SMALL BOWEL RESECTION;  Surgeon: TJackolyn Confer MD;  Location: [facility];  Service: General;  Laterality: N/A;  . CARDIAC CATHETERIZATION  08/11/2008   angiographically patent LAD diagonal, circumflex and multiple circumflex obtuse marginal branches from the L coronary artery; stenting of mid R coronary artery with 3.5 x  260mLiberte non DES postdilated w/ 4.0 DuraStent (KClaiborne Billings . CARDIOVASCULAR STRESS TEST  06/16/2011   R/P MV - moderate perfusion defect in basal inferoseptal, basal inferior, mid inferseptal and mid inferior regions, consistent with infarct/scar and/or diaphragmatic attenuation; non-gated study secondary to ectopy; no CP or EKG changes for ischemia; abnormal study although no significant changes from previous study; in the absense of gated images cannot calculate EF or distinguish scar/artifact  . COLONOSCOPY  08/2016   TAx5, sessile serrated polyp, diverticulosis, rpt 2 yrs ([place])  . COLOSTOMY Left 05/23/2016   Procedure: COLOSTOMY;  Surgeon: ToJackolyn ConferMD;  Location: [facility];  Service: General;  Laterality: Left;  . COLOSTOMY TAKEDOWN N/A 11/10/2016   Procedure: LAPAROSCOPIC LYSIS OF ADHESIONS AND  COLOSTOMY CLOSURE;  Surgeon: Jackolyn Confer, MD;  Location: [facility];  Service: General;  Laterality: N/A;  . CYST EXCISION  04/2018   posterior neck ([place])  . DOPPLER ECHOCARDIOGRAPHY  08/20/2008   EF >45%; LV systolic function normal; LV mildly dilated; doppler flow suggestive of impaired LV relaxation; RV mildly dilated, RV systolic function normal, RV systolic pressure normal  . INCISION AND DRAINAGE ABSCESS N/A 05/23/2016   Procedure: DRAINAGE OF MULTIPLE INTRA  ABDOMINAL ABSCESS;  Surgeon: Jackolyn Confer, MD;  Location: [facility];  Service: General;  Laterality: N/A;  . KNEE SURGERY Left 2015   torn meniscus Raliegh Ip)  . LAPAROSCOPIC LYSIS OF ADHESIONS  11/10/2016   Procedure: LAPAROSCOPIC LYSIS OF ADHESIONS;  Surgeon: Jackolyn Confer, MD;  Location: [facility];  Service: General;;  . LAPAROTOMY N/A 05/23/2016   Procedure: EXPLORATORY LAPAROTOMY;  Surgeon: Jackolyn Confer, MD;  [facility];  Service: General;  Laterality: N/A;  . MASS EXCISION N/A 05/22/2018   Procedure: EXCISION OF CYST ON POSTERIOR NECK ERAS PATHWAY;  Surgeon: Erroll Luna, MD;  Location: [place];   Service: General;  Laterality: N/A;  . PERCUTANEOUS CORONARY STENT INTERVENTION (PCI-S)  1992, 1999, 2004   4 vessels [name])  . PROCTOSCOPY  11/10/2016   Procedure: PROCTOSCOPY;  Surgeon: Jackolyn Confer, MD;  [facility];  Service: General;;    Allergies  Allergen Reactions  . Dilaudid [Hydromorphone Hcl] Itching  . Ambien [Zolpidem] Other (See Comments)    Pt states "makes me wild"  . Niacin And Related Other (See Comments)    Extreme fatigue    Current Outpatient Medications  Medication Sig Dispense Refill  . aspirin 81 MG tablet Take 81 mg by mouth daily.      Marland Kitchen atenolol (TENORMIN) 25 MG tablet Take 1 tablet (25 mg total) by mouth 2 (two) times daily. 180 tablet 3  . atorvastatin (LIPITOR) 40 MG tablet TAKE 1 TABLET BY MOUTH ONCE DAILY AT  6PM 90 tablet 3  . Brinzolamide-Brimonidine (SIMBRINZA) 1-0.2 % SUSP Apply 1 drop to eye 3 (three) times daily.     . cetirizine (ZYRTEC) 10 MG tablet Take 10 mg by mouth as needed for allergies.    Marland Kitchen clopidogrel (PLAVIX) 75 MG tablet Take 1 tablet by mouth once daily 90 tablet 3  . empagliflozin (JARDIANCE) 25 MG TABS tablet Take 1 tablet (25 mg total) by mouth daily before breakfast. 30 tablet 6  . famotidine (PEPCID) 20 MG tablet Take 1 tablet (20 mg total) by mouth at bedtime.    Marland Kitchen glimepiride (AMARYL) 4 MG tablet Take 1 tablet by mouth once daily with breakfast 90 tablet 2  . Glucosamine-Chondroit-Vit C-Mn (GLUCOSAMINE 1500 COMPLEX PO) Take 1,500 mg by mouth daily.    Marland Kitchen ibuprofen (ADVIL,MOTRIN) 200 MG tablet Take 600 mg by mouth every 6 (six) hours as needed for mild pain.    Marland Kitchen latanoprost (XALATAN) 0.005 % ophthalmic solution Place 1 drop into both eyes at bedtime.     Marland Kitchen lisinopril (ZESTRIL) 5 MG tablet Take 1 tablet by mouth once daily 90 tablet 3  . metFORMIN (GLUCOPHAGE) 1000 MG tablet Take 1 tablet (1,000 mg total) by mouth 2 (two) times daily with a meal. 180 tablet 3  . nitroGLYCERIN (NITROSTAT) 0.4 MG SL tablet Place 1 tablet  (0.4 mg total) under the tongue every 5 (five) minutes as needed for chest pain.  30 tablet 3  . omega-3 acid ethyl esters (LOVAZA) 1 g capsule Take 2 capsules by mouth twice daily 360 capsule 3  . senna-docusate (SENOKOT-S) 8.6-50 MG tablet Take 1 tablet by mouth at bedtime as needed for mild constipation. 15 tablet 0  . ezetimibe (ZETIA) 10 MG tablet Take 1 tablet (10 mg total) by mouth daily. 90 tablet 3   No current facility-administered medications for this visit.    Socially he is married has 2 children 2 grandchildren. He does walk several times a week. He does have a tobacco history. There is no alcohol use.   ROS General: Negative; No fevers, chills, or night sweats; positive for obesity. HEENT: Negative; No changes in vision or hearing, sinus congestion, difficulty swallowing Pulmonary: Negative; No cough, wheezing, shortness of breath, hemoptysis Cardiovascular: Negative; No chest pain, presyncope, syncope, palpatations GI: Colonic abscess requiring colostomy, and ultimate closure; positive for dyspepsia GU: Negative; No dysuria, hematuria, or difficulty voiding Musculoskeletal: He is status post left knee surgery; no myalgias, joint pain, or weakness Hematologic/Oncology: Negative; no easy bruising, bleeding Endocrine: Negative; no heat/cold intolerance; no diabetes Neuro: Negative; no changes in balance, headaches Skin: Recurrent cysts in the back of his neck Psychiatric: Negative; No behavioral problems, depression Sleep: Positive for sleep apnea, on CPAP with 100% compliance. No snoring, daytime sleepiness, hypersomnolence, bruxism, restless legs, hypnogognic hallucinations, no cataplexy Other comprehensive 14 point system review is negative.   PE BP 140/60   Pulse (!) 43   Ht 5' 8" (1.727 m)   Wt 233 lb (105.7 kg)   SpO2 96%   BMI 35.43 kg/m    Repeat blood pressure by me was 136/62  Wt Readings from Last 3 Encounters:  06/21/20 233 lb (105.7 kg)  05/11/20 226  lb (102.5 kg)  05/03/20 226 lb (102.5 kg)   General: Alert, oriented, no distress.  Skin: normal turgor, no rashes, warm and dry HEENT: Normocephalic, atraumatic. Pupils equal round and reactive to light; sclera anicteric; extraocular muscles intact;  Nose without nasal septal hypertrophy Mouth/Parynx benign; Mallinpatti scale 3Neck: No JVD, no carotid bruits; normal carotid upstroke Lungs: clear to ausculatation and percussion; no wheezing or rales Chest wall: without tenderness to palpitation Heart: PMI not displaced, RRR, s1 s2 normal, 1/6 systolic murmur, no diastolic murmur, no rubs, gallops, thrills, or heaves Abdomen: Umbilical and ventral hernias; soft, nontender; no hepatosplenomehaly, BS+; abdominal aorta nontender and not dilated by palpation. Back: no CVA tenderness Pulses 2+ Musculoskeletal: full range of motion, normal strength, no joint deformities Extremities: no clubbing cyanosis or edema, Homan's sign negative  Neurologic: grossly nonfocal; Cranial nerves grossly wnl Psychologic: Normal mood and affect   ECG (independently read by me): Sinus Bradycardia at 43; no ectopy; normal intervals  July 2021ECG (independently read by me): Sinus bradycardia at 43 bpm.  No significant ST-T changes.  Normal intervals.  No ectopy  November 10, 2019 ECG (independently read by me):Sinus bradycardia at 46 bpm.  Nonspecific T changes.  Normal intervals.  No ectopy  February 2020 ECG (independently read by me): Sinus Bradycardioa 51; Norml intervals  June 2018 ECG (independently read by me): Sinus bradycardia 57 bpm.  No ectopy.  Normal intervals.  October 2018 ECG (independently read by me): Normal sinus rhythm with PAC.  PR interval 168 ms.  QTc interval 454 ms.  May 2017 ECG (independently read by me): Sinus bradycardia 52 bpm.  T-wave abnormality in leads 3 and aVF.  May 2016 ECG (independently read by me): Sinus bradycardia  53 bpm.  Nondiagnostic T changes with T-wave inversion  in lead 3.  June 2015 ECG (independently read by me):  sinus bradycardia at 55 QTc interval 447 ms.  PR interval 174 ms.  Nondiagnostic T changes in lead 3.  ECG: Sinus rhythm at 56 beats per minute. No ectopy. Normal intervals; nonspecific T changes in lead 3.  LABS: BMP Latest Ref Rng & Units 03/12/2020 01/02/2020 02/18/2019  Glucose 70 - 99 mg/dL - 238(H) -  BUN 6 - 23 mg/dL - 20 -  Creatinine 0.6 - 1.3 1.2 1.05 1.1  Sodium 137 - 147 139 139 -  Potassium 3.4 - 5.3 4.9 4.7 4.7  Chloride 96 - 112 mEq/L - 104 -  CO2 19 - 32 mEq/L - 28 -  Calcium 8.4 - 10.5 mg/dL - 9.5 -   Hepatic Function Latest Ref Rng & Units 03/12/2020 01/02/2020 02/18/2019  Total Protein 6.0 - 8.3 g/dL - 6.3 -  Albumin 3.5 - 5.2 g/dL - 4.0 -  AST 14 - 40 _0 ALT 10 - 40 35 17 30  Alk Phosphatase 25 - 125 63 64 71  Total Bilirubin 0.2 - 1.2 mg/dL - 0.7 -  Bilirubin, Direct 0.01 - 0.4 0.1 - -    CBC Latest Ref Rng & Units 03/12/2020 01/02/2020 02/18/2019  WBC - 8.2 7.7 7.2  Hemoglobin 13.5 - 17.5 14.0 13.6 13.2(A)  Hematocrit 39 - 52 % - 39.7 -  Platelets 150 - 399 133(A) 144.0(L) 147(A)   Lab Results  Component Value Date   TSH 0.90 03/12/2020   Lab Results  Component Value Date   HGBA1C 8.2 03/12/2020   Lipid Panel     Component Value Date/Time   CHOL 78 03/12/2020 0000   CHOL 149 03/13/2016 0803   TRIG 208 (A) 03/12/2020 0000   TRIG 387 (H) 03/13/2016 0803   HDL 30 (A) 03/12/2020 0000   HDL 26 (L) 03/13/2016 0803   CHOLHDL 3 01/02/2020 0835   VLDL 31.2 01/02/2020 0835   LDLCALC 75 03/12/2020 0000   LDLCALC 46 03/13/2016 0803   LDLDIRECT 61.0 12/27/2018 1245    RADIOLOGY: No results found.  [place] [facility] Myoview 05/11/2020 Study Highlights    The left ventricular ejection fraction is moderately decreased (30-44%).  Nuclear stress EF: 43%.  No T wave inversion was noted during stress.  There was no ST segment deviation noted during stress.  Defect 1: There is a small defect of mild  severity present in the mid inferior location.  Findings consistent with prior myocardial infarction.  This is an intermediate risk study.   Small size, mild severity fixed mid inferior perfusion defect, likely scar. LVEF 43% with basal to mid inferior hypokinesis. This is an intermediate risk study. Compared to a prior study in 2019, the LVEF is calculated lower, but appears visually higher - the perfusion defect appears unchanged.    IMPRESSION:  1. CAD in native artery   2. Hyperlipidemia with target LDL less than 70   3. Dyspepsia   4. OSA on CPAP   5. Type 2 diabetes mellitus with complication, without long-term current use of insulin ([place])   6. Tobacco abuse   7. Bradycardia      ASSESSMENT AND PLAN: Mr. Ellithorpe  is a 74 year old Caucasian male who has CAD and underwent initial PTCA to his RCA in 1992.  His last catheterization was in October 2009 when his fourth stent was placed in his RCA.   He continues  to be on dual antiplatelet therapy with aspirin and Plavix with his multiple stents.  A preoperative Lexiscan Myoview study in July 2019 prior to his back surgery was low risk and showed a small inferior defect with EF 50%.  When I saw him in July 2021 he had developed nonexertional episodes of chest pressure which were short-lived and often improved with antacid therapy. Symptoms would typically last a minute or 2.  He recently has started to take Pepcid and has noted some improvement.  At that time he was drinking over 6 cups of coffee per day which undoubtedly may have contributed to dyspeptic symptomatology.  His blood pressure was stable.  I reviewed his most recent [place] study with him.  This study is essentially unchanged from his 2019 evaluation although calculated EF was slightly low at 43%.  Presently he is on atenolol 50 mg in the morning and 25 mg in the evening, lisinopril 5 mg daily.  Repeat blood pressure by me was 136/62.  He has continued to be on DAPT with  aspirin/Plavix.  He is on atorvastatin 40 mg and Zetia 10 mg for hyperlipidemia.  LDL cholesterol in May 2021 was 75.  At that time triglycerides were increased to 208.  He has made progress with attempting to wean his tobacco use.  EKG today shows marked sinus bradycardia with heart rate 3.  For this reason I recommended he use his atenolol to 25 mg twice a day.  If blood pressure increases further titration of lisinopril to 10 mg may be necessary.  He continues to use CPAP with 1% compliance which is followed for his low VA.  I have recommended a trial of pantoprazole for his dyspepsia which should provide improved benefit compared to Pepcid.  He will have a follow-up visit at the [place] in several weeks.  I will see him in 6 months for cardiology reevaluation.   Troy Sine, MD, [facility]  06/23/2020 2:05 PM

## 2020-06-23 ENCOUNTER — Encounter: Payer: Self-pay | Admitting: Cardiovascular Disease

## 2020-07-12 ENCOUNTER — Ambulatory Visit (INDEPENDENT_AMBULATORY_CARE_PROVIDER_SITE_OTHER): Payer: PPO | Admitting: Family Medicine

## 2020-07-12 ENCOUNTER — Encounter: Payer: Self-pay | Admitting: Family Medicine

## 2020-07-12 ENCOUNTER — Other Ambulatory Visit: Payer: Self-pay

## 2020-07-12 VITALS — BP 126/60 | HR 50 | Temp 97.9°F | Ht 68.0 in | Wt 229.4 lb

## 2020-07-12 DIAGNOSIS — I2510 Atherosclerotic heart disease of native coronary artery without angina pectoris: Secondary | ICD-10-CM | POA: Diagnosis not present

## 2020-07-12 DIAGNOSIS — E1165 Type 2 diabetes mellitus with hyperglycemia: Secondary | ICD-10-CM

## 2020-07-12 DIAGNOSIS — F17200 Nicotine dependence, unspecified, uncomplicated: Secondary | ICD-10-CM | POA: Diagnosis not present

## 2020-07-12 DIAGNOSIS — E118 Type 2 diabetes mellitus with unspecified complications: Secondary | ICD-10-CM

## 2020-07-12 DIAGNOSIS — K219 Gastro-esophageal reflux disease without esophagitis: Secondary | ICD-10-CM

## 2020-07-12 DIAGNOSIS — Z1211 Encounter for screening for malignant neoplasm of colon: Secondary | ICD-10-CM | POA: Diagnosis not present

## 2020-07-12 DIAGNOSIS — IMO0002 Reserved for concepts with insufficient information to code with codable children: Secondary | ICD-10-CM

## 2020-07-12 LAB — POCT GLYCOSYLATED HEMOGLOBIN (HGB A1C): Hemoglobin A1C: 7.4 % — AB (ref 4.0–5.6)

## 2020-07-12 MED ORDER — DEXLANSOPRAZOLE 60 MG PO CPDR: 60.0000 mg | DELAYED_RELEASE_CAPSULE | Freq: Every day | ORAL | 1 refills | Status: DC

## 2020-07-12 NOTE — Assessment & Plan Note (Signed)
Chronic, continues improving but ran out of jardiance and now unaffordable. I asked him to price out other SGLT2s with insurance, check with [facility] about [facility] them, and let me know what he finds out. Continue amaryl and metformin at this time.  Eye exam regularly through [place] - I asked him to bring me next eye exam report.

## 2020-07-12 NOTE — Progress Notes (Signed)
This visit was conducted in person.  BP 126/60 (BP Location: Left Arm, Patient Position: Sitting, Cuff Size: Large)   Pulse (!) 50   Temp 97.9 F (36.6 C) (Temporal)   Ht 5\' 8"  (1.727 m)   Wt 229 lb 6 oz (104 kg)   SpO2 96%   BMI 34.88 kg/m    CC: 3 mo f/u visit  Subjective:    Patient ID: Evan Avery, male    DOB: [birth year], 74 y.o.   MRN: [id number]  HPI: Evan Avery is a 74 y.o. male presenting on 07/12/2020 for Follow-up (Here for 3 mo f/u.)   Smoking - quit using nicotine patches 6 wks ago, restarted 2 wks ago due to increased stress - back up to 1 ppd.   HA and GERD - was on pantoprazole which may have caused HA. HAs improved off pantoprazole. GERD currently managed with pepcid 20mg  qd prn - but with ongoing heartburn. HAs have improved off pantoprazole. Denies dysphagia, early satiety, unexpected weight loss. Was planning [facility] [place] however [facility] is not in network. Has been referred to [facility] to establish care. Also due for colonoscopy.   CAD - recent lexiscan myoview 04/2020 showed small fixed mid inferior perfusion defect thought to represent scar without associated ischemia, EF 43% with basal to mid inferior hypokinesis, stable from prior study 2019. Continues aspirin and plavix in h/o multiple stents.   Lung cancer screening - declined CT this year.   DM - does not regularly check sugars - last checked fasting last week 170. Compliant with antihyperglycemic regimen which includes: metformin 1000mg  bid, amaryl 4mg  daily, jardiance 25mg  daily (ran out over weekend - very expensive). Denies low sugars or hypoglycemic symptoms. Denies paresthesias. Last diabetic eye exam 2 months ago at [place] - sees regularly due to glaucoma. Pneumovax: 2014. Prevnar: 2015. Glucometer brand: accuchek? DSME: declined. Lab Results  Component Value Date   HGBA1C 7.4 (A) 07/12/2020   Diabetic Foot Exam - Simple   No data filed     Lab Results    Component Value Date   MICROALBUR 0.2 12/27/2007     COLONOSCOPY 11/2017through colostomy -TAx5, sessile serrated polyp, diverticulosis, rpt 2 yrs (Outlaw).     Relevant past medical, surgical, family and social history reviewed and updated as indicated. Interim medical history since our last visit reviewed. Allergies and medications reviewed and updated. Outpatient Medications Prior to Visit  Medication Sig Dispense Refill  . aspirin 81 MG tablet Take 81 mg by mouth daily.      Marland Kitchen atenolol (TENORMIN) 25 MG tablet Take 1 tablet (25 mg total) by mouth 2 (two) times daily. 180 tablet 3  . atorvastatin (LIPITOR) 40 MG tablet TAKE 1 TABLET BY MOUTH ONCE DAILY AT  6PM 90 tablet 3  . Brinzolamide-Brimonidine (SIMBRINZA) 1-0.2 % SUSP Apply 1 drop to eye 3 (three) times daily.     . cetirizine (ZYRTEC) 10 MG tablet Take 10 mg by mouth as needed for allergies.    Marland Kitchen clopidogrel (PLAVIX) 75 MG tablet Take 1 tablet by mouth once daily 90 tablet 3  . ezetimibe (ZETIA) 10 MG tablet Take 1 tablet (10 mg total) by mouth daily. 90 tablet 3  . famotidine (PEPCID) 20 MG tablet Take 1 tablet (20 mg total) by mouth at bedtime.    Marland Kitchen glimepiride (AMARYL) 4 MG tablet Take 1 tablet by mouth once daily with breakfast 90 tablet 2  . Glucosamine-Chondroit-Vit  C-Mn (GLUCOSAMINE 1500 COMPLEX PO) Take 1,500 mg by mouth daily.    Marland Kitchen ibuprofen (ADVIL,MOTRIN) 200 MG tablet Take 600 mg by mouth every 6 (six) hours as needed for mild pain.    Marland Kitchen latanoprost (XALATAN) 0.005 % ophthalmic solution Place 1 drop into both eyes at bedtime.     Marland Kitchen lisinopril (ZESTRIL) 5 MG tablet Take 1 tablet by mouth once daily 90 tablet 3  . metFORMIN (GLUCOPHAGE) 1000 MG tablet Take 1 tablet (1,000 mg total) by mouth 2 (two) times daily with a meal. 180 tablet 3  . nitroGLYCERIN (NITROSTAT) 0.4 MG SL tablet Place 1 tablet (0.4 mg total) under the tongue every 5 (five) minutes as needed for chest pain. 30 tablet 3  . omega-3 acid ethyl esters  (LOVAZA) 1 g capsule Take 2 capsules by mouth twice daily 360 capsule 3  . senna-docusate (SENOKOT-S) 8.6-50 MG tablet Take 1 tablet by mouth at bedtime as needed for mild constipation. 15 tablet 0  . empagliflozin (JARDIANCE) 25 MG TABS tablet Take 1 tablet (25 mg total) by mouth daily before breakfast. (Patient not taking: Reported on 07/12/2020) 30 tablet 6   No facility-administered medications prior to visit.     Per HPI unless specifically indicated in ROS section below Review of Systems Objective:  BP 126/60 (BP Location: Left Arm, Patient Position: Sitting, Cuff Size: Large)   Pulse (!) 50   Temp 97.9 F (36.6 C) (Temporal)   Ht 5\' 8"  (1.727 m)   Wt 229 lb 6 oz (104 kg)   SpO2 96%   BMI 34.88 kg/m   Wt Readings from Last 3 Encounters:  07/12/20 229 lb 6 oz (104 kg)  06/21/20 233 lb (105.7 kg)  05/11/20 226 lb (102.5 kg)      Physical Exam Vitals and nursing note reviewed.  Constitutional:      General: He is not in acute distress.    Appearance: Normal appearance. He is well-developed. He is obese. He is not ill-appearing.  Eyes:     General: No scleral icterus.    Extraocular Movements: Extraocular movements intact.     Conjunctiva/sclera: Conjunctivae normal.     Pupils: Pupils are equal, round, and reactive to light.  Cardiovascular:     Rate and Rhythm: Normal rate and regular rhythm.     Pulses: Normal pulses.     Heart sounds: Normal heart sounds. No murmur heard.   Pulmonary:     Effort: Pulmonary effort is normal. No respiratory distress.     Breath sounds: Normal breath sounds. No wheezing, rhonchi or rales.     Comments: coarse Musculoskeletal:     Right lower leg: No edema.     Left lower leg: No edema.     Comments: See HPI for foot exam if done  Skin:    General: Skin is warm and dry.     Comments: Pustules on erythematous base throughout BLE - after recent ant bites  Neurological:     Mental Status: He is alert.  Psychiatric:        Mood and  Affect: Mood normal.        Behavior: Behavior normal.       Results for orders placed or performed in visit on 07/12/20  POCT glycosylated hemoglobin (Hb A1C)  Result Value Ref Range   Hemoglobin A1C 7.4 (A) 4.0 - 5.6 %   HbA1c POC (<> result, manual entry)     HbA1c, POC (prediabetic range)  HbA1c, POC (controlled diabetic range)     Assessment & Plan:  This visit occurred during the SARS-CoV-2 public health emergency.  Safety protocols were in place, including screening questions prior to the visit, additional usage of staff PPE, and extensive cleaning of exam room while observing appropriate contact time as indicated for disinfecting solutions.   Problem List Items Addressed This Visit    Smoker    He quit for 6 weeks earlier this year, has restarted. Encouraged retrial nicoderm CQ patches      GERD (gastroesophageal reflux disease)    Pantoprazole stopped due to HA's.  Persistent heartburn, has only been using pepcid 20mg  QD PRN GERD. Discussed daily use. Will trial dexilant 60mg  once daily and refer to GI.  Avoid omeprazole and nexium due to plavix use.       Relevant Medications   dexlansoprazole (DEXILANT) 60 MG capsule   Other Relevant Orders   Ambulatory referral to Gastroenterology   Diabetes mellitus type 2, uncontrolled, with complications ([place]) - Primary    Chronic, continues improving but ran out of jardiance and now unaffordable. I asked him to price out other SGLT2s with insurance, [facility] about [facility], and let me know what he finds out. Continue amaryl and metformin at this time.  Eye exam regularly through [place] - I asked him to bring me next eye exam report.       Relevant Orders   POCT glycosylated hemoglobin (Hb A1C) (Completed)   CAD in native artery    Appreciate cards care. Continue aspirin, statin, plavix.        Other Visit Diagnoses    Special screening for malignant neoplasms, colon       Relevant Orders   Ambulatory  referral to Gastroenterology       Meds ordered this encounter  Medications  . dexlansoprazole (DEXILANT) 60 MG capsule    Sig: Take 1 capsule (60 mg total) by mouth daily.    Dispense:  30 capsule    Refill:  1   Orders Placed This Encounter  Procedures  . Ambulatory referral to Gastroenterology    Referral Priority:   Routine    Referral Type:   Consultation    Referral Reason:   Specialty Services Required    Number of Visits Requested:   1  . POCT glycosylated hemoglobin (Hb A1C)    Patient Instructions  Try dexilant for heartburn. We will refer you to GI.  Price out [employer] (same family as jardiance - as pharmacy or insurance which is cheaper). Also [facility] about [facility]ardiance [facility]. Let me know what you find out.  Bring me copy of eye exam.  Return as needed or in 6 months for wellness visit/physical.    Follow up plan: Return in about 6 months (around 01/09/2021) for annual exam, prior fasting for blood work, medicare wellness visit.  [name], MD

## 2020-07-12 NOTE — Assessment & Plan Note (Signed)
Pantoprazole stopped due to HA's.  Persistent heartburn, has only been using pepcid 20mg  QD PRN GERD. Discussed daily use. Will trial dexilant 60mg  once daily and refer to GI.  Avoid omeprazole and nexium due to plavix use.

## 2020-07-12 NOTE — Patient Instructions (Addendum)
Try dexilant for heartburn. We will refer you to GI.  Price out [employer] (same family as jardiance - as pharmacy or insurance which is cheaper). Also [facility]ardiance [facility]. Let me know what you find out.  Bring me copy of eye exam.  Return as needed or in 6 months for wellness visit/physical.

## 2020-07-12 NOTE — Assessment & Plan Note (Signed)
Appreciate cards care. Continue aspirin, statin, plavix.

## 2020-07-12 NOTE — Assessment & Plan Note (Signed)
He quit for 6 weeks earlier this year, has restarted. Encouraged retrial nicoderm CQ patches

## 2020-07-16 ENCOUNTER — Ambulatory Visit (INDEPENDENT_AMBULATORY_CARE_PROVIDER_SITE_OTHER): Payer: No Typology Code available for payment source | Admitting: Physician Assistant

## 2020-07-16 ENCOUNTER — Encounter: Payer: Self-pay | Admitting: Physician Assistant

## 2020-07-16 VITALS — BP 124/54 | HR 56 | Ht 67.75 in | Wt 230.4 lb

## 2020-07-16 DIAGNOSIS — Z7901 Long term (current) use of anticoagulants: Secondary | ICD-10-CM

## 2020-07-16 DIAGNOSIS — R0789 Other chest pain: Secondary | ICD-10-CM

## 2020-07-16 DIAGNOSIS — K219 Gastro-esophageal reflux disease without esophagitis: Secondary | ICD-10-CM | POA: Diagnosis not present

## 2020-07-16 DIAGNOSIS — Z86010 Personal history of colonic polyps: Secondary | ICD-10-CM | POA: Diagnosis not present

## 2020-07-16 MED ORDER — SUTAB 1479-225-188 MG PO TABS: 1.0000 | ORAL_TABLET | Freq: Once | ORAL | 0 refills | Status: AC

## 2020-07-16 NOTE — Progress Notes (Signed)
I agree with the above note, plan 

## 2020-07-16 NOTE — Progress Notes (Signed)
Chief Complaint: GERD  HPI:    Mr. Evan Avery is a 74 year old male with past medical history of CAD status post stent placement on Plavix Evan Avery 04/2020 with small fixed mid inferior perfusion defect thought to represent scar without associated ischemia, EF 43% with basal to mid inferior hypokinesis) and multiple others listed below, who was referred to me by Evan Bush, MD for a complaint of GERD.      09/13/2016 colonoscopy with Dr. Paulita Avery with "too many" 3-8 mm polyps in the rectum, diverticulosis in the sigmoid, descending and transverse colon, 2 2-3 mm polyps in the descending colon and transverse colon, a few 5-6 mm polyps in the descending colon and transverse colon and a nodule in the cecum which is a possible mucocele or possible pancreatic rest. Pathology showed sessile serrated polyp, tubular adenomas, polypoid colonic mucosa negative for dysplasia and hyperplastic polyps.  Repeat was then recommended in 2 years.    05/03/2020 office visit with Dr. Claiborne Avery in cardiology and at that time describes several episodes of chest heaviness.  He was scheduled for a stress test.    05/11/2020 stress test with mild perfusion defect most likely scar with associated ischemia, LVEF 43% but visually appeared higher, no significant change from prior study.    07/12/2020 patient saw PCP and discussed reflux.  Apparently he had been on pantoprazole which was thought to possibly cause headaches as they had improved off of pantoprazole.  His reflux is being managed with Pepcid 20 mg daily as needed but he had ongoing heartburn.  Apparently saw someone at the [place] who recommend he undergo an EGD, he is going to return to [facility] but they are not in his network anymore.  He was referred to our clinic for EGD and colonoscopy.    Today, the patient presents to clinic and tells me that he started with some chest pain a few months ago which seemed to be while he was bending over in the yard etc. and sometimes  radiate down into his left arm.  He eventually talked to his doctor at the [place] about this and they ordered a stress test from his regular cardiologist.  This was normal compared to previous and it was thought this was likely related to reflux instead.  Patient was started on Pantoprazole which he took for about a month but he developed terrible headaches about 4-6 a day while on this medication and read this could be a side effect so he stopped it, now he only gets occasional headaches so he does think it was the culprit.  He was started on Pepcid 20 mg daily which he takes in the evening instead.  He still continues with some chest pain episodes about 2 to 3-week but when these occur he will just take some Tums and typically they feel better.  He also feels some acid in his throat when he wakes up in the morning, but this is some better after starting to take his Pepcid in the evenings.    Also describes that he is due for his colonoscopy because he was told "I had a lot of polyps" on his last one.    Patient has had both of his Covid vaccines and his flu vaccine.    Denies fever, chills, weight loss, change in bowel habits or abdominal pain.  Past Medical History:  Diagnosis Date  . Agent orange exposure 1968  . Arthritis   . CAD (coronary artery disease)    4 stents  in right artery  . Complex tear of medial meniscus of left knee as current injury 07/26/2012   [facility]  . Diabetes mellitus    type 2  . Diverticulitis of colon with perforation 05/23/2016  . Glaucoma 2006   on drops, followed by [place]  . Hyperlipidemia   . Hypertension   . Intra-abdominal abscess ([facility]) 05/16/2016  . LUMBAR RADICULOPATHY 07/18/2007   Qualifier: Diagnosis of  By: Evan Morale MD, Evan Avery   . Myocardial infarction [facility])    1992  . Sleep apnea    uses CPAP regularly    Past Surgical History:  Procedure Laterality Date  . APPENDECTOMY  1958  . BOWEL RESECTION N/A 05/23/2016   Procedure: SMALL BOWEL RESECTION;  Surgeon: Evan Confer, MD;  Location: [facility];  Service: General;  Laterality: N/A;  . CARDIAC CATHETERIZATION  08/11/2008   angiographically patent LAD diagonal, circumflex and multiple circumflex obtuse marginal branches from the L coronary artery; stenting of mid R coronary artery with 3.5 x 33mm Liberte non DES postdilated w/ 4.0 DuraStent Evan Avery)  . CARDIOVASCULAR STRESS TEST  06/16/2011   R/P MV - moderate perfusion defect in basal inferoseptal, basal inferior, mid inferseptal and mid inferior regions, consistent with infarct/scar and/or diaphragmatic attenuation; non-gated study secondary to ectopy; no CP or EKG changes for ischemia; abnormal study although no significant changes from previous study; in the absense of gated images cannot calculate EF or distinguish scar/artifact  . COLONOSCOPY  08/2016   TAx5, sessile serrated polyp, diverticulosis, rpt 2 yrs ([place])  . COLOSTOMY Left 05/23/2016   Procedure: COLOSTOMY;  Surgeon: Evan Confer, MD;  Location: [facility];  Service: General;  Laterality: Left;  . COLOSTOMY TAKEDOWN N/A 11/10/2016   Procedure: LAPAROSCOPIC LYSIS OF ADHESIONS AND  COLOSTOMY CLOSURE;  Surgeon: Evan Confer, MD;  Location: [facility];  Service: General;  Laterality: N/A;  . CYST EXCISION  04/2018   posterior neck (Evan Avery)  . DOPPLER ECHOCARDIOGRAPHY  08/20/2008   EF >94%; LV systolic function normal; LV mildly dilated; doppler flow suggestive of impaired LV relaxation; RV mildly dilated, RV systolic function normal, RV systolic pressure normal  . INCISION AND DRAINAGE ABSCESS N/A 05/23/2016   Procedure: DRAINAGE OF MULTIPLE INTRA  ABDOMINAL ABSCESS;  Surgeon: Evan Confer, MD;  Location: [facility];  Service: General;  Laterality: N/A;  . KNEE SURGERY Left 2015   torn meniscus Raliegh Ip)  . LAPAROSCOPIC LYSIS OF ADHESIONS  11/10/2016   Procedure: LAPAROSCOPIC LYSIS OF ADHESIONS;  Surgeon: Evan Confer, MD;  [facility];  Service: General;;  . LAPAROTOMY N/A 05/23/2016    Procedure: EXPLORATORY LAPAROTOMY;  Surgeon: Evan Confer, MD;  [facility];  Service: General;  Laterality: N/A;  . MASS EXCISION N/A 05/22/2018   Procedure: EXCISION OF CYST ON POSTERIOR NECK ERAS PATHWAY;  Surgeon: Erroll Luna, MD;  Location: [facility];  Service: General;  Laterality: N/A;  . PERCUTANEOUS CORONARY STENT INTERVENTION (PCI-S)  1992, 1999, 2004   4 vessels Evan Avery)  . PROCTOSCOPY  11/10/2016   Procedure: PROCTOSCOPY;  Surgeon: Evan Confer, MD;  Location: [facility];  Service: General;;    Current Outpatient Medications  Medication Sig Dispense Refill  . aspirin 81 MG tablet Take 81 mg by mouth daily.      Mar[place] Kitchen atenolol (TENORMIN) 25 MG tablet Take 1 tablet (25 mg total) by mouth 2 (two) times daily. 180 tablet 3  . atorvastatin (LIPITOR) 40 MG tablet TAKE 1 TABLET BY MOUTH ONCE DAILY AT  6PM 90 tablet 3  . Brinzolamide-Brimonidine (SIMBRINZA) 1-0.2 % SUSP Apply 1 drop to eye 3 (three) times daily.     . cetirizine (ZYRTEC) 10 MG tablet Take 10 mg by mouth as needed for allergies.    Marland Kitchen clopidogrel (PLAVIX) 75 MG tablet Take 1 tablet by mouth once daily 90 tablet 3  . dexlansoprazole (DEXILANT) 60 MG capsule Take 1 capsule (60 mg total) by mouth daily. 30 capsule 1  . empagliflozin (JARDIANCE) 25 MG TABS tablet Take 1 tablet (25 mg total) by mouth daily before breakfast. (Patient not taking: Reported on 07/12/2020) 30 tablet 6  . ezetimibe (ZETIA) 10 MG tablet Take 1 tablet (10 mg total) by mouth daily. 90 tablet 3  . famotidine (PEPCID) 20 MG tablet Take 1 tablet (20 mg total) by mouth at bedtime.    Marland Kitchen glimepiride (AMARYL) 4 MG tablet Take 1 tablet by mouth once daily with breakfast 90 tablet 2  . Glucosamine-Chondroit-Vit C-Mn (GLUCOSAMINE 1500 COMPLEX PO) Take 1,500 mg by mouth daily.    Marland Kitchen ibuprofen (ADVIL,MOTRIN) 200 MG tablet Take 600 mg by mouth every 6 (six) hours as needed for mild pain.    Marland Kitchen latanoprost (XALATAN) 0.005 % ophthalmic solution Place 1  drop into both eyes at bedtime.     Marland Kitchen lisinopril (ZESTRIL) 5 MG tablet Take 1 tablet by mouth once daily 90 tablet 3  . metFORMIN (GLUCOPHAGE) 1000 MG tablet Take 1 tablet (1,000 mg total) by mouth 2 (two) times daily with a meal. 180 tablet 3  . nitroGLYCERIN (NITROSTAT) 0.4 MG SL tablet Place 1 tablet (0.4 mg total) under the tongue every 5 (five) minutes as needed for chest pain. 30 tablet 3  . omega-3 acid ethyl esters (LOVAZA) 1 g capsule Take 2 capsules by mouth twice daily 360 capsule 3  . senna-docusate (SENOKOT-S) 8.6-50 MG tablet Take 1 tablet by mouth at bedtime as needed for mild constipation. 15 tablet 0   No current facility-administered medications for this visit.    Allergies as of 07/16/2020 - Review Complete 07/12/2020  Allergen Reaction Noted  . Dilaudid [hydromorphone hcl] Itching 11/07/2016  . Ambien [zolpidem] Other (See Comments) 11/10/2016  . Niacin and related Other (See Comments) 04/13/2013    Family History  Problem Relation Age of Onset  . Alzheimer's disease Mother   . Heart disease Father   . Heart attack Brother   . Diabetes Brother   . Heart disease Maternal Grandmother   . Cancer Maternal Grandfather   . Diabetes Paternal Grandfather     Social History   Socioeconomic History  . Marital status: Married    Spouse name: Not on file  . Number of children: Not on file  . Years of education: Not on file  . Highest education level: Not on file  Occupational History  . Not on file  Tobacco Use  . Smoking status: Current Every Day Smoker    Packs/day: 1.00    Years: 50.00    Pack years: 50.00    Types: Cigarettes    Start date: 10/23/1958  . Smokeless tobacco: Never Used  Vaping Use  . Vaping Use: Never used  Substance and Sexual Activity  . Alcohol use: Yes    Alcohol/week: 0.0 standard drinks    Comment: rarely  . Drug use: No  . Sexual activity: Yes  Other Topics Concern  . Not on file  Social History Narrative   Lives with wife and  son (with mental issues)  Occupation: retired, was Multimedia programmer for [employer]   Activity: no regular exercise   Diet: good water, fruits/vegetables daily   Social Determinants of Radio broadcast assistant Strain: Medium Risk  . Difficulty of Paying Living Expenses: Somewhat hard  Food Insecurity: No Food Insecurity  . Worried About Charity fundraiser in the Last Year: Never true  . Ran Out of Food in the Last Year: Never true  Transportation Needs: No Transportation Needs  . Lack of Transportation (Medical): No  . Lack of Transportation (Non-Medical): No  Physical Activity: Inactive  . Days of Exercise per Week: 0 days  . Minutes of Exercise per Session: 0 min  Stress: No Stress Concern Present  . Feeling of Stress : Not at all  Social Connections:   . Frequency of Communication with Friends and Family: Not on file  . Frequency of Social Gatherings with Friends and Family: Not on file  . Attends Religious Services: Not on file  . Active Member of Clubs or Organizations: Not on file  . Attends Archivist Meetings: Not on file  . Marital Status: Not on file  Intimate Partner Violence: Not At Risk  . Fear of Current or Ex-Partner: No  . Emotionally Abused: No  . Physically Abused: No  . Sexually Abused: No    Review of Systems:    Constitutional: No weight loss, fever or chills Skin: No rash  Cardiovascular: No heart plapitations Respiratory: No SOB  Gastrointestinal: See HPI and otherwise negative Genitourinary: No dysuria  Neurological: No headache, dizziness or syncope Musculoskeletal: No new muscle or joint pain Hematologic: No bleeding Psychiatric: No history of depression or anxiety   Physical Exam:  Vital signs: BP (!) 124/54 (BP Location: Left Arm, Patient Position: Sitting, Cuff Size: Normal)   Pulse (!) 56   Ht 5' 7.75" (1.721 m)   Wt 230 lb 6 oz (104.5 kg)   BMI 35.29 kg/m   Constitutional:   Pleasant Caucasian male appears to be in NAD,  Well developed, Well nourished, alert and cooperative Head:  Normocephalic and atraumatic. Eyes:   PEERL, EOMI. No icterus. Conjunctiva pink. Ears:  Normal auditory acuity. Neck:  Supple Throat: Oral cavity and pharynx without inflammation, swelling or lesion.  Respiratory: Respirations even and unlabored. Lungs clear to auscultation bilaterally.   No wheezes, crackles, or rhonchi.  Cardiovascular: Normal S1, S2. No MRG. Regular rate and rhythm. No peripheral edema, cyanosis or pallor.  Gastrointestinal:  Soft, nondistended, nontender. No rebound or guarding. Normal bowel sounds. No appreciable masses or hepatomegaly. Rectal:  Not performed.  Msk:  Symmetrical without gross deformities. Without edema, no deformity or joint abnormality.  Neurologic:  Alert and  oriented x4;  grossly normal neurologically.  Skin:   Dry and intact without significant lesions or rashes. Psychiatric:  Demonstrates good judgement and reason without abnormal affect or behaviors.  RELEVANT LABS AND IMAGING: CBC    Component Value Date/Time   WBC 8.2 03/12/2020 0000   WBC 7.7 01/02/2020 0835   RBC 4.23 01/02/2020 0835   HGB 14.0 03/12/2020 0000   HCT 39.7 01/02/2020 0835   PLT 133 (A) 03/12/2020 0000   MCV 93.8 01/02/2020 0835   MCH 30.6 11/11/2016 0523   MCHC 34.3 01/02/2020 0835   RDW 14.4 01/02/2020 0835   LYMPHSABS 1.6 01/02/2020 0835   MONOABS 0.6 01/02/2020 0835   EOSABS 0.4 01/02/2020 0835   BASOSABS 0.1 01/02/2020 0835    CMP     Component Value  Date/Time   NA 139 03/12/2020 0000   K 4.9 03/12/2020 0000   CL 104 01/02/2020 0835   CO2 28 01/02/2020 0835   GLUCOSE 238 (H) 01/02/2020 0835   BUN 20 01/02/2020 0835   CREATININE 1.2 03/12/2020 0000   CREATININE 1.05 01/02/2020 0835   CREATININE 0.96 03/13/2016 0803   CALCIUM 9.5 01/02/2020 0835   PROT 6.3 01/02/2020 0835   ALBUMIN 4.0 01/02/2020 0835   AST 17 03/12/2020 0000   ALT 35 03/12/2020 0000   ALKPHOS 63 03/12/2020 0000   BILITOT  0.7 01/02/2020 0835   GFRNONAA >60 05/15/2018 1527   GFRAA >60 05/15/2018 1527    Assessment: 1.  GERD/atypical chest pain: Has been evaluated with a stress test by his cardiologist, thought to be GI in origin, does express some reflux symptoms in the mornings, symptoms are some better with Tums and Pepcid 2.  History of adenomatous polyps: Last colonoscopy 2017 with recommendations for repeat in 2 years due to "many" adenomatous polyps  3.  Chronic anticoagulation: With Plavix for CAD status post stenting years ago  Plan: 1.  We will start the patient on Omeprazole 20 mg every morning, 20 to 30 minutes before breakfast #30 with 5 refills.  (Patient has been on Pantoprazole in the past which gave him headaches, told him if he starts with increased headaches after starting Omeprazole he can discontinue). 2.  Continue Pepcid 20 mg nightly 3.  Scheduled the patient for a diagnostic EGD and a surveillance colonoscopy in the [facility] with Dr. Ardis Hughs.  Did discuss risks, benefits, limitations and alternatives and patient agrees to proceed.  He has had both of his Covid vaccines. 4.  Patient was advised to hold his Plavix for 5 days prior to time of procedures.  We will contact his cardiologist Dr. Claiborne Avery to ensure that holding his Plavix is acceptable for him. 6.  Patient to follow in clinic per recommendations from Dr. Ardis Hughs after time of procedures.  Ellouise Newer, PA-C [facility] 07/16/2020, 11:20 AM  Cc: Evan Bush, MD

## 2020-07-16 NOTE — Patient Instructions (Addendum)
If you are age 75 or older, your body mass index should be between 23-30. Your Body mass index is 35.29 kg/m. If this is out of the aforementioned range listed, please consider follow up with your Primary Care Provider.  If you are age 44 or younger, your body mass index should be between 19-25. Your Body mass index is 35.29 kg/m. If this is out of the aformentioned range listed, please consider follow up with your Primary Care Provider.   We have sent the following medications to your pharmacy for you to pick up at your convenience: Omeprazole 20 mg daily 20-30 minutes before breakfast.   Use Pepcid 20 mg each evening.  You will be contacted by our office prior to your procedure for directions on holding your Plavix.  If you do not hear from our office 1 week prior to your scheduled procedure, please call [phone] to discuss.   You have been scheduled for an endoscopy and colonoscopy. Please follow the written instructions given to you at your visit today. Please pick up your prep supplies at the pharmacy within the next 1-3 days. If you use inhalers (even only as needed), please bring them with you on the day of your procedure.  Due to recent changes in healthcare laws, you may see the results of your imaging and laboratory studies on MyChart before your provider has had a chance to review them.  We understand that in some cases there may be results that are confusing or concerning to you. Not all laboratory results come back in the same time frame and the provider may be waiting for multiple results in order to interpret others.  Please give [place] 48 hours in order for your provider to thoroughly review all the results before contacting the office for clarification of your results.

## 2020-07-26 ENCOUNTER — Other Ambulatory Visit: Payer: Self-pay | Admitting: Adult Health

## 2020-08-23 HISTORY — PX: ESOPHAGOGASTRODUODENOSCOPY: SHX1529

## 2020-08-23 HISTORY — PX: COLONOSCOPY: SHX174

## 2020-08-27 ENCOUNTER — Telehealth: Payer: Self-pay | Admitting: *Deleted

## 2020-08-27 NOTE — Telephone Encounter (Signed)
Agree with recs. Could start with flonase nasal steroid and nasal saline irrigation, hold flonase if nosebleeds develop.

## 2020-08-27 NOTE — Telephone Encounter (Signed)
Patient left a voicemail stating that he is having problems with his sinuses and would like for Dr. Danise Mina to prescribe something for him.   Called patient back and was advised by his wife that he has gone to pick his grandchild up from school. Was advised that patient has had some head congestion for a couple of days. Patient's wife was advised that our providers usually will not call something in without some type of visit. Patient's wife denies that he has any other symptoms. Offered patient a virtual visit tomorrow with Dr. Damita Dunnings and she stated that they are not capable of doing a virtual visit. Patient's wife stated that they could probably go to one of their children's home tomorrow and have them help with a virtual visit. Patient's wife stated that she may call back after talking with her husband and set up a virtual visit for tomorrow. ER precautions given to patients wife and she verbalized understanding.

## 2020-08-30 DIAGNOSIS — M170 Bilateral primary osteoarthritis of knee: Secondary | ICD-10-CM | POA: Diagnosis not present

## 2020-08-30 NOTE — Telephone Encounter (Addendum)
Spoke with pt relaying Dr. Synthia Innocent message.  Pt verbalizes understanding but still requesting zpack be sent to the pharmacy and declining visit.  States has chronic sinus issues 3-4x a yr and has to take abx to clear it up.  Says he will try Dr. Synthia Innocent suggestions but still wants an abx.

## 2020-09-01 ENCOUNTER — Telehealth (INDEPENDENT_AMBULATORY_CARE_PROVIDER_SITE_OTHER): Payer: No Typology Code available for payment source | Admitting: Family Medicine

## 2020-09-01 ENCOUNTER — Encounter: Payer: Self-pay | Admitting: Family Medicine

## 2020-09-01 DIAGNOSIS — B9689 Other specified bacterial agents as the cause of diseases classified elsewhere: Secondary | ICD-10-CM | POA: Diagnosis not present

## 2020-09-01 DIAGNOSIS — I10 Essential (primary) hypertension: Secondary | ICD-10-CM | POA: Diagnosis not present

## 2020-09-01 DIAGNOSIS — J019 Acute sinusitis, unspecified: Secondary | ICD-10-CM

## 2020-09-01 MED ORDER — FLUTICASONE PROPIONATE 50 MCG/ACT NA SUSP: 2.0000 | Freq: Every day | NASAL | 1 refills | Status: DC

## 2020-09-01 MED ORDER — AMOXICILLIN-POT CLAVULANATE 875-125 MG PO TABS: 1.0000 | ORAL_TABLET | Freq: Two times a day (BID) | ORAL | 0 refills | Status: AC

## 2020-09-01 NOTE — Assessment & Plan Note (Signed)
Anticipate bacterial given duration and progression of symptoms, unilateral nature and recent head CT report. Rx augmentin 10d course. rec start flonase nasal spray. rec stop mucinex sinus relief which is raising his blood pressure.

## 2020-09-01 NOTE — Assessment & Plan Note (Signed)
Chronic, deteriorated with decongestant use. Advised stop mucinex sinus relief, monitor BP at home and let me know if persistently elevated.

## 2020-09-01 NOTE — Telephone Encounter (Signed)
Spoke with patient. He has agreed to do phone visit. Please call him at [phone].

## 2020-09-01 NOTE — Telephone Encounter (Signed)
Needs eval. Would offer at least phone visit at 4:30pm today.

## 2020-09-01 NOTE — Progress Notes (Signed)
[name] - 74 y.o. male  MRN [id number]  Date of Birth: [birth year]  PCP: [name], MD  This service was provided via telemedicine. Phone Visit performed on 09/01/2020    Rationale for phone visit along with limitations reviewed. I discussed the limitations, risks, security and privacy concerns of performing a phone visit and the availability of in person appointments. I also discussed with the patient that there may be a patient responsible charge related to this service. Patient consented to telephone encounter.    Location of patient: at home Location of provider: in office [place] @ [facility] Name of referring provider: N/A   Names of persons and role in encounter: Provider: [name], MD  Patient: Evan Avery  Other: N/A    Time on call: 4:39pm - 4:58pm   Subjective: Chief Complaint  Patient presents with  . Sinus Problem    C/o sinus pressure, congestion and drainage. States issue is chronic and had episodes 3-4x yearly.  Zpack seems to be the only thing to help.      HPI:  Endorses 1-2 month h/o facial pain and soreness under left eye. Chronic headache ?sinus related. No redness or swelling to face. Colored mucous when blowing nose in the mornings. Has been taking mucinex sinus (acetaminophen, guaifenesin and phenylephrine 5mg ).   No significant head or chest congestion, mild cough, no fevers/chills, ear or tooth pain, ST or PNdrainage. No abd pain or nausea.   A few weeks ago had head CT done through the [place] for ongoing headaches - brain looked ok but told sinuses were inflamed.   BP elevation - despite atenolol 25mg  bid, lisinopril 5mg  daily.  BP Readings from Last 3 Encounters:  09/01/20 (!) 172/81  07/16/20 (!) 124/54  07/12/20 126/60  Advised to monitor BP at home, call me with readings in 2 weeks. Likely from decongestant use.   Upcoming EGD/colonoscopy scheduled for Friday. Also prescribed omeprazole 20mg  daily (he did not start  this), continued pepcid 20mg  nightly.   Seeing ortho Dr Mardelle Matte last week for ongoing R knee pain. Planning partial knee replacement. Needs A1c <7.6%. also discussing visco-supplementation injections.  Lab Results  Component Value Date   HGBA1C 7.4 (A) 07/12/2020       Objective/Observations:  No physical exam or vital signs collected unless specifically identified below.   BP (!) 172/81   Pulse (!) 55   Temp (!) 97.5 F (36.4 C)   Ht 5' 7.75" (1.721 m)   Wt 225 lb (102.1 kg)   BMI 34.46 kg/m    Respiratory status: speaks in complete sentences without evident shortness of breath.   Assessment/Plan:  Acute bacterial sinusitis Anticipate bacterial given duration and progression of symptoms, unilateral nature and recent head CT report. Rx augmentin 10d course. rec start flonase nasal spray. rec stop mucinex sinus relief which is raising his blood pressure.   Essential hypertension Chronic, deteriorated with decongestant use. Advised stop mucinex sinus relief, monitor BP at home and let me know if persistently elevated.    I discussed the assessment and treatment plan with the patient. The patient was provided an opportunity to ask questions and all were answered. The patient agreed with the plan and demonstrated an understanding of the instructions.  Lab Orders  No laboratory test(s) ordered today    Meds ordered this encounter  Medications  . amoxicillin-clavulanate (AUGMENTIN) 875-125 MG tablet    Sig: Take 1 tablet by mouth 2 (two) times daily for 10 days.  Dispense:  20 tablet    Refill:  0  . fluticasone (FLONASE) 50 MCG/ACT nasal spray    Sig: Place 2 sprays into both nostrils daily.    Dispense:  16 g    Refill:  1    The patient was advised to call back or seek an in-person evaluation if the symptoms worsen or if the condition fails to improve as anticipated.  [name], MD

## 2020-09-03 ENCOUNTER — Ambulatory Visit (AMBULATORY_SURGERY_CENTER): Payer: No Typology Code available for payment source | Admitting: Gastroenterology

## 2020-09-03 ENCOUNTER — Encounter: Payer: Self-pay | Admitting: Gastroenterology

## 2020-09-03 ENCOUNTER — Other Ambulatory Visit: Payer: Self-pay

## 2020-09-03 VITALS — BP 131/56 | HR 48 | Temp 97.0°F | Resp 14 | Ht 67.0 in | Wt 230.0 lb

## 2020-09-03 DIAGNOSIS — Z86010 Personal history of colonic polyps: Secondary | ICD-10-CM

## 2020-09-03 DIAGNOSIS — D120 Benign neoplasm of cecum: Secondary | ICD-10-CM

## 2020-09-03 DIAGNOSIS — D123 Benign neoplasm of transverse colon: Secondary | ICD-10-CM | POA: Diagnosis not present

## 2020-09-03 DIAGNOSIS — K319 Disease of stomach and duodenum, unspecified: Secondary | ICD-10-CM | POA: Diagnosis not present

## 2020-09-03 DIAGNOSIS — K2970 Gastritis, unspecified, without bleeding: Secondary | ICD-10-CM | POA: Diagnosis not present

## 2020-09-03 DIAGNOSIS — K2980 Duodenitis without bleeding: Secondary | ICD-10-CM | POA: Diagnosis not present

## 2020-09-03 DIAGNOSIS — D124 Benign neoplasm of descending colon: Secondary | ICD-10-CM

## 2020-09-03 DIAGNOSIS — K2100 Gastro-esophageal reflux disease with esophagitis, without bleeding: Secondary | ICD-10-CM

## 2020-09-03 MED ORDER — FAMOTIDINE 40 MG PO TABS
40.0000 mg | ORAL_TABLET | Freq: Two times a day (BID) | ORAL | 11 refills | Status: DC
Start: 2020-09-03 — End: 2022-08-23

## 2020-09-03 MED ORDER — SODIUM CHLORIDE 0.9 % IV SOLN: 500.0000 mL | Freq: Once | INTRAVENOUS | Status: DC

## 2020-09-03 NOTE — Progress Notes (Signed)
CW vitals and SH Iv.

## 2020-09-03 NOTE — Op Note (Signed)
[facility] Patient Name: Evan Avery Procedure Date: 09/03/2020 1:27 PM MRN: [id number] Endoscopist: Milus Banister , MD Age: 74 Referring MD:  Date of Birth: 09/13/1946 Gender: Male Account #: [id number] Procedure:                Upper GI endoscopy Indications:              Epigastric abdominal pain, Heartburn Medicines:                Monitored Anesthesia Care Procedure:                Pre-Anesthesia Assessment:                           - Prior to the procedure, a History and Physical                            was performed, and patient medications and                            allergies were reviewed. The patient's tolerance of                            previous anesthesia was also reviewed. The risks                            and benefits of the procedure and the sedation                            options and risks were discussed with the patient.                            All questions were answered, and informed consent                            was obtained. Prior Anticoagulants: The patient has                            taken Plavix (clopidogrel), last dose was 5 days                            prior to procedure. ASA Grade Assessment: III - A                            patient with severe systemic disease. After                            reviewing the risks and benefits, the patient was                            deemed in satisfactory condition to undergo the                            procedure.  After obtaining informed consent, the endoscope was                            passed under direct vision. Throughout the                            procedure, the patient's blood pressure, pulse, and                            oxygen saturations were monitored continuously. The                            Endoscope was introduced through the mouth, and                            advanced to the second part of duodenum. The upper                             GI endoscopy was accomplished without difficulty.                            The patient tolerated the procedure well. Scope In: Scope Out: Findings:                 Moderate to severe gastritis with several erosions                            in the stomach distally. Biopsies taken from the                            distal stomach and sent to pathology.                           Multiple large erosions throughout the visualized                            duodenum.                           The exam was otherwise without abnormality. Complications:            No immediate complications. Estimated blood loss:                            None. Estimated Blood Loss:     Estimated blood loss: none. Impression:               - Significant gastritis, duodenitis. This is very                            suspicious for NSAID/ASA related damage, however                            biopsies were taken from the stomach to check for  H. pylori.                           - The examination was otherwise normal. Recommendation:           - Patient has a contact number available for                            emergencies. The signs and symptoms of potential                            delayed complications were discussed with the                            patient. Return to normal activities tomorrow.                            Written discharge instructions were provided to the                            patient.                           - Resume previous diet.                           - Continue present medications. You need to                            completely stop all NSAIDS. It is OK to resume your                            plavix tomorrow.                           - Dr. Ardis Hughs will reach out to your cardiologist                            about you stopping aspirin since you are on plavix.                           - New prescription  written today: pepcid 40mg                             pills, take one pill twice daily, disp 1 month with                            11 refills.                           - Await pathology results. Milus Banister, MD 09/03/2020 2:23:09 PM This report has been signed electronically.

## 2020-09-03 NOTE — Op Note (Addendum)
[facility] Patient Name: Less Woolsey Procedure Date: 09/03/2020 1:28 PM MRN: [id number] Endoscopist: Milus Banister , MD Age: 74 Referring MD:  Date of Birth: 1946/01/22 Gender: Male Account #: [id number] Procedure:                Colonoscopy Indications:              High risk colon cancer surveillance: Personal                            history of colonic polyps; Colonoscopy 2017 Dr.                            Wonda Horner severel precancerous polyps removed Medicines:                Monitored Anesthesia Care Procedure:                Pre-Anesthesia Assessment:                           - Prior to the procedure, a History and Physical                            was performed, and patient medications and                            allergies were reviewed. The patient's tolerance of                            previous anesthesia was also reviewed. The risks                            and benefits of the procedure and the sedation                            options and risks were discussed with the patient.                            All questions were answered, and informed consent                            was obtained. Prior Anticoagulants plavix stopped 5                            days ago. ASA Grade Assessment: III - A patient                            with severe systemic disease. After reviewing the                            risks and benefits, the patient was deemed in                            satisfactory condition to undergo the procedure.  After obtaining informed consent, the colonoscope                            was passed under direct vision. Throughout the                            procedure, the patient's blood pressure, pulse, and                            oxygen saturations were monitored continuously. The                            Colonoscope was introduced through the anus and                            advanced to  the the cecum, identified by                            appendiceal orifice and ileocecal valve. The                            colonoscopy was performed without difficulty. The                            patient tolerated the procedure well. The quality                            of the bowel preparation was good. The ileocecal                            valve, appendiceal orifice, and rectum were                            photographed. Scope In: 1:34:32 PM Scope Out: 1:59:27 PM Scope Withdrawal Time: 0 hours 20 minutes 45 seconds  Total Procedure Duration: 0 hours 24 minutes 55 seconds  Findings:                 Four sessile polyps were found in the descending                            colon, transverse colon and cecum. The polyps were                            2 to 3 mm in size. These polyps were removed with a                            cold snare. Resection and retrieval were complete.                            jar 1                           There was a soft, 16XW, centrally umbilicated                            '  nodule' in the cecum with excessive mucous. This                            was similar although likely larger than the 98mm                            lesion described in 2017 Dr. [name] colonoscopy and                            I biopsied it with forceps.jar 2                           There were 15-20 soft, fleshy sessile polyps in the                            rectosigmoid (near the colo-colonic anastomosis and                            then distal). These were sampled with forceps. jar                            3.                           Normal colo-colonic anastomosis about 15cm from the                            anus.                           Small, soft typical appearing descending colon                            lipoma.                           The exam was otherwise without abnormality on                            direct and retroflexion  views. Complications:            No immediate complications. Estimated blood loss:                            None. Estimated Blood Loss:     Estimated blood loss: none. Impression:               - Four sessile polyps were found in the descending                            colon, transverse colon and cecum. The polyps were                            2 to 3 mm in size. These polyps were removed with a  cold snare. Resection and retrieval were complete.                            jar 1                           - There was a soft, 50YD, centrally umbilicated                            'nodule' in the cecum with excessive mucous. This                            was similar although likely larger than the 46mm                            lesion described in 2017 Dr. [name] colonoscopy and                            I biopsied it with forceps.jar 2                           - There were 15-20 soft, fleshy sessile polyps in                            the rectosigmoid (near the colo-colonic anastomosis                            and then distal). These were sampled with forceps.                            jar 3.                           - Normal colo-colonic anastomosis about 15cm from                            the anus.                           - Small, soft typical appearing descending colon                            lipoma.                           - The examination was otherwise normal on direct                            and retroflexion views. Recommendation:           - Await pathology results.                           - EGD now. Milus Banister, MD 09/03/2020 2:14:46 PM This report has been signed electronically.

## 2020-09-03 NOTE — Patient Instructions (Signed)
Please read handouts provided. Continue present medications. Await pathology results. Stop all NSAIDS. Resume Plavix tomorrow. Begin Pepcid 40 mg, one pill twice daily. Dr. Ardis Hughs will reach out to your cardiologist about stopping aspirin.       YOU HAD AN ENDOSCOPIC PROCEDURE TODAY AT [facility]:   Refer to the procedure report that was given to you for any specific questions about what was found during the examination.  If the procedure report does not answer your questions, please call your gastroenterologist to clarify.  If you requested that your care partner not be given the details of your procedure findings, then the procedure report has been included in a sealed envelope for you to review at your convenience later.  YOU SHOULD EXPECT: Some feelings of bloating in the abdomen. Passage of more gas than usual.  Walking can help get rid of the air that was put into your GI tract during the procedure and reduce the bloating. If you had a lower endoscopy (such as a colonoscopy or flexible sigmoidoscopy) you may notice spotting of blood in your stool or on the toilet paper. If you underwent a bowel prep for your procedure, you may not have a normal bowel movement for a few days.  Please Note:  You might notice some irritation and congestion in your nose or some drainage.  This is from the oxygen used during your procedure.  There is no need for concern and it should clear up in a day or so.  SYMPTOMS TO REPORT IMMEDIATELY:   Following lower endoscopy (colonoscopy or flexible sigmoidoscopy):  Excessive amounts of blood in the stool  Significant tenderness or worsening of abdominal pains  Swelling of the abdomen that is new, acute  Fever of 100F or higher   Following upper endoscopy (EGD)  Vomiting of blood or coffee ground material  New chest pain or pain under the shoulder blades  Painful or persistently difficult swallowing  New shortness of breath  Fever of  100F or higher  Black, tarry-looking stools  For urgent or emergent issues, a gastroenterologist can be reached at any hour by calling [phone]. Do not use MyChart messaging for urgent concerns.    DIET:  We do recommend a small meal at first, but then you may proceed to your regular diet.  Drink plenty of fluids but you should avoid alcoholic beverages for 24 hours.  ACTIVITY:  You should plan to take it easy for the rest of today and you should NOT DRIVE or use heavy machinery until tomorrow (because of the sedation medicines used during the test).    FOLLOW UP: Our staff will call the number listed on your records 48-72 hours following your procedure to check on you and address any questions or concerns that you may have regarding the information given to you following your procedure. If we do not reach you, we will leave a message.  We will attempt to reach you two times.  During this call, we will ask if you have developed any symptoms of COVID 19. If you develop any symptoms (ie: fever, flu-like symptoms, shortness of breath, cough etc.) before then, please call [phone].  If you test positive for Covid 19 in the 2 weeks post procedure, please call and report this information to [place].    If any biopsies were taken you will be contacted by phone or by letter within the next 1-3 weeks.  Please call us at [phone] if you have not  heard about the biopsies in 3 weeks.    SIGNATURES/CONFIDENTIALITY: You and/or your care partner have signed paperwork which will be entered into your electronic medical record.  These signatures attest to the fact that that the information above on your After Visit Summary has been reviewed and is understood.  Full responsibility of the confidentiality of this discharge information lies with you and/or your care-partner.

## 2020-09-03 NOTE — Progress Notes (Signed)
To PACU, VSS. Report to [email or web address] 

## 2020-09-06 ENCOUNTER — Telehealth: Payer: Non-veteran care | Admitting: Family Medicine

## 2020-09-06 DIAGNOSIS — M1711 Unilateral primary osteoarthritis, right knee: Secondary | ICD-10-CM | POA: Diagnosis not present

## 2020-09-07 ENCOUNTER — Telehealth: Payer: Self-pay

## 2020-09-07 ENCOUNTER — Other Ambulatory Visit: Payer: Self-pay | Admitting: Cardiovascular Disease

## 2020-09-07 ENCOUNTER — Telehealth: Payer: Self-pay | Admitting: *Deleted

## 2020-09-07 ENCOUNTER — Other Ambulatory Visit: Payer: Self-pay | Admitting: Family Medicine

## 2020-09-07 NOTE — Telephone Encounter (Signed)
No answer, left message to call back later today, B.Len Kluver RN. 

## 2020-09-07 NOTE — Telephone Encounter (Signed)
Second f/u call attempt.  LVM 

## 2020-09-13 DIAGNOSIS — M1711 Unilateral primary osteoarthritis, right knee: Secondary | ICD-10-CM | POA: Diagnosis not present

## 2020-09-20 ENCOUNTER — Ambulatory Visit: Payer: Non-veteran care

## 2020-09-20 ENCOUNTER — Other Ambulatory Visit: Payer: Self-pay

## 2020-09-20 DIAGNOSIS — I10 Essential (primary) hypertension: Secondary | ICD-10-CM

## 2020-09-20 DIAGNOSIS — K219 Gastro-esophageal reflux disease without esophagitis: Secondary | ICD-10-CM

## 2020-09-20 DIAGNOSIS — M1711 Unilateral primary osteoarthritis, right knee: Secondary | ICD-10-CM | POA: Diagnosis not present

## 2020-09-20 DIAGNOSIS — IMO0002 Reserved for concepts with insufficient information to code with codable children: Secondary | ICD-10-CM

## 2020-09-20 NOTE — Chronic Care Management (AMB) (Signed)
Chronic Care Management Pharmacy  Name: JOAL EAKLE  MRN: [id number] DOB: 02-07-1946  Chief Complaint/ HPI  Evan Avery,  74 y.o., male presents for their Follow-Up CCM visit with the clinical pharmacist via telephone.  PCP : [name], MD  Their chronic conditions include: hypertension, CAD, OSA on CPAP, COPD, fatty liver, GERD, type 2 diabetes, hyperlipidemia, tobacco use, glaucoma  Last CCM 04/06/20 - increased Jardiance to 25 mg daily  Patient concerns:  No problems with medications. Reports gastro found ulcers in stomach, stopped aspirin, continue Plavix, avoid all NSAIDs. Increased Pepcid 40 mg BID, has noticed improvement in GERD symptoms, depending on what he eats. Did not try Dexilant due to GI referral. Not taking Jardiance due to $500 copay, has been off since late Sept 2021. Does not know when he will see [facility] next to get [place] through [place].   Office Visits:  09/01/20: PCP - advised to check BP for 2 weeks, call with readings, likely with decongestant use, rx augmentin 10 day course, start flonase, stop mucinex   07/12/20 PCP visit - Try dexilant for heartburn. We will refer you to GI. Price out [employer] (same family as jardiance - as pharmacy or insurance which is cheaper). Also [facility] about [facility]. Let me know what you find out. Bring me copy of eye exam.    04/09/20: PCP visit - GERD, recommend trial pepcid 20 mg once or twice daily   01/08/20: PCP visit - DM compliant on metformin and glimepiride, cost concerns with starting new treatment, GLP1 unaffordable does not check sugars regularly, last CBG 190, price out Jardiance; acid reflux worsening, added pepcid 20 mg at last visit, verify pt is taking both then consider BID PPI, glaucoma followed by [facility]  /09/03/19: PCP visit - DM, both Januvia and Ozempic unaffordable, GERD, started on pantoprazole 40 mg daily per cardio due to chest pain last month with benefit, also  started on Zetia per cardio, split atenolol 75 mg daily to 50 mg AM and 25 mg PM to see if bradycardia improves, add Pepcid for GERD breakthrough symptoms  Consult Visit:  09/03/20: GI - moderate to severe gastritis, stop aspirin, resume plavix, stop all NSAIDs, pepcid 40 mg BID   06/21/20: Cardiology - decrease atenolol to 25 mg BID, try pantoprazole   05/03/20: Cardiology - stress test   11/10/19: Cardiology - CAD 11 month follow up, continues on DAPT due to multiple stents, BP controlled on atenolol and lisinopril, asymptomatic bradycardia, recommend close home monitoring, denies angina, CPAP through [place], 100% compliant, continue to smoke cigarettes  Allergies  Allergen Reactions   Dilaudid [Hydromorphone Hcl] Itching   Ambien [Zolpidem] Other (See Comments)    Pt states "makes me wild"   Niacin And Related Other (See Comments)    Extreme fatigue   Medications: Outpatient Encounter Medications as of 09/20/2020  Medication Sig Note   atenolol (TENORMIN) 25 MG tablet Take 1 tablet (25 mg total) by mouth 2 (two) times daily.    atorvastatin (LIPITOR) 40 MG tablet TAKE 1 TABLET BY MOUTH ONCE DAILY AT  6PM    Brinzolamide-Brimonidine (SIMBRINZA) 1-0.2 % SUSP Apply 1 drop to eye 3 (three) times daily.     cetirizine (ZYRTEC) 10 MG tablet Take 10 mg by mouth as needed for allergies.    clopidogrel (PLAVIX) 75 MG tablet Take 1 tablet by mouth once daily    Coenzyme Q10 (COQ10) 200 MG CAPS Take 1 capsule by  mouth daily.    ezetimibe (ZETIA) 10 MG tablet Take 1 tablet by mouth once daily    famotidine (PEPCID) 40 MG tablet Take 1 tablet (40 mg total) by mouth 2 (two) times daily.    fluticasone (FLONASE) 50 MCG/ACT nasal spray Place 2 sprays into both nostrils daily.    glimepiride (AMARYL) 4 MG tablet Take 1 tablet by mouth once daily with breakfast    latanoprost (XALATAN) 0.005 % ophthalmic solution Place 1 drop into both eyes at bedtime.     lisinopril (ZESTRIL) 5 MG tablet  Take 1 tablet by mouth once daily    metFORMIN (GLUCOPHAGE) 1000 MG tablet Take 1 tablet (1,000 mg total) by mouth 2 (two) times daily with a meal.    nitroGLYCERIN (NITROSTAT) 0.4 MG SL tablet Place 1 tablet (0.4 mg total) under the tongue every 5 (five) minutes as needed for chest pain. 07/16/2020: On hand   omega-3 acid ethyl esters (LOVAZA) 1 g capsule Take 2 capsules by mouth twice daily    senna-docusate (SENOKOT-S) 8.6-50 MG tablet Take 1 tablet by mouth at bedtime as needed for mild constipation.    [DISCONTINUED] aspirin 81 MG tablet Take 81 mg by mouth daily.      [DISCONTINUED] dexlansoprazole (DEXILANT) 60 MG capsule Take 1 capsule (60 mg total) by mouth daily.    [DISCONTINUED] ibuprofen (ADVIL,MOTRIN) 200 MG tablet Take 600 mg by mouth every 6 (six) hours as needed for mild pain.    No facility-administered encounter medications on file as of 09/20/2020.   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      Patient Stated     01/02/2020, I will maintain and continue medications as prescribed.      Pharmacy Care Plan     CARE PLAN ENTRY  Current Barriers:   Chronic Disease Management support, education, and care coordination needs related to Hypertension, Diabetes, and GERD   Hypertension BP Readings from Last 3 Encounters:  09/03/20 (!) 131/56  09/01/20 (!) 172/81  07/16/20 (!) 124/54   Pharmacist Clinical Goal(s): o Over the next 3 months, patient will work with PharmD and providers to maintain BP goal <140/90 mmHg  Current regimen:   Atenolol 25 mg - 1 tablet twice daily  Lisinopril 5 mg - 1 tablet daily  Interventions: o Comprehensive medication review o Reviewed home BP monitoring   Patient self care activities - Over the next 3 months,  patient will: o Continue medications as prescribed o Check home BP 3-4 days prior to appointments  o Ensure daily salt intake < 2300 mg/day  Diabetes Lab Results  Component Value Date/Time   HGBA1C 7.4 (A) 07/12/2020 08:02 AM   HGBA1C 8.2 03/12/2020 12:00 AM   HGBA1C 9.2 (H) 01/02/2020 08:35 AM   HGBA1C 8.9 (A) 09/03/2019 01:20 PM   HGBA1C 8.4 02/12/2019 12:00 AM   Home blood glucose monitoring: Fasting 177 (checking occasionally)  Pharmacist Clinical Goal(s): o Over the next 3 months, patient will work with PharmD and providers to achieve A1c goal <7%, fasting blood glucose 80-130, and 2 hours after meals < 180   Current regimen:   Glimepiride 4 mg - 1 tablet daily  Metformin 1000 mg - 1 tablet twice daily   Interventions: o Reviewed home  glucose log - recommend resuming BG checks 2-3 days weekly until stable  o Reviewed additional treatment options, including Actos, Ozempic, Januvia. Cost concerns with Ozempic and Januvia. Recommend starting Actos 15 mg daily.   Patient self care activities - Over the next 3 months, patient will: o Monitor blood sugar 2-3 days per week, before breakfast, document, and provide at future appointment with pharmacist o [facility] appointment for Jardiance 25 mg daily  o Avoid missed doses of medications and take them at the same time each day o Consider keeping a food diary and checking blood glucose 2 hours after meals o Review carbohydrate counting and the diabetes plate method o Contact provider with any episodes of hypoglycemia  Acid Reflux  Pharmacist Clinical Goal(s) o Over the next 3 months, patient will work with PharmD and providers to improve acid reflux symptoms   Current regimen:  o Pepcid 40 mg - 1 tablet twice daily o Tums - as needed  Interventions: o Reviewed triggers, lifestyle changes, and treatment options  Patient self care activities - Over the next 3 months, patient will: o Try cutting back on  coffee intake to see if symptoms improve.  o Watch for other possible triggers including high fat meals, chocolate, fried foods, peppermint, carbonated beverages, acidic foods. Quitting smoking can also improve acid reflux symptoms  o Eat small meals and avoid lying down for 2 hours hours after eating  Please see past updates related to this goal by clicking on the "Past Updates" button in the selected goal       Hypertension   CMP Latest Ref Rng & Units 03/12/2020 01/02/2020 02/18/2019  Glucose 70 - 99 mg/dL - 238(H) -  BUN 6 - 23 mg/dL - 20 -  Creatinine 0.6 - 1.3 1.2 1.05 1.1  Sodium 137 - 147 139 139 -  Potassium 3.4 - 5.3 4.9 4.7 4.7  Chloride 96 - 112 mEq/L - 104 -  CO2 19 - 32 mEq/L - 28 -  Calcium 8.4 - 10.5 mg/dL - 9.5 -  Total Protein 6.0 - 8.3 g/dL - 6.3 -  Total Bilirubin 0.2 - 1.2 mg/dL - 0.7 -  Alkaline Phos 25 - 125 63 64 71  AST 14 - 40 17 13 15   ALT 10 - 40 35 17 30   Office blood pressures are: BP Readings from Last 3 Encounters:  09/03/20 (!) 131/56  09/01/20 (!) 172/81  07/16/20 (!) 124/54   Patient has failed these meds in the past: none reported  BP goal < 140/90 mmHg Patient is currently controlled on the following medications:   Atenolol 25 mg - 1 tablet BID  Lisinopril 5 mg - 1 tablet daily  History of bradycardia on atenolol, dose reduced and split to BID to reduce bradycardia  Update 09/20/20:  BP checked today at home - 141/70, 52; previous clinic BP elevated 172/81 secondary to decongestant use.  Plan: Continue current medications  COPD / Tobacco   Last spirometry score: none, diagnosed per imaging  Eosinophil count:   Lab Results  Component Value Date/Time   EOSPCT 4.7 01/02/2020 08:35 AM  %                               Eos (Absolute):  Lab Results  Component Value Date/Time   EOSABS 0.4 01/02/2020 08:35 AM   Tobacco Status:  Social History   Tobacco Use  Smoking  Status Current Every Day Smoker   Packs/day: 1.00   Years:  50.00   Pack years: 50.00   Types: Cigarettes   Start date: 10/23/1958  Smokeless Tobacco Never Used   Patient has failed these meds in past: none reported Patient is currently controlled on the following medications:   No pharmacotherapy  We discussed: denies SOB, uses Zyrtec PRN Tobacco use: current 1 PPD, recently received Nicotine patches from [place] and has set quit date of 05/14/20 working on quitting with his wife and son   Update 09/20/20: pt resumed 1 PPD due to stress  Plan: Continue current medications  Diabetes   Recent Relevant Labs: Lab Results  Component Value Date/Time   HGBA1C 7.4 (A) 07/12/2020 08:02 AM   HGBA1C 8.2 03/12/2020 12:00 AM   HGBA1C 9.2 (H) 01/02/2020 08:35 AM   HGBA1C 8.9 (A) 09/03/2019 01:20 PM   HGBA1C 8.4 02/12/2019 12:00 AM   MICROALBUR 0.2 12/27/2007 10:00 AM    A1c goal < 7% Patient has failed these meds in past: Ozempic (denies trying, reports cost concern). Januvia (cost) Patient is currently uncontrolled on the following medications:   Jardiance 25 mg - 1 tablet daily (not taking - cost)  Glimepiride 4 mg - 1 tablet daily  Metformin 1000 mg - 1 tablet BID   Last diabetic eye exam: completed at [facility] 2021 per patient report  Last diabetic foot exam: completed by PCP 03/2020 Adherence: confirms adherence, < 5 day gap between refills Exercise: none due to bad joints, unable to walk, able to do some yardwork, dig weeds Diet: maintaining weight at 225 lbs   Update 09/20/20: Patient has been off Jardiance since late Sept 2021 due to cost. Not checking BG regularly. Last BG before BF 177.  Plans to talk to [facility] about ordering Jardiance through them, but unsure when he will be able to get an appointment. Discussed A1c 7.4 while on Jardiance, likely higher off. Recommend starting Actos in meantime, until Jardiance can be resumed.   Plan: Continue current medications; Recommend Actos 15 mg - [place], [place]   GERD    Followed by gastroenterologist  Patient has failed these meds in past: pantoprazole (headaches) Patient is currently controlled on the following medications:   Famotidine 40 mg - 1 tablet twice daily   Assessment:   Adherence:using famotidine at least once daily, some days BID  Symptoms: much improved, usually chest pain/burning sensation  Triggers: spicy foods, reviewed other common triggers and pt reports drinking coffee daily (5-6 16 oz cups/day), recommended reducing coffee intake at initial visit   OTCs: currently drinking aloe vera juice from pharmacy,Tums several days per week   Update 09/20/20: doing well on Pepcid 40 mg BID  Plan: Continue current medications  Vaccines   Reviewed and discussed patient's vaccination history.    Update 09/20/20: Has not had Shingrix - denies having the chicken pox   Immunization History  Administered Date(s) Administered   Fluad Quad(high Dose 65+) 07/29/2019   Influenza Split 08/02/2012   Influenza, High Dose Seasonal PF 07/07/2020   Influenza,inj,Quad PF,6+ Mos 09/17/2013, 09/07/2014, 09/06/2016, 11/07/2017   Influenza-Unspecified 09/28/2015, 07/23/2018   PFIZER SARS-COV-2 Vaccination 12/15/2019, 01/05/2020   Pneumococcal Conjugate-13 10/23/2013   Pneumococcal-Unspecified 10/23/2012   Td 10/23/2000   Tdap 10/23/2012   Plan: Remain up to date on vaccinations   Medication Management  Pharmacy/Benefits: [facility]  Adherence: refills timely, no concerns   Affordability: Jardiance - ineligible for PAP  CCM Follow Up:  3  months, telephone; CMA call for BG log mid-December   [name], PharmD Clinical Pharmacist [facility] at [facility] [phone]

## 2020-09-20 NOTE — Patient Instructions (Signed)
Dear Evan Avery,  Below is a summary of the goals we discussed during our follow up appointment on September 20, 2020. Please contact me anytime with questions or concerns.   Visit Information  Goals Addressed            This Visit's Progress    Pharmacy Care Plan       CARE PLAN ENTRY  Current Barriers:   Chronic Disease Management support, education, and care coordination needs related to Hypertension, Diabetes, and GERD   Hypertension BP Readings from Last 3 Encounters:  09/03/20 (!) 131/56  09/01/20 (!) 172/81  07/16/20 (!) 124/54   Pharmacist Clinical Goal(s): o Over the next 3 months, patient will work with PharmD and providers to maintain BP goal <140/90 mmHg  Current regimen:   Atenolol 25 mg - 1 tablet twice daily  Lisinopril 5 mg - 1 tablet daily  Interventions: o Comprehensive medication review o Reviewed home BP monitoring   Patient self care activities - Over the next 3 months, patient will: o Continue medications as prescribed o Check home BP 3-4 days prior to appointments  o Ensure daily salt intake < 2300 mg/day  Diabetes Lab Results  Component Value Date/Time   HGBA1C 7.4 (A) 07/12/2020 08:02 AM   HGBA1C 8.2 03/12/2020 12:00 AM   HGBA1C 9.2 (H) 01/02/2020 08:35 AM   HGBA1C 8.9 (A) 09/03/2019 01:20 PM   HGBA1C 8.4 02/12/2019 12:00 AM   Home blood glucose monitoring: Fasting 177 (checking occasionally)  Pharmacist Clinical Goal(s): o Over the next 3 months, patient will work with PharmD and providers to achieve A1c goal <7%, fasting blood glucose 80-130, and 2 hours after meals < 180   Current regimen:   Glimepiride 4 mg - 1 tablet daily  Metformin 1000 mg - 1 tablet twice daily   Interventions: o Reviewed home glucose log - recommend resuming BG checks 2-3 days weekly until stable  o Reviewed additional treatment options, including Actos, Ozempic, Januvia. Cost concerns with Ozempic and Januvia. Recommend starting Actos 15 mg  daily.   Patient self care activities - Over the next 3 months, patient will: o Monitor blood sugar 2-3 days per week, before breakfast, document, and provide at future appointment with pharmacist o [facility] appointment for Jardiance 25 mg daily  o Avoid missed doses of medications and take them at the same time each day o Consider keeping a food diary and checking blood glucose 2 hours after meals o Review carbohydrate counting and the diabetes plate method o Contact provider with any episodes of hypoglycemia  Acid Reflux  Pharmacist Clinical Goal(s) o Over the next 3 months, patient will work with PharmD and providers to improve acid reflux symptoms   Current regimen:  o Pepcid 40 mg - 1 tablet twice daily o Tums - as needed  Interventions: o Reviewed triggers, lifestyle changes, and treatment options  Patient self care activities - Over the next 3 months, patient will: o Try cutting back on coffee intake to see if symptoms improve.  o Watch for other possible triggers including high fat meals, chocolate, fried foods, peppermint, carbonated beverages, acidic foods. Quitting smoking can also improve acid reflux symptoms  o Eat small meals and avoid lying down for 2 hours hours after eating  Please see past updates related to this goal by clicking on the "Past Updates" button in the selected goal        The patient verbalized understanding of instructions, educational  materials, and care plan provided today and declined offer to receive copy of patient instructions, educational materials, and care plan.   The pharmacy team will reach out to the patient again over the next 45 days.   [name], PharmD Clinical Pharmacist [facility] at [facility] [phone]

## 2020-09-21 ENCOUNTER — Telehealth: Payer: Self-pay | Admitting: Family Medicine

## 2020-09-21 MED ORDER — PIOGLITAZONE HCL 15 MG PO TABS: 15.0000 mg | ORAL_TABLET | Freq: Every day | ORAL | 0 refills | Status: DC

## 2020-09-21 NOTE — Telephone Encounter (Signed)
Spoke with pt relaying Dr. Synthia Innocent message.  Pt verbalizes understanding and expresses his thanks.  However, he will need to call back to schedule lab visit for UA.

## 2020-09-21 NOTE — Telephone Encounter (Signed)
Received message from [place] about pt not taking jardiance due to cost through end of year.  Will start actos 15mg  in its place.  plz notify pt to take actos until he can restart jardiance. Do prefer he ultimately take jardiance in place of actos however so keep working towards this.  Smoker but no personal or fmhx bladder cancer.  Please have him come in for urinalysis prior to starting actos (indication: smoking).

## 2020-09-21 NOTE — Progress Notes (Signed)
I have collaborated with the care management provider regarding care management and care coordination activities outlined in this encounter and have reviewed this encounter including documentation in the note and care plan. I am certifying that I agree with the content of this note and encounter as supervising physician.  

## 2020-09-23 ENCOUNTER — Other Ambulatory Visit: Payer: Non-veteran care

## 2020-09-23 NOTE — Telephone Encounter (Signed)
Pt returning call.  Scheduled lab visit for tomorrow at 8:15.

## 2020-09-23 NOTE — Telephone Encounter (Signed)
Left message on vm per dpr asking pt to call back to schedule a lab visit for UA.

## 2020-09-24 ENCOUNTER — Other Ambulatory Visit (INDEPENDENT_AMBULATORY_CARE_PROVIDER_SITE_OTHER): Payer: No Typology Code available for payment source

## 2020-09-24 ENCOUNTER — Other Ambulatory Visit: Payer: Self-pay

## 2020-09-24 ENCOUNTER — Ambulatory Visit (INDEPENDENT_AMBULATORY_CARE_PROVIDER_SITE_OTHER): Payer: No Typology Code available for payment source | Admitting: Gastroenterology

## 2020-09-24 ENCOUNTER — Encounter: Payer: Self-pay | Admitting: Gastroenterology

## 2020-09-24 VITALS — BP 124/60 | HR 52 | Ht 67.75 in | Wt 232.5 lb

## 2020-09-24 DIAGNOSIS — K639 Disease of intestine, unspecified: Secondary | ICD-10-CM

## 2020-09-24 DIAGNOSIS — F17200 Nicotine dependence, unspecified, uncomplicated: Secondary | ICD-10-CM

## 2020-09-24 LAB — BASIC METABOLIC PANEL
BUN: 17 mg/dL (ref 6–23)
CO2: 26 mEq/L (ref 19–32)
Calcium: 9.6 mg/dL (ref 8.4–10.5)
Chloride: 104 mEq/L (ref 96–112)
Creatinine, Ser: 1.09 mg/dL (ref 0.40–1.50)
GFR: 66.56 mL/min (ref >60.00)
Glucose, Bld: 245 mg/dL — ABNORMAL HIGH (ref 70–99)
Potassium: 4.3 mEq/L (ref 3.5–5.1)
Sodium: 139 mEq/L (ref 135–145)

## 2020-09-24 LAB — POC URINALSYSI DIPSTICK (AUTOMATED)
Bilirubin, UA: NEGATIVE
Blood, UA: NEGATIVE
Glucose, UA: POSITIVE — AB
Ketones, UA: NEGATIVE
Nitrite, UA: NEGATIVE
Protein, UA: POSITIVE — AB
Spec Grav, UA: 1.02 (ref 1.010–1.025)
Urobilinogen, UA: 0.2 E.U./dL
pH, UA: 6 (ref 5.0–8.0)

## 2020-09-24 NOTE — Progress Notes (Signed)
Review of pertinent gastrointestinal problems: 1.  Complicated diverticulitis requiring sigmoid colocolonic anastomosis remotely 2.  Elevated risk for colon cancer.  Colonoscopy 2017 Dr. Arta Silence, several precancerous polyps were removed.  It seems that he was "lost to follow-up" after that colonoscopy.  09/13/2016 colonoscopy with Dr. [name] with "too many" 3-8 mm polyps in the rectum, diverticulosis in the sigmoid, descending and transverse colon, 2 2-3 mm polyps in the descending colon and transverse colon, a few 5-6 mm polyps in the descending colon and transverse colon and a nodule in the cecum which is a possible mucocele or possible pancreatic rest. Pathology showed sessile serrated polyp, tubular adenomas, polypoid colonic mucosa negative for dysplasia and hyperplastic polyps.  Repeat was then recommended in 2 years.  November 2021 Dr. Ardis Hughs colonoscopy: 4 subcentimeter adenomas removed from the descending, transverse and cecum segments.  There was a soft approximately 15 mm centrally umbilicated "nodule" in the cecum with excessive mucus associated. I felt this was similar although likely larger than the 8 mm lesion described by Dr. [name] at his 2017 colonoscopy.  I took biopsies.  This was proven to be a sessile serrated polyp.  There were also 15-20 soft fleshy small to medium sized sessile polyps in the rectosigmoid which were near the colocolonic anastomosis and then distal.  Those were sampled and were proven to be hyperplastic. 3.  Gastritis, duodenitis noted on EGD November 2021, biopsies showed no H. pylori.  Likely related to daily aspirin and NSAID use.  He was recommended to stop those medicines.  H2 blocker 40 mg famotidine twice daily because proton pump inhibitors caused significant headaches.  HPI: This is a very pleasant 74 year old man who is here with his wife today.  I last saw him at the time of his colonoscopy and upper endoscopy.  See those results summarized  above.  He is on Pepcid 40 mg twice daily.  Proton pump inhibitors cause significant headaches.  He and his wife had several questions about his polyp burden  ROS: complete GI ROS as described in HPI, all other review negative.  Constitutional:  No unintentional weight loss   Past Medical History:  Diagnosis Date  . Agent orange exposure 1968  . Arthritis   . CAD (coronary artery disease)    4 stents in right artery  . Complex tear of medial meniscus of left knee as current injury 07/26/2012   [place]  . Diabetes mellitus    type 2  . Diverticulitis of colon with perforation 05/23/2016  . DVT (deep venous thrombosis) ([place])   . GERD (gastroesophageal reflux disease)   . Glaucoma 2006   on drops, followed by [place]  . Hx of colonic polyps   . Hyperlipidemia   . Hypertension   . Intra-abdominal abscess ([place]) 05/16/2016  . LUMBAR RADICULOPATHY 07/18/2007   Qualifier: Diagnosis of  By: Arnoldo Morale MD, Balinda Quails   . Myocardial infarction [facility])    1992  . Sleep apnea    uses CPAP regularly    Past Surgical History:  Procedure Laterality Date  . APPENDECTOMY  1958  . BOWEL RESECTION N/A 05/23/2016   Procedure: SMALL BOWEL RESECTION;  Surgeon: Jackolyn Confer, MD;  Location: [facility];  Service: General;  Laterality: N/A;  . CARDIAC CATHETERIZATION  08/11/2008   angiographically patent LAD diagonal, circumflex and multiple circumflex obtuse marginal branches from the L coronary artery; stenting of mid R coronary artery with 3.5 x 5mm Liberte non DES postdilated w/ 4.0 DuraStent [name])  .  CARDIOVASCULAR STRESS TEST  06/16/2011   R/P MV - moderate perfusion defect in basal inferoseptal, basal inferior, mid inferseptal and mid inferior regions, consistent with infarct/scar and/or diaphragmatic attenuation; non-gated study secondary to ectopy; no CP or EKG changes for ischemia; abnormal study although no significant changes from previous study; in the absense of gated images cannot calculate EF or  distinguish scar/artifact  . COLONOSCOPY  08/2016   TAx5, sessile serrated polyp, diverticulosis, rpt 2 yrs ([place])  . COLOSTOMY Left 05/23/2016   Procedure: COLOSTOMY;  Surgeon: Jackolyn Confer, MD;  Location: [facility];  Service: General;  Laterality: Left;  . COLOSTOMY TAKEDOWN N/A 11/10/2016   Procedure: LAPAROSCOPIC LYSIS OF ADHESIONS AND  COLOSTOMY CLOSURE;  Surgeon: Jackolyn Confer, MD;  Location: [facility];  Service: General;  Laterality: N/A;  . CYST EXCISION  04/2018   posterior neck ([place])  . DOPPLER ECHOCARDIOGRAPHY  08/20/2008   EF >22%; LV systolic function normal; LV mildly dilated; doppler flow suggestive of impaired LV relaxation; RV mildly dilated, RV systolic function normal, RV systolic pressure normal  . INCISION AND DRAINAGE ABSCESS N/A 05/23/2016   Procedure: DRAINAGE OF MULTIPLE INTRA  ABDOMINAL ABSCESS;  Surgeon: Jackolyn Confer, MD;  [facility];  Service: General;  Laterality: N/A;  . KNEE SURGERY Left 2015   torn meniscus [place] Ip)  . LAPAROSCOPIC LYSIS OF ADHESIONS  11/10/2016   Procedure: LAPAROSCOPIC LYSIS OF ADHESIONS;  Surgeon: Jackolyn Confer, MD;  [facility];  Service: General;;  . LAPAROTOMY N/A 05/23/2016   Procedure: EXPLORATORY LAPAROTOMY;  Surgeon: Jackolyn Confer, MD;  [facility];  Service: General;  Laterality: N/A;  . MASS EXCISION N/A 05/22/2018   Procedure: EXCISION OF CYST ON POSTERIOR NECK ERAS PATHWAY;  Surgeon: Erroll Luna, MD;  Location: [place];  Service: General;  Laterality: N/A;  . PERCUTANEOUS CORONARY STENT INTERVENTION (PCI-S)  1992, 1999, 2004   4 vessels [name])  . PROCTOSCOPY  11/10/2016   Procedure: PROCTOSCOPY;  Surgeon: Jackolyn Confer, MD;  [facility];  Service: General;;    Current Outpatient Medications  Medication Sig Dispense Refill  . atenolol (TENORMIN) 25 MG tablet Take 1 tablet (25 mg total) by mouth 2 (two) times daily. 180 tablet 3  . atorvastatin (LIPITOR) 40 MG tablet TAKE  1 TABLET BY MOUTH ONCE DAILY AT  6PM 90 tablet 0  . Brinzolamide-Brimonidine (SIMBRINZA) 1-0.2 % SUSP Apply 1 drop to eye 3 (three) times daily.     . cetirizine (ZYRTEC) 10 MG tablet Take 10 mg by mouth as needed for allergies.    Marland Kitchen clopidogrel (PLAVIX) 75 MG tablet Take 1 tablet by mouth once daily 90 tablet 3  . Coenzyme Q10 (COQ10) 200 MG CAPS Take 1 capsule by mouth daily.    Marland Kitchen ezetimibe (ZETIA) 10 MG tablet Take 1 tablet by mouth once daily 90 tablet 2  . famotidine (PEPCID) 40 MG tablet Take 1 tablet (40 mg total) by mouth 2 (two) times daily. 60 tablet 11  . fluticasone (FLONASE) 50 MCG/ACT nasal spray Place 2 sprays into both nostrils daily. 16 g 1  . glimepiride (AMARYL) 4 MG tablet Take 1 tablet by mouth once daily with breakfast 90 tablet 1  . latanoprost (XALATAN) 0.005 % ophthalmic solution Place 1 drop into both eyes at bedtime.     Marland Kitchen lisinopril (ZESTRIL) 5 MG tablet Take 1 tablet by mouth once daily 90 tablet 3  . metFORMIN (GLUCOPHAGE) 1000 MG tablet Take 1 tablet (1,000 mg total) by  mouth 2 (two) times daily with a meal. 180 tablet 3  . nitroGLYCERIN (NITROSTAT) 0.4 MG SL tablet Place 1 tablet (0.4 mg total) under the tongue every 5 (five) minutes as needed for chest pain. 30 tablet 3  . omega-3 acid ethyl esters (LOVAZA) 1 g capsule Take 2 capsules by mouth twice daily 360 capsule 3  . senna-docusate (SENOKOT-S) 8.6-50 MG tablet Take 1 tablet by mouth at bedtime as needed for mild constipation. 15 tablet 0  . pioglitazone (ACTOS) 15 MG tablet Take 1 tablet (15 mg total) by mouth daily. (Patient not taking: Reported on 09/24/2020) 90 tablet 0   No current facility-administered medications for this visit.    Allergies as of 09/24/2020 - Review Complete 09/24/2020  Allergen Reaction Noted  . Dilaudid [hydromorphone hcl] Itching 11/07/2016  . Ambien [zolpidem] Other (See Comments) 11/10/2016  . Niacin and related Other (See Comments) 04/13/2013    Family History  Problem  Relation Age of Onset  . Alzheimer's disease Mother   . Heart disease Father   . Heart attack Brother   . Diabetes Brother   . Heart disease Brother   . Heart disease Maternal Grandmother   . Colon cancer Maternal Grandfather   . Diabetes Paternal Grandfather   . Diabetes Son   . Esophageal cancer Neg Hx   . Rectal cancer Neg Hx   . Stomach cancer Neg Hx     Social History   Socioeconomic History  . Marital status: Married    Spouse name: Not on file  . Number of children: Not on file  . Years of education: Not on file  . Highest education level: Not on file  Occupational History  . Occupation: retired  Tobacco Use  . Smoking status: Current Every Day Smoker    Packs/day: 1.00    Years: 50.00    Pack years: 50.00    Types: Cigarettes    Start date: 10/23/1958  . Smokeless tobacco: Never Used  Vaping Use  . Vaping Use: Never used  Substance and Sexual Activity  . Alcohol use: Yes    Alcohol/week: 0.0 standard drinks    Comment: rarely-1 drink every 2-3 months  . Drug use: No  . Sexual activity: Yes  Other Topics Concern  . Not on file  Social History Narrative   Lives with wife and son (with mental issues)   Occupation: retired, was Multimedia programmer for [employer]   Activity: no regular exercise   Diet: good water, fruits/vegetables daily   Social Determinants of Health   Financial Resource Strain: Medium Risk  . Difficulty of Paying Living Expenses: Somewhat hard  Food Insecurity: No Food Insecurity  . Worried About Charity fundraiser in the Last Year: Never true  . Ran Out of Food in the Last Year: Never true  Transportation Needs: No Transportation Needs  . Lack of Transportation (Medical): No  . Lack of Transportation (Non-Medical): No  Physical Activity: Inactive  . Days of Exercise per Week: 0 days  . Minutes of Exercise per Session: 0 min  Stress: No Stress Concern Present  . Feeling of Stress : Not at all  Social Connections:   . Frequency of  Communication with Friends and Family: Not on file  . Frequency of Social Gatherings with Friends and Family: Not on file  . Attends Religious Services: Not on file  . Active Member of Clubs or Organizations: Not on file  . Attends Archivist Meetings: Not on file  .  Marital Status: Not on file  Intimate Partner Violence: Not At Risk  . Fear of Current or Ex-Partner: No  . Emotionally Abused: No  . Physically Abused: No  . Sexually Abused: No     Physical Exam: BP 124/60 (BP Location: Left Arm, Patient Position: Sitting, Cuff Size: Normal)   Pulse (!) 52   Ht 5' 7.75" (1.721 m) Comment: height measured without shoes  Wt 232 lb 8 oz (105.5 kg)   BMI 35.61 kg/m  Constitutional: generally well-appearing Psychiatric: alert and oriented x3 Abdomen: soft, nontender, nondistended, no obvious ascites, no peritoneal signs, normal bowel sounds No peripheral edema noted in lower extremities  Assessment and plan: 74 y.o. male with numerous polyps in his colon, gastritis and duodenitis  First his gastritis and duodenitis are almost certainly from daily NSAID and aspirin use.  I recommended again that he talk with his cardiologist about not resuming aspirin because he is on Plavix every day.  He also knows to never take NSAIDs again.  Instead he will be on Tylenol for his routine aches and pains.  He will be on Pepcid twice daily indefinitely.  Proton pump inhibitors caused significant headaches  We discussed his polyp burden.  I recommended a CT scan of the abdomen pelvis to make sure that the umbilicated mucus containing sessile serrated polyp in his cecum is not the "tip of the iceberg" with a mass underneath.  After seeing that result we will contact him about repeat colonoscopy and my goal will be to remove the cecal polyp completely as well as any of the large hyperplastic sigmoid polyps.  He will have to hold his Plavix for 5 days again.  We will not set that up until after seeing  his CAT scan result.  Please see the "Patient Instructions" section for addition details about the plan.  Owens Loffler, MD [facility] 09/24/2020, 9:16 AM   Total time on date of encounter was 30 minutes (this included time spent preparing to see the patient reviewing records; obtaining and/or reviewing separately obtained history; performing a medically appropriate exam and/or evaluation; counseling and educating the patient and family if present; ordering medications, tests or procedures if applicable; and documenting clinical information in the health record).

## 2020-09-24 NOTE — Patient Instructions (Addendum)
If you are age 75 or older, your body mass index should be between 23-30. Your Body mass index is 35.61 kg/m. If this is out of the aforementioned range listed, please consider follow up with your Primary Care Provider.  If you are age 64 or younger, your body mass index should be between 19-25. Your Body mass index is 35.61 kg/m. If this is out of the aformentioned range listed, please consider follow up with your Primary Care Provider.   Your provider has requested that you go to the basement level for lab work before leaving today. Press "B" on the elevator. The lab is located at the first door on the left as you exit the elevator.  You have been scheduled for a CT scan of the abdomen and pelvis at [facility], 1st floor Radiology. You are scheduled on 09-29-20  at 9:30am. You should arrive 15 minutes prior to your appointment time for registration.  Please pick up 2 bottles of contrast from [facility] at least 3 days prior to your scan. The solution may taste better if refrigerated, but do NOT add ice or any other liquid to this solution. Shake well before drinking.   Please follow the written instructions below on the day of your exam:   1) Do not eat anything after 5:30am (4 hours prior to your test)   2) Drink 1 bottle of contrast @ 7:30am (2 hours prior to your exam)  Remember to shake well before drinking and do NOT pour over ice.     Drink 1 bottle of contrast @ 8:30am (1 hour prior to your exam)   You may take any medications as prescribed with a small amount of water, if necessary. If you take any of the following medications: METFORMIN, GLUCOPHAGE, GLUCOVANCE, AVANDAMET, RIOMET, FORTAMET, [place] MET, JANUMET, GLUMETZA or METAGLIP, you MAY be asked to HOLD this medication 48 hours AFTER the exam.   The purpose of you drinking the oral contrast is to aid in the visualization of your intestinal tract. The contrast solution may cause some diarrhea. Depending on your individual  set of symptoms, you may also receive an intravenous injection of x-ray contrast/dye. Plan on being at [facility] for 45 minutes or longer, depending on the type of exam you are having performed.   If you have any questions regarding your exam or if you need to reschedule, you may call [facility] at [phone] between the hours of 8:00 am and 5:00 pm, Monday-Friday.   Due to recent changes in healthcare laws, you may see the results of your imaging and laboratory studies on MyChart before your provider has had a chance to review them.  We understand that in some cases there may be results that are confusing or concerning to you. Not all laboratory results come back in the same time frame and the provider may be waiting for multiple results in order to interpret others.  Please give [place] 48 hours in order for your provider to thoroughly review all the results before contacting the office for clarification of your results.   You should NEVER use Aspirin or NSAIDS (ibuprofen, naproxen, or celebrex, etc.)  Thank you for entrusting me with your care and choosing [facility].  Dr Ardis Hughs

## 2020-09-24 NOTE — Telephone Encounter (Signed)
Ran UA on sample and documented results.  Also, spun and drew up for UCX, just in case.  FYI to Dr. Darnell Level.

## 2020-09-27 NOTE — Telephone Encounter (Signed)
See "results notes" section 

## 2020-09-29 ENCOUNTER — Other Ambulatory Visit: Payer: Self-pay

## 2020-09-29 ENCOUNTER — Encounter (HOSPITAL_COMMUNITY): Payer: Self-pay

## 2020-09-29 ENCOUNTER — Ambulatory Visit (HOSPITAL_COMMUNITY)
Admission: RE | Admit: 2020-09-29 | Discharge: 2020-09-29 | Disposition: A | Discharge status: Home / Self Care | Payer: No Typology Code available for payment source | Source: Ambulatory Visit | Attending: Gastroenterology | Admitting: Gastroenterology

## 2020-09-29 DIAGNOSIS — D3502 Benign neoplasm of left adrenal gland: Secondary | ICD-10-CM | POA: Diagnosis not present

## 2020-09-29 DIAGNOSIS — K639 Disease of intestine, unspecified: Secondary | ICD-10-CM | POA: Diagnosis not present

## 2020-09-29 DIAGNOSIS — K8020 Calculus of gallbladder without cholecystitis without obstruction: Secondary | ICD-10-CM | POA: Diagnosis not present

## 2020-09-29 MED ORDER — IOHEXOL 300 MG/ML  SOLN
100.0000 mL | Freq: Once | INTRAMUSCULAR | Status: AC | PRN
  Administered 2020-09-29: 100 mL via INTRAVENOUS

## 2020-10-04 ENCOUNTER — Other Ambulatory Visit: Payer: Self-pay

## 2020-10-04 ENCOUNTER — Telehealth: Payer: Self-pay

## 2020-10-04 DIAGNOSIS — Z86010 Personal history of colonic polyps: Secondary | ICD-10-CM

## 2020-10-04 DIAGNOSIS — K639 Disease of intestine, unspecified: Secondary | ICD-10-CM

## 2020-10-04 NOTE — Telephone Encounter (Signed)
-----   Message from Milus Banister, MD sent at 10/04/2020  7:46 AM EST -----  Eve Rey, please call him.  The CT scan shows no sign of anything more extensive involving his colon.  Let him know that this is not the tip of the iceberg.  He needs a colonoscopy at [facility] long, schedule for a full hour given his polyp burden.  He will need to hold Plavix 5 days prior and his cardiologist can hopefully okay that.  Thank you

## 2020-10-04 NOTE — Telephone Encounter (Signed)
[facility] Pre-operative Risk Assessment     Request for surgical clearance:     Endoscopy Procedure  What type of surgery is being performed?     Colon    When is this surgery scheduled?     11/11/20  What type of clearance is required ?   Pharmacy  Are there any medications that need to be held prior to surgery and how long? Plavix   Practice name and name of physician performing surgery?      [facility]  What is your office phone and fax number?      Phone- [phone]  [phone]  Anesthesia type (None, local, MAC, general) ?       MAC

## 2020-10-04 NOTE — Chronic Care Management (AMB) (Signed)
Chronic Care Management Pharmacy Assistant   Name: Evan Avery  MRN: [id number] DOB: [birth year]  Reason for Encounter: Disease State  Patient Questions:  1.  Have you seen any other providers since your last visit? Yes 09/24/20 - [facility]off[facility] - Gastroenterology   2.  Any changes in your medicines or health? No  PCP : [name], MD  Allergies:   Allergies  Allergen Reactions  . Dilaudid [Hydromorphone Hcl] Itching  . Ambien [Zolpidem] Other (See Comments)    Pt states "makes me wild"  . Niacin And Related Other (See Comments)    Extreme fatigue    Medications: Outpatient Encounter Medications as of 10/04/2020  Medication Sig Note  . atenolol (TENORMIN) 25 MG tablet Take 1 tablet (25 mg total) by mouth 2 (two) times daily.   Marland Kitchen atorvastatin (LIPITOR) 40 MG tablet TAKE 1 TABLET BY MOUTH ONCE DAILY AT  6PM   . Brinzolamide-Brimonidine (SIMBRINZA) 1-0.2 % SUSP Apply 1 drop to eye 3 (three) times daily.    . cetirizine (ZYRTEC) 10 MG tablet Take 10 mg by mouth as needed for allergies.   Marland Kitchen clopidogrel (PLAVIX) 75 MG tablet Take 1 tablet by mouth once daily   . Coenzyme Q10 (COQ10) 200 MG CAPS Take 1 capsule by mouth daily.   Marland Kitchen ezetimibe (ZETIA) 10 MG tablet Take 1 tablet by mouth once daily   . famotidine (PEPCID) 40 MG tablet Take 1 tablet (40 mg total) by mouth 2 (two) times daily.   . fluticasone (FLONASE) 50 MCG/ACT nasal spray Place 2 sprays into both nostrils daily.   Marland Kitchen glimepiride (AMARYL) 4 MG tablet Take 1 tablet by mouth once daily with breakfast   . latanoprost (XALATAN) 0.005 % ophthalmic solution Place 1 drop into both eyes at bedtime.    Marland Kitchen lisinopril (ZESTRIL) 5 MG tablet Take 1 tablet by mouth once daily   . metFORMIN (GLUCOPHAGE) 1000 MG tablet Take 1 tablet (1,000 mg total) by mouth 2 (two) times daily with a meal.   . nitroGLYCERIN (NITROSTAT) 0.4 MG SL tablet Place 1 tablet (0.4 mg total) under the tongue every 5 (five) minutes as needed for chest  pain. 07/16/2020: On hand  . omega-3 acid ethyl esters (LOVAZA) 1 g capsule Take 2 capsules by mouth twice daily   . pioglitazone (ACTOS) 15 MG tablet Take 1 tablet (15 mg total) by mouth daily. (Patient not taking: Reported on 09/24/2020)   . senna-docusate (SENOKOT-S) 8.6-50 MG tablet Take 1 tablet by mouth at bedtime as needed for mild constipation.    No facility-administered encounter medications on file as of 10/04/2020.    Current Diagnosis: Patient Active Problem List   Diagnosis Date Noted  . Bradycardia 04/10/2020  . Type 2 diabetes mellitus with diabetic nephropathy, without long-term current use of insulin ([place]) 04/10/2020  . Headache 04/09/2020  . 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 Atypical nevus 01/11/2020  . GERD (gastroesophageal reflux disease) 09/03/2019  . Adenoma of left adrenal gland 08/25/2019  . Thoracic aorta atherosclerosis ([place]) 05/18/2019  . COPD (chronic obstructive pulmonary disease) ([place]) 05/18/2019  . Fatty liver 03/30/2018  . Gallstone 03/30/2018  . Acute bacterial sinusitis 12/18/2017  . Osteoarthritis 03/08/2017  . Epidermal cyst of neck 03/06/2017  . Glaucoma   . Health maintenance examination 10/07/2014  . Medicare annual wellness visit, subsequent 10/07/2014  . Advanced care planning/counseling discussion 10/07/2014  . Smoker 10/07/2014  . Bilateral foot pain 09/07/2014  . CAD in native  artery 04/17/2014  . Hyperlipidemia with target LDL less than 70 10/08/2013  . Essential hypertension 10/08/2013  . Obesity, Class I, BMI 30.0-34.9 (see actual BMI) 10/08/2013  . Complex tear of medial meniscus of left knee as current injury 07/26/2012  . OTHER ATOPIC DERMATITIS AND RELATED CONDITIONS 06/24/2010  . LUMBAR RADICULOPATHY 07/18/2007  . Diabetes mellitus type 2, uncontrolled, with complications ([place]) 73/42/8768  . HIP PAIN, BILATERAL 07/05/2007  . OSA on CPAP 05/07/2007  . Agent orange exposure 10/23/1966   Recent Relevant  Labs: Lab Results  Component Value Date/Time   HGBA1C 7.4 (A) 07/12/2020 08:02 AM   HGBA1C 8.2 03/12/2020 12:00 AM   HGBA1C 9.2 (H) 01/02/2020 08:35 AM   HGBA1C 8.9 (A) 09/03/2019 01:20 PM   HGBA1C 8.4 02/12/2019 12:00 AM   MICROALBUR 0.2 12/27/2007 10:00 AM    Kidney Function Lab Results  Component Value Date/Time   CREATININE 1.09 09/24/2020 10:02 AM   CREATININE 1.2 03/12/2020 12:00 AM   CREATININE 1.05 01/02/2020 08:35 AM   CREATININE 0.96 03/13/2016 08:03 AM   GFR 66.56 09/24/2020 10:02 AM   GFRNONAA >60 05/15/2018 03:27 PM   GFRAA >60 05/15/2018 03:27 PM   Patient contacted 10/04/20 regarding his diabetes and pioglitazone prescription. He stated that he has been taking the medication but has not checked his BGL in a while. Patient advised this was needed and I would call him back next Monday 10/11/20 to review readings.  . Current antihyperglycemic regimen:   Glimepiride 4 mg - 1 tablet daily  Metformin 1000 mg - 1 tablet twice daily    . What recent interventions/DTPs have been made to improve glycemic control:  o Started Actos 15 mg, keeping BG log.   . Have there been any recent hospitalizations or ED visits since last visit with CPP? No   . Patient denies hypoglycemic symptoms, including Pale, Sweaty, Shaky, Hungry, Nervous/irritable and Vision changes   . Patient denies hyperglycemic symptoms, including blurry vision, excessive thirst, fatigue, polyuria and weakness   How often are you checking your blood sugar?  Infrequently. Will follow up with patient next week.   . What are your blood sugars ranging? Will update 10/11/20 when patient is contacted again o Fasting:  o Before meals:  o After meals:  o Bedtime:   . During the week, how often does your blood glucose drop below 70? Never   . Are you checking your feet daily/regularly? Yes, denies any sores, redness or blisters.  Adherence Review: Is the patient currently on a STATIN medication? Yes Is the  patient currently on ACE/ARB medication? Yes Does the patient have >5 day gap between last estimated fill dates? No  Will follow up with patient on 10/11/20   Follow-Up:  Pharmacist Review   [name], CPP notified  Margaretmary Dys, [place] Assistant [phone]

## 2020-10-05 NOTE — Telephone Encounter (Signed)
Dr. [name] This patient has had multiple PCIs to his RCA, most recent in 1999. Nuclear stress test 05/11/20 without reversible ischemia. May he hold plavix 5-7 days for colonoscopy?

## 2020-10-07 NOTE — Telephone Encounter (Signed)
Ok to hold plavix 

## 2020-10-08 NOTE — Telephone Encounter (Signed)
The pt has been advised and will hold plavix as recommended

## 2020-10-11 ENCOUNTER — Telehealth: Payer: Self-pay

## 2020-10-11 NOTE — Telephone Encounter (Signed)
   Primary Cardiologist: Shelva Majestic, MD  Medications reviewed as part of pre-operative protocol coverage.NOEH SPARACINO may hold his Plavix for 5 to 7 days prior to his colonoscopy. Please resume as soon as hemostasis is achieved.  I will route this recommendation to the requesting party via Epic fax function and remove from pre-op pool.  Please call with questions.  Jossie Ng. Parsa Rickett NP-C    10/11/2020, [address] Office [phone] Fax [phone]

## 2020-10-11 NOTE — Chronic Care Management (AMB) (Signed)
Chronic Care Management Pharmacy Assistant   Name: Evan Avery  MRN: [id number] DOB: July 16, 1946  Reason for Encounter: Disease State   Patient Questions:             1.  Have you seen any other providers since your last visit?Yes 09/24/20 - [facility] - Gastroenterology              2.  Any changes in your medicines or health?No   PCP : [name], MD  Allergies:   Allergies  Allergen Reactions  . Dilaudid [Hydromorphone Hcl] Itching  . Ambien [Zolpidem] Other (See Comments)    Pt states "makes me wild"  . Niacin And Related Other (See Comments)    Extreme fatigue    Medications: Outpatient Encounter Medications as of 10/11/2020  Medication Sig Note  . atenolol (TENORMIN) 25 MG tablet Take 1 tablet (25 mg total) by mouth 2 (two) times daily.   Marland Kitchen atorvastatin (LIPITOR) 40 MG tablet TAKE 1 TABLET BY MOUTH ONCE DAILY AT  6PM   . Brinzolamide-Brimonidine (SIMBRINZA) 1-0.2 % SUSP Apply 1 drop to eye 3 (three) times daily.    . cetirizine (ZYRTEC) 10 MG tablet Take 10 mg by mouth as needed for allergies.   Marland Kitchen clopidogrel (PLAVIX) 75 MG tablet Take 1 tablet by mouth once daily   . Coenzyme Q10 (COQ10) 200 MG CAPS Take 1 capsule by mouth daily.   Marland Kitchen ezetimibe (ZETIA) 10 MG tablet Take 1 tablet by mouth once daily   . famotidine (PEPCID) 40 MG tablet Take 1 tablet (40 mg total) by mouth 2 (two) times daily.   . fluticasone (FLONASE) 50 MCG/ACT nasal spray Place 2 sprays into both nostrils daily.   Marland Kitchen glimepiride (AMARYL) 4 MG tablet Take 1 tablet by mouth once daily with breakfast   . latanoprost (XALATAN) 0.005 % ophthalmic solution Place 1 drop into both eyes at bedtime.    Marland Kitchen lisinopril (ZESTRIL) 5 MG tablet Take 1 tablet by mouth once daily   . metFORMIN (GLUCOPHAGE) 1000 MG tablet Take 1 tablet (1,000 mg total) by mouth 2 (two) times daily with a meal.   . nitroGLYCERIN (NITROSTAT) 0.4 MG SL tablet Place 1 tablet (0.4 mg total) under the tongue every 5 (five)  minutes as needed for chest pain. 07/16/2020: On hand  . omega-3 acid ethyl esters (LOVAZA) 1 g capsule Take 2 capsules by mouth twice daily   . pioglitazone (ACTOS) 15 MG tablet Take 1 tablet (15 mg total) by mouth daily. (Patient not taking: Reported on 09/24/2020)   . senna-docusate (SENOKOT-S) 8.6-50 MG tablet Take 1 tablet by mouth at bedtime as needed for mild constipation.    No facility-administered encounter medications on file as of 10/11/2020.    Current Diagnosis: Patient Active Problem List   Diagnosis Date Noted  . Bradycardia 04/10/2020  . Type 2 diabetes mellitus with diabetic nephropathy, without long-term current use of insulin ([place]) 04/10/2020  . Headache 04/09/2020  . Stenosis of right subclavian artery ([place]) 02/08/2020  . Atypical nevus 01/11/2020  . GERD (gastroesophageal reflux disease) 09/03/2019  . Adenoma of left adrenal gland 08/25/2019  . Thoracic aorta atherosclerosis ([place]) 05/18/2019  . COPD (chronic obstructive pulmonary disease) ([place]) 05/18/2019  . Fatty liver 03/30/2018  . Gallstone 03/30/2018  . Acute bacterial sinusitis 12/18/2017  . Osteoarthritis 03/08/2017  . Epidermal cyst of neck 03/06/2017  . Glaucoma   . Health maintenance examination 10/07/2014  . Medicare annual wellness visit, subsequent 10/07/2014  .  Advanced care planning/counseling discussion 10/07/2014  . Smoker 10/07/2014  . Bilateral foot pain 09/07/2014  . CAD in native artery 04/17/2014  . Hyperlipidemia with target LDL less than 70 10/08/2013  . Essential hypertension 10/08/2013  . Obesity, Class I, BMI 30.0-34.9 (see actual BMI) 10/08/2013  . Complex tear of medial meniscus of left knee as current injury 07/26/2012  . OTHER ATOPIC DERMATITIS AND RELATED CONDITIONS 06/24/2010  . LUMBAR RADICULOPATHY 07/18/2007  . Diabetes mellitus type 2, uncontrolled, with complications ([place]) 28/31/5176  . HIP PAIN, BILATERAL 07/05/2007  . OSA on CPAP 05/07/2007  . Agent orange exposure  10/23/1966      Current antihyperglycemic regimen:   Glimepiride 4 mg - 1 tablet daily  Metformin 1000 mg - 1 tablet twice daily   Actos 15 mg    What recent interventions/DTPs have been made to improve glycemic control:  ? Started Actos 15 mg, keeping BG log.    Have there been any recent hospitalizations or ED visits since last visit with CPP? No    Patient denies hypoglycemic symptoms, including Pale, Sweaty, Shaky, Hungry, Nervous/irritable and Vision changes    Patient denies hyperglycemic symptoms, including blurry vision, excessive thirst, fatigue, polyuria and weakness   How often are you checking your blood sugar?  Once a day   What are your blood sugars ranging? ? Fasting:   10/05/20- 266  10/06/20- 184  10/07/20- 322 - states he at chocolate covered cashews night before  10/08/20- 227  10/09/20- 323- Had chicken alfredo for dinner night before  10/10/20- 226  10/11/20- 241  ? Before meals: N/A ? After meals: N/A ? Bedtime: N/A   During the week, how often does your blood glucose drop below 70? Never    Are you checking your feet daily/regularly? Yes, denies any sores, redness or blisters.  Adherence Review: Is the patient currently on a STATIN medication? Yes Is the patient currently on ACE/ARB medication? Yes Does the patient have >5 day gap between last estimated fill dates? No  Patient states he is doing well on his Actos. Denies any ill effects from taking it.   Follow-Up:  Pharmacist Review   [name], CPP notified  Evan Avery, [place] Pharmacy Assistant [phone]

## 2020-10-21 ENCOUNTER — Telehealth: Payer: Self-pay

## 2020-10-21 NOTE — Chronic Care Management (AMB) (Addendum)
Chronic Care Management Pharmacy Assistant   Name: Evan Avery  MRN: [id number] DOB: Jan 08, 1946  Reason for Encounter: Medication Review- Adherence review  Reviewed chart and adherence measures. Per [employer], medication adherence for Cholesterol 90-99% med compliance. Medication adherence for Hypertension 80-89% med compliance.  Medication adherence for diabetes 100% med compliance.    PCP : Eustaquio Boyden, MD  Allergies:   Allergies  Allergen Reactions   Dilaudid [Hydromorphone Hcl] Itching   Ambien [Zolpidem] Other (See Comments)    Pt states "makes me wild"   Niacin And Related Other (See Comments)    Extreme fatigue    Medications: Outpatient Encounter Medications as of 10/21/2020  Medication Sig Note   atenolol (TENORMIN) 25 MG tablet Take 1 tablet (25 mg total) by mouth 2 (two) times daily.    atorvastatin (LIPITOR) 40 MG tablet TAKE 1 TABLET BY MOUTH ONCE DAILY AT  6PM    Brinzolamide-Brimonidine (SIMBRINZA) 1-0.2 % SUSP Apply 1 drop to eye 3 (three) times daily.     cetirizine (ZYRTEC) 10 MG tablet Take 10 mg by mouth as needed for allergies.    clopidogrel (PLAVIX) 75 MG tablet Take 1 tablet by mouth once daily    Coenzyme Q10 (COQ10) 200 MG CAPS Take 1 capsule by mouth daily.    ezetimibe (ZETIA) 10 MG tablet Take 1 tablet by mouth once daily    famotidine (PEPCID) 40 MG tablet Take 1 tablet (40 mg total) by mouth 2 (two) times daily.    fluticasone (FLONASE) 50 MCG/ACT nasal spray Place 2 sprays into both nostrils daily.    glimepiride (AMARYL) 4 MG tablet Take 1 tablet by mouth once daily with breakfast    latanoprost (XALATAN) 0.005 % ophthalmic solution Place 1 drop into both eyes at bedtime.     lisinopril (ZESTRIL) 5 MG tablet Take 1 tablet by mouth once daily    metFORMIN (GLUCOPHAGE) 1000 MG tablet Take 1 tablet (1,000 mg total) by mouth 2 (two) times daily with a meal.    nitroGLYCERIN (NITROSTAT) 0.4 MG SL tablet Place 1 tablet (0.4 mg  total) under the tongue every 5 (five) minutes as needed for chest pain. 07/16/2020: On hand   omega-3 acid ethyl esters (LOVAZA) 1 g capsule Take 2 capsules by mouth twice daily    pioglitazone (ACTOS) 15 MG tablet Take 1 tablet (15 mg total) by mouth daily. (Patient not taking: Reported on 09/24/2020)    senna-docusate (SENOKOT-S) 8.6-50 MG tablet Take 1 tablet by mouth at bedtime as needed for mild constipation.    No facility-administered encounter medications on file as of 10/21/2020.    Current Diagnosis: Patient Active Problem List   Diagnosis Date Noted   Bradycardia 04/10/2020   Type 2 diabetes mellitus with diabetic nephropathy, without long-term current use of insulin ([facility]) 04/10/2020   Headache 04/09/2020   Stenosis of right subclavian artery ([facility]) 02/08/2020   Atypical nevus 01/11/2020   GERD (gastroesophageal reflux disease) 09/03/2019   Adenoma of left adrenal gland 08/25/2019   Thoracic aorta atherosclerosis ([facility]) 05/18/2019   COPD (chronic obstructive pulmonary disease) ([facility]) 05/18/2019   Fatty liver 03/30/2018   Gallstone 03/30/2018   Acute bacterial sinusitis 12/18/2017   Osteoarthritis 03/08/2017   Epidermal cyst of neck 03/06/2017   Glaucoma    Health maintenance examination 10/07/2014   Medicare annual wellness visit, subsequent 10/07/2014   Advanced care planning/counseling discussion 10/07/2014   Smoker 10/07/2014   Bilateral foot pain 09/07/2014   CAD in native  artery 04/17/2014   Hyperlipidemia with target LDL less than 70 10/08/2013   Essential hypertension 10/08/2013   Obesity, Class I, BMI 30.0-34.9 (see actual BMI) 10/08/2013   Complex tear of medial meniscus of left knee as current injury 07/26/2012   OTHER ATOPIC DERMATITIS AND RELATED CONDITIONS 06/24/2010   LUMBAR RADICULOPATHY 07/18/2007   Diabetes mellitus type 2, uncontrolled, with complications ([place]) [id number]   HIP PAIN, BILATERAL 07/05/2007   OSA on CPAP 05/07/2007   Agent orange  exposure 10/23/1966    Follow-Up:  Pharmacist Review   Margaretmary Dys, [place] Assistant [phone]  Reviewed chart and insurance data for medication adherence. Patient does not have any gaps in adherence and is not in danger of failing any Medicare adherence measures. No further action required.  [name], PharmD Clinical Pharmacist [facility] [phone]

## 2020-10-23 HISTORY — PX: COLONOSCOPY: SHX174

## 2020-10-28 ENCOUNTER — Telehealth: Payer: Self-pay | Admitting: Gastroenterology

## 2020-10-28 NOTE — Telephone Encounter (Signed)
Inbound call from patient requesting a call back please.  Wants to know why he needs to have procedure done scheduled on 11/11/20 and why at the hospital since he already had procedures done on 08/2020.

## 2020-10-28 NOTE — Telephone Encounter (Signed)
The pt had questions about why he needed to have another colon at this time.  We discussed Dr Christella Hartigan recommendations (see below).  The pt verbalized understanding and agrees to keep the appt as planned. He was told to call back if he has any further concerns.     The CT scan shows no sign of anything more extensive involving his colon. Let him know that this is not the tip of the iceberg. He needs a colonoscopy at [facility] long, schedule for a full hour given his polyp burden. He will need to hold Plavix 5 days prior and his cardiologist can hopefully okay that. Thank you

## 2020-11-08 ENCOUNTER — Other Ambulatory Visit (HOSPITAL_COMMUNITY): Payer: Non-veteran care

## 2020-11-09 ENCOUNTER — Other Ambulatory Visit (HOSPITAL_COMMUNITY)
Admission: RE | Admit: 2020-11-09 | Discharge: 2020-11-09 | Disposition: A | Discharge status: Home / Self Care | Payer: No Typology Code available for payment source | Source: Ambulatory Visit | Attending: Gastroenterology | Admitting: Gastroenterology

## 2020-11-09 DIAGNOSIS — Z20822 Contact with and (suspected) exposure to covid-19: Secondary | ICD-10-CM | POA: Insufficient documentation

## 2020-11-09 DIAGNOSIS — Z01812 Encounter for preprocedural laboratory examination: Secondary | ICD-10-CM | POA: Diagnosis present

## 2020-11-09 LAB — SARS CORONAVIRUS 2 (TAT 6-24 HRS): SARS Coronavirus 2: NEGATIVE

## 2020-11-10 ENCOUNTER — Telehealth: Payer: Self-pay | Admitting: Gastroenterology

## 2020-11-10 NOTE — Telephone Encounter (Signed)
Inbound call from patient requesting a call back from the nurse please.  Wants to know which medications if any he needs not take before his procedure tomorrow.

## 2020-11-10 NOTE — Telephone Encounter (Signed)
The pt has been advised that the only med he should have stopped is his blood thinner.  He did confirm that he stopped the plavix on 1/15.  He will call with any further questions

## 2020-11-11 ENCOUNTER — Other Ambulatory Visit: Payer: Self-pay

## 2020-11-11 ENCOUNTER — Encounter (HOSPITAL_COMMUNITY)
Admission: RE | Disposition: A | Discharge status: Home / Self Care | Payer: Self-pay | Source: Home / Self Care | Attending: Gastroenterology

## 2020-11-11 ENCOUNTER — Ambulatory Visit (HOSPITAL_COMMUNITY)
Admission: RE | Admit: 2020-11-11 | Discharge: 2020-11-11 | Disposition: A | Discharge status: Home / Self Care | Payer: No Typology Code available for payment source | Attending: Gastroenterology | Admitting: Gastroenterology

## 2020-11-11 ENCOUNTER — Ambulatory Visit (HOSPITAL_COMMUNITY): Payer: No Typology Code available for payment source | Admitting: Anesthesiology

## 2020-11-11 ENCOUNTER — Encounter (HOSPITAL_COMMUNITY): Payer: Self-pay | Admitting: Gastroenterology

## 2020-11-11 DIAGNOSIS — K635 Polyp of colon: Secondary | ICD-10-CM | POA: Diagnosis not present

## 2020-11-11 DIAGNOSIS — Z888 Allergy status to other drugs, medicaments and biological substances status: Secondary | ICD-10-CM | POA: Diagnosis not present

## 2020-11-11 DIAGNOSIS — Z800 Family history of malignant neoplasm of digestive organs: Secondary | ICD-10-CM | POA: Insufficient documentation

## 2020-11-11 DIAGNOSIS — I2510 Atherosclerotic heart disease of native coronary artery without angina pectoris: Secondary | ICD-10-CM | POA: Insufficient documentation

## 2020-11-11 DIAGNOSIS — Z8249 Family history of ischemic heart disease and other diseases of the circulatory system: Secondary | ICD-10-CM | POA: Insufficient documentation

## 2020-11-11 DIAGNOSIS — Z820 Family history of epilepsy and other diseases of the nervous system: Secondary | ICD-10-CM | POA: Insufficient documentation

## 2020-11-11 DIAGNOSIS — Z833 Family history of diabetes mellitus: Secondary | ICD-10-CM | POA: Insufficient documentation

## 2020-11-11 DIAGNOSIS — I252 Old myocardial infarction: Secondary | ICD-10-CM | POA: Diagnosis not present

## 2020-11-11 DIAGNOSIS — Z86010 Personal history of colonic polyps: Secondary | ICD-10-CM | POA: Insufficient documentation

## 2020-11-11 DIAGNOSIS — K639 Disease of intestine, unspecified: Secondary | ICD-10-CM

## 2020-11-11 DIAGNOSIS — Z980 Intestinal bypass and anastomosis status: Secondary | ICD-10-CM | POA: Diagnosis not present

## 2020-11-11 DIAGNOSIS — D120 Benign neoplasm of cecum: Secondary | ICD-10-CM | POA: Insufficient documentation

## 2020-11-11 DIAGNOSIS — Z7902 Long term (current) use of antithrombotics/antiplatelets: Secondary | ICD-10-CM | POA: Insufficient documentation

## 2020-11-11 DIAGNOSIS — Z86718 Personal history of other venous thrombosis and embolism: Secondary | ICD-10-CM | POA: Insufficient documentation

## 2020-11-11 DIAGNOSIS — K219 Gastro-esophageal reflux disease without esophagitis: Secondary | ICD-10-CM | POA: Diagnosis not present

## 2020-11-11 DIAGNOSIS — Z955 Presence of coronary angioplasty implant and graft: Secondary | ICD-10-CM | POA: Insufficient documentation

## 2020-11-11 DIAGNOSIS — Z1211 Encounter for screening for malignant neoplasm of colon: Secondary | ICD-10-CM | POA: Diagnosis present

## 2020-11-11 HISTORY — PX: SUBMUCOSAL LIFTING INJECTION: SHX6855

## 2020-11-11 HISTORY — PX: POLYPECTOMY: SHX5525

## 2020-11-11 HISTORY — PX: COLONOSCOPY WITH PROPOFOL: SHX5780

## 2020-11-11 LAB — GLUCOSE, CAPILLARY: Glucose-Capillary: 200 mg/dL — ABNORMAL HIGH (ref 70–99)

## 2020-11-11 SURGERY — COLONOSCOPY WITH PROPOFOL
Anesthesia: Monitor Anesthesia Care

## 2020-11-11 MED ORDER — PROPOFOL 500 MG/50ML IV EMUL
INTRAVENOUS | Status: AC
  Filled 2020-11-11: qty 50

## 2020-11-11 MED ORDER — LIDOCAINE HCL 1 % IJ SOLN
INTRAMUSCULAR | Status: DC | PRN
  Administered 2020-11-11: 50 mg via INTRADERMAL

## 2020-11-11 MED ORDER — PROPOFOL 500 MG/50ML IV EMUL
INTRAVENOUS | Status: DC | PRN
  Administered 2020-11-11: 125 ug/kg/min via INTRAVENOUS

## 2020-11-11 MED ORDER — EPHEDRINE SULFATE 50 MG/ML IJ SOLN
INTRAMUSCULAR | Status: DC | PRN
  Administered 2020-11-11 (×2): 5 mg via INTRAVENOUS

## 2020-11-11 MED ORDER — PROPOFOL 10 MG/ML IV BOLUS
INTRAVENOUS | Status: DC | PRN
  Administered 2020-11-11: 10 mg via INTRAVENOUS

## 2020-11-11 MED ORDER — PROPOFOL 500 MG/50ML IV EMUL: INTRAVENOUS | Status: DC | PRN

## 2020-11-11 MED ORDER — LACTATED RINGERS IV SOLN
INTRAVENOUS | Status: DC
  Administered 2020-11-11: 1000 mL via INTRAVENOUS

## 2020-11-11 MED ORDER — SODIUM CHLORIDE 0.9 % IV SOLN: INTRAVENOUS | Status: DC

## 2020-11-11 MED ORDER — PROPOFOL 10 MG/ML IV BOLUS
INTRAVENOUS | Status: AC
  Filled 2020-11-11: qty 20

## 2020-11-11 MED ORDER — GLYCOPYRROLATE 0.2 MG/ML IJ SOLN
INTRAMUSCULAR | Status: DC | PRN
  Administered 2020-11-11 (×2): .1 mg via INTRAVENOUS

## 2020-11-11 SURGICAL SUPPLY — 21 items

## 2020-11-11 NOTE — Op Note (Signed)
[facility] Patient Name: Evan Avery Procedure Date: 11/11/2020 MRN: [id number] Attending MD: Milus Banister , MD Date of Birth: 11-23-1945 CSN: [id number] Age: 75 Admit Type: Outpatient Procedure:                Colonoscopy Indications:              High risk colon cancer surveillance: Personal                            history of colonic polyps1. Complicated                            diverticulitis requiring sigmoid colocolonic                            anastomosis remotely. 2. Elevated risk for colon                            cancer. 09/13/2016 colonoscopy with Dr. [name] with                            "too many" 3-8 mm polyps in the rectum,                            diverticulosis in the sigmoid, descending and                            transverse colon, 2 2-3 mm polyps in the descending                            colon and transverse colon, a few 5-6 mm polyps in                            the descending colon and transverse colon and a                            nodule in the cecum which is a possible mucocele or                            possible pancreatic rest. Pathology showed sessile                            serrated polyp, tubular adenomas, polypoid colonic                            mucosa negative for dysplasia and hyperplastic                            polyps. November 2021 Dr. Ardis Hughs colonoscopy: 4                            subcentimeter adenomas removed from the descending,  transverse and cecum segments. There was a soft                            approximately 15 mm centrally umbilicated "nodule"                            in the cecum with excessive mucus associated. I                            felt this was similar although likely larger than                            the 8 mm lesion described by Dr. [name] at his 2017                            colonoscopy, biopsied. This was proven to be a                             sessile serrated polyp. There were also several                            soft fleshy small to medium sized sessile polyps in                            the rectosigmoid which were near the colocolonic                            anastomosis and then distal. Those were sampled and                            were proven to be hyperplastic Providers:                Milus Banister, MD, Cleda Daub, RN, Benetta Spar, Technician, Karis Juba, CRNA Referring MD:              Medicines:                Monitored Anesthesia Care Complications:            No immediate complications. Estimated blood loss:                            None. Estimated Blood Loss:     Estimated blood loss: none. Procedure:                Pre-Anesthesia Assessment:                           - Prior to the procedure, a History and Physical                            was performed, and patient medications and  allergies were reviewed. The patient's tolerance of                            previous anesthesia was also reviewed. The risks                            and benefits of the procedure and the sedation                            options and risks were discussed with the patient.                            All questions were answered, and informed consent                            was obtained. Prior Anticoagulants: The patient has                            taken Plavix (clopidogrel), last dose was 5 days                            prior to procedure. ASA Grade Assessment: III - A                            patient with severe systemic disease. After                            reviewing the risks and benefits, the patient was                            deemed in satisfactory condition to undergo the                            procedure.                           After obtaining informed consent, the colonoscope                             was passed under direct vision. Throughout the                            procedure, the patient's blood pressure, pulse, and                            oxygen saturations were monitored continuously. The                            CF-HQ190L ([id number] Olympus colonoscope was                            introduced through the anus and advanced to the the  cecum, identified by appendiceal orifice and                            ileocecal valve. The colonoscopy was performed                            without difficulty. The patient tolerated the                            procedure well. The quality of the bowel                            preparation was good. The ileocecal valve,                            appendiceal orifice, and rectum were photographed. Scope In: 7:43:48 AM Scope Out: 8:14:07 AM Scope Withdrawal Time: 0 hours 22 minutes 0 seconds  Total Procedure Duration: 0 hours 30 minutes 19 seconds  Findings:      The previously noted mucous capped, centrally umbilicated 2.9FA cecal       polyp was easliy located. I used a submucosal injection of Orise to life       the polyp without concerning signs and then resected the polyp with       snare/cautery, apparently completly      The left colon, unusual appearing colo-colonic anstomosis was unchanged.       Jar 1.      Distal to the anastomosis there were several soft fleshy polyps (10-15).       The polyps were 3 to 15 mm in size. These polyps were previously sampled       and proven to be hyperplastic. I removed the largest 5 of these polyps       with cold snare and sent to pathology. Jar 2.      The exam was otherwise without abnormality on direct and retroflexion       views. Impression:               - The previously noted mucous capped, centrally                            umbilicated 2.1HY cecal polyp was easliy located. I                            used a submucosal injection of Orise to life the                             polyp without concerning signs and then resected                            the polyp with snare/cautery, apparently completly                           - The left colon, unusual appearing colo-colonic                            anstomosis was unchanged. Jar 1.                           -  Distal to the anastomosis there were several soft                            fleshy polyps (10-15) The polyps were 3 to 15 mm in                            size. I removed the largest 5 of these polyps with                            cold snare and sent to pathology. Jar 2. Moderate Sedation:      Not Applicable - Patient had care per Anesthesia. Recommendation:           - Patient has a contact number available for                            emergencies. The signs and symptoms of potential                            delayed complications were discussed with the                            patient. Return to normal activities tomorrow.                            Written discharge instructions were provided to the                            patient.                           - Resume previous diet.                           - Continue present medications. OK to resume your                            plavix tomorrow.                           - Await pathology results. Procedure Code(s):        --- Professional ---                           [phone], Colonoscopy, flexible; with removal of                            tumor(s), polyp(s), or other lesion(s) by snare                            technique Diagnosis Code(s):        --- Professional ---                           K63.5, Polyp of colon  Z86.010, Personal history of colonic polyps CPT copyright 2019 [employer]. All rights reserved. The codes documented in this report are preliminary and upon coder review may  be revised to meet current compliance requirements. Milus Banister,  MD 11/11/2020 8:25:45 AM This report has been signed electronically. Number of Addenda: 0

## 2020-11-11 NOTE — Anesthesia Preprocedure Evaluation (Addendum)
Anesthesia Evaluation  Patient identified by MRN, date of birth, ID band Patient awake    Reviewed: Allergy & Precautions, NPO status , Patient's Chart, lab work & pertinent test results  Airway Mallampati: II  TM Distance: >3 FB Neck ROM: Full    Dental  (+) Upper Dentures, Dental Advisory Given   Pulmonary sleep apnea , COPD, Current Smoker,    breath sounds clear to auscultation       Cardiovascular hypertension, + CAD, + Cardiac Stents and + Peripheral Vascular Disease   Rhythm:Regular Rate:Normal     Neuro/Psych  Headaches,  Neuromuscular disease negative psych ROS   GI/Hepatic Neg liver ROS, GERD  ,  Endo/Other  diabetes  Renal/GU Renal disease     Musculoskeletal  (+) Arthritis ,   Abdominal Normal abdominal exam  (+)   Peds  Hematology negative hematology ROS (+)   Anesthesia Other Findings   Reproductive/Obstetrics                            Anesthesia Physical Anesthesia Plan  ASA: III  Anesthesia Plan: MAC   Post-op Pain Management:    Induction: Intravenous  PONV Risk Score and Plan: 0 and 1 and Propofol infusion  Airway Management Planned: Natural Airway and Simple Face Mask  Additional Equipment: None  Intra-op Plan:   Post-operative Plan:   Informed Consent: I have reviewed the patients History and Physical, chart, labs and discussed the procedure including the risks, benefits and alternatives for the proposed anesthesia with the patient or authorized representative who has indicated his/her understanding and acceptance.       Plan Discussed with: CRNA  Anesthesia Plan Comments:        Anesthesia Quick Evaluation

## 2020-11-11 NOTE — H&P (Signed)
HPI: This is a man with multiple colon polyps   ROS: complete GI ROS as described in HPI, all other review negative.  Constitutional:  No unintentional weight loss   Past Medical History:  Diagnosis Date  . Agent orange exposure 1968  . Arthritis   . CAD (coronary artery disease)    4 stents in right artery  . Complex tear of medial meniscus of left knee as current injury 07/26/2012   [place]  . Diabetes mellitus    type 2  . Diverticulitis of colon with perforation 05/23/2016  . DVT (deep venous thrombosis) ([place])   . GERD (gastroesophageal reflux disease)   . Glaucoma 2006   on drops, followed by [place]  . Hx of colonic polyps   . Hyperlipidemia   . Hypertension   . Intra-abdominal abscess ([place]) 05/16/2016  . LUMBAR RADICULOPATHY 07/18/2007   Qualifier: Diagnosis of  By: Arnoldo Morale MD, Balinda Quails   . Myocardial infarction [facility])    1992  . Sleep apnea    uses CPAP regularly    Past Surgical History:  Procedure Laterality Date  . APPENDECTOMY  1958  . BOWEL RESECTION N/A 05/23/2016   Procedure: SMALL BOWEL RESECTION;  Surgeon: Jackolyn Confer, MD;  Location: [facility];  Service: General;  Laterality: N/A;  . CARDIAC CATHETERIZATION  08/11/2008   angiographically patent LAD diagonal, circumflex and multiple circumflex obtuse marginal branches from the L coronary artery; stenting of mid R coronary artery with 3.5 x 63mm Liberte non DES postdilated w/ 4.0 DuraStent [name])  . CARDIOVASCULAR STRESS TEST  06/16/2011   R/P MV - moderate perfusion defect in basal inferoseptal, basal inferior, mid inferseptal and mid inferior regions, consistent with infarct/scar and/or diaphragmatic attenuation; non-gated study secondary to ectopy; no CP or EKG changes for ischemia; abnormal study although no significant changes from previous study; in the absense of gated images cannot calculate EF or distinguish scar/artifact  . COLONOSCOPY  08/2016   TAx5, sessile serrated polyp, diverticulosis, rpt 2 yrs ([place])   . COLOSTOMY Left 05/23/2016   Procedure: COLOSTOMY;  Surgeon: Jackolyn Confer, MD;  Location: [facility];  Service: General;  Laterality: Left;  . COLOSTOMY TAKEDOWN N/A 11/10/2016   Procedure: LAPAROSCOPIC LYSIS OF ADHESIONS AND  COLOSTOMY CLOSURE;  Surgeon: Jackolyn Confer, MD;  Location: [facility];  Service: General;  Laterality: N/A;  . CYST EXCISION  04/2018   posterior neck ([place])  . DOPPLER ECHOCARDIOGRAPHY  08/20/2008   EF >40%; LV systolic function normal; LV mildly dilated; doppler flow suggestive of impaired LV relaxation; RV mildly dilated, RV systolic function normal, RV systolic pressure normal  . INCISION AND DRAINAGE ABSCESS N/A 05/23/2016   Procedure: DRAINAGE OF MULTIPLE INTRA  ABDOMINAL ABSCESS;  Surgeon: Jackolyn Confer, MD;  Location: [facility];  Service: General;  Laterality: N/A;  . KNEE SURGERY Left 2015   torn meniscus [place]alie[facility] Ip)  . LAPAROSCOPIC LYSIS OF ADHESIONS  11/10/2016   Procedure: LAPAROSCOPIC LYSIS OF ADHESIONS;  Surgeon: Jackolyn Confer, MD;  Location: [facility];  Service: General;;  . LAPAROTOMY N/A 05/23/2016   Procedure: EXPLORATORY LAPAROTOMY;  Surgeon: Jackolyn Confer, MD;  Location[facility];  Service: General;  Laterality: N/A;  . MASS EXCISION N/A 05/22/2018   Procedure: EXCISION OF CYST ON POSTERIOR NECK ERAS PATHWAY;  Surgeon: Erroll Luna, MD;  Location: [place];  Service: General;  Laterality: N/A;  . PERCUTANEOUS CORONARY STENT INTERVENTION (PCI-S)  1992, 1999, 2004   4 vessels [name])  . PROCTOSCOPY  11/10/2016  Procedure: PROCTOSCOPY;  Surgeon: Jackolyn Confer, MD;  Location: [facility];  Service: General;;    Current Facility-Administered Medications  Medication Dose Route Frequency Provider Last Rate Last Admin  . 0.9 %  sodium chloride infusion   Intravenous Continuous Milus Banister, MD      . lactated ringers infusion   Intravenous Continuous Milus Banister, MD        Allergies as of 10/04/2020 - Review Complete 09/29/2020   Allergen Reaction Noted  . Dilaudid [hydromorphone hcl] Itching 11/07/2016  . Ambien [zolpidem] Other (See Comments) 11/10/2016  . Niacin and related Other (See Comments) 04/13/2013    Family History  Problem Relation Age of Onset  . Alzheimer's disease Mother   . Heart disease Father   . Heart attack Brother   . Diabetes Brother   . Heart disease Brother   . Heart disease Maternal Grandmother   . Colon cancer Maternal Grandfather   . Diabetes Paternal Grandfather   . Diabetes Son   . Esophageal cancer Neg Hx   . Rectal cancer Neg Hx   . Stomach cancer Neg Hx     Social History   Socioeconomic History  . Marital status: Married    Spouse name: Not on file  . Number of children: Not on file  . Years of education: Not on file  . Highest education level: Not on file  Occupational History  . Occupation: retired  Tobacco Use  . Smoking status: Current Every Day Smoker    Packs/day: 1.00    Years: 50.00    Pack years: 50.00    Types: Cigarettes    Start date: 10/23/1958  . Smokeless tobacco: Never Used  Vaping Use  . Vaping Use: Never used  Substance and Sexual Activity  . Alcohol use: Yes    Alcohol/week: 0.0 standard drinks    Comment: rarely-1 drink every 2-3 months  . Drug use: No  . Sexual activity: Yes  Other Topics Concern  . Not on file  Social History Narrative   Lives with wife and son (with mental issues)   Occupation: retired, was Multimedia programmer for [employer]   Activity: no regular exercise   Diet: good water, fruits/vegetables daily   Social Determinants of Health   Financial Resource Strain: Medium Risk  . Difficulty of Paying Living Expenses: Somewhat hard  Food Insecurity: No Food Insecurity  . Worried About Charity fundraiser in the Last Year: Never true  . Ran Out of Food in the Last Year: Never true  Transportation Needs: No Transportation Needs  . Lack of Transportation (Medical): No  . Lack of Transportation (Non-Medical): No   Physical Activity: Inactive  . Days of Exercise per Week: 0 days  . Minutes of Exercise per Session: 0 min  Stress: No Stress Concern Present  . Feeling of Stress : Not at all  Social Connections: Not on file  Intimate Partner Violence: Not At Risk  . Fear of Current or Ex-Partner: No  . Emotionally Abused: No  . Physically Abused: No  . Sexually Abused: No     Physical Exam: Pulse (!) 53   Temp 97.7 F (36.5 C) (Oral)   Resp 16   Ht 5\' 7"  (1.702 m)   Wt 104.3 kg   SpO2 100%   BMI 36.02 kg/m  Constitutional: generally well-appearing Psychiatric: alert and oriented x3 Abdomen: soft, nontender, nondistended, no obvious ascites, no peritoneal signs, normal bowel sounds No peripheral edema noted in lower extremities  Assessment and plan: 75 y.o. male with multiple colon polyps  Colonoscopy today, he's held his plavix for 5 days.  Please see the "Patient Instructions" section for addition details about the plan.  Owens Loffler, MD [facility] 11/11/2020, 6:55 AM

## 2020-11-11 NOTE — Anesthesia Postprocedure Evaluation (Signed)
Anesthesia Post Note  Patient: TAHJAI SCHETTER  Procedure(s) Performed: COLONOSCOPY WITH PROPOFOL (N/A ) POLYPECTOMY SUBMUCOSAL LIFTING INJECTION     Patient location during evaluation: PACU Anesthesia Type: MAC Level of consciousness: awake and alert Pain management: pain level controlled Vital Signs Assessment: post-procedure vital signs reviewed and stable Respiratory status: spontaneous breathing, nonlabored ventilation, respiratory function stable and patient connected to nasal cannula oxygen Cardiovascular status: stable and blood pressure returned to baseline Postop Assessment: no apparent nausea or vomiting Anesthetic complications: no   No complications documented.  Last Vitals:  Vitals:   11/11/20 0841 11/11/20 0854  BP: (!) 114/50 (!) 140/58  Pulse: (!) 53 (!) 49  Resp: 16 16  Temp:    SpO2: 97% 98%    Last Pain:  Vitals:   11/11/20 0854  TempSrc:   PainSc: 0-No pain                 Effie Berkshire

## 2020-11-11 NOTE — Transfer of Care (Signed)
Immediate Anesthesia Transfer of Care Note  Patient: Evan Avery  Procedure(s) Performed: COLONOSCOPY WITH PROPOFOL (N/A ) POLYPECTOMY SUBMUCOSAL LIFTING INJECTION  Patient Location: PACU and Endoscopy Unit  Anesthesia Type:MAC  Level of Consciousness: awake, alert , oriented and patient cooperative  Airway & Oxygen Therapy: Patient Spontanous Breathing and Patient connected to face mask oxygen  Post-op Assessment: Report given to RN and Post -op Vital signs reviewed and stable  Post vital signs: Reviewed and stable  Last Vitals:  Vitals Value Taken Time  BP    Temp    Pulse 47 11/11/20 0824  Resp 15 11/11/20 0824  SpO2 98 % 11/11/20 0824  Vitals shown include unvalidated device data.  Last Pain:  Vitals:   11/11/20 0645  TempSrc: Oral  PainSc: 0-No pain         Complications: No complications documented.

## 2020-11-11 NOTE — Discharge Instructions (Signed)
Monitored Anesthesia Care, Care After This sheet gives you information about how to care for yourself after your procedure. Your health care provider may also give you more specific instructions. If you have problems or questions, contact your health care provider. What can I expect after the procedure? After the procedure, it is common to have:  Tiredness.  Forgetfulness about what happened after the procedure.  Impaired judgment for important decisions.  Nausea or vomiting.  Some difficulty with balance. Follow these instructions at home: For the time period you were told by your health care provider:  Rest as needed.  Do not participate in activities where you could fall or become injured.  Do not drive or use machinery.  Do not drink alcohol.  Do not take sleeping pills or medicines that cause drowsiness.  Do not make important decisions or sign legal documents.  Do not take care of children on your own.      Eating and drinking  Follow the diet that is recommended by your health care provider.  Drink enough fluid to keep your urine pale yellow.  If you vomit: ? Drink water, juice, or soup when you can drink without vomiting. ? Make sure you have little or no nausea before eating solid foods. General instructions  Have a responsible adult stay with you for the time you are told. It is important to have someone help care for you until you are awake and alert.  Take over-the-counter and prescription medicines only as told by your health care provider.  If you have sleep apnea, surgery and certain medicines can increase your risk for breathing problems. Follow instructions from your health care provider about wearing your sleep device: ? Anytime you are sleeping, including during daytime naps. ? While taking prescription pain medicines, sleeping medicines, or medicines that make you drowsy.  Avoid smoking.  Keep all follow-up visits as told by your health care  provider. This is important. Contact a health care provider if:  You keep feeling nauseous or you keep vomiting.  You feel light-headed.  You are still sleepy or having trouble with balance after 24 hours.  You develop a rash.  You have a fever.  You have redness or swelling around the IV site. Get help right away if:  You have trouble breathing.  You have new-onset confusion at home. Summary  For several hours after your procedure, you may feel tired. You may also be forgetful and have poor judgment.  Have a responsible adult stay with you for the time you are told. It is important to have someone help care for you until you are awake and alert.  Rest as told. Do not drive or operate machinery. Do not drink alcohol or take sleeping pills.  Get help right away if you have trouble breathing, or if you suddenly become confused. This information is not intended to replace advice given to you by your health care provider. Make sure you discuss any questions you have with your health care provider. Document Revised: 06/24/2020 Document Reviewed: 09/11/2019 Elsevier Patient Education  2021 [place]. Colonoscopy, Adult A colonoscopy is a procedure to look at the entire large intestine. This procedure is done using a long, thin, flexible tube that has a camera on the end. You may have a colonoscopy:  As a part of normal colorectal screening.  If you have certain symptoms, such as: ? A low number of red blood cells in your blood (anemia). ? Diarrhea that does not go  away. ? Pain in your abdomen. ? Blood in your stool. A colonoscopy can help screen for and diagnose medical problems, including:  Tumors.  Extra tissue that grows where mucus forms (polyps).  Inflammation.  Areas of bleeding. Tell your health care provider about:  Any allergies you have.  All medicines you are taking, including vitamins, herbs, eye drops, creams, and over-the-counter medicines.  Any  problems you or family members have had with anesthetic medicines.  Any blood disorders you have.  Any surgeries you have had.  Any medical conditions you have.  Any problems you have had with having bowel movements.  Whether you are pregnant or may be pregnant. What are the risks? Generally, this is a safe procedure. However, problems may occur, including:  Bleeding.  Damage to your intestine.  Allergic reactions to medicines given during the procedure.  Infection. This is rare. What happens before the procedure? Eating and drinking restrictions Follow instructions from your health care provider about eating or drinking restrictions, which may include:  A few days before the procedure: ? Follow a low-fiber diet. ? Avoid nuts, seeds, dried fruit, raw fruits, and vegetables.  1-3 days before the procedure: ? Eat only gelatin dessert or ice pops. ? Drink only clear liquids, such as water, clear juice, clear broth or bouillon, black coffee or tea, or clear soft drinks or sports drinks. ? Avoid liquids that contain red or purple dye.  The day of the procedure: ? Do not eat solid foods. You may continue to drink clear liquids until up to 2 hours before the procedure. ? Do not eat or drink anything starting 2 hours before the procedure, or within the time period that your health care provider recommends. Bowel prep If you were prescribed a bowel prep to take by mouth (orally) to clean out your colon:  Take it as told by your health care provider. Starting the day before your procedure, you will need to drink a large amount of liquid medicine. The liquid will cause you to have many bowel movements of loose stool until your stool becomes almost clear or light green.  If your skin or the opening between the buttocks (anus) gets irritated from diarrhea, you may relieve the irritation using: ? Wipes with medicine in them, such as adult wet wipes with aloe and vitamin E. ? A product  to soothe skin, such as petroleum jelly.  If you vomit while drinking the bowel prep: ? Take a break for up to 60 minutes. ? Begin the bowel prep again. ? Call your health care provider if you keep vomiting or you cannot take the bowel prep without vomiting.  To clean out your colon, you may also be given: ? Laxative medicines. These help you have a bowel movement. ? Instructions for enema use. An enema is liquid medicine injected into your rectum. Medicines Ask your health care provider about:  Changing or stopping your regular medicines or supplements. This is especially important if you are taking iron supplements, diabetes medicines, or blood thinners.  Taking medicines such as aspirin and ibuprofen. These medicines can thin your blood. Do not take these medicines unless your health care provider tells you to take them.  Taking over-the-counter medicines, vitamins, herbs, and supplements. General instructions  Ask your health care provider what steps will be taken to help prevent infection. These may include washing skin with a germ-killing soap.  Plan to have someone take you home from the hospital or clinic. What happens during  the procedure?  An IV will be inserted into one of your veins.  You may be given one or more of the following: ? A medicine to help you relax (sedative). ? A medicine to numb the area (local anesthetic). ? A medicine to make you fall asleep (general anesthetic). This is rarely needed.  You will lie on your side with your knees bent.  The tube will: ? Have oil or gel put on it (be lubricated). ? Be inserted into your anus. ? Be gently eased through all parts of your large intestine.  Air will be sent into your colon to keep it open. This may cause some pressure or cramping.  Images will be taken with the camera and will appear on a screen.  A small tissue sample may be removed to be looked at under a microscope (biopsy). The tissue may be sent  to a lab for testing if any signs of problems are found.  If small polyps are found, they may be removed and checked for cancer cells.  When the procedure is finished, the tube will be removed. The procedure may vary among health care providers and hospitals.   What happens after the procedure?  Your blood pressure, heart rate, breathing rate, and blood oxygen level will be monitored until you leave the hospital or clinic.  You may have a small amount of blood in your stool.  You may pass gas and have mild cramping or bloating in your abdomen. This is caused by the air that was used to open your colon during the exam.  Do not drive for 24 hours after the procedure.  It is up to you to get the results of your procedure. Ask your health care provider, or the department that is doing the procedure, when your results will be ready. Summary  A colonoscopy is a procedure to look at the entire large intestine.  Follow instructions from your health care provider about eating and drinking before the procedure.  If you were prescribed an oral bowel prep to clean out your colon, take it as told by your health care provider.  During the colonoscopy, a flexible tube with a camera on its end is inserted into the anus and then passed into the other parts of the large intestine. This information is not intended to replace advice given to you by your health care provider. Make sure you discuss any questions you have with your health care provider. Document Revised: 05/02/2019 Document Reviewed: 05/02/2019 Elsevier Patient Education  2021 [place]. Colon Biopsy, Care After This sheet gives you information about how to care for yourself after your procedure. Your health care provider may also give you more specific instructions. If you have problems or questions, contact your health care provider. What can I expect after the procedure? After the procedure, it is common to have:  A small amount of  blood in your stool (feces) for 24 hours after the procedure.  Some gas.  Mild cramping or bloating in your abdomen. Follow these instructions at home: For the time period you were told by your health care provider:  Rest.  Do not participate in activities where you could fall or become injured.  Do not drive or use machinery.  Do not drink alcohol.  Do not take sleeping pills or medicines that cause drowsiness.  Do not make important decisions or sign legal documents.  Do not take care of children on your own.  Do your regular daily activities  at a slower pace than normal.  Eat soft, easy-to-digest foods. Eating and drinking  Drink enough fluid to keep your urine pale yellow.  Return to your normal diet as told by your health care provider. Avoid heavy or fried foods that are hard to digest.  Avoid drinking alcohol for as long as told by your health care provider. Relieving cramping and bloating  Try walking around when you have cramps or feel bloated.  If directed, apply heat to your abdomen as often as told by your health care provider. Use the heat source that your health care provider recommends, such as a moist heat pack or a heating pad. ? Place a towel between your skin and the heat source. ? Leave the heat on for 20-30 minutes. ? Remove the heat if your skin turns bright red. This is especially important if you are unable to feel pain, heat, or cold. You have a greater risk of getting burned. ? Do not sleep with the heat source in place.   General instructions  Take over-the-counter or prescription medicines only as told by your health care provider.  Keep all follow-up visits. This is important. Contact a health care provider if:  You have blood in your stool 2-3 days after the procedure. Get help right away if:  You have more than a small spotting of blood in your stool.  You pass large blood clots in your stool.  You have more bloating or swelling in  your abdomen.  You have nausea or vomiting.  You have a fever.  You have increasing pain in your abdomen that is not relieved with medicine. Summary  After the procedure, it is common to have mild cramping and bloating in the abdomen.  Do not drive or use machinery for the time period you were told by your health care provider.  Try walking around when you have cramps or feel bloated. This information is not intended to replace advice given to you by your health care provider. Make sure you discuss any questions you have with your health care provider. Document Revised: 01/28/2020 Document Reviewed: 01/28/2020 Elsevier Patient Education  2021 [employer].

## 2020-11-12 LAB — SURGICAL PATHOLOGY

## 2020-11-13 ENCOUNTER — Encounter: Payer: Self-pay | Admitting: Family Medicine

## 2020-11-18 DIAGNOSIS — M5387 Other specified dorsopathies, lumbosacral region: Secondary | ICD-10-CM | POA: Diagnosis not present

## 2020-11-18 DIAGNOSIS — M9903 Segmental and somatic dysfunction of lumbar region: Secondary | ICD-10-CM | POA: Diagnosis not present

## 2020-11-18 DIAGNOSIS — M9904 Segmental and somatic dysfunction of sacral region: Secondary | ICD-10-CM | POA: Diagnosis not present

## 2020-11-22 DIAGNOSIS — M9903 Segmental and somatic dysfunction of lumbar region: Secondary | ICD-10-CM | POA: Diagnosis not present

## 2020-11-22 DIAGNOSIS — M5387 Other specified dorsopathies, lumbosacral region: Secondary | ICD-10-CM | POA: Diagnosis not present

## 2020-11-22 DIAGNOSIS — M9904 Segmental and somatic dysfunction of sacral region: Secondary | ICD-10-CM | POA: Diagnosis not present

## 2020-11-25 DIAGNOSIS — M5387 Other specified dorsopathies, lumbosacral region: Secondary | ICD-10-CM | POA: Diagnosis not present

## 2020-11-25 DIAGNOSIS — M9903 Segmental and somatic dysfunction of lumbar region: Secondary | ICD-10-CM | POA: Diagnosis not present

## 2020-11-25 DIAGNOSIS — M9904 Segmental and somatic dysfunction of sacral region: Secondary | ICD-10-CM | POA: Diagnosis not present

## 2020-11-30 ENCOUNTER — Telehealth: Payer: Self-pay | Admitting: Family Medicine

## 2020-11-30 NOTE — Telephone Encounter (Signed)
LVM for pt to rtn my call to r/s appt with nha on 01/14/21

## 2020-12-07 ENCOUNTER — Other Ambulatory Visit: Payer: Self-pay | Admitting: Cardiovascular Disease

## 2020-12-07 ENCOUNTER — Other Ambulatory Visit: Payer: Self-pay | Admitting: Family Medicine

## 2020-12-20 ENCOUNTER — Encounter: Payer: Self-pay | Admitting: Cardiovascular Disease

## 2020-12-20 ENCOUNTER — Ambulatory Visit (INDEPENDENT_AMBULATORY_CARE_PROVIDER_SITE_OTHER): Payer: HMO | Admitting: Cardiovascular Disease

## 2020-12-20 ENCOUNTER — Other Ambulatory Visit: Payer: Self-pay

## 2020-12-20 VITALS — BP 132/56 | HR 49 | Ht 68.0 in | Wt 235.0 lb

## 2020-12-20 DIAGNOSIS — I10 Essential (primary) hypertension: Secondary | ICD-10-CM

## 2020-12-20 DIAGNOSIS — Z720 Tobacco use: Secondary | ICD-10-CM | POA: Diagnosis not present

## 2020-12-20 DIAGNOSIS — E118 Type 2 diabetes mellitus with unspecified complications: Secondary | ICD-10-CM

## 2020-12-20 DIAGNOSIS — G4733 Obstructive sleep apnea (adult) (pediatric): Secondary | ICD-10-CM | POA: Diagnosis not present

## 2020-12-20 DIAGNOSIS — Z9989 Dependence on other enabling machines and devices: Secondary | ICD-10-CM

## 2020-12-20 DIAGNOSIS — R001 Bradycardia, unspecified: Secondary | ICD-10-CM | POA: Diagnosis not present

## 2020-12-20 DIAGNOSIS — K219 Gastro-esophageal reflux disease without esophagitis: Secondary | ICD-10-CM | POA: Diagnosis not present

## 2020-12-20 DIAGNOSIS — I2510 Atherosclerotic heart disease of native coronary artery without angina pectoris: Secondary | ICD-10-CM

## 2020-12-20 DIAGNOSIS — E785 Hyperlipidemia, unspecified: Secondary | ICD-10-CM | POA: Diagnosis not present

## 2020-12-20 NOTE — Patient Instructions (Signed)
Medication Instructions:  The current medical regimen is effective;  continue present plan and medications.  *If you need a refill on your cardiac medications before your next appointment, please call your pharmacy*   Follow-Up: At [facility], you and your health needs are our priority.  As part of our continuing mission to provide you with exceptional heart care, we have created designated Provider Care Teams.  These Care Teams include your primary Cardiologist (physician) and Advanced Practice Providers (APPs -  Physician Assistants and Nurse Practitioners) who all work together to provide you with the care you need, when you need it.  We recommend signing up for the patient portal called "MyChart".  Sign up information is provided on this After Visit Summary.  MyChart is used to connect with patients for Virtual Visits (Telemedicine).  Patients are able to view lab/test results, encounter notes, upcoming appointments, etc.  Non-urgent messages can be sent to your provider as well.   To learn more about what you can do with MyChart, go to [email or web address].    Your next appointment:   6 month(s)  The format for your next appointment:   In Person  Provider:   Thomas Kelly, MD    

## 2020-12-20 NOTE — Progress Notes (Signed)
Patient ID: Evan Avery, male   DOB: [birth year], 75 y.o.   MRN: [id number]   Primary M.D.: Evan Avery  HPI: Evan Avery is a 75 y.o. male who presents today for a 6 month cardiology evaluation.  Evan Avery has known CAD and has a total of 4 stents in his RCA. In 1992, he underwent initial PTCA of his RCA. Subsequently, he underwent stenting of the proximal and mid RCA.  In 1999 his fourth stent was placed in the mid RCA between the 2 previously placed stents and was a 3.5x28 mm non-DES Liberty stent. His last nuclear perfusion study in August 2012 showed inferior thinning without significant ischemia.  EF was not calculated secondary to  ectopy but previously had been 52%.  Additional problems include hypertension, hyperlipidemia, obesity, and obstructive sleep apnea for which he is he uses CPAP 100% of the time and uses  Evan Avery for his [employer].  Since I last saw him in May 2017.  He denied any recurrent episodes of chest pain or significant dyspnea.  He had a new CPAP machine and continue to be compliant.  He established primary care with Evan Avery.  Review of laboratory that he had with him in December 2016 has shown a cholesterol of 149, HDL 27, but his triglycerides were 413.  Remotely he had triglyceride elevations as high as 584.  In December, his hemoglobin A1c was 7.7.  He underwent surgery in July 2017 due to an abscess in his colon.  He required a colostomy for 6 months and in January 2018 underwent colostomy closure.   I saw him in October 2018 after not having seen him in over 17 months.  Since that evaluation, he has undergone 2 resections for cyst on the back of his neck with recurrent infections.  He recently saw Dr. [name] consideration of surgical excision.  He has felt well with reference to chest pain or shortness of breath.  He had smoked for many years but quit smoking for 10 years but unfortunately resumed smoking in June 2018 and currently smoking  1 pack/day.  He has not had a nuclear perfusion study since 2012.  He continues to use CPAP with 100% compliance.  He was  started on Januvia for his diabetes mellitus by his primary physician.    When I saw hime in June 2019  he was in need of back and neck surgery.  I recommended he undergo a Yell study for preoperative assessment.  This was done on April 23, 2018 which showed a small inferior defect, low risk.  EF was 50% with basal inferior hypokinesis.  He tolerated his neck and back surgery well.  Is only he is without anginal symptoms.  He has a documented umbilical/ventral  hernia and has been without abdominal discomfort.  He continues to use CPAP with 100% compliance.  He received a new CPAP machine at the [facility] is a [employer]Med AirSense 10.  He gets his supplies from the [place].    I saw him in February 2020 and  saw him in January 2021.  He denied any chest pain  with walking but does admit to some mild shortness of breath.  Unfortunately he still smokes 1 pack of cigarettes per day.  He had experienced some throat burning which had improved with pantoprazole.  He has an umbilical hernia and ventral hernia but denies any pain.  He continues to be on aspirin and Plavix for DAPT.  He  is on atorvastatin 65m,  Zetia 10 mg and Lovaza 2 capsules twice daily for mixed hyperlipidemia.  He continues to be on lisinopril 5 mg and atenolol 50 mg in the morning and 25 mg in the evening for hypertension.  There are no palpitations.  He is diabetic on glimepiride and Metformin.  He was bradycardic but was entirely asymptomatic.  I last saw him in July 2021 at that time he stated that he had experienced  several episodes of chest heaviness.  His episodes were short-lived times he would take Tums with benefit.  Unfortunately he still smokes cigarettes.  He has been drinking an excessive amount of coffee typically at least 6 cups/day.  There was some dyspepsia and he recently started taking Pepcid with some  improvement.  He apparently is need [place], I again discussed the importance of smoking cessation.  With his longstanding history of CAD, and recent chest discomfort I recommended he undergo 2-year follow-up Lexiscan Myoview for further evaluation.    He underwent a LUgandaMyoview study on May 11, 2020.  There was evidence for small size mild fixed mid inferior perfusion defect most likely scar.  There was no associated ischemia.  EF was 43% with basal to mid inferior hypokinesis.  There was no change from the prior study in 2019.  I last saw him on June 21, 2020 at which time he felt improved.  He denied any recurrent chest pain or exertional symptoms.  He was trying to significantly reduce his tobacco which previously had been 1 to 2 packs/day and most recently was 1 pack/day.  During that evaluation, his ECG showed marked sinus bradycardia at 43 bpm.  I recommended he reduce his a atenolol to 25 mg twice a day.  Since I last saw him, he has remained stable.  He has issues with GERD and is now on Pepcid 40 mg twice daily followed by Dr. JArdis Hughsof GI.  He continues to use [place]  Unfortunately he is still smoking 1 pack/day.  He will be undergoing repeat laboratory in 2 weeks with Dr. GDanise Avery  He denies any recurrent chest pain PND orthopnea, palpitations, presyncope or syncope.  Past Medical History:  Diagnosis Date  . Agent orange exposure 1968  . Arthritis   . CAD (coronary artery disease)    4 stents in right artery  . Complex tear of medial meniscus of left knee as current injury 07/26/2012   Evan Avery  . Diabetes mellitus    type 2  . Diverticulitis of colon with perforation 05/23/2016  . [place]   . GERD (gastroesophageal reflux disease)   . Glaucoma 2006   [place] drops[place] [facility] . Hx of colonic polyps   . Hyperlipidemia   . Hypertension   . [place]dominal  [place]cess [place] 05/16/2016  . LUMBAR RADICULOPATHY 07/18/2007   Qualifier: Diagnosis of  By: [name], [name]  . Myocardial infarction ([facility]    1992  . Sleep apnea    uses CPAP regularly    Past Surgical History:  Procedure Laterality Date  . APPENDECTOMY  1958  . BOWEL RESECTION N/A 05/23/2016   Procedure: SMALL BOWEL RESECTION;  Surgeon: TJackolyn Confer MD;  [facility]: [facility];  Service: General;  Laterality: N/A;  . CARDIAC CATHETERIZATION  08/11/2008   angiographically patent LAD diagonal, circumflex and multiple circumflex obtuse marginal branches from  the L coronary artery; stenting of mid R coronary artery with 3.5 x 35m Liberte non DES postdilated w/ 4.0 DuraStent ([name]  . CARDIOVASCULAR STRESS TEST  06/16/2011   R/P MV - moderate perfusion defect in basal inferoseptal, basal inferior, mid inferseptal and mid inferior regions, consistent with infarct/scar and/or diaphragmatic attenuation; non-gated study secondary to ectopy; no CP or EKG changes for ischemia; abnormal study although no significant changes from previous study; in the absense of gated images cannot calculate EF or distinguish scar/artifact  . COLONOSCOPY  08/2016   TAx5, sessile serrated polyp, diverticulosis, rpt 2 yrs ([place])  . COLONOSCOPY  10/2020   1.5cm SSP, HP, rpt (Ardis Hughs  . COLONOSCOPY  08/2020   multiple polpys removed (TA, HP), 1.5cm polyp biopsied (SSP), colon lipoma (Ardis Hughs  . COLONOSCOPY WITH PROPOFOL N/A 11/11/2020   Procedure: COLONOSCOPY WITH PROPOFOL;  Surgeon: JMilus Banister MD;  [facility];  Service: Endoscopy;  Laterality: N/A;  . COLOSTOMY Left 05/23/2016   Procedure: COLOSTOMY;  Surgeon: TJackolyn Confer MD;  [facility];  Service: General;  Laterality: Left;  . COLOSTOMY TAKEDOWN N/A 11/10/2016   Procedure: LAPAROSCOPIC LYSIS OF ADHESIONS AND  COLOSTOMY CLOSURE;  Surgeon: TJackolyn Confer MD;  [facility];  Service: General;  Laterality: N/A;  . CYST EXCISION  04/2018    posterior neck ([place])  . DOPPLER ECHOCARDIOGRAPHY  08/20/2008   EF >>45% LV systolic function normal; LV mildly dilated; doppler flow suggestive of impaired LV relaxation; RV mildly dilated, RV systolic function normal, RV systolic pressure normal  . ESOPHAGOGASTRODUODENOSCOPY  08/2020   gastritis, duodenitis likely NSAID/ASA related, H pylori neg - rec stop aspirin (Ardis Hughs  . INCISION AND DRAINAGE ABSCESS N/A 05/23/2016   Procedure: DRAINAGE OF MULTIPLE INTRA  ABDOMINAL ABSCESS;  Surgeon: TJackolyn Confer MD;  [facility];  Service: General;  Laterality: N/A;  . KNEE SURGERY Left 2015   torn meniscus ([place]  . LAPAROSCOPIC LYSIS OF ADHESIONS  11/10/2016   Procedure: LAPAROSCOPIC LYSIS OF ADHESIONS;  Surgeon: TJackolyn Confer MD;  [facility];  Service: General;;  . LAPAROTOMY N/A 05/23/2016   Procedure: EXPLORATORY LAPAROTOMY;  Surgeon: TJackolyn Confer MD;  [facility];  Service: General;  Laterality: N/A;  . MASS EXCISION N/A 05/22/2018   Procedure: EXCISION OF CYST ON POSTERIOR NECK ERAS PATHWAY;  Surgeon: CErroll Luna MD;  [place]  Service: General;  Laterality: N/A;  . PERCUTANEOUS CORONARY STENT INTERVENTION (PCI-S)  1992, 1999, 2004   4 vessels ([name]  . POLYPECTOMY  11/11/2020   Procedure: POLYPECTOMY;  Surgeon: JMilus Banister MD;  [facility];  Service: Endoscopy;;  . PROCTOSCOPY  11/10/2016   Procedure: PROCTOSCOPY;  Surgeon: TJackolyn Confer MD;  [facility];  Service: General;;  . SUBMUCOSAL LIFTING INJECTION  11/11/2020   Procedure: SUBMUCOSAL LIFTING INJECTION;  Surgeon: JMilus Banister MD;  [facility];  Service: Endoscopy;;    Allergies  Allergen Reactions  . Dilaudid [Hydromorphone Hcl] Itching  . Ambien [Zolpidem] Other (See Comments)    Pt states "makes me wild"  . Niacin And Related Other (See Comments)    Extreme fatigue    Current Outpatient Medications  Medication Sig  Dispense Refill  . atenolol (TENORMIN) 25 MG tablet Take 1 tablet (25 mg total) by mouth 2 (two) times daily. 180 tablet 3  . atorvastatin (LIPITOR) 40 MG tablet TAKE 1 TABLET BY MOUTH ONCE DAILY AT  6PM 90 tablet 0  . Brinzolamide-Brimonidine  1-0.2 % SUSP Apply 1 drop to eye 3 (three) times daily.     . cetirizine (ZYRTEC) 10 MG tablet Take 10 mg by mouth daily as needed for allergies.    Marland Kitchen clopidogrel (PLAVIX) 75 MG tablet Take 1 tablet by mouth once daily 90 tablet 3  . Coenzyme Q10 (COQ10) 200 MG CAPS Take 200 mg by mouth daily.    Marland Kitchen ezetimibe (ZETIA) 10 MG tablet Take 1 tablet by mouth once daily 90 tablet 2  . famotidine (PEPCID) 40 MG tablet Take 1 tablet (40 mg total) by mouth 2 (two) times daily. 60 tablet 11  . fluticasone (FLONASE) 50 MCG/ACT nasal spray Place 2 sprays into both nostrils daily. 16 g 1  . glimepiride (AMARYL) 4 MG tablet Take 1 tablet by mouth once daily with breakfast (Patient taking differently: TAKE 1 TABLET BY MOUTH ONCE DAILY WITH  BREAKFAST) 90 tablet 1  . latanoprost (XALATAN) 0.005 % ophthalmic solution Place 1 drop into both eyes at bedtime.    Marland Kitchen lisinopril (ZESTRIL) 5 MG tablet Take 1 tablet by mouth once daily 90 tablet 3  . metFORMIN (GLUCOPHAGE) 1000 MG tablet Take 1 tablet (1,000 mg total) by mouth 2 (two) times daily with a meal. 180 tablet 3  . nitroGLYCERIN (NITROSTAT) 0.4 MG SL tablet Place 1 tablet (0.4 mg total) under the tongue every 5 (five) minutes as needed for chest pain. 30 tablet 3  . omega-3 acid ethyl esters (LOVAZA) 1 g capsule Take 2 capsules by mouth twice daily 360 capsule 3  . pioglitazone (ACTOS) 15 MG tablet Take 1 tablet by mouth once daily 90 tablet 0  . senna-docusate (SENOKOT-S) 8.6-50 MG tablet Take 1 tablet by mouth at bedtime as needed for mild constipation. 15 tablet 0   No current facility-administered medications for this visit.    Socially he is married has 2 children 2 grandchildren. He does walk several times a week. He  does have a tobacco history. There is no alcohol use.   ROS General: Negative; No fevers, chills, or night sweats; positive for obesity. HEENT: Negative; No changes in vision or hearing, sinus congestion, difficulty swallowing Pulmonary: Negative; No cough, wheezing, shortness of breath, hemoptysis Cardiovascular: See HPI GI: Colonic abscess requiring colostomy, and ultimate closure; positive for dyspepsia GU: Negative; No dysuria, hematuria, or difficulty voiding Musculoskeletal: He is status post left knee surgery; no myalgias, joint pain, or weakness Hematologic/Oncology: Negative; no easy bruising, bleeding Endocrine: Negative; no heat/cold intolerance; no diabetes Neuro: Negative; no changes in balance, headaches Skin: Recurrent cysts in the back of his neck Psychiatric: Negative; No behavioral problems, depression Sleep: Positive for sleep apnea, on CPAP with 100% compliance. No snoring, daytime sleepiness, hypersomnolence, bruxism, restless legs, hypnogognic hallucinations, no cataplexy Other comprehensive 14 point system review is negative.   PE BP (!) 132/56 (BP Location: Left Arm, Patient Position: Sitting)   Pulse (!) 49   Ht 5' 8"  (1.727 m)   Wt 235 lb (106.6 kg)   SpO2 94%   BMI 35.73 kg/m    Repeat blood pressure 132/66  Wt Readings from Last 3 Encounters:  12/20/20 235 lb (106.6 kg)  11/11/20 230 lb (104.3 kg)  09/24/20 232 lb 8 oz (105.5 kg)   General: Alert, oriented, no distress.  Skin: normal turgor, no rashes, warm and dry HEENT: Normocephalic, atraumatic. Pupils equal round and reactive to light; sclera anicteric; extraocular muscles intact;  Nose without nasal septal hypertrophy Mouth/Parynx benign; Mallinpatti scale 3 Neck: Thick neck;  no JVD, no carotid bruits; normal carotid upstroke Lungs: clear to ausculatation and percussion; no wheezing or rales Chest wall: without tenderness to palpitation Heart: PMI not displaced, RRR, s1 s2 normal, 1/6  systolic murmur, no diastolic murmur, no rubs, gallops, thrills, or heaves Abdomen: soft, nontender; no hepatosplenomehaly, BS+; abdominal aorta nontender and not dilated by palpation. Back: no CVA tenderness Pulses 2+ Musculoskeletal: full range of motion, normal strength, no joint deformities Extremities: no clubbing cyanosis or edema, Homan's sign negative  Neurologic: grossly nonfocal; Cranial nerves grossly wnl Psychologic: Normal mood and affect   ECG (independently read by me): Sinus bradycardia at 49  August 2021 ECG (independently read by me): Sinus Bradycardia at 43; no ectopy; normal intervals  July 2021ECG (independently read by me): Sinus bradycardia at 43 bpm.  No significant ST-T changes.  Normal intervals.  No ectopy  November 10, 2019 ECG (independently read by me):Sinus bradycardia at 46 bpm.  Nonspecific T changes.  Normal intervals.  No ectopy  February 2020 ECG (independently read by me): Sinus Bradycardioa 51; Norml intervals  June 2018 ECG (independently read by me): Sinus bradycardia 57 bpm.  No ectopy.  Normal intervals.  October 2018 ECG (independently read by me): Normal sinus rhythm with PAC.  PR interval 168 ms.  QTc interval 454 ms.  May 2017 ECG (independently read by me): Sinus bradycardia 52 bpm.  T-wave abnormality in leads 3 and aVF.  May 2016 ECG (independently read by me): Sinus bradycardia 53 bpm.  Nondiagnostic T changes with T-wave inversion in lead 3.  June 2015 ECG (independently read by me):  sinus bradycardia at 55 QTc interval 447 ms.  PR interval 174 ms.  Nondiagnostic T changes in lead 3.  ECG: Sinus rhythm at 56 beats per minute. No ectopy. Normal intervals; nonspecific T changes in lead 3.  LABS: BMP Latest Ref Rng & Units 09/24/2020 03/12/2020 01/02/2020  Glucose 70 - 99 mg/dL 245(H) - 238(H)  BUN 6 - 23 mg/dL 17 - 20  Creatinine 0.40 - 1.50 mg/dL 1.09 1.2 1.05  Sodium 135 - 145 mEq/L 139 139 139  Potassium 3.5 - 5.1 mEq/L 4.3 4.9  4.7  Chloride 96 - 112 mEq/L 104 - 104  CO2 19 - 32 mEq/L 26 - 28  Calcium 8.4 - 10.5 mg/dL 9.6 - 9.5   Hepatic Function Latest Ref Rng & Units 03/12/2020 01/02/2020 02/18/2019  Total Protein 6.0 - 8.3 g/dL - 6.3 -  Albumin 3.5 - 5.2 g/dL - 4.0 -  AST 14 - 40 17 13 15   ALT 10 - 40 35 17 30  Alk Phosphatase 25 - 125 63 64 71  Total Bilirubin 0.2 - 1.2 mg/dL - 0.7 -  Bilirubin, Direct 0.01 - 0.4 0.1 - -    CBC Latest Ref Rng & Units 03/12/2020 01/02/2020 02/18/2019  WBC - 8.2 7.7 7.2  Hemoglobin 13.5 - 17.5 14.0 13.6 13.2(A)  Hematocrit 39.0 - 52.0 % - 39.7 -  Platelets 150 - 399 133(A) 144.0(L) 147(A)   Lab Results  Component Value Date   TSH 0.90 03/12/2020   Lab Results  Component Value Date   HGBA1C 7.4 (A) 07/12/2020   Lipid Panel     Component Value Date/Time   CHOL 78 03/12/2020 0000   CHOL 149 03/13/2016 0803   TRIG 208 (A) 03/12/2020 0000   TRIG 387 (H) 03/13/2016 0803   HDL 30 (A) 03/12/2020 0000   HDL 26 (L) 03/13/2016 0803   CHOLHDL 3 01/02/2020 1791  VLDL 31.2 01/02/2020 0835   LDLCALC 75 03/12/2020 0000   LDLCALC 46 03/13/2016 0803   LDLDIRECT 61.0 12/27/2018 1245    RADIOLOGY: No results found.  Carlton Adam Myoview 05/11/2020 Study Highlights    The left ventricular ejection fraction is moderately decreased (30-44%).  Nuclear stress EF: 43%.  No T wave inversion was noted during stress.  There was no ST segment deviation noted during stress.  Defect 1: There is a small defect of mild severity present in the mid inferior location.  Findings consistent with prior myocardial infarction.  This is an intermediate risk study.   Small size, mild severity fixed mid inferior perfusion defect, likely scar. LVEF 43% with basal to mid inferior hypokinesis. This is an intermediate risk study. Compared to a prior study in 2019, the LVEF is calculated lower, but appears visually higher - the perfusion defect appears unchanged.    IMPRESSION:  1.  Essential hypertension   2. CAD in native artery   3. Hyperlipidemia with target LDL less than 70   4. Type 2 diabetes mellitus with complication, without long-term current use of insulin ([place])   5. OSA on CPAP   6. Bradycardia   7. Tobacco abuse   8. Gastroesophageal reflux disease without esophagitis      ASSESSMENT AND PLAN: Mr. Boulay  is a 75 year old Caucasian male who has CAD and underwent initial PTCA to his RCA in 1992.  His last catheterization was in October 2009 when his fourth stent was placed in his RCA.   He continues to be on dual antiplatelet therapy with aspirin and Plavix with his multiple stents.  A preoperative Lexiscan Myoview study in July 2019 prior to his back surgery was low risk and showed a small inferior defect with EF 50%.  When I saw him in July 2021 he had developed nonexertional episodes of chest pressure which were short-lived and often improved with antacid therapy. Symptoms would typically last a minute or 2.  He was started on Pepcid with improvement.  At the time he was drinking over 6 cups of coffee per day which undoubtedly contributed to his dyspeptic symptomatology.  His Cerulean study was unchanged from 2019.  Presently, his blood pressure is stable on current therapy.  He is not having anginal symptoms.  He continues to be bradycardic but heart rate is now 49-50 on atenolol 25 mg twice a day.  He is also on lisinopril 5 mg daily.  We have been aggressive with lipid lowering therapy and he continues to be on atorvastatin 40 mg, Zetia 10 mg, and Lovaza 2 capsules twice a day.  In May 2021 LDL cholesterol was 75.  He will be undergoing repeat blood work with his primary MD in 2 weeks.  Target LDL is less than 70.  He is diabetic on glimepiride, Metformin, in addition to Actos.  Hemoglobin A1c was 7.4 in September 2021.  His GERD is now controlled on increased Pepcid at 40 mg twice a day.  He is followed by Dr. Ardis Hughs.  He continues to use CPAP with 100%  compliance which is now followed at the [place].  Unfortunately he is still smoking a pack a day.  I had a long discussion regarding tobacco cessation.  I will see him in 6 months for reevaluation      Troy Sine, MD, [facility]  12/22/2020 4:26 PM

## 2020-12-22 ENCOUNTER — Telehealth: Payer: Self-pay

## 2020-12-22 ENCOUNTER — Encounter: Payer: Self-pay | Admitting: Cardiovascular Disease

## 2020-12-22 NOTE — Chronic Care Management (AMB) (Addendum)
Chronic Care Management Pharmacy Assistant   Name: Evan Avery  MRN: [id number] DOB: 10/14/1946  Reason for Encounter: Disease State- Diabetes  Patient Question:  1.  Have you seen any other providers or had any medication changes since your last visit with [name], Pharm. D? Yes  11/11/20 Dr. Owens Loffler- Colonoscopy   PCP : [name], MD  Allergies:   Allergies  Allergen Reactions   Dilaudid [Hydromorphone Hcl] Itching   Ambien [Zolpidem] Other (See Comments)    Pt states "makes me wild"   Niacin And Related Other (See Comments)    Extreme fatigue    Medications: Outpatient Encounter Medications as of 12/22/2020  Medication Sig Note   atenolol (TENORMIN) 25 MG tablet Take 1 tablet (25 mg total) by mouth 2 (two) times daily.    atorvastatin (LIPITOR) 40 MG tablet TAKE 1 TABLET BY MOUTH ONCE DAILY AT  6PM    Brinzolamide-Brimonidine 1-0.2 % SUSP Apply 1 drop to eye 3 (three) times daily.     cetirizine (ZYRTEC) 10 MG tablet Take 10 mg by mouth daily as needed for allergies.    clopidogrel (PLAVIX) 75 MG tablet Take 1 tablet by mouth once daily    Coenzyme Q10 (COQ10) 200 MG CAPS Take 200 mg by mouth daily.    ezetimibe (ZETIA) 10 MG tablet Take 1 tablet by mouth once daily    famotidine (PEPCID) 40 MG tablet Take 1 tablet (40 mg total) by mouth 2 (two) times daily.    fluticasone (FLONASE) 50 MCG/ACT nasal spray Place 2 sprays into both nostrils daily.    glimepiride (AMARYL) 4 MG tablet Take 1 tablet by mouth once daily with breakfast (Patient taking differently: TAKE 1 TABLET BY MOUTH ONCE DAILY WITH  BREAKFAST)    latanoprost (XALATAN) 0.005 % ophthalmic solution Place 1 drop into both eyes at bedtime.    lisinopril (ZESTRIL) 5 MG tablet Take 1 tablet by mouth once daily    metFORMIN (GLUCOPHAGE) 1000 MG tablet Take 1 tablet (1,000 mg total) by mouth 2 (two) times daily with a meal.    nitroGLYCERIN (NITROSTAT) 0.4 MG SL tablet Place 1 tablet (0.4 mg  total) under the tongue every 5 (five) minutes as needed for chest pain. 07/16/2020: On hand   omega-3 acid ethyl esters (LOVAZA) 1 g capsule Take 2 capsules by mouth twice daily    pioglitazone (ACTOS) 15 MG tablet Take 1 tablet by mouth once daily    senna-docusate (SENOKOT-S) 8.6-50 MG tablet Take 1 tablet by mouth at bedtime as needed for mild constipation.    No facility-administered encounter medications on file as of 12/22/2020.    Current Diagnosis: Patient Active Problem List   Diagnosis Date Noted   Bradycardia 04/10/2020   Type 2 diabetes mellitus with diabetic nephropathy, without long-term current use of insulin ([place]) 04/10/2020   Headache 04/09/2020   Stenosis of right subclavian artery ([place]) 02/08/2020   Atypical nevus 01/11/2020   GERD (gastroesophageal reflux disease) 09/03/2019   Adenoma of left adrenal gland 08/25/2019   Thoracic aorta atherosclerosis ([place]) 05/18/2019   COPD (chronic obstructive pulmonary disease) ([place]) 05/18/2019   Fatty liver 03/30/2018   Gallstone 03/30/2018   Acute bacterial sinusitis 12/18/2017   Osteoarthritis 03/08/2017   Epidermal cyst of neck 03/06/2017   Glaucoma    Health maintenance examination 10/07/2014   Medicare annual wellness visit, subsequent 10/07/2014   Advanced care planning/counseling discussion 10/07/2014   Smoker 10/07/2014   Bilateral foot pain 09/07/2014  CAD in native artery 04/17/2014   Hyperlipidemia with target LDL less than 70 10/08/2013   Essential hypertension 10/08/2013   Obesity, Class I, BMI 30.0-34.9 (see actual BMI) 10/08/2013   Complex tear of medial meniscus of left knee as current injury 07/26/2012   OTHER ATOPIC DERMATITIS AND RELATED CONDITIONS 06/24/2010   LUMBAR RADICULOPATHY 07/18/2007   Diabetes mellitus type 2, uncontrolled, with complications ([place]) 20/07/711   HIP PAIN, BILATERAL 07/05/2007   OSA on CPAP 05/07/2007   Agent orange exposure 10/23/1966    Recent Relevant Labs: Lab Results   Component Value Date/Time   HGBA1C 7.4 (A) 07/12/2020 08:02 AM   HGBA1C 8.2 03/12/2020 12:00 AM   HGBA1C 9.2 (H) 01/02/2020 08:35 AM   HGBA1C 8.9 (A) 09/03/2019 01:20 PM   HGBA1C 8.4 02/12/2019 12:00 AM   MICROALBUR 0.2 12/27/2007 10:00 AM    Kidney Function Lab Results  Component Value Date/Time   CREATININE 1.09 09/24/2020 10:02 AM   CREATININE 1.2 03/12/2020 12:00 AM   CREATININE 1.05 01/02/2020 08:35 AM   CREATININE 0.96 03/13/2016 08:03 AM   GFR 66.56 09/24/2020 10:02 AM   GFRNONAA >60 05/15/2018 03:27 PM   GFRAA >60 05/15/2018 03:27 PM   Multiple attempts made to contact patient. Multiple message left to return call for DM disease state call. Will follow up in  May for DM call again.   Current antihyperglycemic regimen:  Glimepiride 4 mg - 1 tablet daily Metformin 1000 mg - 1 tablet twice daily  Actos 15 mg- 1 tablet daily  Unable to verify with patient   Adherence Review: Is the patient currently on a STATIN medication? Yes Is the patient currently on ACE/ARB medication? Yes Does the patient have >5 day gap between last estimated fill dates? CPP to review   Follow-Up:  Pharmacist Review  [name], CPP notified  Margaretmary Dys, [place] [phone]  Reviewed refill history - no gaps in adherence. PCP visit this month. Agree follow up in May. Pt only on Actos until able to get Jardiance at no cost through [place].   [name], PharmD Clinical Pharmacist [facility] [phone]  Total time spent for month: 40

## 2021-01-02 ENCOUNTER — Other Ambulatory Visit: Payer: Self-pay | Admitting: Family Medicine

## 2021-01-02 DIAGNOSIS — D696 Thrombocytopenia, unspecified: Secondary | ICD-10-CM

## 2021-01-02 DIAGNOSIS — E1165 Type 2 diabetes mellitus with hyperglycemia: Secondary | ICD-10-CM

## 2021-01-02 DIAGNOSIS — E785 Hyperlipidemia, unspecified: Secondary | ICD-10-CM

## 2021-01-02 DIAGNOSIS — Z125 Encounter for screening for malignant neoplasm of prostate: Secondary | ICD-10-CM

## 2021-01-02 DIAGNOSIS — IMO0002 Reserved for concepts with insufficient information to code with codable children: Secondary | ICD-10-CM

## 2021-01-03 ENCOUNTER — Other Ambulatory Visit: Payer: PPO

## 2021-01-03 NOTE — Telephone Encounter (Signed)
Pharmacy requests refill on: Omega 3 Acid Ethyl Esters 1 g  LAST REFILL: 02/27/2020 (Q-360, R-3) LAST OV: 07/12/2020 NEXT OV: 01/10/2021 PHARMACY: [place], [place]

## 2021-01-04 ENCOUNTER — Other Ambulatory Visit (INDEPENDENT_AMBULATORY_CARE_PROVIDER_SITE_OTHER): Payer: HMO

## 2021-01-04 ENCOUNTER — Other Ambulatory Visit: Payer: Self-pay

## 2021-01-04 DIAGNOSIS — Z125 Encounter for screening for malignant neoplasm of prostate: Secondary | ICD-10-CM

## 2021-01-04 DIAGNOSIS — E118 Type 2 diabetes mellitus with unspecified complications: Secondary | ICD-10-CM | POA: Diagnosis not present

## 2021-01-04 DIAGNOSIS — E1165 Type 2 diabetes mellitus with hyperglycemia: Secondary | ICD-10-CM

## 2021-01-04 DIAGNOSIS — E785 Hyperlipidemia, unspecified: Secondary | ICD-10-CM

## 2021-01-04 DIAGNOSIS — D696 Thrombocytopenia, unspecified: Secondary | ICD-10-CM | POA: Diagnosis not present

## 2021-01-04 DIAGNOSIS — IMO0002 Reserved for concepts with insufficient information to code with codable children: Secondary | ICD-10-CM

## 2021-01-04 LAB — COMPREHENSIVE METABOLIC PANEL
ALT: 21 U/L (ref 0–53)
AST: 14 U/L (ref 0–37)
Albumin: 4 g/dL (ref 3.5–5.2)
Alkaline Phosphatase: 58 U/L (ref 39–117)
BUN: 18 mg/dL (ref 6–23)
CO2: 28 mEq/L (ref 19–32)
Calcium: 9.7 mg/dL (ref 8.4–10.5)
Chloride: 105 mEq/L (ref 96–112)
Creatinine, Ser: 1.13 mg/dL (ref 0.40–1.50)
GFR: 63.62 mL/min (ref >60.00)
Glucose, Bld: 217 mg/dL — ABNORMAL HIGH (ref 70–99)
Potassium: 4.5 mEq/L (ref 3.5–5.1)
Sodium: 141 mEq/L (ref 135–145)
Total Bilirubin: 0.6 mg/dL (ref 0.2–1.2)
Total Protein: 6.3 g/dL (ref 6.0–8.3)

## 2021-01-04 LAB — CBC WITH DIFFERENTIAL/PLATELET
Basophils Absolute: 0.1 10*3/uL (ref 0.0–0.1)
Basophils Relative: 1.3 % (ref 0.0–3.0)
Eosinophils Absolute: 0.5 10*3/uL (ref 0.0–0.7)
Eosinophils Relative: 7.8 % — ABNORMAL HIGH (ref 0.0–5.0)
HCT: 37.3 % — ABNORMAL LOW (ref 39.0–52.0)
Hemoglobin: 12.9 g/dL — ABNORMAL LOW (ref 13.0–17.0)
Lymphocytes Relative: 26.8 % (ref 12.0–46.0)
Lymphs Abs: 1.8 10*3/uL (ref 0.7–4.0)
MCHC: 34.5 g/dL (ref 30.0–36.0)
MCV: 94.7 fl (ref 78.0–100.0)
Monocytes Absolute: 0.6 10*3/uL (ref 0.1–1.0)
Monocytes Relative: 9 % (ref 3.0–12.0)
Neutro Abs: 3.8 10*3/uL (ref 1.4–7.7)
Neutrophils Relative %: 55.1 % (ref 43.0–77.0)
Platelets: 126 10*3/uL — ABNORMAL LOW (ref 150.0–400.0)
RBC: 3.94 Mil/uL — ABNORMAL LOW (ref 4.22–5.81)
RDW: 14.6 % (ref 11.5–15.5)
WBC: 6.8 10*3/uL (ref 4.0–10.5)

## 2021-01-04 LAB — LIPID PANEL
Cholesterol: 72 mg/dL (ref 0–200)
HDL: 28.4 mg/dL — ABNORMAL LOW (ref >39.00)
LDL Cholesterol: 19 mg/dL (ref 0–99)
NonHDL: 43.88
Total CHOL/HDL Ratio: 3
Triglycerides: 125 mg/dL (ref 0.0–149.0)
VLDL: 25 mg/dL (ref 0.0–40.0)

## 2021-01-04 LAB — PSA: PSA: 0.52 ng/mL (ref 0.10–4.00)

## 2021-01-04 LAB — HEMOGLOBIN A1C: Hgb A1c MFr Bld: 8.1 % — ABNORMAL HIGH (ref 4.6–6.5)

## 2021-01-10 ENCOUNTER — Encounter: Payer: Self-pay | Admitting: Family Medicine

## 2021-01-10 ENCOUNTER — Ambulatory Visit (INDEPENDENT_AMBULATORY_CARE_PROVIDER_SITE_OTHER): Payer: HMO | Admitting: Family Medicine

## 2021-01-10 ENCOUNTER — Ambulatory Visit: Payer: PPO

## 2021-01-10 ENCOUNTER — Other Ambulatory Visit: Payer: Self-pay

## 2021-01-10 VITALS — BP 146/58 | HR 48 | Temp 96.9°F | Ht 67.75 in | Wt 236.4 lb

## 2021-01-10 DIAGNOSIS — IMO0002 Reserved for concepts with insufficient information to code with codable children: Secondary | ICD-10-CM

## 2021-01-10 DIAGNOSIS — I771 Stricture of artery: Secondary | ICD-10-CM

## 2021-01-10 DIAGNOSIS — Z0000 Encounter for general adult medical examination without abnormal findings: Secondary | ICD-10-CM | POA: Diagnosis not present

## 2021-01-10 DIAGNOSIS — I2510 Atherosclerotic heart disease of native coronary artery without angina pectoris: Secondary | ICD-10-CM

## 2021-01-10 DIAGNOSIS — E785 Hyperlipidemia, unspecified: Secondary | ICD-10-CM

## 2021-01-10 DIAGNOSIS — E66811 Obesity, class 1: Secondary | ICD-10-CM

## 2021-01-10 DIAGNOSIS — J432 Centrilobular emphysema: Secondary | ICD-10-CM

## 2021-01-10 DIAGNOSIS — F17200 Nicotine dependence, unspecified, uncomplicated: Secondary | ICD-10-CM

## 2021-01-10 DIAGNOSIS — H409 Unspecified glaucoma: Secondary | ICD-10-CM

## 2021-01-10 DIAGNOSIS — D696 Thrombocytopenia, unspecified: Secondary | ICD-10-CM

## 2021-01-10 DIAGNOSIS — D229 Melanocytic nevi, unspecified: Secondary | ICD-10-CM

## 2021-01-10 DIAGNOSIS — E1121 Type 2 diabetes mellitus with diabetic nephropathy: Secondary | ICD-10-CM

## 2021-01-10 DIAGNOSIS — I700 Atherosclerosis of aorta: Secondary | ICD-10-CM

## 2021-01-10 DIAGNOSIS — K219 Gastro-esophageal reflux disease without esophagitis: Secondary | ICD-10-CM

## 2021-01-10 DIAGNOSIS — E669 Obesity, unspecified: Secondary | ICD-10-CM

## 2021-01-10 DIAGNOSIS — E1165 Type 2 diabetes mellitus with hyperglycemia: Secondary | ICD-10-CM

## 2021-01-10 DIAGNOSIS — G4733 Obstructive sleep apnea (adult) (pediatric): Secondary | ICD-10-CM

## 2021-01-10 DIAGNOSIS — Z7189 Other specified counseling: Secondary | ICD-10-CM

## 2021-01-10 MED ORDER — METFORMIN HCL 1000 MG PO TABS: 1000.0000 mg | ORAL_TABLET | Freq: Two times a day (BID) | ORAL | 3 refills | Status: DC

## 2021-01-10 MED ORDER — PIOGLITAZONE HCL 15 MG PO TABS: 15.0000 mg | ORAL_TABLET | Freq: Every day | ORAL | 3 refills | Status: DC

## 2021-01-10 MED ORDER — CLOPIDOGREL BISULFATE 75 MG PO TABS: 75.0000 mg | ORAL_TABLET | Freq: Every day | ORAL | 3 refills | Status: DC

## 2021-01-10 MED ORDER — OMEGA-3-ACID ETHYL ESTERS 1 G PO CAPS: 2.0000 | ORAL_CAPSULE | Freq: Two times a day (BID) | ORAL | 3 refills | Status: DC

## 2021-01-10 MED ORDER — GLIMEPIRIDE 4 MG PO TABS: 4.0000 mg | ORAL_TABLET | Freq: Every day | ORAL | 3 refills | Status: DC

## 2021-01-10 MED ORDER — PIOGLITAZONE HCL 30 MG PO TABS: 30.0000 mg | ORAL_TABLET | Freq: Every day | ORAL | 3 refills | Status: DC

## 2021-01-10 MED ORDER — LISINOPRIL 5 MG PO TABS: 5.0000 mg | ORAL_TABLET | Freq: Every day | ORAL | 3 refills | Status: DC

## 2021-01-10 NOTE — Assessment & Plan Note (Signed)

## 2021-01-10 NOTE — Assessment & Plan Note (Signed)
Chronic, adequate. Continue current regimen.  

## 2021-01-10 NOTE — Assessment & Plan Note (Signed)
Stable period off respiratory medication, continued smoker.

## 2021-01-10 NOTE — Assessment & Plan Note (Addendum)
Preventative protocols reviewed and updated unless pt declined. Discussed healthy diet and lifestyle.  Will stop prostate cancer screening.

## 2021-01-10 NOTE — Patient Instructions (Addendum)
We will repeat carotid ultrasound.  We will refer you back to dermatology to check spot on head.  Work on diabetic diet. Increase actos to 30mg  dialy.  Good to see you today. Call us with questions.  Return in 6 months for diabetes follow up visit.   Health Maintenance After Age 76 After age 41, you are at a higher risk for certain long-term diseases and infections as well as injuries from falls. Falls are a major cause of broken bones and head injuries in people who are older than age 73. Getting regular preventive care can help to keep you healthy and well. Preventive care includes getting regular testing and making lifestyle changes as recommended by your health care provider. Talk with your health care provider about:  Which screenings and tests you should have. A screening is a test that checks for a disease when you have no symptoms.  A diet and exercise plan that is right for you. What should I know about screenings and tests to prevent falls? Screening and testing are the best ways to find a health problem early. Early diagnosis and treatment give you the best chance of managing medical conditions that are common after age 58. Certain conditions and lifestyle choices may make you more likely to have a fall. Your health care provider may recommend:  Regular vision checks. Poor vision and conditions such as cataracts can make you more likely to have a fall. If you wear glasses, make sure to get your prescription updated if your vision changes.  Medicine review. Work with your health care provider to regularly review all of the medicines you are taking, including over-the-counter medicines. Ask your health care provider about any side effects that may make you more likely to have a fall. Tell your health care provider if any medicines that you take make you feel dizzy or sleepy.  Osteoporosis screening. Osteoporosis is a condition that causes the bones to get weaker. This can make the bones  weak and cause them to break more easily.  Blood pressure screening. Blood pressure changes and medicines to control blood pressure can make you feel dizzy.  Strength and balance checks. Your health care provider may recommend certain tests to check your strength and balance while standing, walking, or changing positions.  Foot health exam. Foot pain and numbness, as well as not wearing proper footwear, can make you more likely to have a fall.  Depression screening. You may be more likely to have a fall if you have a fear of falling, feel emotionally low, or feel unable to do activities that you used to do.  Alcohol use screening. Using too much alcohol can affect your balance and may make you more likely to have a fall. What actions can I take to lower my risk of falls? General instructions  Talk with your health care provider about your risks for falling. Tell your health care provider if: ? You fall. Be sure to tell your health care provider about all falls, even ones that seem minor. ? You feel dizzy, sleepy, or off-balance.  Take over-the-counter and prescription medicines only as told by your health care provider. These include any supplements.  Eat a healthy diet and maintain a healthy weight. A healthy diet includes low-fat dairy products, low-fat (lean) meats, and fiber from whole grains, beans, and lots of fruits and vegetables. Home safety  Remove any tripping hazards, such as rugs, cords, and clutter.  Install safety equipment such as grab bars in  bathrooms and safety rails on stairs.  Keep rooms and walkways well-lit. Activity  Follow a regular exercise program to stay fit. This will help you maintain your balance. Ask your health care provider what types of exercise are appropriate for you.  If you need a cane or walker, use it as recommended by your health care provider.  Wear supportive shoes that have nonskid soles.   Lifestyle  Do not drink alcohol if your  health care provider tells you not to drink.  If you drink alcohol, limit how much you have: ? 0-1 drink a day for women. ? 0-2 drinks a day for men.  Be aware of how much alcohol is in your drink. In the [place] one drink equals one typical bottle of beer (12 oz), one-half glass of wine (5 oz), or one shot of hard liquor (1 oz).  Do not use any products that contain nicotine or tobacco, such as cigarettes and e-cigarettes. If you need help quitting, ask your health care provider. Summary  Having a healthy lifestyle and getting preventive care can help to protect your health and wellness after age 57.  Screening and testing are the best way to find a health problem early and help you avoid having a fall. Early diagnosis and treatment give you the best chance for managing medical conditions that are more common for people who are older than age 61.  Falls are a major cause of broken bones and head injuries in people who are older than age 58. Take precautions to prevent a fall at home.  Work with your health care provider to learn what changes you can make to improve your health and wellness and to prevent falls. This information is not intended to replace advice given to you by your health care provider. Make sure you discuss any questions you have with your health care provider. Document Revised: 01/30/2019 Document Reviewed: 08/22/2017 Elsevier Patient Education  2021 [employer].

## 2021-01-10 NOTE — Assessment & Plan Note (Signed)
Chronic, above goal. Will increase actos to 30mg  daily. He declines injectable medication. Consider rybelsus. Jardiance previously unaffordable (went into donut hole)

## 2021-01-10 NOTE — Assessment & Plan Note (Signed)
He's stopped pantoprazole - unclear if it helped headaches.

## 2021-01-10 NOTE — Assessment & Plan Note (Addendum)
LDL at goal on zetia, lipitor, lovaza.  The ASCVD Risk score Mikey Bussing DC Jr., et al., 2013) failed to calculate for the following reasons:   The valid total cholesterol range is 130 to 320 mg/dL

## 2021-01-10 NOTE — Assessment & Plan Note (Signed)
This is followed by the [place].

## 2021-01-10 NOTE — Assessment & Plan Note (Signed)
Regularly sees [place].

## 2021-01-10 NOTE — Assessment & Plan Note (Signed)
Chronic, stable 

## 2021-01-10 NOTE — Assessment & Plan Note (Signed)
Update carotid US.  

## 2021-01-10 NOTE — Assessment & Plan Note (Signed)
Refer to derm for nevus on vertex of scalp

## 2021-01-10 NOTE — Assessment & Plan Note (Signed)
Continue plavix and statin

## 2021-01-10 NOTE — Assessment & Plan Note (Signed)
Advanced directive discussion - does not want prolonged life support. Would want wife to be HCPOA. Packet previously provided. Planning to see lawyer for this.

## 2021-01-10 NOTE — Progress Notes (Signed)
Patient ID: Evan Avery, male    DOB: 03-17-1946, 75 y.o.   MRN: [id number]  This visit was conducted in person.  BP (!) 146/58 (BP Location: Right Arm, Cuff Size: Large)   Pulse (!) 48   Temp (!) 96.9 F (36.1 C) (Temporal)   Ht 5' 7.75" (1.721 m)   Wt 236 lb 7 oz (107.2 kg)   SpO2 97%   BMI 36.22 kg/m    CC: AMW Subjective:   HPI: Evan Avery is a 75 y.o. male presenting on 01/10/2021 for Medicare Wellness   Did not see health advisor this year.    Hearing Screening   125Hz  250Hz  500Hz  1000Hz  2000Hz  3000Hz  4000Hz  6000Hz  8000Hz   Right ear:           Left ear:           Comments: Pt has hearing aids  Vision Screening Comments: Vision exam in march 2022 @ [facility] Office Visit from 01/10/2021 in [place] at [facility]  PHQ-2 Total Score 0      Fall Risk  01/10/2021 01/02/2020 12/27/2018 12/14/2017 12/06/2016  Falls in the past year? 0 0 0 No No  Number falls in past yr: - 0 - - -  Injury with Fall? - 0 - - -  Risk for fall due to : - Medication side effect - - -  Risk for fall due to: Comment - - - - -  Follow up Falls evaluation completed Falls evaluation completed;Falls prevention discussed - - -    Preventative: COLONOSCOPY 08/2020 - multiple polpys removed (TA, HP), 1.5cm polyp biopsied (SSP), colon lipoma Ardis Hughs) COLONOSCOPY 10/2020 - 1.5cm SSP, HP, rpt 2 yrs Ardis Hughs) Prostate cancer screening - discussed, all normal, will stop.  Lung cancer screening - undergoing first done 03/2019. has decided to stop.  Flu shotyearly  COVID vaccine Pfizer 11/2019, 12/2019 Tdap 2014  Pneumovax 2014, prevnar 2015  shingrix - discussed, declines. Never had chicken pox.  Advanced directive discussion - does not want prolonged life support. Would want wife to be HCPOA. Packet previously provided. Planning to see lawyer for this.  Seat belt use discussed  Sunscreen use discussed, dark spot on scalp. Ex smoker - quit 04/2016- restarted 1 ppd.50+  PY Alcohol - rare Dentist q6 mo - upper dentures Eye exam Q3-4 mo through [place] - glaucoma  Bowel - no constipation  Bladder - no incontinence   Lives with wife and son Occupation: retired, was Multimedia programmer for [employer] Activity: no regular exercisedue to foot pain Diet: good water, fruits/vegetables daily     Relevant past medical, surgical, family and social history reviewed and updated as indicated. Interim medical history since our last visit reviewed. Allergies and medications reviewed and updated. Outpatient Medications Prior to Visit  Medication Sig Dispense Refill  . atenolol (TENORMIN) 25 MG tablet Take 1 tablet (25 mg total) by mouth 2 (two) times daily. 180 tablet 3  . atorvastatin (LIPITOR) 40 MG tablet TAKE 1 TABLET BY MOUTH ONCE DAILY AT  6PM 90 tablet 0  . Brinzolamide-Brimonidine 1-0.2 % SUSP Apply 1 drop to eye 3 (three) times daily.     . cetirizine (ZYRTEC) 10 MG tablet Take 10 mg by mouth daily as needed for allergies.    . Coenzyme Q10 (COQ10) 200 MG CAPS Take 200 mg by mouth daily.    Marland Kitchen ezetimibe (ZETIA) 10 MG tablet Take 1 tablet by mouth once daily 90 tablet 2  .  famotidine (PEPCID) 40 MG tablet Take 1 tablet (40 mg total) by mouth 2 (two) times daily. 60 tablet 11  . fluticasone (FLONASE) 50 MCG/ACT nasal spray Place 2 sprays into both nostrils daily. 16 g 1  . latanoprost (XALATAN) 0.005 % ophthalmic solution Place 1 drop into both eyes at bedtime.    . nitroGLYCERIN (NITROSTAT) 0.4 MG SL tablet Place 1 tablet (0.4 mg total) under the tongue every 5 (five) minutes as needed for chest pain. 30 tablet 3  . senna-docusate (SENOKOT-S) 8.6-50 MG tablet Take 1 tablet by mouth at bedtime as needed for mild constipation. 15 tablet 0  . clopidogrel (PLAVIX) 75 MG tablet Take 1 tablet by mouth once daily 90 tablet 3  . glimepiride (AMARYL) 4 MG tablet Take 1 tablet by mouth once daily with breakfast (Patient taking differently: TAKE 1 TABLET BY MOUTH ONCE DAILY WITH   BREAKFAST) 90 tablet 1  . lisinopril (ZESTRIL) 5 MG tablet Take 1 tablet by mouth once daily 90 tablet 3  . metFORMIN (GLUCOPHAGE) 1000 MG tablet Take 1 tablet (1,000 mg total) by mouth 2 (two) times daily with a meal. 180 tablet 3  . omega-3 acid ethyl esters (LOVAZA) 1 g capsule Take 2 capsules by mouth twice daily 360 capsule 3  . pioglitazone (ACTOS) 15 MG tablet Take 1 tablet by mouth once daily 90 tablet 0   No facility-administered medications prior to visit.     Per HPI unless specifically indicated in ROS section below Review of Systems  Constitutional: Negative for activity change, appetite change, chills, fatigue, fever and unexpected weight change.  HENT: Positive for congestion (allergies). Negative for hearing loss.   Eyes: Negative for visual disturbance.  Respiratory: Negative for cough, chest tightness, shortness of breath and wheezing.   Cardiovascular: Negative for chest pain, palpitations and leg swelling.  Gastrointestinal: Negative for abdominal distention, abdominal pain, blood in stool, constipation, diarrhea, nausea and vomiting.  Genitourinary: Negative for difficulty urinating and hematuria.  Musculoskeletal: Negative for arthralgias, myalgias and neck pain.  Skin: Negative for rash.  Neurological: Positive for headaches (occasional). Negative for dizziness, seizures and syncope.  Hematological: Negative for adenopathy. Does not bruise/bleed easily.  Psychiatric/Behavioral: Negative for dysphoric mood. The patient is not nervous/anxious.    Objective:  BP (!) 146/58 (BP Location: Right Arm, Cuff Size: Large)   Pulse (!) 48   Temp (!) 96.9 F (36.1 C) (Temporal)   Ht 5' 7.75" (1.721 m)   Wt 236 lb 7 oz (107.2 kg)   SpO2 97%   BMI 36.22 kg/m   Wt Readings from Last 3 Encounters:  01/10/21 236 lb 7 oz (107.2 kg)  12/20/20 235 lb (106.6 kg)  11/11/20 230 lb (104.3 kg)      Physical Exam Vitals and nursing note reviewed.  Constitutional:      General:  He is not in acute distress.    Appearance: Normal appearance. He is well-developed. He is not ill-appearing.  HENT:     Head: Normocephalic and atraumatic.     Right Ear: Hearing, tympanic membrane, ear canal and external ear normal.     Left Ear: Hearing, tympanic membrane, ear canal and external ear normal.  Eyes:     General: No scleral icterus.    Extraocular Movements: Extraocular movements intact.     Conjunctiva/sclera: Conjunctivae normal.     Pupils: Pupils are equal, round, and reactive to light.  Neck:     Thyroid: No thyroid mass or thyromegaly.  Vascular: Carotid bruit (right) present.  Cardiovascular:     Rate and Rhythm: Normal rate and regular rhythm.     Pulses: Normal pulses.          Radial pulses are 2+ on the right side and 2+ on the left side.     Heart sounds: Normal heart sounds. No murmur heard.   Pulmonary:     Effort: Pulmonary effort is normal. No respiratory distress.     Breath sounds: Normal breath sounds. No wheezing, rhonchi or rales.  Abdominal:     General: Abdomen is flat. Bowel sounds are normal. There is no distension.     Palpations: Abdomen is soft. There is no mass.     Tenderness: There is no abdominal tenderness. There is no guarding or rebound.     Hernia: A hernia (incisional periumbilical hernia) is present.  Musculoskeletal:        General: Normal range of motion.     Cervical back: Normal range of motion and neck supple.     Right lower leg: No edema.     Left lower leg: No edema.  Lymphadenopathy:     Cervical: No cervical adenopathy.  Skin:    General: Skin is warm and dry.     Findings: No rash.     Comments: Irregular mole on vertex of skull  Neurological:     General: No focal deficit present.     Mental Status: He is alert and oriented to person, place, and time.     Comments:  CN grossly intact, station and gait intact Recall 3/3 Calculation 5/5 DLROW  Psychiatric:        Mood and Affect: Mood normal.         Behavior: Behavior normal.        Thought Content: Thought content normal.        Judgment: Judgment normal.       Results for orders placed or performed in visit on 01/04/21  CBC with Differential/Platelet  Result Value Ref Range   WBC 6.8 4.0 - 10.5 K/uL   RBC 3.94 (L) 4.22 - 5.81 Mil/uL   Hemoglobin 12.9 (L) 13.0 - 17.0 g/dL   HCT 37.3 (L) 39.0 - 52.0 %   MCV 94.7 78.0 - 100.0 fl   MCHC 34.5 30.0 - 36.0 g/dL   RDW 14.6 11.5 - 15.5 %   Platelets 126.0 (L) 150.0 - 400.0 K/uL   Neutrophils Relative % 55.1 43.0 - 77.0 %   Lymphocytes Relative 26.8 12.0 - 46.0 %   Monocytes Relative 9.0 3.0 - 12.0 %   Eosinophils Relative 7.8 (H) 0.0 - 5.0 %   Basophils Relative 1.3 0.0 - 3.0 %   Neutro Abs 3.8 1.4 - 7.7 K/uL   Lymphs Abs 1.8 0.7 - 4.0 K/uL   Monocytes Absolute 0.6 0.1 - 1.0 K/uL   Eosinophils Absolute 0.5 0.0 - 0.7 K/uL   Basophils Absolute 0.1 0.0 - 0.1 K/uL  PSA  Result Value Ref Range   PSA 0.52 0.10 - 4.00 ng/mL  Hemoglobin A1c  Result Value Ref Range   Hgb A1c MFr Bld 8.1 (H) 4.6 - 6.5 %  Comprehensive metabolic panel  Result Value Ref Range   Sodium 141 135 - 145 mEq/L   Potassium 4.5 3.5 - 5.1 mEq/L   Chloride 105 96 - 112 mEq/L   CO2 28 19 - 32 mEq/L   Glucose, Bld 217 (H) 70 - 99 mg/dL   BUN 18 6 -  23 mg/dL   Creatinine, Ser 1.13 0.40 - 1.50 mg/dL   Total Bilirubin 0.6 0.2 - 1.2 mg/dL   Alkaline Phosphatase 58 39 - 117 U/L   AST 14 0 - 37 U/L   ALT 21 0 - 53 U/L   Total Protein 6.3 6.0 - 8.3 g/dL   Albumin 4.0 3.5 - 5.2 g/dL   GFR 63.62 >60.00 mL/min   Calcium 9.7 8.4 - 10.5 mg/dL  Lipid panel  Result Value Ref Range   Cholesterol 72 0 - 200 mg/dL   Triglycerides 125.0 0.0 - 149.0 mg/dL   HDL 28.40 (L) >39.00 mg/dL   VLDL 25.0 0.0 - 40.0 mg/dL   LDL Cholesterol 19 0 - 99 mg/dL   Total CHOL/HDL Ratio 3    NonHDL 43.88    Assessment & Plan:  This visit occurred during the SARS-CoV-2 public health emergency.  Safety protocols were in place, including  screening questions prior to the visit, additional usage of staff PPE, and extensive cleaning of exam room while observing appropriate contact time as indicated for disinfecting solutions.   Problem List Items Addressed This Visit    Diabetes mellitus type 2, uncontrolled, with complications ([place])    Chronic, above goal. Will increase actos to 30mg  daily. He declines injectable medication. Consider rybelsus. Jardiance previously unaffordable (went into donut hole)      Relevant Medications   glimepiride (AMARYL) 4 MG tablet   metFORMIN (GLUCOPHAGE) 1000 MG tablet   lisinopril (ZESTRIL) 5 MG tablet   pioglitazone (ACTOS) 30 MG tablet   OSA on CPAP    [facility]       Thrombocytopenia (HCC)    Chronic, stable.       Hyperlipidemia with target LDL less than 70    LDL at goal on zetia, lipitor, lovaza.  The ASCVD Risk score Mikey Bussing DC Jr., et al., 2013) failed to calculate for the following reasons:   The valid total cholesterol range is 130 to 320 mg/dL       Relevant Medications   omega-3 acid ethyl esters (LOVAZA) 1 g capsule   lisinopril (ZESTRIL) 5 MG tablet   Obesity, Class I, BMI 30.0-34.9 (see actual BMI)    Chronic, adequate. Continue current regimen.       CAD in native artery   Relevant Medications   omega-3 acid ethyl esters (LOVAZA) 1 g capsule   lisinopril (ZESTRIL) 5 MG tablet   Health maintenance examination    Preventative protocols reviewed and updated unless pt declined. Discussed healthy diet and lifestyle.  Will stop prostate cancer screening.       Medicare annual wellness visit, subsequent - Primary    I have personally reviewed the Medicare Annual Wellness questionnaire and have noted 1. The patient's medical and social history 2. Their use of alcohol, tobacco or illicit drugs 3. Their current medications and supplements 4. The patient's functional ability including ADL's, fall risks, home safety risks and hearing or visual  impairment. Cognitive function has been assessed and addressed as indicated.  5. Diet and physical activity 6. Evidence for depression or mood disorders The patients weight, height, BMI have been recorded in the chart. I have made referrals, counseling and provided education to the patient based on review of the above and I have provided the pt with a written personalized care plan for preventive services. Provider list updated.. See scanned questionairre as needed for further documentation. Reviewed preventative protocols and updated unless pt declined.  Advanced care planning/counseling discussion    Advanced directive discussion - does not want prolonged life support. Would want wife to be HCPOA. Packet previously provided. Planning to see lawyer for this.       Smoker    Continue to encourage cessation. Declines return to lung cancer screen.       Glaucoma    Regularly sees [place].       Thoracic aorta atherosclerosis (HCC)    Continue plavix and statin.       Relevant Medications   omega-3 acid ethyl esters (LOVAZA) 1 g capsule   lisinopril (ZESTRIL) 5 MG tablet   COPD (chronic obstructive pulmonary disease) (HCC)    Stable period off respiratory medication, continued smoker.       GERD (gastroesophageal reflux disease)    He's stopped pantoprazole - unclear if it helped headaches.       Atypical nevus    Refer to derm for nevus on vertex of scalp      Relevant Orders   Ambulatory referral to Dermatology   Stenosis of right subclavian artery (HCC)    Update carotid US       Relevant Medications   omega-3 acid ethyl esters (LOVAZA) 1 g capsule   lisinopril (ZESTRIL) 5 MG tablet   Other Relevant Orders   VAS US CAROTID   Type 2 diabetes mellitus with diabetic nephropathy, without long-term current use of insulin (HCC)   Relevant Medications   glimepiride (AMARYL) 4 MG tablet   metFORMIN (GLUCOPHAGE) 1000 MG tablet   lisinopril (ZESTRIL) 5 MG tablet    pioglitazone (ACTOS) 30 MG tablet       Meds ordered this encounter  Medications  . omega-3 acid ethyl esters (LOVAZA) 1 g capsule    Sig: Take 2 capsules (2 g total) by mouth 2 (two) times daily.    Dispense:  360 capsule    Refill:  3  . clopidogrel (PLAVIX) 75 MG tablet    Sig: Take 1 tablet (75 mg total) by mouth daily.    Dispense:  90 tablet    Refill:  3  . DISCONTD: pioglitazone (ACTOS) 15 MG tablet    Sig: Take 1 tablet (15 mg total) by mouth daily.    Dispense:  90 tablet    Refill:  3  . glimepiride (AMARYL) 4 MG tablet    Sig: Take 1 tablet (4 mg total) by mouth daily with breakfast.    Dispense:  90 tablet    Refill:  3  . metFORMIN (GLUCOPHAGE) 1000 MG tablet    Sig: Take 1 tablet (1,000 mg total) by mouth 2 (two) times daily with a meal.    Dispense:  180 tablet    Refill:  3  . lisinopril (ZESTRIL) 5 MG tablet    Sig: Take 1 tablet (5 mg total) by mouth daily.    Dispense:  90 tablet    Refill:  3  . pioglitazone (ACTOS) 30 MG tablet    Sig: Take 1 tablet (30 mg total) by mouth daily.    Dispense:  90 tablet    Refill:  3    Note new dose   Orders Placed This Encounter  Procedures  . Ambulatory referral to Dermatology    Referral Priority:   Routine    Referral Type:   Consultation    Referral Reason:   Specialty Services Required    Requested Specialty:   Dermatology    Number of Visits Requested:   1  Patient instructions: We will repeat carotid ultrasound.  We will refer you back to dermatology to check spot on head.  Work on diabetic diet. Increase actos to 30mg  dialy.  Good to see you today. Call us with questions.  Return in 6 months for diabetes follow up visit.   Follow up plan: Return in about 6 months (around 07/13/2021) for follow up visit.  [name], MD

## 2021-01-10 NOTE — Assessment & Plan Note (Signed)
Continue to encourage cessation. Declines return to lung cancer screen.

## 2021-01-11 ENCOUNTER — Telehealth: Payer: Self-pay

## 2021-01-11 DIAGNOSIS — I771 Stricture of artery: Secondary | ICD-10-CM

## 2021-01-11 NOTE — Telephone Encounter (Signed)
Dr Darnell Level, I wanted to let you know that I spoke with patient today about scheduling Carotid US and he said he thought he told you that he wanted to wait on that. I advised patient that I would let you know and I cancelled the order so the cardiology office does not call him about this. Thank you.

## 2021-01-11 NOTE — Addendum Note (Signed)
Addended by: Kris Mouton on: 01/11/2021 12:06 PM   Modules accepted: Orders

## 2021-01-11 NOTE — Telephone Encounter (Signed)
Noted  

## 2021-01-27 DIAGNOSIS — L821 Other seborrheic keratosis: Secondary | ICD-10-CM | POA: Diagnosis not present

## 2021-01-27 DIAGNOSIS — D225 Melanocytic nevi of trunk: Secondary | ICD-10-CM | POA: Diagnosis not present

## 2021-03-01 ENCOUNTER — Telehealth: Payer: Self-pay

## 2021-03-01 NOTE — Chronic Care Management (AMB) (Addendum)
Chronic Care Management Pharmacy Assistant   Name: Evan Avery  MRN: [id number] DOB: 04/18/1946   Reason for Encounter: Disease State   Conditions to be addressed/monitored: DMII   Recent office visits:  01/10/2021  Dr.Gutierrez, PCP- Increased Actos to 30 mg once daily. F/u 6 months.  Recent consult visits:  01/03/2021  [facility] - Continue eye drops. F/u 4 months.  Hospital visits:  None in previous 6 months  Medications: Outpatient Encounter Medications as of 03/01/2021  Medication Sig Note   atenolol (TENORMIN) 25 MG tablet Take 1 tablet (25 mg total) by mouth 2 (two) times daily.    atorvastatin (LIPITOR) 40 MG tablet TAKE 1 TABLET BY MOUTH ONCE DAILY AT  6PM    Brinzolamide-Brimonidine 1-0.2 % SUSP Apply 1 drop to eye 3 (three) times daily.     cetirizine (ZYRTEC) 10 MG tablet Take 10 mg by mouth daily as needed for allergies.    clopidogrel (PLAVIX) 75 MG tablet Take 1 tablet (75 mg total) by mouth daily.    Coenzyme Q10 (COQ10) 200 MG CAPS Take 200 mg by mouth daily.    ezetimibe (ZETIA) 10 MG tablet Take 1 tablet by mouth once daily    famotidine (PEPCID) 40 MG tablet Take 1 tablet (40 mg total) by mouth 2 (two) times daily.    fluticasone (FLONASE) 50 MCG/ACT nasal spray Place 2 sprays into both nostrils daily.    glimepiride (AMARYL) 4 MG tablet Take 1 tablet (4 mg total) by mouth daily with breakfast.    latanoprost (XALATAN) 0.005 % ophthalmic solution Place 1 drop into both eyes at bedtime.    lisinopril (ZESTRIL) 5 MG tablet Take 1 tablet (5 mg total) by mouth daily.    metFORMIN (GLUCOPHAGE) 1000 MG tablet Take 1 tablet (1,000 mg total) by mouth 2 (two) times daily with a meal.    nitroGLYCERIN (NITROSTAT) 0.4 MG SL tablet Place 1 tablet (0.4 mg total) under the tongue every 5 (five) minutes as needed for chest pain. 07/16/2020: On hand   omega-3 acid ethyl esters (LOVAZA) 1 g capsule Take 2 capsules (2 g total) by mouth 2 (two) times daily.     pioglitazone (ACTOS) 30 MG tablet Take 1 tablet (30 mg total) by mouth daily.    senna-docusate (SENOKOT-S) 8.6-50 MG tablet Take 1 tablet by mouth at bedtime as needed for mild constipation.    No facility-administered encounter medications on file as of 03/01/2021.   Recent Relevant Labs: Lab Results  Component Value Date/Time   HGBA1C 8.1 (H) 01/04/2021 07:57 AM   HGBA1C 7.4 (A) 07/12/2020 08:02 AM   HGBA1C 8.2 03/12/2020 12:00 AM   HGBA1C 9.2 (H) 01/02/2020 08:35 AM   HGBA1C 8.4 02/12/2019 12:00 AM   MICROALBUR 0.2 12/27/2007 10:00 AM    Kidney Function Lab Results  Component Value Date/Time   CREATININE 1.13 01/04/2021 07:57 AM   CREATININE 1.09 09/24/2020 10:02 AM   CREATININE 0.96 03/13/2016 08:03 AM   GFR 63.62 01/04/2021 07:57 AM   GFRNONAA >60 05/15/2018 03:27 PM   GFRAA >60 05/15/2018 03:27 PM    Current antihyperglycemic regimen:  Glimepiride 4 mg - 1 tablet daily Metformin 1000 mg - 1 tablet twice daily  Actos 30 mg- 1 tablet daily    Patient verbally confirms he is taking the above medications as directed. Yes the patient agreed with the current meds and doses  What recent interventions/DTPs have been made to improve glycemic control: Per PCP visit 12/2019 -  will increase actos to 30mg  daily. He declines injectable medication. Consider rybelsus. Jardiance previously unaffordable (went into donut hole)  Have there been any recent hospitalizations or ED visits since last visit with CPP? No  Patient denies hypoglycemic symptoms, including Pale, Sweaty, Shaky, Hungry, Nervous/irritable and Vision changes  Patient denies hyperglycemic symptoms, including blurry vision, excessive thirst, fatigue, polyuria and weakness  How often are you checking your blood sugar? in the morning before eating or drinking however, this is infrequent and we discussed the need to take BG's more often. What are your blood sugars ranging?  Fasting: 03/02/2021   171    03/07/2021   200  On  insulin? No  During the week, how often does your blood glucose drop below 70? Never Are you checking your feet daily/regularly? Yes  Adherence Review: Is the patient currently on a STATIN medication? Yes Is the patient currently on ACE/ARB medication? Yes Does the patient have >5 day gap between last estimated fill dates? No  Star Rating Drugs:  Medication:  Last Fill: Day Supply atorastatin 40mg  12/08/2020 90ds Lisinopril 5mg   01/10/2021 90ds Metformin 1000mg  01/01/2021 90ds actos 30mg   01/10/2021 90ds Glimepiride 4mg  12/07/2020 90ds  Follow-Up:  Pharmacist Review  [name], CPP notified  CPA Time: 36 min [place] Clinical Pharmacy Assistant [phone]  I have reviewed the care management and care coordination activities outlined in this encounter and I am certifying that I agree with the content of this note. See addendum.  CPP Time: 10 min [name], PharmD Clinical Pharmacist [facility] at [facility] [phone]

## 2021-03-07 ENCOUNTER — Other Ambulatory Visit: Payer: Self-pay | Admitting: Family Medicine

## 2021-03-07 ENCOUNTER — Other Ambulatory Visit: Payer: Self-pay | Admitting: Cardiovascular Disease

## 2021-03-11 NOTE — Telephone Encounter (Signed)
Please contact patient to see if he would be interested in applying for patient assistance for Jardiance - he was previously on this medication and doing better but cost was a concern. He had planned to discuss this with the [facility] at last CCM visit 08/2020.  Schedule CCM visit follow up with me for June 2022.  [name], PharmD Clinical Pharmacist [facility] at [facility] [phone]

## 2021-03-14 ENCOUNTER — Telehealth: Payer: Self-pay | Admitting: *Deleted

## 2021-03-14 NOTE — Telephone Encounter (Signed)
   Primary Cardiologist: Shelva Majestic, MD  Chart reviewed as part of pre-operative protocol coverage.   75 y.o. male with . Coronary artery disease  o S/p multiple PCIs; total of 4 stents in RCA o Myoview 7/21: inf scar, no ischemia . Hypertension  . Hyperlipidemia  . Diabetes mellitus  . OSA  . Bradycardia  . +Cigs  Last OV:  12/20/20 with Dr. [name] Procedure:  Toenail extraction Rx:  Hold Plavix  RCRI:  Perioperative Risk of Major Cardiac Event is (%): 0.9 (low risk)    Richardson Dopp, PA-C 03/14/2021, 10:30 AM

## 2021-03-14 NOTE — Telephone Encounter (Signed)
Will ask Dr. [name] to make recommendations for holding Plavix.  Dr. [name] -  His med list shows he is taking Plavix alone. Can he hold Plavix for his upcoming procedure? Do you want him to be on ASA 81 mg?  Please route back to P CV DIV PREOP  Richardson Dopp, PA-C    03/14/2021 10:32 AM

## 2021-03-14 NOTE — Telephone Encounter (Signed)
   [facility] Pre-operative Risk Assessment    Patient Name: Evan Avery  DOB: 05-Sep-1946  MRN: [id number]   HEARTCARE STAFF: - Please ensure there is not already an duplicate clearance open for this procedure. - Under Visit Info/Reason for Call, type in Other and utilize the format Clearance MM/DD/YY or Clearance TBD. Do not use dashes or single digits. - If request is for dental extraction, please clarify the # of teeth to be extracted.  Request for surgical clearance:  1. What type of surgery is being performed? Removal of both great toe nails   2. When is this surgery scheduled? 03/30/21   3. What type of clearance is required (medical clearance vs. Pharmacy clearance to hold med vs. Both)? both  4. Are there any medications that need to be held prior to surgery and how long?plavix-they need direction   5. Practice name and name of physician performing surgery? [facility]   6. What is the office phone number? [place]   7.   What is the office fax number? [phone] or [phone]  8.   Anesthesia type (None, local, MAC, general) ? Not listed   Fredia Beets 03/14/2021, 8:57 AM  _________________________________________________________________   (provider comments below)

## 2021-03-15 NOTE — Telephone Encounter (Signed)
Ria Clock is calling from [place] wanting to check on the status of this clearance. I advised her it appears we are still awaiting a response from Dr. [name]. She verbalized understanding and requested a callback with his discission stating she would also like it faxed back with the information as well.  Please advise.

## 2021-03-16 NOTE — Telephone Encounter (Signed)
Notes faxed to surgeon. This phone note will be removed from the preop pool. Richardson Dopp, PA-C  03/16/2021 3:11 PM

## 2021-03-16 NOTE — Telephone Encounter (Signed)
Okay to hold Plavix for 5 days prior to procedure

## 2021-03-16 NOTE — Telephone Encounter (Signed)
   Name: Evan Avery DOB: 18-Jun-1946  MRN: [id number]  Primary Cardiologist: Shelva Majestic, MD  Chart reviewed as part of pre-operative protocol coverage.   75 y.o. male with  Coronary artery disease  ? S/p multiple PCIs; total of 4 stents in RCA ? Myoview 7/21: inf scar, no ischemia  Hypertension   Hyperlipidemia   Diabetes mellitus   OSA   Bradycardia   +Cigs  Last OV:  12/20/20 with Dr. [name] Procedure:  Toenail extraction Rx:  Hold Plavix  RCRI:  Perioperative Risk of Major Cardiac Event is (%): 0.9 (low risk)  DASI:  Functional Capacity in METs is: 4.31 (functional status is fair )  Patient was contacted 03/16/2021 in reference to pre-operative risk assessment for pending surgery as outlined below.    Since last seen, Evan Avery has done well without chest pain, shortness of breath.  As noted, case reviewed with Dr. [name].  Recommendations: . Based on ACC/AHA guidelines, the patient is at acceptable risk for the planned procedure and may proceed without further cardiovascular testing.  Marland Kitchen Clopidogrel (Plavix) can be held for 5 days prior to the procedure and resume post op when it is felt to be safe.   Please call with questions. Richardson Dopp, PA-C 03/16/2021, 3:08 PM

## 2021-03-31 ENCOUNTER — Other Ambulatory Visit: Payer: Self-pay | Admitting: Family Medicine

## 2021-04-01 NOTE — Telephone Encounter (Signed)
Plavix Last filled:  01/01/21, #90 Last OV:  01/10/21, CPE Next OV:  07/13/21, 6 mo DM f/u

## 2021-04-14 ENCOUNTER — Telehealth: Payer: Self-pay

## 2021-04-14 NOTE — Telephone Encounter (Signed)
Received faxed Medical Clearance form from [place] for dental tx.  Placed form in Dr. Synthia Innocent box.

## 2021-04-20 NOTE — Telephone Encounter (Signed)
Faxed form.

## 2021-04-20 NOTE — Telephone Encounter (Signed)
Filled and in Lisa's box 

## 2021-04-25 ENCOUNTER — Other Ambulatory Visit: Payer: Self-pay | Admitting: Cardiovascular Disease

## 2021-04-26 ENCOUNTER — Telehealth: Payer: Self-pay | Admitting: Cardiovascular Disease

## 2021-04-26 NOTE — Telephone Encounter (Signed)
Evan Avery is calling she never received a fax back

## 2021-04-26 NOTE — Telephone Encounter (Signed)
Spoke with [facility], aware there is no clearance in the chart for dental procedure. The clearance in the chart was not done by our provider. They will refax the clearance. Fax number confirmed for them.

## 2021-04-27 ENCOUNTER — Telehealth: Payer: Self-pay | Admitting: *Deleted

## 2021-04-27 NOTE — Telephone Encounter (Addendum)
   Patient Name: Evan Avery  DOB: 20-Nov-1945  MRN: [id number]   Primary Cardiologist: Shelva Majestic, MD  Chart reviewed as part of pre-operative protocol coverage.   IF SIMPLE EXTRACTION/CLEANINGS/FILLINGS: Simple dental extractions are considered low risk procedures per guidelines and generally do not require any specific cardiac clearance. It is also generally accepted that for simple extractions, fillings and dental cleanings, there is no need to interrupt blood thinner therapy. Would avoid excessive use of epinephrine.  SBE prophylaxis is not required for the patient from a cardiac standpoint.  I will route this recommendation to the requesting party via Epic fax function and remove from pre-op pool.  Please call with questions.  Charlie Pitter, PA-C 04/27/2021, 5:01 PM

## 2021-04-27 NOTE — Telephone Encounter (Signed)
   Patient Name: Evan Avery  DOB: [birth year]  MRN: [id number]   Primary Cardiologist: Shelva Majestic, MD  Chart reviewed as part of pre-operative protocol coverage. Will route to preop callback team to assist - please clarify specific clinical question/procedure that is pending since it is difficult to make a broad recommendation since different procedures require different interventions. Thank you!  Charlie Pitter, PA-C 04/27/2021, 2:59 PM

## 2021-04-27 NOTE — Telephone Encounter (Signed)
   [facility] Pre-operative Risk Assessment    Patient Name: Evan Avery  DOB: 07/26/1946  MRN: [id number]     Request for surgical clearance:  What type of surgery is being performed? Treatment may include fillings, crowns,bridges    When is this surgery scheduled? tbd   What type of clearance is required (medical clearance vs. Pharmacy clearance to hold med vs. Both)? medical  Are there any medications that need to be held prior to surgery and how long?Plavix 75 mg   Practice name and name of physician performing surgery? [facility] ; Evan Avery    What is the office phone number? [phone]    7.   What is the office fax number? [phone]  8.   Anesthesia type (None, local, MAC, general) ? Local anesthetic ( with epinephrine)    Evan Avery 04/27/2021, 1:18 PM  _________________________________________________________________   (provider comments below)

## 2021-04-27 NOTE — Telephone Encounter (Signed)
I s/w Paulita Cradle, with Dr. Martinique Thomas's, DDS office and clarified procedure to be done. Pt will be having at this time 2 fillings and will then have 2 fillings again in the near future. I have asked when ready for the next 2 fillings if their office could please fax over a new clearance request as due to pt is on Plavix. I will update the pre op provider t this information.

## 2021-07-01 ENCOUNTER — Telehealth: Payer: Self-pay

## 2021-07-01 NOTE — Progress Notes (Addendum)
Chronic Care Management Pharmacy Assistant   Name: Evan Avery  MRN: [id number] DOB: 09/01/1946  Reason for Encounter: Diabetes Disease State   Recent office visits:  None since last CCM visit  Recent consult visits:  05/11/2021 - Patient presented at the [facility] for eye/vision check due to blur/hair over eye. Recommended artificial tears four times daily [facility] visits:  None in previous 6 months  Medications: Outpatient Encounter Medications as of 07/01/2021  Medication Sig Note   atenolol (TENORMIN) 25 MG tablet Take 1 tablet (25 mg total) by mouth 2 (two) times daily.    atorvastatin (LIPITOR) 40 MG tablet TAKE 1 TABLET BY MOUTH ONCE DAILY AT  6PM    Brinzolamide-Brimonidine 1-0.2 % SUSP Apply 1 drop to eye 3 (three) times daily.     cetirizine (ZYRTEC) 10 MG tablet Take 10 mg by mouth daily as needed for allergies.    clopidogrel (PLAVIX) 75 MG tablet Take 1 tablet by mouth once daily    Coenzyme Q10 (COQ10) 200 MG CAPS Take 200 mg by mouth daily.    ezetimibe (ZETIA) 10 MG tablet Take 1 tablet by mouth once daily    famotidine (PEPCID) 40 MG tablet Take 1 tablet (40 mg total) by mouth 2 (two) times daily.    fluticasone (FLONASE) 50 MCG/ACT nasal spray Place 2 sprays into both nostrils daily.    glimepiride (AMARYL) 4 MG tablet Take 1 tablet (4 mg total) by mouth daily with breakfast.    latanoprost (XALATAN) 0.005 % ophthalmic solution Place 1 drop into both eyes at bedtime.    lisinopril (ZESTRIL) 5 MG tablet Take 1 tablet by mouth once daily    metFORMIN (GLUCOPHAGE) 1000 MG tablet Take 1 tablet (1,000 mg total) by mouth 2 (two) times daily with a meal.    nitroGLYCERIN (NITROSTAT) 0.4 MG SL tablet Place 1 tablet (0.4 mg total) under the tongue every 5 (five) minutes as needed for chest pain. 07/16/2020: On hand   omega-3 acid ethyl esters (LOVAZA) 1 g capsule Take 2 capsules (2 g total) by mouth 2 (two) times daily.    pioglitazone (ACTOS) 30 MG tablet Take 1 tablet  (30 mg total) by mouth daily.    senna-docusate (SENOKOT-S) 8.6-50 MG tablet Take 1 tablet by mouth at bedtime as needed for mild constipation.    No facility-administered encounter medications on file as of 07/01/2021.     Recent Relevant Labs: Lab Results  Component Value Date/Time   HGBA1C 8.1 (H) 01/04/2021 07:57 AM   HGBA1C 7.4 (A) 07/12/2020 08:02 AM   HGBA1C 8.2 03/12/2020 12:00 AM   HGBA1C 9.2 (H) 01/02/2020 08:35 AM   HGBA1C 8.4 02/12/2019 12:00 AM   MICROALBUR 0.2 12/27/2007 10:00 AM    Kidney Function Lab Results  Component Value Date/Time   CREATININE 1.13 01/04/2021 07:57 AM   CREATININE 1.09 09/24/2020 10:02 AM   CREATININE 0.96 03/13/2016 08:03 AM   GFR 63.62 01/04/2021 07:57 AM   GFRNONAA >60 05/15/2018 03:27 PM   GFRAA >60 05/15/2018 03:27 PM   Several attempts made to contact patient (07/01/2021, 07/06/2021 and 07/11/2021) and was unsuccessful.   Adherence Review: Is the patient currently on a STATIN medication? Yes Is the patient currently on ACE/ARB medication? Yes Does the patient have >5 day gap between last estimated fill dates? No  Care Gaps: Annual wellness visit in last year? Yes 01/10/2021 Most recent A1C reading: 8.1 on 01/04/2021 Most Recent BP reading: 179/79 on 12/30/2020  Last  eye exam / retinopathy screening: 01/03/2021 Last diabetic foot exam: 04/12/2021  Star Rating Drugs:  Medication:  Last Fill: Day Supply Atorastatin '40mg'$  06/12/2021       90 Lisinopril '5mg'$              06/28/2021        90 Metformin '1000mg'$      06/28/2021        90 Pioglitazone '30mg'$  04/12/2021        90 Glimepiride '4mg'$  06/12/2021        90  PCP appointment on 07/13/2021  [name], CPP notified  Marijean Niemann, [place] Assistant [phone]  I have reviewed the care management and care coordination activities outlined in this encounter and I am certifying that I agree with the content of this note. No further action required.  [name],  PharmD Clinical Pharmacist [place] [facility] at [facility] [phone]

## 2021-07-13 ENCOUNTER — Ambulatory Visit: Payer: HMO | Admitting: Family Medicine

## 2021-07-15 ENCOUNTER — Other Ambulatory Visit: Payer: Self-pay | Admitting: Cardiovascular Disease

## 2021-08-11 ENCOUNTER — Telehealth: Payer: Self-pay | Admitting: Family Medicine

## 2021-08-11 NOTE — Telephone Encounter (Signed)
Spoke with pt's wife, Enid Derry (on dpr), informing her pt will need to schedule a visit.  Says she will relay that info when he returns.  She is aware Dr. Darnell Level is out of office.

## 2021-08-11 NOTE — Telephone Encounter (Signed)
Pt called in wanting medication for a sinus infection that he has

## 2021-08-15 ENCOUNTER — Other Ambulatory Visit: Payer: Self-pay | Admitting: Family Medicine

## 2021-08-26 ENCOUNTER — Ambulatory Visit (INDEPENDENT_AMBULATORY_CARE_PROVIDER_SITE_OTHER): Payer: HMO

## 2021-08-26 ENCOUNTER — Other Ambulatory Visit: Payer: Self-pay

## 2021-08-26 DIAGNOSIS — Z23 Encounter for immunization: Secondary | ICD-10-CM | POA: Diagnosis not present

## 2021-08-30 ENCOUNTER — Telehealth: Payer: Self-pay

## 2021-08-30 NOTE — Chronic Care Management (AMB) (Addendum)
    Chronic Care Management Pharmacy Assistant   Name: Evan Avery  MRN: [id number] DOB: Apr 16, 1946  Reason for Encounter: Reminder Call  Conditions to be addressed: HTN,CAD,COPD,DM II, HLD  Medications: Outpatient Encounter Medications as of 08/30/2021  Medication Sig Note   atenolol (TENORMIN) 25 MG tablet Take 1 tablet by mouth twice daily    atorvastatin (LIPITOR) 40 MG tablet TAKE 1 TABLET BY MOUTH ONCE DAILY AT  6PM    Brinzolamide-Brimonidine 1-0.2 % SUSP Apply 1 drop to eye 3 (three) times daily.     cetirizine (ZYRTEC) 10 MG tablet Take 10 mg by mouth daily as needed for allergies.    clopidogrel (PLAVIX) 75 MG tablet Take 1 tablet by mouth once daily    Coenzyme Q10 (COQ10) 200 MG CAPS Take 200 mg by mouth daily.    ezetimibe (ZETIA) 10 MG tablet Take 1 tablet by mouth once daily    famotidine (PEPCID) 40 MG tablet Take 1 tablet (40 mg total) by mouth 2 (two) times daily.    fluticasone (FLONASE) 50 MCG/ACT nasal spray Place 2 sprays into both nostrils daily.    glimepiride (AMARYL) 4 MG tablet Take 1 tablet (4 mg total) by mouth daily with breakfast.    latanoprost (XALATAN) 0.005 % ophthalmic solution Place 1 drop into both eyes at bedtime.    lisinopril (ZESTRIL) 5 MG tablet Take 1 tablet by mouth once daily    metFORMIN (GLUCOPHAGE) 1000 MG tablet Take 1 tablet (1,000 mg total) by mouth 2 (two) times daily with a meal.    nitroGLYCERIN (NITROSTAT) 0.4 MG SL tablet Place 1 tablet (0.4 mg total) under the tongue every 5 (five) minutes as needed for chest pain. 07/16/2020: On hand   omega-3 acid ethyl esters (LOVAZA) 1 g capsule Take 2 capsules (2 g total) by mouth 2 (two) times daily.    pioglitazone (ACTOS) 30 MG tablet Take 1 tablet (30 mg total) by mouth daily.    senna-docusate (SENOKOT-S) 8.6-50 MG tablet Take 1 tablet by mouth at bedtime as needed for mild constipation.    No facility-administered encounter medications on file as of 08/30/2021.   [name]   did not answer the telephone  to remind him of his upcoming telephone visit with [name] on 09/05/21 at 10:00am. Patient was reminded to have all medications, supplements and any blood glucose and blood pressure readings available for review at appointment. Detailed message left.  Star Rating Drugs: Medication:  Last Fill: Day Supply Atorastatin 40mg          06/12/2021       90 Lisinopril 5mg              06/28/2021        90 Metformin 1000mg      06/28/2021        90 Pioglitazone 30mg       07/11/2021         90 Glimepiride 4mg           06/28/2021           90  [name], CPP notified  [name], [place] Assistant [phone]  I have reviewed the care management and care coordination activities outlined in this encounter and I am certifying that I agree with the content of this note. No further action required.  [name], PharmD Clinical Pharmacist [place] [facility] at [facility] [phone]

## 2021-09-05 ENCOUNTER — Ambulatory Visit (INDEPENDENT_AMBULATORY_CARE_PROVIDER_SITE_OTHER): Payer: HMO

## 2021-09-05 ENCOUNTER — Other Ambulatory Visit: Payer: Self-pay

## 2021-09-05 DIAGNOSIS — E1169 Type 2 diabetes mellitus with other specified complication: Secondary | ICD-10-CM

## 2021-09-05 DIAGNOSIS — E785 Hyperlipidemia, unspecified: Secondary | ICD-10-CM

## 2021-09-05 DIAGNOSIS — I10 Essential (primary) hypertension: Secondary | ICD-10-CM

## 2021-09-05 NOTE — Patient Instructions (Signed)
Dear Evan Avery,  Below is a summary of the goals we discussed during our follow up appointment on September 05, 2021. Please contact me anytime with questions or concerns.   Visit Information  Patient Care Plan: CCM Pharmacy Care Plan     Problem Identified: CHL AMB "PATIENT-SPECIFIC PROBLEM"      Long-Range Goal: Disease Management   Start Date: 09/05/2021  Priority: High  Note:   Current Barriers:  Home blood pressure elevated Last A1c above goal, home blood glucose readings elevated  Pharmacist Clinical Goal(s):  Patient will achieve improvement in diet as evidenced by A1c through collaboration with PharmD and provider.   Interventions: 1:1 collaboration with [name], MD regarding development and update of comprehensive plan of care as evidenced by provider attestation and co-signature Inter-disciplinary care team collaboration (see longitudinal plan of care) Comprehensive medication review performed; medication list updated in electronic medical record  Hypertension (BP goal <130/80) -Not ideally controlled - clinic and home readings elevated -Current treatment: Atenolol 25 mg - 1 tablet twice daily Lisinopril 5 mg - 1 tablet daily  -Sleep apnea complicates, wears CPAP -Medications previously tried: History of bradycardia on atenolol, dose reduced and split to BID to reduce bradycardia; -Current home readings: this morning - 164/79, 45  weight 231 lbs; 183/77, 45 on recheck (pt has Omron BP machine, with arm cuff); will recheck this afternoon and call if still 180s. Not checking routinely. -Current dietary habits: not following any particular diet, wife tries to cook healthy meals  -Caffeine: He is currently drinking coffee during BP check, drinks coffee throughout the day (caffeinated) -Tobacco use: 1 PPD throughout the day (He is not currently interested in quitting, He does have nicotine patches on hand) -Current exercise habits: none  -Denies  hypotensive/hypertensive symptoms -Educated on BP goals and benefits of medications for prevention of heart attack, stroke and kidney damage; -Counseled to monitor BP at home daily, document, and provide log at future appointments -Recommended to continue current medication; Recheck home BP today, call this afternoon if still elevated. Follow up 1 week for BP log.  Hyperlipidemia: (LDL goal < 70) -Controlled - stable, not discussed 09/05/21 -Current treatment: Zetia 10 mg - 1 tablet daily Atorvastatin 40 mg - 1 tablet daily Lovaza 1 g - 2 capsules BID   -Medications previously tried: Lovaza   -Educated on Cholesterol goals;  -Recommended to continue current medication  Diabetes (A1c goal <7%) -Not ideally controlled, pt is due for A1c. He cancelled appt due to office relocation. Rescheduled for 10/2020. Lats visit with CCM recommended discussing Jardiance through [place] since pt not eligible for PAP. [facility]. His Actos was increased to 30 mg 12/2020 per PCP. I think we need to reconsider Jardiance through [place].  -Current medications: Metformin 1000 mg - 1 tablet BID Glimepiride 4 mg - 1 tablet daily Pioglitazone 30 mg - 1 tablet daily -Medications previously tried: Jardiance, Ozempic, Januvia - unaffordable -Current home glucose readings - checked this morning, had peach cobbler and ice cream last night  fasting glucose: 208 (09/05/21),180s usually  post prandial glucose: none, when he checks it in evening 4-5 PM, its lower  -Denies hypoglycemic/hyperglycemic symptoms -Current meal patterns: does not watch diet  breakfast: skips  lunch: first meal may be 12-3 PM, PB banana sandwich   dinner: high carb, lots of potatoes/bread snacks: more sweets than "should be", tries to cook healthy (1/4 of plate) drinks: rare sugary drinks, drinks water mostly  -Current exercise: not  much, very seldom, 2 bad knees, needs them replaced (joint pain 24/7, foot pain/plantar fascitis, see [facility] pod[facility]ry  - appt 09/21/21), its about a year since he tried injections  -Educated on A1c and blood sugar goals; Carbohydrate counting and/or plate method; Discussed limiting portions and sweets. -Counseled to check feet daily and get yearly eye exams - pt is up to date through [place] -Recommended to continue current medication; A1c check January 2022. He will try to cut back sweets. At that time, we can try Jardiance PAP if needed.  Patient Goals/Self-Care Activities Patient will:  - take medications as prescribed as evidenced by patient report and record review check glucose daily, document, and provide at future appointments check blood pressure daily for 7 days, document, and provide at future appointments  Follow Up Plan: Telephone follow up appointment with care management team member scheduled for:  CCM team will contact patient in 7 days for BP log. CCM follow up 1 month.      Patient verbalizes understanding of instructions provided today and agrees to view in [place].   [name], PharmD Clinical Pharmacist [place] [facility] at [facility] [phone]

## 2021-09-05 NOTE — Progress Notes (Signed)
Chronic Care Management Pharmacy Note  09/05/2021 Name:  Evan Avery MRN:  [id number] DOB:  [birth year]  Subjective: Evan Avery is an 75 y.o. year old male who is a primary patient of [name], MD.  The CCM team was consulted for assistance with disease management and care coordination needs.    Engaged with patient by telephone for follow up visit in response to provider referral for pharmacy case management and/or care coordination services.   Consent to Services:  The patient was given information about Chronic Care Management services, agreed to services, and gave verbal consent prior to initiation of services.  Please see initial visit note for detailed documentation.   Patient Care Team: [name], MD as PCP - General (Family Medicine) Troy Sine, MD as PCP - Cardiology (Cardiology) Troy Sine, MD as Consulting Physician (Cardiology) Jackolyn Confer, MD as Consulting Physician ([facility]) [name], [facility] as Pharmacist (Pharmacist)  Pt rescheduled diabetes visit with PCP for January due to office relocation. [facility] He sees P[facility] and [facility] at [facility]. Sees cardiology, Dr. [name], in January.   Recent office visits: 01/10/2021  Dr.Gutierrez, PCP- Increased Actos to 30 mg once daily. F/u 6 months.   Recent consult visits:  05/11/2021 - Patient presented at [facility] for eye/vision check due to blur/hair over eye. Recommended artificial tears four times daily [facility] visits:  None in previous 6 months   Objective:  Lab Results  Component Value Date   CREATININE 1.13 01/04/2021   BUN 18 01/04/2021   GFR 63.62 01/04/2021   GFRNONAA >60 05/15/2018   GFRAA >60 05/15/2018   NA 141 01/04/2021   K 4.5 01/04/2021   CALCIUM 9.7 01/04/2021   CO2 28 01/04/2021   GLUCOSE 217 (H) 01/04/2021    Lab Results  Component Value Date/Time   HGBA1C 8.1 (H) 01/04/2021 07:57 AM   HGBA1C 7.4  (A) 07/12/2020 08:02 AM   HGBA1C 8.2 03/12/2020 12:00 AM   HGBA1C 9.2 (H) 01/02/2020 08:35 AM   HGBA1C 8.4 02/12/2019 12:00 AM   GFR 63.62 01/04/2021 07:57 AM   GFR 66.56 09/24/2020 10:02 AM   MICROALBUR 0.2 12/27/2007 10:00 AM    Lab Results  Component Value Date   CHOL 72 01/04/2021   HDL 28.40 (L) 01/04/2021   LDLCALC 19 01/04/2021   LDLDIRECT 61.0 12/27/2018   TRIG 125.0 01/04/2021   CHOLHDL 3 01/04/2021   Last eye exam / retinopathy screening: 01/03/2021 Last diabetic foot exam: 04/12/2021  Hepatic Function Latest Ref Rng & Units 01/04/2021 03/12/2020 01/02/2020  Total Protein 6.0 - 8.3 g/dL 6.3 - 6.3  Albumin 3.5 - 5.2 g/dL 4.0 - 4.0  AST 0 - 37 U/L _0 ALT 0 - 53 U/L 21 35 17  Alk Phosphatase 39 - 117 U/L 58 63 64  Total Bilirubin 0.2 - 1.2 mg/dL 0.6 - 0.7  Bilirubin, Direct 0.01 - 0.4 - 0.1 -    Lab Results  Component Value Date/Time   TSH 0.90 03/12/2020 12:00 AM   TSH 1.39 02/18/2019 12:00 AM   TSH 1.113 05/16/2016 05:26 PM   TSH 1.11 03/13/2016 08:03 AM   TSH 1.13 02/28/2011 12:08 PM    CBC Latest Ref Rng & Units 01/04/2021 03/12/2020 01/02/2020  WBC 4.0 - 10.5 K/uL 6.8 8.2 7.7  Hemoglobin 13.0 - 17.0 g/dL 12.9(L) 14.0 13.6  Hematocrit 39.0 - 52.0 % 37.3(L) -  39.7  Platelets 150.0 - 400.0 K/uL 126.0(L) 133(A) 144.0(L)    Lab Results  Component Value Date/Time   VD25OH 38.51 03/12/2020 12:00 AM   VD25OH 43.03 08/09/2018 12:00 AM    Clinical ASCVD: Yes  The ASCVD Risk score (Arnett DK, et al., 2019) failed to calculate for the following reasons:   The valid total cholesterol range is 130 to 320 mg/dL    Depression screen [facility] 2/9 01/10/2021 01/02/2020 12/27/2018  Decreased Interest 0 0 0  Down, Depressed, Hopeless 0 0 0  PHQ - 2 Score 0 0 0  Altered sleeping - 0 0  Tired, decreased energy - 0 0  Change in appetite - 0 0  Feeling bad or failure about yourself  - 0 0  Trouble concentrating - 0 0  Moving slowly or fidgety/restless - 0 0  Suicidal  thoughts - 0 0  PHQ-9 Score - 0 0  Difficult doing work/chores - Not difficult at all Not difficult at all  Some recent data might be hidden   Social History   Tobacco Use  Smoking Status Every Day   Packs/day: 1.00   Years: 50.00   Pack years: 50.00   Types: Cigarettes   Start date: 10/23/1958  Smokeless Tobacco Never   BP Readings from Last 3 Encounters:  01/10/21 (!) 146/58  12/20/20 (!) 132/56  11/11/20 (!) 140/58   Pulse Readings from Last 3 Encounters:  01/10/21 (!) 48  12/20/20 (!) 49  11/11/20 (!) 49   Wt Readings from Last 3 Encounters:  01/10/21 236 lb 7 oz (107.2 kg)  12/20/20 235 lb (106.6 kg)  11/11/20 230 lb (104.3 kg)   BMI Readings from Last 3 Encounters:  01/10/21 36.22 kg/m  12/20/20 35.73 kg/m  11/11/20 36.02 kg/m    Assessment/Interventions: Review of patient past medical history, allergies, medications, health status, including review of consultants reports, laboratory and other test data, was performed as part of comprehensive evaluation and provision of chronic care management services.   SDOH:  (Social Determinants of Health) assessments and interventions performed: Yes SDOH Interventions    Flowsheet Row Most Recent Value  SDOH Interventions   Financial Strain Interventions Intervention Not Indicated      SDOH Screenings   Alcohol Screen: Not on file  Depression (PHQ2-9): Low Risk    PHQ-2 Score: 0  Financial Resource Strain: Low Risk    Difficulty of Paying Living Expenses: Not very hard  Food Insecurity: Not on file  Housing: Not on file  Physical Activity: Not on file  Social Connections: Not on file  Stress: Not on file  Tobacco Use: High Risk   Smoking Tobacco Use: Every Day   Smokeless Tobacco Use: Never   Passive Exposure: Not on file  Transportation Needs: Not on file    [place]  Allergies  Allergen Reactions   Dilaudid [Hydromorphone Hcl] Itching   Ambien [Zolpidem] Other (See Comments)    Pt states  "makes me wild"   Niacin And Related Other (See Comments)    Extreme fatigue    Medications Reviewed Today     Reviewed by [name], [facility] (Pharmacist) on 09/05/21 at [place] List Status: <None>   Medication Order Taking? Sig Documenting Provider Last Dose Status Informant  atenolol (TENORMIN) 25 MG tablet [id number]  Take 1 tablet by mouth twice daily Troy Sine, MD  Active   atorvastatin (LIPITOR) 40 MG tablet [id number]  TAKE 1 TABLET BY MOUTH ONCE DAILY AT  [facility]  Troy Sine, MD  Active   Brinzolamide-Brimonidine 1-0.2 % SUSP [id number]  Apply 1 drop to eye 3 (three) times daily.  [provider]  Active Self  cetirizine (ZYRTEC) 10 MG tablet [id number]  Take 10 mg by mouth daily as needed for allergies. [provider]  Active Self  clopidogrel (PLAVIX) 75 MG tablet [id number]  Take 1 tablet by mouth once daily [name], MD  Active   Coenzyme Q10 (COQ10) 200 MG CAPS [id number]  Take 200 mg by mouth daily. [provider]  Active Self  ezetimibe (ZETIA) 10 MG tablet [id number]  Take 1 tablet by mouth once daily Troy Sine, MD  Active   famotidine (PEPCID) 40 MG tablet [id number]  Take 1 tablet (40 mg total) by mouth 2 (two) times daily. Milus Banister, MD  Active Self  fluticasone [facility]) 50 MCG/ACT nasal spray [id number]  Place 2 sprays into both nostrils daily. [name], MD  Active Self  glimepiride (AMARYL) 4 MG tablet [id number] Yes Take 1 tablet (4 mg total) by mouth daily with breakfast. [name], MD Taking Active   latanoprost (XALATAN) 0.005 % ophthalmic solution [id number]  Place 1 drop into both eyes at bedtime. [provider]  Active Self  lisinopril (ZESTRIL) 5 MG tablet [id number]  Take 1 tablet by mouth once daily [name], MD  Active   metFORMIN (GLUCOPHAGE) 1000 MG tablet [id number] Yes Take 1 tablet (1,000 mg total) by mouth 2 (two) times daily with a meal. [name], MD Taking Active    nitroGLYCERIN (NITROSTAT) 0.4 MG SL tablet [id number]  Place 1 tablet (0.4 mg total) under the tongue every 5 (five) minutes as needed for chest pain. Troy Sine, MD  Active Self           Med Note Rosana Hoes, SOPHIA A   Fri Jul 16, 2020 11:24 AM) On hand  omega-3 acid ethyl esters (LOVAZA) 1 g capsule [id number]  Take 2 capsules (2 g total) by mouth 2 (two) times daily. [name], MD  Active   pioglitazone (ACTOS) 30 MG tablet [id number] Yes Take 1 tablet (30 mg total) by mouth daily. [name], MD Taking Active   senna-docusate (SENOKOT-S) 8.6-50 MG tablet [id number]  Take 1 tablet by mouth at bedtime as needed for mild constipation. Louellen Molder, MD  Active Self            Patient Active Problem List   Diagnosis Date Noted   Bradycardia 04/10/2020   Type 2 diabetes mellitus with diabetic nephropathy, without long-term current use of insulin ([place]) 04/10/2020   Headache 04/09/2020   Stenosis of right subclavian artery ([place]) 02/08/2020   Atypical nevus 01/11/2020   GERD (gastroesophageal reflux disease) 09/03/2019   Adenoma of left adrenal gland 08/25/2019   Thoracic aorta atherosclerosis ([place]) 05/18/2019   COPD (chronic obstructive pulmonary disease) ([place]) 05/18/2019   Fatty liver 03/30/2018   Gallstone 03/30/2018   Acute bacterial sinusitis 12/18/2017   Osteoarthritis 03/08/2017   Epidermal cyst of neck 03/06/2017   Glaucoma    Health maintenance examination 10/07/2014   Medicare annual wellness visit, subsequent 10/07/2014   Advanced care planning/counseling discussion 10/07/2014   Smoker 10/07/2014   Bilateral foot pain 09/07/2014   CAD in native artery 04/17/2014   Hyperlipidemia with target LDL less than 70 10/08/2013   Essential hypertension 10/08/2013   Obesity, Class I, BMI 30.0-34.9 (see actual BMI) 10/08/2013   Complex tear of medial meniscus of left knee as current injury  07/26/2012   Thrombocytopenia ([place]) 10/12/2011   OTHER ATOPIC DERMATITIS  AND RELATED CONDITIONS 06/24/2010   LUMBAR RADICULOPATHY 07/18/2007   Diabetes mellitus type 2, uncontrolled, with complications 76/19/5093   HIP PAIN, BILATERAL 07/05/2007   OSA on CPAP 05/07/2007   Agent orange exposure 10/23/1966    Immunization History  Administered Date(s) Administered   Fluad Quad(high Dose 65+) 07/29/2019, 08/26/2021   Influenza Split 08/02/2012   Influenza, High Dose Seasonal PF 07/07/2020   Influenza,inj,Quad PF,6+ Mos 09/17/2013, 09/07/2014, 09/06/2016, 11/07/2017   Influenza-Unspecified 09/28/2015, 07/23/2018   PFIZER(Purple Top)SARS-COV-2 Vaccination 12/15/2019, 01/05/2020   Pneumococcal Conjugate-13 10/23/2013   Pneumococcal-Unspecified 10/23/2012   Td 10/23/2000   Tdap 10/23/2012    Conditions to be addressed/monitored:  Hypertension, Hyperlipidemia, and Diabetes  Care Plan : [place]  Updates made by [name], [place] since 09/05/2021 12:00 AM     Problem: CHL AMB "PATIENT-SPECIFIC PROBLEM"      Long-Range Goal: Disease Management   Start Date: 09/05/2021  Priority: High  Note:   Current Barriers:  Home blood pressure elevated Last A1c above goal, home blood glucose readings elevated  Pharmacist Clinical Goal(s):  Patient will achieve improvement in diet as evidenced by A1c through collaboration with PharmD and provider.   Interventions: 1:1 collaboration with [name], MD regarding development and update of comprehensive plan of care as evidenced by provider attestation and co-signature Inter-disciplinary care team collaboration (see longitudinal plan of care) Comprehensive medication review performed; medication list updated in electronic medical record  Hypertension (BP goal <130/80) -Not ideally controlled - clinic and home readings elevated -Current treatment: Atenolol 25 mg - 1 tablet twice daily Lisinopril 5 mg - 1 tablet daily  -Sleep apnea complicates, wears CPAP -Medications previously tried:  History of bradycardia on atenolol, dose reduced and split to BID to reduce bradycardia; -Current home readings: this morning - 164/79, 45  weight 231 lbs; 183/77, 45 on recheck (pt has Omron BP machine, with arm cuff); will recheck this afternoon and call if still 180s. Not checking routinely. -Current dietary habits: not following any particular diet, wife tries to cook healthy meals  -Caffeine: He is currently drinking coffee during BP check, drinks coffee throughout the day (caffeinated) -Tobacco use: 1 PPD throughout the day (He is not currently interested in quitting, He does have nicotine patches on hand) -Current exercise habits: none  -Denies hypotensive/hypertensive symptoms -Educated on BP goals and benefits of medications for prevention of heart attack, stroke and kidney damage; -Counseled to monitor BP at home daily, document, and provide log at future appointments -Recommended to continue current medication; Recheck home BP today, call this afternoon if still elevated. Follow up 1 week for BP log.  Hyperlipidemia: (LDL goal < 70) -Controlled - stable, not discussed 09/05/21 -Current treatment: Zetia 10 mg - 1 tablet daily Atorvastatin 40 mg - 1 tablet daily Lovaza 1 g - 2 capsules BID   -Medications previously tried: Lovaza   -Educated on Cholesterol goals;  -Recommended to continue current medication  Diabetes (A1c goal <7%) -Not ideally controlled, pt is due for A1c. He cancelled appt due to office relocation. Rescheduled for 10/2020. Lats visit with CCM recommended discussing Jardiance through [place] since pt not eligible for PAP. [facility]. His Actos was increased to 30 mg 12/2020 per PCP. I think we need to reconsider Jardiance through [place].  -Current medications: Metformin 1000 mg - 1 tablet BID Glimepiride 4 mg - 1 tablet daily Pioglitazone 30 mg - 1  tablet daily -Medications previously tried: Jardiance, Ozempic, Januvia - unaffordable -Current home glucose readings  - checked this morning, had peach cobbler and ice cream last night  fasting glucose: 208 (09/05/21),180s usually  post prandial glucose: none, when he checks it in evening 4-5 PM, its lower  -Denies hypoglycemic/hyperglycemic symptoms -Current meal patterns: does not watch diet  breakfast: skips  lunch: first meal may be 12-3 PM, PB banana sandwich   dinner: high carb, lots of potatoes/bread snacks: more sweets than "should be", tries to cook healthy (1/4 of plate) drinks: rare sugary drinks, drinks water mostly  -Current exercise: not much, very seldom, 2 bad knees, needs them replaced (joint pain 24/7, foot pain/plantar fascitis, see [facility] - appt 09/21/21), its about a year since he tried injections  -Educated on A1c and blood sugar goals; Carbohydrate counting and/or plate method; Discussed limiting portions and sweets. -Counseled to check feet daily and get yearly eye exams - pt is up to date through [place] -Recommended to continue current medication; A1c check January 2022. He will try to cut back sweets. At that time, we can try Jardiance PAP if needed.  Patient Goals/Self-Care Activities Patient will:  - take medications as prescribed as evidenced by patient report and record review check glucose daily, document, and provide at future appointments check blood pressure daily for 7 days, document, and provide at future appointments  Follow Up Plan: Telephone follow up appointment with care management team member scheduled for:  CCM team will contact patient in 7 days for BP log. CCM follow up 1 month.    Medication Assistance: None required.  Patient affirms current coverage meets needs. We did not apply for was assistance programs in the past due to [place] benefits/income.   Compliance/Adherence/Medication fill history: Care Gaps: Eye exam - up to date Foot Exam - up to date A1c due every 6 months - scheduled 10/2021 (pt delayed due to office relocation)  Star-Rating  Drugs: Medication:                Last Fill:         Day Supply Atorvastatin 55m         06/12/2021       90 Lisinopril 560m            06/28/2021        90 Metformin 100023m   06/28/2021        90 Pioglitazone 46m58m   07/11/2021         90 Glimepiride 4mg 40m      06/28/2021           90   No gaps in adherence  [facility]  [facility] [address] [place] [place]: [phone] (MAIL [place]e: [phone] pill box? Unknown  Pt endorses 100% compliance He uses [facility] for medications.  Care Plan and Follow Up Patient Decision:  Patient agrees to Care Plan and Follow-up.  MicheDebbora DusrmD Clinical Pharmacist [phone]

## 2021-09-20 ENCOUNTER — Other Ambulatory Visit: Payer: Self-pay | Admitting: Family Medicine

## 2021-09-21 DIAGNOSIS — E785 Hyperlipidemia, unspecified: Secondary | ICD-10-CM | POA: Diagnosis not present

## 2021-09-21 DIAGNOSIS — F17210 Nicotine dependence, cigarettes, uncomplicated: Secondary | ICD-10-CM

## 2021-09-21 DIAGNOSIS — Z7984 Long term (current) use of oral hypoglycemic drugs: Secondary | ICD-10-CM | POA: Diagnosis not present

## 2021-09-21 DIAGNOSIS — E1169 Type 2 diabetes mellitus with other specified complication: Secondary | ICD-10-CM | POA: Diagnosis not present

## 2021-09-21 DIAGNOSIS — I10 Essential (primary) hypertension: Secondary | ICD-10-CM

## 2021-09-21 NOTE — Telephone Encounter (Signed)
Plavix Last filled:  06/28/21, #90 Last OV:  01/10/21, CPE Next OV:  10/26/21, 6 mo DM f/u

## 2021-10-25 ENCOUNTER — Ambulatory Visit (INDEPENDENT_AMBULATORY_CARE_PROVIDER_SITE_OTHER): Payer: HMO | Admitting: Cardiovascular Disease

## 2021-10-25 ENCOUNTER — Encounter: Payer: Self-pay | Admitting: Cardiovascular Disease

## 2021-10-25 ENCOUNTER — Other Ambulatory Visit: Payer: Self-pay

## 2021-10-25 VITALS — BP 154/65 | HR 43 | Ht 69.5 in | Wt 244.0 lb

## 2021-10-25 DIAGNOSIS — E785 Hyperlipidemia, unspecified: Secondary | ICD-10-CM

## 2021-10-25 DIAGNOSIS — E66812 Obesity, class 2: Secondary | ICD-10-CM

## 2021-10-25 DIAGNOSIS — I2510 Atherosclerotic heart disease of native coronary artery without angina pectoris: Secondary | ICD-10-CM

## 2021-10-25 DIAGNOSIS — I10 Essential (primary) hypertension: Secondary | ICD-10-CM

## 2021-10-25 DIAGNOSIS — R001 Bradycardia, unspecified: Secondary | ICD-10-CM

## 2021-10-25 DIAGNOSIS — G4733 Obstructive sleep apnea (adult) (pediatric): Secondary | ICD-10-CM

## 2021-10-25 DIAGNOSIS — E118 Type 2 diabetes mellitus with unspecified complications: Secondary | ICD-10-CM | POA: Diagnosis not present

## 2021-10-25 DIAGNOSIS — K219 Gastro-esophageal reflux disease without esophagitis: Secondary | ICD-10-CM

## 2021-10-25 DIAGNOSIS — Z720 Tobacco use: Secondary | ICD-10-CM | POA: Diagnosis not present

## 2021-10-25 DIAGNOSIS — Z9989 Dependence on other enabling machines and devices: Secondary | ICD-10-CM | POA: Diagnosis not present

## 2021-10-25 DIAGNOSIS — E669 Obesity, unspecified: Secondary | ICD-10-CM | POA: Diagnosis not present

## 2021-10-25 MED ORDER — ATENOLOL 25 MG PO TABS: ORAL_TABLET | ORAL | 3 refills | Status: DC

## 2021-10-25 MED ORDER — ISOSORBIDE MONONITRATE ER 30 MG PO TB24: 30.0000 mg | ORAL_TABLET | Freq: Every day | ORAL | 11 refills | Status: DC

## 2021-10-25 NOTE — Patient Instructions (Signed)
Medication Instructions:  DECREASE ATENOLOL 25MG  IN THE AM AND 12.5MG  IN THE PM  *If you need a refill on your cardiac medications before your next appointment, please call your pharmacy*  Lab Work:   Testing/Procedures:  WITH [facility]   NONE  Follow-Up: Your next appointment:  4-6 month(s) In Person with Shelva Majestic, MD   At [facility], you and your health needs are our priority.  As part of our continuing mission to provide you with exceptional heart care, we have created designated Provider Care Teams.  These Care Teams include your primary Cardiologist (physician) and Advanced Practice Providers (APPs -  Physician Assistants and Nurse Practitioners) who all work together to provide you with the care you need, when you need it.  We recommend signing up for the patient portal called "MyChart".  Sign up information is provided on this After Visit Summary.  MyChart is used to connect with patients for Virtual Visits (Telemedicine).  Patients are able to view lab/test results, encounter notes, upcoming appointments, etc.  Non-urgent messages can be sent to your provider as well.   To learn more about what you can do with MyChart, go to [email or web address].

## 2021-10-25 NOTE — Progress Notes (Signed)
Patient ID: Evan Avery, male   DOB: May 22, 1946, 76 y.o.   MRN: [id number]   Primary M.D.: Dr. Danise Mina  HPI: Evan Avery is a 76 y.o. male who presents today for 10 month cardiology evaluation.  Evan Avery has known CAD and has a total of 4 stents in his RCA. In 1992, he underwent initial PTCA of his RCA. Subsequently, he underwent stenting of the proximal and mid RCA.  In 1999 his fourth stent was placed in the mid RCA between the 2 previously placed stents and was a 3.5x28 mm non-DES [place] stent. His last nuclear perfusion study in August 2012 showed inferior thinning without significant ischemia.  EF was not calculated secondary to  ectopy but previously had been 52%.  Additional problems include hypertension, hyperlipidemia, obesity, and obstructive sleep apnea for which he is he uses CPAP 100% of the time and uses  [employer] his [employer].  Since I last saw him in May 2017.  He denied any recurrent episodes of chest pain or significant dyspnea.  He had a new CPAP machine and continue to be compliant.  He established primary care with Dr. Danise Mina.  Review of laboratory that he had with him in December 2016 has shown a cholesterol of 149, HDL 27, but his triglycerides were 413.  Remotely he had triglyceride elevations as high as 584.  In December, his hemoglobin A1c was 7.7.  He underwent surgery in July 2017 due to an abscess in his colon.  He required a colostomy for 6 months and in January 2018 underwent colostomy closure.   I saw him in October 2018 after not having seen him in over 17 months.  Since that evaluation, he has undergone 2 resections for cyst on the back of his neck with recurrent infections.  He recently saw Dr. Brantley Stage consideration of surgical excision.  He has felt well with reference to chest pain or shortness of breath.  He had smoked for many years but quit smoking for 10 years but unfortunately resumed smoking in June 2018 and currently smoking  1 pack/day.  He has not had a nuclear perfusion study since 2012.  He continues to use CPAP with 100% compliance.  He was  started on Januvia for his diabetes mellitus by his primary physician.    When I saw hime in June 2019  he was in need of back and neck surgery.  I recommended he undergo a [place] study for preoperative assessment.  This was done on April 23, 2018 which showed a small inferior defect, low risk.  EF was 50% with basal inferior hypokinesis.  He tolerated his neck and back surgery well.  Is only he is without anginal symptoms.  He has a documented umbilical/ventral  hernia and has been without abdominal discomfort.  He continues to use CPAP with 100% compliance.  He received a new CPAP machine at the [employer] [employer] is a [employer] AirSense 10.  He gets his supplies from the [place].    I saw him in February 2020 and  saw him in January 2021.  He denied any chest pain  with walking but does admit to some mild shortness of breath.  Unfortunately he still smokes 1 pack of cigarettes per day.  He had experienced some throat burning which had improved with pantoprazole.  He has an umbilical hernia and ventral hernia but denies any pain.  He continues to be on aspirin and Plavix for DAPT.  He is  on atorvastatin 34m,  Zetia 10 mg and Lovaza 2 capsules twice daily for mixed hyperlipidemia.  He continues to be on lisinopril 5 mg and atenolol 50 mg in the morning and 25 mg in the evening for hypertension.  There are no palpitations.  He is diabetic on glimepiride and Metformin.  He was bradycardic but was entirely asymptomatic.  I last saw him in July 2021 at that time he stated that he had experienced  several episodes of chest heaviness.  His episodes were short-lived times he would take Tums with benefit.  Unfortunately he still smokes cigarettes.  He has been drinking an excessive amount of coffee typically at least 6 cups/day.  There was some dyspepsia and he recently started taking Pepcid with some  improvement.  He apparently is need [place] [facility] during [place] evaluation, I again discussed the importance of smoking cessation.  With his longstanding history of CAD, and recent chest discomfort I recommended he undergo 2-year follow-up Lexiscan Myoview for further evaluation.    He underwent a LUgandaMyoview study on May 11, 2020.  There was evidence for small size mild fixed mid inferior perfusion defect most likely scar.  There was no associated ischemia.  EF was 43% with basal to mid inferior hypokinesis.  There was no change from the prior study in 2019.  I saw him on June 21, 2020 at which time he felt improved.  He denied any recurrent chest pain or exertional symptoms.  He was trying to significantly reduce his tobacco which previously had been 1 to 2 packs/day and most recently was 1 pack/day.  During that evaluation, his ECG showed marked sinus bradycardia at 43 bpm.  I recommended he reduce his a atenolol to 25 mg twice a day.  I last saw him on December 20, 2020 and since his prior evaluation he had remained cardiovascularly stable.    He has issues with GERD and is now on Pepcid 40 mg twice daily followed by Dr. JArdis Hughsof [facility].  He continues to use [place] [facility]  Unfortunately he is still smoking 1 pack/day.  He will be undergoing repeat laboratory in 2 weeks with Dr. GDanise Mina  He denied any recurrent chest pain PND orthopnea, palpitations, presyncope or syncope.  Since I last saw him, unfortunately his son died at 436in A2022-05-03  He had significant health related issues prior to his death.  Evan Avery any chest pain.  He is still smoking approximately 1 pack/day.  He continues to use CPAP 100% compliance.  He continues to have issues with GERD and is on therapy.  His last stress test was done in July 2021 which showed a nuclear EF at 43%.  There was evidence for fixed mid inferior perfusion defect most likely scar.  At times  he does note a vague chest sensation which he feels may be his GERD, but at times he did experience some left arm radiation.  He denies presyncope or syncope.  He is unaware of palpitations.  At times he has experienced some mild ankle.  He presents for evaluation.    Past Medical History:  Diagnosis Date   Agent orange exposure 1968   Arthritis    CAD (coronary artery disease)    4 stents in right artery   Complex tear of medial meniscus of left knee as current injury 07/26/2012   [place]   Diabetes mellitus  type 2   Diverticulitis of colon with perforation 05/23/2016   DVT (deep venous thrombosis) (HCC)    GERD (gastroesophageal reflux disease)    Glaucoma 2006   [facility]   Hx of colonic polyps    Hyperlipidemia    Hypertension    Intra-abdominal abscess ([place]) 05/16/2016   LUMBAR RADICULOPATHY 07/18/2007   Qualifier: Diagnosis of  By: Arnoldo Morale MD, John E    Myocardial infarction [facility])    1992   Sleep apnea    uses CPAP regularly    Past Surgical History:  Procedure Laterality Date   [place] N/A 05/23/2016   Procedure: SMALL BOWEL RESECTION;  Surgeon: Jackolyn Confer, MD;  Location: [facility];  Service: General;  Laterality: N/A;   CARDIAC CATHETERIZATION  08/11/2008   angiographically patent LAD diagonal, circumflex and multiple circumflex obtuse marginal branches from the L coronary artery; stenting of mid R coronary artery with 3.5 x 64m Liberte non DES postdilated w/ 4.0 DuraStent ([name]   CARDIOVASCULAR STRESS TEST  06/16/2011   R/P MV - moderate perfusion defect in basal inferoseptal, basal inferior, mid inferseptal and mid inferior regions, consistent with infarct/scar and/or diaphragmatic attenuation; non-gated study secondary to ectopy; no CP or EKG changes for ischemia; abnormal study although no significant changes from previous study; in the absense of gated images cannot calculate EF or distinguish scar/artifact   COLONOSCOPY   08/2016   TAx5, sessile serrated polyp, diverticulosis, rpt 2 yrs ([place])   COLONOSCOPY  10/2020   1.5cm SSP, HP, rpt (Ardis Hughs   COLONOSCOPY  08/2020   multiple polpys removed (TA, HP), 1.5cm polyp biopsied (SSP), colon lipoma (Ardis Hughs   COLONOSCOPY WITH PROPOFOL N/A 11/11/2020   Procedure: COLONOSCOPY WITH PROPOFOL;  Surgeon: JMilus Banister MD;  [facility];  Service: Endoscopy;  Laterality: N/A;   COLOSTOMY Left 05/23/2016   Procedure: COLOSTOMY;  Surgeon: TJackolyn Confer MD;  [facility]: [facility];  Service: General;  Laterality: Left;   COLOSTOMY TAKEDOWN N/A 11/10/2016   Procedure: LAPAROSCOPIC LYSIS OF ADHESIONS AND  COLOSTOMY CLOSURE;  Surgeon: TJackolyn Confer MD;  [facility];  Service: General;  Laterality: N/A;   CYST EXCISION  04/2018   posterior neck ([place])   DOPPLER ECHOCARDIOGRAPHY  08/20/2008   EF >>53% LV systolic function normal; LV mildly dilated; doppler flow suggestive of impaired LV relaxation; RV mildly dilated, RV systolic function normal, RV systolic pressure normal   ESOPHAGOGASTRODUODENOSCOPY  08/2020   gastritis, duodenitis likely NSAID/ASA related, H pylori neg - rec stop aspirin (Ardis Hughs   INCISION AND DRAINAGE ABSCESS N/A 05/23/2016   Procedure: DRAINAGE OF MULTIPLE INTRA  ABDOMINAL ABSCESS;  Surgeon: TJackolyn Confer MD;  [facility];  Service: General;  Laterality: N/A;   KNEE SURGERY Left 2015   torn meniscus ([place]   LAPAROSCOPIC LYSIS OF ADHESIONS  11/10/2016   Procedure: LAPAROSCOPIC LYSIS OF ADHESIONS;  Surgeon: TJackolyn Confer MD;  [facility];  Service: General;;   LAPAROTOMY N/A 05/23/2016   Procedure: EXPLORATORY LAPAROTOMY;  Surgeon: TJackolyn Confer MD;  [facility];  Service: General;  Laterality: N/A;   MASS EXCISION N/A 05/22/2018   Procedure: EXCISION OF CYST ON POSTERIOR NECK ERAS PATHWAY;  Surgeon: CErroll Luna MD;  [place]  Service: General;  Laterality: N/A;    PERCUTANEOUS CORONARY STENT INTERVENTION (PCI-S)  1992, 1999, 2004   4 vessels ([name]   POLYPECTOMY  11/11/2020   Procedure: POLYPECTOMY;  Surgeon:  Milus Banister, MD;  Location: [facility];  Service: Endoscopy;;   PROCTOSCOPY  11/10/2016   Procedure: PROCTOSCOPY;  Surgeon: Jackolyn Confer, MD;  [facility];  Service: General;;   SUBMUCOSAL LIFTING INJECTION  11/11/2020   Procedure: SUBMUCOSAL LIFTING INJECTION;  Surgeon: Milus Banister, MD;  Location: [facility];  Service: Endoscopy;;    Allergies  Allergen Reactions   Dilaudid [Hydromorphone Hcl] Itching   Ambien [Zolpidem] Other (See Comments)    Pt states "makes me wild"   Niacin And Related Other (See Comments)    Extreme fatigue    Current Outpatient Medications  Medication Sig Dispense Refill   isosorbide mononitrate (IMDUR) 30 MG 24 hr tablet Take 1 tablet (30 mg total) by mouth daily. 30 tablet 11   atenolol (TENORMIN) 25 MG tablet Take 1 tablet (25 mg total) by mouth every morning AND 0.5 tablets (12.5 mg total) every evening. 180 tablet 3   atorvastatin (LIPITOR) 40 MG tablet TAKE 1 TABLET BY MOUTH ONCE DAILY AT  6PM 90 tablet 3   Brinzolamide-Brimonidine 1-0.2 % SUSP Apply 1 drop to eye 3 (three) times daily.      cetirizine (ZYRTEC) 10 MG tablet Take 10 mg by mouth daily as needed for allergies.     clopidogrel (PLAVIX) 75 MG tablet Take 1 tablet by mouth once daily 90 tablet 0   Coenzyme Q10 (COQ10) 200 MG CAPS Take 200 mg by mouth daily.     ezetimibe (ZETIA) 10 MG tablet Take 1 tablet by mouth once daily 90 tablet 2   famotidine (PEPCID) 40 MG tablet Take 1 tablet (40 mg total) by mouth 2 (two) times daily. 60 tablet 11   fluticasone (FLONASE) 50 MCG/ACT nasal spray Place 2 sprays into both nostrils daily. 16 g 1   glimepiride (AMARYL) 4 MG tablet Take 1 tablet (4 mg total) by mouth daily with breakfast. 90 tablet 3   latanoprost (XALATAN) 0.005 % ophthalmic solution Place 1 drop into both eyes at bedtime.      lisinopril (ZESTRIL) 5 MG tablet Take 1 tablet by mouth once daily 90 tablet 3   metFORMIN (GLUCOPHAGE) 1000 MG tablet Take 1 tablet (1,000 mg total) by mouth 2 (two) times daily with a meal. 180 tablet 3   nitroGLYCERIN (NITROSTAT) 0.4 MG SL tablet Place 1 tablet (0.4 mg total) under the tongue every 5 (five) minutes as needed for chest pain. 30 tablet 3   omega-3 acid ethyl esters (LOVAZA) 1 g capsule Take 2 capsules (2 g total) by mouth 2 (two) times daily. 360 capsule 3   pioglitazone (ACTOS) 30 MG tablet Take 1 tablet (30 mg total) by mouth daily. 90 tablet 3   senna-docusate (SENOKOT-S) 8.6-50 MG tablet Take 1 tablet by mouth at bedtime as needed for mild constipation. 15 tablet 0   No current facility-administered medications for this visit.    Socially he is married has 2 children 2 grandchildren. He does walk several times a week. He does have a tobacco history. There is no alcohol use.   ROS General: Negative; No fevers, chills, or night sweats; positive for obesity. HEENT: Negative; No changes in vision or hearing, sinus congestion, difficulty swallowing Pulmonary: Negative; No cough, wheezing, shortness of breath, hemoptysis Cardiovascular: See HPI GI: Colonic abscess requiring colostomy, and ultimate closure; positive for dyspepsia GU: Negative; No dysuria, hematuria, or difficulty voiding Musculoskeletal: He is status post left knee surgery; no myalgias, joint pain, or weakness Hematologic/Oncology: Negative; no easy bruising, bleeding  Endocrine: Negative; no heat/cold intolerance; no diabetes Neuro: Negative; no changes in balance, headaches Skin: Recurrent cysts in the back of his neck Psychiatric: Negative; No behavioral problems, depression Sleep: Positive for sleep apnea, on CPAP with 100% compliance. No snoring, daytime sleepiness, hypersomnolence, bruxism, restless legs, hypnogognic hallucinations, no cataplexy Other comprehensive 14 point system review is  negative.   PE BP (!) 154/65    Pulse (!) 43    Ht 5' 9.5" (1.765 m)    Wt 244 lb (110.7 kg)    SpO2 96%    BMI 35.52 kg/m    Repeat blood pressure 132/66  Wt Readings from Last 3 Encounters:  10/25/21 244 lb (110.7 kg)  01/10/21 236 lb 7 oz (107.2 kg)  12/20/20 235 lb (106.6 kg)   General: Alert, oriented, no distress.  Skin: normal turgor, no rashes, warm and dry HEENT: Normocephalic, atraumatic. Pupils equal round and reactive to light; sclera anicteric; extraocular muscles intact;  Nose without nasal septal hypertrophy Mouth/Parynx benign; Mallinpatti scale 3 Neck: Thick neck; no JVD, no carotid bruits; normal carotid upstroke Lungs: clear to ausculatation and percussion; no wheezing or rales Chest wall: without tenderness to palpitation Heart: PMI not displaced, RRR, s1 s2 normal, 1/6 systolic murmur, no diastolic murmur, no rubs, gallops, thrills, or heaves Abdomen: Previously noted abdominal hernia;soft, nontender; no hepatosplenomehaly, BS+; abdominal aorta nontender and not dilated by palpation. Back: no CVA tenderness Pulses 2+ Musculoskeletal: full range of motion, normal strength, no joint deformities Extremities: no clubbing cyanosis or edema, Homan's sign negative  Neurologic: grossly nonfocal; Cranial nerves grossly wnl Psychologic: Normal mood and affect    October 25, 2021 ECG (independently read by me):  Sinus bradycardia at 43; mild T wave abnormality  December 20, 2020 ECG (independently read by me): Sinus bradycardia at 49  August 2021 ECG (independently read by me): Sinus Bradycardia at 43; no ectopy; normal intervals  July 2021ECG (independently read by me): Sinus bradycardia at 43 bpm.  No significant ST-T changes.  Normal intervals.  No ectopy  November 10, 2019 ECG (independently read by me):Sinus bradycardia at 46 bpm.  Nonspecific T changes.  Normal intervals.  No ectopy  February 2020 ECG (independently read by me): Sinus Bradycardioa 51; Norml  intervals  June 2018 ECG (independently read by me): Sinus bradycardia 57 bpm.  No ectopy.  Normal intervals.  October 2018 ECG (independently read by me): Normal sinus rhythm with PAC.  PR interval 168 ms.  QTc interval 454 ms.  May 2017 ECG (independently read by me): Sinus bradycardia 52 bpm.  T-wave abnormality in leads 3 and aVF.  May 2016 ECG (independently read by me): Sinus bradycardia 53 bpm.  Nondiagnostic T changes with T-wave inversion in lead 3.  June 2015 ECG (independently read by me):  sinus bradycardia at 55 QTc interval 447 ms.  PR interval 174 ms.  Nondiagnostic T changes in lead 3.  ECG: Sinus rhythm at 56 beats per minute. No ectopy. Normal intervals; nonspecific T changes in lead 3.  LABS: BMP Latest Ref Rng & Units 01/04/2021 09/24/2020 03/12/2020  Glucose 70 - 99 mg/dL 217(H) 245(H) -  BUN 6 - 23 mg/dL 18 17 -  Creatinine 0.40 - 1.50 mg/dL 1.13 1.09 1.2  Sodium 135 - 145 mEq/L 141 139 139  Potassium 3.5 - 5.1 mEq/L 4.5 4.3 4.9  Chloride 96 - 112 mEq/L 105 104 -  CO2 19 - 32 mEq/L 28 26 -  Calcium 8.4 - 10.5 mg/dL 9.7 9.6 -  Hepatic Function Latest Ref Rng & Units 01/04/2021 03/12/2020 01/02/2020  Total Protein 6.0 - 8.3 g/dL 6.3 - 6.3  Albumin 3.5 - 5.2 g/dL 4.0 - 4.0  AST 0 - 37 U/L _0 ALT 0 - 53 U/L 21 35 17  Alk Phosphatase 39 - 117 U/L 58 63 64  Total Bilirubin 0.2 - 1.2 mg/dL 0.6 - 0.7  Bilirubin, Direct 0.01 - 0.4 - 0.1 -    CBC Latest Ref Rng & Units 01/04/2021 03/12/2020 01/02/2020  WBC 4.0 - 10.5 K/uL 6.8 8.2 7.7  Hemoglobin 13.0 - 17.0 g/dL 12.9(L) 14.0 13.6  Hematocrit 39.0 - 52.0 % 37.3(L) - 39.7  Platelets 150.0 - 400.0 K/uL 126.0(L) 133(A) 144.0(L)   Lab Results  Component Value Date   TSH 0.90 03/12/2020   Lab Results  Component Value Date   HGBA1C 8.1 (H) 01/04/2021   Lipid Panel     Component Value Date/Time   CHOL 72 01/04/2021 0757   CHOL 149 03/13/2016 0803   TRIG 125.0 01/04/2021 0757   TRIG 387 (H) 03/13/2016 0803    HDL 28.40 (L) 01/04/2021 0757   HDL 26 (L) 03/13/2016 0803   CHOLHDL 3 01/04/2021 0757   VLDL 25.0 01/04/2021 0757   LDLCALC 19 01/04/2021 0757   LDLCALC 46 03/13/2016 0803   LDLDIRECT 61.0 12/27/2018 1245    RADIOLOGY: No results found.  [place] Myoview 05/11/2020 Study Highlights    The left ventricular ejection fraction is moderately decreased (30-44%). Nuclear stress EF: 43%. No T wave inversion was noted during stress. There was no ST segment deviation noted during stress. Defect 1: There is a small defect of mild severity present in the mid inferior location. Findings consistent with prior myocardial infarction. This is an intermediate risk study.   Small size, mild severity fixed mid inferior perfusion defect, likely scar. LVEF 43% with basal to mid inferior hypokinesis. This is an intermediate risk study. Compared to a prior study in 2019, the LVEF is calculated lower, but appears visually higher - the perfusion defect appears unchanged.     IMPRESSION:  1. Essential hypertension   2. CAD in native artery   3. OSA on CPAP   4. Bradycardia   5. Hyperlipidemia with target LDL less than 70   6. Type 2 diabetes mellitus with complication, without long-term current use of insulin (HCC)   7. Tobacco abuse   8. Gastroesophageal reflux disease without esophagitis   9. Obesity, Class II, BMI 35-39.9     ASSESSMENT AND PLAN: Evan Avery  is a 76 year old Caucasian male who has CAD and underwent initial PTCA to his RCA in 1992.  His last catheterization was in October 2009 when his fourth stent was placed in his RCA.   He continues to be on dual antiplatelet therapy with aspirin and Plavix with his multiple stents.  A preoperative Lexiscan Myoview study in July 2019 prior to his back surgery was low risk and showed a small inferior defect with EF 50%.  When I saw him in July 2021 he had developed nonexertional episodes of chest pressure which were short-lived and often improved  with antacid therapy. Symptoms would typically last a minute or 2.  He was started on Pepcid with improvement.  At the time he was drinking over 6 cups of coffee per day which undoubtedly contributed to his dyspeptic symptomatology.  His [place] study was unchanged from 2019 and showed mild severity fixed mid inferior perfusion defect most likely scar.  Nuclear EF was 43% but visually it appeared higher.  He has been experiencing some reflux symptomatology but at times has also experienced some vague left arm sensation.  His ECG today shows sinus bradycardia at 43 bpm.  I recommended he reduce his atenolol from 25 mg twice a day to 25 mg in the morning and 12.5 mg at night.  I am also empirically adding isosorbide mononitrate 30 mg to his medical regimen which would provide potential anti-ischemic benefit.  He continues to be on DAPT with aspirin/Plavix.  He is on atorvastatin 40 mg , Lovaza 2 capsules twice a day and Zetia.  He is diabetic on glimepiride, metformin, in addition to pioglitazone.  He continues to use CPAP with 100% compliance.  BMI is increased at 35.5 consistent with his previous moderate obesity.  We discussed the importance of complete smoking cessation.  I will see him in 6 months for follow-up evaluation or sooner as needed.      Evan Sine, MD, [facility]  10/26/2021 8:07 AM

## 2021-10-26 ENCOUNTER — Encounter: Payer: Self-pay | Admitting: Cardiovascular Disease

## 2021-10-26 ENCOUNTER — Ambulatory Visit: Payer: No Typology Code available for payment source | Admitting: Family Medicine

## 2021-11-12 ENCOUNTER — Other Ambulatory Visit: Payer: Self-pay | Admitting: Family Medicine

## 2021-12-15 ENCOUNTER — Telehealth: Payer: Self-pay

## 2021-12-15 NOTE — Chronic Care Management (AMB) (Signed)
Chronic Care Management Pharmacy Assistant   Name: Evan Avery  MRN: [id number] DOB: 03/21/1946   Reason for Encounter:Diabetes  Disease State    Recent office visits:  None since last CCM ocntact  Recent consult visits:  10/25/21- [place] and Dept of [facility]- Patient presented for eye check Discontinue latanoprost and enter nonformulary request for rocklatan qhs OU 10/25/21-Cardiology-Thomas Kelly,MD-Patient presented for follow up heart disease.ECG,Recommend Decrease Atenolol  to 25mg  am and 12.5mg  ( 1/2 tablet pm) Add isosorbide mononitrate 30 mg to his medical regimen which would provide potential anti-ischemic benefit 09/21/21-[facility]-Patient presented for toe nail trimming  Hospital visits:  None in previous 6 months  Medications: Outpatient Encounter Medications as of 12/15/2021  Medication Sig Note   atenolol (TENORMIN) 25 MG tablet Take 1 tablet (25 mg total) by mouth every morning AND 0.5 tablets (12.5 mg total) every evening.    atorvastatin (LIPITOR) 40 MG tablet TAKE 1 TABLET BY MOUTH ONCE DAILY AT  6PM    Brinzolamide-Brimonidine 1-0.2 % SUSP Apply 1 drop to eye 3 (three) times daily.     cetirizine (ZYRTEC) 10 MG tablet Take 10 mg by mouth daily as needed for allergies.    clopidogrel (PLAVIX) 75 MG tablet Take 1 tablet by mouth once daily    Coenzyme Q10 (COQ10) 200 MG CAPS Take 200 mg by mouth daily.    ezetimibe (ZETIA) 10 MG tablet Take 1 tablet by mouth once daily    famotidine (PEPCID) 40 MG tablet Take 1 tablet (40 mg total) by mouth 2 (two) times daily.    fluticasone (FLONASE) 50 MCG/ACT nasal spray Place 2 sprays into both nostrils daily.    glimepiride (AMARYL) 4 MG tablet Take 1 tablet (4 mg total) by mouth daily with breakfast.    isosorbide mononitrate (IMDUR) 30 MG 24 hr tablet Take 1 tablet (30 mg total) by mouth daily.    latanoprost (XALATAN) 0.005 % ophthalmic solution Place 1 drop into both eyes  at bedtime.    lisinopril (ZESTRIL) 5 MG tablet Take 1 tablet by mouth once daily    metFORMIN (GLUCOPHAGE) 1000 MG tablet Take 1 tablet (1,000 mg total) by mouth 2 (two) times daily with a meal.    nitroGLYCERIN (NITROSTAT) 0.4 MG SL tablet Place 1 tablet (0.4 mg total) under the tongue every 5 (five) minutes as needed for chest pain. 07/16/2020: On hand   omega-3 acid ethyl esters (LOVAZA) 1 g capsule Take 2 capsules by mouth twice daily    pioglitazone (ACTOS) 30 MG tablet Take 1 tablet (30 mg total) by mouth daily.    senna-docusate (SENOKOT-S) 8.6-50 MG tablet Take 1 tablet by mouth at bedtime as needed for mild constipation.    No facility-administered encounter medications on file as of 12/15/2021.     Recent Relevant Labs: Lab Results  Component Value Date/Time   HGBA1C 8.1 (H) 01/04/2021 07:57 AM   HGBA1C 7.4 (A) 07/12/2020 08:02 AM   HGBA1C 8.2 03/12/2020 12:00 AM   HGBA1C 9.2 (H) 01/02/2020 08:35 AM   HGBA1C 8.4 02/12/2019 12:00 AM   MICROALBUR 0.2 12/27/2007 10:00 AM    Kidney Function Lab Results  Component Value Date/Time   CREATININE 1.13 01/04/2021 07:57 AM   CREATININE 1.09 09/24/2020 10:02 AM   CREATININE 0.96 03/13/2016 08:03 AM   GFR 63.62 01/04/2021 07:57 AM   GFRNONAA >60 05/15/2018 03:27 PM   GFRAA >60 05/15/2018 03:27 PM     Contacted patient on 12/16/21 to  discuss diabetes disease state.   Current antihyperglycemic regimen:   Glimepiride 4mg  -take 1 tablet with breakfast  Metformin 1000mg - take 1 tablet 2 times daily with meal   Pioglitazone 30mg - take 1 tablet daily       Patient verbally confirms he is taking the above medications as directed. Yes  What diet changes have been made to improve diabetes control?None identified, patient not very active due to bilateral OA with knees.  What recent interventions/DTPs have been made to improve glycemic control:   Cost concern of adding any more medications per the patient.Discussed outdated test strips  and suggested [facility].  Have there been any recent hospitalizations or ED visits since last visit with CPP? No  Patient denies hypoglycemic symptoms, including Pale, Sweaty, Shaky, Hungry, Nervous/irritable, and Vision changes  Patient denies hyperglycemic symptoms, including blurry vision, excessive thirst, fatigue, polyuria, and weakness  How often are you checking your blood sugar?   Infrequently   What are your blood sugars ranging?  Fasting:  Patient not checking regularly- tired of finger sticks- running  180's into 220.   During the week, how often does your blood glucose drop below 70? Never  Are you checking your feet daily/regularly? Yes  Adherence Review: Is the patient currently on a STATIN medication? Yes Is the patient currently on ACE/ARB medication? Yes Does the patient have >5 day gap between last estimated fill dates? No  Patient reports not missing any doses of medications   Care Gaps: Annual wellness visit in last year? Yes Most recent A1C reading:7.0 10/11/21 Most Recent BP reading:154/65 43-P  10/25/21  Last eye exam / retinopathy screening:10/25/21 Last diabetic foot exam:2021  Counseled patient on importance of annual eye and foot exam. Discussed and the patient states Dr.G checks out his feet and the [facility] checks [facility] his [facility]. The patient is able to check his feet for blisters and sores.  Star Rating Drugs:  Medication:  Last Fill: Day Supply Lisinopril 5mg   09/20/21 90 Atorvastatin 40mg  09/09/21 90 Pioglitazone 30mg  10/09/21 90 Glimepiride 4mg  09/09/21 90 Metformin 1000mg  09/24/21 90   PCP appointment on 01/03/22  Marjo Bicker CPP notified  Evan Avery, [place] Assistant [phone]  Total time spent for month CPA: 30 min.

## 2022-01-03 ENCOUNTER — Ambulatory Visit (INDEPENDENT_AMBULATORY_CARE_PROVIDER_SITE_OTHER): Payer: HMO | Admitting: Family Medicine

## 2022-01-03 ENCOUNTER — Encounter: Payer: Self-pay | Admitting: Family Medicine

## 2022-01-03 ENCOUNTER — Other Ambulatory Visit: Payer: Self-pay

## 2022-01-03 VITALS — BP 134/68 | HR 56 | Temp 97.7°F | Ht 69.5 in | Wt 243.1 lb

## 2022-01-03 DIAGNOSIS — E1169 Type 2 diabetes mellitus with other specified complication: Secondary | ICD-10-CM | POA: Diagnosis not present

## 2022-01-03 DIAGNOSIS — E669 Obesity, unspecified: Secondary | ICD-10-CM

## 2022-01-03 DIAGNOSIS — I10 Essential (primary) hypertension: Secondary | ICD-10-CM | POA: Diagnosis not present

## 2022-01-03 DIAGNOSIS — F17200 Nicotine dependence, unspecified, uncomplicated: Secondary | ICD-10-CM | POA: Diagnosis not present

## 2022-01-03 DIAGNOSIS — E785 Hyperlipidemia, unspecified: Secondary | ICD-10-CM

## 2022-01-03 DIAGNOSIS — H409 Unspecified glaucoma: Secondary | ICD-10-CM

## 2022-01-03 LAB — POC URINALSYSI DIPSTICK (AUTOMATED)
Bilirubin, UA: NEGATIVE
Blood, UA: NEGATIVE
Glucose, UA: POSITIVE — AB
Ketones, UA: NEGATIVE
Leukocytes, UA: NEGATIVE
Nitrite, UA: NEGATIVE
Protein, UA: POSITIVE — AB
Spec Grav, UA: 1.015 (ref 1.010–1.025)
Urobilinogen, UA: 0.2 E.U./dL
pH, UA: 6 (ref 5.0–8.0)

## 2022-01-03 LAB — POCT GLYCOSYLATED HEMOGLOBIN (HGB A1C): Hemoglobin A1C: 6.9 % — AB (ref 4.0–5.6)

## 2022-01-03 MED ORDER — ICOSAPENT ETHYL 1 G PO CAPS: 2.0000 g | ORAL_CAPSULE | Freq: Two times a day (BID) | ORAL | 11 refills | Status: DC

## 2022-01-03 MED ORDER — EMPAGLIFLOZIN 10 MG PO TABS: 10.0000 mg | ORAL_TABLET | Freq: Every day | ORAL | 11 refills | Status: DC

## 2022-01-03 NOTE — Assessment & Plan Note (Signed)
Significant improvement in glycemic control based on today's A1c - congratulated. Continue current regimen. Discussed benefits of jardiance - he will re-price out through [place] and if started discussed this would replace glimepiride. Update UA today. Reassess at 3 mo f/u visit.  ?

## 2022-01-03 NOTE — Assessment & Plan Note (Signed)
Continues lipitor, zetia, lovaza. Notes lovaza price has significantly increased. I asked him to price Vascepa out through [place] in place of lovaza - he will let us know what he finds. ?The ASCVD Risk score (Arnett DK, et al., 2019) failed to calculate for the following reasons: ?  The valid total cholesterol range is 130 to 320 mg/dL d ?

## 2022-01-03 NOTE — Assessment & Plan Note (Signed)
Continued smoker - update UA in actos use.  ?

## 2022-01-03 NOTE — Assessment & Plan Note (Signed)
Chronic, stable on current regimen.  

## 2022-01-03 NOTE — Addendum Note (Signed)
Addended by: Brenton Grills on: 5/63/1497 02:63 AM ? ? Modules accepted: Orders ? ?

## 2022-01-03 NOTE — Progress Notes (Signed)
? ? Patient ID: Evan Avery, male    DOB: [birth year], 76 y.o.   MRN: [id number] ? ?This visit was conducted in person. ? ?BP 134/68   Pulse (!) 56   Temp 97.7 ?F (36.5 ?C) (Temporal)   Ht 5' 9.5" (1.765 m)   Wt 243 lb 2 oz (110.3 kg)   SpO2 95%   BMI 35.39 kg/m?   ? ?CC: DM f/u visit  ?Subjective:  ? ?HPI: ?Evan Avery is a 76 y.o. male presenting on 01/03/2022 for Diabetes (Here for f/u.) ? ? ?Last seen 12/2020.  ? ?Notes progressive bilateral knee pain affecting activity. Sees Dr Mardelle Matte.  ? ?Notes Lovaza price has significantly increased from $30 to $90/3 months.  ? ?DM - does not regularly check sugars - last checked last week 180 fasting with significant fluctuations noted. Compliant with antihyperglycemic regimen which includes: amaryl '4mg'$  daily, metformin '1000mg'$  bid, actos '30mg'$  daily. Vania Rea was unaffordable. Denies low sugars or hypoglycemic symptoms. Denies paresthesias, blurry vision. Last diabetic eye exam he states done this month - through [place] (12/21/2021), they monitor glaucoma. Glucometer brand: unsure. Last foot exam: 03/2020 - DUE. DSME: declined.  ?Lab Results  ?Component Value Date  ? HGBA1C 6.9 (A) 01/03/2022  ? ?Diabetic Foot Exam - Simple   ?Simple Foot Form ?Diabetic Foot exam was performed with the following findings: Yes 01/03/2022  9:49 AM  ?Visual Inspection ?No deformities, no ulcerations, no other skin breakdown bilaterally: Yes ?Sensation Testing ?Intact to touch and monofilament testing bilaterally: Yes ?Pulse Check ?Posterior Tibialis and Dorsalis pulse intact bilaterally: Yes ?Comments ?1+ DP on right 2+ DP on left ?Stucco keratosis bilateral feet ?  ? ?Lab Results  ?Component Value Date  ? MICROALBUR 0.2 12/27/2007  ?  ? ?   ? ?Relevant past medical, surgical, family and social history reviewed and updated as indicated. Interim medical history since our last visit reviewed. ?Allergies and medications reviewed and updated. ?Outpatient Medications Prior to Visit  ?Medication  Sig Dispense Refill  ? atenolol (TENORMIN) 25 MG tablet Take 1 tablet (25 mg total) by mouth every morning AND 0.5 tablets (12.5 mg total) every evening. 180 tablet 3  ? atorvastatin (LIPITOR) 40 MG tablet TAKE 1 TABLET BY MOUTH ONCE DAILY AT  6PM 90 tablet 3  ? Brinzolamide-Brimonidine 1-0.2 % SUSP Apply 1 drop to eye 3 (three) times daily.     ? cetirizine (ZYRTEC) 10 MG tablet Take 10 mg by mouth daily as needed for allergies.    ? clopidogrel (PLAVIX) 75 MG tablet Take 1 tablet by mouth once daily 90 tablet 0  ? Coenzyme Q10 (COQ10) 200 MG CAPS Take 200 mg by mouth daily.    ? ezetimibe (ZETIA) 10 MG tablet Take 1 tablet by mouth once daily 90 tablet 2  ? famotidine (PEPCID) 40 MG tablet Take 1 tablet (40 mg total) by mouth 2 (two) times daily. 60 tablet 11  ? fluticasone (FLONASE) 50 MCG/ACT nasal spray Place 2 sprays into both nostrils daily. 16 g 1  ? gabapentin (NEURONTIN) 100 MG capsule Take 100 mg by mouth 3 (three) times daily.    ? glimepiride (AMARYL) 4 MG tablet Take 1 tablet (4 mg total) by mouth daily with breakfast. 90 tablet 3  ? isosorbide mononitrate (IMDUR) 30 MG 24 hr tablet Take 1 tablet (30 mg total) by mouth daily. 30 tablet 11  ? latanoprost (XALATAN) 0.005 % ophthalmic solution Place 1 drop into both eyes at bedtime.    ?  lisinopril (ZESTRIL) 5 MG tablet Take 1 tablet by mouth once daily 90 tablet 3  ? metFORMIN (GLUCOPHAGE) 1000 MG tablet Take 1 tablet (1,000 mg total) by mouth 2 (two) times daily with a meal. 180 tablet 3  ? nitroGLYCERIN (NITROSTAT) 0.4 MG SL tablet Place 1 tablet (0.4 mg total) under the tongue every 5 (five) minutes as needed for chest pain. 30 tablet 3  ? omega-3 acid ethyl esters (LOVAZA) 1 g capsule Take 2 capsules by mouth twice daily 360 capsule 0  ? pioglitazone (ACTOS) 30 MG tablet Take 1 tablet (30 mg total) by mouth daily. 90 tablet 3  ? senna-docusate (SENOKOT-S) 8.6-50 MG tablet Take 1 tablet by mouth at bedtime as needed for mild constipation. 15 tablet 0   ? ?No facility-administered medications prior to visit.  ?  ? ?Per HPI unless specifically indicated in ROS section below ?Review of Systems ? ?Objective:  ?BP 134/68   Pulse (!) 56   Temp 97.7 ?F (36.5 ?C) (Temporal)   Ht 5' 9.5" (1.765 m)   Wt 243 lb 2 oz (110.3 kg)   SpO2 95%   BMI 35.39 kg/m?   ?Wt Readings from Last 3 Encounters:  ?01/03/22 243 lb 2 oz (110.3 kg)  ?10/25/21 244 lb (110.7 kg)  ?01/10/21 236 lb 7 oz (107.2 kg)  ?  ?  ?Physical Exam ?Vitals and nursing note reviewed.  ?Constitutional:   ?   Appearance: Normal appearance. He is not ill-appearing.  ?Eyes:  ?   Extraocular Movements: Extraocular movements intact.  ?   Conjunctiva/sclera: Conjunctivae normal.  ?   Pupils: Pupils are equal, round, and reactive to light.  ?Cardiovascular:  ?   Rate and Rhythm: Normal rate and regular rhythm.  ?   Pulses: Normal pulses.  ?   Heart sounds: Normal heart sounds. No murmur heard. ?Pulmonary:  ?   Effort: Pulmonary effort is normal. No respiratory distress.  ?   Breath sounds: Normal breath sounds. No wheezing, rhonchi or rales.  ?Musculoskeletal:  ?   Right lower leg: No edema.  ?   Left lower leg: No edema.  ?   Comments: See HPI for foot exam if done  ?Skin: ?   General: Skin is warm and dry.  ?   Findings: No rash.  ?Neurological:  ?   Mental Status: He is alert.  ?Psychiatric:     ?   Mood and Affect: Mood normal.     ?   Behavior: Behavior normal.  ? ?   ?Results for orders placed or performed in visit on 01/03/22  ?POCT glycosylated hemoglobin (Hb A1C)  ?Result Value Ref Range  ? Hemoglobin A1C 6.9 (A) 4.0 - 5.6 %  ? HbA1c POC (<> result, manual entry)    ? HbA1c, POC (prediabetic range)    ? HbA1c, POC (controlled diabetic range)    ? ? ?Assessment & Plan:  ?This visit occurred during the SARS-CoV-2 public health emergency.  Safety protocols were in place, including screening questions prior to the visit, additional usage of staff PPE, and extensive cleaning of exam room while observing  appropriate contact time as indicated for disinfecting solutions.  ? ?Problem List Items Addressed This Visit   ? ? Type 2 diabetes mellitus with other specified complication ([place]) - Primary  ?  Significant improvement in glycemic control based on today's A1c - congratulated. Continue current regimen. Discussed benefits of jardiance - he will re-price out through [place] and if started discussed  this would replace glimepiride. Update UA today. Reassess at 3 mo f/u visit.  ?  ?  ? Relevant Medications  ? empagliflozin (JARDIANCE) 10 MG TABS tablet  ? Other Relevant Orders  ? POCT glycosylated hemoglobin (Hb A1C) (Completed)  ? Hyperlipidemia with target LDL less than 70  ?  Continues lipitor, zetia, lovaza. Notes lovaza price has significantly increased. I asked him to price Vascepa out through [place] in place of lovaza - he will let us know what he finds. ?The ASCVD Risk score (Arnett DK, et al., 2019) failed to calculate for the following reasons: ?  The valid total cholesterol range is 130 to 320 mg/dL d ?  ?  ? Relevant Medications  ? icosapent Ethyl (VASCEPA) 1 g capsule  ? Essential hypertension  ?  Chronic, stable on current regimen.  ?  ?  ? Relevant Medications  ? icosapent Ethyl (VASCEPA) 1 g capsule  ? Obesity, Class I, BMI 30.0-34.9 (see actual BMI)  ? Smoker  ?  Continued smoker - update UA in actos use.  ?  ?  ? Glaucoma  ?  Regularly sees glaucoma specialist through the [place].  ?  ?  ?  ? ?Meds ordered this encounter  ?Medications  ? empagliflozin (JARDIANCE) 10 MG TABS tablet  ?  Sig: Take 1 tablet (10 mg total) by mouth daily before breakfast.  ?  Dispense:  30 tablet  ?  Refill:  11  ? icosapent Ethyl (VASCEPA) 1 g capsule  ?  Sig: Take 2 capsules (2 g total) by mouth 2 (two) times daily.  ?  Dispense:  120 capsule  ?  Refill:  11  ?  Price out in place of Lovaza  ? ?Orders Placed This Encounter  ?Procedures  ? POCT glycosylated hemoglobin (Hb A1C)  ? ? ? ?Patient Instructions  ?Send [place] report  of your latest eye exam.  ?Check on date of latest pneumonia shot from the [place] (and what type you had prevnar-13 vs prevnar-20 vs pneumovax).  ?Urine test today.  ?Continue current medicines, check on pricing of [place]

## 2022-01-03 NOTE — Patient Instructions (Addendum)
Send [place] report of your latest eye exam.  ?Check on date of latest pneumonia shot from the [place] (and what type you had prevnar-13 vs prevnar-20 vs pneumovax).  ?Urine test today.  ?Continue current medicines, check on pricing of Jardiance for diabetes and Vascepa in place of Lovaza.  ?Good to see you today ?Return as needed or in 3 months for wellness visit/physical.  ?

## 2022-01-03 NOTE — Assessment & Plan Note (Signed)
Regularly sees glaucoma specialist through the [place].  ?

## 2022-01-20 ENCOUNTER — Other Ambulatory Visit: Payer: Self-pay | Admitting: Cardiovascular Disease

## 2022-01-20 ENCOUNTER — Other Ambulatory Visit: Payer: Self-pay | Admitting: Family Medicine

## 2022-01-23 NOTE — Telephone Encounter (Signed)
Refill request Plavix ?Last refill 09/21/21 #90 ?Last office visit 01/03/22 ?

## 2022-01-23 NOTE — Telephone Encounter (Signed)
ERx 

## 2022-02-03 ENCOUNTER — Telehealth: Payer: Self-pay | Admitting: Family Medicine

## 2022-02-03 NOTE — Telephone Encounter (Signed)
Rtn call to [facility].  There's an open PA on med and the requested additional info.  Asking what is the medically accepted indication for prescribed drug.  Also, has pt tried/failed alternative Zetia. ? ?Explained pt is currently taking Zetia along with Lovaza, atorvastatin and Vascepa.  States they will call back if they need any other info.  ? ? ?

## 2022-02-03 NOTE — Telephone Encounter (Signed)
Evan Avery with [employer] called stating he has question about medication omega-3 acid ethyl esters (LOVAZA) 1 g capsule for pt. Please advise. ?

## 2022-02-08 ENCOUNTER — Other Ambulatory Visit: Payer: Self-pay | Admitting: Family Medicine

## 2022-02-16 ENCOUNTER — Telehealth: Payer: Self-pay

## 2022-02-16 NOTE — Chronic Care Management (AMB) (Signed)
? ? ?  Chronic Care Management ?Pharmacy Assistant  ? ?Name: Evan Avery  MRN: [id number] DOB: 1946/10/17 ? ?Reason for Encounter: Reminder Call ?  ?Conditions to be addressed/monitored: ?HTN, COPD, and DMII ? ? ? ?Medications: ?Outpatient Encounter Medications as of 02/16/2022  ?Medication Sig Note  ? atenolol (TENORMIN) 25 MG tablet Take 1 tablet (25 mg total) by mouth every morning AND 0.5 tablets (12.5 mg total) every evening.   ? atorvastatin (LIPITOR) 40 MG tablet TAKE 1 TABLET BY MOUTH ONCE DAILY AT  6PM   ? Brinzolamide-Brimonidine 1-0.2 % SUSP Apply 1 drop to eye 3 (three) times daily.    ? cetirizine (ZYRTEC) 10 MG tablet Take 10 mg by mouth daily as needed for allergies.   ? clopidogrel (PLAVIX) 75 MG tablet Take 1 tablet by mouth once daily   ? Coenzyme Q10 (COQ10) 200 MG CAPS Take 200 mg by mouth daily.   ? empagliflozin (JARDIANCE) 10 MG TABS tablet Take 1 tablet (10 mg total) by mouth daily before breakfast.   ? ezetimibe (ZETIA) 10 MG tablet Take 1 tablet by mouth once daily   ? famotidine (PEPCID) 40 MG tablet Take 1 tablet (40 mg total) by mouth 2 (two) times daily.   ? fluticasone (FLONASE) 50 MCG/ACT nasal spray Place 2 sprays into both nostrils daily.   ? gabapentin (NEURONTIN) 100 MG capsule Take 100 mg by mouth 3 (three) times daily.   ? glimepiride (AMARYL) 4 MG tablet Take 1 tablet (4 mg total) by mouth daily with breakfast.   ? icosapent Ethyl (VASCEPA) 1 g capsule Take 2 capsules (2 g total) by mouth 2 (two) times daily.   ? isosorbide mononitrate (IMDUR) 30 MG 24 hr tablet Take 1 tablet (30 mg total) by mouth daily.   ? latanoprost (XALATAN) 0.005 % ophthalmic solution Place 1 drop into both eyes at bedtime.   ? lisinopril (ZESTRIL) 5 MG tablet Take 1 tablet by mouth once daily   ? metFORMIN (GLUCOPHAGE) 1000 MG tablet Take 1 tablet (1,000 mg total) by mouth 2 (two) times daily with a meal.   ? nitroGLYCERIN (NITROSTAT) 0.4 MG SL tablet Place 1 tablet (0.4 mg total) under the tongue  every 5 (five) minutes as needed for chest pain. 07/16/2020: On hand  ? omega-3 acid ethyl esters (LOVAZA) 1 g capsule Take 2 capsules by mouth twice daily   ? pioglitazone (ACTOS) 30 MG tablet Take 1 tablet by mouth once daily   ? senna-docusate (SENOKOT-S) 8.6-50 MG tablet Take 1 tablet by mouth at bedtime as needed for mild constipation.   ? ?No facility-administered encounter medications on file as of 02/16/2022.  ? ?Evan Avery was contacted to remind of upcoming telephone visit with Evan Avery on 02/21/22 at 3:00pm. Patient was reminded to have any blood glucose and blood pressure readings available for review at appointment.  ? ?Message was left reminding patient of appointment. ? ? ? ?Star Rating Drugs: ?Medication:  Last Fill: Day Supply ?Atorvastatin '40mg'$  09/09/21 90 ?Metformin '1000mg'$  09/24/21 90 ?Lisinopril '5mg'$   09/20/21 90 ?Jardiance '25mg'$  04/06/20 90  ?Glimepiride '4mg'$  09/09/21 90 ?Actos '30mg'$   10/09/21 90 ? ?Evan Avery, CPP notified ? ?Evan Avery, CCMA ?Health concierge  ?[phone]  ?

## 2022-02-17 NOTE — Telephone Encounter (Signed)
Received faxed Tier Exception appeal denial.  Reason:  The medication is not eligible for a tier exception (lower cost) as there are no meds in the sam therapeutic class on a lower tier.  Med requested resides at the lowest tier for this class/type of med. Fyi to Dr. Darnell Level.  ?

## 2022-02-19 NOTE — Telephone Encounter (Signed)
He should either be on lovaza or vascepa, not both.  ?Last we discussed, he was to check with [place] pharmacy about pricing for both. Did he check this?  ?

## 2022-02-20 NOTE — Telephone Encounter (Signed)
Patient notified as instructed by telephone and verbalized understanding. Patient stated that he was able to get the Omega-3 for $90/3 month prescriptions at [facility]. Patient stated that he is to pick it up in 5 days. Patient stated that was able to get Jardiance also. ?

## 2022-02-20 NOTE — Telephone Encounter (Signed)
Both lovaza and vascepa are omega 3 - which did he get? May remove other med from his med list.  ?

## 2022-02-20 NOTE — Telephone Encounter (Signed)
Dr. Danise Mina aware that  patient is taking Lovaza which he updated the medication list. ?

## 2022-02-20 NOTE — Addendum Note (Signed)
Addended by: [name] on: 02/20/2022 02:42 PM ? ? Modules accepted: Orders ? ?

## 2022-02-21 ENCOUNTER — Ambulatory Visit (INDEPENDENT_AMBULATORY_CARE_PROVIDER_SITE_OTHER): Payer: HMO | Admitting: Pharmacist

## 2022-02-21 DIAGNOSIS — I10 Essential (primary) hypertension: Secondary | ICD-10-CM

## 2022-02-21 DIAGNOSIS — E785 Hyperlipidemia, unspecified: Secondary | ICD-10-CM

## 2022-02-21 DIAGNOSIS — E1169 Type 2 diabetes mellitus with other specified complication: Secondary | ICD-10-CM

## 2022-02-21 DIAGNOSIS — F17200 Nicotine dependence, unspecified, uncomplicated: Secondary | ICD-10-CM

## 2022-02-21 DIAGNOSIS — I2510 Atherosclerotic heart disease of native coronary artery without angina pectoris: Secondary | ICD-10-CM

## 2022-02-21 NOTE — Progress Notes (Signed)
? ?Chronic Care Management ?Pharmacy Note ? ?02/24/2022 ?Name:  Evan Avery MRN:  [id number] DOB:  09-03-1946 ? ?Summary: CCM F/U visit ?-Pt has started Jardiance and stopped glimepiride ?-Discussed smoking cessation; pt is not ready to quit ?-Pt reports Lovaza generic is $90. TRIG was normal last year; A1c improved, pt may not need Rx Omega-3 products (Lovaza, Vascepa) ? ?Recommendations/Changes made from today's visit: ?-No med changes ?-Consider discontinuing Lovaza/transition to OTC omega-3 product - pt plans to discuss with PCP at upcoming visit ? ?Plan: ?-[facility] will call patient 3 months for DM update ?-Pharmacist follow up televisit scheduled for 6 months ?-CPE 04/11/22 ? ? ?Subjective: ?Evan Avery is an 76 y.o. year old male who is a primary patient of [name], MD.  The CCM team was consulted for assistance with disease management and care coordination needs.   ? ?Engaged with patient by telephone for follow up visit in response to provider referral for pharmacy case management and/or care coordination services.  ? ?Consent to Services:  ?The patient was given information about Chronic Care Management services, agreed to services, and gave verbal consent prior to initiation of services.  Please see initial visit note for detailed documentation.  ? ?Patient Care Team: ?[name], MD as PCP - General (Family Medicine) ?Troy Sine, MD as PCP - Cardiology (Cardiology) ?Troy Sine, MD as Consulting Physician (Cardiology) ?Jackolyn Confer, MD as Consulting Physician (General Surgery) ?Ranisha Allaire, Cleaster Corin, [facility] as Pharmacist (Pharmacist) ? ?Recent office visits: ?01/03/22 Dr Danise Mina OV: f/u DM. Start Jardiance 10 mg ([place]). Start Vascepa ([place]). ? ?Recent consult visits: ?12/22/21 Dr Lynita Lombard ([place]) ? ?10/25/21 Dr [name] (Cardiology): f/u CAD. Start Imdur 30 mg. Change atenolol to 25 mg AM, 12.5 mg PM (HR 43). ? ?Hospital visits: ?None in previous 6  months ? ? ?Objective: ? ?Lab Results  ?Component Value Date  ? CREATININE 1.13 01/04/2021  ? BUN 18 01/04/2021  ? GFR 63.62 01/04/2021  ? GFRNONAA >60 05/15/2018  ? GFRAA >60 05/15/2018  ? NA 141 01/04/2021  ? K 4.5 01/04/2021  ? CALCIUM 9.7 01/04/2021  ? CO2 28 01/04/2021  ? GLUCOSE 217 (H) 01/04/2021  ? ? ?Lab Results  ?Component Value Date/Time  ? HGBA1C 6.9 (A) 01/03/2022 09:57 AM  ? HGBA1C 8.1 (H) 01/04/2021 07:57 AM  ? HGBA1C 7.4 (A) 07/12/2020 08:02 AM  ? HGBA1C 8.2 03/12/2020 12:00 AM  ? HGBA1C 9.2 (H) 01/02/2020 08:35 AM  ? HGBA1C 8.4 02/12/2019 12:00 AM  ? GFR 63.62 01/04/2021 07:57 AM  ? GFR 66.56 09/24/2020 10:02 AM  ? MICROALBUR 0.2 12/27/2007 10:00 AM  ?  ?Last diabetic Eye exam:  ?Lab Results  ?Component Value Date/Time  ? HMDIABEYEEXA No Retinopathy 07/23/2016 12:00 AM  ?  ?Last diabetic Foot exam:  ?Lab Results  ?Component Value Date/Time  ? HMDIABFOOTEX done 08/24/2009 12:00 AM  ?  ? ?Lab Results  ?Component Value Date  ? CHOL 72 01/04/2021  ? HDL 28.40 (L) 01/04/2021  ? [place] 19 01/04/2021  ? LDLDIRECT 61.0 12/27/2018  ? TRIG 125.0 01/04/2021  ? CHOLHDL 3 01/04/2021  ? ? ? ?  Latest Ref Rng & Units 01/04/2021  ?  7:57 AM 03/12/2020  ? 12:00 AM 01/02/2020  ?  8:35 AM  ?Hepatic Function  ?Total Protein 6.0 - 8.3 g/dL 6.3    6.3    ?Albumin 3.5 - 5.2 g/dL 4.0    4.0    ?AST 0 - 37 U/L 14  17      13    ?ALT 0 - 53 U/L 21   35      17    ?Alk Phosphatase 39 - 117 U/L 58   63      64    ?Total Bilirubin 0.2 - 1.2 mg/dL 0.6    0.7    ?Bilirubin, Direct 0.01 - 0.4  0.1        ?  ? This result is from an external source.  ? ? ?Lab Results  ?Component Value Date/Time  ? TSH 0.90 03/12/2020 12:00 AM  ? TSH 1.39 02/18/2019 12:00 AM  ? TSH 1.113 05/16/2016 05:26 PM  ? TSH 1.11 03/13/2016 08:03 AM  ? TSH 1.13 02/28/2011 12:08 PM  ? ? ? ?  Latest Ref Rng & Units 01/04/2021  ?  7:57 AM 03/12/2020  ? 12:00 AM 01/02/2020  ?  8:35 AM  ?CBC  ?WBC 4.0 - 10.5 K/uL 6.8   8.2      7.7    ?Hemoglobin 13.0 - 17.0 g/dL 12.9    14.0      13.6    ?Hematocrit 39.0 - 52.0 % 37.3    39.7    ?Platelets 150.0 - 400.0 K/uL 126.0   133      144.0    ?  ? This result is from an external source.  ? ? ?Lab Results  ?Component Value Date/Time  ? VD25OH 38.51 03/12/2020 12:00 AM  ? VD25OH 43.03 08/09/2018 12:00 AM  ? ? ?Clinical ASCVD: Yes  ?The ASCVD Risk score (Arnett DK, et al., 2019) failed to calculate for the following reasons: ?  The valid total cholesterol range is 130 to 320 mg/dL   ? ? ?  01/10/2021  ?  8:02 AM 01/02/2020  ?  9:47 AM 12/27/2018  ? 12:51 PM  ?Depression screen PHQ 2/9  ?Decreased Interest 0 0 0  ?Down, Depressed, Hopeless 0 0 0  ?PHQ - 2 Score 0 0 0  ?Altered sleeping  0 0  ?Tired, decreased energy  0 0  ?Change in appetite  0 0  ?Feeling bad or failure about yourself   0 0  ?Trouble concentrating  0 0  ?Moving slowly or fidgety/restless  0 0  ?Suicidal thoughts  0 0  ?PHQ-9 Score  0 0  ?Difficult doing work/chores  Not difficult at all Not difficult at all  ?  ? ?Social History  ? ?Tobacco Use  ?Smoking Status Every Day  ? Packs/day: 1.00  ? Years: 50.00  ? Pack years: 50.00  ? Types: Cigarettes  ? Start date: 10/23/1958  ?Smokeless Tobacco Never  ? ?BP Readings from Last 3 Encounters:  ?01/03/22 134/68  ?10/25/21 (!) 154/65  ?01/10/21 (!) 146/58  ? ?Pulse Readings from Last 3 Encounters:  ?01/03/22 (!) 56  ?10/25/21 (!) 43  ?01/10/21 (!) 48  ? ?Wt Readings from Last 3 Encounters:  ?01/03/22 243 lb 2 oz (110.3 kg)  ?10/25/21 244 lb (110.7 kg)  ?01/10/21 236 lb 7 oz (107.2 kg)  ? ?BMI Readings from Last 3 Encounters:  ?01/03/22 35.39 kg/m?  ?10/25/21 35.52 kg/m?  ?01/10/21 36.22 kg/m?  ? ? ?Assessment/Interventions: Review of patient past medical history, allergies, medications, health status, including review of consultants reports, laboratory and other test data, was performed as part of comprehensive evaluation and provision of chronic care management services.  ? ?SDOH:  (Social Determinants of Health) assessments and  interventions performed: Yes ?SDOH Interventions   ? ?  Flowsheet Row Most Recent Value  ?SDOH Interventions   ?Food Insecurity Interventions Intervention Not Indicated  ?Transportation Interventions Intervention Not Indicated  ? ?  ? ?SDOH Screenings  ? ?Alcohol Screen: Not on file  ?Depression (PHQ2-9): Not on file  ?Financial Resource Strain: Low Risk   ? Difficulty of Paying Living Expenses: Not very hard  ?Food Insecurity: No Food Insecurity  ? Worried About Charity fundraiser in the Last Year: Never true  ? Ran Out of Food in the Last Year: Never true  ?Housing: Not on file  ?Physical Activity: Not on file  ?Social Connections: Not on file  ?Stress: Not on file  ?Tobacco Use: High Risk  ? Smoking Tobacco Use: Every Day  ? Smokeless Tobacco Use: Never  ? Passive Exposure: Not on file  ?Transportation Needs: No Transportation Needs  ? Lack of Transportation (Medical): No  ? Lack of Transportation (Non-Medical): No  ? ? ?CCM Care Plan ? ?Allergies  ?Allergen Reactions  ? Dilaudid [Hydromorphone Hcl] Itching  ? Ambien [Zolpidem] Other (See Comments)  ?  Pt states "makes me wild"  ? Niacin And Related Other (See Comments)  ?  Extreme fatigue  ? ? ?Medications Reviewed Today   ? ? Reviewed by Charlton Haws, RPH (Pharmacist) on 02/21/22 at 1525  Med List Status: <None>  ? ?Medication Order Taking? Sig Documenting Provider Last Dose Status Informant  ?atenolol (TENORMIN) 25 MG tablet [id number] Yes Take 1 tablet (25 mg total) by mouth every morning AND 0.5 tablets (12.5 mg total) every evening. Troy Sine, MD Taking Active   ?atorvastatin (LIPITOR) 40 MG tablet [id number] Yes TAKE 1 TABLET BY MOUTH ONCE DAILY AT  Donald Pore, MD Taking Active   ?Brinzolamide-Brimonidine 1-0.2 % SUSP [id number] Yes Apply 1 drop to eye 3 (three) times daily.  [provider] Taking Active Self  ?cetirizine (ZYRTEC) 10 MG tablet [id number] Yes Take 10 mg by mouth daily as needed for allergies. [provider] Taking Active Self  ?clopidogrel (PLAVIX) 75 MG tablet [id number] Yes Take 1 tablet by mouth once daily [name], MD Taking Active   ?Coenzyme Q10 (COQ10) 200 MG CAPS [id number] Yes Take 200 mg by mouth daily. Provider, His

## 2022-02-24 NOTE — Patient Instructions (Signed)
Visit Information ? ?Phone number for Pharmacist: [phone] ? ? Goals Addressed   ?None ?  ? ? ?Care Plan : [place]  ?Updates made by Charlton Haws, RPH since 02/24/2022 12:00 AM  ?  ? ?Problem: Hypertension, Hyperlipidemia, Diabetes, Coronary Artery Disease, and Tobacco use   ?Priority: High  ?  ? ?Long-Range Goal: Disease Management   ?Start Date: 09/05/2021  ?Expected End Date: 02/25/2023  ?This Visit's Progress: On track  ?Priority: High  ?Note:   ?Current Barriers:  ?Unable to quit smoking ?High cost Lovaza ? ?Pharmacist Clinical Goal(s):  ?Patient will contact provider office for questions/concerns as evidenced notation of same in electronic health record through collaboration with PharmD and provider. ? ?Interventions: ?1:1 collaboration with [name], MD regarding development and update of comprehensive plan of care as evidenced by provider attestation and co-signature ?Inter-disciplinary care team collaboration (see longitudinal plan of care) ?Comprehensive medication review performed; medication list updated in electronic medical record ? ?Hypertension (BP goal <130/80) ?-Controlled - BP/HR at goal in recent OV  ?-Current home readings: not checking ?-Aggravating factors: Hx OSA (wears CPAP); smokes 1 PPD, coffee 2-3 cups daily ?-Current treatment: ?Atenolol 25 mg - 1 tab AM, 1/2 tab PM - Appropriate, Effective, Safe, Accessible ?Lisinopril 5 mg daily -Appropriate, Effective, Safe, Accessible ?Isosorbide MN 30 mg daily -Appropriate, Effective, Safe, Accessible ?-Medications previously tried: n/a ?-Current dietary habits: not following any particular diet, wife tries to cook healthy meals  ?-Current exercise habits: none  ?-Educated on BP goals and benefits of medications for prevention of heart attack, stroke and kidney damage; ?-Counseled to monitor BP at home periodically ?-Recommended to continue current medication ? ?Hyperlipidemia / CAD (LDL goal < 55) ?-Controlled - LDL  19 (12/2020) at goal ?-Hx CAD (MI s/p 4 stents); Tie exception for Lovaza denied 01/2022 - cost $90/3 months ?-Current treatment: ?Ezetimibe 10 mg daily -Appropriate, Effective, Safe, Accessible ?Atorvastatin 40 mg daily -Appropriate, Effective, Safe, Accessible ?Clopidogrel 75 mg daily -Appropriate, Effective, Safe, Accessible ?Lovaza 1 g - 2 capsules BID  - Query Appropriate ?Nitroglycerin 0.4 mg SL prn -Appropriate, Effective, Safe, Accessible ?-Medications previously tried: Lovaza   ?-Educated on Cholesterol goals;  ?-Reviewed indication for Lovaza - TRIG were most recently at goal (125- 12/2020) and DM is much better controlled now. He may not need Lovaza any longer. ?-Consider stopping Lovaza / switching to OTC Omega-3 - pt prefers to discuss with PCP at upcoming CPE ? ?Diabetes (A1c goal <7%) ?-Controlled - A1c 6.9% (12/2021) at goal; pt endorses compliance with new Jardiance, eh has stopped glimepiride as instructed ?-Current home glucose readings: none available ?-Current medications: ?Metformin 1000 mg BID - Appropriate, Effective, Safe, Accessible ?Pioglitazone 30 mg daily -Appropriate, Effective, Safe, Accessible ?Jardiance 10 mg daily -Appropriate, Effective, Safe, Accessible ?-Medications previously tried: Ozempic, Januvia (cost), glimepiride  ?-Educated on A1c and blood sugar goals; Carbohydrate counting and/or plate method; Discussed limiting portions and sweets. ?-Recommended to continue current medication ? ?Tobacco use (Goal cessation) ?-Not ideally controlled - pt is smoking 1 PPD; he would like to be able to quit, he has nicotine patches in his cabinet ?-Previous quit attempts: 1990s - quit for 2 years cold [place] ?-Reviewed benefits of smoking cessation (improved BP, DM, lower risk for lung cancer/COPD) ?-Discussed Chantix - reviewed benefits. Pt will contact PCP if he wants to try it. ? ?Patient Goals/Self-Care Activities ?Patient will:  ?- take medications as prescribed as evidenced by patient  report and record review ?check glucose daily,  document, and provide at future appointments ?check blood pressure periodically, document, and provide at future appointments ?  ?  ? ?The patient verbalized understanding of instructions, educational materials, and care plan provided today and declined offer to receive copy of patient instructions, educational materials, and care plan.  ?Telephone follow up appointment with pharmacy team member scheduled for: 6 months ? ?[name], PharmD, BCACP ?Clinical Pharmacist ?[facility] ?[phone] ?  ?

## 2022-03-07 ENCOUNTER — Other Ambulatory Visit: Payer: Self-pay | Admitting: Family Medicine

## 2022-03-07 ENCOUNTER — Other Ambulatory Visit: Payer: Self-pay | Admitting: Cardiovascular Disease

## 2022-03-22 DIAGNOSIS — I10 Essential (primary) hypertension: Secondary | ICD-10-CM

## 2022-03-22 DIAGNOSIS — E785 Hyperlipidemia, unspecified: Secondary | ICD-10-CM

## 2022-03-22 DIAGNOSIS — I2510 Atherosclerotic heart disease of native coronary artery without angina pectoris: Secondary | ICD-10-CM | POA: Diagnosis not present

## 2022-03-22 DIAGNOSIS — E1159 Type 2 diabetes mellitus with other circulatory complications: Secondary | ICD-10-CM | POA: Diagnosis not present

## 2022-03-22 DIAGNOSIS — Z7984 Long term (current) use of oral hypoglycemic drugs: Secondary | ICD-10-CM

## 2022-03-22 DIAGNOSIS — F17210 Nicotine dependence, cigarettes, uncomplicated: Secondary | ICD-10-CM | POA: Diagnosis not present

## 2022-03-31 ENCOUNTER — Other Ambulatory Visit: Payer: No Typology Code available for payment source

## 2022-04-03 ENCOUNTER — Other Ambulatory Visit: Payer: No Typology Code available for payment source

## 2022-04-09 ENCOUNTER — Other Ambulatory Visit: Payer: Self-pay | Admitting: Family Medicine

## 2022-04-10 NOTE — Telephone Encounter (Signed)
Plavix Last filled:  01/10/21, #90 Last OV:  01/03/22, f/u Next OV:  06/06/22, AWV

## 2022-04-11 ENCOUNTER — Ambulatory Visit: Payer: No Typology Code available for payment source | Admitting: Family Medicine

## 2022-05-03 ENCOUNTER — Other Ambulatory Visit: Payer: Self-pay | Admitting: Family Medicine

## 2022-05-08 ENCOUNTER — Ambulatory Visit: Payer: No Typology Code available for payment source | Admitting: Family Medicine

## 2022-05-27 ENCOUNTER — Other Ambulatory Visit: Payer: Self-pay | Admitting: Family Medicine

## 2022-05-27 DIAGNOSIS — E1169 Type 2 diabetes mellitus with other specified complication: Secondary | ICD-10-CM

## 2022-05-27 DIAGNOSIS — E785 Hyperlipidemia, unspecified: Secondary | ICD-10-CM

## 2022-05-27 DIAGNOSIS — D3502 Benign neoplasm of left adrenal gland: Secondary | ICD-10-CM

## 2022-05-27 DIAGNOSIS — Z77098 Contact with and (suspected) exposure to other hazardous, chiefly nonmedicinal, chemicals: Secondary | ICD-10-CM

## 2022-05-30 ENCOUNTER — Other Ambulatory Visit (INDEPENDENT_AMBULATORY_CARE_PROVIDER_SITE_OTHER): Payer: HMO

## 2022-05-30 DIAGNOSIS — Z77098 Contact with and (suspected) exposure to other hazardous, chiefly nonmedicinal, chemicals: Secondary | ICD-10-CM | POA: Diagnosis not present

## 2022-05-30 DIAGNOSIS — D3502 Benign neoplasm of left adrenal gland: Secondary | ICD-10-CM | POA: Diagnosis not present

## 2022-05-30 DIAGNOSIS — E1169 Type 2 diabetes mellitus with other specified complication: Secondary | ICD-10-CM

## 2022-05-30 DIAGNOSIS — E785 Hyperlipidemia, unspecified: Secondary | ICD-10-CM

## 2022-05-30 LAB — COMPREHENSIVE METABOLIC PANEL
ALT: 14 U/L (ref 0–53)
AST: 15 U/L (ref 0–37)
Albumin: 4.4 g/dL (ref 3.5–5.2)
Alkaline Phosphatase: 52 U/L (ref 39–117)
BUN: 23 mg/dL (ref 6–23)
CO2: 27 mEq/L (ref 19–32)
Calcium: 9.9 mg/dL (ref 8.4–10.5)
Chloride: 105 mEq/L (ref 96–112)
Creatinine, Ser: 1.37 mg/dL (ref 0.40–1.50)
GFR: 50 mL/min — ABNORMAL LOW (ref >60.00)
Glucose, Bld: 183 mg/dL — ABNORMAL HIGH (ref 70–99)
Potassium: 4.7 mEq/L (ref 3.5–5.1)
Sodium: 140 mEq/L (ref 135–145)
Total Bilirubin: 0.6 mg/dL (ref 0.2–1.2)
Total Protein: 6.9 g/dL (ref 6.0–8.3)

## 2022-05-30 LAB — MICROALBUMIN / CREATININE URINE RATIO
Creatinine,U: 59 mg/dL
Microalb Creat Ratio: 13.3 mg/g (ref 0.0–30.0)
Microalb, Ur: 7.8 mg/dL — ABNORMAL HIGH (ref 0.0–1.9)

## 2022-05-30 LAB — CBC WITH DIFFERENTIAL/PLATELET
Basophils Absolute: 0.1 10*3/uL (ref 0.0–0.1)
Basophils Relative: 1.2 % (ref 0.0–3.0)
Eosinophils Absolute: 0.3 10*3/uL (ref 0.0–0.7)
Eosinophils Relative: 4.2 % (ref 0.0–5.0)
HCT: 38.1 % — ABNORMAL LOW (ref 39.0–52.0)
Hemoglobin: 13.3 g/dL (ref 13.0–17.0)
Lymphocytes Relative: 30.3 % (ref 12.0–46.0)
Lymphs Abs: 1.8 10*3/uL (ref 0.7–4.0)
MCHC: 34.9 g/dL (ref 30.0–36.0)
MCV: 94.6 fl (ref 78.0–100.0)
Monocytes Absolute: 0.4 10*3/uL (ref 0.1–1.0)
Monocytes Relative: 7.4 % (ref 3.0–12.0)
Neutro Abs: 3.4 10*3/uL (ref 1.4–7.7)
Neutrophils Relative %: 56.9 % (ref 43.0–77.0)
Platelets: 122 10*3/uL — ABNORMAL LOW (ref 150.0–400.0)
RBC: 4.02 Mil/uL — ABNORMAL LOW (ref 4.22–5.81)
RDW: 14.3 % (ref 11.5–15.5)
WBC: 5.9 10*3/uL (ref 4.0–10.5)

## 2022-05-30 LAB — LIPID PANEL
Cholesterol: 91 mg/dL (ref 0–200)
HDL: 33.9 mg/dL — ABNORMAL LOW (ref >39.00)
LDL Cholesterol: 20 mg/dL (ref 0–99)
NonHDL: 57.39
Total CHOL/HDL Ratio: 3
Triglycerides: 185 mg/dL — ABNORMAL HIGH (ref 0.0–149.0)
VLDL: 37 mg/dL (ref 0.0–40.0)

## 2022-05-30 LAB — HEMOGLOBIN A1C: Hgb A1c MFr Bld: 8.8 % — ABNORMAL HIGH (ref 4.6–6.5)

## 2022-05-31 LAB — DHEA-SULFATE: DHEA-SO4: 41 ug/dL (ref 3–225)

## 2022-06-02 ENCOUNTER — Telehealth: Payer: Self-pay

## 2022-06-02 NOTE — Chronic Care Management (AMB) (Unsigned)
Chronic Care Management Pharmacy Assistant   Name: Evan Avery  MRN: [id number] DOB: [birth year]  Reason for Encounter:Diabetes Disease State  Unsuccessful outreach will try again next month   Recent office visits:  06/06/22-Evan Gutierrez,MD(PCP)-AWV- labs discussed Will restart glimepiride '2mg'$  daily. F/u 4 months  Recent consult visits:  03/02/22- Evan Avery-labs,xrays,DM foot exam,Start: Gabapentin/Neurontin  100 mg 3 times daily-Patient wants a Evan GI consult. F/u 1 year 02/22/22-Evan Avery- eye exam-no medication changes  Hospital visits:  None in previous 6 months  Medications: Outpatient Encounter Medications as of 06/02/2022  Medication Sig Note   atenolol (TENORMIN) 25 MG tablet Take 1 tablet (25 mg total) by mouth every morning AND 0.5 tablets (12.5 mg total) every evening.    atorvastatin (LIPITOR) 40 MG tablet TAKE 1 TABLET BY MOUTH ONCE DAILY AT  6PM    Brinzolamide-Brimonidine 1-0.2 % SUSP Apply 1 drop to eye 3 (three) times daily.     cetirizine (ZYRTEC) 10 MG tablet Take 10 mg by mouth daily as needed for allergies.    clopidogrel (PLAVIX) 75 MG tablet Take 1 tablet by mouth once daily    Coenzyme Q10 (COQ10) 200 MG CAPS Take 200 mg by mouth daily.    empagliflozin (JARDIANCE) 10 MG TABS tablet Take 1 tablet (10 mg total) by mouth daily before breakfast.    ezetimibe (ZETIA) 10 MG tablet Take 1 tablet by mouth once daily    famotidine (PEPCID) 40 MG tablet Take 1 tablet (40 mg total) by mouth 2 (two) times daily.    fluticasone (FLONASE) 50 MCG/ACT nasal spray Place 2 sprays into both nostrils daily.    gabapentin (NEURONTIN) 100 MG capsule Take 100 mg by mouth 3 (three) times daily.    isosorbide mononitrate (IMDUR) 30 MG 24 hr tablet Take 1 tablet (30 mg total) by mouth daily.    latanoprost (XALATAN) 0.005 % ophthalmic solution Place 1 drop into both eyes at bedtime.    lisinopril (ZESTRIL) 5 MG tablet Take 1 tablet by mouth once daily     metFORMIN (GLUCOPHAGE) 1000 MG tablet TAKE 1 TABLET BY MOUTH TWICE DAILY WITH MEALS    nitroGLYCERIN (NITROSTAT) 0.4 MG SL tablet Place 1 tablet (0.4 mg total) under the tongue every 5 (five) minutes as needed for chest pain. 07/16/2020: On hand   omega-3 acid ethyl esters (LOVAZA) 1 g capsule Take 2 capsules by mouth twice daily    pioglitazone (ACTOS) 30 MG tablet Take 1 tablet by mouth once daily    senna-docusate (SENOKOT-S) 8.6-50 MG tablet Take 1 tablet by mouth at bedtime as needed for mild constipation.    No facility-administered encounter medications on file as of 06/02/2022.     Recent Relevant Labs: Lab Results  Component Value Date/Time   HGBA1C 8.8 (H) 05/30/2022 07:26 AM   HGBA1C 6.9 (A) 01/03/2022 09:57 AM   HGBA1C 8.1 (H) 01/04/2021 07:57 AM   HGBA1C 8.2 03/12/2020 12:00 AM   HGBA1C 8.4 02/12/2019 12:00 AM   MICROALBUR 7.8 (H) 05/30/2022 07:26 AM   MICROALBUR 0.2 12/27/2007 10:00 AM    Kidney Function Lab Results  Component Value Date/Time   CREATININE 1.37 05/30/2022 07:26 AM   CREATININE 1.13 01/04/2021 07:57 AM   CREATININE 0.96 03/13/2016 08:03 AM   GFR 50.00 (L) 05/30/2022 07:26 AM   GFRNONAA >60 05/15/2018 03:27 PM   GFRAA >60 05/15/2018 03:27 PM     Going to call next week for BG's- unsuccessful outreach, will try again next month  Current antihyperglycemic regimen:  Metformin 1000 mg BID  Pioglitazone 30 mg daily  Jardiance 10 mg daily Glimepiride '2mg'$ -take 1 tablet before breakfast    Patient verbally confirms he is taking the above medications as directed. Yes  What diet changes have been made to improve diabetes control? Limiting sweets, eating smaller portions   What recent interventions/DTPs have been made to improve glycemic control:  Now back on jardiance - as [place] was unaffordable.Latest A1c elevated to 8.8%  Have there been any recent hospitalizations or ED visits since last visit with CPP? No  Patient denies hypoglycemic symptoms,  including Pale, Sweaty, Shaky, Hungry, Nervous/irritable, and Vision changes  Patient denies hyperglycemic symptoms, including blurry vision, excessive thirst, fatigue, polyuria, and weakness  How often are you checking your blood sugar?  Infrequently   What are your blood sugars ranging?   During the week, how often does your blood glucose drop below 70? Never  Are you checking your feet daily/regularly? Yes  Adherence Review: Is the patient currently on a STATIN medication? Yes Is the patient currently on ACE/ARB medication? Yes Does the patient have >5 day gap between last estimated fill dates? Yes- unable to reach patient to discuss  Care Gaps: Annual wellness visit in last year? Yes Most recent A1C reading:8.8    05/30/22 Most Recent BP reading:134/62  06/06/22  Last eye exam / retinopathy screening: UTD-Evan Last diabetic foot exam:UTD  Counseled patient on importance of annual eye and foot exam. UTD  Star Rating Drugs:  Medication:  Last Fill: Day Supply Atorvastatin '40mg'$  03/07/22 90 Lisinopril '5mg'$   03/14/22 90 Metformin '1000mg'$  04/10/22 90 Actos '30mg'$   04/21/22 90 Jardiance '10mg'$  05/31/22  30 Glimepiride '2mg'$  06/06/22 90  PCP appointment on 06/06/22 and CCM appointment on 08/23/22  Evan Avery, CPP notified  [name], [place]  [phone]

## 2022-06-03 LAB — ALDOSTERONE + RENIN ACTIVITY W/ RATIO
Aldosterone: <1 ng/dL
Renin Activity: 5.55 ng/mL/h (ref 0.25–5.82)

## 2022-06-04 ENCOUNTER — Other Ambulatory Visit: Payer: Self-pay | Admitting: Cardiovascular Disease

## 2022-06-06 ENCOUNTER — Encounter: Payer: Self-pay | Admitting: Family Medicine

## 2022-06-06 ENCOUNTER — Ambulatory Visit (INDEPENDENT_AMBULATORY_CARE_PROVIDER_SITE_OTHER): Payer: HMO | Admitting: Family Medicine

## 2022-06-06 VITALS — BP 134/62 | HR 47 | Temp 97.5°F | Ht 67.5 in | Wt 228.1 lb

## 2022-06-06 DIAGNOSIS — Z0000 Encounter for general adult medical examination without abnormal findings: Secondary | ICD-10-CM

## 2022-06-06 DIAGNOSIS — G4733 Obstructive sleep apnea (adult) (pediatric): Secondary | ICD-10-CM

## 2022-06-06 DIAGNOSIS — M79671 Pain in right foot: Secondary | ICD-10-CM

## 2022-06-06 DIAGNOSIS — M79672 Pain in left foot: Secondary | ICD-10-CM

## 2022-06-06 DIAGNOSIS — F17200 Nicotine dependence, unspecified, uncomplicated: Secondary | ICD-10-CM | POA: Diagnosis not present

## 2022-06-06 DIAGNOSIS — K219 Gastro-esophageal reflux disease without esophagitis: Secondary | ICD-10-CM

## 2022-06-06 DIAGNOSIS — Z77098 Contact with and (suspected) exposure to other hazardous, chiefly nonmedicinal, chemicals: Secondary | ICD-10-CM

## 2022-06-06 DIAGNOSIS — I771 Stricture of artery: Secondary | ICD-10-CM

## 2022-06-06 DIAGNOSIS — I10 Essential (primary) hypertension: Secondary | ICD-10-CM | POA: Diagnosis not present

## 2022-06-06 DIAGNOSIS — J432 Centrilobular emphysema: Secondary | ICD-10-CM

## 2022-06-06 DIAGNOSIS — I700 Atherosclerosis of aorta: Secondary | ICD-10-CM

## 2022-06-06 DIAGNOSIS — E785 Hyperlipidemia, unspecified: Secondary | ICD-10-CM

## 2022-06-06 DIAGNOSIS — D696 Thrombocytopenia, unspecified: Secondary | ICD-10-CM

## 2022-06-06 DIAGNOSIS — R001 Bradycardia, unspecified: Secondary | ICD-10-CM

## 2022-06-06 DIAGNOSIS — H401132 Primary open-angle glaucoma, bilateral, moderate stage: Secondary | ICD-10-CM | POA: Diagnosis not present

## 2022-06-06 DIAGNOSIS — M159 Polyosteoarthritis, unspecified: Secondary | ICD-10-CM

## 2022-06-06 DIAGNOSIS — Z9989 Dependence on other enabling machines and devices: Secondary | ICD-10-CM

## 2022-06-06 DIAGNOSIS — E1169 Type 2 diabetes mellitus with other specified complication: Secondary | ICD-10-CM | POA: Diagnosis not present

## 2022-06-06 DIAGNOSIS — I2510 Atherosclerotic heart disease of native coronary artery without angina pectoris: Secondary | ICD-10-CM | POA: Diagnosis not present

## 2022-06-06 DIAGNOSIS — D3502 Benign neoplasm of left adrenal gland: Secondary | ICD-10-CM

## 2022-06-06 DIAGNOSIS — Z7189 Other specified counseling: Secondary | ICD-10-CM | POA: Diagnosis not present

## 2022-06-06 DIAGNOSIS — K439 Ventral hernia without obstruction or gangrene: Secondary | ICD-10-CM

## 2022-06-06 DIAGNOSIS — E1121 Type 2 diabetes mellitus with diabetic nephropathy: Secondary | ICD-10-CM

## 2022-06-06 MED ORDER — GLIMEPIRIDE 2 MG PO TABS: 2.0000 mg | ORAL_TABLET | Freq: Every day | ORAL | 3 refills | Status: DC

## 2022-06-06 NOTE — Assessment & Plan Note (Signed)
Preventative protocols reviewed and updated unless pt declined. Discussed healthy diet and lifestyle.  

## 2022-06-06 NOTE — Assessment & Plan Note (Signed)
[facility]

## 2022-06-06 NOTE — Assessment & Plan Note (Addendum)
Advanced directive discussion - does not want prolonged life support. Would want wife to be HCPOA. Packet previously provided, again today.

## 2022-06-06 NOTE — Progress Notes (Signed)
Patient ID: Evan Avery, male    DOB: Feb 13, 1946, 76 y.o.   MRN: [id number]  This visit was conducted in person.  BP 134/62   Pulse (!) 47   Temp (!) 97.5 F (36.4 C) (Temporal)   Ht 5' 7.5" (1.715 m)   Wt 228 lb 2 oz (103.5 kg)   SpO2 95%   BMI 35.20 kg/m    CC: CPE Subjective:   HPI: Evan Avery is a 76 y.o. male presenting on 06/06/2022 for Annual Exam (Provided recent lab and x-ray results [made copies].  Also, needs  Disability Parking Placard form completed. )   Did not see health advisor this year.   No results found.  [place] Visit from 06/06/2022 in [facility] [place]  PHQ-2 Total Score 0          01/10/2021    8:02 AM 01/02/2020    9:46 AM 12/27/2018   12:51 PM 12/14/2017    9:32 AM 12/06/2016    8:17 AM  Fall Risk   Falls in the past year? 0 0 0 No No  Number falls in past yr:  0     Injury with Fall?  0     Risk for fall due to :  Medication side effect     Follow up Falls evaluation completed Falls evaluation completed;Falls prevention discussed     POAG, severe OD, mod OS - managing with eye drops[facility]. Discussing laser surgery for glaucoma - in [place]. Eye drop dose increased. Did not have diabetic eye exam at that time (last done 12/2021).   [facility] - gabapentin dose dropped to 100-'200mg'$  nightly (neuropathy).   DM - continues metformin '1000mg'$  bid, actos '30mg'$  daily. Now back on jardiance - as [place] was unaffordable. Tolerating well without UTI symptoms. No longer taking glimepiride. Latest A1c elevated to 8.8%. Denies diet changes.   HLD - on lipitor '40mg'$  daily and lovaza 2gm BID - however concerned with cost of lovaza ($90/mo).   Preventative: COLONOSCOPY 08/2020 - multiple polpys removed (TA, HP), 1.5cm polyp biopsied (SSP), colon lipoma Ardis Hughs)  COLONOSCOPY 10/2020 - 1.5cm SSP, HP, rpt 2 yrs Ardis Hughs) Prostate cancer screening - aged out Lung cancer  screening - undergoing first done 03/2019. Has decided to stop.  Flu shot yearly  [place] 11/2019, 12/2019, no booster Tdap 2014 , Td 09/2021 Pneumovax 2014, HERDEYC-14 2015  shingrix - discussed, declines. Never had chicken pox.  Advanced directive discussion - does not want prolonged life support. Would want wife to be HCPOA. Packet previously provided, again today.  Seat belt use discussed  Sunscreen use discussed, dark spot on scalp - saw dermatologist, benign. Ex smoker - quit 04/2016 - restarted 1 ppd. 50+ PY. Has nicotine patches at home.  Alcohol - rare Dentist q6 mo - upper dentures Eye exam Q3-4 mo through [place] - glaucoma  Bowel - no constipation, some loose stools  Bladder - no incontinence    Lives with wife and son Occupation: retired, was Multimedia programmer for [employer] Activity: no regular exercise due to foot pain Diet: good water, fruits/vegetables daily      Relevant past medical, surgical, family and social history reviewed and updated as indicated. Interim medical history since our last visit reviewed. Allergies and medications reviewed and updated. Outpatient Medications Prior to Visit  Medication Sig Dispense Refill  . atenolol (TENORMIN) 25 MG tablet Take 1  tablet (25 mg total) by mouth every morning AND 0.5 tablets (12.5 mg total) every evening. 180 tablet 3  . atorvastatin (LIPITOR) 40 MG tablet TAKE 1 TABLET BY MOUTH ONCE DAILY AT  6PM 90 tablet 3  . Brinzolamide-Brimonidine 1-0.2 % SUSP Apply 1 drop to eye 3 (three) times daily.     . cetirizine (ZYRTEC) 10 MG tablet Take 10 mg by mouth daily as needed for allergies.    Marland Kitchen clopidogrel (PLAVIX) 75 MG tablet Take 1 tablet by mouth once daily 90 tablet 1  . Coenzyme Q10 (COQ10) 200 MG CAPS Take 200 mg by mouth daily.    . empagliflozin (JARDIANCE) 10 MG TABS tablet Take 1 tablet (10 mg total) by mouth daily before breakfast. 30 tablet 11  . ezetimibe (ZETIA) 10 MG tablet Take 1 tablet by mouth once daily  90 tablet 3  . famotidine (PEPCID) 40 MG tablet Take 1 tablet (40 mg total) by mouth 2 (two) times daily. 60 tablet 11  . fluticasone (FLONASE) 50 MCG/ACT nasal spray Place 2 sprays into both nostrils daily. 16 g 1  . gabapentin (NEURONTIN) 100 MG capsule Take 100 mg by mouth 3 (three) times daily.    . isosorbide mononitrate (IMDUR) 30 MG 24 hr tablet Take 1 tablet (30 mg total) by mouth daily. 30 tablet 11  . latanoprost (XALATAN) 0.005 % ophthalmic solution Place 1 drop into both eyes at bedtime.    Marland Kitchen lisinopril (ZESTRIL) 5 MG tablet Take 1 tablet by mouth once daily 90 tablet 0  . metFORMIN (GLUCOPHAGE) 1000 MG tablet TAKE 1 TABLET BY MOUTH TWICE DAILY WITH MEALS 180 tablet 0  . nitroGLYCERIN (NITROSTAT) 0.4 MG SL tablet Place 1 tablet (0.4 mg total) under the tongue every 5 (five) minutes as needed for chest pain. 30 tablet 3  . omega-3 acid ethyl esters (LOVAZA) 1 g capsule Take 2 capsules by mouth twice daily 360 capsule 0  . pioglitazone (ACTOS) 30 MG tablet Take 1 tablet by mouth once daily 90 tablet 1  . senna-docusate (SENOKOT-S) 8.6-50 MG tablet Take 1 tablet by mouth at bedtime as needed for mild constipation. 15 tablet 0  . TURMERIC CURCUMIN PO Take by mouth daily.     No facility-administered medications prior to visit.     Per HPI unless specifically indicated in ROS section below Review of Systems  Constitutional:  Negative for activity change, appetite change, chills, fatigue, fever and unexpected weight change.  HENT:  Negative for hearing loss.   Eyes:  Negative for visual disturbance.  Respiratory:  Negative for cough, chest tightness, shortness of breath and wheezing.   Cardiovascular:  Negative for chest pain, palpitations and leg swelling.  Gastrointestinal:  Positive for diarrhea. Negative for abdominal distention, abdominal pain, blood in stool, constipation, nausea and vomiting.  Genitourinary:  Negative for difficulty urinating and hematuria.  Musculoskeletal:   Negative for arthralgias, myalgias and neck pain.  Skin:  Negative for rash.  Neurological:  Negative for dizziness, seizures, syncope and headaches.  Hematological:  Negative for adenopathy. Bruises/bleeds easily.  Psychiatric/Behavioral:  Negative for dysphoric mood. The patient is not nervous/anxious.     Objective:  BP 134/62   Pulse (!) 47   Temp (!) 97.5 F (36.4 C) (Temporal)   Ht 5' 7.5" (1.715 m)   Wt 228 lb 2 oz (103.5 kg)   SpO2 95%   BMI 35.20 kg/m   Wt Readings from Last 3 Encounters:  06/06/22 228 lb 2 oz (103.5 kg)  01/03/22 243 lb 2 oz (110.3 kg)  10/25/21 244 lb (110.7 kg)      Physical Exam Vitals and nursing note reviewed.  Constitutional:      General: He is not in acute distress.    Appearance: Normal appearance. He is well-developed. He is not ill-appearing.  HENT:     Head: Normocephalic and atraumatic.     Right Ear: Hearing, tympanic membrane, ear canal and external ear normal.     Left Ear: Hearing, tympanic membrane, ear canal and external ear normal.  Eyes:     General: No scleral icterus.    Extraocular Movements: Extraocular movements intact.     Conjunctiva/sclera: Conjunctivae normal.     Pupils: Pupils are equal, round, and reactive to light.  Neck:     Thyroid: No thyroid mass or thyromegaly.     Vascular: Carotid bruit (R sided bruit) present.  Cardiovascular:     Rate and Rhythm: Normal rate and regular rhythm.     Pulses: Normal pulses.          Radial pulses are 2+ on the right side and 2+ on the left side.     Heart sounds: Normal heart sounds. No murmur heard. Pulmonary:     Effort: Pulmonary effort is normal. No respiratory distress.     Breath sounds: Normal breath sounds. No wheezing, rhonchi or rales.  Abdominal:     General: Bowel sounds are normal. There is no distension.     Palpations: Abdomen is soft. There is no mass.     Tenderness: There is no abdominal tenderness. There is no guarding or rebound.     Hernia: A  hernia (R paraumbilical) is present.  Musculoskeletal:        General: Normal range of motion.     Cervical back: Normal range of motion and neck supple.     Right lower leg: No edema.     Left lower leg: No edema.  Lymphadenopathy:     Cervical: No cervical adenopathy.  Skin:    General: Skin is warm and dry.     Findings: No rash.  Neurological:     General: No focal deficit present.     Mental Status: He is alert and oriented to person, place, and time.  Psychiatric:        Mood and Affect: Mood normal.        Behavior: Behavior normal.        Thought Content: Thought content normal.        Judgment: Judgment normal.      Results for orders placed or performed in visit on 05/30/22  Aldosterone + renin activity w/ ratio  Result Value Ref Range   Aldosterone <1 *** ng/dL   Renin Activity 5.55 0.25 - 5.82 ng/mL/h   ALDO / PRA Ratio see note   DHEA-sulfate  Result Value Ref Range   DHEA-SO4 41 3 - 225 mcg/dL  Microalbumin / creatinine urine ratio  Result Value Ref Range   Microalb, Ur 7.8 (H) 0.0 - 1.9 mg/dL   Creatinine,U 59.0 mg/dL   Microalb Creat Ratio 13.3 0.0 - 30.0 mg/g  CBC with Differential/Platelet  Result Value Ref Range   WBC 5.9 4.0 - 10.5 K/uL   RBC 4.02 (L) 4.22 - 5.81 Mil/uL   Hemoglobin 13.3 13.0 - 17.0 g/dL   HCT 38.1 (L) 39.0 - 52.0 %   MCV 94.6 78.0 - 100.0 fl   MCHC 34.9 30.0 - 36.0 g/dL  RDW 14.3 11.5 - 15.5 %   Platelets 122.0 (L) 150.0 - 400.0 K/uL   Neutrophils Relative % 56.9 43.0 - 77.0 %   Lymphocytes Relative 30.3 12.0 - 46.0 %   Monocytes Relative 7.4 3.0 - 12.0 %   Eosinophils Relative 4.2 0.0 - 5.0 %   Basophils Relative 1.2 0.0 - 3.0 %   Neutro Abs 3.4 1.4 - 7.7 K/uL   Lymphs Abs 1.8 0.7 - 4.0 K/uL   Monocytes Absolute 0.4 0.1 - 1.0 K/uL   Eosinophils Absolute 0.3 0.0 - 0.7 K/uL   Basophils Absolute 0.1 0.0 - 0.1 K/uL  Hemoglobin A1c  Result Value Ref Range   Hgb A1c MFr Bld 8.8 (H) 4.6 - 6.5 %  Comprehensive metabolic panel   Result Value Ref Range   Sodium 140 135 - 145 mEq/L   Potassium 4.7 3.5 - 5.1 mEq/L   Chloride 105 96 - 112 mEq/L   CO2 27 19 - 32 mEq/L   Glucose, Bld 183 (H) 70 - 99 mg/dL   BUN 23 6 - 23 mg/dL   Creatinine, Ser 1.37 0.40 - 1.50 mg/dL   Total Bilirubin 0.6 0.2 - 1.2 mg/dL   Alkaline Phosphatase 52 39 - 117 U/L   AST 15 0 - 37 U/L   ALT 14 0 - 53 U/L   Total Protein 6.9 6.0 - 8.3 g/dL   Albumin 4.4 3.5 - 5.2 g/dL   GFR 50.00 (L) >60.00 mL/min   Calcium 9.9 8.4 - 10.5 mg/dL  Lipid panel  Result Value Ref Range   Cholesterol 91 0 - 200 mg/dL   Triglycerides 185.0 (H) 0.0 - 149.0 mg/dL   HDL 33.90 (L) >39.00 mg/dL   VLDL 37.0 0.0 - 40.0 mg/dL   LDL Cholesterol 20 0 - 99 mg/dL   Total CHOL/HDL Ratio 3    NonHDL 57.39     Assessment & Plan:   Problem List Items Addressed This Visit   None    No orders of the defined types were placed in this encounter.  No orders of the defined types were placed in this encounter.    Patient instructions: If interested, check with pharmacy about new 2 shot shingles series (shingrix).  Sugar was higher - start glimepiride '2mg'$  daily (1/2 tablet of what you have at home- new dose will be sent to pharmacy).  Work on advanced directive - new packet provided today.  We will call you to schedule carotid artery ultrasound.  Return as needed or in 3-4 months for follow up visit.   Follow up plan: No follow-ups on file.  [name], MD

## 2022-06-06 NOTE — Assessment & Plan Note (Signed)
Chronic, deteriorated despite recently starting jardiance.  Will restart glimepiride '2mg'$  daily.  Reassess at 23moDM f/u visit.

## 2022-06-06 NOTE — Assessment & Plan Note (Signed)
Chronic, persists. ?neuropathy related - now on gabapentin 100-'200mg'$  nightly - continue

## 2022-06-06 NOTE — Assessment & Plan Note (Signed)
Chronic, stable period on lipitor, zetia, lovaza.  The ASCVD Risk score (Arnett DK, et al., 2019) failed to calculate for the following reasons:   The valid total cholesterol range is 130 to 320 mg/dL

## 2022-06-06 NOTE — Assessment & Plan Note (Deleted)

## 2022-06-06 NOTE — Assessment & Plan Note (Signed)
Continue CPAP, followed by [place].

## 2022-06-06 NOTE — Assessment & Plan Note (Addendum)
Ex smoker - quit 2017. Declines lung cancer screening program.

## 2022-06-06 NOTE — Assessment & Plan Note (Signed)
Stable period on current regimen.

## 2022-06-06 NOTE — Assessment & Plan Note (Signed)
Mild by imaging, continues albuterol PRN.

## 2022-06-06 NOTE — Assessment & Plan Note (Signed)
Chronic, persistent thrombocytopenia.

## 2022-06-06 NOTE — Assessment & Plan Note (Signed)
Weight loss noted. Despite this sugar levels elevated - see above.

## 2022-06-06 NOTE — Patient Instructions (Addendum)
If interested, check with pharmacy about new 2 shot shingles series (shingrix).  Sugar was higher - start glimepiride '2mg'$  daily (1/2 tablet of what you have at home- new dose will be sent to pharmacy).  Work on advanced directive - new packet provided today.  We will call you to schedule carotid artery ultrasound.  Return as needed or in 3-4 months for follow up visit.   Health Maintenance After Age 77 After age 41, you are at a higher risk for certain long-term diseases and infections as well as injuries from falls. Falls are a major cause of broken bones and head injuries in people who are older than age 73. Getting regular preventive care can help to keep you healthy and well. Preventive care includes getting regular testing and making lifestyle changes as recommended by your health care provider. Talk with your health care provider about: Which screenings and tests you should have. A screening is a test that checks for a disease when you have no symptoms. A diet and exercise plan that is right for you. What should I know about screenings and tests to prevent falls? Screening and testing are the best ways to find a health problem early. Early diagnosis and treatment give you the best chance of managing medical conditions that are common after age 77. Certain conditions and lifestyle choices may make you more likely to have a fall. Your health care provider may recommend: Regular vision checks. Poor vision and conditions such as cataracts can make you more likely to have a fall. If you wear glasses, make sure to get your prescription updated if your vision changes. Medicine review. Work with your health care provider to regularly review all of the medicines you are taking, including over-the-counter medicines. Ask your health care provider about any side effects that may make you more likely to have a fall. Tell your health care provider if any medicines that you take make you feel dizzy or  sleepy. Strength and balance checks. Your health care provider may recommend certain tests to check your strength and balance while standing, walking, or changing positions. Foot health exam. Foot pain and numbness, as well as not wearing proper footwear, can make you more likely to have a fall. Screenings, including: Osteoporosis screening. Osteoporosis is a condition that causes the bones to get weaker and break more easily. Blood pressure screening. Blood pressure changes and medicines to control blood pressure can make you feel dizzy. Depression screening. You may be more likely to have a fall if you have a fear of falling, feel depressed, or feel unable to do activities that you used to do. Alcohol use screening. Using too much alcohol can affect your balance and may make you more likely to have a fall. Follow these instructions at home: Lifestyle Do not drink alcohol if: Your health care provider tells you not to drink. If you drink alcohol: Limit how much you have to: 0-1 drink a day for women. 0-2 drinks a day for men. Know how much alcohol is in your drink. In the [place] one drink equals one 12 oz bottle of beer (355 mL), one 5 oz glass of wine (148 mL), or one 1 oz glass of hard liquor (44 mL). Do not use any products that contain nicotine or tobacco. These products include cigarettes, chewing tobacco, and vaping devices, such as e-cigarettes. If you need help quitting, ask your health care provider. Activity  Follow a regular exercise program to stay fit. This will help  you maintain your balance. Ask your health care provider what types of exercise are appropriate for you. If you need a cane or walker, use it as recommended by your health care provider. Wear supportive shoes that have nonskid soles. Safety  Remove any tripping hazards, such as rugs, cords, and clutter. Install safety equipment such as grab bars in bathrooms and safety rails on stairs. Keep rooms and walkways  well-lit. General instructions Talk with your health care provider about your risks for falling. Tell your health care provider if: You fall. Be sure to tell your health care provider about all falls, even ones that seem minor. You feel dizzy, tiredness (fatigue), or off-balance. Take over-the-counter and prescription medicines only as told by your health care provider. These include supplements. Eat a healthy diet and maintain a healthy weight. A healthy diet includes low-fat dairy products, low-fat (lean) meats, and fiber from whole grains, beans, and lots of fruits and vegetables. Stay current with your vaccines. Schedule regular health, dental, and eye exams. Summary Having a healthy lifestyle and getting preventive care can help to protect your health and wellness after age 32. Screening and testing are the best way to find a health problem early and help you avoid having a fall. Early diagnosis and treatment give you the best chance for managing medical conditions that are more common for people who are older than age 17. Falls are a major cause of broken bones and head injuries in people who are older than age 30. Take precautions to prevent a fall at home. Work with your health care provider to learn what changes you can make to improve your health and wellness and to prevent falls. This information is not intended to replace advice given to you by your health care provider. Make sure you discuss any questions you have with your health care provider. Document Revised: 02/28/2021 Document Reviewed: 02/28/2021 Elsevier Patient Education  [place].

## 2022-06-07 DIAGNOSIS — K439 Ventral hernia without obstruction or gangrene: Secondary | ICD-10-CM | POA: Insufficient documentation

## 2022-06-07 NOTE — Assessment & Plan Note (Signed)
DHEA-S normal Aldosterone levels low  Not consistent with hyperaldosteronism.

## 2022-06-07 NOTE — Assessment & Plan Note (Signed)
With marked R sided carotid bruit  - update carotid US.

## 2022-06-07 NOTE — Assessment & Plan Note (Signed)
GFR 50. Continue good hydration status.

## 2022-06-07 NOTE — Assessment & Plan Note (Signed)
Managed with pepcid '40mg'$  BID.  PPI may have caused headache.

## 2022-06-07 NOTE — Assessment & Plan Note (Signed)
Chronic, continues atenolol 25/12.'5mg'$  daily.

## 2022-06-07 NOTE — Assessment & Plan Note (Signed)
To R of umbilicus, will continue to monitor.

## 2022-07-04 ENCOUNTER — Other Ambulatory Visit: Payer: Self-pay | Admitting: Family Medicine

## 2022-07-07 ENCOUNTER — Ambulatory Visit: Payer: HMO | Attending: Family Medicine

## 2022-07-07 DIAGNOSIS — I6523 Occlusion and stenosis of bilateral carotid arteries: Secondary | ICD-10-CM | POA: Diagnosis not present

## 2022-07-07 DIAGNOSIS — I771 Stricture of artery: Secondary | ICD-10-CM | POA: Diagnosis not present

## 2022-07-11 ENCOUNTER — Telehealth: Payer: Self-pay

## 2022-07-11 NOTE — Chronic Care Management (AMB) (Signed)
Chronic Care Management Pharmacy Assistant   Name: Evan Avery  MRN: [id number] DOB: December 03, 1945  Reason for Encounter:Diabetes Disease State   Recent office visits:  06/06/22-Evan Gutierrez,MD(PCP)-[facility],(discussed labs)Sugar was higher re-start glimepiride '2mg'$  daily,referral for carotid US,f/u 4 months  Recent consult visits:  03/02/22- [facility]-labs,xrays,DM foot exam,Start: Gabapentin/Neurontin  100 mg 3 times daily-[facility]. F/u 1 year 02/22/22-[facility]- eye exam-no medication changes  Hospital visits:  None in previous 6 months  Medications: Outpatient Encounter Medications as of 07/11/2022  Medication Sig Note   atenolol (TENORMIN) 25 MG tablet Take 1 tablet (25 mg total) by mouth every morning AND 0.5 tablets (12.5 mg total) every evening.    atorvastatin (LIPITOR) 40 MG tablet TAKE 1 TABLET BY MOUTH ONCE DAILY AT  6PM    Brinzolamide-Brimonidine 1-0.2 % SUSP Apply 1 drop to eye 3 (three) times daily.     cetirizine (ZYRTEC) 10 MG tablet Take 10 mg by mouth daily as needed for allergies.    clopidogrel (PLAVIX) 75 MG tablet Take 1 tablet by mouth once daily    Coenzyme Q10 (COQ10) 200 MG CAPS Take 200 mg by mouth daily.    empagliflozin (JARDIANCE) 10 MG TABS tablet Take 1 tablet (10 mg total) by mouth daily before breakfast.    ezetimibe (ZETIA) 10 MG tablet Take 1 tablet by mouth once daily    famotidine (PEPCID) 40 MG tablet Take 1 tablet (40 mg total) by mouth 2 (two) times daily.    fluticasone (FLONASE) 50 MCG/ACT nasal spray Place 2 sprays into both nostrils daily.    gabapentin (NEURONTIN) 100 MG capsule Take 100 mg by mouth 3 (three) times daily.    glimepiride (AMARYL) 2 MG tablet Take 1 tablet (2 mg total) by mouth daily before breakfast.    isosorbide mononitrate (IMDUR) 30 MG 24 hr tablet Take 1 tablet (30 mg total) by mouth daily.    latanoprost (XALATAN) 0.005 % ophthalmic solution Place 1 drop into both eyes at bedtime.     lisinopril (ZESTRIL) 5 MG tablet Take 1 tablet by mouth once daily    metFORMIN (GLUCOPHAGE) 1000 MG tablet TAKE 1 TABLET BY MOUTH TWICE DAILY WITH MEALS    nitroGLYCERIN (NITROSTAT) 0.4 MG SL tablet Place 1 tablet (0.4 mg total) under the tongue every 5 (five) minutes as needed for chest pain. 07/16/2020: On hand   omega-3 acid ethyl esters (LOVAZA) 1 g capsule Take 2 capsules by mouth twice daily    pioglitazone (ACTOS) 30 MG tablet Take 1 tablet by mouth once daily    senna-docusate (SENOKOT-S) 8.6-50 MG tablet Take 1 tablet by mouth at bedtime as needed for mild constipation.    TURMERIC CURCUMIN PO Take by mouth daily.    No facility-administered encounter medications on file as of 07/11/2022.      Recent Relevant Labs: Lab Results  Component Value Date/Time   HGBA1C 8.8 (H) 05/30/2022 07:26 AM   HGBA1C 6.9 (A) 01/03/2022 09:57 AM   HGBA1C 8.1 (H) 01/04/2021 07:57 AM   HGBA1C 8.2 03/12/2020 12:00 AM   HGBA1C 8.4 02/12/2019 12:00 AM   MICROALBUR 7.8 (H) 05/30/2022 07:26 AM   MICROALBUR 0.2 12/27/2007 10:00 AM    Kidney Function Lab Results  Component Value Date/Time   CREATININE 1.37 05/30/2022 07:26 AM   CREATININE 1.13 01/04/2021 07:57 AM   CREATININE 0.96 03/13/2016 08:03 AM   GFR 50.00 (L) 05/30/2022 07:26 AM   GFRNONAA >60 05/15/2018 03:27 PM  GFRAA >60 05/15/2018 03:27 PM     Contacted patient on 07/14/22 to discuss diabetes disease state.   Current antihyperglycemic regimen:   Jardiance '10mg'$ -take 1 tablet before breakfast Metformin '1000mg'$ -take 1 tablet twice daily  Actos 30 mg-take 1 tablet daily   Glimepiride '2mg'$ -take 1 tablet daily     Patient verbally confirms he is taking the above medications as directed. Yes  What diet changes have been made to improve diabetes control? Patient eats smaller portions, limits added sugar intake.will eat fresh fruit and vegetables.Drinks lots of water   What recent interventions/DTPs have been made to improve glycemic  control:  Sugar was higher - start glimepiride '2mg'$  daily    Have there been any recent hospitalizations or ED visits since last visit with CPP? No  Patient denies hypoglycemic symptoms, including Pale, Sweaty, Shaky, Hungry, Nervous/irritable, and Vision changes  Patient denies hyperglycemic symptoms, including blurry vision, excessive thirst, fatigue, polyuria, and weakness  How often are you checking your blood sugar? once daily  What are your blood sugars ranging?  Fasting: 07/08/22  220 After eating a piece of cake at night          07/09/22   178          07/10/22   168 Bedtime: 07/09/22   129  During the week, how often does your blood glucose drop below 70? Never  Are you checking your feet daily/regularly? Yes  Adherence Review: Is the patient currently on a STATIN medication? Yes Is the patient currently on ACE/ARB medication? Yes Does the patient have >5 day gap between last estimated fill dates? No  Care Gaps: Annual wellness visit in last year? Yes Most recent A1C reading:8.8      05/30/22 Most Recent BP reading: 134/62     06/06/22  Last eye exam / retinopathy screening: overdue  goes to [place] Last diabetic foot exam:UTD  Counseled patient on importance of annual eye and foot exam. UTD  Summary of recommendations from last [place] visit (Date:02/21/22) Summary: CCM F/U visit -Pt has started Jardiance and stopped glimepiride -Discussed smoking cessation; pt is not ready to quit -Pt reports Lovaza generic is $90. TRIG was normal last year; A1c improved, pt may not need Rx Omega-3 products (Lovaza, Vascepa)   Recommendations/Changes made from today's visit: -No med changes -Consider discontinuing Lovaza/transition to OTC omega-3 product - pt plans to discuss with PCP at upcoming visit  Star Rating Drugs:  Medication:  Last Fill: Day Supply Atorvastatin '40mg'$  06/05/22 90 Jardiance '10mg'$  06/29/22  30 Glimepiride '2mg'$  06/06/22 90 Lisinopril '5mg'$   07/04/22 90 Actos  '30mg'$   04/21/22 90 Metformin '1000mg'$  07/04/22 90  PCP appointment on 10/06/22, CCM appointment on 08/23/22, and Cardiology appointment on 09/07/22  Evan Avery, CPP notified  [name], [place]  [phone]

## 2022-08-06 ENCOUNTER — Inpatient Hospital Stay (HOSPITAL_COMMUNITY)
Admission: EM | Admit: 2022-08-06 | Discharge: 2022-08-10 | DRG: 321 | Disposition: A | Discharge status: Home Health | Payer: No Typology Code available for payment source | Attending: Cardiology | Admitting: Cardiology

## 2022-08-06 ENCOUNTER — Inpatient Hospital Stay (HOSPITAL_COMMUNITY)
Admission: EM | Disposition: A | Discharge status: Home Health | Payer: Self-pay | Source: Home / Self Care | Attending: Cardiology

## 2022-08-06 ENCOUNTER — Inpatient Hospital Stay (HOSPITAL_COMMUNITY): Payer: No Typology Code available for payment source

## 2022-08-06 ENCOUNTER — Emergency Department (HOSPITAL_COMMUNITY): Payer: No Typology Code available for payment source

## 2022-08-06 DIAGNOSIS — I708 Atherosclerosis of other arteries: Secondary | ICD-10-CM | POA: Diagnosis present

## 2022-08-06 DIAGNOSIS — E1169 Type 2 diabetes mellitus with other specified complication: Secondary | ICD-10-CM | POA: Diagnosis present

## 2022-08-06 DIAGNOSIS — Z7189 Other specified counseling: Secondary | ICD-10-CM | POA: Diagnosis not present

## 2022-08-06 DIAGNOSIS — G9349 Other encephalopathy: Secondary | ICD-10-CM | POA: Diagnosis present

## 2022-08-06 DIAGNOSIS — Z87891 Personal history of nicotine dependence: Secondary | ICD-10-CM | POA: Diagnosis present

## 2022-08-06 DIAGNOSIS — I214 Non-ST elevation (NSTEMI) myocardial infarction: Secondary | ICD-10-CM | POA: Diagnosis present

## 2022-08-06 DIAGNOSIS — R001 Bradycardia, unspecified: Secondary | ICD-10-CM | POA: Diagnosis not present

## 2022-08-06 DIAGNOSIS — Z820 Family history of epilepsy and other diseases of the nervous system: Secondary | ICD-10-CM

## 2022-08-06 DIAGNOSIS — Z6833 Body mass index (BMI) 33.0-33.9, adult: Secondary | ICD-10-CM | POA: Diagnosis not present

## 2022-08-06 DIAGNOSIS — E875 Hyperkalemia: Secondary | ICD-10-CM | POA: Diagnosis present

## 2022-08-06 DIAGNOSIS — E874 Mixed disorder of acid-base balance: Secondary | ICD-10-CM | POA: Diagnosis present

## 2022-08-06 DIAGNOSIS — E1165 Type 2 diabetes mellitus with hyperglycemia: Secondary | ICD-10-CM | POA: Diagnosis present

## 2022-08-06 DIAGNOSIS — N179 Acute kidney failure, unspecified: Secondary | ICD-10-CM | POA: Diagnosis not present

## 2022-08-06 DIAGNOSIS — E876 Hypokalemia: Secondary | ICD-10-CM | POA: Diagnosis present

## 2022-08-06 DIAGNOSIS — I771 Stricture of artery: Secondary | ICD-10-CM | POA: Diagnosis present

## 2022-08-06 DIAGNOSIS — I25110 Atherosclerotic heart disease of native coronary artery with unstable angina pectoris: Secondary | ICD-10-CM | POA: Diagnosis not present

## 2022-08-06 DIAGNOSIS — I2111 ST elevation (STEMI) myocardial infarction involving right coronary artery: Secondary | ICD-10-CM

## 2022-08-06 DIAGNOSIS — J690 Pneumonitis due to inhalation of food and vomit: Secondary | ICD-10-CM | POA: Diagnosis not present

## 2022-08-06 DIAGNOSIS — F17210 Nicotine dependence, cigarettes, uncomplicated: Secondary | ICD-10-CM | POA: Diagnosis present

## 2022-08-06 DIAGNOSIS — R9431 Abnormal electrocardiogram [ECG] [EKG]: Secondary | ICD-10-CM | POA: Diagnosis not present

## 2022-08-06 DIAGNOSIS — Z7902 Long term (current) use of antithrombotics/antiplatelets: Secondary | ICD-10-CM

## 2022-08-06 DIAGNOSIS — Z885 Allergy status to narcotic agent status: Secondary | ICD-10-CM

## 2022-08-06 DIAGNOSIS — I4901 Ventricular fibrillation: Principal | ICD-10-CM | POA: Diagnosis present

## 2022-08-06 DIAGNOSIS — I472 Ventricular tachycardia, unspecified: Secondary | ICD-10-CM | POA: Diagnosis present

## 2022-08-06 DIAGNOSIS — R579 Shock, unspecified: Secondary | ICD-10-CM | POA: Diagnosis present

## 2022-08-06 DIAGNOSIS — Z833 Family history of diabetes mellitus: Secondary | ICD-10-CM

## 2022-08-06 DIAGNOSIS — Z955 Presence of coronary angioplasty implant and graft: Secondary | ICD-10-CM | POA: Diagnosis not present

## 2022-08-06 DIAGNOSIS — I462 Cardiac arrest due to underlying cardiac condition: Secondary | ICD-10-CM | POA: Diagnosis present

## 2022-08-06 DIAGNOSIS — I469 Cardiac arrest, cause unspecified: Secondary | ICD-10-CM | POA: Diagnosis not present

## 2022-08-06 DIAGNOSIS — E785 Hyperlipidemia, unspecified: Secondary | ICD-10-CM | POA: Diagnosis present

## 2022-08-06 DIAGNOSIS — I2510 Atherosclerotic heart disease of native coronary artery without angina pectoris: Secondary | ICD-10-CM

## 2022-08-06 DIAGNOSIS — T462X5A Adverse effect of other antidysrhythmic drugs, initial encounter: Secondary | ICD-10-CM | POA: Diagnosis not present

## 2022-08-06 DIAGNOSIS — K219 Gastro-esophageal reflux disease without esophagitis: Secondary | ICD-10-CM | POA: Diagnosis present

## 2022-08-06 DIAGNOSIS — F17200 Nicotine dependence, unspecified, uncomplicated: Secondary | ICD-10-CM | POA: Diagnosis present

## 2022-08-06 DIAGNOSIS — G4733 Obstructive sleep apnea (adult) (pediatric): Secondary | ICD-10-CM

## 2022-08-06 DIAGNOSIS — I255 Ischemic cardiomyopathy: Secondary | ICD-10-CM | POA: Diagnosis present

## 2022-08-06 DIAGNOSIS — J439 Emphysema, unspecified: Secondary | ICD-10-CM | POA: Diagnosis present

## 2022-08-06 DIAGNOSIS — I2584 Coronary atherosclerosis due to calcified coronary lesion: Secondary | ICD-10-CM | POA: Diagnosis present

## 2022-08-06 DIAGNOSIS — I700 Atherosclerosis of aorta: Secondary | ICD-10-CM | POA: Diagnosis present

## 2022-08-06 DIAGNOSIS — I739 Peripheral vascular disease, unspecified: Secondary | ICD-10-CM | POA: Diagnosis present

## 2022-08-06 DIAGNOSIS — I779 Disorder of arteries and arterioles, unspecified: Secondary | ICD-10-CM

## 2022-08-06 DIAGNOSIS — R7401 Elevation of levels of liver transaminase levels: Secondary | ICD-10-CM | POA: Diagnosis present

## 2022-08-06 DIAGNOSIS — Z7984 Long term (current) use of oral hypoglycemic drugs: Secondary | ICD-10-CM

## 2022-08-06 DIAGNOSIS — J432 Centrilobular emphysema: Secondary | ICD-10-CM

## 2022-08-06 DIAGNOSIS — Z888 Allergy status to other drugs, medicaments and biological substances status: Secondary | ICD-10-CM

## 2022-08-06 DIAGNOSIS — M1990 Unspecified osteoarthritis, unspecified site: Secondary | ICD-10-CM | POA: Diagnosis present

## 2022-08-06 DIAGNOSIS — I252 Old myocardial infarction: Secondary | ICD-10-CM

## 2022-08-06 DIAGNOSIS — Z800 Family history of malignant neoplasm of digestive organs: Secondary | ICD-10-CM

## 2022-08-06 DIAGNOSIS — J9601 Acute respiratory failure with hypoxia: Secondary | ICD-10-CM | POA: Diagnosis present

## 2022-08-06 DIAGNOSIS — Z79899 Other long term (current) drug therapy: Secondary | ICD-10-CM

## 2022-08-06 DIAGNOSIS — I119 Hypertensive heart disease without heart failure: Secondary | ICD-10-CM | POA: Diagnosis present

## 2022-08-06 DIAGNOSIS — J449 Chronic obstructive pulmonary disease, unspecified: Secondary | ICD-10-CM | POA: Diagnosis present

## 2022-08-06 DIAGNOSIS — H409 Unspecified glaucoma: Secondary | ICD-10-CM | POA: Diagnosis present

## 2022-08-06 DIAGNOSIS — Z8719 Personal history of other diseases of the digestive system: Secondary | ICD-10-CM

## 2022-08-06 DIAGNOSIS — Z8249 Family history of ischemic heart disease and other diseases of the circulatory system: Secondary | ICD-10-CM

## 2022-08-06 HISTORY — PX: CORONARY/GRAFT ACUTE MI REVASCULARIZATION: CATH118305

## 2022-08-06 HISTORY — PX: LEFT HEART CATH AND CORONARY ANGIOGRAPHY: CATH118249

## 2022-08-06 LAB — CBC WITH DIFFERENTIAL/PLATELET
Abs Immature Granulocytes: 0.32 10*3/uL — ABNORMAL HIGH (ref 0.00–0.07)
Basophils Absolute: 0.1 10*3/uL (ref 0.0–0.1)
Basophils Relative: 1 %
Eosinophils Absolute: 0.3 10*3/uL (ref 0.0–0.5)
Eosinophils Relative: 3 %
HCT: 43.5 % (ref 39.0–52.0)
Hemoglobin: 13.5 g/dL (ref 13.0–17.0)
Immature Granulocytes: 3 %
Lymphocytes Relative: 33 %
Lymphs Abs: 3.7 10*3/uL (ref 0.7–4.0)
MCH: 33.3 pg (ref 26.0–34.0)
MCHC: 31 g/dL (ref 30.0–36.0)
MCV: 107.4 fL — ABNORMAL HIGH (ref 80.0–100.0)
Monocytes Absolute: 0.6 10*3/uL (ref 0.1–1.0)
Monocytes Relative: 5 %
Neutro Abs: 6.2 10*3/uL (ref 1.7–7.7)
Neutrophils Relative %: 55 %
Platelets: 166 10*3/uL (ref 150–400)
RBC: 4.05 MIL/uL — ABNORMAL LOW (ref 4.22–5.81)
RDW: 14.4 % (ref 11.5–15.5)
WBC: 11.3 10*3/uL — ABNORMAL HIGH (ref 4.0–10.5)
nRBC: 0.3 % — ABNORMAL HIGH (ref 0.0–0.2)

## 2022-08-06 LAB — POCT I-STAT 7, (LYTES, BLD GAS, ICA,H+H)
Acid-base deficit: 9 mmol/L — ABNORMAL HIGH (ref 0.0–2.0)
Bicarbonate: 19 mmol/L — ABNORMAL LOW (ref 20.0–28.0)
Calcium, Ion: 1.17 mmol/L (ref 1.15–1.40)
HCT: 37 % — ABNORMAL LOW (ref 39.0–52.0)
Hemoglobin: 12.6 g/dL — ABNORMAL LOW (ref 13.0–17.0)
O2 Saturation: 100 %
Patient temperature: 96.1
Potassium: 6.8 mmol/L (ref 3.5–5.1)
Sodium: 137 mmol/L (ref 135–145)
TCO2: 20 mmol/L — ABNORMAL LOW (ref 22–32)
pCO2 arterial: 45.3 mmHg (ref 32–48)
pH, Arterial: 7.224 — ABNORMAL LOW (ref 7.35–7.45)
pO2, Arterial: 364 mmHg — ABNORMAL HIGH (ref 83–108)

## 2022-08-06 LAB — I-STAT CHEM 8, ED
BUN: 28 mg/dL — ABNORMAL HIGH (ref 8–23)
Calcium, Ion: 1.11 mmol/L — ABNORMAL LOW (ref 1.15–1.40)
Chloride: 110 mmol/L (ref 98–111)
Creatinine, Ser: 1.5 mg/dL — ABNORMAL HIGH (ref 0.61–1.24)
Glucose, Bld: 340 mg/dL — ABNORMAL HIGH (ref 70–99)
HCT: 38 % — ABNORMAL LOW (ref 39.0–52.0)
Hemoglobin: 12.9 g/dL — ABNORMAL LOW (ref 13.0–17.0)
Potassium: 5.1 mmol/L (ref 3.5–5.1)
Sodium: 139 mmol/L (ref 135–145)
TCO2: 18 mmol/L — ABNORMAL LOW (ref 22–32)

## 2022-08-06 LAB — COMPREHENSIVE METABOLIC PANEL
ALT: 80 U/L — ABNORMAL HIGH (ref 0–44)
ALT: 85 U/L — ABNORMAL HIGH (ref 0–44)
AST: 90 U/L — ABNORMAL HIGH (ref 15–41)
AST: 237 U/L — ABNORMAL HIGH (ref 15–41)
Albumin: 3.4 g/dL — ABNORMAL LOW (ref 3.5–5.0)
Albumin: 3.4 g/dL — ABNORMAL LOW (ref 3.5–5.0)
Alkaline Phosphatase: 56 U/L (ref 38–126)
Alkaline Phosphatase: 51 U/L (ref 38–126)
Anion gap: 14 (ref 5–15)
Anion gap: 10 (ref 5–15)
BUN: 22 mg/dL (ref 8–23)
BUN: 32 mg/dL — ABNORMAL HIGH (ref 8–23)
CO2: 15 mmol/L — ABNORMAL LOW (ref 22–32)
CO2: 16 mmol/L — ABNORMAL LOW (ref 22–32)
Calcium: 8.5 mg/dL — ABNORMAL LOW (ref 8.9–10.3)
Calcium: 7.8 mg/dL — ABNORMAL LOW (ref 8.9–10.3)
Chloride: 110 mmol/L (ref 98–111)
Chloride: 109 mmol/L (ref 98–111)
Creatinine, Ser: 1.64 mg/dL — ABNORMAL HIGH (ref 0.61–1.24)
Creatinine, Ser: 2.1 mg/dL — ABNORMAL HIGH (ref 0.61–1.24)
GFR, Estimated: 43 mL/min — ABNORMAL LOW (ref >60)
GFR, Estimated: 32 mL/min — ABNORMAL LOW (ref >60)
Glucose, Bld: 340 mg/dL — ABNORMAL HIGH (ref 70–99)
Glucose, Bld: 295 mg/dL — ABNORMAL HIGH (ref 70–99)
Potassium: 5.2 mmol/L — ABNORMAL HIGH (ref 3.5–5.1)
Potassium: 7.4 mmol/L (ref 3.5–5.1)
Sodium: 139 mmol/L (ref 135–145)
Sodium: 135 mmol/L (ref 135–145)
Total Bilirubin: 0.7 mg/dL (ref 0.3–1.2)
Total Bilirubin: 0.7 mg/dL (ref 0.3–1.2)
Total Protein: 5.8 g/dL — ABNORMAL LOW (ref 6.5–8.1)
Total Protein: 5.7 g/dL — ABNORMAL LOW (ref 6.5–8.1)

## 2022-08-06 LAB — LACTIC ACID, PLASMA
Lactic Acid, Venous: 7 mmol/L (ref 0.5–1.9)
Lactic Acid, Venous: 1.5 mmol/L (ref 0.5–1.9)

## 2022-08-06 LAB — PROTIME-INR
INR: 1.2 (ref 0.8–1.2)
Prothrombin Time: 15.2 seconds (ref 11.4–15.2)

## 2022-08-06 LAB — MAGNESIUM: Magnesium: 2.2 mg/dL (ref 1.7–2.4)

## 2022-08-06 LAB — GLUCOSE, CAPILLARY
Glucose-Capillary: 283 mg/dL — ABNORMAL HIGH (ref 70–99)
Glucose-Capillary: 281 mg/dL — ABNORMAL HIGH (ref 70–99)
Glucose-Capillary: 264 mg/dL — ABNORMAL HIGH (ref 70–99)

## 2022-08-06 LAB — TYPE AND SCREEN
ABO/RH(D): O POS
Antibody Screen: NEGATIVE

## 2022-08-06 LAB — CBC
HCT: 41.6 % (ref 39.0–52.0)
Hemoglobin: 13.4 g/dL (ref 13.0–17.0)
MCH: 32.8 pg (ref 26.0–34.0)
MCHC: 32.2 g/dL (ref 30.0–36.0)
MCV: 101.7 fL — ABNORMAL HIGH (ref 80.0–100.0)
Platelets: 153 10*3/uL (ref 150–400)
RBC: 4.09 MIL/uL — ABNORMAL LOW (ref 4.22–5.81)
RDW: 14.4 % (ref 11.5–15.5)
WBC: 16.2 10*3/uL — ABNORMAL HIGH (ref 4.0–10.5)
nRBC: 0 % (ref 0.0–0.2)

## 2022-08-06 LAB — HEPARIN LEVEL (UNFRACTIONATED): Heparin Unfractionated: <0.1 IU/mL — ABNORMAL LOW (ref 0.30–0.70)

## 2022-08-06 LAB — POCT ACTIVATED CLOTTING TIME: Activated Clotting Time: 456 seconds

## 2022-08-06 LAB — BRAIN NATRIURETIC PEPTIDE: B Natriuretic Peptide: 257.1 pg/mL — ABNORMAL HIGH (ref 0.0–100.0)

## 2022-08-06 LAB — TROPONIN I (HIGH SENSITIVITY)
Troponin I (High Sensitivity): 396 ng/L (ref <18)
Troponin I (High Sensitivity): >24000 ng/L (ref <18)

## 2022-08-06 SURGERY — CORONARY/GRAFT ACUTE MI REVASCULARIZATION
Anesthesia: LOCAL

## 2022-08-06 MED ORDER — MIDAZOLAM-SODIUM CHLORIDE 100-0.9 MG/100ML-% IV SOLN
0.5000 mg/h | INTRAVENOUS | Status: DC
  Administered 2022-08-06 – 2022-08-07 (×2): 1.5 mg/h via INTRAVENOUS
  Filled 2022-08-06: qty 100

## 2022-08-06 MED ORDER — LIDOCAINE HCL (PF) 1 % IJ SOLN
INTRAMUSCULAR | Status: AC
  Filled 2022-08-06: qty 30

## 2022-08-06 MED ORDER — SODIUM CHLORIDE 0.9 % IV SOLN: INTRAVENOUS | Status: DC

## 2022-08-06 MED ORDER — SODIUM CHLORIDE 0.9% FLUSH: 3.0000 mL | INTRAVENOUS | Status: DC | PRN

## 2022-08-06 MED ORDER — ASPIRIN 300 MG RE SUPP: 300.0000 mg | RECTAL | Status: AC

## 2022-08-06 MED ORDER — NOREPINEPHRINE 4 MG/250ML-% IV SOLN
2.0000 ug/min | INTRAVENOUS | Status: DC
  Administered 2022-08-06: 2 ug/min via INTRAVENOUS
  Filled 2022-08-06: qty 250

## 2022-08-06 MED ORDER — NOREPINEPHRINE 4 MG/250ML-% IV SOLN: 0.0000 ug/min | INTRAVENOUS | Status: DC

## 2022-08-06 MED ORDER — SUCCINYLCHOLINE CHLORIDE 20 MG/ML IJ SOLN
INTRAMUSCULAR | Status: AC | PRN
  Administered 2022-08-06: 140 mg via INTRAVENOUS

## 2022-08-06 MED ORDER — HEPARIN (PORCINE) IN NACL 1000-0.9 UT/500ML-% IV SOLN
INTRAVENOUS | Status: AC
  Filled 2022-08-06: qty 500

## 2022-08-06 MED ORDER — ASPIRIN 300 MG RE SUPP: 300.0000 mg | RECTAL | Status: DC

## 2022-08-06 MED ORDER — HEPARIN BOLUS VIA INFUSION
4000.0000 [IU] | Freq: Once | INTRAVENOUS | Status: DC
  Filled 2022-08-06: qty 4000

## 2022-08-06 MED ORDER — MIDAZOLAM HCL 2 MG/2ML IJ SOLN
1.0000 mg | INTRAMUSCULAR | Status: DC | PRN
  Administered 2022-08-06 (×2): 1 mg via INTRAVENOUS
  Filled 2022-08-06: qty 2

## 2022-08-06 MED ORDER — DOCUSATE SODIUM 50 MG/5ML PO LIQD: 100.0000 mg | Freq: Two times a day (BID) | ORAL | Status: DC | PRN

## 2022-08-06 MED ORDER — IOHEXOL 350 MG/ML SOLN
INTRAVENOUS | Status: DC | PRN
  Administered 2022-08-06: 120 mL via INTRA_ARTERIAL

## 2022-08-06 MED ORDER — AMIODARONE LOAD VIA INFUSION: 150.0000 mg | Freq: Once | INTRAVENOUS | Status: DC

## 2022-08-06 MED ORDER — FENTANYL 2500MCG IN NS 250ML (10MCG/ML) PREMIX INFUSION
25.0000 ug/h | INTRAVENOUS | Status: DC
  Administered 2022-08-06: 50 ug/h via INTRAVENOUS
  Filled 2022-08-06: qty 250

## 2022-08-06 MED ORDER — FENTANYL BOLUS VIA INFUSION
25.0000 ug | INTRAVENOUS | Status: DC | PRN
  Administered 2022-08-06: 50 ug via INTRAVENOUS
  Administered 2022-08-06: 25 ug via INTRAVENOUS

## 2022-08-06 MED ORDER — ASPIRIN 81 MG PO CHEW
81.0000 mg | CHEWABLE_TABLET | Freq: Every day | ORAL | Status: DC
  Administered 2022-08-07: 81 mg
  Filled 2022-08-06: qty 1

## 2022-08-06 MED ORDER — CLOPIDOGREL BISULFATE 75 MG PO TABS
75.0000 mg | ORAL_TABLET | Freq: Every day | ORAL | Status: DC
  Administered 2022-08-07: 75 mg
  Filled 2022-08-06: qty 1

## 2022-08-06 MED ORDER — CLOPIDOGREL BISULFATE 300 MG PO TABS
600.0000 mg | ORAL_TABLET | Freq: Once | ORAL | Status: AC
  Administered 2022-08-06: 600 mg via ORAL

## 2022-08-06 MED ORDER — MIDAZOLAM HCL 2 MG/2ML IJ SOLN
INTRAMUSCULAR | Status: AC
  Filled 2022-08-06: qty 2

## 2022-08-06 MED ORDER — AMIODARONE HCL IN DEXTROSE 360-4.14 MG/200ML-% IV SOLN
30.0000 mg/h | INTRAVENOUS | Status: DC
  Administered 2022-08-07: 30 mg/h via INTRAVENOUS
  Filled 2022-08-06 (×2): qty 200

## 2022-08-06 MED ORDER — MIDAZOLAM-SODIUM CHLORIDE 100-0.9 MG/100ML-% IV SOLN
0.5000 mg/h | INTRAVENOUS | Status: DC
  Administered 2022-08-06: 2 mg/h via INTRAVENOUS
  Filled 2022-08-06: qty 100

## 2022-08-06 MED ORDER — MIDAZOLAM BOLUS VIA INFUSION
1.0000 mg | INTRAVENOUS | Status: DC | PRN
  Administered 2022-08-06 – 2022-08-07 (×2): 1 mg via INTRAVENOUS

## 2022-08-06 MED ORDER — POLYETHYLENE GLYCOL 3350 17 G PO PACK
17.0000 g | PACK | Freq: Every day | ORAL | Status: DC
  Administered 2022-08-07: 17 g
  Filled 2022-08-06: qty 1

## 2022-08-06 MED ORDER — LABETALOL HCL 5 MG/ML IV SOLN: 10.0000 mg | INTRAVENOUS | Status: AC | PRN

## 2022-08-06 MED ORDER — PANTOPRAZOLE SODIUM 40 MG IV SOLR
40.0000 mg | Freq: Once | INTRAVENOUS | Status: AC
  Administered 2022-08-06: 40 mg via INTRAVENOUS
  Filled 2022-08-06: qty 10

## 2022-08-06 MED ORDER — LIDOCAINE HCL (PF) 1 % IJ SOLN
INTRAMUSCULAR | Status: DC | PRN
  Administered 2022-08-06: 10 mg

## 2022-08-06 MED ORDER — ACETAMINOPHEN 325 MG PO TABS: 650.0000 mg | ORAL_TABLET | ORAL | Status: DC | PRN

## 2022-08-06 MED ORDER — FENTANYL CITRATE (PF) 100 MCG/2ML IJ SOLN: 25.0000 ug | Freq: Once | INTRAMUSCULAR | Status: DC

## 2022-08-06 MED ORDER — SODIUM CHLORIDE 0.9% FLUSH
3.0000 mL | Freq: Two times a day (BID) | INTRAVENOUS | Status: DC
  Administered 2022-08-06 – 2022-08-10 (×5): 3 mL via INTRAVENOUS

## 2022-08-06 MED ORDER — DEXTROSE 50 % IV SOLN
1.0000 | Freq: Once | INTRAVENOUS | Status: AC
  Administered 2022-08-06: 50 mL via INTRAVENOUS
  Filled 2022-08-06: qty 50

## 2022-08-06 MED ORDER — ORAL CARE MOUTH RINSE
15.0000 mL | OROMUCOSAL | Status: DC
  Administered 2022-08-06 – 2022-08-07 (×12): 15 mL via OROMUCOSAL

## 2022-08-06 MED ORDER — PANTOPRAZOLE 2 MG/ML SUSPENSION
40.0000 mg | Freq: Every day | ORAL | Status: DC
  Administered 2022-08-07: 40 mg
  Filled 2022-08-06 (×2): qty 20

## 2022-08-06 MED ORDER — ATORVASTATIN CALCIUM 80 MG PO TABS
80.0000 mg | ORAL_TABLET | Freq: Every day | ORAL | Status: DC
  Administered 2022-08-07: 80 mg
  Filled 2022-08-06: qty 1

## 2022-08-06 MED ORDER — DOCUSATE SODIUM 100 MG PO CAPS: 100.0000 mg | ORAL_CAPSULE | Freq: Two times a day (BID) | ORAL | Status: DC | PRN

## 2022-08-06 MED ORDER — PANTOPRAZOLE SODIUM 40 MG IV SOLR: 40.0000 mg | Freq: Every day | INTRAVENOUS | Status: DC

## 2022-08-06 MED ORDER — CALCIUM GLUCONATE-NACL 1-0.675 GM/50ML-% IV SOLN: 1.0000 g | Freq: Once | INTRAVENOUS | Status: AC

## 2022-08-06 MED ORDER — SODIUM CHLORIDE 0.9% FLUSH: 10.0000 mL | INTRAVENOUS | Status: DC | PRN

## 2022-08-06 MED ORDER — SODIUM ZIRCONIUM CYCLOSILICATE 10 G PO PACK
10.0000 g | PACK | ORAL | Status: AC
  Administered 2022-08-06: 10 g via ORAL
  Filled 2022-08-06: qty 1

## 2022-08-06 MED ORDER — ONDANSETRON HCL 4 MG/2ML IJ SOLN: 4.0000 mg | Freq: Four times a day (QID) | INTRAMUSCULAR | Status: DC | PRN

## 2022-08-06 MED ORDER — MIDAZOLAM HCL 2 MG/2ML IJ SOLN: 1.0000 mg | INTRAMUSCULAR | Status: DC | PRN

## 2022-08-06 MED ORDER — INSULIN GLARGINE-YFGN 100 UNIT/ML ~~LOC~~ SOLN
5.0000 [IU] | Freq: Every day | SUBCUTANEOUS | Status: DC
  Administered 2022-08-06 – 2022-08-10 (×5): 5 [IU] via SUBCUTANEOUS
  Filled 2022-08-06 (×5): qty 0.05

## 2022-08-06 MED ORDER — INSULIN ASPART 100 UNIT/ML IJ SOLN: 10.0000 [IU] | Freq: Once | INTRAMUSCULAR | Status: DC

## 2022-08-06 MED ORDER — MIDAZOLAM HCL 2 MG/2ML IJ SOLN
INTRAMUSCULAR | Status: AC
  Administered 2022-08-06: 2 mg
  Filled 2022-08-06: qty 4

## 2022-08-06 MED ORDER — ETOMIDATE 2 MG/ML IV SOLN
INTRAVENOUS | Status: AC | PRN
  Administered 2022-08-06: 20 mg via INTRAVENOUS

## 2022-08-06 MED ORDER — SODIUM BICARBONATE 8.4 % IV SOLN: 50.0000 meq | Freq: Once | INTRAVENOUS | Status: DC

## 2022-08-06 MED ORDER — SODIUM CHLORIDE 0.9 % IV SOLN
INTRAVENOUS | Status: DC | PRN
  Administered 2022-08-06: .25 mg/kg/h via INTRAVENOUS

## 2022-08-06 MED ORDER — INSULIN ASPART 100 UNIT/ML IJ SOLN
0.0000 [IU] | INTRAMUSCULAR | Status: DC
  Administered 2022-08-06 (×2): 8 [IU] via SUBCUTANEOUS
  Administered 2022-08-07: 5 [IU] via SUBCUTANEOUS
  Administered 2022-08-07: 3 [IU] via SUBCUTANEOUS
  Administered 2022-08-08: 2 [IU] via SUBCUTANEOUS

## 2022-08-06 MED ORDER — DOCUSATE SODIUM 50 MG/5ML PO LIQD
100.0000 mg | Freq: Two times a day (BID) | ORAL | Status: DC
  Administered 2022-08-06 – 2022-08-07 (×2): 100 mg
  Filled 2022-08-06 (×2): qty 10

## 2022-08-06 MED ORDER — SODIUM CHLORIDE 0.9 % IV SOLN: Freq: Once | INTRAVENOUS | Status: AC

## 2022-08-06 MED ORDER — NITROGLYCERIN 1 MG/10 ML FOR IR/CATH LAB
INTRA_ARTERIAL | Status: AC
  Filled 2022-08-06: qty 10

## 2022-08-06 MED ORDER — HEPARIN (PORCINE) 25000 UT/250ML-% IV SOLN: 1300.0000 [IU]/h | INTRAVENOUS | Status: DC

## 2022-08-06 MED ORDER — MIDAZOLAM BOLUS VIA INFUSION
2.0000 mg | INTRAVENOUS | Status: DC | PRN
  Administered 2022-08-06: 2 mg via INTRAVENOUS
  Administered 2022-08-06: 1 mg via INTRAVENOUS
  Administered 2022-08-06: 2 mg via INTRAVENOUS

## 2022-08-06 MED ORDER — SODIUM BICARBONATE 8.4 % IV SOLN
25.0000 meq | Freq: Once | INTRAVENOUS | Status: AC
  Administered 2022-08-06: 25 meq via INTRAVENOUS
  Filled 2022-08-06: qty 50

## 2022-08-06 MED ORDER — ORAL CARE MOUTH RINSE: 15.0000 mL | OROMUCOSAL | Status: DC | PRN

## 2022-08-06 MED ORDER — SODIUM CHLORIDE 0.9 % IV SOLN: 250.0000 mL | INTRAVENOUS | Status: DC | PRN

## 2022-08-06 MED ORDER — POLYETHYLENE GLYCOL 3350 17 G PO PACK: 17.0000 g | PACK | Freq: Every day | ORAL | Status: DC | PRN

## 2022-08-06 MED ORDER — CALCIUM GLUCONATE-NACL 1-0.675 GM/50ML-% IV SOLN
1.0000 g | Freq: Once | INTRAVENOUS | Status: AC
  Administered 2022-08-06: 1000 mg via INTRAVENOUS
  Filled 2022-08-06: qty 50

## 2022-08-06 MED ORDER — HYDRALAZINE HCL 20 MG/ML IJ SOLN: 10.0000 mg | INTRAMUSCULAR | Status: AC | PRN

## 2022-08-06 MED ORDER — CHLORHEXIDINE GLUCONATE CLOTH 2 % EX PADS
6.0000 | MEDICATED_PAD | Freq: Every day | CUTANEOUS | Status: DC
  Administered 2022-08-07 – 2022-08-08 (×3): 6 via TOPICAL

## 2022-08-06 MED ORDER — LACTATED RINGERS IV SOLN: INTRAVENOUS | Status: DC

## 2022-08-06 MED ORDER — SODIUM CHLORIDE 0.9% FLUSH
3.0000 mL | Freq: Two times a day (BID) | INTRAVENOUS | Status: DC
  Administered 2022-08-06 – 2022-08-08 (×3): 3 mL via INTRAVENOUS

## 2022-08-06 MED ORDER — AMIODARONE HCL IN DEXTROSE 360-4.14 MG/200ML-% IV SOLN
60.0000 mg/h | INTRAVENOUS | Status: AC
  Administered 2022-08-06 (×2): 60 mg/h via INTRAVENOUS
  Filled 2022-08-06: qty 200

## 2022-08-06 MED ORDER — BIVALIRUDIN BOLUS VIA INFUSION - CUPID
INTRAVENOUS | Status: DC | PRN
  Administered 2022-08-06: 77.625 mg via INTRAVENOUS

## 2022-08-06 MED ORDER — STERILE WATER FOR INJECTION IV SOLN
INTRAVENOUS | Status: AC
  Filled 2022-08-06 (×2): qty 1000

## 2022-08-06 MED ORDER — INSULIN ASPART 100 UNIT/ML IV SOLN
10.0000 [IU] | Freq: Once | INTRAVENOUS | Status: AC
  Administered 2022-08-06: 10 [IU] via INTRAVENOUS

## 2022-08-06 MED ORDER — CLOPIDOGREL BISULFATE 75 MG PO TABS: 75.0000 mg | ORAL_TABLET | Freq: Every day | ORAL | Status: DC

## 2022-08-06 MED ORDER — CLOPIDOGREL BISULFATE 300 MG PO TABS: 300.0000 mg | ORAL_TABLET | Freq: Once | ORAL | Status: DC

## 2022-08-06 MED ORDER — SODIUM CHLORIDE 0.9 % IV SOLN: INTRAVENOUS | Status: AC

## 2022-08-06 MED ORDER — CANGRELOR BOLUS VIA INFUSION
INTRAVENOUS | Status: DC | PRN
  Administered 2022-08-06: 3105 ug via INTRAVENOUS

## 2022-08-06 MED ORDER — FENTANYL 2500MCG IN NS 250ML (10MCG/ML) PREMIX INFUSION
25.0000 ug/h | INTRAVENOUS | Status: DC
  Administered 2022-08-06: 100 ug/h via INTRAVENOUS
  Filled 2022-08-06: qty 250

## 2022-08-06 MED ORDER — MIDAZOLAM HCL 5 MG/5ML IJ SOLN
4.0000 mg | Freq: Once | INTRAMUSCULAR | Status: AC
  Administered 2022-08-06: 4 mg via INTRAVENOUS
  Filled 2022-08-06: qty 4

## 2022-08-06 MED ORDER — ASPIRIN 81 MG PO CHEW: 324.0000 mg | CHEWABLE_TABLET | ORAL | Status: DC

## 2022-08-06 MED ORDER — ASPIRIN 81 MG PO CHEW: 324.0000 mg | CHEWABLE_TABLET | Freq: Once | ORAL | Status: DC

## 2022-08-06 MED ORDER — SODIUM CHLORIDE 0.9% FLUSH
10.0000 mL | Freq: Two times a day (BID) | INTRAVENOUS | Status: DC
  Administered 2022-08-06 – 2022-08-07 (×2): 10 mL
  Administered 2022-08-07: 20 mL
  Administered 2022-08-08: 40 mL

## 2022-08-06 MED ORDER — SODIUM CHLORIDE 0.9 % IV SOLN
INTRAVENOUS | Status: AC | PRN
  Administered 2022-08-06: 4 ug/kg/min via INTRAVENOUS

## 2022-08-06 MED ORDER — ACETAMINOPHEN 160 MG/5ML PO SOLN
650.0000 mg | ORAL | Status: DC | PRN
  Administered 2022-08-06: 650 mg
  Filled 2022-08-06: qty 20.3

## 2022-08-06 MED ORDER — ASPIRIN 81 MG PO CHEW
324.0000 mg | CHEWABLE_TABLET | ORAL | Status: AC
  Administered 2022-08-06: 324 mg
  Filled 2022-08-06: qty 4

## 2022-08-06 MED ORDER — HEPARIN SODIUM (PORCINE) 5000 UNIT/ML IJ SOLN
5000.0000 [IU] | Freq: Three times a day (TID) | INTRAMUSCULAR | Status: DC
  Administered 2022-08-07 – 2022-08-10 (×11): 5000 [IU] via SUBCUTANEOUS
  Filled 2022-08-06 (×11): qty 1

## 2022-08-06 MED ORDER — FENTANYL BOLUS VIA INFUSION
25.0000 ug | INTRAVENOUS | Status: DC | PRN
  Administered 2022-08-06: 50 ug via INTRAVENOUS

## 2022-08-06 MED ORDER — CANGRELOR TETRASODIUM 50 MG IV SOLR
INTRAVENOUS | Status: AC
  Filled 2022-08-06: qty 50

## 2022-08-06 MED ORDER — SODIUM CHLORIDE 0.9 % IV SOLN
250.0000 mL | INTRAVENOUS | Status: DC
  Administered 2022-08-06: 250 mL via INTRAVENOUS

## 2022-08-06 MED ORDER — FENTANYL BOLUS VIA INFUSION
25.0000 ug | INTRAVENOUS | Status: DC | PRN
  Administered 2022-08-06 (×2): 100 ug via INTRAVENOUS
  Administered 2022-08-07: 25 ug via INTRAVENOUS
  Administered 2022-08-07: 100 ug via INTRAVENOUS

## 2022-08-06 MED ORDER — ATORVASTATIN CALCIUM 80 MG PO TABS: 80.0000 mg | ORAL_TABLET | Freq: Every day | ORAL | Status: DC

## 2022-08-06 MED ORDER — NOREPINEPHRINE 4 MG/250ML-% IV SOLN
0.0000 ug/min | INTRAVENOUS | Status: DC
  Administered 2022-08-07: 6 ug/min via INTRAVENOUS
  Filled 2022-08-06 (×2): qty 250

## 2022-08-06 SURGICAL SUPPLY — 21 items
BALL SAPPHIRE NC24 3.5X22 (BALLOONS) ×1
BALL SAPPHIRE NC24 3.75X22 (BALLOONS) ×1
BALLN SAPPHIRE 2.5X15 (BALLOONS) ×1
BALLOON SAPPHIRE 2.5X15 (BALLOONS) IMPLANT
BALLOON SAPPHIRE NC24 3.5X22 (BALLOONS) IMPLANT
BALLOON SAPPHIRE NC24 3.75X22 (BALLOONS) IMPLANT
CATH INFINITI 5FR MULTPACK ANG (CATHETERS) IMPLANT
CATH VISTA GUIDE 6FR JR4 (CATHETERS) IMPLANT
KIT ENCORE 26 ADVANTAGE (KITS) IMPLANT
KIT HEART LEFT (KITS) ×1 IMPLANT
MAT PREVALON FULL STRYKER (MISCELLANEOUS) IMPLANT
PACK CARDIAC CATHETERIZATION (CUSTOM PROCEDURE TRAY) ×1 IMPLANT
SHEATH PINNACLE 5F 10CM (SHEATH) IMPLANT
SHEATH PINNACLE 6F 10CM (SHEATH) IMPLANT
SHEATH PROBE COVER 6X72 (BAG) IMPLANT
STENT SYNERGY XD 3.50X28 (Permanent Stent) IMPLANT
SYNERGY XD 3.50X28 (Permanent Stent) ×1 IMPLANT
TRANSDUCER W/STOPCOCK (MISCELLANEOUS) ×1 IMPLANT
TUBING CIL FLEX 10 FLL-RA (TUBING) ×1 IMPLANT
WIRE ASAHI PROWATER 180CM (WIRE) IMPLANT
WIRE EMERALD 3MM-J .035X150CM (WIRE) IMPLANT

## 2022-08-06 NOTE — Procedures (Signed)
Arterial Catheter Insertion Procedure Note  Evan Avery  [id number]  1946/08/09  Date:08/06/22  Time:12:08 PM    Provider Performing: Cristal Generous    Procedure: Insertion of Arterial Line [phone]) with US guidance (32440)   Indication(s) Blood pressure monitoring and/or need for frequent ABGs  Consent Unable to obtain consent due to emergent nature of procedure.  Anesthesia Systemic fentanyl and versed    Time Out Verified patient identification, verified procedure, site/side was marked, verified correct patient position, special equipment/implants available, medications/allergies/relevant history reviewed, required imaging and test results available.   Sterile Technique Maximal sterile technique including full sterile barrier drape, hand hygiene, sterile gown, sterile gloves, mask, hair covering, sterile ultrasound probe cover (if used).   Procedure Description Asked to take over after prior unsuccessful attempts at left radial arterial line placement.  Area of catheter insertion was cleaned with chlorhexidine and draped in sterile fashion. With real-time ultrasound guidance an arterial catheter was placed into the left radial artery with one attempt.  Appropriate arterial tracings confirmed on monitor.     Complications/Tolerance Small radial hematoma from prior attempts.    EBL Minimal   Specimen(s) None   Eliseo Gum MSN, AGACNP-BC [place] for pager  08/06/2022, 12:09 PM

## 2022-08-06 NOTE — H&P (Addendum)
NAME:  Evan Avery, MRN:  [id number], DOB:  July 04, 1946, LOS: 0 ADMISSION DATE:  08/06/2022, CONSULTATION DATE:  08/06/22 REFERRING MD:  Jeanell Sparrow - EM, CHIEF COMPLAINT:  Vfib arrest    History of Present Illness:  76yo M PMH CAD, HLD, DM2, Emphysema, R subclavian artery stenosis, HTN had a witnessed OOH arrest. CPR underway upon fire arrival, DF x2 for shockable rhythm. On EMS arrival had VF and was DF an additional 4x. Also received epi x5, 2 doses amio (300, 150). Downtime probably 15 min. [place] and I/O placed. Was reportedly purposeful and agitated, received fent and versed with EMS. In ED, purposeful reaching for ETT. Has since been sedated.  EKG with anterior and lateral ST depression  Cardiology consulted and is taking to cath lab.  PCCM to admit in this setting    Pertinent  Medical History   CAD HTN HLD Emphysema OSA   Significant Hospital Events: Including procedures, antibiotic start and stop dates in addition to other pertinent events   10/15 Vfib arrest, purposeful after ROSC, goint to cath lab .  Interim History / Subjective:   Intubated in ED.  Art line placed. Cardiology has consulted   Objective   Blood pressure (!) 100/57, pulse 66, resp. rate 18, height '5\' 9"'$  (1.753 m), SpO2 100 %.    Vent Mode: PRVC  No intake or output data in the 24 hours ending 08/06/22 1055 There were no vitals filed for this visit.  Examination: General: Chronically and critically ill older adult M intubated NAD  HENT: NCAT pink mm ETT secure Lungs: CTAb mechanically ventilated  Cardiovascular: RR s1s2 cap refill < 3 Abdomen: Soft round Extremities: No acute joint deformity, no cyanosis or clubbing. L radial art line and small L radial hematoma  Neuro: Sedated, not following commands. Purposefully moving LUE  GU: defer   Resolved Hospital Problem list     Assessment & Plan:   Vfib Arrest ST depression II, III, aVF, V3-V5  CAD  HTN  HLD P -initially ordered for hep  gtt, however this has since been dc with new knowledge of hitting head during arrest -As below, CT H -Pending clear CT H, will go for emergent LHC  -if CT H clear, will order for hep gtt -antiplt per cards  -trend trops, check BNP -ECHO -admit to ICU -Avoid fever -holding home antihypertensives  -amio  Fall due to cardiac arrest -on antiplt at home -Purposeful after ROSC for EMS and EM providers. On my exam he is sedated but still moving LUE purposefully. Cannot be certain there are not any deficits, home antiplt would incr risk of bleed with his fall. I d/w EM physician and interventional cardiologist, as well as my attendings.  P -the risks of emergent cath without a CT H to r/o intracranial process outweighs the benefit given home antiplt and amount of heparin he would receive.  -STAT CT H, appreciate EM RN helping to coordinate this expeditiously  -RASS goal 0  to -1   Acute respiratory failure with hypoxia due to cardiac arrest OSA on home CPAP P -Wean MV as able  -ABG   AKI P -trend renal indices, UOP -ensure adequate renal perfusion, currently not requiring pressors  Transaminitis -likely shock liver P -trend LFTs  -will defer sending for acute hepatitis panel for now   Lactic acidosis in setting of above processes NAGMA -trend  -supportive care  -1 amp bicarb  DM2 with hyperglycemia -SSI     Best Practice (right  click and "Reselect all SmartList Selections" daily)   Diet/type: NPO DVT prophylaxis: other -- post cath, per cards  GI prophylaxis: PPI Lines: Arterial Line and yes and it is still needed Foley:  Yes, and it is still needed Code Status:  full code -- I do see in his recent PCP appointment (06/06/22) he indicated that he would not want prolonged life support and wife would be HCPOA.  Last date of multidisciplinary goals of care discussion [--]  Labs   CBC: Recent Labs  Lab 08/06/22 1034  HGB 12.9*  HCT 38.0*    Basic Metabolic  Panel: Recent Labs  Lab 08/06/22 1034  NA 139  K 5.1  CL 110  GLUCOSE 340*  BUN 28*  CREATININE 1.50*   GFR: CrCl cannot be calculated (Unknown ideal weight.). No results for input(s): "PROCALCITON", "WBC", "LATICACIDVEN" in the last 168 hours.  Liver Function Tests: No results for input(s): "AST", "ALT", "ALKPHOS", "BILITOT", "PROT", "ALBUMIN" in the last 168 hours. No results for input(s): "LIPASE", "AMYLASE" in the last 168 hours. No results for input(s): "AMMONIA" in the last 168 hours.  ABG    Component Value Date/Time   TCO2 18 (L) 08/06/2022 1034     Coagulation Profile: No results for input(s): "INR", "PROTIME" in the last 168 hours.  Cardiac Enzymes: No results for input(s): "CKTOTAL", "CKMB", "CKMBINDEX", "TROPONINI" in the last 168 hours.  HbA1C: Hemoglobin A1C  Date/Time Value Ref Range Status  01/03/2022 09:57 AM 6.9 (A) 4.0 - 5.6 % Final  03/12/2020 12:00 AM 8.2  Final  02/12/2019 12:00 AM 8.4  Final    Comment:    at [facility]   Hgb A1c MFr Bld  Date/Time Value Ref Range Status  05/30/2022 07:26 AM 8.8 (H) 4.6 - 6.5 % Final    Comment:    Glycemic Control Guidelines for People with Diabetes:Non Diabetic:  <6%Goal of Therapy: <7%Additional Action Suggested:  >8%   01/04/2021 07:57 AM 8.1 (H) 4.6 - 6.5 % Final    Comment:    Glycemic Control Guidelines for People with Diabetes:Non Diabetic:  <6%Goal of Therapy: <7%Additional Action Suggested:  >8%     CBG: No results for input(s): "GLUCAP" in the last 168 hours.  Review of Systems:   Unable to obtain, intubated sedation   Past Medical History:  He,  has a past medical history of Agent orange exposure (1968), Arthritis, CAD (coronary artery disease), Complex tear of medial meniscus of left knee as current injury (07/26/2012), Diabetes mellitus, Diverticulitis of colon with perforation (05/23/2016), DVT (deep venous thrombosis) ([place]), GERD (gastroesophageal reflux disease), Glaucoma (2006), colonic polyps,  Hyperlipidemia, Hypertension, Intra-abdominal abscess ([place]) (05/16/2016), LUMBAR RADICULOPATHY (07/18/2007), Myocardial infarction ([place]), and Sleep apnea.   Surgical History:   Past Surgical History:  Procedure Laterality Date   [place] N/A 05/23/2016   Procedure: SMALL BOWEL RESECTION;  Surgeon: Jackolyn Confer, MD;  Location: [facility];  Service: General;  Laterality: N/A;   CARDIAC CATHETERIZATION  08/11/2008   angiographically patent LAD diagonal, circumflex and multiple circumflex obtuse marginal branches from the L coronary artery; stenting of mid R coronary artery with 3.5 x 52m Liberte non DES postdilated w/ 4.0 DuraStent ([name]   CARDIOVASCULAR STRESS TEST  06/16/2011   R/P MV - moderate perfusion defect in basal inferoseptal, basal inferior, mid inferseptal and mid inferior regions, consistent with infarct/scar and/or diaphragmatic attenuation; non-gated study secondary to ectopy; no CP or EKG changes for ischemia; abnormal study although no significant  changes from previous study; in the absense of gated images cannot calculate EF or distinguish scar/artifact   COLONOSCOPY  08/2016   TAx5, sessile serrated polyp, diverticulosis, rpt 2 yrs ([place])   COLONOSCOPY  10/2020   1.5cm SSP, HP, rpt Ardis Hughs)   COLONOSCOPY  08/2020   multiple polpys removed (TA, HP), 1.5cm polyp biopsied (SSP), colon lipoma Ardis Hughs)   COLONOSCOPY WITH PROPOFOL N/A 11/11/2020   Procedure: COLONOSCOPY WITH PROPOFOL;  Surgeon: Milus Banister, MD;  [facility]: [facility];  Service: Endoscopy;  Laterality: N/A;   COLOSTOMY Left 05/23/2016   Procedure: COLOSTOMY;  Surgeon: Jackolyn Confer, MD;  Location: [facility];  Service: General;  Laterality: Left;   COLOSTOMY TAKEDOWN N/A 11/10/2016   Procedure: LAPAROSCOPIC LYSIS OF ADHESIONS AND  COLOSTOMY CLOSURE;  Surgeon: Jackolyn Confer, MD;  Location[facility];  Service: General;  Laterality: N/A;   CYST EXCISION  04/2018   posterior neck ([place])    DOPPLER ECHOCARDIOGRAPHY  08/20/2008   EF >98%; LV systolic function normal; LV mildly dilated; doppler flow suggestive of impaired LV relaxation; RV mildly dilated, RV systolic function normal, RV systolic pressure normal   ESOPHAGOGASTRODUODENOSCOPY  08/2020   gastritis, duodenitis likely NSAID/ASA related, H pylori neg - rec stop aspirin Ardis Hughs)   INCISION AND DRAINAGE ABSCESS N/A 05/23/2016   Procedure: DRAINAGE OF MULTIPLE INTRA  ABDOMINAL ABSCESS;  Surgeon: Jackolyn Confer, MD;  [facility];  Service: General;  Laterality: N/A;   KNEE SURGERY Left 2015   torn meniscus Raliegh Ip)   LAPAROSCOPIC LYSIS OF ADHESIONS  11/10/2016   Procedure: LAPAROSCOPIC LYSIS OF ADHESIONS;  Surgeon: Jackolyn Confer, MD;  [facility];  Service: General;;   LAPAROTOMY N/A 05/23/2016   Procedure: EXPLORATORY LAPAROTOMY;  Surgeon: Jackolyn Confer, MD;  [facility];  Service: General;  Laterality: N/A;   MASS EXCISION N/A 05/22/2018   Procedure: EXCISION OF CYST ON POSTERIOR NECK ERAS PATHWAY;  Surgeon: Erroll Luna, MD;  Location: [place];  Service: General;  Laterality: N/A;   PERCUTANEOUS CORONARY STENT INTERVENTION (PCI-S)  1992, 1999, 2004   4 vessels [name])   POLYPECTOMY  11/11/2020   Procedure: POLYPECTOMY;  Surgeon: Milus Banister, MD;  [facility];  Service: Endoscopy;;   PROCTOSCOPY  11/10/2016   Procedure: PROCTOSCOPY;  Surgeon: Jackolyn Confer, MD;  [facility];  Service: General;;   SUBMUCOSAL LIFTING INJECTION  11/11/2020   Procedure: SUBMUCOSAL LIFTING INJECTION;  Surgeon: Milus Banister, MD;  Location: [facility];  Service: Endoscopy;;     Social History:   reports that he has been smoking cigarettes. He started smoking about 63 years ago. He has a 50.00 pack-year smoking history. He has never used smokeless tobacco. He reports current alcohol use. He reports that he does not use drugs.   Family History:  His family history includes  Alzheimer's disease in his mother; Colon cancer in his maternal grandfather; Diabetes in his brother, paternal grandfather, and son; Heart attack in his brother; Heart disease in his brother, father, and maternal grandmother. There is no history of Esophageal cancer, Rectal cancer, or Stomach cancer.   Allergies Allergies  Allergen Reactions   Dilaudid [Hydromorphone Hcl] Itching   Ambien [Zolpidem] Other (See Comments)    Pt states "makes me wild"   Niacin And Related Other (See Comments)    Extreme fatigue     Home Medications  Prior to Admission medications   Medication Sig Start Date End Date Taking? Authorizing Provider  atenolol (TENORMIN)  25 MG tablet Take 1 tablet (25 mg total) by mouth every morning AND 0.5 tablets (12.5 mg total) every evening. 10/25/21   Troy Sine, MD  atorvastatin (LIPITOR) 40 MG tablet TAKE 1 TABLET BY MOUTH ONCE DAILY AT  [facility] 06/05/22   Troy Sine, MD  Brinzolamide-Brimonidine 1-0.2 % SUSP Apply 1 drop to eye 3 (three) times daily.     [provider]  cetirizine (ZYRTEC) 10 MG tablet Take 10 mg by mouth daily as needed for allergies.    [provider]  clopidogrel (PLAVIX) 75 MG tablet Take 1 tablet by mouth once daily 04/12/22   [name], MD  Coenzyme Q10 (COQ10) 200 MG CAPS Take 200 mg by mouth daily.    [provider]  empagliflozin (JARDIANCE) 10 MG TABS tablet Take 1 tablet (10 mg total) by mouth daily before breakfast. 01/03/22   [name], MD  ezetimibe (ZETIA) 10 MG tablet Take 1 tablet by mouth once daily 01/23/22   Troy Sine, MD  famotidine (PEPCID) 40 MG tablet Take 1 tablet (40 mg total) by mouth 2 (two) times daily. 09/03/20   Milus Banister, MD  fluticasone (FLONASE) 50 MCG/ACT nasal spray Place 2 sprays into both nostrils daily. 09/01/20   [name], MD  gabapentin (NEURONTIN) 100 MG capsule Take 100 mg by mouth 3 (three) times daily.    [provider]  glimepiride  (AMARYL) 2 MG tablet Take 1 tablet (2 mg total) by mouth daily before breakfast. 06/06/22   [name], MD  isosorbide mononitrate (IMDUR) 30 MG 24 hr tablet Take 1 tablet (30 mg total) by mouth daily. 10/25/21   Troy Sine, MD  latanoprost (XALATAN) 0.005 % ophthalmic solution Place 1 drop into both eyes at bedtime.    [provider]  lisinopril (ZESTRIL) 5 MG tablet Take 1 tablet by mouth once daily 07/04/22   [name], MD  metFORMIN (GLUCOPHAGE) 1000 MG tablet TAKE 1 TABLET BY MOUTH TWICE DAILY WITH MEALS 07/04/22   [name], MD  nitroGLYCERIN (NITROSTAT) 0.4 MG SL tablet Place 1 tablet (0.4 mg total) under the tongue every 5 (five) minutes as needed for chest pain. 05/03/20   Troy Sine, MD  omega-3 acid ethyl esters (LOVAZA) 1 g capsule Take 2 capsules by mouth twice daily 07/04/22   [name], MD  pioglitazone (ACTOS) 30 MG tablet Take 1 tablet by mouth once daily 01/23/22   [name], MD  senna-docusate (SENOKOT-S) 8.6-50 MG tablet Take 1 tablet by mouth at bedtime as needed for mild constipation. 06/02/16   Dhungel, Flonnie Overman, MD  TURMERIC CURCUMIN PO Take by mouth daily.    [provider]     Critical care time: 62 min     CRITICAL CARE Performed by: Cristal Generous   Total critical care time: 62 minutes  Critical care time was exclusive of separately billable procedures and treating other patients. Critical care was necessary to treat or prevent imminent or life-threatening deterioration.  Critical care was time spent personally by me on the following activities: development of treatment plan with patient and/or surrogate as well as nursing, discussions with consultants, evaluation of patient's response to treatment, examination of patient, obtaining history from patient or surrogate, ordering and performing treatments and interventions, ordering and review of laboratory studies, ordering and review of radiographic studies,  pulse oximetry and re-evaluation of patient's condition.  Eliseo Gum MSN, AGACNP-BC [place] for pager  08/06/2022,  12:40 PM

## 2022-08-06 NOTE — CV Procedure (Signed)
ST elevation myocardial infarction with presentation of resuscitated ventricular fibrillation, approximately 20 minutes of CPR prior to ROSC.  Lactic acid 7.  Purposeful movement after intubation.  EKG revealed persistent ST depression with aVR ST elevation. Cath demonstrated thrombotic 99% mid RCA stenosis above the previously placed stent but not involving a proximal stent.  Long segment of 50% narrowing within a heavily calcified region of the proximal to mid RCA. 28 x 3.5 Synergy positioned and deployed.  The entire stent was postdilated with a 3.75 x 22 balloon to 17 atm x 2.  There was persistent balloon deformation over a relatively long segment proximal to the culprit lesion. Stented lesion is reduced to 0%.  The long calcified segment from mid to proximal contains 50% narrowing after stenting and despite high-pressure postdilatation using a 375 balloon to 17 atm. Left coronary system is widely patent. Inferior wall is akinetic/severely hypokinetic with an EF of 30%.  EDP 17 mmHg  Patient is on Plavix daily.  Not sure if he took it today.  We will give an additional 300 mg of Plavix per orogastric tube.  Cangrelor was started in the Cath Lab.  Will discontinue cangrelor before administering clopidogrel per NG.  Bivalirudin was used in lab for antithrombotic therapy.

## 2022-08-06 NOTE — ED Notes (Signed)
Dr. Tamala Julian with cardiology notified of lactic of 7.0

## 2022-08-06 NOTE — ED Notes (Signed)
Family at bedside notified this RN that when patient suffered his cardiac arrest he fell back and struck his head rather hard per the patient's wife. Patient is on plavix. Notified CCM NP Shirlee Limerick and Dr. Tamala Julian w/ cardiology. Plan to bring patient to CT scanner before going to cath lab.

## 2022-08-06 NOTE — Progress Notes (Signed)
[place] Progress Note Patient Name: Evan Avery DOB: [birth year] MRN: [id number]   Date of Service  08/06/2022  HPI/Events of Note  Received query from RN regarding insulin orders for hyperkalemia.  CBG is at 281 and he also has sliding scale insulin 8 units SQ due now.   Pt also on peripheral levophed  and currently running at 56mg/min, amiodarone gtt.  Pt is sedated on fentanyl and propofol.   eICU Interventions  Change insulin to 10 units IV for hyperkalemia.  Ok to hold dose for SQ insulin for hyperglycemia.  Ground team informed of need for central line.      Intervention Category Intermediate Interventions: Other:  VElsie Lincoln10/15/2023, 8:07 PM

## 2022-08-06 NOTE — Progress Notes (Signed)
ANTICOAGULATION CONSULT NOTE - Initial Consult  Pharmacy Consult for heparin Indication: chest pain/ACS  Allergies  Allergen Reactions   Dilaudid [Hydromorphone Hcl] Itching   Ambien [Zolpidem] Other (See Comments)    Pt states "makes me wild"   Niacin And Related Other (See Comments)    Extreme fatigue    Patient Measurements: Height: '5\' 9"'$  (175.3 cm) IBW/kg (Calculated) : 70.7   Vital Signs: Temp: 96.1 F (35.6 C) (10/15 1130) BP: 124/69 (10/15 1130) Pulse Rate: 65 (10/15 1130)  Labs: Recent Labs    08/06/22 1030 08/06/22 1034  HGB 13.5 12.9*  HCT 43.5 38.0*  PLT 166  --   LABPROT 15.2  --   INR 1.2  --   CREATININE 1.64* 1.50*  TROPONINIHS 396*  --     CrCl cannot be calculated (Unknown ideal weight.).   Medical History: Past Medical History:  Diagnosis Date   Agent orange exposure 1968   Arthritis    CAD (coronary artery disease)    4 stents in right artery   Complex tear of medial meniscus of left knee as current injury 07/26/2012   [place]   Diabetes mellitus    type 2   Diverticulitis of colon with perforation 05/23/2016   DVT (deep venous thrombosis) (HCC)    GERD (gastroesophageal reflux disease)    Glaucoma 2006   [facility] drops, [facility]   Hx of colonic polyps    Hyperlipidemia    Hypertension    Intra-abdominal abscess ([place]) 05/16/2016   LUMBAR RADICULOPATHY 07/18/2007   Qualifier: Diagnosis of  By: Arnoldo Morale MD, John E    Myocardial infarction [facility])    1992   Sleep apnea    uses CPAP regularly   Assessment: 69 YOM presenting post-arrest (VT/VF), hx CAD, he is not on anticoagulation PTA, plans for emergent cath  Goal of Therapy:  Heparin level 0.3-0.7 units/ml Monitor platelets by anticoagulation protocol: Yes   Plan:  Heparin 4000 units IV x 1, and gtt at 1300 units/hr F/u 8 hour heparin level vs post-cath plans  Bertis Ruddy, PharmD Clinical Pharmacist ED Pharmacist Phone # [phone] 08/06/2022 12:13 PM

## 2022-08-06 NOTE — Progress Notes (Signed)
An USGPIV (ultrasound guided PIV) has been placed for short-term vasopressor infusion. A correctly placed ivWatch must be used when administering Vasopressors. Should this treatment be needed beyond 72 hours, central line access should be obtained.  It will be the responsibility of the bedside nurse to follow best practice to prevent extravasations.  HS Sherlon Nied RN 

## 2022-08-06 NOTE — Progress Notes (Signed)
Ventilator patient transported from Cath Lab 1 to 2H14 without any complications. RT standby time in cath lab approx one hour.

## 2022-08-06 NOTE — ED Provider Notes (Addendum)
[facility] EMERGENCY DEPARTMENT Provider Note   CSN: [id number] Arrival date & time: 08/06/22  1013     History  Chief Complaint  Patient presents with   CPR    Evan Avery is a 76 y.o. male.  HPI   76 year old male presents today after cardiac arrest.  Patient was at home this morning when he had a reported witnessed arrest.  Fire department was very nearby.  Their first rhythm is reported to have been V-fib.  EMS arrived and patient continued in V-fib.  He received a total of 6 defibrillations and then converted to a organized rhythm with pulses.  He received amiodarone prehospital a epi drip was available but was reported to not have been started.  Patient had [facility] airway placed prehospital.  Is reported to have prehospital blood pressure after defibrillation of a proximately 100.  Patient with prehospital pacing  Home Medications Prior to Admission medications   Medication Sig Start Date End Date Taking? Authorizing Provider  atenolol (TENORMIN) 25 MG tablet Take 1 tablet (25 mg total) by mouth every morning AND 0.5 tablets (12.5 mg total) every evening. 10/25/21   Troy Sine, MD  atorvastatin (LIPITOR) 40 MG tablet TAKE 1 TABLET BY MOUTH ONCE DAILY AT  6PM Patient taking differently: Take 40 mg by mouth daily. 06/05/22   Troy Sine, MD  Brinzolamide-Brimonidine 1-0.2 % SUSP Apply 1 drop to eye 3 (three) times daily.     [provider]  cetirizine (ZYRTEC) 10 MG tablet Take 10 mg by mouth daily as needed for allergies.    [provider]  clopidogrel (PLAVIX) 75 MG tablet Take 1 tablet by mouth once daily 04/12/22   [name], MD  Coenzyme Q10 (COQ10) 200 MG CAPS Take 200 mg by mouth daily.    [provider]  empagliflozin (JARDIANCE) 10 MG TABS tablet Take 1 tablet (10 mg total) by mouth daily before breakfast. 01/03/22   [name], MD  ezetimibe (ZETIA) 10 MG tablet Take 1 tablet by mouth once daily 01/23/22    Troy Sine, MD  famotidine (PEPCID) 40 MG tablet Take 1 tablet (40 mg total) by mouth 2 (two) times daily. 09/03/20   Milus Banister, MD  fluticasone (FLONASE) 50 MCG/ACT nasal spray Place 2 sprays into both nostrils daily. 09/01/20   [name], MD  gabapentin (NEURONTIN) 100 MG capsule Take 100 mg by mouth 3 (three) times daily.    [provider]  glimepiride (AMARYL) 2 MG tablet Take 1 tablet (2 mg total) by mouth daily before breakfast. 06/06/22   [name], MD  isosorbide mononitrate (IMDUR) 30 MG 24 hr tablet Take 1 tablet (30 mg total) by mouth daily. 10/25/21   Troy Sine, MD  latanoprost (XALATAN) 0.005 % ophthalmic solution Place 1 drop into both eyes at bedtime.    [provider]  lisinopril (ZESTRIL) 5 MG tablet Take 1 tablet by mouth once daily 07/04/22   [name], MD  metFORMIN (GLUCOPHAGE) 1000 MG tablet TAKE 1 TABLET BY MOUTH TWICE DAILY WITH MEALS Patient taking differently: Take 1,000 mg by mouth 2 (two) times daily with a meal. 07/04/22   [name], MD  nitroGLYCERIN (NITROSTAT) 0.4 MG SL tablet Place 1 tablet (0.4 mg total) under the tongue every 5 (five) minutes as needed for chest pain. 05/03/20   Troy Sine, MD  omega-3 acid ethyl esters (LOVAZA) 1 g capsule Take 2 capsules by mouth twice daily  07/04/22   [name], MD  pioglitazone (ACTOS) 30 MG tablet Take 1 tablet by mouth once daily 01/23/22   [name], MD  senna-docusate (SENOKOT-S) 8.6-50 MG tablet Take 1 tablet by mouth at bedtime as needed for mild constipation. 06/02/16   Dhungel, Flonnie Overman, MD  TURMERIC CURCUMIN PO Take 1 capsule by mouth daily.    [provider]      Allergies    Dilaudid [hydromorphone hcl], Ambien [zolpidem], and Niacin and related    Review of Systems   Review of Systems  Physical Exam Updated Vital Signs BP 124/69   Pulse 65   Temp (!) 96.1 F (35.6 C)   Resp 18   Ht 1.753 m ('5\' 9"'$ )   SpO2 100%    BMI 33.69 kg/m  Physical Exam Vitals and nursing note reviewed.  Constitutional:      General: He is in acute distress.     Appearance: He is not ill-appearing.     Comments: King airway in place With occasional gagging and movement  HENT:     Head: Normocephalic and atraumatic.     Right Ear: External ear normal.     Left Ear: External ear normal.     Nose: Nose normal.     Mouth/Throat:     Mouth: Mucous membranes are dry.  Eyes:     Comments: Pupils are small to midsize and appear to be reactive  Neck:     Comments: No trauma is noted to neck and trachea is midline Cardiovascular:     Rate and Rhythm: Normal rate. Rhythm irregular.  Pulmonary:     Comments: Patient is breathing on own through [facility] airway Abdominal:     Palpations: Abdomen is soft.  Musculoskeletal:        General: No swelling.     Comments: No obvious external signs of trauma are noted on extremities  Skin:    General: Skin is warm.  Neurological:     General: No focal deficit present.     ED Results / Procedures / Treatments   Labs (all labs ordered are listed, but only abnormal results are displayed) Labs Reviewed  CBC WITH DIFFERENTIAL/PLATELET - Abnormal; Notable for the following components:      Result Value   WBC 11.3 (*)    RBC 4.05 (*)    MCV 107.4 (*)    nRBC 0.3 (*)    Abs Immature Granulocytes 0.32 (*)    All other components within normal limits  COMPREHENSIVE METABOLIC PANEL - Abnormal; Notable for the following components:   Potassium 5.2 (*)    CO2 15 (*)    Glucose, Bld 340 (*)    Creatinine, Ser 1.64 (*)    Calcium 8.5 (*)    Total Protein 5.8 (*)    Albumin 3.4 (*)    AST 90 (*)    ALT 80 (*)    GFR, Estimated 43 (*)    All other components within normal limits  LACTIC ACID, PLASMA - Abnormal; Notable for the following components:   Lactic Acid, Venous 7.0 (*)    All other components within normal limits  I-STAT CHEM 8, ED - Abnormal; Notable for the following  components:   BUN 28 (*)    Creatinine, Ser 1.50 (*)    Glucose, Bld 340 (*)    Calcium, Ion 1.11 (*)    TCO2 18 (*)    Hemoglobin 12.9 (*)    HCT 38.0 (*)    All  other components within normal limits  TROPONIN I (HIGH SENSITIVITY) - Abnormal; Notable for the following components:   Troponin I (High Sensitivity) 396 (*)    All other components within normal limits  PROTIME-INR  MAGNESIUM  LACTIC ACID, PLASMA  AMMONIA  ETHANOL  BRAIN NATRIURETIC PEPTIDE  HEPARIN LEVEL (UNFRACTIONATED)  I-STAT ARTERIAL BLOOD GAS, ED  TYPE AND SCREEN  TROPONIN I (HIGH SENSITIVITY)    EKG EKG Interpretation  Date/Time:  Sunday August 06 2022 10:47:50 EDT Ventricular Rate:  61 PR Interval:  216 QRS Duration: 122 QT Interval:  470 QTC Calculation: 473 R Axis:   100 Text Interpretation: Sinus rhythm with 1st degree A-V block Right bundle branch block Septal infarct , age undetermined Marked ST abnormality, possible inferior subendocardial injury Abnormal ECG When compared with ECG of 11-Oct-2011 00:47, PREVIOUS ECG IS PRESENT Confirmed by Pattricia Boss [id number]) on 08/06/2022 11:47:03 AM  Radiology DG Chest Portable 1 View  Result Date: 08/06/2022 CLINICAL DATA:  Witnessed collapse.  Intubated. EXAM: PORTABLE CHEST 1 VIEW COMPARISON:  04/08/2019 FINDINGS: Endotracheal tube tip is obscured by overlying support apparatus. This is estimated to be 6-7 cm above the carina. The heart is enlarged. There may be venous hypertension but there is no frank edema at this time. IMPRESSION: Endotracheal tube tip obscured by overlying support apparatus. This is estimated to be 6-7 cm above the carina. Electronically Signed   By: Nelson Chimes M.D.   On: 08/06/2022 10:49    Procedures Procedure Name: Intubation Date/Time: 08/06/2022 11:44 AM  Performed by: Pattricia Boss, MDPre-anesthesia Checklist: Patient identified, Patient being monitored, Emergency Drugs available, Timeout performed and Suction  available Oxygen Delivery Method: Non-rebreather mask Preoxygenation: Pre-oxygenation with 100% oxygen Induction Type: Rapid sequence Ventilation: Mask ventilation without difficulty Tube size: 7.5 mm Placement Confirmation: ETT inserted through vocal cords under direct vision, CO2 detector and Breath sounds checked- equal and bilateral Secured at: 23 cm Tube secured with: ETT holder Dental Injury: Teeth and Oropharynx as per pre-operative assessment     .Critical Care  Performed by: Pattricia Boss, MD Authorized by: Pattricia Boss, MD   Critical care provider statement:    Critical care time (minutes):  61   Critical care was necessary to treat or prevent imminent or life-threatening deterioration of the following conditions:  Cardiac failure and circulatory failure   Critical care was time spent personally by me on the following activities:  Development of treatment plan with patient or surrogate, discussions with consultants, evaluation of patient's response to treatment, examination of patient, ordering and review of laboratory studies, ordering and review of radiographic studies, ordering and performing treatments and interventions, pulse oximetry, re-evaluation of patient's condition and review of old charts     Medications Ordered in ED Medications  fentaNYL 2518mg in NS 253m(1053mml) infusion-PREMIX (200 mcg/hr Intravenous Rate/Dose Change 08/06/22 1201)  fentaNYL (SUBLIMAZE) bolus via infusion 25-100 mcg (50 mcg Intravenous Bolus from Bag 08/06/22 1032)  amiodarone (NEXTERONE PREMIX) 360-4.14 MG/200ML-% (1.8 mg/mL) IV infusion (60 mg/hr Intravenous New Bag/Given 08/06/22 1040)  amiodarone (NEXTERONE PREMIX) 360-4.14 MG/200ML-% (1.8 mg/mL) IV infusion (has no administration in time range)  midazolam (VERSED) injection 1 mg (1 mg Intravenous Given 08/06/22 1208)  midazolam (VERSED) injection 1 mg (has no administration in time range)  midazolam (VERSED) 2 MG/2ML injection  (has no administration in time range)  pantoprazole (PROTONIX) 2 mg/mL oral suspension 40 mg (has no administration in time range)  pantoprazole (PROTONIX) injection 40 mg (has no administration in time range)  lactated ringers infusion (has no administration in time range)  acetaminophen (TYLENOL) tablet 650 mg (has no administration in time range)  polyethylene glycol (MIRALAX / GLYCOLAX) packet 17 g (has no administration in time range)  ondansetron (ZOFRAN) injection 4 mg (has no administration in time range)  aspirin chewable tablet 324 mg (has no administration in time range)    Or  aspirin suppository 300 mg (has no administration in time range)  docusate (COLACE) 50 MG/5ML liquid 100 mg (has no administration in time range)  midazolam (VERSED) 2 MG/2ML injection (has no administration in time range)  midazolam (VERSED) 2 MG/2ML injection (has no administration in time range)  midazolam (VERSED) 2 MG/2ML injection (2 mg  Given 08/06/22 1054)  etomidate (AMIDATE) injection (20 mg Intravenous Given 08/06/22 1026)  succinylcholine (ANECTINE) injection (140 mg Intravenous Given 08/06/22 1026)  0.9 %  sodium chloride infusion (0 mLs Intravenous Stopped 08/06/22 1202)  midazolam (VERSED) 5 MG/5ML injection 4 mg (4 mg Intravenous Given 08/06/22 1026)    ED Course/ Medical Decision Making/ A&P Clinical Course as of 08/06/22 1220  Sun Aug 06, 2022  1610 Complete metabolic panel is reviewed interpreted and significant for creatinine increased to 1.6 which is increased from baseline noted several months ago CBC reviewed interpreted significant for hemoglobin normal at 13.5, platelets 166,000  [DR]  1147 Chest x-Paislei Dorval is reviewed and interpreted and ET tube is likely high at 6.7 cm Plan advancement [DR]    Clinical Course User Index [DR] Pattricia Boss, MD                           Medical Decision Making 76 year old male known cardiac history who suffered a V-fib arrest.  Patient  resuscitated prehospital and cardioverted.  Here in the ED he has a heart rate of approximately 60.  Blood pressures have been around 100 with some dips to be due to medications. Vfib arrest, ? Stemi,  Cardiology was consulted Critical care was consulted Patient is sedated here with fentanyl and Versed Amiodarone drip is started Cardiology plans to take patient to Cath Lab Critical care is consulting at bedside RN reports that family stated he hit head when he fell and on plavix- No external trauma noted on my exam and no lateralized deficits.  Patient had prehospital tracing with v fib.Discussed with Critical care EKG was reviewed and interpreted and has diffuse ST depression no evidence of acute ST elevation Patient has remained in a normal sinus rhythm here in the ED  Amount and/or Complexity of Data Reviewed External Data Reviewed: notes. Labs: ordered. Decision-making details documented in ED Course. Radiology: ordered and independent interpretation performed. Decision-making details documented in ED Course. ECG/medicine tests: ordered and independent interpretation performed. Decision-making details documented in ED Course.  Risk Prescription drug management. Decision regarding hospitalization.           Final Clinical Impression(s) / ED Diagnoses Final diagnoses:  Cardiac arrest [facility])    Rx / DC Orders ED Discharge Orders     None         Pattricia Boss, MD 08/06/22 1220    Pattricia Boss, MD 08/06/22 1220

## 2022-08-06 NOTE — Consult Note (Addendum)
Cardiology Consultation   Patient ID: Evan Avery MRN: [id number]; DOB: July 27, 1946  Admit date: 08/06/2022 Date of Consult: 08/06/2022  PCP:  [name], [place] Providers Cardiologist:  Shelva Majestic, MD   {   Patient Profile:   Evan Avery is a 76 y.o. male with a hx of CAD, PAD, HTN, HLD, obesity and OSA who is being seen 08/06/2022 for the evaluation of cardiac arrest at the request of Dr Jeanell Sparrow.  History of Present Illness:   Evan Avery presented today from home after a witnessed cardiac arrest at home. Prior to arrival received 6 defibrillations for VF. EMS found him in VF with CPR ongoing. After shocks patient agitated and reaching for tube.  Upon my arrival, patient intubated and sedated but still agitated and reaching for ET tube. He was being externally paced upon my arrival but there was a rhythm underneath. When I discontinued external pacing, there is a rhythm in the 70s.   Past Medical History:  Diagnosis Date   Agent orange exposure 1968   Arthritis    CAD (coronary artery disease)    4 stents in right artery   Complex tear of medial meniscus of left knee as current injury 07/26/2012   [place]   Diabetes mellitus    type 2   Diverticulitis of colon with perforation 05/23/2016   DVT (deep venous thrombosis) (HCC)    GERD (gastroesophageal reflux disease)    Glaucoma 2006   [facility] drops[facility]   Hx of colonic polyps    Hyperlipidemia    Hypertension    Intra-abdominal abscess ([place]) 05/16/2016   LUMBAR RADICULOPATHY 07/18/2007   Qualifier: Diagnosis of  By: Arnoldo Morale MD, John E    Myocardial infarction [facility])    1992   Sleep apnea    uses CPAP regularly    Past Surgical History:  Procedure Laterality Date   [place] N/A 05/23/2016   Procedure: SMALL BOWEL RESECTION;  Surgeon: Jackolyn Confer, MD;  Location: [facility];  Service: General;  Laterality: N/A;   CARDIAC CATHETERIZATION  08/11/2008    angiographically patent LAD diagonal, circumflex and multiple circumflex obtuse marginal branches from the L coronary artery; stenting of mid R coronary artery with 3.5 x 60m Liberte non DES postdilated w/ 4.0 DuraStent ([name]   CARDIOVASCULAR STRESS TEST  06/16/2011   R/P MV - moderate perfusion defect in basal inferoseptal, basal inferior, mid inferseptal and mid inferior regions, consistent with infarct/scar and/or diaphragmatic attenuation; non-gated study secondary to ectopy; no CP or EKG changes for ischemia; abnormal study although no significant changes from previous study; in the absense of gated images cannot calculate EF or distinguish scar/artifact   COLONOSCOPY  08/2016   TAx5, sessile serrated polyp, diverticulosis, rpt 2 yrs ([place])   COLONOSCOPY  10/2020   1.5cm SSP, HP, rpt (Ardis Hughs   COLONOSCOPY  08/2020   multiple polpys removed (TA, HP), 1.5cm polyp biopsied (SSP), colon lipoma (Ardis Hughs   COLONOSCOPY WITH PROPOFOL N/A 11/11/2020   Procedure: COLONOSCOPY WITH PROPOFOL;  Surgeon: JMilus Banister MD;  [facility];  Service: Endoscopy;  Laterality: N/A;   COLOSTOMY Left 05/23/2016   Procedure: COLOSTOMY;  Surgeon: TJackolyn Confer MD;  Location: [facility];  Service: General;  Laterality: Left;   COLOSTOMY TAKEDOWN N/A 11/10/2016   Procedure: LAPAROSCOPIC LYSIS OF ADHESIONS AND  COLOSTOMY CLOSURE;  Surgeon: TJackolyn Confer MD;  Location[facility];  Service: General;  Laterality:  N/A;   CYST EXCISION  04/2018   posterior neck ([place])   DOPPLER ECHOCARDIOGRAPHY  08/20/2008   EF >17%; LV systolic function normal; LV mildly dilated; doppler flow suggestive of impaired LV relaxation; RV mildly dilated, RV systolic function normal, RV systolic pressure normal   ESOPHAGOGASTRODUODENOSCOPY  08/2020   gastritis, duodenitis likely NSAID/ASA related, H pylori neg - rec stop aspirin Ardis Hughs)   INCISION AND DRAINAGE ABSCESS N/A 05/23/2016   Procedure: DRAINAGE OF MULTIPLE INTRA   ABDOMINAL ABSCESS;  Surgeon: Jackolyn Confer, MD;  [facility];  Service: General;  Laterality: N/A;   KNEE SURGERY Left 2015   torn meniscus Raliegh Ip)   LAPAROSCOPIC LYSIS OF ADHESIONS  11/10/2016   Procedure: LAPAROSCOPIC LYSIS OF ADHESIONS;  Surgeon: Jackolyn Confer, MD;  [facility];  Service: General;;   LAPAROTOMY N/A 05/23/2016   Procedure: EXPLORATORY LAPAROTOMY;  Surgeon: Jackolyn Confer, MD;  [facility];  Service: General;  Laterality: N/A;   MASS EXCISION N/A 05/22/2018   Procedure: EXCISION OF CYST ON POSTERIOR NECK ERAS PATHWAY;  Surgeon: Erroll Luna, MD;  Location: [facility];  Service: General;  Laterality: N/A;   PERCUTANEOUS CORONARY STENT INTERVENTION (PCI-S)  1992, 1999, 2004   4 vessels [name])   POLYPECTOMY  11/11/2020   Procedure: POLYPECTOMY;  Surgeon: Milus Banister, MD;  [facility];  Service: Endoscopy;;   PROCTOSCOPY  11/10/2016   Procedure: PROCTOSCOPY;  Surgeon: Jackolyn Confer, MD;  [facility];  Service: General;;   SUBMUCOSAL LIFTING INJECTION  11/11/2020   Procedure: SUBMUCOSAL LIFTING INJECTION;  Surgeon: Milus Banister, MD;  Location: [facility];  Service: Endoscopy;;     Inpatient Medications: Scheduled Meds:  midazolam       Continuous Infusions:  amiodarone 60 mg/hr (08/06/22 1040)   amiodarone     fentaNYL infusion INTRAVENOUS 100 mcg/hr (08/06/22 1054)   PRN Meds: fentaNYL, midazolam, midazolam, midazolam  Allergies:    Allergies  Allergen Reactions   Dilaudid [Hydromorphone Hcl] Itching   Ambien [Zolpidem] Other (See Comments)    Pt states "makes me wild"   Niacin And Related Other (See Comments)    Extreme fatigue    Social History:   Social History   Socioeconomic History   Marital status: Married    Spouse name: Not on file   Number of children: Not on file   Years of education: Not on file   Highest education level: Not on file  Occupational History   Occupation:  retired  Tobacco Use   Smoking status: Every Day    Packs/day: 1.00    Years: 50.00    Total pack years: 50.00    Types: Cigarettes    Start date: 10/23/1958   Smokeless tobacco: Never  Vaping Use   Vaping Use: Never used  Substance and Sexual Activity   Alcohol use: Yes    Alcohol/week: 0.0 standard drinks of alcohol    Comment: rarely-1 drink every 2-3 months   Drug use: No   Sexual activity: Yes  Other Topics Concern   Not on file  Social History Narrative   Lives with wife and son (with mental issues)   Occupation: retired, was Multimedia programmer for [employer]   Activity: no regular exercise   Diet: good water, fruits/vegetables daily   Social Determinants of Health   Financial Resource Strain: Low Risk  (09/05/2021)   Overall Financial Resource Strain (CARDIA)    Difficulty of Paying Living Expenses: Not very hard  Food  Insecurity: No Food Insecurity (02/24/2022)   Hunger Vital Sign    Worried About Running Out of Food in the Last Year: Never true    Ran Out of Food in the Last Year: Never true  Transportation Needs: No Transportation Needs (02/24/2022)   PRAPARE - Hydrologist (Medical): No    Lack of Transportation (Non-Medical): No  Physical Activity: Inactive (01/02/2020)   Exercise Vital Sign    Days of Exercise per Week: 0 days    Minutes of Exercise per Session: 0 min  Stress: No Stress Concern Present (01/02/2020)   [place]    Feeling of Stress : Not at all  Social Connections: Not on file  Intimate Partner Violence: Not At Risk (01/02/2020)   Humiliation, Afraid, Rape, and Kick questionnaire    Fear of Current or Ex-Partner: No    Emotionally Abused: No    Physically Abused: No    Sexually Abused: No    Family History:    Family History  Problem Relation Age of Onset   Alzheimer's disease Mother    Heart disease Father    Heart attack Brother    Diabetes  Brother    Heart disease Brother    Heart disease Maternal Grandmother    Colon cancer Maternal Grandfather    Diabetes Paternal Grandfather    Diabetes Son    Esophageal cancer Neg Hx    Rectal cancer Neg Hx    Stomach cancer Neg Hx      ROS:  Please see the history of present illness.   All other ROS reviewed and negative.     Physical Exam/Data:   Vitals:   08/06/22 1025 08/06/22 1031 08/06/22 1039 08/06/22 1045  BP: (!) 111/54  (!) 100/57 106/60  Pulse: 95  66 61  Resp: 14  18   SpO2: 100%  100% 100%  Height:  '5\' 9"'$  (1.753 m)     No intake or output data in the 24 hours ending 08/06/22 1100    06/06/2022    9:22 AM 01/03/2022    9:27 AM 10/25/2021    9:15 AM  Last 3 Weights  Weight (lbs) 228 lb 2 oz 243 lb 2 oz 244 lb  Weight (kg) 103.477 kg 110.281 kg 110.678 kg     Body mass index is 33.69 kg/m.  General: intubated, sedated, appears younger than stated age. HEENT: intubated. Neck: atraumatic Vascular: No carotid bruits; Distal pulses 2+ bilaterally Cardiac:  normal S1, S2; irregular rhythm, warm Lungs:  intubated. Lungs sound clear. Abd: soft, nontender, no hepatomegaly  Ext: no edema Musculoskeletal:  No deformities Skin: warm and dry  Neuro:  unable to assess Psych:  sedated  Bedside echocardiogram performed by myself shows mild reduction in left ventricular function, no pericardial effusion, RV function appears normal, difficult to assess valvular function.  EKG:  The EKG was personally reviewed and demonstrates:   ST depression anterior and lateral    Telemetry:  Telemetry was personally reviewed and demonstrates:    Relevant CV Studies:  CT in 2020 for lung cancer screening reviewed and shows extensive aortic and coronary artery calcification  Laboratory Data:  High Sensitivity Troponin:  No results for input(s): "TROPONINIHS" in the last 720 hours.   Chemistry Recent Labs  Lab 08/06/22 1034  NA 139  K 5.1  CL 110  GLUCOSE 340*  BUN  28*  CREATININE 1.50*    No results for  input(s): "PROT", "ALBUMIN", "AST", "ALT", "ALKPHOS", "BILITOT" in the last 168 hours. Lipids No results for input(s): "CHOL", "TRIG", "HDL", "LABVLDL", "LDLCALC", "CHOLHDL" in the last 168 hours.  Hematology Recent Labs  Lab 08/06/22 1034  HGB 12.9*  HCT 38.0*   Thyroid No results for input(s): "TSH", "FREET4" in the last 168 hours.  BNPNo results for input(s): "BNP", "PROBNP" in the last 168 hours.  DDimer No results for input(s): "DDIMER" in the last 168 hours.   Radiology/Studies:  DG Chest Portable 1 View  Result Date: 08/06/2022 CLINICAL DATA:  Witnessed collapse.  Intubated. EXAM: PORTABLE CHEST 1 VIEW COMPARISON:  04/08/2019 FINDINGS: Endotracheal tube tip is obscured by overlying support apparatus. This is estimated to be 6-7 cm above the carina. The heart is enlarged. There may be venous hypertension but there is no frank edema at this time. IMPRESSION: Endotracheal tube tip obscured by overlying support apparatus. This is estimated to be 6-7 cm above the carina. Electronically Signed   By: Nelson Chimes M.D.   On: 08/06/2022 10:49     Assessment and Plan:   Mr Coate is a 76yo man with known CAD and PAD who presented after out of hospital cardiac arrest. He is now hemodynamically more stable. Given his history of CAD there is a good chance that his event is due to an acute coronary syndrome. He will require emergent left heart catheterization.  #Cardiac arrest #VT/VF #CAD Unclear cause.  Given his history and prior CT findings demonstrating significant coronary artery calcification, acute plaque rupture is high on the differential.  Echo Emergent left heart cath.  Discussed with Dr. Tamala Julian who is the interventional cardiologist on call. Check troponins, ABG, tox Critical care consult appreciated.     For questions or updates, please contact [facility] Please consult [email or web address] for contact info under     Signed, Vickie Epley, MD  08/06/2022 11:00 AM       CRITICAL CARE Performed by: Vickie Epley   Total critical care time: 50 minutes  Critical care time was exclusive of separately billable procedures and treating other patients.  Critical care was necessary to treat or prevent imminent or life-threatening deterioration.  Critical care was time spent personally by me on the following activities: development of treatment plan with patient and/or surrogate as well as nursing, discussions with consultants, evaluation of patient's response to treatment, examination of patient, obtaining history from patient or surrogate, ordering and performing treatments and interventions, ordering and review of laboratory studies, ordering and review of radiographic studies, pulse oximetry and re-evaluation of patient's condition.

## 2022-08-06 NOTE — ED Triage Notes (Signed)
Patient arrived from home after witnessed collapse in kitchen. Fire found patient pulseless and in V. Fib. Shocked x 2 by fire prior to EMS arrival. CPR ongoing at the home and ems reported refractory v.fib, shocked 4 additional times by EMS for a total of 6 prior to arrival. Arrived with  [facility] airway Received the following pta: Amiodarone 450 EPI x 5 External pacing intermittently pta Versed 2.5 for agitation Fentanyl 12mg Initially required Epi drip but stopped prior to arrival CBG 250 IO left tib  On arrival breathing on his own, agitated.

## 2022-08-06 NOTE — Consult Note (Addendum)
STEMI Activation Note  After reviewing the patient's clinical scenario of CPR less than 15 minutes, bystander initiation of CPR, with return of ROSC and purposeful movement after 6 defibrillatory shocks associated with CPR.  EKG does not demonstrate STEMI but there is ST segment depression noted.  The tracing performed at 1047 in this facility demonstrates ST elevation in aVR and ST depression II, III, aVF, and V3 through V5.  When compared to the January 2023 EKG, the ST segment changes are new.  Lactic acid is 7.  Patient is intubated, sedated, and paralyzed.  Risk factors for recurrent ischemic events include diabetes, hypertension, hyperlipidemia, sleep apnea.  Creatinine 1.5.  Not in shock.  No x-ray available.  IMPRESSION: Cardiac arrest with ventricular fibrillation in a patient with known prior ischemic heart disease with RCA stent placed in 2009.  Relatively short period to ROSC.  Purposeful movement after ROSC.  Does have elevated creatinine at 1.5 and lactic acid greater than 7.  Hemodynamically stable.  Plan chest x-ray and coronary angiography to define anatomy and help guide therapy.   CRITICAL CARE TIME 35 MINUTES

## 2022-08-06 NOTE — ED Notes (Addendum)
Family updated as to patient's status and at bedside  

## 2022-08-06 NOTE — Procedures (Signed)
Central Venous Catheter Insertion Procedure Note  Evan Avery  [id number]  11/17/1945  Date:08/06/22  Time:9:07 PM   Provider Performing:Kaylum Shrum D Rollene Rotunda   Procedure: Insertion of Non-tunneled Central Venous [phone]) with US guidance ([id number])   Indication(s) Medication administration  Consent Risks of the procedure as well as the alternatives and risks of each were explained to the patient and/or caregiver.  Consent for the procedure was obtained and is signed in the bedside chart  Anesthesia Topical only with 1% lidocaine   Timeout Verified patient identification, verified procedure, site/side was marked, verified correct patient position, special equipment/implants available, medications/allergies/relevant history reviewed, required imaging and test results available.  Sterile Technique Maximal sterile technique including full sterile barrier drape, hand hygiene, sterile gown, sterile gloves, mask, hair covering, sterile ultrasound probe cover (if used).  Procedure Description Area of catheter insertion was cleaned with chlorhexidine and draped in sterile fashion.  With real-time ultrasound guidance a central venous catheter was placed into the left internal jugular vein. Nonpulsatile blood flow and easy flushing noted in all ports.  The catheter was sutured in place and sterile dressing applied.  Complications/Tolerance None; patient tolerated the procedure well. Chest X-ray is ordered to verify placement for internal jugular or subclavian cannulation.   Chest x-ray is not ordered for femoral cannulation.  EBL Minimal  Specimen(s) None  Evan Avery [facility] 08/06/2022, 9:08 PM  Please see [email or web address] for pager details.  From 7A-7P if no response, please call [phone]. After hours, please call ELink [phone].

## 2022-08-07 ENCOUNTER — Inpatient Hospital Stay (HOSPITAL_COMMUNITY): Payer: No Typology Code available for payment source

## 2022-08-07 ENCOUNTER — Encounter (HOSPITAL_COMMUNITY): Payer: Self-pay | Admitting: Interventional Cardiology

## 2022-08-07 DIAGNOSIS — E1169 Type 2 diabetes mellitus with other specified complication: Secondary | ICD-10-CM

## 2022-08-07 DIAGNOSIS — E785 Hyperlipidemia, unspecified: Secondary | ICD-10-CM

## 2022-08-07 DIAGNOSIS — I469 Cardiac arrest, cause unspecified: Secondary | ICD-10-CM

## 2022-08-07 DIAGNOSIS — I255 Ischemic cardiomyopathy: Secondary | ICD-10-CM

## 2022-08-07 DIAGNOSIS — E875 Hyperkalemia: Secondary | ICD-10-CM | POA: Diagnosis present

## 2022-08-07 DIAGNOSIS — I214 Non-ST elevation (NSTEMI) myocardial infarction: Secondary | ICD-10-CM

## 2022-08-07 DIAGNOSIS — E874 Mixed disorder of acid-base balance: Secondary | ICD-10-CM | POA: Diagnosis present

## 2022-08-07 DIAGNOSIS — Z955 Presence of coronary angioplasty implant and graft: Secondary | ICD-10-CM

## 2022-08-07 LAB — POCT ACTIVATED CLOTTING TIME: Activated Clotting Time: 107 seconds

## 2022-08-07 LAB — COMPREHENSIVE METABOLIC PANEL
ALT: 126 U/L — ABNORMAL HIGH (ref 0–44)
ALT: 134 U/L — ABNORMAL HIGH (ref 0–44)
AST: 248 U/L — ABNORMAL HIGH (ref 15–41)
AST: 204 U/L — ABNORMAL HIGH (ref 15–41)
Albumin: 2.9 g/dL — ABNORMAL LOW (ref 3.5–5.0)
Albumin: 3.1 g/dL — ABNORMAL LOW (ref 3.5–5.0)
Alkaline Phosphatase: 43 U/L (ref 38–126)
Alkaline Phosphatase: 42 U/L (ref 38–126)
Anion gap: 11 (ref 5–15)
Anion gap: 10 (ref 5–15)
BUN: 36 mg/dL — ABNORMAL HIGH (ref 8–23)
BUN: 35 mg/dL — ABNORMAL HIGH (ref 8–23)
CO2: 19 mmol/L — ABNORMAL LOW (ref 22–32)
CO2: 23 mmol/L (ref 22–32)
Calcium: 8.6 mg/dL — ABNORMAL LOW (ref 8.9–10.3)
Calcium: 8.9 mg/dL (ref 8.9–10.3)
Chloride: 108 mmol/L (ref 98–111)
Chloride: 107 mmol/L (ref 98–111)
Creatinine, Ser: 2.21 mg/dL — ABNORMAL HIGH (ref 0.61–1.24)
Creatinine, Ser: 2.23 mg/dL — ABNORMAL HIGH (ref 0.61–1.24)
GFR, Estimated: 30 mL/min — ABNORMAL LOW (ref >60)
GFR, Estimated: 30 mL/min — ABNORMAL LOW (ref >60)
Glucose, Bld: 182 mg/dL — ABNORMAL HIGH (ref 70–99)
Glucose, Bld: 125 mg/dL — ABNORMAL HIGH (ref 70–99)
Potassium: 4.5 mmol/L (ref 3.5–5.1)
Potassium: 3.8 mmol/L (ref 3.5–5.1)
Sodium: 138 mmol/L (ref 135–145)
Sodium: 140 mmol/L (ref 135–145)
Total Bilirubin: 0.9 mg/dL (ref 0.3–1.2)
Total Bilirubin: 0.8 mg/dL (ref 0.3–1.2)
Total Protein: 5.1 g/dL — ABNORMAL LOW (ref 6.5–8.1)
Total Protein: 5.5 g/dL — ABNORMAL LOW (ref 6.5–8.1)

## 2022-08-07 LAB — BASIC METABOLIC PANEL
Anion gap: 10 (ref 5–15)
BUN: 35 mg/dL — ABNORMAL HIGH (ref 8–23)
CO2: 18 mmol/L — ABNORMAL LOW (ref 22–32)
Calcium: 8.3 mg/dL — ABNORMAL LOW (ref 8.9–10.3)
Chloride: 107 mmol/L (ref 98–111)
Creatinine, Ser: 2.28 mg/dL — ABNORMAL HIGH (ref 0.61–1.24)
GFR, Estimated: 29 mL/min — ABNORMAL LOW (ref >60)
Glucose, Bld: 284 mg/dL — ABNORMAL HIGH (ref 70–99)
Potassium: 5.6 mmol/L — ABNORMAL HIGH (ref 3.5–5.1)
Sodium: 135 mmol/L (ref 135–145)

## 2022-08-07 LAB — ECHOCARDIOGRAM COMPLETE
Area-P 1/2: 2.21 cm2
Calc EF: 45.1 %
Height: 69 in
MV M vel: 3.6 m/s
MV Peak grad: 51.8 mmHg
S' Lateral: 4.8 cm
Single Plane A2C EF: 47.1 %
Single Plane A4C EF: 48.9 %
Weight: 3664 oz

## 2022-08-07 LAB — POCT I-STAT 7, (LYTES, BLD GAS, ICA,H+H)
Acid-base deficit: 7 mmol/L — ABNORMAL HIGH (ref 0.0–2.0)
Bicarbonate: 17.9 mmol/L — ABNORMAL LOW (ref 20.0–28.0)
Calcium, Ion: 1.21 mmol/L (ref 1.15–1.40)
HCT: 36 % — ABNORMAL LOW (ref 39.0–52.0)
Hemoglobin: 12.2 g/dL — ABNORMAL LOW (ref 13.0–17.0)
O2 Saturation: 97 %
Patient temperature: 37.5
Potassium: 4.6 mmol/L (ref 3.5–5.1)
Sodium: 138 mmol/L (ref 135–145)
TCO2: 19 mmol/L — ABNORMAL LOW (ref 22–32)
pCO2 arterial: 32.7 mmHg (ref 32–48)
pH, Arterial: 7.35 (ref 7.35–7.45)
pO2, Arterial: 92 mmHg (ref 83–108)

## 2022-08-07 LAB — BRAIN NATRIURETIC PEPTIDE: B Natriuretic Peptide: 897.6 pg/mL — ABNORMAL HIGH (ref 0.0–100.0)

## 2022-08-07 LAB — URINALYSIS, ROUTINE W REFLEX MICROSCOPIC
Bilirubin Urine: NEGATIVE
Glucose, UA: >=500 mg/dL — AB
Ketones, ur: NEGATIVE mg/dL
Nitrite: NEGATIVE
Protein, ur: 100 mg/dL — AB
Specific Gravity, Urine: 1.033 — ABNORMAL HIGH (ref 1.005–1.030)
pH: 5 (ref 5.0–8.0)

## 2022-08-07 LAB — CBC
HCT: 38.7 % — ABNORMAL LOW (ref 39.0–52.0)
Hemoglobin: 12.6 g/dL — ABNORMAL LOW (ref 13.0–17.0)
MCH: 32.5 pg (ref 26.0–34.0)
MCHC: 32.6 g/dL (ref 30.0–36.0)
MCV: 99.7 fL (ref 80.0–100.0)
Platelets: 124 10*3/uL — ABNORMAL LOW (ref 150–400)
RBC: 3.88 MIL/uL — ABNORMAL LOW (ref 4.22–5.81)
RDW: 14.6 % (ref 11.5–15.5)
WBC: 11 10*3/uL — ABNORMAL HIGH (ref 4.0–10.5)
nRBC: 0 % (ref 0.0–0.2)

## 2022-08-07 LAB — MRSA NEXT GEN BY PCR, NASAL: MRSA by PCR Next Gen: DETECTED — AB

## 2022-08-07 LAB — GLUCOSE, CAPILLARY
Glucose-Capillary: 206 mg/dL — ABNORMAL HIGH (ref 70–99)
Glucose-Capillary: 157 mg/dL — ABNORMAL HIGH (ref 70–99)
Glucose-Capillary: 108 mg/dL — ABNORMAL HIGH (ref 70–99)
Glucose-Capillary: 109 mg/dL — ABNORMAL HIGH (ref 70–99)
Glucose-Capillary: 113 mg/dL — ABNORMAL HIGH (ref 70–99)

## 2022-08-07 MED ORDER — PIPERACILLIN-TAZOBACTAM 3.375 G IVPB
3.3750 g | Freq: Three times a day (TID) | INTRAVENOUS | Status: DC
  Administered 2022-08-07: 3.375 g via INTRAVENOUS
  Filled 2022-08-07: qty 50

## 2022-08-07 MED ORDER — CLOPIDOGREL BISULFATE 75 MG PO TABS
75.0000 mg | ORAL_TABLET | Freq: Every day | ORAL | Status: DC
  Administered 2022-08-08 – 2022-08-10 (×3): 75 mg via ORAL
  Filled 2022-08-07 (×3): qty 1

## 2022-08-07 MED ORDER — ASPIRIN 81 MG PO CHEW
81.0000 mg | CHEWABLE_TABLET | Freq: Every day | ORAL | Status: DC
  Administered 2022-08-08 – 2022-08-10 (×3): 81 mg via ORAL
  Filled 2022-08-07 (×3): qty 1

## 2022-08-07 MED ORDER — OXYCODONE HCL 5 MG PO TABS
5.0000 mg | ORAL_TABLET | Freq: Four times a day (QID) | ORAL | Status: DC | PRN
  Administered 2022-08-07: 5 mg
  Filled 2022-08-07: qty 1

## 2022-08-07 MED ORDER — LIDOCAINE 5 % EX PTCH
1.0000 | MEDICATED_PATCH | CUTANEOUS | Status: DC
  Administered 2022-08-07 – 2022-08-10 (×4): 1 via TRANSDERMAL
  Filled 2022-08-07 (×4): qty 1

## 2022-08-07 MED ORDER — PANTOPRAZOLE SODIUM 40 MG PO TBEC
40.0000 mg | DELAYED_RELEASE_TABLET | Freq: Every day | ORAL | Status: DC
  Administered 2022-08-08 – 2022-08-09 (×2): 40 mg via ORAL
  Filled 2022-08-07 (×2): qty 1

## 2022-08-07 MED ORDER — ATROPINE SULFATE 1 MG/10ML IJ SOSY
PREFILLED_SYRINGE | INTRAMUSCULAR | Status: AC
  Filled 2022-08-07: qty 10

## 2022-08-07 MED ORDER — PIPERACILLIN-TAZOBACTAM 3.375 G IVPB 30 MIN
3.3750 g | Freq: Once | INTRAVENOUS | Status: AC
  Administered 2022-08-07: 3.375 g via INTRAVENOUS
  Filled 2022-08-07: qty 50

## 2022-08-07 MED ORDER — FENTANYL CITRATE PF 50 MCG/ML IJ SOSY
50.0000 ug | PREFILLED_SYRINGE | INTRAMUSCULAR | Status: DC | PRN
  Administered 2022-08-07 – 2022-08-08 (×5): 50 ug via INTRAVENOUS
  Filled 2022-08-07 (×5): qty 1

## 2022-08-07 MED ORDER — ACETAMINOPHEN 325 MG PO TABS
650.0000 mg | ORAL_TABLET | ORAL | Status: DC | PRN
  Administered 2022-08-07 – 2022-08-10 (×5): 650 mg via ORAL
  Filled 2022-08-07 (×5): qty 2

## 2022-08-07 MED ORDER — SODIUM CHLORIDE 0.9 % IV SOLN
2.0000 g | INTRAVENOUS | Status: DC
  Administered 2022-08-07 – 2022-08-10 (×4): 2 g via INTRAVENOUS
  Filled 2022-08-07 (×4): qty 20

## 2022-08-07 MED ORDER — DOCUSATE SODIUM 100 MG PO CAPS
100.0000 mg | ORAL_CAPSULE | Freq: Two times a day (BID) | ORAL | Status: DC
  Administered 2022-08-07 – 2022-08-08 (×2): 100 mg via ORAL
  Filled 2022-08-07 (×2): qty 1

## 2022-08-07 MED ORDER — PHENOL 1.4 % MT LIQD
1.0000 | OROMUCOSAL | Status: DC | PRN
  Administered 2022-08-07 – 2022-08-08 (×2): 1 via OROMUCOSAL
  Filled 2022-08-07: qty 177

## 2022-08-07 MED ORDER — POLYETHYLENE GLYCOL 3350 17 G PO PACK: 17.0000 g | PACK | Freq: Every day | ORAL | Status: DC | PRN

## 2022-08-07 MED ORDER — ORAL CARE MOUTH RINSE: 15.0000 mL | OROMUCOSAL | Status: DC | PRN

## 2022-08-07 MED ORDER — ATORVASTATIN CALCIUM 80 MG PO TABS
80.0000 mg | ORAL_TABLET | Freq: Every day | ORAL | Status: DC
  Administered 2022-08-08 – 2022-08-10 (×3): 80 mg via ORAL
  Filled 2022-08-07 (×3): qty 1

## 2022-08-07 MED ORDER — VANCOMYCIN VARIABLE DOSE PER UNSTABLE RENAL FUNCTION (PHARMACIST DOSING): Status: DC

## 2022-08-07 MED ORDER — PERFLUTREN LIPID MICROSPHERE
1.0000 mL | INTRAVENOUS | Status: AC | PRN
  Administered 2022-08-07: 4 mL via INTRAVENOUS

## 2022-08-07 MED ORDER — MUPIROCIN 2 % EX OINT
1.0000 | TOPICAL_OINTMENT | Freq: Two times a day (BID) | CUTANEOUS | Status: DC
  Administered 2022-08-07 – 2022-08-10 (×7): 1 via NASAL
  Filled 2022-08-07 (×2): qty 22

## 2022-08-07 MED ORDER — FENTANYL CITRATE PF 50 MCG/ML IJ SOSY
50.0000 ug | PREFILLED_SYRINGE | INTRAMUSCULAR | Status: DC | PRN
  Administered 2022-08-07: 50 ug via INTRAVENOUS
  Filled 2022-08-07: qty 1

## 2022-08-07 MED ORDER — VANCOMYCIN HCL 2000 MG/400ML IV SOLN
2000.0000 mg | Freq: Once | INTRAVENOUS | Status: AC
  Administered 2022-08-07: 2000 mg via INTRAVENOUS
  Filled 2022-08-07: qty 400

## 2022-08-07 MED ORDER — POLYETHYLENE GLYCOL 3350 17 G PO PACK
17.0000 g | PACK | Freq: Every day | ORAL | Status: DC
  Administered 2022-08-08: 17 g via ORAL
  Filled 2022-08-07: qty 1

## 2022-08-07 MED ORDER — DOCUSATE SODIUM 100 MG PO CAPS: 100.0000 mg | ORAL_CAPSULE | Freq: Two times a day (BID) | ORAL | Status: DC | PRN

## 2022-08-07 MED ORDER — SODIUM ZIRCONIUM CYCLOSILICATE 10 G PO PACK
10.0000 g | PACK | ORAL | Status: AC
  Administered 2022-08-07: 10 g
  Filled 2022-08-07: qty 1

## 2022-08-07 MED ORDER — CHLORHEXIDINE GLUCONATE CLOTH 2 % EX PADS
6.0000 | MEDICATED_PAD | Freq: Every day | CUTANEOUS | Status: DC
  Administered 2022-08-07 – 2022-08-10 (×4): 6 via TOPICAL

## 2022-08-07 MED ORDER — OXYCODONE HCL 5 MG PO TABS
5.0000 mg | ORAL_TABLET | Freq: Four times a day (QID) | ORAL | Status: DC | PRN
  Administered 2022-08-08 – 2022-08-09 (×6): 10 mg via ORAL
  Filled 2022-08-07 (×6): qty 2

## 2022-08-07 NOTE — Progress Notes (Signed)
PCCM INTERVAL PROGRESS NOTE   Evaluated patient on pressure support SBT. On 5/5 for 1.5 hours. Doing great  Extubate  PRN and QHS BiPAP  WIll need to treat CPR related pain.   Lidocaine patch Oxycodone PRN   Georgann Housekeeper, AGACNP-BC [facility]  See Amion for personal pager PCCM on call pager [phone] until 7pm. Please call [place]ink 7p-7a. [phone]  08/07/2022 3:56 PM

## 2022-08-07 NOTE — Progress Notes (Signed)
Pt extubated by RRT at 1342. Pt placed on 4 lpm [place] with humidity. Breath sounds reassessed. Incentive spirometer education provided and demonstration by patient. No concerns or questions at this time.

## 2022-08-07 NOTE — Progress Notes (Signed)
100 ml of Fentanyl and 75 ml of Versed wasted with Beckie Salts, RN via [employer] in [employer].

## 2022-08-07 NOTE — Progress Notes (Signed)
Femoral artery sheath pulled at 0328 per order. Manual pressure held for 25 minutes, site is level 0, confirmed with Monna Fam, RN. Bed rest ends at 0800.

## 2022-08-07 NOTE — Progress Notes (Signed)
Pharmacy Antibiotic Note  Evan Avery is a 76 y.o. male admitted on 08/06/2022 with pneumonia.  Pharmacy has been consulted for vancomycin dosing.  Pt w/ AKI; baseline SCr ~1.4, now 2.28.  Plan: Vancomycin '2000mg'$  IV x1; monitor SCr +/- levels prior to redosing. Zosyn 4.5g Q8H ordered by CCM; will change to 3.375g IV Q8H (4-hour infusion).  Height: '5\' 9"'$  (175.3 cm) Weight: 103.9 kg (229 lb) IBW/kg (Calculated) : 70.7  Temp (24hrs), Avg:99.4 F (37.4 C), Min:95.5 F (35.3 C), Max:100.4 F (38 C)  Recent Labs  Lab 08/06/22 1030 08/06/22 1034 08/06/22 1050 08/06/22 1513 08/06/22 1730 08/06/22 2355  WBC 11.3*  --   --   --  16.2*  --   CREATININE 1.64* 1.50*  --   --  2.10* 2.28*  LATICACIDVEN  --   --  7.0* 1.5  --   --     Estimated Creatinine Clearance: 32.2 mL/min (A) (by C-G formula based on SCr of 2.28 mg/dL (H)).    Allergies  Allergen Reactions   Dilaudid [Hydromorphone Hcl] Itching   Ambien [Zolpidem] Other (See Comments)    Pt states "makes me wild"   Niacin And Related Other (See Comments)    Extreme fatigue    Thank you for allowing pharmacy to be a part of this patient's care.  Wynona Neat, PharmD, BCPS  08/07/2022 2:38 AM

## 2022-08-07 NOTE — Progress Notes (Signed)
[facility] Progress Note Patient Name: Evan Avery DOB: Oct 28, 1945 MRN: [id number]   Date of Service  08/07/2022  HPI/Events of Note  ABG 7.350/32.7/92. Crea 2.28 <-- 2.1 HCO3 18 <-- 16  BP 91/63, HR 40s, RR 20, O2 sats 99%.   Pt remains intubated.  He is on amiodarone gtt, levophed and bicarb gtt.  eICU Interventions  Continue on current vent settings.  Stop amiodarone gtt.  Continue HCO3 gtt.       Intervention Category Intermediate Interventions: Other:  Elsie Lincoln 08/07/2022, 5:42 AM

## 2022-08-07 NOTE — Procedures (Signed)
Extubation Procedure Note  Patient Details:   Name: Evan Avery DOB: 24-Dec-1945 MRN: [id number]   Airway Documentation:    Vent end date: 08/07/22 Vent end time: 1542   Evaluation  O2 sats: stable throughout Complications: No apparent complications Patient did tolerate procedure well. Bilateral Breath Sounds: Clear, Diminished   Yes  Positive cuff leak noted. Patient placed on [place] 4L with humidity, no stridor noted. Patient able to reach 500 mL using the incentive spirometer.  Bipap on standby.  Bayard Beaver 08/07/2022, 3:46 PM

## 2022-08-07 NOTE — Progress Notes (Signed)
Patient states he wears CPAP at home however his home mask differs from ours.  RT showed patient both available mask options.  Patient opted not to wear CPAP tonight.  RT encouraged patient to have someone bring home CPAP machine.  RT will continue to monitor.

## 2022-08-07 NOTE — Progress Notes (Signed)
Rounding Note    Patient Name: Evan Avery Date of Encounter: 08/07/2022  [place] Cardiologist: Shelva Majestic, MD   Patient Profile     76 y.o. male with long-standing  CAD (PCI x 4 BMS in RCA - in 1990s), PAD, HTN, HLD, Obesity, & OSA who presented on 08/06/2021 with Ischemic VF Cardiac Arrest with ROSC in setting of ACS/NSTEMI.   Witnessed Cardiac Arrest @ home -> bystander CPR + EMS 6 shocks for VF before ROSC. Intubated due to agitation/Resp Failure with Hypoxia/Hypercapnea)  Assessment & Plan    Principal Problem:   Cardiac arrest with successful resuscitation [facility]) Active Problems:   Ischemic cardiomyopathy   Ventricular fibrillation ([place]) - Ischemic - with Inferior MI - subTotal mRCA pre-stent   Acute respiratory failure with hypoxia (HCC)   AKI (acute kidney injury) ([place])   Shock ([place])   Non-ST elevation (NSTEMI) myocardial infarction ([place])   Coronary artery disease involving native coronary artery of native heart with unstable angina pectoris ([place])   ST segment depression   Presence of bare metal stent in right coronary artery - BMS x 4 to RCA in 1990s.   Presence of drug-eluting stent in right coronary artery   Type 2 diabetes mellitus with CAD   Hyperlipidemia associated with type 2 diabetes mellitus ([place]), target LDL <70   OSA on CPAP   Severe obesity (BMI 35.0-39.9) with comorbidity ([place])   Smoker   COPD (chronic obstructive pulmonary disease) (HCC)   Stenosis of right subclavian artery (HCC)   Advanced care planning/counseling discussion   Metabolic alkalosis with respiratory acidosis   Acute hyperkalemia - in setting of Combined Metabolic/Respiratory Acidosis  Principal Problem:   Cardiac arrest with successful resuscitation [facility])  Ventricular fibrillation ([place]) - Ischemic - with Inferior MI - subTotal mRCA pre-stent /  Coronary artery disease involving native coronary artery of native heart with unstable angina pectoris ([place]) - Presence of  bare metal stent in right coronary artery - BMS x 4 to RCA in 1990s./ Non-ST elevation    (NSTEMI) myocardial infarction ([place]) / ST segment depression => Presence of drug-eluting stent in right coronary artery S/p PCI RCA - on DAPT High dose Atorvastatin Awaiting recovery of BP to initate GDMT meds -  Amiodarone just d/c's due to bradycardia -- will need to monitor for rate recovery.  with Glade Stanford, would not be able to Rx with BB     Ischemic cardiomyopathy - EF 30-35% by LVGram -- Echo just performed, will await results BP still too low (just off pressors): Not yet able to initiate BB / ARB-ARNI or Spironolcatone  per GDMT    Acute on chronic respiratory failure with hypoxia ([place])- Smoker /   COPD (chronic obstructive pulmonary disease) ([place]) / OSA on CPAP Per PCCM - hoping to initiate Vent Wean Awake & following commands - On Abx for ? Asp. PNA - per PCCM    AKI (acute kidney injury) ([place]) with Metabolic alkalosis with respiratory acidosis &   Acute hyperkalemia - in setting of Combined Metabolic/Respiratory Acidosis Was on Bicarb gtt - stopped this AM = pH 7.35 - Bicarb 17.9, K 4.5  (from 5.6) & Cr down to 2.21 LVEDP was normal on Cath= >Net ++ - but is making urine - may need mild diuresis to allow vent wean (defer to PCCM)    Shock ([place]) - off of pressors / resolved Not yet able to initiate BB / ARB-ARNI or Spironolcatone  per GDMT  Type 2 diabetes mellitus with CAD /   Hyperlipidemia associated with type 2 diabetes mellitus ([place]), target LDL <70 On High dose Atorvastatin 80 mg (up from 70m @ home) SSI while inpt Pending improvement of renal fxn, consider SGLT2-I (pre-d/c)      Severe obesity (BMI 35.0-39.9) with comorbidity ([place] once stable - will need Nutrition counseling     On PPI, colace; SQ Heparin DVT Prophylaxis.   DISPO:   =================================================================== Subjective   Intubated & Sedated --> just bolused with  sedation for Echo Per RN - awake, purposeful movements & following commands.  Inpatient Medications    Scheduled Meds:  aspirin  81 mg Per Tube Daily   atorvastatin  80 mg Per Tube Daily   Chlorhexidine Gluconate Cloth  6 each Topical Daily   Chlorhexidine Gluconate Cloth  6 each Topical Q0600   clopidogrel  75 mg Per Tube Daily   docusate  100 mg Per Tube BID   fentaNYL (SUBLIMAZE) injection  25 mcg Intravenous Once   heparin  5,000 Units Subcutaneous Q8H   insulin aspart  0-15 Units Subcutaneous Q4H   insulin glargine-yfgn  5 Units Subcutaneous Daily   mupirocin ointment  1 Application Nasal BID   mouth rinse  15 mL Mouth Rinse Q2H   pantoprazole  40 mg Per Tube Daily   polyethylene glycol  17 g Per Tube Daily   sodium bicarbonate  50 mEq Intravenous Once   sodium chloride flush  10-40 mL Intracatheter Q12H   sodium chloride flush  3 mL Intravenous Q12H   sodium chloride flush  3 mL Intravenous Q12H   vancomycin variable dose per unstable renal function (pharmacist dosing)   Does not apply See admin instructions   Continuous Infusions:  sodium chloride     sodium chloride     sodium chloride     sodium chloride Stopped (08/07/22 0317)   amiodarone Stopped (08/07/22 0545)   fentaNYL infusion INTRAVENOUS 100 mcg/hr (08/07/22 0900)   lactated ringers     midazolam 1.5 mg/hr (08/07/22 0916)   norepinephrine (LEVOPHED) Adult infusion Stopped (08/07/22 0748)   piperacillin-tazobactam (ZOSYN)  IV 12.5 mL/hr at 08/07/22 0900   PRN Meds: sodium chloride, sodium chloride, acetaminophen, docusate, fentaNYL, midazolam, ondansetron (ZOFRAN) IV, mouth rinse, polyethylene glycol, sodium chloride flush, sodium chloride flush, sodium chloride flush   Vital Signs    Vitals:   08/07/22 0900 08/07/22 0915 08/07/22 0930 08/07/22 0945  BP:      Pulse: (!) 48 (!) 54 (!) 47 (!) 48  Resp: (!) 22 17 (!) 21   Temp: 99.3 F (37.4 C) 99.3 F (37.4 C) 99.1 F (37.3 C) 99.1 F (37.3 C)  SpO2:  99% 100% 99% 100%  Weight:      Height:        Intake/Output Summary (Last 24 hours) at 08/07/2022 0957 Last data filed at 08/07/2022 0900 Gross per 24 hour  Intake 3638.3 ml  Output 1590 ml  Net 2048.3 ml      08/07/2022    1:15 AM 06/06/2022    9:22 AM 01/03/2022    9:27 AM  Last 3 Weights  Weight (lbs) 229 lb 228 lb 2 oz 243 lb 2 oz  Weight (kg) 103.874 kg 103.477 kg 110.281 kg      Telemetry    SGlade Stanford40s-50s - Personally Reviewed  ECG     - Personally Reviewed - SGlade Stanford45. NS ST-T abnormalities (ST Depression no longer present).  Cardiac Studies   Cardiac Cath PCI 08/06/2022: Ischemic VF Cardiac Arrest with ROSC in setting of ACS/NSTEMI mRCA 99% (TIMI 2 flow) just above the previously placed BMS.  Left-to-right collaterals.  Heavily calcified vessel with the proximal segment segmentally 50% narrowed/calcified. PTCA-PCI with overlapping Synergy XD DES 3.5 x 28 postdilated with 3.75 mm 20 mm [place] balloon to high ATM => 99% lesion reduced to 0, but persistent underexpansion roughly 50% in the more proximal segment. Left main, LAD and LCx patent. EF estimated 30 to 35% with EDP 17%.  Severely hypokinetic to akinetic inferior wall (basal to apical)  Physical Exam   GEN: Intubated, but awake & follows commands - looks uncomfortable with multiple tubes/lines etc.   Neck: too thick to assess JVP. No bruit Cardiac: RR - [place] - distant sounds, but no obviousl M/R/G. (Difficult to hear over breath sounds).  Respiratory: coarse upper airway Breath Sounds from Vent - no rales GI: Soft, nontender, non-distended ;vnetral hernia noted.  MS: diffuse 3rd spacing swelling in upper & lower extremeties.  Neuro:  Awake & following commands.     Labs    High Sensitivity Troponin:   Recent Labs  Lab 08/06/22 1030 08/06/22 1730  TROPONINIHS 396* >24,000*     Chemistry Recent Labs  Lab 08/06/22 1030 08/06/22 1034 08/06/22 1050 08/06/22 1601 08/06/22 1730 08/06/22 2355  08/07/22 0509 08/07/22 0520  NA 139   < >  --    < > 135 135 138 138  K 5.2*   < >  --    < > 7.4* 5.6* 4.5 4.6  CL 110   < >  --   --  109 107 108  --   CO2 15*  --   --   --  16* 18* 19*  --   GLUCOSE 340*   < >  --   --  295* 284* 182*  --   BUN 22   < >  --   --  32* 35* 36*  --   CREATININE 1.64*   < >  --   --  2.10* 2.28* 2.21*  --   CALCIUM 8.5*  --   --   --  7.8* 8.3* 8.6*  --   MG  --   --  2.2  --   --   --   --   --   PROT 5.8*  --   --   --  5.7*  --  5.1*  --   ALBUMIN 3.4*  --   --   --  3.4*  --  2.9*  --   AST 90*  --   --   --  237*  --  248*  --   ALT 80*  --   --   --  85*  --  126*  --   ALKPHOS 56  --   --   --  51  --  43  --   BILITOT 0.7  --   --   --  0.7  --  0.9  --   GFRNONAA 43*  --   --   --  32* 29* 30*  --   ANIONGAP 14  --   --   --  10 10 11   --    < > = values in this interval not displayed.    Lipids No results for input(s): "CHOL", "TRIG", "HDL", "LABVLDL", "LDLCALC", "CHOLHDL" in the last 168 hours.  Hematology Recent Labs  Lab 08/06/22 1030 08/06/22 1034 08/06/22 1730 08/07/22 0509 08/07/22 0520  WBC 11.3*  --  16.2* 11.0*  --   RBC 4.05*  --  4.09* 3.88*  --   HGB 13.5   < > 13.4 12.6* 12.2*  HCT 43.5   < > 41.6 38.7* 36.0*  MCV 107.4*  --  101.7* 99.7  --   MCH 33.3  --  32.8 32.5  --   MCHC 31.0  --  32.2 32.6  --   RDW 14.4  --  14.4 14.6  --   PLT 166  --  153 124*  --    < > = values in this interval not displayed.   Thyroid No results for input(s): "TSH", "FREET4" in the last 168 hours.  BNP Recent Labs  Lab 08/06/22 1030 08/07/22 0509  BNP 257.1* 897.6*    DDimer No results for input(s): "DDIMER" in the last 168 hours.   Radiology    DG Chest Port 1 View  Result Date: 08/07/2022 CLINICAL DATA:  Ventilator dependence. EXAM: PORTABLE CHEST 1 VIEW COMPARISON:  08/06/2022 FINDINGS: 0516 hours. Endotracheal tube tip is 3.8 cm above the base of the carina. Distal course of the NG tube not well demonstrated. Left IJ  central line tip overlies the proximal SVC level. Interstitial markings are diffusely coarsened with chronic features. The cardio pericardial silhouette is enlarged. Pulmonary vascular congestion again noted. Telemetry leads overlie the chest. IMPRESSION: 1. Endotracheal tube tip is 3.8 cm above the base of the carina. 2. Distal course of the NG tube not well demonstrated. 3. Cardiomegaly with vascular congestion. Electronically Signed   By: Misty Stanley M.D.   On: 08/07/2022 07:19   DG CHEST PORT 1 VIEW  Result Date: 08/06/2022 CLINICAL DATA:  Central line EXAM: PORTABLE CHEST 1 VIEW COMPARISON:  Chest x-ray 08/06/2022 FINDINGS: New left-sided central venous catheter tip projects over the brachiocephalic SVC junction. Enteric tube extends at least to the level of the diaphragm. Endotracheal tube tip is 3 cm above the carina. The heart is enlarged, unchanged. There is central pulmonary vascular congestion. There is no pleural effusion or pneumothorax. No acute fractures are seen. IMPRESSION: 1. New left-sided central venous catheter tip projects over the brachiocephalic SVC junction. 2. Cardiomegaly with central pulmonary vascular congestion. Electronically Signed   By: Ronney Asters M.D.   On: 08/06/2022 21:21   DG Abd Portable 1V  Result Date: 08/06/2022 CLINICAL DATA:  Nasal/orogastric tube placement. EXAM: PORTABLE ABDOMEN - 1 VIEW COMPARISON:  CT, 09/29/2020. FINDINGS: Nasal/orogastric tube passes below the diaphragm, tip in the left upper quadrant consistent with positioning in the gastric fundus. Visualized lungs show vascular congestion and interstitial thickening. IMPRESSION: Well-positioned nasal/orogastric tube. Electronically Signed   By: Lajean Manes M.D.   On: 08/06/2022 15:56   CARDIAC CATHETERIZATION  Result Date: 08/06/2022 CONCLUSIONS: Cardiac arrest in the setting of ACS identified that the mid RCA above a previously placed stent is 99% stenosis with TIMI grade II flow and  recruited left-to-right collaterals distally.  The vessel is heavily calcified and the entire proximal segment is segmentally 50% narrowed. The culprit lesion was dilated and then stented with a 28 x 3.5 mm Synergy postdilated using high-pressure with a 3.75 x 22 [place] balloon to 17 atm x 2.  TIMI grade III flow established.  0% stenosis was noted at the site of the 99% stenosis but persistent 50% underexpansion was noted proximal to the culprit. Left main is widely patent LAD is  widely patent Circumflex is widely patent Inferior wall to the apex is severely hypokinetic to akinetic.  EDP is 17.  EF is 30 to 35%. RECOMMENDATIONS: Unknown if patient took Plavix this morning.  Cangrelor started in Cath Lab.  Once NG tube is in place cangrelor will be discontinued and 300 mg of clopidogrel given per NG tube. Management of ventilator and postcardiac arrest state by critical care medicine.  Intention is for patient to be on aspirin and Plavix. Sheath will be pulled according to protocol when ACT/Angiomax are at target.   CT HEAD WO CONTRAST (5MM)  Result Date: 08/06/2022 CLINICAL DATA:  Trauma EXAM: CT HEAD WITHOUT CONTRAST TECHNIQUE: Contiguous axial images were obtained from the base of the skull through the vertex without intravenous contrast. RADIATION DOSE REDUCTION: This exam was performed according to the departmental dose-optimization program which includes automated exposure control, adjustment of the mA and/or kV according to patient size and/or use of iterative reconstruction technique. COMPARISON:  None Available. FINDINGS: Brain: No acute intracranial findings are seen. There are no signs of bleeding within the cranium. Ventricles are not dilated. Cortical sulci are prominent. Vascular: Scattered arterial calcifications are seen. Skull: No fracture is seen in calvarium. Sinuses/Orbits: There is mucosal thickening in ethmoid, sphenoid and maxillary sinuses. Other: None. IMPRESSION: No acute intracranial  findings are seen in noncontrast CT brain. Atrophy. Chronic sinusitis. Electronically Signed   By: Elmer Picker M.D.   On: 08/06/2022 12:54   DG Chest Portable 1 View  Result Date: 08/06/2022 CLINICAL DATA:  Witnessed collapse.  Intubated. EXAM: PORTABLE CHEST 1 VIEW COMPARISON:  04/08/2019 FINDINGS: Endotracheal tube tip is obscured by overlying support apparatus. This is estimated to be 6-7 cm above the carina. The heart is enlarged. There may be venous hypertension but there is no frank edema at this time. IMPRESSION: Endotracheal tube tip obscured by overlying support apparatus. This is estimated to be 6-7 cm above the carina. Electronically Signed   By: Nelson Chimes M.D.   On: 08/06/2022 10:49     For questions or updates, please contact [place] Please consult [email or web address] for contact info under        Signed, Glenetta Hew, MD  08/07/2022, 9:57 AM

## 2022-08-07 NOTE — Progress Notes (Addendum)
NAME:  Evan Avery, MRN:  [id number], DOB:  [birth year], LOS: 1 ADMISSION DATE:  08/06/2022, CONSULTATION DATE:  08/06/22 REFERRING MD:  Jeanell Sparrow - EM, CHIEF COMPLAINT:  Vfib arrest    History of Present Illness:  76yo M PMH CAD, HLD, DM2, Emphysema, R subclavian artery stenosis, HTN had a witnessed OOH arrest. CPR underway upon fire arrival, DF x2 for shockable rhythm. On EMS arrival had VF and was DF an additional 4x. Also received epi x5, 2 doses amio (300, 150). Downtime probably 15 min. [place] and I/O placed. Was reportedly purposeful and agitated, received fent and versed with EMS. In ED, purposeful reaching for ETT. Has since been sedated.  EKG with anterior and lateral ST depression  Cardiology consulted and is taking to cath lab.  PCCM to admit in this setting    Pertinent  Medical History   CAD HTN HLD Emphysema OSA   Significant Hospital Events: Including procedures, antibiotic start and stop dates in addition to other pertinent events   10/15 Vfib arrest, purposeful after ROSC, goint to cath lab .    Interim History / Subjective:  Awake following commands. Off pressors. Sedation off as of 10 mins ago. Failed wean initially.   Objective   Blood pressure (!) 143/108, pulse (!) 55, temperature 99.1 F (37.3 C), resp. rate 16, height '5\' 9"'$  (1.753 m), weight 103.9 kg, SpO2 100 %.    Vent Mode: PRVC FiO2 (%):  [40 %] 40 % Set Rate:  [18 bmp-22 bmp] 22 bmp Vt Set:  [560 mL] 560 mL PEEP:  [5 cmH20] 5 cmH20 Plateau Pressure:  [16 cmH20-17 cmH20] 16 cmH20   Intake/Output Summary (Last 24 hours) at 08/07/2022 1044 Last data filed at 08/07/2022 1042 Gross per 24 hour  Intake 3641.3 ml  Output 1590 ml  Net 2051.3 ml   Filed Weights   08/07/22 0115  Weight: 103.9 kg    Examination: General: Elderly appearing male in NAD HENT: NCAT, ETT Lungs: Clear bilateral breath sounds Cardiovascular: RRR, no mRG Abdomen: Soft round Extremities: No acute deformity Neuro:  Sedated, not following commands. Purposefully moving LUE  GU: defer   Resolved Hospital Problem list     Assessment & Plan:   Vfib Arrest RCA occlusion 99% now s/p DES 10/16 Ischemic cardiomyopathy, LVEF 35-40% be LV gram CAD  P -appreciate cardiology recommendations -Plavix -ECHO pending -holding GDMT for now with borderline low Bp likely due to sedation.  -amio per cardiology  Acute respiratory failure with hypoxia due to cardiac arrest OSA on home CPAP P -Allow fentanyl to clear then will re-attempt SBT -Lasix x 1 to facilitate wean.  -Hopeful for extubation today -VAP, aspiration precautions.  -QHS CPAP once extubated  AKI P -trend renal indices, UOP  Transaminitis -likely shock liver P -trend LFTs   Lactic acidosis in setting of above processes NAGMA - improving  DM2 with hyperglycemia - SSI   Fall due to cardiac arrest -on antiplt at home -Purposeful after ROSC for EMS and EM providers. On my exam he is sedated but still moving LUE purposefully. Cannot be certain there are not any deficits, home antiplt would incr risk of bleed with his fall. I d/w EM physician and interventional cardiologist, as well as my attendings.  P -CT head non-acute  Best Practice (right click and "Reselect all SmartList Selections" daily)   Diet/type: NPO DVT prophylaxis: other -- post cath, per cards  GI prophylaxis: PPI Lines: Arterial Line and yes and it is still needed  Foley:  Yes, and it is still needed Code Status:  full code -- I do see in his recent PCP appointment (06/06/22) he indicated that he would not want prolonged life support and wife would be HCPOA.  Last date of multidisciplinary goals of care discussion [--]  Labs   CBC: Recent Labs  Lab 08/06/22 1030 08/06/22 1034 08/06/22 1601 08/06/22 1730 08/07/22 0509 08/07/22 0520  WBC 11.3*  --   --  16.2* 11.0*  --   NEUTROABS 6.2  --   --   --   --   --   HGB 13.5 12.9* 12.6* 13.4 12.6* 12.2*  HCT 43.5  38.0* 37.0* 41.6 38.7* 36.0*  MCV 107.4*  --   --  101.7* 99.7  --   PLT 166  --   --  153 124*  --      Basic Metabolic Panel: Recent Labs  Lab 08/06/22 1030 08/06/22 1034 08/06/22 1050 08/06/22 1601 08/06/22 1730 08/06/22 2355 08/07/22 0509 08/07/22 0520  NA 139 139  --  137 135 135 138 138  K 5.2* 5.1  --  6.8* 7.4* 5.6* 4.5 4.6  CL 110 110  --   --  109 107 108  --   CO2 15*  --   --   --  16* 18* 19*  --   GLUCOSE 340* 340*  --   --  295* 284* 182*  --   BUN 22 28*  --   --  32* 35* 36*  --   CREATININE 1.64* 1.50*  --   --  2.10* 2.28* 2.21*  --   CALCIUM 8.5*  --   --   --  7.8* 8.3* 8.6*  --   MG  --   --  2.2  --   --   --   --   --     GFR: Estimated Creatinine Clearance: 33.3 mL/min (A) (by C-G formula based on SCr of 2.21 mg/dL (H)). Recent Labs  Lab 08/06/22 1030 08/06/22 1050 08/06/22 1513 08/06/22 1730 08/07/22 0509  WBC 11.3*  --   --  16.2* 11.0*  LATICACIDVEN  --  7.0* 1.5  --   --     Liver Function Tests: Recent Labs  Lab 08/06/22 1030 08/06/22 1730 08/07/22 0509  AST 90* 237* 248*  ALT 80* 85* 126*  ALKPHOS 56 51 43  BILITOT 0.7 0.7 0.9  PROT 5.8* 5.7* 5.1*  ALBUMIN 3.4* 3.4* 2.9*   No results for input(s): "LIPASE", "AMYLASE" in the last 168 hours. No results for input(s): "AMMONIA" in the last 168 hours.  ABG    Component Value Date/Time   PHART 7.350 08/07/2022 0520   PCO2ART 32.7 08/07/2022 0520   PO2ART 92 08/07/2022 0520   HCO3 17.9 (L) 08/07/2022 0520   TCO2 19 (L) 08/07/2022 0520   ACIDBASEDEF 7.0 (H) 08/07/2022 0520   O2SAT 97 08/07/2022 0520     Coagulation Profile: Recent Labs  Lab 08/06/22 1030  INR 1.2    Cardiac Enzymes: No results for input(s): "CKTOTAL", "CKMB", "CKMBINDEX", "TROPONINI" in the last 168 hours.  HbA1C: Hemoglobin A1C  Date/Time Value Ref Range Status  01/03/2022 09:57 AM 6.9 (A) 4.0 - 5.6 % Final  03/12/2020 12:00 AM 8.2  Final  02/12/2019 12:00 AM 8.4  Final    Comment:    at [facility]    Hgb A1c MFr Bld  Date/Time Value Ref Range Status  05/30/2022 07:26 AM 8.8 (H) 4.6 - 6.5 %  Final    Comment:    Glycemic Control Guidelines for People with Diabetes:Non Diabetic:  <6%Goal of Therapy: <7%Additional Action Suggested:  >8%   01/04/2021 07:57 AM 8.1 (H) 4.6 - 6.5 % Final    Comment:    Glycemic Control Guidelines for People with Diabetes:Non Diabetic:  <6%Goal of Therapy: <7%Additional Action Suggested:  >8%     CBG: Recent Labs  Lab 08/06/22 1515 08/06/22 1919 08/06/22 2324 08/07/22 0412 08/07/22 0804  GLUCAP 283* 281* 264* 206* 157*    Review of Systems:   Unable to obtain, intubated sedation   Past Medical History:  He,  has a past medical history of Agent orange exposure (1968), Arthritis, CAD (coronary artery disease), Complex tear of medial meniscus of left knee as current injury (07/26/2012), Diabetes mellitus, Diverticulitis of colon with perforation (05/23/2016), DVT (deep venous thrombosis) ([place]), GERD (gastroesophageal reflux disease), Glaucoma (2006), colonic polyps, Hyperlipidemia, Hypertension, Intra-abdominal abscess ([place]) (05/16/2016), LUMBAR RADICULOPATHY (07/18/2007), Myocardial infarction ([place]), and Sleep apnea.   Surgical History:   Past Surgical History:  Procedure Laterality Date   [place] N/A 05/23/2016   Procedure: SMALL BOWEL RESECTION;  Surgeon: Jackolyn Confer, MD;  Location: [facility];  Service: General;  Laterality: N/A;   CARDIAC CATHETERIZATION  08/11/2008   angiographically patent LAD diagonal, circumflex and multiple circumflex obtuse marginal branches from the L coronary artery; stenting of mid R coronary artery with 3.5 x 45m Liberte non DES postdilated w/ 4.0 DuraStent ([name]   CARDIOVASCULAR STRESS TEST  06/16/2011   R/P MV - moderate perfusion defect in basal inferoseptal, basal inferior, mid inferseptal and mid inferior regions, consistent with infarct/scar and/or diaphragmatic attenuation; non-gated  study secondary to ectopy; no CP or EKG changes for ischemia; abnormal study although no significant changes from previous study; in the absense of gated images cannot calculate EF or distinguish scar/artifact   COLONOSCOPY  08/2016   TAx5, sessile serrated polyp, diverticulosis, rpt 2 yrs ([place])   COLONOSCOPY  10/2020   1.5cm SSP, HP, rpt (Ardis Hughs   COLONOSCOPY  08/2020   multiple polpys removed (TA, HP), 1.5cm polyp biopsied (SSP), colon lipoma (Ardis Hughs   COLONOSCOPY WITH PROPOFOL N/A 11/11/2020   Procedure: COLONOSCOPY WITH PROPOFOL;  Surgeon: JMilus Banister MD;  [facility];  Service: Endoscopy;  Laterality: N/A;   COLOSTOMY Left 05/23/2016   Procedure: COLOSTOMY;  Surgeon: TJackolyn Confer MD;  Location: [facility];  Service: General;  Laterality: Left;   COLOSTOMY TAKEDOWN N/A 11/10/2016   Procedure: LAPAROSCOPIC LYSIS OF ADHESIONS AND  COLOSTOMY CLOSURE;  Surgeon: TJackolyn Confer MD;  [facility];  Service: General;  Laterality: N/A;   CORONARY/GRAFT ACUTE MI REVASCULARIZATION N/A 08/06/2022   Procedure: Coronary/Graft Acute MI Revascularization;  Surgeon: SBelva Crome MD;  [facility];  Service: Cardiovascular;  Laterality: N/A;   CYST EXCISION  04/2018   posterior neck ([place])   DOPPLER ECHOCARDIOGRAPHY  08/20/2008   EF >>09% LV systolic function normal; LV mildly dilated; doppler flow suggestive of impaired LV relaxation; RV mildly dilated, RV systolic function normal, RV systolic pressure normal   ESOPHAGOGASTRODUODENOSCOPY  08/2020   gastritis, duodenitis likely NSAID/ASA related, H pylori neg - rec stop aspirin (Ardis Hughs   INCISION AND DRAINAGE ABSCESS N/A 05/23/2016   Procedure: DRAINAGE OF MULTIPLE INTRA  ABDOMINAL ABSCESS;  Surgeon: TJackolyn Confer MD;  [facility];  Service: General;  Laterality: N/A;   KNEE SURGERY Left 2015   torn meniscus ([place]  LAPAROSCOPIC LYSIS OF ADHESIONS  11/10/2016   Procedure: LAPAROSCOPIC LYSIS OF  ADHESIONS;  Surgeon: Jackolyn Confer, MD;  [facility];  Service: General;;   LAPAROTOMY N/A 05/23/2016   Procedure: EXPLORATORY LAPAROTOMY;  Surgeon: Jackolyn Confer, MD;  [facility];  Service: General;  Laterality: N/A;   LEFT HEART CATH AND CORONARY ANGIOGRAPHY N/A 08/06/2022   Procedure: LEFT HEART CATH AND CORONARY ANGIOGRAPHY;  Surgeon: Belva Crome, MD;  Location: [facility];  Service: Cardiovascular;  Laterality: N/A;   MASS EXCISION N/A 05/22/2018   Procedure: EXCISION OF CYST ON POSTERIOR NECK ERAS PATHWAY;  Surgeon: Erroll Luna, MD;  Location: [facility];  Service: General;  Laterality: N/A;   PERCUTANEOUS CORONARY STENT INTERVENTION (PCI-S)  1992, 1999, 2004   4 vessels [name])   POLYPECTOMY  11/11/2020   Procedure: POLYPECTOMY;  Surgeon: Milus Banister, MD;  [facility];  Service: Endoscopy;;   PROCTOSCOPY  11/10/2016   Procedure: PROCTOSCOPY;  Surgeon: Jackolyn Confer, MD;  [facility];  Service: General;;   SUBMUCOSAL LIFTING INJECTION  11/11/2020   Procedure: SUBMUCOSAL LIFTING INJECTION;  Surgeon: Milus Banister, MD;  Location: [facility];  Service: Endoscopy;;     Social History:   reports that he has been smoking cigarettes. He started smoking about 63 years ago. He has a 50.00 pack-year smoking history. He has never used smokeless tobacco. He reports current alcohol use. He reports that he does not use drugs.   Family History:  His family history includes Alzheimer's disease in his mother; Colon cancer in his maternal grandfather; Diabetes in his brother, paternal grandfather, and son; Heart attack in his brother; Heart disease in his brother, father, and maternal grandmother. There is no history of Esophageal cancer, Rectal cancer, or Stomach cancer.   Allergies Allergies  Allergen Reactions   Dilaudid [Hydromorphone Hcl] Itching   Ambien [Zolpidem] Other (See Comments)    Pt states "makes me wild"   Niacin And  Related Other (See Comments)    Extreme fatigue     Home Medications  Prior to Admission medications   Medication Sig Start Date End Date Taking? Authorizing Provider  atenolol (TENORMIN) 25 MG tablet Take 1 tablet (25 mg total) by mouth every morning AND 0.5 tablets (12.5 mg total) every evening. 10/25/21   Troy Sine, MD  atorvastatin (LIPITOR) 40 MG tablet TAKE 1 TABLET BY MOUTH ONCE DAILY AT  [facility] 06/05/22   Troy Sine, MD  Brinzolamide-Brimonidine 1-0.2 % SUSP Apply 1 drop to eye 3 (three) times daily.     [provider]  cetirizine (ZYRTEC) 10 MG tablet Take 10 mg by mouth daily as needed for allergies.    [provider]  clopidogrel (PLAVIX) 75 MG tablet Take 1 tablet by mouth once daily 04/12/22   [name], MD  Coenzyme Q10 (COQ10) 200 MG CAPS Take 200 mg by mouth daily.    [provider]  empagliflozin (JARDIANCE) 10 MG TABS tablet Take 1 tablet (10 mg total) by mouth daily before breakfast. 01/03/22   [name], MD  ezetimibe (ZETIA) 10 MG tablet Take 1 tablet by mouth once daily 01/23/22   Troy Sine, MD  famotidine (PEPCID) 40 MG tablet Take 1 tablet (40 mg total) by mouth 2 (two) times daily. 09/03/20   Milus Banister, MD  fluticasone (FLONASE) 50 MCG/ACT nasal spray Place 2 sprays into both nostrils daily. 09/01/20   [name], MD  gabapentin ([place])  100 MG capsule Take 100 mg by mouth 3 (three) times daily.    [provider]  glimepiride (AMARYL) 2 MG tablet Take 1 tablet (2 mg total) by mouth daily before breakfast. 06/06/22   [name], MD  isosorbide mononitrate (IMDUR) 30 MG 24 hr tablet Take 1 tablet (30 mg total) by mouth daily. 10/25/21   Troy Sine, MD  latanoprost (XALATAN) 0.005 % ophthalmic solution Place 1 drop into both eyes at bedtime.    [provider]  lisinopril (ZESTRIL) 5 MG tablet Take 1 tablet by mouth once daily 07/04/22   [name], MD  metFORMIN  (GLUCOPHAGE) 1000 MG tablet TAKE 1 TABLET BY MOUTH TWICE DAILY WITH MEALS 07/04/22   [name], MD  nitroGLYCERIN (NITROSTAT) 0.4 MG SL tablet Place 1 tablet (0.4 mg total) under the tongue every 5 (five) minutes as needed for chest pain. 05/03/20   Troy Sine, MD  omega-3 acid ethyl esters (LOVAZA) 1 g capsule Take 2 capsules by mouth twice daily 07/04/22   [name], MD  pioglitazone (ACTOS) 30 MG tablet Take 1 tablet by mouth once daily 01/23/22   [name], MD  senna-docusate (SENOKOT-S) 8.6-50 MG tablet Take 1 tablet by mouth at bedtime as needed for mild constipation. 06/02/16   Dhungel, Flonnie Overman, MD  TURMERIC CURCUMIN PO Take by mouth daily.    [provider]     Critical care time: 42 min      Georgann Housekeeper, AGACNP-BC [place] for personal pager PCCM on call pager [phone] until 7pm. Please call Elink 7p-7a. [phone]  08/07/2022 11:04 AM

## 2022-08-07 NOTE — Progress Notes (Signed)
Echocardiogram 2D Echocardiogram has been performed.  Evan Avery 08/07/2022, 10:02 AM

## 2022-08-07 NOTE — Progress Notes (Addendum)
[facility] Progress Note Patient Name: Evan Avery DOB: 04/04/1946 MRN: [id number]   Date of Service  08/07/2022  HPI/Events of Note  Notified of fever with tmax 100.5F.  Pt is not on antibiotics.  WBC 16.2.   CXR done earlier shows good placement of the central line.  Cardiomegaly with central pulmonary vascular congestion.  K 5.6 <-- 7.4.  eICU Interventions  Get urinalysis, blood cultures.  Start on empiric antibiotics Vanc and Zosyn.  Will defer TTM given little benefit at this point.  Apply ice packs - no NSAIDs due to AKI or tylenol given shock liver.  Give another dose of lokelma.     Intervention Category Intermediate Interventions: Infection - evaluation and management;Electrolyte abnormality - evaluation and management  Elsie Lincoln 08/07/2022, 1:49 AM

## 2022-08-08 LAB — CBC
HCT: 34.1 % — ABNORMAL LOW (ref 39.0–52.0)
Hemoglobin: 11.4 g/dL — ABNORMAL LOW (ref 13.0–17.0)
MCH: 33.4 pg (ref 26.0–34.0)
MCHC: 33.4 g/dL (ref 30.0–36.0)
MCV: 100 fL (ref 80.0–100.0)
Platelets: 81 10*3/uL — ABNORMAL LOW (ref 150–400)
RBC: 3.41 MIL/uL — ABNORMAL LOW (ref 4.22–5.81)
RDW: 14.6 % (ref 11.5–15.5)
WBC: 8.9 10*3/uL (ref 4.0–10.5)
nRBC: 0 % (ref 0.0–0.2)

## 2022-08-08 LAB — GLUCOSE, CAPILLARY
Glucose-Capillary: 108 mg/dL — ABNORMAL HIGH (ref 70–99)
Glucose-Capillary: 103 mg/dL — ABNORMAL HIGH (ref 70–99)
Glucose-Capillary: 137 mg/dL — ABNORMAL HIGH (ref 70–99)
Glucose-Capillary: 160 mg/dL — ABNORMAL HIGH (ref 70–99)
Glucose-Capillary: 132 mg/dL — ABNORMAL HIGH (ref 70–99)
Glucose-Capillary: 129 mg/dL — ABNORMAL HIGH (ref 70–99)

## 2022-08-08 LAB — BASIC METABOLIC PANEL
Anion gap: 12 (ref 5–15)
BUN: 33 mg/dL — ABNORMAL HIGH (ref 8–23)
CO2: 21 mmol/L — ABNORMAL LOW (ref 22–32)
Calcium: 9 mg/dL (ref 8.9–10.3)
Chloride: 108 mmol/L (ref 98–111)
Creatinine, Ser: 2.01 mg/dL — ABNORMAL HIGH (ref 0.61–1.24)
GFR, Estimated: 34 mL/min — ABNORMAL LOW (ref >60)
Glucose, Bld: 119 mg/dL — ABNORMAL HIGH (ref 70–99)
Potassium: 3.8 mmol/L (ref 3.5–5.1)
Sodium: 141 mmol/L (ref 135–145)

## 2022-08-08 LAB — PHOSPHORUS: Phosphorus: 4.7 mg/dL — ABNORMAL HIGH (ref 2.5–4.6)

## 2022-08-08 LAB — MAGNESIUM: Magnesium: 1.8 mg/dL (ref 1.7–2.4)

## 2022-08-08 LAB — LIPOPROTEIN A (LPA): Lipoprotein (a): 41.2 nmol/L — ABNORMAL HIGH (ref <75.0)

## 2022-08-08 MED ORDER — HYDRALAZINE HCL 20 MG/ML IJ SOLN: 5.0000 mg | Freq: Four times a day (QID) | INTRAMUSCULAR | Status: DC | PRN

## 2022-08-08 MED ORDER — INSULIN ASPART 100 UNIT/ML IJ SOLN
0.0000 [IU] | Freq: Three times a day (TID) | INTRAMUSCULAR | Status: DC
  Administered 2022-08-08: 1 [IU] via SUBCUTANEOUS
  Administered 2022-08-08 – 2022-08-09 (×3): 2 [IU] via SUBCUTANEOUS
  Administered 2022-08-10 (×2): 1 [IU] via SUBCUTANEOUS

## 2022-08-08 MED ORDER — EZETIMIBE 10 MG PO TABS
10.0000 mg | ORAL_TABLET | Freq: Every day | ORAL | Status: DC
  Administered 2022-08-08 – 2022-08-10 (×3): 10 mg via ORAL
  Filled 2022-08-08 (×3): qty 1

## 2022-08-08 MED ORDER — SODIUM CHLORIDE 0.9% FLUSH
3.0000 mL | Freq: Two times a day (BID) | INTRAVENOUS | Status: DC
  Administered 2022-08-09 – 2022-08-10 (×2): 3 mL via INTRAVENOUS

## 2022-08-08 MED ORDER — GABAPENTIN 100 MG PO CAPS
100.0000 mg | ORAL_CAPSULE | Freq: Three times a day (TID) | ORAL | Status: DC
  Administered 2022-08-08 – 2022-08-10 (×8): 100 mg via ORAL
  Filled 2022-08-08 (×8): qty 1

## 2022-08-08 MED ORDER — MAGNESIUM SULFATE 2 GM/50ML IV SOLN
2.0000 g | Freq: Once | INTRAVENOUS | Status: AC
  Administered 2022-08-08: 2 g via INTRAVENOUS
  Filled 2022-08-08: qty 50

## 2022-08-08 NOTE — Progress Notes (Signed)
[facility] Progress Note Patient Name: Evan Avery DOB: [birth year] MRN: [id number]   Date of Service  08/08/2022  HPI/Events of Note  Nursing request to D/C central venous line. Patient off vasopressors for 24 hours. Cental line no longer needed. Nursing states that patient has 2 good PIV's.  eICU Interventions  Will D/C central venous catheter.      Intervention Category Major Interventions: Other:  Deklynn Charlet Cornelia Copa 08/08/2022, 8:22 PM

## 2022-08-08 NOTE — Progress Notes (Signed)
Rounding Note    Patient Name: Evan Avery GROSSER Date of Encounter: 08/08/2022  [place] Cardiologist: Shelva Majestic, MD   Patient Profile     76 y.o. male with long-standing  CAD (PCI x 4 BMS in RCA - in 1990s), PAD, HTN, HLD, Obesity, & OSA who presented on 08/06/2021 with Ischemic VF Cardiac Arrest with ROSC in setting of ACS/NSTEMI.   Witnessed Cardiac Arrest @ home -> bystander CPR + EMS 6 shocks for VF before ROSC. Intubated due to agitation/Resp Failure with Hypoxia/Hypercapnea). Underwent cardiac cath with PCI to the RCA restoring TIMI-3 flow with notable residual in-stent restenosis. Extubated 08/07/2022.  Assessment & Plan    Principal Problem:   Cardiac arrest with successful resuscitation [facility]) Active Problems:   Ischemic cardiomyopathy   Ventricular fibrillation ([place]) - Ischemic - with Inferior MI - subTotal mRCA pre-stent   Acute respiratory failure with hypoxia (HCC)   AKI (acute kidney injury) ([place])   Shock ([place])   Non-ST elevation (NSTEMI) myocardial infarction ([place])   Coronary artery disease involving native coronary artery of native heart with unstable angina pectoris ([place])   ST segment depression   Presence of bare metal stent in right coronary artery - BMS x 4 to RCA in 1990s.   Presence of drug-eluting stent in right coronary artery   Type 2 diabetes mellitus with CAD   Hyperlipidemia associated with type 2 diabetes mellitus ([place]), target LDL <70   OSA on CPAP   Severe obesity (BMI 35.0-39.9) with comorbidity ([place])   Smoker   COPD (chronic obstructive pulmonary disease) (HCC)   Stenosis of right subclavian artery (HCC)   Advanced care planning/counseling discussion   Metabolic alkalosis with respiratory acidosis   Acute hyperkalemia - in setting of Combined Metabolic/Respiratory Acidosis  Principal Problem:   Cardiac arrest with successful resuscitation [facility])  Ventricular fibrillation ([place]) - Ischemic - with Inferior MI - subTotal mRCA  pre-stent /  Coronary artery disease involving native coronary artery of native heart with unstable angina pectoris ([place]) - Presence of bare metal stent in right coronary artery - BMS x 4 to RCA in 1990s./ Non-ST elevation    (NSTEMI) myocardial infarction ([place]) / ST segment depression => Presence of drug-eluting stent in right coronary artery S/p PCI RCA - on DAPT => in the past he had noted issues with aspirin and GI concerns.  Would like to try to get at least 3 months of DAPT before switching to monotherapy. Continue high-dose statin. Blood pressure level seems to be improving, will likely start to consider ARB/ARNI plus or minus spironolactone tomorrow if renal function continues to improve.-  Heart rates improved, still sinus bradycardia.  Has been off of amiodarone for a while now.  Hold off on starting beta-blocker.     Ischemic cardiomyopathy - EF 30-35% by LVGram -- Echo revealed EF of 45 to 50% which is notably improved. Thankfully, EF is better and more reassuring.  We will gradually try to titrate meds during his hospitalization but will hopefully get close to discharge management soon.    Acute on chronic respiratory failure with hypoxia (HCC)- Smoker /   COPD (chronic obstructive pulmonary disease) (HCC) / OSA on CPAP Per PCCM - hoping to initiate Vent Wean Awake & following commands - On Abx for ? Asp. PNA - per PCCM    AKI (acute kidney injury) ([place]) with Metabolic alkalosis with respiratory acidosis &   Acute hyperkalemia - in setting of Combined Metabolic/Respiratory Acidosis => steadily improving.  Had brisk urine output yesterday likely post ATN diuresis.  Creatinine level is down to 2.1 now.  No longer on bicarb drip.  Acidosis is also resolved.  Bicarb up to 21. Despite normal LVEDP on cath, he was net positive initially.  Now seems to be stabilizing out. LVEDP was normal on Cath= >Net ++ - but is making urine - may need mild diuresis to allow vent wean (defer to PCCM)     Shock ([place]) - off of pressors / resolved Not yet able to initiate BB / ARB-ARNI or Spironolcatone  per GDMT        Type 2 diabetes mellitus with CAD /   Hyperlipidemia associated with type 2 diabetes mellitus ([place]), target LDL <70 High-dose statin (was on 40 mg atorvastatin, increased to 80) SSI while inpt- pending improvement of renal fxn, consider SGLT2-I (pre-d/c)      Severe obesity (BMI 35.0-39.9) with comorbidity ([place]) [place] consult once stable - will need Nutrition counseling     On PPI, colace; SQ Heparin DVT Prophylaxis.   DISPO: Plan is to ambulate in the hallway today and then potentially transfer to telemetry.  Will defer to PCCM.  We will take over as attending when he arrives to the floor.  Will need cardiac rehab evaluation and treatment, further titration of medications.  =================================================================== Subjective   Awake and alert, sitting upright in the bed.  Is notably uncomfortable from rib cage pain, not having any angina pain.  Hands are no longer is puffy.  Inpatient Medications    Scheduled Meds:  aspirin  81 mg Oral Daily   atorvastatin  80 mg Oral Daily   Chlorhexidine Gluconate Cloth  6 each Topical Daily   Chlorhexidine Gluconate Cloth  6 each Topical Q0600   clopidogrel  75 mg Oral Daily   docusate sodium  100 mg Oral BID   ezetimibe  10 mg Oral Daily   gabapentin  100 mg Oral TID   heparin  5,000 Units Subcutaneous Q8H   insulin aspart  0-9 Units Subcutaneous TID WC   insulin glargine-yfgn  5 Units Subcutaneous Daily   lidocaine  1 patch Transdermal Q24H   mupirocin ointment  1 Application Nasal BID   pantoprazole  40 mg Oral QHS   polyethylene glycol  17 g Oral Daily   sodium bicarbonate  50 mEq Intravenous Once   sodium chloride flush  10-40 mL Intracatheter Q12H   sodium chloride flush  3 mL Intravenous Q12H   sodium chloride flush  3 mL Intravenous Q12H   Continuous Infusions:  sodium chloride     sodium  chloride     sodium chloride     sodium chloride Stopped (08/07/22 0317)   cefTRIAXone (ROCEPHIN)  IV Stopped (08/07/22 1424)   lactated ringers     PRN Meds: sodium chloride, sodium chloride, acetaminophen, docusate sodium, fentaNYL (SUBLIMAZE) injection, midazolam, ondansetron (ZOFRAN) IV, mouth rinse, oxyCODONE, phenol, polyethylene glycol, sodium chloride flush, sodium chloride flush, sodium chloride flush   Vital Signs    Vitals:   08/08/22 0500 08/08/22 0600 08/08/22 0700 08/08/22 0754  BP: (!) 118/56 133/63    Pulse: (!) 59 61 61   Resp: _0 Temp:    98 F (36.7 C)  TempSrc:    Oral  SpO2: 90% 95% 99%   Weight: 92 kg     Height:        Intake/Output Summary (Last 24 hours) at 08/08/2022 0944 Last data filed at 08/08/2022  0600 Gross per 24 hour  Intake 474.53 ml  Output 2120 ml  Net -1645.47 ml      08/08/2022    5:00 AM 08/07/2022    1:15 AM 06/06/2022    9:22 AM  Last 3 Weights  Weight (lbs) 202 lb 13.2 oz 229 lb 228 lb 2 oz  Weight (kg) 92 kg 103.874 kg 103.477 kg      Telemetry    S Brady 50 to 60s.- Personally Reviewed  ECG    Not checked  Cardiac Studies   Cardiac Cath PCI 08/06/2022: Ischemic VF Cardiac Arrest with ROSC in setting of ACS/NSTEMI mRCA 99% (TIMI 2 flow) just above the previously placed BMS.  Left-to-right collaterals.  Heavily calcified vessel with the proximal segment segmentally 50% narrowed/calcified. PTCA-PCI with overlapping Synergy XD DES 3.5 x 28 postdilated with 3.75 mm 20 mm [place] balloon to high ATM => 99% lesion reduced to 0, but persistent underexpansion roughly 50% in the more proximal segment. Left main, LAD and LCx patent. EF estimated 30 to 35% with EDP 17%.  Severely hypokinetic to akinetic inferior wall (basal to apical)  TTE 08/07/2022: EF 45 to 50%.  Mildly reduced function.  Inferior hypokinesis.  Mild LV dilation.  GRII DD.  Mild to moderate RV dysfunction with normal pressures.  Mild biatrial dilation.  Mild  MAC as well as mild aortic calcification -AOV sclerosis without stenosis  Physical Exam   GEN: Awake and alert.  Somewhat uncomfortable.  Chest wall pain. Neck no real JVP. Cardiac: RR -remains Brady - distant sounds, but no obviousl M/R/G. (Difficult to hear due to by habitus.  s).  Respiratory: coarse upper airway Breath Sounds from Vent - no rales GI: Soft/NT/ND/NABS. venetral hernia noted.  MS: Left third spacing Neuro: Awake and alert.  No obvious distress.   Labs    High Sensitivity Troponin:   Recent Labs  Lab 08/06/22 1030 08/06/22 1730  TROPONINIHS 396* >24,000*     Chemistry Recent Labs  Lab 08/06/22 1050 08/06/22 1601 08/06/22 1730 08/06/22 2355 08/07/22 0509 08/07/22 0520 08/07/22 1723 08/08/22 0423  NA  --    < > 135   < > 138 138 140 141  K  --    < > 7.4*   < > 4.5 4.6 3.8 3.8  CL  --   --  109   < > 108  --  107 108  CO2  --   --  16*   < > 19*  --  23 21*  GLUCOSE  --   --  295*   < > 182*  --  125* 119*  BUN  --   --  32*   < > 36*  --  35* 33*  CREATININE  --   --  2.10*   < > 2.21*  --  2.23* 2.01*  CALCIUM  --   --  7.8*   < > 8.6*  --  8.9 9.0  MG 2.2  --   --   --   --   --   --  1.8  PROT  --   --  5.7*  --  5.1*  --  5.5*  --   ALBUMIN  --   --  3.4*  --  2.9*  --  3.1*  --   AST  --   --  237*  --  248*  --  204*  --   ALT  --   --  85*  --  126*  --  134*  --   ALKPHOS  --   --  51  --  43  --  42  --   BILITOT  --   --  0.7  --  0.9  --  0.8  --   GFRNONAA  --   --  32*   < > 30*  --  30* 34*  ANIONGAP  --   --  10   < > 11  --  10 12   < > = values in this interval not displayed.    Lipids No results for input(s): "CHOL", "TRIG", "HDL", "LABVLDL", "LDLCALC", "CHOLHDL" in the last 168 hours.  Hematology Recent Labs  Lab 08/06/22 1730 08/07/22 0509 08/07/22 0520 08/08/22 0423  WBC 16.2* 11.0*  --  8.9  RBC 4.09* 3.88*  --  3.41*  HGB 13.4 12.6* 12.2* 11.4*  HCT 41.6 38.7* 36.0* 34.1*  MCV 101.7* 99.7  --  100.0  MCH 32.8 32.5   --  33.4  MCHC 32.2 32.6  --  33.4  RDW 14.4 14.6  --  14.6  PLT 153 124*  --  81*   Thyroid No results for input(s): "TSH", "FREET4" in the last 168 hours.  BNP Recent Labs  Lab 08/06/22 1030 08/07/22 0509  BNP 257.1* 897.6*    DDimer No results for input(s): "DDIMER" in the last 168 hours.   Radiology    ECHOCARDIOGRAM COMPLETE  Result Date: 08/07/2022    ECHOCARDIOGRAM REPORT   Patient Name:   Evan FRATE Date of Exam: 08/07/2022 Medical Rec #:  [id number]       Height:       69.0 in Accession #:    [id number]      Weight:       229.0 lb Date of Birth:  1946/07/18      BSA:          2.188 m Patient Age:    35 years        BP:           124/67 mmHg Patient Gender: M               HR:           51 bpm. Exam Location:  Inpatient Procedure: 2D Echo, Cardiac Doppler, Color Doppler and Intracardiac            Opacification Agent Indications:    Cardiac arrest I46.9  History:        Patient has prior history of Echocardiogram examinations, most                 recent 08/20/2008. Previous Myocardial Infarction and CAD; Risk                 Factors:Hypertension, Dyslipidemia, Diabetes and GERD.  Sonographer:    Bernadene Person RDCS Referring Phys: [id number] GRACE E BOWSER  Sonographer Comments: Echo performed with patient supine and on artificial respirator. IMPRESSIONS  1. Left ventricular ejection fraction, by estimation, is 45 to 50%. The left ventricle has mildly decreased function. The left ventricle demonstrates regional wall motion abnormalities (see scoring diagram/findings for description). The basal-to-mid inferior LV segments are hypokinetic. The left ventricular internal cavity size was mildly dilated. Left ventricular diastolic parameters are consistent with Grade II diastolic dysfunction (pseudonormalization).  2. Right ventricular systolic function is mildly-to-moderately reduced. The right ventricular size is mildly enlarged. There is normal pulmonary artery systolic pressure. The  estimated right ventricular  systolic pressure is 68.1 mmHg.  3. Left atrial size was mildly dilated.  4. Right atrial size was mildly dilated.  5. The mitral valve is grossly normal. Mild mitral valve regurgitation.  6. The aortic valve is tricuspid. There is mild calcification of the aortic valve. There is mild thickening of the aortic valve. Aortic valve regurgitation is not visualized. Aortic valve sclerosis/calcification is present, without any evidence of aortic stenosis.  7. Aortic dilatation noted. There is borderline dilatation of the ascending aorta, measuring 36 mm. Comparison(s): Compared to prior TTE report in 2009, the LVEF has dropped to 45-50% with inferior wall hypokinesis (previously normal). The RV remains mildly dilated but systolic function is now mildly to moderately reduced. FINDINGS  Left Ventricle: The basal-to-mid inferior LV segment is hypokinetic. Left ventricular ejection fraction, by estimation, is 45 to 50%. The left ventricle has mildly decreased function. The left ventricle demonstrates regional wall motion abnormalities. Definity contrast agent was given IV to delineate the left ventricular endocardial borders. The left ventricular internal cavity size was mildly dilated. There is no left ventricular hypertrophy. Left ventricular diastolic parameters are consistent with Grade II diastolic dysfunction (pseudonormalization). Right Ventricle: The right ventricular size is mildly enlarged. No increase in right ventricular wall thickness. Right ventricular systolic function is mildly-to-moderately reduced. There is normal pulmonary artery systolic pressure. The tricuspid regurgitant velocity is 2.49 m/s, and with an assumed right atrial pressure of 8 mmHg, the estimated right ventricular systolic pressure is 27.5 mmHg. Left Atrium: Left atrial size was mildly dilated. Right Atrium: Right atrial size was mildly dilated. Pericardium: There is no evidence of pericardial effusion. Mitral  Valve: The mitral valve is grossly normal. Mild mitral valve regurgitation. Tricuspid Valve: The tricuspid valve is normal in structure. Tricuspid valve regurgitation is mild. Aortic Valve: The aortic valve is tricuspid. There is mild calcification of the aortic valve. There is mild thickening of the aortic valve. Aortic valve regurgitation is not visualized. Aortic valve sclerosis/calcification is present, without any evidence of aortic stenosis. Pulmonic Valve: The pulmonic valve was normal in structure. Pulmonic valve regurgitation is trivial. Aorta: Aortic dilatation noted. There is borderline dilatation of the ascending aorta, measuring 36 mm. Venous: IVC assessment for right atrial pressure unable to be performed due to mechanical ventilation. IAS/Shunts: The atrial septum is grossly normal.  LEFT VENTRICLE PLAX 2D LVIDd:         6.00 cm      Diastology LVIDs:         4.80 cm      LV e' medial:    4.31 cm/s LV PW:         1.10 cm      LV E/e' medial:  13.0 LV IVS:        1.00 cm      LV e' lateral:   5.43 cm/s LVOT diam:     2.30 cm      LV E/e' lateral: 10.3 LV SV:         62 LV SV Index:   28 LVOT Area:     4.15 cm  LV Volumes (MOD) LV vol d, MOD A2C: 112.0 ml LV vol d, MOD A4C: 127.0 ml LV vol s, MOD A2C: 59.3 ml LV vol s, MOD A4C: 64.9 ml LV SV MOD A2C:     52.7 ml LV SV MOD A4C:     127.0 ml LV SV MOD BP:      54.6 ml RIGHT VENTRICLE RV S prime:  10.70 cm/s TAPSE (M-mode): 1.6 cm LEFT ATRIUM           Index        RIGHT ATRIUM           Index LA diam:      4.10 cm 1.87 cm/m   RA Area:     26.50 cm LA Vol (A2C): 65.7 ml 30.02 ml/m  RA Volume:   87.20 ml  39.85 ml/m LA Vol (A4C): 69.0 ml 31.53 ml/m  AORTIC VALVE LVOT Vmax:   74.50 cm/s LVOT Vmean:  42.400 cm/s LVOT VTI:    0.150 m  AORTA Ao Root diam: 3.50 cm Ao Asc diam:  3.60 cm MITRAL VALVE               TRICUSPID VALVE MV Area (PHT): 2.21 cm    TR Peak grad:   24.8 mmHg MV Decel Time: 343 msec    TR Vmax:        249.00 cm/s MR Peak grad: 51.8  mmHg MR Mean grad: 38.0 mmHg    SHUNTS MR Vmax:      360.00 cm/s  Systemic VTI:  0.15 m MR Vmean:     291.0 cm/s   Systemic Diam: 2.30 cm MV E velocity: 56.20 cm/s MV A velocity: 30.40 cm/s MV E/A ratio:  1.85 Gwyndolyn Kaufman MD Electronically signed by Gwyndolyn Kaufman MD Signature Date/Time: 08/07/2022/11:26:54 AM    Final    DG Chest Port 1 View  Result Date: 08/07/2022 CLINICAL DATA:  Ventilator dependence. EXAM: PORTABLE CHEST 1 VIEW COMPARISON:  08/06/2022 FINDINGS: 0516 hours. Endotracheal tube tip is 3.8 cm above the base of the carina. Distal course of the NG tube not well demonstrated. Left IJ central line tip overlies the proximal SVC level. Interstitial markings are diffusely coarsened with chronic features. The cardio pericardial silhouette is enlarged. Pulmonary vascular congestion again noted. Telemetry leads overlie the chest. IMPRESSION: 1. Endotracheal tube tip is 3.8 cm above the base of the carina. 2. Distal course of the NG tube not well demonstrated. 3. Cardiomegaly with vascular congestion. Electronically Signed   By: Misty Stanley M.D.   On: 08/07/2022 07:19   DG CHEST PORT 1 VIEW  Result Date: 08/06/2022 CLINICAL DATA:  Central line EXAM: PORTABLE CHEST 1 VIEW COMPARISON:  Chest x-ray 08/06/2022 FINDINGS: New left-sided central venous catheter tip projects over the brachiocephalic SVC junction. Enteric tube extends at least to the level of the diaphragm. Endotracheal tube tip is 3 cm above the carina. The heart is enlarged, unchanged. There is central pulmonary vascular congestion. There is no pleural effusion or pneumothorax. No acute fractures are seen. IMPRESSION: 1. New left-sided central venous catheter tip projects over the brachiocephalic SVC junction. 2. Cardiomegaly with central pulmonary vascular congestion. Electronically Signed   By: Ronney Asters M.D.   On: 08/06/2022 21:21   DG Abd Portable 1V  Result Date: 08/06/2022 CLINICAL DATA:  Nasal/orogastric tube  placement. EXAM: PORTABLE ABDOMEN - 1 VIEW COMPARISON:  CT, 09/29/2020. FINDINGS: Nasal/orogastric tube passes below the diaphragm, tip in the left upper quadrant consistent with positioning in the gastric fundus. Visualized lungs show vascular congestion and interstitial thickening. IMPRESSION: Well-positioned nasal/orogastric tube. Electronically Signed   By: Lajean Manes M.D.   On: 08/06/2022 15:56   CARDIAC CATHETERIZATION  Result Date: 08/06/2022 CONCLUSIONS: Cardiac arrest in the setting of ACS identified that the mid RCA above a previously placed stent is 99% stenosis with TIMI grade II flow and recruited left-to-right collaterals  distally.  The vessel is heavily calcified and the entire proximal segment is segmentally 50% narrowed. The culprit lesion was dilated and then stented with a 28 x 3.5 mm Synergy postdilated using high-pressure with a 3.75 x 22 [place] balloon to 17 atm x 2.  TIMI grade III flow established.  0% stenosis was noted at the site of the 99% stenosis but persistent 50% underexpansion was noted proximal to the culprit. Left main is widely patent LAD is widely patent Circumflex is widely patent Inferior wall to the apex is severely hypokinetic to akinetic.  EDP is 17.  EF is 30 to 35%. RECOMMENDATIONS: Unknown if patient took Plavix this morning.  Cangrelor started in Cath Lab.  Once NG tube is in place cangrelor will be discontinued and 300 mg of clopidogrel given per NG tube. Management of ventilator and postcardiac arrest state by critical care medicine.  Intention is for patient to be on aspirin and Plavix. Sheath will be pulled according to protocol when ACT/Angiomax are at target.   CT HEAD WO CONTRAST (5MM)  Result Date: 08/06/2022 CLINICAL DATA:  Trauma EXAM: CT HEAD WITHOUT CONTRAST TECHNIQUE: Contiguous axial images were obtained from the base of the skull through the vertex without intravenous contrast. RADIATION DOSE REDUCTION: This exam was performed according to the  departmental dose-optimization program which includes automated exposure control, adjustment of the mA and/or kV according to patient size and/or use of iterative reconstruction technique. COMPARISON:  None Available. FINDINGS: Brain: No acute intracranial findings are seen. There are no signs of bleeding within the cranium. Ventricles are not dilated. Cortical sulci are prominent. Vascular: Scattered arterial calcifications are seen. Skull: No fracture is seen in calvarium. Sinuses/Orbits: There is mucosal thickening in ethmoid, sphenoid and maxillary sinuses. Other: None. IMPRESSION: No acute intracranial findings are seen in noncontrast CT brain. Atrophy. Chronic sinusitis. Electronically Signed   By: Elmer Picker M.D.   On: 08/06/2022 12:54   DG Chest Portable 1 View  Result Date: 08/06/2022 CLINICAL DATA:  Witnessed collapse.  Intubated. EXAM: PORTABLE CHEST 1 VIEW COMPARISON:  04/08/2019 FINDINGS: Endotracheal tube tip is obscured by overlying support apparatus. This is estimated to be 6-7 cm above the carina. The heart is enlarged. There may be venous hypertension but there is no frank edema at this time. IMPRESSION: Endotracheal tube tip obscured by overlying support apparatus. This is estimated to be 6-7 cm above the carina. Electronically Signed   By: Nelson Chimes M.D.   On: 08/06/2022 10:49     For questions or updates, please contact [place] Please consult [email or web address] for contact info under        Signed, Glenetta Hew, MD  08/08/2022, 9:44 AM

## 2022-08-08 NOTE — Plan of Care (Signed)
Patient progressing well s/p cardiac arrest and STEMI (stent to RCA).  Patient now off the ventilator and doing well with O2 via [place] and using CPAP at night.  Able to use IS and gets to 1500 without any problems.  Patient is forgetful for Short term memory. PT working with patient 3x/week.

## 2022-08-08 NOTE — TOC Initial Note (Signed)
Transition of Care [facility]) - Initial/Assessment Note    Patient Details  Name: Evan Avery MRN: [id number] Date of Birth: [birth year]  Transition of Care [facility]) CM/SW Contact:    Bethena Roys, RN Phone Number: 08/08/2022, 11:22 AM  Clinical Narrative:  Risk for Readmission Assessment completed. Patient was discussed in rounds this morning. Case Manager called the [place] Coordinator this morning to make them aware of hospitalization. Case Manager spoke with Sarina Ill Coordinator and she states the patient is 10% service connected at the [facility]. PCP is Landry Mellow and CSW is Cyndee Brightly at [phone] ext [phone] if the patient needs additional transition of care needs once stable to transition home.        Expected Discharge Plan: [place] Barriers to Discharge: Continued Medical Work up  Expected Discharge Plan and Services Expected Discharge Plan: [place] In-house Referral: NA Discharge Planning Services: CM Consult   Living arrangements for the past 2 months: Single Family Home                    Prior Living Arrangements/Services Living arrangements for the past 2 months: Single Family Home Lives with:: Spouse      Alcohol / Substance Use: Not Applicable Psych Involvement: No (comment)  Admission diagnosis:  Cardiac arrest [facility]) [I46.9] Cardiac arrest with ventricular fibrillation ([place]) [I46.9, I49.01] Patient Active Problem List   Diagnosis Date Noted   Metabolic alkalosis with respiratory acidosis 08/07/2022   Acute hyperkalemia - in setting of Combined Metabolic/Respiratory Acidosis 08/07/2022   Ventricular fibrillation ([place]) - Ischemic - with Inferior MI - subTotal mRCA pre-stent 08/06/2022   Acute respiratory failure with hypoxia ([place]) 08/06/2022   ST segment depression 08/06/2022   AKI (acute kidney injury) ([place]) 08/06/2022   Cardiac arrest with successful resuscitation ([place])  08/06/2022   Shock ([place]) 08/06/2022   Presence of bare metal stent in right coronary artery - BMS x 4 to RCA in 1990s. 08/06/2022   Presence of drug-eluting stent in right coronary artery 08/06/2022   Non-ST elevation (NSTEMI) myocardial infarction ([place]) 08/06/2022   Ischemic cardiomyopathy 08/06/2022   Abdominal wall hernia 06/07/2022   Bradycardia 04/10/2020   Type 2 diabetes mellitus with diabetic nephropathy, without long-term current use of insulin ([place]) 04/10/2020   Headache 04/09/2020   Stenosis of right subclavian artery ([place]) 02/08/2020   Atypical nevus 01/11/2020   GERD (gastroesophageal reflux disease) 09/03/2019   Adenoma of left adrenal gland 08/25/2019   Thoracic aorta atherosclerosis ([place]) 05/18/2019   COPD (chronic obstructive pulmonary disease) ([place]) 05/18/2019   Fatty liver 03/30/2018   Gallstone 03/30/2018   Acute bacterial sinusitis 12/18/2017   Osteoarthritis 03/08/2017   Epidermal cyst of neck 03/06/2017   Primary open angle glaucoma (POAG) of both eyes, moderate stage    Health maintenance examination 10/07/2014   Medicare annual wellness visit, subsequent 10/07/2014   Advanced care planning/counseling discussion 10/07/2014   Smoker 10/07/2014   Bilateral foot pain 09/07/2014   Coronary artery disease involving native coronary artery of native heart with unstable angina pectoris ([place]) 04/17/2014   Hyperlipidemia associated with type 2 diabetes mellitus ([place]), target LDL <70 10/08/2013   Essential hypertension 10/08/2013   Severe obesity (BMI 35.0-39.9) with comorbidity ([place]) 10/08/2013   Complex tear of medial meniscus of left knee as current injury 07/26/2012   Thrombocytopenia ([place]) 10/12/2011   OTHER ATOPIC DERMATITIS AND RELATED CONDITIONS 06/24/2010   LUMBAR RADICULOPATHY 07/18/2007   Type  2 diabetes mellitus with CAD 07/05/2007   HIP PAIN, BILATERAL 07/05/2007   OSA on CPAP 05/07/2007   Agent orange exposure 10/23/1966   PCP:  [name],  MD Pharmacy:   [facility] [address] [place] - [place] [address] [place] [address] Phone: [phone] Fax: [phone]  [facility] ([place]) [place], [place] [address] Phone: [phone] Fax: [phone]     Social Determinants of Health (SDOH) Interventions    Readmission Risk Interventions    08/08/2022   11:21 AM  Readmission Risk Prevention Plan  Transportation Screening Complete  HRI or [place] Complete  Social Work Consult for [place] Planning/Counseling Complete  Palliative Care Screening Not Applicable  Medication Review Press photographer) Referral to Pharmacy

## 2022-08-08 NOTE — Evaluation (Signed)
Physical Therapy Evaluation Patient Details Name: Evan Avery MRN: [id number] DOB: 11/15/1945 Today's Date: 08/08/2022  History of Present Illness  76 yo male admitted 10/15 after cardiac arrest at home s/p CPR and shock x 6 with ROSC after 15 min. 10/15 cath with RCA stent. 10/16 extubated. PMhx: CAD, HLD, DM2, Emphysema, R subclavian artery stenosis, HTN, glaucoma, T2DM  Clinical Impression  Pt pleasant with decreased STM and needing assist for all mobility due to pain and weakness. Pt educated for sequence of events with assist for all transfers and gait. Pt will benefit from acute therapy to maximize mobility, safety and function. Pt with wife of 77 years (reports she can assist but is currently awaiting gallbladder sx) and son can create sleeping area on first floor. Encouraged gait and mobility with nursing and will continue to follow.  Pt on 2L throughout with SPO2 89-95% HR 59-64 Pre gait 131/77 Post gait 147/76        Recommendations for follow up therapy are one component of a multi-disciplinary discharge planning process, led by the attending physician.  Recommendations may be updated based on patient status, additional functional criteria and insurance authorization.  Follow Up Recommendations Home health PT      Assistance Recommended at Discharge Frequent or constant Supervision/Assistance  Patient can return home with the following  A little help with walking and/or transfers;A little help with bathing/dressing/bathroom;Assistance with cooking/housework;Assist for transportation;Help with stairs or ramp for entrance;Direct supervision/assist for medications management    Equipment Recommendations None recommended by PT  Recommendations for Other Services       Functional Status Assessment Patient has had a recent decline in their functional status and demonstrates the ability to make significant improvements in function in a reasonable and predictable amount of  time.     Precautions / Restrictions Precautions Precautions: Fall      Mobility  Bed Mobility Overal bed mobility: Needs Assistance Bed Mobility: Rolling, Sidelying to Sit Rolling: Min assist Sidelying to sit: Mod assist, HOB elevated       General bed mobility comments: physical assist to roll to right and rise from side with HOB elevated    Transfers Overall transfer level: Needs assistance   Transfers: Sit to/from Stand Sit to Stand: Min assist           General transfer comment: cues for hand placement and safety with assist to rise    Ambulation/Gait Ambulation/Gait assistance: Min assist Gait Distance (Feet): 80 Feet Assistive device: Rolling walker (2 wheels) Gait Pattern/deviations: Step-through pattern, Decreased stride length, Trunk flexed   Gait velocity interpretation: <1.31 ft/sec, indicative of household ambulator   General Gait Details: assist to control RW with tactile and verbal cues for upright posture and position in RW. Chair follow but not needed, cues for breathing  Stairs            Wheelchair Mobility    Modified Rankin (Stroke Patients Only)       Balance Overall balance assessment: Needs assistance Sitting-balance support: Feet supported, Bilateral upper extremity supported Sitting balance-Leahy Scale: Fair     Standing balance support: Bilateral upper extremity supported, Reliant on assistive device for balance Standing balance-Leahy Scale: Poor Standing balance comment: reliant on RW and assist for balance                             Pertinent Vitals/Pain Pain Assessment Pain Assessment: 0-10 Pain Score: 5  Pain Location: chest  from compressions Pain Descriptors / Indicators: Aching, Guarding Pain Intervention(s): Limited activity within patient's tolerance, Monitored during session, Repositioned    Home Living Family/patient expects to be discharged to:: Private residence Living Arrangements:  Spouse/significant other Available Help at Discharge: Family;Available 24 hours/day Type of Home: House Home Access: Stairs to enter   [employer] of Steps: 2   Home Layout: Two level;1/2 bath on main level Home Equipment: Conservation officer, nature (2 wheels);Cane - single point      Prior Function Prior Level of Function : Independent/Modified Independent                     Hand Dominance        Extremity/Trunk Assessment   Upper Extremity Assessment Upper Extremity Assessment: Generalized weakness    Lower Extremity Assessment Lower Extremity Assessment: Generalized weakness    Cervical / Trunk Assessment Cervical / Trunk Assessment: Kyphotic  Communication   Communication: No difficulties  Cognition Arousal/Alertness: Awake/alert Behavior During Therapy: WFL for tasks assessed/performed Overall Cognitive Status: Impaired/Different from baseline Area of Impairment: Memory, Orientation, Problem solving                 Orientation Level: Time   Memory: Decreased short-term memory       Problem Solving: Slow processing          General Comments      Exercises     Assessment/Plan    PT Assessment Patient needs continued PT services  PT Problem List Decreased strength;Decreased mobility;Decreased activity tolerance       PT Treatment Interventions Gait training;DME instruction;Therapeutic exercise;Functional mobility training;Stair training;Therapeutic activities;Patient/family education;Balance training    PT Goals (Current goals can be found in the Care Plan section)  Acute Rehab PT Goals Patient Stated Goal: return home PT Goal Formulation: With patient/family Time For Goal Achievement: 08/22/22 Potential to Achieve Goals: Good    Frequency Min 3X/week     Co-evaluation               AM-PAC PT "6 Clicks" Mobility  Outcome Measure Help needed turning from your back to your side while in a flat bed without using  bedrails?: A Little Help needed moving from lying on your back to sitting on the side of a flat bed without using bedrails?: A Lot Help needed moving to and from a bed to a chair (including a wheelchair)?: A Little Help needed standing up from a chair using your arms (e.g., wheelchair or bedside chair)?: A Little Help needed to walk in hospital room?: A Lot Help needed climbing 3-5 steps with a railing? : A Lot 6 Click Score: 15    End of Session Equipment Utilized During Treatment: Gait belt Activity Tolerance: Patient tolerated treatment well Patient left: in chair;with call bell/phone within reach;with chair alarm set Nurse Communication: Mobility status PT Visit Diagnosis: Other abnormalities of gait and mobility (R26.89)    Time: 6045-4098 PT Time Calculation (min) (ACUTE ONLY): 35 min   Charges:   PT Evaluation $PT Eval Moderate Complexity: 1 Mod PT Treatments $Gait Training: 8-22 mins        [place] Office: [place] 08/08/2022, 11:24 AM

## 2022-08-08 NOTE — Progress Notes (Addendum)
NAME:  Evan Avery, MRN:  [id number], DOB:  02/02/1946, LOS: 2 ADMISSION DATE:  08/06/2022, CONSULTATION DATE:  08/06/22 REFERRING MD:  Jeanell Sparrow - EM, CHIEF COMPLAINT:  Vfib arrest    History of Present Illness:  76yo M PMH CAD, HLD, DM2, Emphysema, R subclavian artery stenosis, HTN had a witnessed OOH arrest. CPR underway upon fire arrival, DF x2 for shockable rhythm. On EMS arrival had VF and was DF an additional 4x. Also received epi x5, 2 doses amio (300, 150). Downtime probably 15 min. [place] and I/O placed. Was reportedly purposeful and agitated, received fent and versed with EMS. In ED, purposeful reaching for ETT. Has since been sedated.  EKG with anterior and lateral ST depression  Cardiology consulted and is taking to cath lab.  PCCM to admit in this setting    Pertinent  Medical History   CAD HTN HLD Emphysema OSA   Significant Hospital Events: Including procedures, antibiotic start and stop dates in addition to other pertinent events   10/15 Vfib arrest, purposeful after ROSC, goint to cath lab . 10/16 successfully extubated   Interim History / Subjective:  Complains of discomfort all over, some mild chest discomfort following CPR  Objective   Blood pressure 133/63, pulse 61, temperature 98 F (36.7 C), temperature source Oral, resp. rate 12, height '5\' 9"'$  (1.753 m), weight 92 kg, SpO2 99 %.    Vent Mode: PSV;CPAP FiO2 (%):  [40 %] 40 % Set Rate:  [14 bmp] 14 bmp Vt Set:  [560 mL] 560 mL PEEP:  [5 cmH20] 5 cmH20 Pressure Support:  [5 cmH20] 5 cmH20   Intake/Output Summary (Last 24 hours) at 08/08/2022 0920 Last data filed at 08/08/2022 0600 Gross per 24 hour  Intake 474.53 ml  Output 2120 ml  Net -1645.47 ml   Filed Weights   08/07/22 0115 08/08/22 0500  Weight: 103.9 kg 92 kg    Examination: General: Sitting up in chair he is currently in no acute distress HEENT normocephalic atraumatic no jugular venous distention appreciated Pulmonary: Clear,  diminished bases no accessory use continues on nasal cannula Cardiac: Regular rate and rhythm Abdomen: Soft nontender Extremities: Trace lower extremity edema pulses strong GU: Foley catheter in place. Neuro: Awake oriented no focal deficits.  Resolved Hospital Problem list   Lactic acidosis resolved  Assessment & Plan:   Vfib Arrest secondary to inferior wall MI w/ RCA occlusion 99% now s/p DES 10/16 c/b Ischemic cardiomyopathy, LVEF 35-40% be LV gram CAD  Plan Discontinue arterial line Continue telemetry monitoring High-dose statin Goal-directed medical therapy per cardiology Holding beta-blockade due to bradycardia  Acute respiratory failure with hypoxia due to cardiac arrest OSA on home CPAP -Successfully extubated 10/16 Plan CPAP at at bedtime Ambulate Incentive spirometry Wean oxygen  Possible asp PNA plan Complete 5 d ceftriaxone  Mild post arrest encephalopathy  Plan Supportive care  AKI improving Plan Avoid hypotension Okay to remove Foley Renal dose medications A.m. CHEM  Transaminitis -likely shock liver Plan A.m. LFTs   NAGMA - improving Plan Monitor  DM2 with hyperglycemia Plan Sliding scale insulin (S) w/ meals Hold basal dosing for now   Fall due to cardiac arrest -on antiplt at home Plan PT consult    Best Practice (right click and "Reselect all SmartList Selections" daily)   Diet/type: Regular consistency (see orders) DVT prophylaxis: other -- post cath, per cards  GI prophylaxis: PPI Lines: Arterial Line and yes and it is still needed Foley:  Yes, and it  is still needed Code Status:  full code -- I do see in his recent PCP appointment (06/06/22) he indicated that he would not want prolonged life support and wife would be HCPOA.  Last date of multidisciplinary goals of care discussion [--]  08/08/2022 9:20 AM Ewa Gentry ACNP-BC [place] Pager # [phone] OR # [phone] if no  answer

## 2022-08-09 ENCOUNTER — Encounter (HOSPITAL_COMMUNITY): Payer: Self-pay | Admitting: Internal Medicine

## 2022-08-09 ENCOUNTER — Encounter (HOSPITAL_COMMUNITY): Payer: No Typology Code available for payment source

## 2022-08-09 LAB — BASIC METABOLIC PANEL
Anion gap: 12 (ref 5–15)
BUN: 27 mg/dL — ABNORMAL HIGH (ref 8–23)
CO2: 23 mmol/L (ref 22–32)
Calcium: 9 mg/dL (ref 8.9–10.3)
Chloride: 105 mmol/L (ref 98–111)
Creatinine, Ser: 1.65 mg/dL — ABNORMAL HIGH (ref 0.61–1.24)
GFR, Estimated: 43 mL/min — ABNORMAL LOW (ref >60)
Glucose, Bld: 143 mg/dL — ABNORMAL HIGH (ref 70–99)
Potassium: 3.2 mmol/L — ABNORMAL LOW (ref 3.5–5.1)
Sodium: 140 mmol/L (ref 135–145)

## 2022-08-09 LAB — GLUCOSE, CAPILLARY
Glucose-Capillary: 151 mg/dL — ABNORMAL HIGH (ref 70–99)
Glucose-Capillary: 131 mg/dL — ABNORMAL HIGH (ref 70–99)
Glucose-Capillary: 155 mg/dL — ABNORMAL HIGH (ref 70–99)
Glucose-Capillary: 123 mg/dL — ABNORMAL HIGH (ref 70–99)

## 2022-08-09 LAB — CBC
HCT: 34.4 % — ABNORMAL LOW (ref 39.0–52.0)
Hemoglobin: 11.3 g/dL — ABNORMAL LOW (ref 13.0–17.0)
MCH: 32.2 pg (ref 26.0–34.0)
MCHC: 32.8 g/dL (ref 30.0–36.0)
MCV: 98 fL (ref 80.0–100.0)
Platelets: 82 10*3/uL — ABNORMAL LOW (ref 150–400)
RBC: 3.51 MIL/uL — ABNORMAL LOW (ref 4.22–5.81)
RDW: 14.3 % (ref 11.5–15.5)
WBC: 7.1 10*3/uL (ref 4.0–10.5)
nRBC: 0 % (ref 0.0–0.2)

## 2022-08-09 MED ORDER — FUROSEMIDE 20 MG PO TABS
20.0000 mg | ORAL_TABLET | Freq: Every day | ORAL | Status: DC
  Administered 2022-08-09 – 2022-08-10 (×2): 20 mg via ORAL
  Filled 2022-08-09 (×2): qty 1

## 2022-08-09 MED ORDER — IRBESARTAN 150 MG PO TABS
150.0000 mg | ORAL_TABLET | Freq: Every day | ORAL | Status: DC
  Administered 2022-08-09 – 2022-08-10 (×2): 150 mg via ORAL
  Filled 2022-08-09 (×3): qty 1

## 2022-08-09 MED ORDER — EMPAGLIFLOZIN 10 MG PO TABS
10.0000 mg | ORAL_TABLET | Freq: Every day | ORAL | Status: DC
  Administered 2022-08-09 – 2022-08-10 (×2): 10 mg via ORAL
  Filled 2022-08-09 (×3): qty 1

## 2022-08-09 MED ORDER — POTASSIUM CHLORIDE CRYS ER 20 MEQ PO TBCR
40.0000 meq | EXTENDED_RELEASE_TABLET | ORAL | Status: AC
  Administered 2022-08-09 (×2): 40 meq via ORAL
  Filled 2022-08-09 (×2): qty 2

## 2022-08-09 MED FILL — Heparin Sod (Porcine)-NaCl IV Soln 1000 Unit/500ML-0.9%: INTRAVENOUS | Qty: 1000 | Status: AC

## 2022-08-09 MED FILL — Lidocaine HCl Local Preservative Free (PF) Inj 1%: INTRAMUSCULAR | Qty: 30 | Status: AC

## 2022-08-09 MED FILL — Nitroglycerin IV Soln 100 MCG/ML in D5W: INTRA_ARTERIAL | Qty: 10 | Status: AC

## 2022-08-09 NOTE — Progress Notes (Addendum)
CARDIAC REHAB PHASE I   PRE:  Rate/Rhythm: 61 NSR  BP:  Sitting: 104/86      SaO2: 96 3L  MODE:  Ambulation: 140 ft   POST:  Rate/Rhythm: 78 NSR  BP:  Sitting: 172/66      SaO2: 95 3L  Pt was ambulated through hallway with 4WR with moderate assistance using gait belt. Pt needed moderate assistance getting up from bed and to 4WR. Pt c/p of chest soreness during ambulation but denies cp and sob. Pt was returned to room and educated on MI, stent location, restrictions,  Aspirin and Plavix use, heart healthy and diabetic diet, ex guidelines, NTG use, smoking cessation, and CRPII. Pt was encouraged to use IS 10x/hr. Pt recorded 1270m. Pt has not decided if [facility] [id number] Avery 11:17 AM 08/09/2022

## 2022-08-09 NOTE — Progress Notes (Signed)
Heart Failure Nurse Navigator Progress Note  PCP: [name], MD PCP-Cardiologist: [name] Admission Diagnosis: Cardiac arrest Admitted from: Home via EMS  Presentation:   Evan Avery presented with witnessed collapse from his wife while in the kitchen, hit his head, was found pulseless, wife called 21 and started CPR until EMS arrived. Found to be in V-Fib, was shocked x2, by fire and additional x4 by EMS. CT of head was negative, upon arrival went to cath lab and received DES to RCA for 99% stenosis proximal. BP 100/57, HR 66, intubated. Extubated on 08/07/22. Patient has a 4 year smoking history.   Patient and his wife were educated on the sign and symptoms of heart failure, daily weights, when to call his doctor or go to the ED, Diet/ fluid restrictions ( patient states he probably uses too much salt, but is working on cutting back) Education done on taking all medications as prescribed and attending all medical appointments, both patient and his wife verbalized their understanding of the education and are very interested In coming to HF TOC on 08/23/2022 @ 10 am.   ECHO/ LVEF: 45-50%   Clinical Course:  Past Medical History:  Diagnosis Date   Agent orange exposure 1968   Arthritis    CAD (coronary artery disease)    4 stents in right artery   Complex tear of medial meniscus of left knee as current injury 07/26/2012   [place]   Diabetes mellitus    type 2   Diverticulitis of colon with perforation 05/23/2016   DVT (deep venous thrombosis) (HCC)    GERD (gastroesophageal reflux disease)    Glaucoma 2006   [facility]   Hx of colonic polyps    Hyperlipidemia    Hypertension    Intra-abdominal abscess ([place]) 05/16/2016   LUMBAR RADICULOPATHY 07/18/2007   Qualifier: Diagnosis of  By: Arnoldo Morale MD, John E    Myocardial infarction [facility])    1992   Sleep apnea    uses CPAP regularly     Social History   Socioeconomic History   Marital status: Married    Spouse  name: Enid Derry   Number of children: 2   Years of education: Not on file   Highest education level: GED or equivalent  Occupational History   Occupation: retired  Tobacco Use   Smoking status: Every Day    Packs/day: 1.00    Years: 50.00    Total pack years: 50.00    Types: Cigarettes    Start date: 10/23/1958   Smokeless tobacco: Never  Vaping Use   Vaping Use: Never used  Substance and Sexual Activity   Alcohol use: Yes    Alcohol/week: 0.0 standard drinks of alcohol    Comment: rarely-1 drink every 2-3 months   Drug use: No   Sexual activity: Yes  Other Topics Concern   Not on file  Social History Narrative   Lives with wife and son (with mental issues)   Occupation: retired, was Multimedia programmer for [employer]   Activity: no regular exercise   Diet: good water, fruits/vegetables daily   Social Determinants of Health   Financial Resource Strain: Medium Risk (08/09/2022)   Overall Financial Resource Strain (CARDIA)    Difficulty of Paying Living Expenses: Somewhat hard  Food Insecurity: No Food Insecurity (08/09/2022)   Hunger Vital Sign    Worried About Running Out of Food in the Last Year: Never true    [place] in the Last Year:  Never true  Transportation Needs: No Transportation Needs (02/24/2022)   PRAPARE - Hydrologist (Medical): No    Lack of Transportation (Non-Medical): No  Physical Activity: Inactive (01/02/2020)   Exercise Vital Sign    Days of Exercise per Week: 0 days    Minutes of Exercise per Session: 0 min  Stress: No Stress Concern Present (01/02/2020)   [place]    Feeling of Stress : Not at all  Social Connections: Not on file   Education Assessment and Provision:  Detailed education and instructions provided on heart failure disease management including the following:  Signs and symptoms of Heart Failure When to call the physician Importance of  daily weights Low sodium diet Fluid restriction Medication management Anticipated future follow-up appointments  Patient education given on each of the above topics.  Patient acknowledges understanding via teach back method and acceptance of all instructions.  Education Materials:  "Living Better With Heart Failure" Booklet, HF zone tool, & Daily Weight Tracker Tool.  Patient has scale at home: yes Patient has pill box at home: yes    High Risk Criteria for Readmission and/or Poor Patient Outcomes: Heart failure hospital admissions (last 6 months): 0  No Show rate: 0 Difficult social situation: No Demonstrates medication adherence: Yes Primary Language: English Literacy level: Reading, writing, and comprehension  Barriers of Care:   Diet/ fluid restrictions ( salt) Daily weights Smoking cessation  Considerations/Referrals:   Referral made to Heart Failure Pharmacist Stewardship: yes Referral made to Heart Failure CSW/NCM TOC: No Referral made to Heart & Vascular TOC clinic: Yes, 08/23/22 @ 10 am.  Items for Follow-up on DC/TOC: Diet/ fluid restrictions ( salt) Daily weights Smoking cessation   Earnestine Leys, BSN, RN Heart Failure Transport planner Only

## 2022-08-09 NOTE — Progress Notes (Signed)
Rounding Note    Patient Name: Evan Avery Date of Encounter: 08/09/2022  [place] Cardiologist: Shelva Majestic, MD   Patient Profile     76 y.o. male with long-standing  CAD (PCI x 4 BMS in RCA - in 1990s), PAD, HTN, HLD, Obesity, & OSA who presented on 08/06/2021 with Ischemic VF Cardiac Arrest with ROSC in setting of ACS/NSTEMI.   Witnessed Cardiac Arrest @ home -> bystander CPR + EMS 6 shocks for VF before ROSC. Intubated due to agitation/Resp Failure with Hypoxia/Hypercapnea). Underwent cardiac cath with PCI to the RCA restoring TIMI-3 flow with notable residual in-stent restenosis. Extubated 08/07/2022. Transfer out of ICU to [facility]    Principal Problem:   Cardiac arrest with successful resuscitation [facility]) Active Problems:   Ischemic cardiomyopathy   Ventricular fibrillation ([place]) - Ischemic - with Inferior MI - subTotal mRCA pre-stent   Acute respiratory failure with hypoxia (HCC)   AKI (acute kidney injury) ([place])   Shock ([place])   Non-ST elevation (NSTEMI) myocardial infarction ([place])   Coronary artery disease involving native coronary artery of native heart with unstable angina pectoris ([place])   ST segment depression   Presence of bare metal stent in right coronary artery - BMS x 4 to RCA in 1990s.   Presence of drug-eluting stent in right coronary artery   Type 2 diabetes mellitus with CAD   Hyperlipidemia associated with type 2 diabetes mellitus ([place]), target LDL <70   OSA on CPAP   Severe obesity (BMI 35.0-39.9) with comorbidity ([place])   Smoker   COPD (chronic obstructive pulmonary disease) (HCC)   Stenosis of right subclavian artery (HCC)   Advanced care planning/counseling discussion   Metabolic alkalosis with respiratory acidosis   Acute hyperkalemia - in setting of Combined Metabolic/Respiratory Acidosis  Principal Problem:   Cardiac arrest with successful resuscitation [facility])  Ventricular fibrillation ([place]) - Ischemic - with  Inferior MI - subTotal mRCA pre-stent /  Coronary artery disease involving native coronary artery of native heart with unstable angina pectoris ([place]) - Presence of bare metal stent in right coronary artery - BMS x 4 to RCA in 1990s./ Non-ST elevation    (NSTEMI) myocardial infarction ([place]) / ST segment depression => Presence of drug-eluting stent in right coronary artery S/p PCI RCA - on DAPT => in the past he had noted issues with aspirin and GI concerns.  Would like to try to get at least 3 months of DAPT before switching to monotherapy. Continue high-dose statin. Blood pressure level seems to be improving,-started low-dose ARB (irbesartan 150 mg daily) today.  May consider spironolactone tomorrow if renal function continues to improve.-  Heart rates improved, still sinus bradycardia.  Has been off of amiodarone for a while now.  Hold off on starting beta-blocker.     Ischemic cardiomyopathy - EF 30-35% by LVGram -- Echo revealed EF of 45 to 50% which is notably improved. Thankfully, EF is better and more reassuring.  Continue to titrate medications-adding ARB today, consider either increasing dose or adding spironolactone prior to discharge. On Jardiance    Acute on chronic respiratory failure with hypoxia ([place])- Smoker /   COPD (chronic obstructive pulmonary disease) (HCC) / OSA on CPAP Per PCCM -weaned off vent and extubated.  Now doing well. Awake & following commands - Continuing Abx for ? Asp. PNA -course of antibiotics prescribed by PCCM We will ask for incentive spirometer.    AKI (acute kidney injury) ([place]) with Metabolic alkalosis  with respiratory acidosis &   Acute hyperkalemia - in setting of Combined Metabolic/Respiratory Acidosis => steadily improving.  Creatinine back down to 1.65.  Potassium 3.2 (will actually replete. Despite normal LVEDP on cath, he was net positive initially.  Now seems to be stabilizing out. LVEDP was normal on Cath= does not seem to have any signs of volume  overload, mostly euvolemic.  We will hold off on diuretic for now.    Shock ([place]) - off of pressors / resolved Able to add ARB today, consider spironolactone tomorrow. Holding off on beta-blocker because of bradycardia.        Type 2 diabetes mellitus with CAD /   Hyperlipidemia associated with type 2 diabetes mellitus ([place]), target LDL <70 High-dose statin (was on 40 mg atorvastatin, increased to 80) SSI while inpt- pending improvement of renal fxn, consider SGLT2-I (pre-d/c)      Severe obesity (BMI 35.0-39.9) with comorbidity ([place]) [place] consult once stable - will need Nutrition counseling Ensure [place] consulted.     On PPI, colace; SQ Heparin DVT Prophylaxis.   DISPO: Safely moved out of the ICU to get today.  We will continue to ambulate here today and tomorrow to see how well he does.  His biggest complaint is chest wall pain we have placed a Lidoderm patch.  Hopefully if this is more comfortable he can breathe better.  Would like to consider potential discharge in the next day or 2.  =================================================================== Subjective   Awake and alert.  Mostly complains of chest wall pain.  Hurts to cough and take deep breaths.  Very uncomfortable but not having any anginal pain. He has apparently been up walking around but I have not witnessed it.  Inpatient Medications    Scheduled Meds:  aspirin  81 mg Oral Daily   atorvastatin  80 mg Oral Daily   Chlorhexidine Gluconate Cloth  6 each Topical Q0600   clopidogrel  75 mg Oral Daily   empagliflozin  10 mg Oral Daily   ezetimibe  10 mg Oral Daily   furosemide  20 mg Oral Daily   gabapentin  100 mg Oral TID   heparin  5,000 Units Subcutaneous Q8H   insulin aspart  0-9 Units Subcutaneous TID WC   insulin glargine-yfgn  5 Units Subcutaneous Daily   irbesartan  150 mg Oral Daily   lidocaine  1 patch Transdermal Q24H   mupirocin ointment  1 Application Nasal BID   pantoprazole  40 mg Oral QHS    sodium chloride flush  3 mL Intravenous Q12H   sodium chloride flush  3 mL Intravenous Q12H   Continuous Infusions:  sodium chloride     sodium chloride Stopped (08/07/22 0317)   cefTRIAXone (ROCEPHIN)  IV 2 g (08/09/22 1521)   lactated ringers     PRN Meds: sodium chloride, acetaminophen, docusate sodium, ondansetron (ZOFRAN) IV, mouth rinse, oxyCODONE, phenol, sodium chloride flush   Vital Signs    Vitals:   08/09/22 0733 08/09/22 0800 08/09/22 1105 08/09/22 1338  BP:  (!) 153/69  (!) 136/116  Pulse:  60    Resp:  15    Temp: 98.1 F (36.7 C)  98 F (36.7 C) 97.8 F (36.6 C)  TempSrc: Oral  Oral Oral  SpO2:  95%    Weight:      Height:        Intake/Output Summary (Last 24 hours) at 08/09/2022 1529 Last data filed at 08/09/2022 0800 Gross per 24 hour  Intake 580  ml  Output 1050 ml  Net -470 ml      08/09/2022    5:00 AM 08/08/2022    5:00 AM 08/07/2022    1:15 AM  Last 3 Weights  Weight (lbs) 221 lb 12.5 oz 202 lb 13.2 oz 229 lb  Weight (kg) 100.6 kg 92 kg 103.874 kg      Telemetry    S Brady 50 to 60s.- Personally Reviewed  ECG    Not checked  Cardiac Studies   Cardiac Cath PCI 08/06/2022: Ischemic VF Cardiac Arrest with ROSC in setting of ACS/NSTEMI mRCA 99% (TIMI 2 flow) just above the previously placed BMS.  Left-to-right collaterals.  Heavily calcified vessel with the proximal segment segmentally 50% narrowed/calcified. PTCA-PCI with overlapping Synergy XD DES 3.5 x 28 postdilated with 3.75 mm 20 mm Midfield balloon to high ATM => 99% lesion reduced to 0, but persistent underexpansion roughly 50% in the more proximal segment. Left main, LAD and LCx patent. EF estimated 30 to 35% with EDP 17%.  Severely hypokinetic to akinetic inferior wall (basal to apical)  TTE 08/07/2022: EF 45 to 50%.  Mildly reduced function.  Inferior hypokinesis.  Mild LV dilation.  GRII DD.  Mild to moderate RV dysfunction with normal pressures.  Mild biatrial dilation.  Mild MAC  as well as mild aortic calcification -AOV sclerosis without stenosis  Physical Exam   GEN: Awake and alert.  Somewhat uncomfortable.  Chest wall pain. Neck no real JVP. Cardiac: Distant heart sounds.  Mildly bradycardic but regular rhythm., but no obviousl M/R/G.  Very difficult to hear because pressing on the chest with the stethoscope causes discomfort. Respiratory: coarse upper airway Breath Sounds from Vent - no rales or rhonchi.  Decreased respiratory effort because of discomfort. GI: Soft/NT/ND/NABS. venetral hernia noted.  MS: Last third spacing puffiness then before.  No significant swelling. Neuro: Awake and alert.  No obvious distress.   Labs    High Sensitivity Troponin:   Recent Labs  Lab 08/06/22 1030 08/06/22 1730  TROPONINIHS 396* >24,000*     Chemistry Recent Labs  Lab 08/06/22 1050 08/06/22 1601 08/06/22 1730 08/06/22 2355 08/07/22 0509 08/07/22 0520 08/07/22 1723 08/08/22 0423 08/09/22 0507  NA  --    < > 135   < > 138   < > 140 141 140  K  --    < > 7.4*   < > 4.5   < > 3.8 3.8 3.2*  CL  --   --  109   < > 108  --  107 108 105  CO2  --   --  16*   < > 19*  --  23 21* 23  GLUCOSE  --   --  295*   < > 182*  --  125* 119* 143*  BUN  --   --  32*   < > 36*  --  35* 33* 27*  CREATININE  --   --  2.10*   < > 2.21*  --  2.23* 2.01* 1.65*  CALCIUM  --   --  7.8*   < > 8.6*  --  8.9 9.0 9.0  MG 2.2  --   --   --   --   --   --  1.8  --   PROT  --   --  5.7*  --  5.1*  --  5.5*  --   --   ALBUMIN  --   --  3.4*  --  2.9*  --  3.1*  --   --   AST  --   --  237*  --  248*  --  204*  --   --   ALT  --   --  85*  --  126*  --  134*  --   --   ALKPHOS  --   --  51  --  43  --  42  --   --   BILITOT  --   --  0.7  --  0.9  --  0.8  --   --   GFRNONAA  --   --  32*   < > 30*  --  30* 34* 43*  ANIONGAP  --   --  10   < > 11  --  _0 < > = values in this interval not displayed.    Lipids No results for input(s): "CHOL", "TRIG", "HDL", "LABVLDL", "LDLCALC",  "CHOLHDL" in the last 168 hours.  Hematology Recent Labs  Lab 08/07/22 0509 08/07/22 0520 08/08/22 0423 08/09/22 0507  WBC 11.0*  --  8.9 7.1  RBC 3.88*  --  3.41* 3.51*  HGB 12.6* 12.2* 11.4* 11.3*  HCT 38.7* 36.0* 34.1* 34.4*  MCV 99.7  --  100.0 98.0  MCH 32.5  --  33.4 32.2  MCHC 32.6  --  33.4 32.8  RDW 14.6  --  14.6 14.3  PLT 124*  --  81* 82*   Thyroid No results for input(s): "TSH", "FREET4" in the last 168 hours.  BNP Recent Labs  Lab 08/06/22 1030 08/07/22 0509  BNP 257.1* 897.6*    DDimer No results for input(s): "DDIMER" in the last 168 hours.   Radiology    No results found.   For questions or updates, please contact [place] Please consult [email or web address] for contact info under        Signed, Glenetta Hew, MD  08/09/2022, 3:29 PM

## 2022-08-09 NOTE — Progress Notes (Signed)
Heart Failure Stewardship Pharmacist Progress Note   PCP: [name], MD PCP-Cardiologist: Shelva Majestic, MD    HPI:  76 yo M with PMH of CAD s/p BMS to RCA in 1990s, emphysema, PAD, HTN, HLD, obesity, and OSA.  Presented to the ED on 10/15 after out-of-hospital VF arrest. Found to have 99% in-stent re-stenosis of RCA s/p PCI with DES. EF estimated to be 30-35% by LV gram. Formal ECHO 10/16 showed LVEF 45-50% with regional wall motion abnormalities, G2DD, RV mild-moderately reduced. Extubated on 10/16.   Current HF Medications: Diuretic: furosemide 20 mg PO daily ACE/ARB/ARNI: irbesartan 150 mg daily SGLT2i: Jardiance 10 mg daily  Prior to admission HF Medications: ACE/ARB/ARNI: lisinopril 5 mg daily SGLT2i: Jardiance 10 mg daily  Pertinent Lab Values: Serum creatinine 1.65, BUN 27, Potassium 3.2, Sodium 140, BNP 897.6, Magnesium 1.8, A1c 8.8  Vital Signs: Weight: 221 lbs (admission weight: 202 lbs) Blood pressure: 130-160/70s  Heart rate: 50-60s  I/O: -1.5L yesterday; net even  Medication Assistance / Insurance Benefits Check: Does the patient have prescription insurance?  Yes Type of insurance plan: [place] + Medicare  Outpatient Pharmacy:  Prior to admission outpatient pharmacy: [facility] Is the patient willing to use [facility] at discharge? Yes Is the patient willing to transition their outpatient pharmacy to utilize a [facility] outpatient pharmacy?   Pending    Assessment: 1. ICM CHF (LVEF 45-50%). NYHA class II symptoms. - Agree with starting furosemide 20 mg PO daily. KCl 40 mEq x 2 doses ordered for replacement. Keep K>4 and Mag>2. Recheck magnesium tomorrow AM. - Holding PTA BB with bradycardia - Agree with starting irbesartan 150 mg daily, consider transitioning to Entresto 24/26 mg BID tomorrow for GDMT optimization if BP tolerates - Consider starting spironolactone prior to discharge - Agree with starting Jardiance 10 mg daily  Plan: 1) Medication  changes recommended at this time: - Check magnesium with AM labs - Start spironolactone vs transition ARB to [employer] tomorrow  2) Patient assistance: - Appears to have [place] benefits, involving RNCM to verify prescription benefits with the [place] - If he has active prescription coverage with the [place], he will not need additional assistance for meds  3)  Education  - To be completed prior to discharge  Kerby Nora, PharmD, BCPS Heart Failure Cytogeneticist Phone [phone]

## 2022-08-09 NOTE — TOC Progression Note (Signed)
Transition of Care [facility]) - Progression Note    Patient Details  Name: ISHMEAL RORIE MRN: [id number] Date of Birth: Mar 02, 1946  [facility]) CM/SW Contact  Graves-Bigelow, Ocie Cornfield, RN Phone Number: 08/09/2022, 12:51 PM  Clinical Narrative: Patient was discussed in morning rounds. Spouse was at the bedside, Case Manager attempted to speak with patient and spouse regarding home health physical therapy. Another staff member was in the room and Case Manager stated that she would return or call regarding disposition needs. Case Manager continues to follow for transition of care needs as the patient progresses.   Expected Discharge Plan: [place] Barriers to Discharge: Continued Medical Work up  Expected Discharge Plan and Services Expected Discharge Plan: [place] In-house Referral: NA Discharge Planning Services: CM Consult   Living arrangements for the past 2 months: Single Family Home                 Social Determinants of Health (SDOH) Interventions Food Insecurity Interventions: Intervention Not Indicated Housing Interventions: Intervention Not Indicated Utilities Interventions: Intervention Not Indicated Alcohol Usage Interventions: Intervention Not Indicated (Score <7) Financial Strain Interventions: Intervention Not Indicated  Readmission Risk Interventions    08/08/2022   11:21 AM  Readmission Risk Prevention Plan  Transportation Screening Complete  HRI or Home Care Consult Complete  Social Work Consult for [place] Planning/Counseling Complete  Palliative Care Screening Not Applicable  Medication Review Press photographer) Referral to Pharmacy

## 2022-08-09 NOTE — Progress Notes (Signed)
Mobility Specialist - Progress Note   08/09/22 1517  Mobility  Activity Ambulated with assistance to bathroom  Level of Assistance Contact guard assist, steadying assist  Assistive Device Front wheel walker  Distance Ambulated (ft) 10 ft  Activity Response Tolerated well  Mobility Referral Yes  $Mobility charge 1 Mobility   Pt received in chair and requesting to use BR. Pt c/o general pain throughout ambulation. Pt was left on toilet and instructed to use call bell when finished. RN was also notified.   Larey Seat

## 2022-08-10 ENCOUNTER — Other Ambulatory Visit (HOSPITAL_COMMUNITY): Payer: Self-pay

## 2022-08-10 ENCOUNTER — Other Ambulatory Visit: Payer: Self-pay | Admitting: Cardiology

## 2022-08-10 ENCOUNTER — Telehealth (HOSPITAL_COMMUNITY): Payer: Self-pay

## 2022-08-10 DIAGNOSIS — G4733 Obstructive sleep apnea (adult) (pediatric): Secondary | ICD-10-CM

## 2022-08-10 DIAGNOSIS — Z79899 Other long term (current) drug therapy: Secondary | ICD-10-CM

## 2022-08-10 DIAGNOSIS — Z7189 Other specified counseling: Secondary | ICD-10-CM

## 2022-08-10 LAB — BASIC METABOLIC PANEL
Anion gap: 10 (ref 5–15)
BUN: 24 mg/dL — ABNORMAL HIGH (ref 8–23)
CO2: 23 mmol/L (ref 22–32)
Calcium: 8.6 mg/dL — ABNORMAL LOW (ref 8.9–10.3)
Chloride: 106 mmol/L (ref 98–111)
Creatinine, Ser: 1.59 mg/dL — ABNORMAL HIGH (ref 0.61–1.24)
GFR, Estimated: 44 mL/min — ABNORMAL LOW (ref >60)
Glucose, Bld: 125 mg/dL — ABNORMAL HIGH (ref 70–99)
Potassium: 3.3 mmol/L — ABNORMAL LOW (ref 3.5–5.1)
Sodium: 139 mmol/L (ref 135–145)

## 2022-08-10 LAB — GLUCOSE, CAPILLARY
Glucose-Capillary: 140 mg/dL — ABNORMAL HIGH (ref 70–99)
Glucose-Capillary: 133 mg/dL — ABNORMAL HIGH (ref 70–99)

## 2022-08-10 LAB — MAGNESIUM: Magnesium: 2 mg/dL (ref 1.7–2.4)

## 2022-08-10 MED ORDER — CEFUROXIME AXETIL 500 MG PO TABS
500.0000 mg | ORAL_TABLET | Freq: Two times a day (BID) | ORAL | 0 refills | Status: DC
  Filled 2022-08-10: qty 2, 1d supply, fill #0

## 2022-08-10 MED ORDER — ATORVASTATIN CALCIUM 80 MG PO TABS
80.0000 mg | ORAL_TABLET | Freq: Every day | ORAL | 3 refills | Status: DC
  Filled 2022-08-10: qty 90, 90d supply, fill #0

## 2022-08-10 MED ORDER — SACUBITRIL-VALSARTAN 24-26 MG PO TABS: 1.0000 | ORAL_TABLET | Freq: Two times a day (BID) | ORAL | Status: DC

## 2022-08-10 MED ORDER — DOCUSATE SODIUM 100 MG PO CAPS
100.0000 mg | ORAL_CAPSULE | Freq: Every day | ORAL | Status: DC
  Filled 2022-08-10: qty 1

## 2022-08-10 MED ORDER — SACUBITRIL-VALSARTAN 24-26 MG PO TABS
1.0000 | ORAL_TABLET | Freq: Two times a day (BID) | ORAL | 11 refills | Status: DC
  Filled 2022-08-10: qty 60, 30d supply, fill #0

## 2022-08-10 MED ORDER — POTASSIUM CHLORIDE CRYS ER 20 MEQ PO TBCR
40.0000 meq | EXTENDED_RELEASE_TABLET | Freq: Once | ORAL | Status: AC
  Administered 2022-08-10: 40 meq via ORAL
  Filled 2022-08-10: qty 2

## 2022-08-10 MED ORDER — FUROSEMIDE 20 MG PO TABS
20.0000 mg | ORAL_TABLET | Freq: Every day | ORAL | 11 refills | Status: DC | PRN
  Filled 2022-08-10: qty 30, 30d supply, fill #0

## 2022-08-10 NOTE — Discharge Summary (Addendum)
Discharge Summary    Patient ID: Evan Avery MRN: [id number]; DOB: [birth year]  Admit date: 08/06/2022 Discharge date: 08/10/2022  PCP:  [name], [place] Providers Cardiologist:  Shelva Majestic, MD     Discharge Diagnoses    Principal Problem:   Cardiac arrest with successful resuscitation [facility]) Active Problems:   Type 2 diabetes mellitus with CAD   OSA on CPAP   Hyperlipidemia associated with type 2 diabetes mellitus ([place]), target LDL <70   Severe obesity (BMI 35.0-39.9) with comorbidity ([place])   Coronary artery disease involving native coronary artery of native heart with unstable angina pectoris ([place])   Advanced care planning/counseling discussion   Smoker   COPD (chronic obstructive pulmonary disease) ([place])   Stenosis of right subclavian artery (HCC)   Ventricular fibrillation (HCC) - Ischemic - with Inferior MI - subTotal mRCA pre-stent   Acute respiratory failure with hypoxia (HCC)   ST segment depression   AKI (acute kidney injury) ([place])   Shock ([place])   Presence of bare metal stent in right coronary artery - BMS x 4 to RCA in 1990s.   Presence of drug-eluting stent in right coronary artery   Non-ST elevation (NSTEMI) myocardial infarction [facility])   Ischemic cardiomyopathy   Metabolic alkalosis with respiratory acidosis   Acute hyperkalemia - in setting of Combined Metabolic/Respiratory Acidosis   Diagnostic Studies/Procedures    Cath: 08/06/22: Ischemic VF Cardiac Arrest with ROSC in setting of ACS/NSTEMI mRCA 99% (TIMI 2 flow) just above the previously placed BMS.  Left-to-right collaterals.  Heavily calcified vessel with the proximal segment segmentally 50% narrowed/calcified. PTCA-PCI with overlapping Synergy XD DES 3.5 x 28 postdilated with 3.75 mm 20 mm [place] balloon to high ATM => 99% lesion reduced to 0, but persistent underexpansion roughly 50% in the more proximal segment. Left main, LAD and LCx patent. EF estimated 30 to 35%  with EDP 17%.  Severely hypokinetic to akinetic inferior wall (basal to apical)   Diagnostic         Dominance: Right                                           Intervention    Echo: 08/07/22: EF 45 to 50%.  Mildly reduced function.  Inferior hypokinesis.  Mild LV dilation.  GRII DD.  Mild to moderate RV dysfunction with normal pressures.  Mild biatrial dilation.  Mild MAC as well as mild aortic calcification -AOV sclerosis without stenosis   History of Present Illness     Evan Avery is a 76 y.o. male with a hx of CAD, PAD, HTN, HLD, obesity and OSA who was seen 08/06/2022 for the evaluation of cardiac arrest at the request of Dr Jeanell Sparrow. Ischemic VF Cardiac Arrest with ROSC in setting of ACS/NSTEMI.  Witnessed Cardiac Arrest @ home -> bystander CPR + EMS 6 shocks for VF before ROSC. Intubated due to agitation/Resp Failure with Hypoxia/Hypercapnea).   Hospital Course     Consultants: PCCM   Cardiac arrest / Ventricular fibrillation 2/2 Inferior MI Shock - resolved Had witnessed cardiac arrest at home with bystander CPR, defibrillated x6.  Treated with IV amiodarone, weaned and DC'd Underwent cardiac catheterization noted as above with PCI/DES to RCA.  Placed on DAPT with aspirin/Plavix -- Continue aspirin, Plavix, statin, transitioned to Entresto at discharge -- Unable to add beta-blocker with baseline bradycardia --  plan for addition of spiro as an outpatient   ICM -- EF 30 to 35% via LV gram.   Follow-up echocardiogram showed LVEF of 45 to 50%, inferior hypokinesis, grade 2 diastolic dysfunction, mild to moderate RV dysfunction with normal pressures with mild biatrial enlargement. -- GDMT: Was on irbesartan but transitioned to [place] at discharge, continue Jardiance.  Plan to add spironolactone  as an outpatient -- Lasix 20 mg daily PRN at discharge    Acute on chronic respiratory failure with hypoxia / COPD / OSA-- Initially required intubation, status post extubation 10/16  -- Being  treated with antibiotics for possible aspiration pneumonia, to complete 5-day course of Rocephin -- given single day dose of ceftin at discharge to complete course  HLD -- atorvastatin increased to 35m daily -- continue Lovaza   AKI -- Creatinine peaked at 2.28, now improved to 1.59   Diabetes -- Last hemoglobin A1c (05/2022) 8.8 -- continue Jardiance, resumed on metformin at discharge    Hypokalemia -- K+ 3.3, supplemented -- BMP 10/25   Patient was seen by Dr. HEllyn Hackand deemed stable for discharge home. Follow up arranged. Medications sent to [place] [facility]ated by pharmD prior to discharge.   Did the patient have an acute coronary syndrome (MI, NSTEMI, STEMI, etc) this admission?:  Yes                               AHA/ACC Clinical Performance & Quality Measures: Aspirin prescribed? - Yes ADP Receptor Inhibitor (Plavix/Clopidogrel, Brilinta/Ticagrelor or Effient/Prasugrel) prescribed (includes medically managed patients)? - Yes Beta Blocker prescribed? - Yes High Intensity Statin (Lipitor 40-820mor Crestor 20-4029mprescribed? - Yes EF assessed during THIS hospitalization? - Yes For EF <40%, was ACEI/ARB prescribed? - Not Applicable (EF >/= 40%16%or EF <40%, Aldosterone Antagonist (Spironolactone or Eplerenone) prescribed? - Not Applicable (EF >/= 40%01%ardiac Rehab Phase II ordered (including medically managed patients)? - Yes      The patient will be scheduled for a TOC follow up appointment in 10-14 days.  A [facility] at the office where the patient should be seen for follow up.  _____________  Discharge Vitals Blood pressure (!) 140/69, pulse 55, temperature 98.1 F (36.7 C), temperature source Oral, resp. rate 15, height _0  (1.753 m), weight 106.7 kg, SpO2 92 %.  Filed Weights   08/08/22 0500 08/09/22 0500 08/10/22 0435  Weight: 92 kg 100.6 kg 106.7 kg    Labs & Radiologic Studies    CBC Recent Labs     08/08/22 0423 08/09/22 0507  WBC 8.9 7.1  HGB 11.4* 11.3*  HCT 34.1* 34.4*  MCV 100.0 98.0  PLT 81* 82*   Basic Metabolic Panel Recent Labs    08/08/22 0423 08/09/22 0507 08/10/22 0147  NA 141 140 139  K 3.8 3.2* 3.3*  CL 108 105 106  CO2 21* 23 23  GLUCOSE 119* 143* 125*  BUN 33* 27* 24*  CREATININE 2.01* 1.65* 1.59*  CALCIUM 9.0 9.0 8.6*  MG 1.8  --  2.0  PHOS 4.7*  --   --    Liver Function Tests Recent Labs    08/07/22 1723  AST 204*  ALT 134*  ALKPHOS 42  BILITOT 0.8  PROT 5.5*  ALBUMIN 3.1*   No results for input(s): "LIPASE", "AMYLASE" in the last 72 hours. High Sensitivity Troponin:   Recent Labs  Lab  08/06/22 1030 08/06/22 1730  TROPONINIHS 396* >24,000*    BNP Invalid input(s): "POCBNP" D-Dimer No results for input(s): "DDIMER" in the last 72 hours. Hemoglobin A1C No results for input(s): "HGBA1C" in the last 72 hours. Fasting Lipid Panel No results for input(s): "CHOL", "HDL", "LDLCALC", "TRIG", "CHOLHDL", "LDLDIRECT" in the last 72 hours. Thyroid Function Tests No results for input(s): "TSH", "T4TOTAL", "T3FREE", "THYROIDAB" in the last 72 hours.  Invalid input(s): "FREET3" _____________   DG Chest Port 1 View  Result Date: 08/07/2022 CLINICAL DATA:  Ventilator dependence. EXAM: PORTABLE CHEST 1 VIEW COMPARISON:  08/06/2022 FINDINGS: 0516 hours. Endotracheal tube tip is 3.8 cm above the base of the carina. Distal course of the NG tube not well demonstrated. Left IJ central line tip overlies the proximal SVC level. Interstitial markings are diffusely coarsened with chronic features. The cardio pericardial silhouette is enlarged. Pulmonary vascular congestion again noted. Telemetry leads overlie the chest. IMPRESSION: 1. Endotracheal tube tip is 3.8 cm above the base of the carina. 2. Distal course of the NG tube not well demonstrated. 3. Cardiomegaly with vascular congestion. Electronically Signed   By: Misty Stanley M.D.   On: 08/07/2022 07:19    DG CHEST PORT 1 VIEW  Result Date: 08/06/2022 CLINICAL DATA:  Central line EXAM: PORTABLE CHEST 1 VIEW COMPARISON:  Chest x-ray 08/06/2022 FINDINGS: New left-sided central venous catheter tip projects over the brachiocephalic SVC junction. Enteric tube extends at least to the level of the diaphragm. Endotracheal tube tip is 3 cm above the carina. The heart is enlarged, unchanged. There is central pulmonary vascular congestion. There is no pleural effusion or pneumothorax. No acute fractures are seen. IMPRESSION: 1. New left-sided central venous catheter tip projects over the brachiocephalic SVC junction. 2. Cardiomegaly with central pulmonary vascular congestion. Electronically Signed   By: Ronney Asters M.D.   On: 08/06/2022 21:21   DG Abd Portable 1V  Result Date: 08/06/2022 CLINICAL DATA:  Nasal/orogastric tube placement. EXAM: PORTABLE ABDOMEN - 1 VIEW COMPARISON:  CT, 09/29/2020. FINDINGS: Nasal/orogastric tube passes below the diaphragm, tip in the left upper quadrant consistent with positioning in the gastric fundus. Visualized lungs show vascular congestion and interstitial thickening. IMPRESSION: Well-positioned nasal/orogastric tube. Electronically Signed   By: Lajean Manes M.D.   On: 08/06/2022 15:56    CT HEAD WO CONTRAST (5MM)  Result Date: 08/06/2022 CLINICAL DATA:  Trauma EXAM: CT HEAD WITHOUT CONTRAST TECHNIQUE: Contiguous axial images were obtained from the base of the skull through the vertex without intravenous contrast. RADIATION DOSE REDUCTION: This exam was performed according to the departmental dose-optimization program which includes automated exposure control, adjustment of the mA and/or kV according to patient size and/or use of iterative reconstruction technique. COMPARISON:  None Available. FINDINGS: Brain: No acute intracranial findings are seen. There are no signs of bleeding within the cranium. Ventricles are not dilated. Cortical sulci are prominent. Vascular:  Scattered arterial calcifications are seen. Skull: No fracture is seen in calvarium. Sinuses/Orbits: There is mucosal thickening in ethmoid, sphenoid and maxillary sinuses. Other: None. IMPRESSION: No acute intracranial findings are seen in noncontrast CT brain. Atrophy. Chronic sinusitis. Electronically Signed   By: Elmer Picker M.D.   On: 08/06/2022 12:54   DG Chest Portable 1 View  Result Date: 08/06/2022 CLINICAL DATA:  Witnessed collapse.  Intubated. EXAM: PORTABLE CHEST 1 VIEW COMPARISON:  04/08/2019 FINDINGS: Endotracheal tube tip is obscured by overlying support apparatus. This is estimated to be 6-7 cm above the carina. The heart is enlarged. There  may be venous hypertension but there is no frank edema at this time. IMPRESSION: Endotracheal tube tip obscured by overlying support apparatus. This is estimated to be 6-7 cm above the carina. Electronically Signed   By: Nelson Chimes M.D.   On: 08/06/2022 10:49   Disposition   Pt is being discharged home today in good condition.  Follow-up Plans & Appointments     Follow-up Information     [facility]. Go in 12 day(s).   Specialty: Cardiology Why: Hospital follow up PLEASE bring a current medication list to appointment FREE valet parking, Entrance C, off [employer] information: [address] [id number] [place]. [facility] Follow up on 08/16/2022.   Specialty: Cardiology Why: Please come in for repeat labs Contact information: [place] [phone]               Discharge Instructions     Diet - low sodium heart healthy   Complete by: As directed    Discharge instructions   Complete by: As directed    Radial Site Care Refer to this sheet in the next few weeks. These  instructions provide you with information on caring for yourself after your procedure. Your caregiver may also give you more specific instructions. Your treatment has been planned according to current medical practices, but problems sometimes occur. Call your caregiver if you have any problems or questions after your procedure. HOME CARE INSTRUCTIONS You may shower the day after the procedure. Remove the bandage (dressing) and gently wash the site with plain soap and water. Gently pat the site dry.  Do not apply powder or lotion to the site.  Do not submerge the affected site in water for 3 to 5 days.  Inspect the site at least twice daily.  Do not flex or bend the affected arm for 24 hours.  No lifting over 5 pounds (2.3 kg) for 5 days after your procedure.  Do not drive home if you are discharged the same day of the procedure. Have someone else drive you.  You may drive 24 hours after the procedure unless otherwise instructed by your caregiver.  What to expect: Any bruising will usually fade within 1 to 2 weeks.  Blood that collects in the tissue (hematoma) may be painful to the touch. It should usually decrease in size and tenderness within 1 to 2 weeks.  SEEK IMMEDIATE MEDICAL CARE IF: You have unusual pain at the radial site.  You have redness, warmth, swelling, or pain at the radial site.  You have drainage (other than a small amount of blood on the dressing).  You have chills.  You have a fever or persistent symptoms for more than 72 hours.  You have a fever and your symptoms suddenly get worse.  Your arm becomes pale, cool, tingly, or numb.  You have heavy bleeding from the site. Hold pressure on the site.   PLEASE DO NOT MISS ANY DOSES OF YOUR PLAVIX!!!!! Also keep a log of you blood pressures and bring back to your follow up appt. Please call the office with any questions.   Patients taking blood thinners should generally stay away from medicines like ibuprofen, Advil, Motrin,  naproxen, and Aleve due to risk of stomach bleeding. You may take Tylenol as directed or  talk to your primary doctor about alternatives.   PLEASE ENSURE THAT YOU DO NOT RUN OUT OF YOUR PLAVIX. This medication is very important to remain on for at least one year. IF you have issues obtaining this medication due to cost please CALL the office 3-5 business days prior to running out in order to prevent missing doses of this medication.   Increase activity slowly   Complete by: As directed    No wound care   Complete by: As directed         Discharge Medications   Allergies as of 08/10/2022       Reactions   Dilaudid [hydromorphone Hcl] Itching   Ambien [zolpidem] Other (See Comments)   Pt states "makes me wild"   Niacin And Related Other (See Comments)   Extreme fatigue        Medication List     STOP taking these medications    atenolol 25 MG tablet Commonly known as: TENORMIN   isosorbide mononitrate 30 MG 24 hr tablet Commonly known as: IMDUR   lisinopril 5 MG tablet Commonly known as: ZESTRIL   pioglitazone 30 MG tablet Commonly known as: ACTOS       TAKE these medications    atorvastatin 80 MG tablet Commonly known as: LIPITOR Take 1 tablet (80 mg total) by mouth daily. Start taking on: August 11, 2022 What changed:  medication strength See the new instructions.   Brinzolamide-Brimonidine 1-0.2 % Susp Apply 1 drop to eye 3 (three) times daily.   cefUROXime 500 MG tablet Commonly known as: CEFTIN Take 1 tablet (500 mg total) by mouth 2 (two) times daily with a meal. Start taking on: August 11, 2022   cetirizine 10 MG tablet Commonly known as: ZYRTEC Take 10 mg by mouth daily as needed for allergies.   clopidogrel 75 MG tablet Commonly known as: PLAVIX Take 1 tablet by mouth once daily   CoQ10 200 MG Caps Take 200 mg by mouth daily.   empagliflozin 10 MG Tabs tablet Commonly known as: Jardiance Take 1 tablet (10 mg total) by mouth daily  before breakfast.   ezetimibe 10 MG tablet Commonly known as: ZETIA Take 1 tablet by mouth once daily   famotidine 40 MG tablet Commonly known as: Pepcid Take 1 tablet (40 mg total) by mouth 2 (two) times daily.   fluticasone 50 MCG/ACT nasal spray Commonly known as: FLONASE Place 2 sprays into both nostrils daily.   furosemide 20 MG tablet Commonly known as: LASIX Take 1 tablet (20 mg total) by mouth daily as needed for fluid or edema (for weight gain of 3 pounds or more in one day or for shortness of breath).   gabapentin 100 MG capsule Commonly known as: NEURONTIN Take 100 mg by mouth 3 (three) times daily.   glimepiride 2 MG tablet Commonly known as: AMARYL Take 1 tablet (2 mg total) by mouth daily before breakfast.   latanoprost 0.005 % ophthalmic solution Commonly known as: XALATAN Place 1 drop into both eyes at bedtime.   metFORMIN 1000 MG tablet Commonly known as: GLUCOPHAGE TAKE 1 TABLET BY MOUTH TWICE DAILY WITH MEALS   nitroGLYCERIN 0.4 MG SL tablet Commonly known as: NITROSTAT Place 1 tablet (0.4 mg total) under the tongue every 5 (five) minutes as needed for chest pain.   omega-3 acid ethyl esters 1 g capsule Commonly known as: LOVAZA Take 2 capsules by mouth twice daily   sacubitril-valsartan 24-26 MG Commonly known as: ENTRESTO Take 1  tablet by mouth 2 (two) times daily. Start taking on: August 11, 2022   senna-docusate 8.6-50 MG tablet Commonly known as: Senokot-S Take 1 tablet by mouth at bedtime as needed for mild constipation.   TURMERIC CURCUMIN PO Take 1 capsule by mouth daily.        Outstanding Labs/Studies   BMP 10/25 FLP/LFTs in 8 weeks   Duration of Discharge Encounter   Greater than 30 minutes including physician time.  Signed, Reino Bellis, NP 08/10/2022, 2:32 PM  ATTENDING ATTESTATION  I have seen, examined and evaluated the patient this morning on rounds along with Reino Bellis, NP-C.  After reviewing all the  available data and chart, we discussed the patients laboratory, study & physical findings as well as symptoms in detail.  We also discussed general summary of the patient's hospitalization along with pertinent findings, impressions and recommendations.  As per daily progress note, the plan was to reassess later on the afternoon to consider possibility of discharge today versus tomorrow.  The patient was extremely stable walking with physical therapy.  He clinically is stable for discharge family felt it prudent to discharge him today with plans to further titrate medications in the outpatient setting.  I agree with her findings, examination, impression recommendations and hospital summary as noted above.  See final progress note for full details for the most part the above summary is per final progress note.    Leonie Man, MD, MS Glenetta Hew, MD., M.S. Interventional Cardiologist  [place]  Pager # [phone] Phone # [phone] [address] [place], [place] [address]

## 2022-08-10 NOTE — TOC Transition Note (Signed)
Transition of Care [facility]) - CM/SW Discharge Note   Patient Details  Name: Evan Avery MRN: [id number] Date of Birth: Jan 16, 1946  [facility]) CM/SW Contact:  Bethena Roys, RN Phone Number: 08/10/2022, 3:29 PM   Clinical Narrative: Case Manager spoke with patient and spouse this morning regarding home health services and agencies. Medicare.gov list provided and spouse wanted a facility in network. Case Manager called [facility] and they will be able to service the patient. Start of care to begin within 24-48 hours post transition home. Patient has DME [facility] in the home. No further needs identified at this time.    Final next level of care: [place] Barriers to Discharge: No Barriers Identified   Patient Goals and CMS Choice Patient states their goals for this hospitalization and ongoing recovery are:: to return home. CMS Medicare.gov Compare Post Acute Care list provided to:: Patient Choice offered to / list presented to : Patient, Spouse   Discharge Plan and Services In-house Referral: NA Discharge Planning Services: CM Consult Post Acute Care Choice: Home Health          DME Arranged: N/A        HH Arranged: PT HH Agency: [place] Date [place]: 08/10/22 Time HH Agency Contacted: 1528 Representative spoke with at [place]: [name]  Social Determinants of Health (SDOH) Interventions Food Insecurity Interventions: Intervention Not Indicated Housing Interventions: Intervention Not Indicated Utilities Interventions: Intervention Not Indicated Alcohol Usage Interventions: Intervention Not Indicated (Score <7) Financial Strain Interventions: Intervention Not Indicated   Readmission Risk Interventions    08/08/2022   11:21 AM  Readmission Risk Prevention Plan  Transportation Screening Complete  HRI or Home Care Consult Complete  Social Work Consult for [place] Planning/Counseling  Complete  Palliative Care Screening Not Applicable  Medication Review Press photographer) Referral to Pharmacy

## 2022-08-10 NOTE — Progress Notes (Deleted)
PT Cancellation Note  Patient Details Name: Evan Avery MRN: [id number] DOB: 04-Jul-1946   Cancelled Treatment:    Reason Eval/Treat Not Completed: Patient at procedure or test/unavailable (stress test). Will follow-up for PT treatment as schedule permits.  Mabeline Caras, PT, DPT Acute Rehabilitation Services  Personal: [place] [facility] Office: [place] 08/10/2022, 9:03 AM

## 2022-08-10 NOTE — Plan of Care (Signed)

## 2022-08-10 NOTE — Progress Notes (Signed)
CARDIAC REHAB PHASE I   Pt received in bed, refused ambulation stating he was tired from PT session and very sore in the chest from compressions. Reviewed education with pt that was covered yesterday, pt decided to join CRP2 at [facility]. All questions from pt answered, pt left in bed with call bell in reach.  [place], MS 08/10/2022 3:07 PM

## 2022-08-10 NOTE — Progress Notes (Signed)
Mobility Specialist - Progress Note   08/10/22 1514  Mobility  Activity Ambulated with assistance to bathroom  Level of Assistance Contact guard assist, steadying assist  Assistive Device Front wheel walker  Distance Ambulated (ft) 10 ft  Activity Response Tolerated well  Mobility Referral Yes  $Mobility charge 1 Mobility   Pt received in bed requesting to use BR. No complaints throughout. Pt left in commode and instructed to use call bell when done.   Larey Seat

## 2022-08-10 NOTE — TOC CM/SW Note (Signed)
HF TOC CM contacted [facility] and Vania Rea is a medication on their formulary list, the medication may require auth. Updated HF TOC pharmacist. Jonnie Finner RN3 CCM, Heart Failure TOC CM [phone]

## 2022-08-10 NOTE — TOC Benefit Eligibility Note (Signed)
Patient Teacher, English as a foreign language completed.    The patient is currently admitted and upon discharge could be taking Jardiance '10mg'$ .  The current 30 day co-pay is $128.92.   The patient is insured through [place], [place] Patient Advocate Specialist [place] Patient Advocate Team Direct Number: [phone] Fax: [phone]

## 2022-08-10 NOTE — Progress Notes (Signed)
Heart Failure Stewardship Pharmacist Progress Note   PCP: [name], MD PCP-Cardiologist: Shelva Majestic, MD    HPI:  76 yo M with PMH of CAD s/p BMS to RCA in 1990s, emphysema, PAD, HTN, HLD, obesity, and OSA.  Presented to the ED on 10/15 after out-of-hospital VF arrest. Found to have 99% in-stent re-stenosis of RCA s/p PCI with DES. EF estimated to be 30-35% by LV gram. Formal ECHO 10/16 showed LVEF 45-50% with regional wall motion abnormalities, G2DD, RV mild-moderately reduced. Extubated on 10/16.   Current HF Medications: Diuretic: furosemide 20 mg PO daily ACE/ARB/ARNI: irbesartan 150 mg daily SGLT2i: Jardiance 10 mg daily  Prior to admission HF Medications: ACE/ARB/ARNI: lisinopril 5 mg daily SGLT2i: Jardiance 10 mg daily  Pertinent Lab Values: As of 10/18: Serum creatinine 1.65, BUN 27, Potassium 3.2, Sodium 140, BNP 897.6, Magnesium 2.0, A1c 8.8  Vital Signs: Weight: 235 lbs (admission weight: 202 lbs) Blood pressure: 160/70s  Heart rate: 50-60s  I/O: -1.5L yesterday; net even  Medication Assistance / Insurance Benefits Check: Does the patient have prescription insurance?  Yes Type of insurance plan: [place] + Medicare  Outpatient Pharmacy:  Prior to admission outpatient pharmacy: [facility] Is the patient willing to use [facility] at discharge? Yes Is the patient willing to transition their outpatient pharmacy to utilize a [facility] outpatient pharmacy?   Pending    Assessment: 1. ICM CHF (LVEF 45-50%). NYHA class II symptoms. - Continue furosemide 20 mg PO daily. Check BMET. Keep K>4 and Mag>2. - Holding PTA BB with bradycardia - On irbesartan 150 mg daily, consider transitioning to Entresto 24/26 mg BID since BP remains elevated.  - Consider starting spironolactone 12.5 mg daily - Continue Jardiance 10 mg daily  Plan: 1) Medication changes recommended at this time: - Stop irbesartan and start Entresto 24/26 mg BID  - Start spironolactone 12.5 mg  daily  2) Patient assistance: - Confirmed he has VA benefits - Jardiance and Entresto free through the [place]  3)  Education  - Patient has been educated on current HF medications and potential additions to HF medication regimen - Patient verbalizes understanding that over the next few months, these medication doses may change and more medications may be added to optimize HF regimen - Patient has been educated on basic disease state pathophysiology and goals of therapy   Kerby Nora, PharmD, BCPS Heart Failure Cytogeneticist Phone [phone]

## 2022-08-10 NOTE — Progress Notes (Addendum)
Rounding Note    Patient Name: Evan Avery Date of Encounter: 08/10/2022  [place] Cardiologist: Shelva Majestic, MD    Patient Profile     76 y.o. male with long-standing  CAD (PCI x 4 BMS in RCA - in 1990s), PAD, HTN, HLD, Obesity, & OSA who presented on 08/06/2021 with Ischemic VF Cardiac Arrest with ROSC in setting of ACS/NSTEMI.  Witnessed Cardiac Arrest @ home -> bystander CPR + EMS 6 shocks for VF before ROSC. Intubated due to agitation/Resp Failure with Hypoxia/Hypercapnea).  Assessment & Plan    Cardiac arrest / Ventricular fibrillation-- Had witnessed cardiac arrest at home with bystander CPR, defibrillated x6.  Treated with IV amiodarone, weaned and DC'd Shock- resolved Inferior MI-- Underwent cardiac catheterization noted as above with PCI/DES to RCA.  Placed on DAPT with aspirin/Plavix Continue aspirin, Plavix, statin, ARB (needed transition to [facility] tomorrow if still here, or can convert in OP setting -may be difficult because he is already in the donut hole with his other medications.).   Check BMP today if renal function improving we will add spironolactone Unable to add beta-blocker with baseline bradycardia  ICM -- EF 30 to 35% via LV gram.  Follow-up echocardiogram showed LVEF of 45 to 50%, inferior hypokinesis, grade 2 diastolic dysfunction, mild to moderate RV dysfunction with normal pressures with mild biatrial enlargement. GDMT: On irbesartan with plans to transition to [employer], continue [place].  Plan to add spironolactone pending stable renal function today -- Lasix 20 mg daily  Acute on chronic respiratory failure with hypoxia-- Initially required intubation, status post extubation 10/16 COPD OSA Being treated with antibiotics for possible aspiration pneumonia, to complete 5-day course of Rocephin (last dose 10/20)  AKI -- Creatinine peaked at 2.28, => 1.65 10/18 => 1.59 today (10/19) -- BMP pending at time of initial  evaluation.  Resulted at 1.59.  Stable if not improved.  Diabetes mellitus, type II, with occasions; not on insulin-- Last hemoglobin A1c (05/2022) 8.8 SSI as inpatient. continue Jardiance  Hypokalemia -- K+ 3.2 yesterday -- Repeat BMP pending = > 3.3 today  Dispo: needs to ambulate with PT, likely DC home tomorrow (or potentially later today depending upon how well he does with PT)  =================================================================== Subjective   Feeling pretty good this morning. Chest is still quite sore . Tender Patient says that he feels well enough to potentially go home.  He thinks he can probably get better sleep at home.  Inpatient     Scheduled Meds:  aspirin  81 mg Oral Daily   atorvastatin  80 mg Oral Daily   Chlorhexidine Gluconate Cloth  6 each Topical Q0600   clopidogrel  75 mg Oral Daily   empagliflozin  10 mg Oral Daily   ezetimibe  10 mg Oral Daily   furosemide  20 mg Oral Daily   gabapentin  100 mg Oral TID   heparin  5,000 Units Subcutaneous Q8H   insulin aspart  0-9 Units Subcutaneous TID WC   insulin glargine-yfgn  5 Units Subcutaneous Daily   irbesartan  150 mg Oral Daily   lidocaine  1 patch Transdermal Q24H   mupirocin ointment  1 Application Nasal BID   pantoprazole  40 mg Oral QHS   sodium chloride flush  3 mL Intravenous Q12H   sodium chloride flush  3 mL Intravenous Q12H   Continuous Infusions:  sodium chloride     sodium chloride Stopped (08/07/22 0317)   cefTRIAXone (ROCEPHIN)  IV 2  g (08/09/22 1521)   lactated ringers     PRN Meds: sodium chloride, acetaminophen, docusate sodium, ondansetron (ZOFRAN) IV, mouth rinse, oxyCODONE, phenol, sodium chloride flush   Vital Signs    Vitals:   08/09/22 1558 08/09/22 1935 08/10/22 0435 08/10/22 0800  BP: 136/86 (!) 148/76 (!) 155/82 (!) 166/72  Pulse:  60 69 (!) 122  Resp:  20 (!) 22 16  Temp: 97.7 F (36.5 C) 98 F (36.7 C) 98 F (36.7 C) 98.5 F (36.9 C)  TempSrc: Oral  Oral Oral Oral  SpO2:   94% 91%  Weight:   106.7 kg   Height:       No intake or output data in the 24 hours ending 08/10/22 1011    08/10/2022    4:35 AM 08/09/2022    5:00 AM 08/08/2022    5:00 AM  Last 3 Weights  Weight (lbs) 235 lb 3.2 oz 221 lb 12.5 oz 202 lb 13.2 oz  Weight (kg) 106.686 kg 100.6 kg 92 kg      Telemetry    Sinus bradycardia - Personally Reviewed  ECG   No new tracing   Physical Exam   GEN: No acute distress.   Neck: No JVD Cardiac: RRR, no murmurs, rubs, or gallops.  Respiratory: Clear to auscultation bilaterally. GI: Soft, nontender, non-distended  MS: Mild LE edema; No deformity.;  Significant parasternal/chest tenderness to palpation. Neuro:  Nonfocal  Psych: Normal affect   Cardiac Studies   Cath: 08/06/22: Ischemic VF Cardiac Arrest with ROSC in setting of ACS/NSTEMI mRCA 99% (TIMI 2 flow) just above the previously placed BMS.  Left-to-right collaterals.  Heavily calcified vessel with the proximal segment segmentally 50% narrowed/calcified. PTCA-PCI with overlapping Synergy XD DES 3.5 x 28 postdilated with 3.75 mm 20 mm [place] balloon to high ATM => 99% lesion reduced to 0, but persistent underexpansion roughly 50% in the more proximal segment. Left main, LAD and LCx patent. EF estimated 30 to 35% with EDP 17%.  Severely hypokinetic to akinetic inferior wall (basal to apical)  Diagnostic Dominance: Right    Intervention    Echo: 08/07/22: EF 45 to 50%.  Mildly reduced function.  Inferior hypokinesis.  Mild LV dilation.  GRII DD.  Mild to moderate RV dysfunction with normal pressures.  Mild biatrial dilation.  Mild MAC as well as mild aortic calcification -AOV sclerosis without stenosis  Labs    High Sensitivity Troponin:   Recent Labs  Lab 08/06/22 1030 08/06/22 1730  TROPONINIHS 396* >24,000*     Chemistry Recent Labs  Lab 08/06/22 1050 08/06/22 1601 08/06/22 1730 08/06/22 2355 08/07/22 0509 08/07/22 0520 08/07/22 1723  08/08/22 0423 08/09/22 0507 08/10/22 0147  NA  --    < > 135   < > 138   < > 140 141 140  --   K  --    < > 7.4*   < > 4.5   < > 3.8 3.8 3.2*  --   CL  --   --  109   < > 108  --  107 108 105  --   CO2  --   --  16*   < > 19*  --  23 21* 23  --   GLUCOSE  --   --  295*   < > 182*  --  125* 119* 143*  --   BUN  --   --  32*   < > 36*  --  35* 33* 27*  --  CREATININE  --   --  2.10*   < > 2.21*  --  2.23* 2.01* 1.65*  --   CALCIUM  --   --  7.8*   < > 8.6*  --  8.9 9.0 9.0  --   MG 2.2  --   --   --   --   --   --  1.8  --  2.0  PROT  --   --  5.7*  --  5.1*  --  5.5*  --   --   --   ALBUMIN  --   --  3.4*  --  2.9*  --  3.1*  --   --   --   AST  --   --  237*  --  248*  --  204*  --   --   --   ALT  --   --  85*  --  126*  --  134*  --   --   --   ALKPHOS  --   --  51  --  43  --  42  --   --   --   BILITOT  --   --  0.7  --  0.9  --  0.8  --   --   --   GFRNONAA  --   --  32*   < > 30*  --  30* 34* 43*  --   ANIONGAP  --   --  10   < > 11  --  _0 --    < > = values in this interval not displayed.    Lipids No results for input(s): "CHOL", "TRIG", "HDL", "LABVLDL", "LDLCALC", "CHOLHDL" in the last 168 hours.  Hematology Recent Labs  Lab 08/07/22 0509 08/07/22 0520 08/08/22 0423 08/09/22 0507  WBC 11.0*  --  8.9 7.1  RBC 3.88*  --  3.41* 3.51*  HGB 12.6* 12.2* 11.4* 11.3*  HCT 38.7* 36.0* 34.1* 34.4*  MCV 99.7  --  100.0 98.0  MCH 32.5  --  33.4 32.2  MCHC 32.6  --  33.4 32.8  RDW 14.6  --  14.6 14.3  PLT 124*  --  81* 82*   Thyroid No results for input(s): "TSH", "FREET4" in the last 168 hours.  BNP Recent Labs  Lab 08/06/22 1030 08/07/22 0509  BNP 257.1* 897.6*    DDimer No results for input(s): "DDIMER" in the last 168 hours.   Radiology    No results found.   For questions or updates, please contact [place] Please consult [email or web address] for contact info under        Signed, Reino Bellis, NP  08/10/2022, 10:11 AM    ATTENDING  ATTESTATION  I have seen, examined and evaluated the patient this AM on rounds along with Reino Bellis, NP-C.  After reviewing all the available data and chart, we discussed the patients laboratory, study & physical findings as well as symptoms in detail.  I agree with her findings, examination as well as impression recommendations as per our discussion.    Attending adjustments noted in italics.   He has looks markedly well today.  Feels good besides a chest soreness.  He thinks he just is not sleeping well in the beds here.  He is interested in going home.  He actually said he did pretty well walk around yesterday and is looking forward to working with PT today. Our initial plans were  for him to go home tomorrow with a large plan to titrate medications and have PT evaluate him to see if it needed any home requirements.  However with as well as he is doing and feeling, I think it is reasonable to reassess in the afternoon after PT sees him.  If he does well, he can be discharged with plans to further titrate medications in the outpatient setting.  No active angina or heart failure symptoms.  Blood pressures are still somewhat elevated and we have room to titrate medications.  Labs were checked late this morning, creatinine and potassium level are stable if not improved.  Still mildly reduced potassium, but okay with [place] adding ARB.  Will need outpatient labs checked.    Leonie Man, MD, MS Glenetta Hew, M.D., M.S. Interventional Cardiologist  [facility]  Pager # [phone] Phone # [phone] [address] [place],  [address]

## 2022-08-10 NOTE — Telephone Encounter (Signed)
Pharmacy Patient Advocate Encounter  Insurance verification completed.    The patient is insured through [employer]   The patient is currently admitted and ran test claims for the following: Jardiance '10mg'$ .  Copays and coinsurance results were relayed to Inpatient clinical team.

## 2022-08-10 NOTE — Progress Notes (Signed)
Physical Therapy Treatment Patient Details Name: Evan Avery MRN: [id number] DOB: 04/10/1946 Today's Date: 08/10/2022   History of Present Illness 76 yo male admitted 08/06/22 after cardiac arrest at home s/p CPR and shock x 6 with ROSC after 15 min. 10/15 cath with RCA stent. 10/16 extubated. PMhx: CAD, HLD, DM2, Emphysema, R subclavian artery stenosis, HTN, glaucoma, T2DM    PT Comments    Pt progressing with mobility. Today's session focused on addressing gait endurance, transfers, and bed mobility. Pt vitals stable during 140 ft min guard ambulation and sit to stand transfers. Trial of bed mobility with reports of increased compression site pain. Educated on positioning in bed vs recliner for rest post d/c with extensive time spent conversing with pt and spouse about home management and activity recommendations. Pt remains limited by generalized weakness, decreased activity tolerance, and impaired balance strategies/postural reactions. Continue to recommend acute PT services to maximize functional mobility and independence prior to return home with HHPT.    Recommendations for follow up therapy are one component of a multi-disciplinary discharge planning process, led by the attending physician.  Recommendations may be updated based on patient status, additional functional criteria and insurance authorization.  Follow Up Recommendations  Home health PT     Assistance Recommended at Discharge Intermittent Supervision/Assistance  Patient can return home with the following A little help with walking and/or transfers;A little help with bathing/dressing/bathroom;Assistance with cooking/housework;Assist for transportation;Help with stairs or ramp for entrance;Direct supervision/assist for medications management   Equipment Recommendations  None recommended by PT    Recommendations for Other Services       Precautions / Restrictions Precautions Precautions: Fall Restrictions Weight  Bearing Restrictions: No Other Position/Activity Restrictions: does not tolerate supine well due to post-compression chest pain     Mobility  Bed Mobility Overal bed mobility: Needs Assistance Bed Mobility: Sit to Supine, Supine to Sit     Supine to sit: Max assist Sit to supine: Min assist   General bed mobility comments: Attempt to perform bed mobility to mimic home environment, pt could sit > supine with min A although report of substaintial compression chest pain. Pt requried max A for truncal support to return to sitting upright to reduce pain    Transfers Overall transfer level: Needs assistance Equipment used: Rolling walker (2 wheels) Transfers: Sit to/from Stand Sit to Stand: Min guard, Supervision           General transfer comment: Initial sit to stand min guard with cues for hand placement, completed second trial supervision    Ambulation/Gait Ambulation/Gait assistance: Min guard Gait Distance (Feet): 140 Feet Assistive device: Rolling walker (2 wheels) Gait Pattern/deviations: Trunk flexed, Step-through pattern, Decreased stride length Gait velocity: decreased     General Gait Details: Continued cues for proximity to RW, unsure if due to RW not smoothly rolling or pt unable to maintain attention   Stairs             Wheelchair Mobility    Modified Rankin (Stroke Patients Only)       Balance Overall balance assessment: Needs assistance Sitting-balance support: Feet supported, Single extremity supported, No upper extremity supported Sitting balance-Leahy Scale: Good Sitting balance - Comments: forward lean during static sitting, demonstrates dynamic weight shift with use of 1 hand for support   Standing balance support: Bilateral upper extremity supported, During functional activity Standing balance-Leahy Scale: Poor Standing balance comment: Sustained standing balance supervision with RW  Cognition  Arousal/Alertness: Awake/alert Behavior During Therapy: WFL for tasks assessed/performed Overall Cognitive Status: Impaired/Different from baseline Area of Impairment: Safety/judgement                         Safety/Judgement: Decreased awareness of safety, Decreased awareness of deficits     General Comments: Spouse present and indicates feisty personality is baseline, but didnt clarify about apparent cognitive deficits; pt able to dual task with conversation during mobility, and could follow multi step commands with increased time. Suspect near baseline cognition.        Exercises      General Comments General comments (skin integrity, edema, etc.): Pt eager to go home with mild lack of awareness of deficits post cardiac arrest. VSS during ambulation.      Pertinent Vitals/Pain Pain Assessment Pain Score: 7  Pain Location: chest from compressions Pain Descriptors / Indicators: Aching, Moaning Pain Intervention(s): Monitored during session, Repositioned    Home Living                          Prior Function            PT Goals (current goals can now be found in the care plan section) Acute Rehab PT Goals Patient Stated Goal: return home PT Goal Formulation: With patient/family Time For Goal Achievement: 08/22/22 Potential to Achieve Goals: Good Progress towards PT goals: Progressing toward goals    Frequency    Min 3X/week      PT Plan Current plan remains appropriate    Co-evaluation              AM-PAC PT "6 Clicks" Mobility   Outcome Measure  Help needed turning from your back to your side while in a flat bed without using bedrails?: A Little Help needed moving from lying on your back to sitting on the side of a flat bed without using bedrails?: A Lot Help needed moving to and from a bed to a chair (including a wheelchair)?: A Little Help needed standing up from a chair using your arms (e.g., wheelchair or bedside chair)?: A  Little Help needed to walk in hospital room?: A Little Help needed climbing 3-5 steps with a railing? : A Little 6 Click Score: 17    End of Session Equipment Utilized During Treatment: Gait belt Activity Tolerance: Patient tolerated treatment well Patient left: in chair;with call bell/phone within reach;with family/visitor present Nurse Communication: Mobility status PT Visit Diagnosis: Other abnormalities of gait and mobility (R26.89)     Time: 5035-4656 PT Time Calculation (min) (ACUTE ONLY): 35 min  Charges:  $Therapeutic Activity: 8-22 mins $Self Care/Home Management: 8-22                     Chipper Oman, SPT    [place] Imri Lor 08/10/2022, 1:46 PM

## 2022-08-11 ENCOUNTER — Telehealth: Payer: Self-pay | Admitting: Cardiovascular Disease

## 2022-08-11 MED ORDER — ASPIRIN 81 MG PO TBEC: 81.0000 mg | DELAYED_RELEASE_TABLET | Freq: Every day | ORAL | 3 refills | Status: DC

## 2022-08-11 MED ORDER — EZETIMIBE 10 MG PO TABS: 10.0000 mg | ORAL_TABLET | Freq: Every day | ORAL | 3 refills | Status: DC

## 2022-08-11 MED ORDER — EMPAGLIFLOZIN 10 MG PO TABS: 10.0000 mg | ORAL_TABLET | Freq: Every day | ORAL | 3 refills | Status: DC

## 2022-08-11 MED ORDER — NITROGLYCERIN 0.4 MG SL SUBL: SUBLINGUAL_TABLET | SUBLINGUAL | 3 refills | Status: DC

## 2022-08-11 NOTE — Telephone Encounter (Signed)
Mailbox is full and cannot leave a message.

## 2022-08-11 NOTE — Telephone Encounter (Signed)
Spoke with spouse regarding medications. Provided samples of Jardiance '10mg'$ , which she will pick up on Monday. Prescriptions sent to preferred pharmacy for Jardiance '10mg'$  daily, Zetia '10mg'$  daily, ASA '81mg'$  daily, and NTG 0.'4mg'$  prn.

## 2022-08-11 NOTE — Telephone Encounter (Signed)
Pt c/o medication issue:  1. Name of Medication: empagliflozin (JARDIANCE) 10 MG TABS tablet  2. How are you currently taking this medication (dosage and times per day)?   3. Are you having a reaction (difficulty breathing--STAT)?   4. What is your medication issue? Pt's wife is requesting calling to see about getting financial assistance for this medication and to also discuss other medications she states they wanted her husband to start taking from hospital, but states they do not have a prescription for.

## 2022-08-11 NOTE — Addendum Note (Signed)
Addended by: Betha Loa F on: 08/11/2022 04:48 PM   Modules accepted: Orders

## 2022-08-12 LAB — CULTURE, BLOOD (ROUTINE X 2)
Culture: NO GROWTH
Culture: NO GROWTH
Special Requests: ADEQUATE
Special Requests: ADEQUATE

## 2022-08-14 ENCOUNTER — Telehealth: Payer: Self-pay | Admitting: Cardiovascular Disease

## 2022-08-14 ENCOUNTER — Telehealth: Payer: Self-pay | Admitting: Family Medicine

## 2022-08-14 NOTE — Telephone Encounter (Signed)
Agree with these verbal orders. Thanks.

## 2022-08-14 NOTE — Telephone Encounter (Signed)
Spoke to [place] with [facility].Stated today was her visit with patient.She was calling to report elevated B/P 164/80, 170/80.Pulse 72.Stated patient looked good.No complaints.Advised I will send message to [facility] for advice.

## 2022-08-14 NOTE — Telephone Encounter (Signed)
No, medication does not need to be refilled

## 2022-08-14 NOTE — Telephone Encounter (Signed)
Home Health verbal orders [place] Agency Name: [facility]  Callback number: [phone]  Requesting OT/PT/Skilled nursing/Social Work/Speech:  Reason:PT  Frequency: 2 W x 5 W , 1 W X 4 W  Please forward to [employer] or providers CMA

## 2022-08-14 NOTE — Telephone Encounter (Signed)
Called patient wife back, advised no need to refill.   Patient wife verbalized understanding.

## 2022-08-14 NOTE — Telephone Encounter (Signed)
Pt c/o medication issue:  1. Name of Medication: cefUROXime (CEFTIN) 500 MG tablet  2. How are you currently taking this medication (dosage and times per day)? Take 1 tablet (500 mg total) by mouth 2 (two) times daily with a meal.  3. Are you having a reaction (difficulty breathing--STAT)? no  4. What is your medication issue? Wife calling to see if the patient is suppose to be taking this medication. If they are suppose to be still taking it, they need a refill sent in

## 2022-08-14 NOTE — Telephone Encounter (Signed)
Evan Avery, Evan Avery routed conversation to You 32 minutes ago (4:59 PM)   Social research officer, government, [name], NT routed conversation to [place], [place] 1 hour ago (4:15 PM)   [place], [name], NT 1 hour ago (4:15 PM)    Home Health verbal orders [place] Agency Name: [place] number: [phone]   Requesting OT/PT/Skilled nursing/Social Work/Speech:   Reason:PT   Frequency: 2 W x 5 W , 1 W X 4 W   Please forward to [employer] or providers CMA

## 2022-08-14 NOTE — Telephone Encounter (Signed)
Pt c/o BP issue: STAT if pt c/o blurred vision, one-sided weakness or slurred speech  1. What are your last 5 BP readings?   At rest 160-164/80 With 96PR walk, his systolic is going up over 170  2. Are you having any other symptoms (ex. Dizziness, headache, blurred vision, passed out)?   No  3. What is your BP issue?   Caller stated patient is asymptomatic and all his other vital signs are good.  Caller just wanted to report patient is having high BP.  Caller stated can contact patient directly.

## 2022-08-15 NOTE — Telephone Encounter (Signed)
Spoke with Anda Kraft, of [facility], informing her Dr. Darnell Level is giving verbal orders for PT requested.

## 2022-08-16 ENCOUNTER — Other Ambulatory Visit: Payer: Self-pay

## 2022-08-16 DIAGNOSIS — Z79899 Other long term (current) drug therapy: Secondary | ICD-10-CM

## 2022-08-17 ENCOUNTER — Telehealth: Payer: Self-pay

## 2022-08-17 LAB — BASIC METABOLIC PANEL
BUN/Creatinine Ratio: 15 (ref 10–24)
BUN: 16 mg/dL (ref 8–27)
CO2: 25 mmol/L (ref 20–29)
Calcium: 9.4 mg/dL (ref 8.6–10.2)
Chloride: 101 mmol/L (ref 96–106)
Creatinine, Ser: 1.09 mg/dL (ref 0.76–1.27)
Glucose: 131 mg/dL — ABNORMAL HIGH (ref 70–99)
Potassium: 3.9 mmol/L (ref 3.5–5.2)
Sodium: 142 mmol/L (ref 134–144)
eGFR: 70 mL/min/{1.73_m2} (ref >59)

## 2022-08-17 NOTE — Chronic Care Management (AMB) (Signed)
Chronic Care Management Pharmacy Assistant   Name: Evan Avery  MRN: [id number] DOB: [birth year]  Reason for Encounter: Reminder Call   Hospital visits:   Hospital Follow Up  Medication Reconciliation was completed by comparing discharge summary, patient's EMR and Pharmacy list, and upon discussion with patient.  Admitted to the hospital on 08/06/22 due to Cardiac Arrest . Discharge date was 08/10/22. Discharged from [place]?Medications Started at [facility] Discharge:?? -started cefuroxime  Furosemide  Entresto   Medication Changes at Hospital Discharge: -Changed atorvastatin   Medications Discontinued at Hospital Discharge: -Stopped atenolol   Isosorbide  Lisinopril  Actos   Brief summary of hospital course: Had witnessed cardiac arrest at home with bystander CPR, defibrillated x6.  Treated with IV amiodarone, weaned and DC'd Underwent cardiac catheterization noted as above with PCI/DES to RCA.  Placed on DAPT with aspirin/Plavix -- Continue aspirin, Plavix, statin, transitioned to Entresto at discharge -- Unable to add beta-blocker with baseline bradycardia -- plan for addition of spiro as an outpatient  Initially required intubation, status post extubation 10/16  -- Being treated with antibiotics for possible aspiration pneumonia, to complete 5-day course of Rocephin -- given single day dose of ceftin at discharge to complete course -- continue Jardiance, resumed on metformin at discharge   Medications: Outpatient Encounter Medications as of 08/17/2022  Medication Sig   aspirin EC 81 MG tablet Take 1 tablet (81 mg total) by mouth daily. Swallow whole.   atorvastatin (LIPITOR) 80 MG tablet Take 1 tablet (80 mg total) by mouth daily.   Brinzolamide-Brimonidine 1-0.2 % SUSP Apply 1 drop to eye 3 (three) times daily.    cefUROXime (CEFTIN) 500 MG tablet Take 1 tablet (500 mg total) by mouth 2 (two) times daily with a meal.   cetirizine  (ZYRTEC) 10 MG tablet Take 10 mg by mouth daily as needed for allergies.   clopidogrel (PLAVIX) 75 MG tablet Take 1 tablet by mouth once daily   Coenzyme Q10 (COQ10) 200 MG CAPS Take 200 mg by mouth daily.   empagliflozin (JARDIANCE) 10 MG TABS tablet Take 1 tablet (10 mg total) by mouth daily before breakfast.   ezetimibe (ZETIA) 10 MG tablet Take 1 tablet (10 mg total) by mouth daily.   famotidine (PEPCID) 40 MG tablet Take 1 tablet (40 mg total) by mouth 2 (two) times daily.   fluticasone (FLONASE) 50 MCG/ACT nasal spray Place 2 sprays into both nostrils daily.   furosemide (LASIX) 20 MG tablet Take 1 tablet (20 mg total) by mouth daily as needed for fluid or edema (for weight gain of 3 pounds or more in one day or for shortness of breath).   gabapentin (NEURONTIN) 100 MG capsule Take 100 mg by mouth 3 (three) times daily.   glimepiride (AMARYL) 2 MG tablet Take 1 tablet (2 mg total) by mouth daily before breakfast.   latanoprost (XALATAN) 0.005 % ophthalmic solution Place 1 drop into both eyes at bedtime.   metFORMIN (GLUCOPHAGE) 1000 MG tablet TAKE 1 TABLET BY MOUTH TWICE DAILY WITH MEALS (Patient taking differently: Take 1,000 mg by mouth 2 (two) times daily with a meal.)   nitroGLYCERIN (NITROSTAT) 0.4 MG SL tablet For chest pain, tightness, or pressure. While sitting, place 1 tablet under tongue. May be used every 5 minutes as needed, for up to 15 minutes. Do not use more than 3 tablets.   omega-3 acid ethyl esters (LOVAZA) 1 g capsule Take 2 capsules by mouth  twice daily (Patient taking differently: Take 2 g by mouth 2 (two) times daily.)   sacubitril-valsartan (ENTRESTO) 24-26 MG Take 1 tablet by mouth 2 (two) times daily.   senna-docusate (SENOKOT-S) 8.6-50 MG tablet Take 1 tablet by mouth at bedtime as needed for mild constipation.   TURMERIC CURCUMIN PO Take 1 capsule by mouth daily.   No facility-administered encounter medications on file as of 08/17/2022.   Evan Avery was  contacted to remind of upcoming telephone visit with Evan Avery on 08/23/22 at 1:30. Patient was reminded to have any blood glucose and blood pressure readings available for review at appointment.   Message was left reminding patient of appointment.  No voice mail set up   CCM referral has been placed prior to visit?  Yes   Star Rating Drugs: Medication:  Last Fill: Day Supply Atorvastatin 80 08/10/22 90 Jardiance '10mg'$  06/29/22  30 Glimepiride '2mg'$  06/06/22 90 Metformin '1000mg'$  07/04/22 90 Entresto 24-'26mg'$  08/10/22 [place], CPP notified  [name], [place]  [phone]

## 2022-08-23 ENCOUNTER — Ambulatory Visit (HOSPITAL_COMMUNITY)
Admit: 2022-08-23 | Discharge: 2022-08-23 | Disposition: A | Discharge status: Home / Self Care | Payer: HMO | Attending: Adult Health | Admitting: Adult Health

## 2022-08-23 ENCOUNTER — Ambulatory Visit: Payer: No Typology Code available for payment source | Admitting: Pharmacist

## 2022-08-23 VITALS — BP 170/80 | HR 65 | Wt 228.8 lb

## 2022-08-23 DIAGNOSIS — E785 Hyperlipidemia, unspecified: Secondary | ICD-10-CM | POA: Diagnosis not present

## 2022-08-23 DIAGNOSIS — Z79899 Other long term (current) drug therapy: Secondary | ICD-10-CM | POA: Diagnosis not present

## 2022-08-23 DIAGNOSIS — E1169 Type 2 diabetes mellitus with other specified complication: Secondary | ICD-10-CM

## 2022-08-23 DIAGNOSIS — Z7984 Long term (current) use of oral hypoglycemic drugs: Secondary | ICD-10-CM | POA: Diagnosis not present

## 2022-08-23 DIAGNOSIS — Z8674 Personal history of sudden cardiac arrest: Secondary | ICD-10-CM | POA: Diagnosis not present

## 2022-08-23 DIAGNOSIS — I252 Old myocardial infarction: Secondary | ICD-10-CM | POA: Insufficient documentation

## 2022-08-23 DIAGNOSIS — I2510 Atherosclerotic heart disease of native coronary artery without angina pectoris: Secondary | ICD-10-CM | POA: Diagnosis not present

## 2022-08-23 DIAGNOSIS — Z7902 Long term (current) use of antithrombotics/antiplatelets: Secondary | ICD-10-CM | POA: Insufficient documentation

## 2022-08-23 DIAGNOSIS — Z955 Presence of coronary angioplasty implant and graft: Secondary | ICD-10-CM | POA: Diagnosis not present

## 2022-08-23 DIAGNOSIS — I5022 Chronic systolic (congestive) heart failure: Secondary | ICD-10-CM | POA: Diagnosis not present

## 2022-08-23 DIAGNOSIS — G4733 Obstructive sleep apnea (adult) (pediatric): Secondary | ICD-10-CM | POA: Insufficient documentation

## 2022-08-23 DIAGNOSIS — I255 Ischemic cardiomyopathy: Secondary | ICD-10-CM

## 2022-08-23 DIAGNOSIS — F17200 Nicotine dependence, unspecified, uncomplicated: Secondary | ICD-10-CM

## 2022-08-23 DIAGNOSIS — E119 Type 2 diabetes mellitus without complications: Secondary | ICD-10-CM | POA: Diagnosis not present

## 2022-08-23 DIAGNOSIS — I10 Essential (primary) hypertension: Secondary | ICD-10-CM

## 2022-08-23 DIAGNOSIS — J449 Chronic obstructive pulmonary disease, unspecified: Secondary | ICD-10-CM | POA: Insufficient documentation

## 2022-08-23 DIAGNOSIS — E669 Obesity, unspecified: Secondary | ICD-10-CM | POA: Insufficient documentation

## 2022-08-23 DIAGNOSIS — I110 Hypertensive heart disease with heart failure: Secondary | ICD-10-CM | POA: Diagnosis not present

## 2022-08-23 LAB — BASIC METABOLIC PANEL
Anion gap: 9 (ref 5–15)
BUN: 13 mg/dL (ref 8–23)
CO2: 28 mmol/L (ref 22–32)
Calcium: 9.4 mg/dL (ref 8.9–10.3)
Chloride: 105 mmol/L (ref 98–111)
Creatinine, Ser: 1.07 mg/dL (ref 0.61–1.24)
GFR, Estimated: >60 mL/min (ref >60)
Glucose, Bld: 145 mg/dL — ABNORMAL HIGH (ref 70–99)
Potassium: 3.9 mmol/L (ref 3.5–5.1)
Sodium: 142 mmol/L (ref 135–145)

## 2022-08-23 LAB — CBC
HCT: 39.1 % (ref 39.0–52.0)
Hemoglobin: 12.8 g/dL — ABNORMAL LOW (ref 13.0–17.0)
MCH: 32.7 pg (ref 26.0–34.0)
MCHC: 32.7 g/dL (ref 30.0–36.0)
MCV: 99.7 fL (ref 80.0–100.0)
Platelets: 228 10*3/uL (ref 150–400)
RBC: 3.92 MIL/uL — ABNORMAL LOW (ref 4.22–5.81)
RDW: 14.5 % (ref 11.5–15.5)
WBC: 5.9 10*3/uL (ref 4.0–10.5)
nRBC: 0 % (ref 0.0–0.2)

## 2022-08-23 LAB — BRAIN NATRIURETIC PEPTIDE: B Natriuretic Peptide: 132 pg/mL — ABNORMAL HIGH (ref 0.0–100.0)

## 2022-08-23 MED ORDER — ENTRESTO 49-51 MG PO TABS: 1.0000 | ORAL_TABLET | Freq: Two times a day (BID) | ORAL | 11 refills | Status: DC

## 2022-08-23 MED ORDER — EMPAGLIFLOZIN 10 MG PO TABS: 10.0000 mg | ORAL_TABLET | Freq: Every day | ORAL | 3 refills | Status: DC

## 2022-08-23 MED ORDER — FUROSEMIDE 20 MG PO TABS: 40.0000 mg | ORAL_TABLET | Freq: Every day | ORAL | 11 refills | Status: DC | PRN

## 2022-08-23 NOTE — Patient Instructions (Signed)
INCREASE Entresto 49/51 Twice daily  CHANGE Lasix to 40 mg daily.  Labs done today, your results will be available in MyChart, we will contact you for abnormal readings.  Thank you for allowing [place] to provider your heart failure care after your recent hospitalization. Please follow-up with [place] in a week.   If you have any questions, issues, or concerns before your next appointment please call our office at [phone], opt. 2 and leave a message for the triage nurse.

## 2022-08-23 NOTE — Addendum Note (Signed)
Encounter addended by: Conrad Quincy, NP on: 08/23/2022 4:38 PM  Actions taken: Follow-up modified, Level of Service modified, Visit diagnoses modified

## 2022-08-23 NOTE — Progress Notes (Signed)
HEART & VASCULAR TRANSITION OF CARE CONSULT NOTE     Referring Physician: Dr Ellyn Hack  Primary Care: Dr Danise Mina  Primary Cardiologist: Dr [name]   HPI: Referred to clinic by Dr Ellyn Hack  for heart failure consultation.   Mr Staubs is a 76 year old with a history of R subclavian  stenosis, OSA, DMII, hyperlipidemia,   CAD, DMII, obesity,  and COPD.   Admitted 08/06/22 with cardiac arrest /VF. Bystander performed CPR with 6 shocks to ROSC. ACS/NTEMI . Urgent cath with PCI/DES to RCA otherwise LM, LAD, circumflex patent.  Placed on aspirin and plavix. Unable to place on bb due to bradycardia. Discharged 08/10/22   Presents with his wife. Overall feeling ok but having chest soreness from CPR. Limited by knee pain. Using rolling walker in the house. Sleeping in a recliner due to chest soreness. Denies SOB/PND/Orthopnea. Appetite ok. No fever or chills. Weight at home 225-230 pounds. Took 20 mg lasix 08/21/22 for leg edema. Says he had a lot of urine output. Taking all medications   Cardiac Testing  08/07/22 Echo EF 45-50%  Grade II DD, basal -to md  inferior LV hypokinetic.  08/06/22 Cath  mRCA 99% (TIMI 2 flow) just above the previously placed BMS.  Left-to-right collaterals.  Heavily calcified vessel with the proximal segment segmentally 50% narrowed/calcified. PTCA-PCI with overlapping Synergy XD DES 3.5 x 28 postdilated with 3.75 mm 20 mm [place] balloon to high ATM => 99% lesion reduced to 0, but persistent underexpansion roughly 50% in the more proximal segment. Left main, LAD and LCx patent. EF estimated 30 to 35% with EDP 17%.  Severely hypokinetic to akinetic inferior wall (basal to apical) Review of Systems: [y] = yes, _0  = no   General: Weight gain _1 ; Weight loss _2 ; Anorexia _3 ; Fatigue [ Y]; Fever _4 ; Chills _5 ; Weakness _6   Cardiac: Chest pain/pressure _7 ; Resting SOB _8 ; Exertional SOB _9 ; Orthopnea _10 ; Pedal Edema _11 ; Palpitations _12 ; Syncope _13 ; Presyncope [  ]; Paroxysmal nocturnal dyspnea_14   Pulmonary: Cough _15 ; Wheezing_16 ; Hemoptysis_17 ; Sputum _18 ; Snoring _19   GI: Vomiting_20 ; Dysphagia_21 ; Melena_22 ; Hematochezia _23 ; Heartburn_24 ; Abdominal pain _25 ; Constipation _26 ; Diarrhea _27 ; BRBPR _28   GU: Hematuria_29 ; Dysuria _30 ; Nocturia_31   Vascular: Pain in legs with walking _32 ; Pain in feet with lying flat _33 ; Non-healing sores _34 ; Stroke _35 ; TIA _36 ; Slurred speech _37 ;  Neuro: Headaches_38 ; Vertigo_39 ; Seizures_40 ; Paresthesias_41 ;Blurred vision _42 ; Diplopia _43 ; Vision changes _44   Ortho/Skin: Arthritis _45 ; Joint pain [ Y]; Muscle pain _46 ; Joint swelling _47 ; Back Pain [Y ]; Rash _48   Psych: Depression_49 ; Anxiety_50   Heme: Bleeding problems _51 ; Clotting disorders _52 ; Anemia _53   Endocrine: Diabetes [ Y]; Thyroid dysfunction_54    Past Medical History:  Diagnosis Date   Agent orange exposure 1968   Arthritis    CAD (coronary artery disease)    4 stents in right artery   Complex tear of medial meniscus of left knee as current injury 07/26/2012   [place]   Diabetes mellitus    type 2   Diverticulitis of colon with perforation 05/23/2016   DVT (deep venous thrombosis) (HCC)    GERD (gastroesophageal reflux disease)    Glaucoma 2006   [facility]   Hx of colonic polyps    Hyperlipidemia    Hypertension    Intra-abdominal abscess ([place]) 05/16/2016   LUMBAR RADICULOPATHY 07/18/2007   Qualifier: Diagnosis of  By: Arnoldo Morale MD, Balinda Quails    Myocardial infarction [facility])    1992   Sleep apnea    uses CPAP regularly    Current Outpatient Medications  Medication Sig Dispense Refill   aspirin EC 81 MG tablet Take 1 tablet (81 mg total) by mouth daily. Swallow whole. 90 tablet 3   atorvastatin (LIPITOR) 80 MG tablet Take 1 tablet (80 mg total) by mouth daily. 90 tablet 3   Brinzolamide-Brimonidine 1-0.2 % SUSP Apply 1 drop to eye 3 (three) times daily.      cetirizine (ZYRTEC) 10 MG tablet Take 10 mg by mouth daily as  needed for allergies.     clopidogrel (PLAVIX) 75 MG tablet Take 1 tablet by mouth once daily 90 tablet 1   Coenzyme Q10 (COQ10) 200 MG CAPS Take 200 mg by mouth daily.     empagliflozin (JARDIANCE) 10 MG TABS tablet Take 1 tablet (10 mg total) by mouth daily before breakfast. 90 tablet 3   ezetimibe (ZETIA) 10 MG tablet Take 1 tablet (10 mg total) by mouth daily. 90 tablet 3   fluticasone (FLONASE) 50 MCG/ACT nasal spray Place 2 sprays into both nostrils daily. 16 g 1   furosemide (LASIX) 20 MG tablet Take 1 tablet (20 mg total) by mouth daily as needed for fluid or edema (for weight gain of 3 pounds or more in one day or for shortness of breath). 30 tablet 11   gabapentin (NEURONTIN) 100 MG capsule Take 100 mg by mouth 3 (three) times daily.     glimepiride (AMARYL) 2 MG tablet Take 1 tablet (2 mg total) by mouth daily before breakfast. 90 tablet 3   latanoprost (XALATAN) 0.005 % ophthalmic solution Place 1 drop into both eyes at bedtime.     metFORMIN (GLUCOPHAGE) 1000 MG tablet TAKE 1 TABLET BY MOUTH TWICE DAILY WITH MEALS (Patient taking differently: Take 1,000 mg by mouth 2 (two) times daily with a meal.) 180 tablet 0   nitroGLYCERIN (NITROSTAT) 0.4 MG SL tablet For chest pain, tightness, or pressure. While sitting, place 1 tablet under tongue. May be used every 5 minutes as needed, for up to 15 minutes. Do not use more than 3 tablets. 25 tablet 3   omega-3 acid ethyl esters (LOVAZA) 1 g capsule Take 2 capsules by mouth twice daily (Patient taking differently: Take 2 g by mouth 2 (two) times daily.) 360 capsule 0   sacubitril-valsartan (ENTRESTO) 24-26 MG Take 1 tablet by mouth 2 (two) times daily. 60 tablet 11   senna-docusate (SENOKOT-S) 8.6-50 MG tablet Take 1 tablet by mouth at bedtime as needed for mild constipation. 15 tablet 0   TURMERIC CURCUMIN PO Take 1 capsule by mouth daily.     No current facility-administered medications for this encounter.    Allergies  Allergen Reactions    Dilaudid [Hydromorphone Hcl] Itching   Ambien [Zolpidem] Other (See Comments)    Pt states "makes me wild"   Niacin And Related Other (See Comments)    Extreme fatigue      Social History   Socioeconomic History   Marital status: Married    Spouse name: Enid Derry   Number of children: 2   Years of education: Not on file   Highest education level: GED or equivalent  Occupational  History   Occupation: retired  Tobacco Use   Smoking status: Every Day    Packs/day: 1.00    Years: 50.00    Total pack years: 50.00    Types: Cigarettes    Start date: 10/23/1958   Smokeless tobacco: Never  Vaping Use   Vaping Use: Never used  Substance and Sexual Activity   Alcohol use: Yes    Alcohol/week: 0.0 standard drinks of alcohol    Comment: rarely-1 drink every 2-3 months   Drug use: No   Sexual activity: Yes  Other Topics Concern   Not on file  Social History Narrative   Lives with wife and son (with mental issues)   Occupation: retired, was Multimedia programmer for [employer]   Activity: no regular exercise   Diet: good water, fruits/vegetables daily   Social Determinants of Health   Financial Resource Strain: Medium Risk (08/09/2022)   Overall Financial Resource Strain (CARDIA)    Difficulty of Paying Living Expenses: Somewhat hard  Food Insecurity: No Food Insecurity (08/09/2022)   Hunger Vital Sign    Worried About Running Out of Food in the Last Year: Never true    [place] in the Last Year: Never true  Transportation Needs: No Transportation Needs (02/24/2022)   PRAPARE - Hydrologist (Medical): No    Lack of Transportation (Non-Medical): No  Physical Activity: Inactive (01/02/2020)   Exercise Vital Sign    Days of Exercise per Week: 0 days    Minutes of Exercise per Session: 0 min  Stress: No Stress Concern Present (01/02/2020)   [place]    Feeling of Stress : Not at all   Social Connections: Not on file  Intimate Partner Violence: Not At Risk (01/02/2020)   Humiliation, Afraid, Rape, and Kick questionnaire    Fear of Current or Ex-Partner: No    Emotionally Abused: No    Physically Abused: No    Sexually Abused: No      Family History  Problem Relation Age of Onset   Alzheimer's disease Mother    Heart disease Father    Heart attack Brother    Diabetes Brother    Heart disease Brother    Heart disease Maternal Grandmother    Colon cancer Maternal Grandfather    Diabetes Paternal Grandfather    Diabetes Son    Esophageal cancer Neg Hx    Rectal cancer Neg Hx    Stomach cancer Neg Hx     Vitals:   08/23/22 0946  BP: (!) 170/80  Pulse: 65  SpO2: 97%  Weight: 103.8 kg (228 lb 12.8 oz)   Wt Readings from Last 3 Encounters:  08/23/22 103.8 kg (228 lb 12.8 oz)  08/10/22 106.7 kg (235 lb 3.2 oz)  06/06/22 103.5 kg (228 lb 2 oz)     PHYSICAL EXAM: General:  Walked in the clinci.  No respiratory difficulty HEENT: normal Neck: supple. JVP 9-10 . Carotids 2+ bilat; no bruits. No lymphadenopathy or thryomegaly appreciated. Cor: PMI nondisplaced. Regular rate & rhythm. No rubs, gallops or murmurs. Lungs: clear Abdomen: soft, nontender, nondistended. No hepatosplenomegaly. No bruits or masses. Good bowel sounds. Extremities: no cyanosis, clubbing, rash, R and LLE 2+ edema Neuro: alert & oriented x 3, cranial nerves grossly intact. moves all 4 extremities w/o difficulty. Affect pleasant.  ECG: SR 64 bpm personally checked   ASSESSMENT & PLAN: 1. HFmEF, ICM 07/2022 Echo EF 40-45%,  NYHA II GDMT  Diuretic-Volume status elevated. Change lasix to 40 mg daily. Discussed low salt food choices and limiting fluid intake to < 2 liters daily.  BB-No bradycardia. Consider next visit.  Ace/ARB/ARNI- Increase entresto 49-51 mg twice a day.  MRA- Consider next visit.  SGLT2i- Continue jardiance 10 mg daily.    2. S/P VF Arrest 2/2 inferior MI-->  CAD Cath 07/2022 DES to RCA  - no chest pain.  -On asa, high intensity statin, and plavix.   3. HLD On high intensity statin.    Referred to HFSW (PCP, Medications, Transportation, ETOH Abuse, Drug Abuse, Insurance, Financial ): No Refer to Pharmacy:  No Refer to Home Health: Followed  Refer to Advanced Heart Failure Clinic:  no  Refer to General Cardiology:He is Dr Ermalinda Memos patient and has follow up.  Follow up next week to reassess volume status. Check CBC and BMET today.   Gene Glazebrook NP-C  4:36 PM

## 2022-08-23 NOTE — Progress Notes (Signed)
Medication Samples have been provided to the patient.  Drug name: Evan Avery       Strength: 49/'51mg'$         Qty: 2  LOT: [id number]  Exp.Date: 8/25  Dosing instructions: Take 1 tablet twice daily  The patient has been instructed regarding the correct time, dose, and frequency of taking this medication, including desired effects and most common side effects.   Evan Avery 10:36 AM 11/1/2023mples

## 2022-08-23 NOTE — Patient Instructions (Signed)
Visit Information  Phone number for Pharmacist: [phone]   Goals Addressed   None     Care Plan : [place]  Updates made by Charlton Haws, RPH since 08/23/2022 12:00 AM     Problem: Hypertension, Hyperlipidemia, Diabetes, Coronary Artery Disease, and Tobacco use   Priority: High     Long-Range Goal: Disease Management   Start Date: 09/05/2021  Expected End Date: 02/25/2023  This Visit's Progress: On track  Recent Progress: On track  Priority: High  Note:   Current Barriers:  Several medication changes following cardiac arrrest  Pharmacist Clinical Goal(s):  Patient will adhere to prescribed medication regimen as evidenced by pt report contact provider office for questions/concerns as evidenced notation of same in electronic health record through collaboration with PharmD and provider.   Pharmacist Clinical Goal(s):  Patient will contact provider office for questions/concerns as evidenced notation of same in electronic health record through collaboration with PharmD and provider.  Interventions: 1:1 collaboration with [name], MD regarding development and update of comprehensive plan of care as evidenced by provider attestation and co-signature Inter-disciplinary care team collaboration (see longitudinal plan of care) Comprehensive medication review performed; medication list updated in electronic medical record  Hypertension / Heart failure (BP goal <130/80) -Query controlled - pt saw Heart clinic today and Entresto and lasix doses were increased, they are following up in 1 week with clinic; pt wife reports he has been retaining fluid since discharge -Current home readings: checking daily, at goal per wife -Daily weights: 224-225# dry weight -Last ejection fraction: 45-50% (Date: 08/07/22) -HF type: HFimpEF (EF improved from <40% to > 40%) - ischemic cardiomyopathy, Grade II diastolic dysfunction -NYHA Class: II (slight limitation of  activity) -Aggravating factors: Hx OSA (wears CPAP); smokes 1 PPD, coffee 2-3 cups daily -Current treatment: Furosemide 20 mg - 2 tab daily PRN - Appropriate, Query Effective Entresto 49/51 mg BID ([facility]) -Appropriate, Query Effective Jardiance 10 mg daily ([facility]) - Appropriate, Query Effective -Medications previously tried: atenolol, irbesartan, lisinopril, isosorbide -Educated on BP goals and benefits of medications for prevention of heart attack, stroke and kidney damage; -Reviewed medication purposes; advised to take furosemide 2 tablets at once rather than BID -Counseled to monitor BP at home daily -Recommended to continue current medication  Hyperlipidemia / CAD (LDL goal < 55) -Controlled - LDL 20 (05/2022) at goal; pt affirms compliance with aspirin (added at discharge) -Hx CAD (MI s/p 4 stents); Tie exception for Lovaza denied 01/2022 - cost $90/3 months -Cardiac arrest 08/06/22 w/ PCI/DES. -Current treatment: Ezetimibe 10 mg daily -Appropriate, Effective, Safe, Accessible Atorvastatin 80 mg daily -Appropriate, Effective, Safe, Accessible Clopidogrel 75 mg daily -Appropriate, Effective, Safe, Accessible Aspirin EC 81 mg daily - Appropriate, Effective, Safe, Accessible Lovaza 1 g - 2 capsules BID  - Appropriate, Effective, Safe, Accessible Nitroglycerin 0.4 mg SL prn -Appropriate, Effective, Safe, Accessible -Medications previously tried: Lovaza, isosorbide [place] -Educated on Cholesterol goals;  -Recommend to continue current medication  Diabetes (A1c goal <7%) -Uncontrolled - A1c 8.8% (05/2022) -Current home glucose readings: none available -Current medications: Metformin 1000 mg BID - Appropriate, Query Effective Glimepiride 2 mg daily AM - Appropriate, Query Effective Jardiance 10 mg daily -Appropriate, Query Effective -Medications previously tried: Ozempic, Januvia (cost), glimepiride, actos -Educated on A1c and blood sugar goals; Carbohydrate counting and/or plate method;  Discussed limiting portions and sweets. -Recommended to continue current medication  Tobacco use (Goal cessation) -Not ideally controlled - pt is smoking 1 PPD; he would like to  be able to quit, he has nicotine patches in his cabinet -Previous quit attempts: 1990s - quit for 2 years cold [place] -Reviewed benefits of smoking cessation (improved BP, DM, lower risk for lung cancer/COPD) -Discussed Chantix - reviewed benefits. Pt will contact PCP if he wants to try it.  Medication management -Reviewed medication changes from hospital discharge and recent OV with Heart clinic; pt affirms compliance with current med list -Pt reports pain with rib fractures (CPR) and wants to know what to use to help; Advised Tylenol and/or topical analgesic (Aspercreme, Voltaren gel, Icy Hot)   Patient Goals/Self-Care Activities Patient will:  - take medications as prescribed as evidenced by patient report and record review check glucose daily, document, and provide at future appointments check blood pressure periodically, document, and provide at future appointments      The patient verbalized understanding of instructions, educational materials, and care plan provided today and DECLINED offer to receive copy of patient instructions, educational materials, and care plan.  Telephone follow up appointment with pharmacy team member scheduled for: 3 months  [name], PharmD, [facility] Clinical Pharmacist [facility] [phone]

## 2022-08-23 NOTE — Progress Notes (Signed)
Chronic Care Management Pharmacy Note  08/23/2022 Name:  Evan Avery MRN:  [id number] DOB:  August 12, 1946  Summary: CCM F/U visit -Reviewed medication changes from recent hospital discharge and Heart/Vascular appt -Pt has been weighing daily, reports dry weight 224-225#; discussed H/V plan to increase furosemide to 2 tablets daily and increase Entresto to 49/51 mg -Pt has been monitoring BP at home, reports it has been "at goal" but unable to provide numbers; of note BP was elevated in clinic visit this morning (170/80) -DM: A1c 8.8% (05/2022); pt endorses compliance with new glimepiride -Pt c/o rib pain (fractures d/t CPR), he is not taking anything for pain  Recommendations/Changes made from today's visit: -Advised to take both furosemide tablets at once in AM -Advised he can take Tylenol for pain (up to 3000 mg/day) -Keep follow up appts with PCP and specialists  Plan: -[place] will call patient 2 months for BP/DM update -Pharmacist follow up televisit scheduled for 3 months -PCP appt 10/06/22   Subjective: Evan Avery is an 76 y.o. year old male who is a primary patient of [name], MD.  The CCM team was consulted for assistance with disease management and care coordination needs.    Engaged with patient by telephone for follow up visit in response to provider referral for pharmacy case management and/or care coordination services.   Consent to Services:  The patient was given information about Chronic Care Management services, agreed to services, and gave verbal consent prior to initiation of services.  Please see initial visit note for detailed documentation.   Patient Care Team: [name], MD as PCP - General (Family Medicine) Troy Sine, MD as PCP - Cardiology (Cardiology) Troy Sine, MD as Consulting Physician (Cardiology) Jackolyn Confer, MD as Consulting Physician ([facility]) Charlton Haws, [facility] as Pharmacist  (Pharmacist)  Recent office visits: 06/06/22 Dr Danise Mina OV: annual - A1c worse. Restart glimepiride 2 mg. F/u 3 months. 01/03/22 Dr Danise Mina OV: f/u DM. Start Jardiance 10 mg ([place]). Start Vascepa ([place]).   Recent consult visits: 08/23/22 NP Amy Clegg (Heart TOC): e-scribed Delene Loll and Jardiance to [place]. Samples provided in clinic. Increase Entresto to 49/51 mg BID. Increase Lasix to 40 mg daily  12/22/21 Dr Lynita Lombard ([place]) 10/25/21 Dr [name] (Cardiology): f/u CAD. Start Imdur 30 mg. Change atenolol to 25 mg AM, 12.5 mg PM (HR 43).  Hospital visits: 08/06/22 - 08/10/22 Admission [facility]): Cardiac arrest - EMS 6 shocks for VF before ROSC. Cardiac cath - PCI/DES to RCA. Put on DAPT w/ aspirin/plavix. Transitioned irbesartan to Entresto. No beta-blocker d/t baselien bradycardia. Plan to add spironolactone outpatient. EF 30-35%, f/u ECHO 45-50%   Objective:  Lab Results  Component Value Date   CREATININE 1.07 08/23/2022   BUN 13 08/23/2022   GFR 50.00 (L) 05/30/2022   EGFR 70 08/16/2022   GFRNONAA >60 08/23/2022   GFRAA >60 05/15/2018   NA 142 08/23/2022   K 3.9 08/23/2022   CALCIUM 9.4 08/23/2022   CO2 28 08/23/2022   GLUCOSE 145 (H) 08/23/2022    Lab Results  Component Value Date/Time   HGBA1C 8.8 (H) 05/30/2022 07:26 AM   HGBA1C 6.9 (A) 01/03/2022 09:57 AM   HGBA1C 8.1 (H) 01/04/2021 07:57 AM   HGBA1C 8.2 03/12/2020 12:00 AM   HGBA1C 8.4 02/12/2019 12:00 AM   GFR 50.00 (L) 05/30/2022 07:26 AM   GFR 63.62 01/04/2021 07:57 AM   MICROALBUR 7.8 (H) 05/30/2022 07:26 AM   MICROALBUR 0.2 12/27/2007 10:00  AM    Last diabetic Eye exam:  Lab Results  Component Value Date/Time   HMDIABEYEEXA No Retinopathy 07/23/2016 12:00 AM    Last diabetic Foot exam:  Lab Results  Component Value Date/Time   HMDIABFOOTEX done 08/24/2009 12:00 AM     Lab Results  Component Value Date   CHOL 91 05/30/2022   HDL 33.90 (L) 05/30/2022   LDLCALC 20 05/30/2022   LDLDIRECT 61.0 12/27/2018   TRIG 185.0  (H) 05/30/2022   CHOLHDL 3 05/30/2022       Latest Ref Rng & Units 08/07/2022    5:23 PM 08/07/2022    5:09 AM 08/06/2022    5:30 PM  Hepatic Function  Total Protein 6.5 - 8.1 g/dL 5.5  5.1  5.7   Albumin 3.5 - 5.0 g/dL 3.1  2.9  3.4   AST 15 - 41 U/L 204  248  237   ALT 0 - 44 U/L 134  126  85   Alk Phosphatase 38 - 126 U/L 42  43  51   Total Bilirubin 0.3 - 1.2 mg/dL 0.8  0.9  0.7     Lab Results  Component Value Date/Time   TSH 0.90 03/12/2020 12:00 AM   TSH 1.39 02/18/2019 12:00 AM   TSH 1.113 05/16/2016 05:26 PM   TSH 1.11 03/13/2016 08:03 AM   TSH 1.13 02/28/2011 12:08 PM       Latest Ref Rng & Units 08/23/2022   10:36 AM 08/09/2022    5:07 AM 08/08/2022    4:23 AM  CBC  WBC 4.0 - 10.5 K/uL 5.9  7.1  8.9   Hemoglobin 13.0 - 17.0 g/dL 12.8  11.3  11.4   Hematocrit 39.0 - 52.0 % 39.1  34.4  34.1   Platelets 150 - 400 K/uL 228  82  81     Lab Results  Component Value Date/Time   VD25OH 38.51 03/12/2020 12:00 AM   VD25OH 43.03 08/09/2018 12:00 AM    Clinical ASCVD: Yes  The ASCVD Risk score (Arnett DK, et al., 2019) failed to calculate for the following reasons:   The patient has a prior MI or stroke diagnosis       06/06/2022    9:32 AM 01/10/2021    8:02 AM 01/02/2020    9:47 AM  Depression screen PHQ 2/9  Decreased Interest 0 0 0  Down, Depressed, Hopeless 0 0 0  PHQ - 2 Score 0 0 0  Altered sleeping   0  Tired, decreased energy   0  Change in appetite   0  Feeling bad or failure about yourself    0  Trouble concentrating   0  Moving slowly or fidgety/restless   0  Suicidal thoughts   0  PHQ-9 Score   0  Difficult doing work/chores   Not difficult at all     Social History   Tobacco Use  Smoking Status Every Day   Packs/day: 1.00   Years: 50.00   Total pack years: 50.00   Types: Cigarettes   Start date: 10/23/1958  Smokeless Tobacco Never   BP Readings from Last 3 Encounters:  08/23/22 (!) 170/80  08/10/22 (!) 140/69  06/06/22 134/62    Pulse Readings from Last 3 Encounters:  08/23/22 65  08/10/22 (!) 110  06/06/22 (!) 47   Wt Readings from Last 3 Encounters:  08/23/22 228 lb 12.8 oz (103.8 kg)  08/10/22 235 lb 3.2 oz (106.7 kg)  06/06/22 228 lb 2  oz (103.5 kg)   BMI Readings from Last 3 Encounters:  08/23/22 33.79 kg/m  08/10/22 34.73 kg/m  06/06/22 35.20 kg/m    Assessment/Interventions: Review of patient past medical history, allergies, medications, health status, including review of consultants reports, laboratory and other test data, was performed as part of comprehensive evaluation and provision of chronic care management services.   SDOH:  (Social Determinants of Health) assessments and interventions performed: No SDOH Interventions    Flowsheet Row ED to Hosp-Admission (Discharged) from 08/06/2022 in [place] Management from 02/21/2022 in [place] at [place] Management from 09/05/2021 in [place] from 12/27/2018 in [place] [facility] [place] Interventions Intervention Not Indicated Intervention Not Indicated -- --  Housing Interventions Intervention Not Indicated -- -- --  Transportation Interventions -- Intervention Not Indicated -- --  Utilities Interventions Intervention Not Indicated -- -- --  Alcohol Usage Interventions Intervention Not Indicated (Score <7) -- -- --  Depression Interventions/Treatment  -- -- -- TML4-6 Score <4 Follow-up Not Indicated  Financial Strain Interventions Intervention Not Indicated -- Intervention Not Indicated --      SDOH Screenings   Food Insecurity: No Food Insecurity (08/09/2022)  Housing: Low Risk  (08/09/2022)  Transportation Needs: No Transportation Needs (02/24/2022)  Utilities: Not At Risk (08/09/2022)  Alcohol Screen: Low Risk  (08/09/2022)  Depression (PHQ2-9): Low Risk  (06/06/2022)  Financial Resource  Strain: Medium Risk (08/09/2022)  Physical Activity: Inactive (01/02/2020)  Stress: No Stress Concern Present (01/02/2020)  Tobacco Use: High Risk (08/09/2022)    CCM Care Plan  Allergies  Allergen Reactions   Dilaudid [Hydromorphone Hcl] Itching   Ambien [Zolpidem] Other (See Comments)    Pt states "makes me wild"   Niacin And Related Other (See Comments)    Extreme fatigue    Medications Reviewed Today     Reviewed by Charlton Haws, [facility] (Pharmacist) on 08/23/22 at 1358  Med List Status: <None>   Medication Order Taking? Sig Documenting Provider Last Dose Status Informant  aspirin EC 81 MG tablet [id number] Yes Take 1 tablet (81 mg total) by mouth daily. Swallow whole. Leonie Man, MD Taking Active   atorvastatin (LIPITOR) 80 MG tablet [id number] Yes Take 1 tablet (80 mg total) by mouth daily. Leonie Man, MD Taking Active   Brinzolamide-Brimonidine 1-0.2 % SUSP [id number] Yes Apply 1 drop to eye 3 (three) times daily.  [provider] Taking Active Spouse/Significant Other, Family Member  cetirizine (ZYRTEC) 10 MG tablet [id number] Yes Take 10 mg by mouth daily as needed for allergies. [provider] Taking Active Spouse/Significant Other, Family Member  clopidogrel (PLAVIX) 75 MG tablet [id number] Yes Take 1 tablet by mouth once daily [name], MD Taking Active Spouse/Significant Other, Family Member  Coenzyme Q10 (COQ10) 200 MG CAPS [id number] Yes Take 200 mg by mouth daily. [provider] Taking Active Spouse/Significant Other, Family Member  empagliflozin (JARDIANCE) 10 MG TABS tablet [id number] Yes Take 1 tablet (10 mg total) by mouth daily before breakfast. Clegg, Amy D, NP Taking Active   ezetimibe (ZETIA) 10 MG tablet [id number] Yes Take 1 tablet (10 mg total) by mouth daily. Leonie Man, MD Taking Active   famotidine (PEPCID) 20 MG tablet [id number] Yes Take 20 mg by mouth daily as needed for heartburn or indigestion. [provider] Taking Active   fluticasone (FLONASE) 50 MCG/ACT nasal  spray [id number] Yes Place 2 sprays into both nostrils daily. [name], MD Taking Active Spouse/Significant Other, Family Member  furosemide (LASIX) 20 MG tablet [id number] Yes Take 2 tablets (40 mg total) by mouth daily as needed for fluid or edema (for weight gain of 3 pounds or more in one day or for shortness of breath). Darrick Grinder D, NP Taking Active   gabapentin (NEURONTIN) 100 MG capsule [id number] Yes Take 100 mg by mouth 3 (three) times daily. [provider] Taking Active Spouse/Significant Other, Family Member  glimepiride (AMARYL) 2 MG tablet [id number] Yes Take 1 tablet (2 mg total) by mouth daily before breakfast. [name], MD Taking Active Spouse/Significant Other, Family Member  latanoprost (XALATAN) 0.005 % ophthalmic solution [id number] Yes Place 1 drop into both eyes at bedtime. [provider] Taking Active Spouse/Significant Other, Family Member  metFORMIN (GLUCOPHAGE) 1000 MG tablet [id number] Yes TAKE 1 TABLET BY MOUTH TWICE DAILY WITH MEALS  Patient taking differently: Take 1,000 mg by mouth 2 (two) times daily with a meal.   [name], MD Taking Active Spouse/Significant Other, Family Member  nitroGLYCERIN (NITROSTAT) 0.4 MG SL tablet [id number] Yes For chest pain, tightness, or pressure. While sitting, place 1 tablet under tongue. May be used every 5 minutes as needed, for up to 15 minutes. Do not use more than 3 tablets. Leonie Man, MD Taking Active   omega-3 acid ethyl esters (LOVAZA) 1 g capsule [id number] Yes Take 2 capsules by mouth twice daily  Patient taking differently: Take 2 g by mouth 2 (two) times daily.   [name], MD Taking Active Spouse/Significant Other, Family Member  sacubitril-valsartan [facility]) 49-51 MG [id number] Yes Take 1 tablet by mouth 2 (two) times daily. Darrick Grinder D, NP Taking Active   senna-docusate (SENOKOT-S) 8.6-50 MG tablet  [id number] Yes Take 1 tablet by mouth at bedtime as needed for mild constipation. Louellen Molder, MD Taking Active Spouse/Significant Other, Family Member  TURMERIC CURCUMIN PO [id number] Yes Take 1 capsule by mouth daily. [provider] Taking Active Spouse/Significant Other, Family Member            Patient Active Problem List   Diagnosis Date Noted   Metabolic alkalosis with respiratory acidosis 08/07/2022   Acute hyperkalemia - in setting of Combined Metabolic/Respiratory Acidosis 08/07/2022   Ventricular fibrillation ([place]) - Ischemic - with Inferior MI - subTotal mRCA pre-stent 08/06/2022   Acute respiratory failure with hypoxia ([place]) 08/06/2022   ST segment depression 08/06/2022   AKI (acute kidney injury) ([place]) 08/06/2022   Cardiac arrest with successful resuscitation ([place]) 08/06/2022   Shock ([place]) 08/06/2022   Presence of bare metal stent in right coronary artery - BMS x 4 to RCA in 1990s. 08/06/2022   Presence of drug-eluting stent in right coronary artery 08/06/2022   Non-ST elevation (NSTEMI) myocardial infarction ([place]) 08/06/2022   Ischemic cardiomyopathy 08/06/2022   Abdominal wall hernia 06/07/2022   Bradycardia 04/10/2020   Type 2 diabetes mellitus with diabetic nephropathy, without long-term current use of insulin ([place]) 04/10/2020   Headache 04/09/2020   Stenosis of right subclavian artery ([place]) 02/08/2020   Atypical nevus 01/11/2020   GERD (gastroesophageal reflux disease) 09/03/2019   Adenoma of left adrenal gland 08/25/2019   Thoracic aorta atherosclerosis ([place]) 05/18/2019   COPD (chronic obstructive pulmonary disease) ([place]) 05/18/2019   Fatty liver 03/30/2018   Gallstone 03/30/2018   Acute bacterial sinusitis 12/18/2017   Osteoarthritis 03/08/2017   Epidermal cyst of neck 03/06/2017   Primary open angle glaucoma (  POAG) of both eyes, moderate stage    Health maintenance examination 10/07/2014   Medicare annual wellness visit, subsequent 10/07/2014    Advanced care planning/counseling discussion 10/07/2014   Smoker 10/07/2014   Bilateral foot pain 09/07/2014   Coronary artery disease involving native coronary artery of native heart with unstable angina pectoris ([place]) 04/17/2014   Hyperlipidemia associated with type 2 diabetes mellitus ([place]), target LDL <70 10/08/2013   Essential hypertension 10/08/2013   Severe obesity (BMI 35.0-39.9) with comorbidity ([place]) 10/08/2013   Complex tear of medial meniscus of left knee as current injury 07/26/2012   Thrombocytopenia ([place]) 10/12/2011   OTHER ATOPIC DERMATITIS AND RELATED CONDITIONS 06/24/2010   LUMBAR RADICULOPATHY 07/18/2007   Type 2 diabetes mellitus with CAD 07/05/2007   HIP PAIN, BILATERAL 07/05/2007   OSA on CPAP 05/07/2007   Agent orange exposure 10/23/1966    Immunization History  Administered Date(s) Administered   Fluad Quad(high Dose 65+) 07/29/2019, 08/26/2021   Influenza Split 08/02/2012   Influenza, High Dose Seasonal PF 07/07/2020   Influenza,inj,Quad PF,6+ Mos 09/17/2013, 09/07/2014, 09/06/2016, 11/07/2017   Influenza-Unspecified 09/28/2015, 07/23/2018   PFIZER(Purple Top)SARS-COV-2 Vaccination 12/15/2019, 01/05/2020   Pneumococcal Conjugate-13 10/23/2013   Pneumococcal Polysaccharide-23 04/13/2015   Pneumococcal-Unspecified 09/24/2008, 10/23/2012   Td 10/23/2000, 10/11/2021   Tdap 10/23/2012    Conditions to be addressed/monitored:  Hypertension, Hyperlipidemia, Diabetes, Coronary Artery Disease, and Tobacco use  Care Plan : [place]  Updates made by Charlton Haws, [place] since 08/23/2022 12:00 AM     Problem: Hypertension, Hyperlipidemia, Diabetes, Coronary Artery Disease, and Tobacco use   Priority: High     Long-Range Goal: Disease Management   Start Date: 09/05/2021  Expected End Date: 02/25/2023  This Visit's Progress: On track  Recent Progress: On track  Priority: High  Note:   Current Barriers:  Several medication changes  following cardiac arrrest  Pharmacist Clinical Goal(s):  Patient will adhere to prescribed medication regimen as evidenced by pt report contact provider office for questions/concerns as evidenced notation of same in electronic health record through collaboration with PharmD and provider.   Pharmacist Clinical Goal(s):  Patient will contact provider office for questions/concerns as evidenced notation of same in electronic health record through collaboration with PharmD and provider.  Interventions: 1:1 collaboration with [name], MD regarding development and update of comprehensive plan of care as evidenced by provider attestation and co-signature Inter-disciplinary care team collaboration (see longitudinal plan of care) Comprehensive medication review performed; medication list updated in electronic medical record  Hypertension / Heart failure (BP goal <130/80) -Query controlled - pt saw Heart clinic today and Entresto and lasix doses were increased, they are following up in 1 week with clinic; pt wife reports he has been retaining fluid since discharge -Current home readings: checking daily, at goal per wife -Daily weights: 224-225# dry weight -Last ejection fraction: 45-50% (Date: 08/07/22) -HF type: HFimpEF (EF improved from <40% to > 40%) - ischemic cardiomyopathy, Grade II diastolic dysfunction -NYHA Class: II (slight limitation of activity) -Aggravating factors: Hx OSA (wears CPAP); smokes 1 PPD, coffee 2-3 cups daily -Current treatment: Furosemide 20 mg - 2 tab daily PRN - Appropriate, Query Effective Entresto 49/51 mg BID ([place]) -Appropriate, Query Effective Jardiance 10 mg daily ([place]) - Appropriate, Query Effective -Medications previously tried: atenolol, irbesartan, lisinopril, isosorbide -Educated on BP goals and benefits of medications for prevention of heart attack, stroke and kidney damage; -Reviewed medication purposes; advised to take furosemide 2 tablets at once  rather than BID -  Counseled to monitor BP at home daily -Recommended to continue current medication  Hyperlipidemia / CAD (LDL goal < 55) -Controlled - LDL 20 (05/2022) at goal; pt affirms compliance with aspirin (added at discharge) -Hx CAD (MI s/p 4 stents); Tie exception for Lovaza denied 01/2022 - cost $90/3 months -Cardiac arrest 08/06/22 w/ PCI/DES. -Current treatment: Ezetimibe 10 mg daily -Appropriate, Effective, Safe, Accessible Atorvastatin 80 mg daily -Appropriate, Effective, Safe, Accessible Clopidogrel 75 mg daily -Appropriate, Effective, Safe, Accessible Aspirin EC 81 mg daily - Appropriate, Effective, Safe, Accessible Lovaza 1 g - 2 capsules BID  - Appropriate, Effective, Safe, Accessible Nitroglycerin 0.4 mg SL prn -Appropriate, Effective, Safe, Accessible -Medications previously tried: Lovaza, isosorbide [place] -Educated on Cholesterol goals;  -Recommend to continue current medication  Diabetes (A1c goal <7%) -Uncontrolled - A1c 8.8% (05/2022) -Current home glucose readings: none available -Current medications: Metformin 1000 mg BID - Appropriate, Query Effective Glimepiride 2 mg daily AM - Appropriate, Query Effective Jardiance 10 mg daily -Appropriate, Query Effective -Medications previously tried: Ozempic, Januvia (cost), glimepiride, actos -Educated on A1c and blood sugar goals; Carbohydrate counting and/or plate method; Discussed limiting portions and sweets. -Recommended to continue current medication  Tobacco use (Goal cessation) -Not ideally controlled - pt is smoking 1 PPD; he would like to be able to quit, he has nicotine patches in his cabinet -Previous quit attempts: 1990s - quit for 2 years cold [place] -Reviewed benefits of smoking cessation (improved BP, DM, lower risk for lung cancer/COPD) -Discussed Chantix - reviewed benefits. Pt will contact PCP if he wants to try it.  Medication management -Reviewed medication changes from hospital discharge and recent  OV with Heart clinic; pt affirms compliance with current med list -Pt reports pain with rib fractures (CPR) and wants to know what to use to help; Advised Tylenol and/or topical analgesic (Aspercreme, Voltaren gel, Icy Hot)   Patient Goals/Self-Care Activities Patient will:  - take medications as prescribed as evidenced by patient report and record review check glucose daily, document, and provide at future appointments check blood pressure periodically, document, and provide at future appointments       Medication Assistance:  Patient gets [place] through [place]   Compliance/Adherence/Medication fill history: Care Gaps: None  Star-Rating Drugs: Atorvastatin - PDC 100% Jardiance - PDC 94% Glimepiride - PDC 100% Metformin - PDC 90%  Medication Access: Within the past 30 days, how often has patient missed a dose of medication? 0 Is a pillbox or other method used to improve adherence? Yes  Factors that may affect medication adherence? no barriers identified Are meds synced by current pharmacy? No  Are meds delivered by current pharmacy? No  Does patient experience delays in picking up medications due to transportation concerns? No   [facility] Services Reviewed: Is patient disadvantaged to use [facility]?: Yes  Current Rx insurance plan: [employer] / [place] Name and location of Current pharmacy:  [facility], [place] - [address] [place] [address] Phone: [phone] Fax: [place] [address] [place] [address] Phone: [phone] Fax: [place], [place] - [address] [place] [facility] [address] [place] [address] Phone: [phone] Fax: [phone]  [facility] services reviewed with patient today?: No  Patient requests to transfer care to [facility]?: No  Reason patient  declined to change pharmacies: Disadvantaged due to insurance/mail order   Care Plan and Follow Up Patient  Decision:  Patient agrees to Care Plan and Follow-up.  Plan: Telephone follow up appointment with care management team member scheduled for:  3 months  [name], PharmD, BCACP Clinical Pharmacist [facility] at [facility] [phone]

## 2022-08-23 NOTE — Progress Notes (Signed)
[facility] Transitions of Care Clinic Heart Failure Pharmacist Encounter  PCP: Gutierrez, Javier, MD PCP-Cardiologist: Thomas Kelly, MD  Met with patient and his wife in HF TOC clinic regarding Entresto and Jardiance prescription questions. He states he has the Medicare Heart and Diabetes card but has gone into the donut hole. He has [facility] benefits that will cover the Entresto and Jardiance. Samples provided in clinic and e-scribed Jardiance [facility] to [facility] to [facility] filled and delivered via mail to his home.   Megan Boothby, PharmD, BCPS Heart Failure Transitions of Care Clinic Pharmacist [phone]   

## 2022-08-29 ENCOUNTER — Telehealth (HOSPITAL_COMMUNITY): Payer: Self-pay | Admitting: *Deleted

## 2022-08-29 ENCOUNTER — Ambulatory Visit (HOSPITAL_COMMUNITY)
Admission: RE | Admit: 2022-08-29 | Discharge: 2022-08-29 | Disposition: A | Discharge status: Home / Self Care | Payer: HMO | Source: Ambulatory Visit | Attending: Adult Health | Admitting: Adult Health

## 2022-08-29 VITALS — BP 140/80 | HR 72 | Wt 217.0 lb

## 2022-08-29 DIAGNOSIS — E119 Type 2 diabetes mellitus without complications: Secondary | ICD-10-CM | POA: Insufficient documentation

## 2022-08-29 DIAGNOSIS — J449 Chronic obstructive pulmonary disease, unspecified: Secondary | ICD-10-CM | POA: Diagnosis not present

## 2022-08-29 DIAGNOSIS — I25110 Atherosclerotic heart disease of native coronary artery with unstable angina pectoris: Secondary | ICD-10-CM | POA: Insufficient documentation

## 2022-08-29 DIAGNOSIS — E669 Obesity, unspecified: Secondary | ICD-10-CM | POA: Insufficient documentation

## 2022-08-29 DIAGNOSIS — I5022 Chronic systolic (congestive) heart failure: Secondary | ICD-10-CM | POA: Insufficient documentation

## 2022-08-29 DIAGNOSIS — I2510 Atherosclerotic heart disease of native coronary artery without angina pectoris: Secondary | ICD-10-CM | POA: Insufficient documentation

## 2022-08-29 DIAGNOSIS — E785 Hyperlipidemia, unspecified: Secondary | ICD-10-CM | POA: Diagnosis not present

## 2022-08-29 DIAGNOSIS — Z7902 Long term (current) use of antithrombotics/antiplatelets: Secondary | ICD-10-CM | POA: Insufficient documentation

## 2022-08-29 DIAGNOSIS — Z79899 Other long term (current) drug therapy: Secondary | ICD-10-CM | POA: Insufficient documentation

## 2022-08-29 DIAGNOSIS — Z7984 Long term (current) use of oral hypoglycemic drugs: Secondary | ICD-10-CM | POA: Diagnosis not present

## 2022-08-29 DIAGNOSIS — I110 Hypertensive heart disease with heart failure: Secondary | ICD-10-CM | POA: Diagnosis not present

## 2022-08-29 DIAGNOSIS — Z8674 Personal history of sudden cardiac arrest: Secondary | ICD-10-CM | POA: Diagnosis not present

## 2022-08-29 DIAGNOSIS — Z955 Presence of coronary angioplasty implant and graft: Secondary | ICD-10-CM | POA: Diagnosis not present

## 2022-08-29 DIAGNOSIS — Z7982 Long term (current) use of aspirin: Secondary | ICD-10-CM | POA: Diagnosis not present

## 2022-08-29 DIAGNOSIS — G4733 Obstructive sleep apnea (adult) (pediatric): Secondary | ICD-10-CM | POA: Diagnosis not present

## 2022-08-29 DIAGNOSIS — I252 Old myocardial infarction: Secondary | ICD-10-CM | POA: Insufficient documentation

## 2022-08-29 DIAGNOSIS — F17210 Nicotine dependence, cigarettes, uncomplicated: Secondary | ICD-10-CM | POA: Insufficient documentation

## 2022-08-29 LAB — BASIC METABOLIC PANEL
Anion gap: 15 (ref 5–15)
BUN: 30 mg/dL — ABNORMAL HIGH (ref 8–23)
CO2: 26 mmol/L (ref 22–32)
Calcium: 9.9 mg/dL (ref 8.9–10.3)
Chloride: 100 mmol/L (ref 98–111)
Creatinine, Ser: 1.26 mg/dL — ABNORMAL HIGH (ref 0.61–1.24)
GFR, Estimated: 59 mL/min — ABNORMAL LOW (ref >60)
Glucose, Bld: 119 mg/dL — ABNORMAL HIGH (ref 70–99)
Potassium: 4.3 mmol/L (ref 3.5–5.1)
Sodium: 141 mmol/L (ref 135–145)

## 2022-08-29 MED ORDER — FUROSEMIDE 20 MG PO TABS: 20.0000 mg | ORAL_TABLET | Freq: Every day | ORAL | 2 refills | Status: DC

## 2022-08-29 NOTE — Progress Notes (Signed)
Evan Avery Kitchen   HEART IMPACT TRANSITIONS OF CARE    PCP: Primary Cardiologist:  HPI: Evan Avery is a 76 year old with a history of R subclavian  stenosis, OSA, DMII, hyperlipidemia, CAD, DMII, obesity,  and COPD.    Admitted 08/06/22 with cardiac arrest /VF. Bystander performed CPR with 6 shocks to ROSC. ACS/NTEMI . Urgent cath with PCI/DES to RCA otherwise LM, LAD, circumflex patent.  Placed on aspirin and plavix. Unable to place on bb due to bradycardia. Discharged 08/10/22    He was seen in HF TOC last week . Volume overloaded.  Entresto was increased to 49-51 mg twice a day and lasix was increased to 40 mg daily.   Today he returns for HF follow up with his wife. Took a shower earlier today and now noticed chest soreness.  He has been able to walk up to the mail box. Using a rolling walker. No longer sleeping in the recliner. Has been able to walk  up 13 steps to his bedroom.  Overall feeling stronger.  Denies SOB/PND/Orthopnea. Appetite ok. No fever or chills. Weight at home 225--->218  pounds. Taking all medications   Cardiac Testing  08/07/22 Echo EF 45-50%  Grade II DD, basal -to md  inferior LV hypokinetic.   08/06/22 Cath  mRCA 99% (TIMI 2 flow) just above the previously placed BMS.  Left-to-right collaterals.  Heavily calcified vessel with the proximal segment segmentally 50% narrowed/calcified. PTCA-PCI with overlapping Synergy XD DES 3.5 x 28 postdilated with 3.75 mm 20 mm Butler balloon to high ATM => 99% lesion reduced to 0, but persistent underexpansion roughly 50% in the more proximal segment. Left main, LAD and LCx patent. EF estimated 30 to 35% with EDP 17%.  Severely hypokinetic to akinetic inferior wall (basal to apical)  ROS: All systems negative except as listed in HPI, PMH and Problem List.  SH:  Social History   Socioeconomic History   Marital status: Married    Spouse name: Evan Avery   Number of children: 2   Years of education: Not on file   Highest education level: GED  or equivalent  Occupational History   Occupation: retired  Tobacco Use   Smoking status: Every Day    Packs/day: 1.00    Years: 50.00    Total pack years: 50.00    Types: Cigarettes    Start date: 10/23/1958   Smokeless tobacco: Never  Vaping Use   Vaping Use: Never used  Substance and Sexual Activity   Alcohol use: Yes    Alcohol/week: 0.0 standard drinks of alcohol    Comment: rarely-1 drink every 2-3 months   Drug use: No   Sexual activity: Yes  Other Topics Concern   Not on file  Social History Narrative   Lives with wife and son (with mental issues)   Occupation: retired, was Multimedia programmer for [employer]   Activity: no regular exercise   Diet: good water, fruits/vegetables daily   Social Determinants of Health   Financial Resource Strain: Medium Risk (08/09/2022)   Overall Financial Resource Strain (CARDIA)    Difficulty of Paying Living Expenses: Somewhat hard  Food Insecurity: No Food Insecurity (08/09/2022)   Hunger Vital Sign    Worried About Running Out of Food in the Last Year: Never true    [place] in the Last Year: Never true  Transportation Needs: No Transportation Needs (02/24/2022)   PRAPARE - Transportation    Lack of Transportation (Medical): No    Lack of  Transportation (Non-Medical): No  Physical Activity: Inactive (01/02/2020)   Exercise Vital Sign    Days of Exercise per Week: 0 days    Minutes of Exercise per Session: 0 min  Stress: No Stress Concern Present (01/02/2020)   [place]    Feeling of Stress : Not at all  Social Connections: Not on file  Intimate Partner Violence: Not At Risk (01/02/2020)   Humiliation, Afraid, Rape, and Kick questionnaire    Fear of Current or Ex-Partner: No    Emotionally Abused: No    Physically Abused: No    Sexually Abused: No    FH:  Family History  Problem Relation Age of Onset   Alzheimer's disease Mother    Heart disease Father     Heart attack Brother    Diabetes Brother    Heart disease Brother    Heart disease Maternal Grandmother    Colon cancer Maternal Grandfather    Diabetes Paternal Grandfather    Diabetes Son    Esophageal cancer Neg Hx    Rectal cancer Neg Hx    Stomach cancer Neg Hx     Past Medical History:  Diagnosis Date   Agent orange exposure 1968   Arthritis    CAD (coronary artery disease)    4 stents in right artery   Complex tear of medial meniscus of left knee as current injury 07/26/2012   [place]   Diabetes mellitus    type 2   Diverticulitis of colon with perforation 05/23/2016   DVT (deep venous thrombosis) (HCC)    GERD (gastroesophageal reflux disease)    Glaucoma 2006   [facility]   Hx of colonic polyps    Hyperlipidemia    Hypertension    Intra-abdominal abscess ([place]) 05/16/2016   LUMBAR RADICULOPATHY 07/18/2007   Qualifier: Diagnosis of  By: Arnoldo Morale MD, John E    Myocardial infarction [facility])    1992   Sleep apnea    uses CPAP regularly    Current Outpatient Medications  Medication Sig Dispense Refill   aspirin EC 81 MG tablet Take 1 tablet (81 mg total) by mouth daily. Swallow whole. 90 tablet 3   atorvastatin (LIPITOR) 80 MG tablet Take 1 tablet (80 mg total) by mouth daily. 90 tablet 3   Brinzolamide-Brimonidine 1-0.2 % SUSP Apply 1 drop to eye 3 (three) times daily.      cetirizine (ZYRTEC) 10 MG tablet Take 10 mg by mouth daily as needed for allergies.     clopidogrel (PLAVIX) 75 MG tablet Take 1 tablet by mouth once daily 90 tablet 1   Coenzyme Q10 (COQ10) 200 MG CAPS Take 200 mg by mouth daily.     empagliflozin (JARDIANCE) 10 MG TABS tablet Take 1 tablet (10 mg total) by mouth daily before breakfast. 90 tablet 3   ezetimibe (ZETIA) 10 MG tablet Take 1 tablet (10 mg total) by mouth daily. 90 tablet 3   famotidine (PEPCID) 20 MG tablet Take 20 mg by mouth daily as needed for heartburn or indigestion.     fluticasone (FLONASE) 50 MCG/ACT nasal spray  Place 2 sprays into both nostrils daily. 16 g 1   furosemide (LASIX) 20 MG tablet Take 2 tablets (40 mg total) by mouth daily as needed for fluid or edema (for weight gain of 3 pounds or more in one day or for shortness of breath). 30 tablet 11   gabapentin (NEURONTIN) 100 MG capsule Take  100 mg by mouth 3 (three) times daily.     glimepiride (AMARYL) 2 MG tablet Take 1 tablet (2 mg total) by mouth daily before breakfast. 90 tablet 3   latanoprost (XALATAN) 0.005 % ophthalmic solution Place 1 drop into both eyes at bedtime.     metFORMIN (GLUCOPHAGE) 1000 MG tablet TAKE 1 TABLET BY MOUTH TWICE DAILY WITH MEALS (Patient taking differently: Take 1,000 mg by mouth 2 (two) times daily with a meal.) 180 tablet 0   nitroGLYCERIN (NITROSTAT) 0.4 MG SL tablet For chest pain, tightness, or pressure. While sitting, place 1 tablet under tongue. May be used every 5 minutes as needed, for up to 15 minutes. Do not use more than 3 tablets. 25 tablet 3   omega-3 acid ethyl esters (LOVAZA) 1 g capsule Take 2 capsules by mouth twice daily (Patient taking differently: Take 2 g by mouth 2 (two) times daily.) 360 capsule 0   sacubitril-valsartan (ENTRESTO) 49-51 MG Take 1 tablet by mouth 2 (two) times daily. 60 tablet 11   senna-docusate (SENOKOT-S) 8.6-50 MG tablet Take 1 tablet by mouth at bedtime as needed for mild constipation. 15 tablet 0   TURMERIC CURCUMIN PO Take 1 capsule by mouth daily.     No current facility-administered medications for this encounter.    Vitals:   08/29/22 1128  BP: (!) 140/80  Pulse: 72  SpO2: 95%  Weight: 98.4 kg (217 lb)   Wt Readings from Last 3 Encounters:  08/29/22 98.4 kg (217 lb)  08/23/22 103.8 kg (228 lb 12.8 oz)  08/10/22 106.7 kg (235 lb 3.2 oz)    PHYSICAL EXAM: Generl:  walked in the clinic with a rolling walker. No resp difficulty HEENT: normal Neck: supple. JVP flat. Carotids 2+ bilaterally; no bruits. No lymphadenopathy or thryomegaly appreciated. Cor: PMI  normal. Regular rate & rhythm. No rubs, gallops or murmurs. Lungs: clear Abdomen: soft, nontender, nondistended. No hepatosplenomegaly. No bruits or masses. Good bowel sounds. Extremities: no cyanosis, clubbing, rash, edema Neuro: alert & orientedx3, cranial nerves grossly intact. Moves all 4 extremities w/o difficulty. Affect pleasant.   EKG : SR   ASSESSMENT & PLAN:  1. HFmEF, ICM 07/2022 Echo EF 40-45%, repeat ECHO in 3-4 months after HF meds optimized.   NYHA II GDMT  Diuretic-Appears euvolemic . Cut back lasix tro 20 mg daily.   BB- Consider at his follow up with Dr [name]. .  Ace/ARB/ARNI- Continue entresto 49-51 mg twice a day.  MRA- Consider next visit.  SGLT2i- Continue jardiance 10 mg daily.   - Check BMET today    2. S/P VF Arrest 2/2 inferior MI--> CAD Cath 07/2022 DES to RCA  - no chest pain.  -On asa, high intensity statin, and plavix.    3. HLD On high intensity statin.    He was asked to f/u for [facility] for jardiance and entresto. Both prescriptions were sent in last week and he hasn't received in the mail.  Follow up as needed. He has follow up with Dr [name] next week.   Larken Urias NP_C  11:31 AM

## 2022-08-29 NOTE — Patient Instructions (Addendum)
EKG done today.  Labs done today. We will contact you only if your labs are abnormal.  DECREASE Lasix to '20mg'$  (1 tablet) by mouth daily.   No other medication changes were made. Please continue all current medications as prescribed.  Please call [facility].   Thank you for allowing [place] to provide your heart failure care after your recent hospitalization.

## 2022-08-29 NOTE — Telephone Encounter (Signed)
Called to confirm Heart & Vascular Transitions of Care appointment at 11:30 on 08/29/22. Patient reminded to bring all medications and pill box organizer with them. Confirmed patient has transportation. Gave directions, instructed to utilize [place] parking.  Confirmed appointment prior to ending call.    Earnestine Leys, BSN, Clinical cytogeneticist Only

## 2022-08-30 ENCOUNTER — Ambulatory Visit (INDEPENDENT_AMBULATORY_CARE_PROVIDER_SITE_OTHER): Payer: HMO

## 2022-08-30 DIAGNOSIS — I10 Essential (primary) hypertension: Secondary | ICD-10-CM

## 2022-08-30 DIAGNOSIS — K439 Ventral hernia without obstruction or gangrene: Secondary | ICD-10-CM

## 2022-08-30 DIAGNOSIS — M1990 Unspecified osteoarthritis, unspecified site: Secondary | ICD-10-CM

## 2022-08-30 DIAGNOSIS — Z6834 Body mass index (BMI) 34.0-34.9, adult: Secondary | ICD-10-CM

## 2022-08-30 DIAGNOSIS — I4901 Ventricular fibrillation: Secondary | ICD-10-CM

## 2022-08-30 DIAGNOSIS — E785 Hyperlipidemia, unspecified: Secondary | ICD-10-CM

## 2022-08-30 DIAGNOSIS — E1165 Type 2 diabetes mellitus with hyperglycemia: Secondary | ICD-10-CM

## 2022-08-30 DIAGNOSIS — G4733 Obstructive sleep apnea (adult) (pediatric): Secondary | ICD-10-CM

## 2022-08-30 DIAGNOSIS — I700 Atherosclerosis of aorta: Secondary | ICD-10-CM

## 2022-08-30 DIAGNOSIS — L2089 Other atopic dermatitis: Secondary | ICD-10-CM

## 2022-08-30 DIAGNOSIS — Z7902 Long term (current) use of antithrombotics/antiplatelets: Secondary | ICD-10-CM

## 2022-08-30 DIAGNOSIS — K219 Gastro-esophageal reflux disease without esophagitis: Secondary | ICD-10-CM

## 2022-08-30 DIAGNOSIS — Z86010 Personal history of colonic polyps: Secondary | ICD-10-CM

## 2022-08-30 DIAGNOSIS — K760 Fatty (change of) liver, not elsewhere classified: Secondary | ICD-10-CM

## 2022-08-30 DIAGNOSIS — Z7984 Long term (current) use of oral hypoglycemic drugs: Secondary | ICD-10-CM

## 2022-08-30 DIAGNOSIS — J9621 Acute and chronic respiratory failure with hypoxia: Secondary | ICD-10-CM | POA: Diagnosis not present

## 2022-08-30 DIAGNOSIS — Z23 Encounter for immunization: Secondary | ICD-10-CM | POA: Diagnosis not present

## 2022-08-30 DIAGNOSIS — H401132 Primary open-angle glaucoma, bilateral, moderate stage: Secondary | ICD-10-CM

## 2022-08-30 DIAGNOSIS — I214 Non-ST elevation (NSTEMI) myocardial infarction: Secondary | ICD-10-CM | POA: Diagnosis not present

## 2022-08-30 DIAGNOSIS — E1169 Type 2 diabetes mellitus with other specified complication: Secondary | ICD-10-CM | POA: Diagnosis not present

## 2022-08-30 DIAGNOSIS — K8020 Calculus of gallbladder without cholecystitis without obstruction: Secondary | ICD-10-CM

## 2022-08-30 DIAGNOSIS — J449 Chronic obstructive pulmonary disease, unspecified: Secondary | ICD-10-CM | POA: Diagnosis not present

## 2022-08-30 DIAGNOSIS — Z9181 History of falling: Secondary | ICD-10-CM

## 2022-08-30 DIAGNOSIS — L720 Epidermal cyst: Secondary | ICD-10-CM

## 2022-08-30 DIAGNOSIS — E1151 Type 2 diabetes mellitus with diabetic peripheral angiopathy without gangrene: Secondary | ICD-10-CM

## 2022-08-30 DIAGNOSIS — F17210 Nicotine dependence, cigarettes, uncomplicated: Secondary | ICD-10-CM

## 2022-08-30 DIAGNOSIS — M5416 Radiculopathy, lumbar region: Secondary | ICD-10-CM

## 2022-08-30 DIAGNOSIS — I771 Stricture of artery: Secondary | ICD-10-CM

## 2022-08-30 DIAGNOSIS — I255 Ischemic cardiomyopathy: Secondary | ICD-10-CM

## 2022-08-30 DIAGNOSIS — J439 Emphysema, unspecified: Secondary | ICD-10-CM

## 2022-08-30 DIAGNOSIS — Z955 Presence of coronary angioplasty implant and graft: Secondary | ICD-10-CM

## 2022-08-30 DIAGNOSIS — Z86718 Personal history of other venous thrombosis and embolism: Secondary | ICD-10-CM

## 2022-08-30 DIAGNOSIS — I25110 Atherosclerotic heart disease of native coronary artery with unstable angina pectoris: Secondary | ICD-10-CM

## 2022-08-30 DIAGNOSIS — E1121 Type 2 diabetes mellitus with diabetic nephropathy: Secondary | ICD-10-CM

## 2022-08-30 DIAGNOSIS — Z7982 Long term (current) use of aspirin: Secondary | ICD-10-CM

## 2022-09-06 ENCOUNTER — Telehealth (HOSPITAL_COMMUNITY): Payer: Self-pay

## 2022-09-06 ENCOUNTER — Other Ambulatory Visit (HOSPITAL_COMMUNITY): Payer: Self-pay

## 2022-09-06 ENCOUNTER — Encounter (HOSPITAL_COMMUNITY): Payer: Self-pay

## 2022-09-06 MED ORDER — EMPAGLIFLOZIN 10 MG PO TABS: 10.0000 mg | ORAL_TABLET | Freq: Every day | ORAL | 3 refills | Status: DC

## 2022-09-06 MED ORDER — ENTRESTO 49-51 MG PO TABS: 1.0000 | ORAL_TABLET | Freq: Two times a day (BID) | ORAL | 3 refills | Status: DC

## 2022-09-06 NOTE — Progress Notes (Signed)
Medication Samples have been provided to the patient.  Drug name: Jardiance       Strength: '10mg'$         Qty: 4  LOT: [id number]  Exp.Date: 11/25  Dosing instructions: Take 1 tablet daily.  The patient has been instructed regarding the correct time, dose, and frequency of taking this medication, including desired effects and most common side effects.   Asencion Noble 7:27 AM 09/06/2022

## 2022-09-06 NOTE — Telephone Encounter (Signed)
Received call from patient that [facility] is not able to fill the [place] and Jardiance scripts at this time. Advised to call Dr. Ronnald Ramp [phone] ext [phone] for further information.   Contacted Dr. Ronnald Ramp' office and was told that they need to be faxed the hard copy prescriptions in addition to office notes from our provider.   Complete package of progress notes from the last two clinic appts and signed hard copy prescriptions for Evan Avery and Evan Avery were faxed to [phone].

## 2022-09-07 ENCOUNTER — Ambulatory Visit
Payer: No Typology Code available for payment source | Attending: Cardiovascular Disease | Admitting: Cardiovascular Disease

## 2022-09-07 ENCOUNTER — Encounter: Payer: Self-pay | Admitting: Cardiovascular Disease

## 2022-09-07 VITALS — BP 104/52 | HR 73 | Ht 67.5 in | Wt 219.0 lb

## 2022-09-07 DIAGNOSIS — E118 Type 2 diabetes mellitus with unspecified complications: Secondary | ICD-10-CM

## 2022-09-07 DIAGNOSIS — I255 Ischemic cardiomyopathy: Secondary | ICD-10-CM | POA: Diagnosis not present

## 2022-09-07 DIAGNOSIS — K219 Gastro-esophageal reflux disease without esophagitis: Secondary | ICD-10-CM

## 2022-09-07 DIAGNOSIS — E785 Hyperlipidemia, unspecified: Secondary | ICD-10-CM | POA: Diagnosis not present

## 2022-09-07 DIAGNOSIS — I25110 Atherosclerotic heart disease of native coronary artery with unstable angina pectoris: Secondary | ICD-10-CM

## 2022-09-07 DIAGNOSIS — G4733 Obstructive sleep apnea (adult) (pediatric): Secondary | ICD-10-CM | POA: Diagnosis not present

## 2022-09-07 MED ORDER — METOPROLOL SUCCINATE ER 25 MG PO TB24: 12.5000 mg | ORAL_TABLET | Freq: Every day | ORAL | 3 refills | Status: DC

## 2022-09-07 NOTE — Patient Instructions (Signed)
Medication Instructions:  START- Metoprolol Succinate 12.5 mg by mouth daily  *If you need a refill on your cardiac medications before your next appointment, please call your pharmacy*   Lab Work: None Ordered   Testing/Procedures: None Ordered   Follow-Up: At [facility], you and your health needs are our priority.  As part of our continuing mission to provide you with exceptional heart care, we have created designated Provider Care Teams.  These Care Teams include your primary Cardiologist (physician) and Advanced Practice Providers (APPs -  Physician Assistants and Nurse Practitioners) who all work together to provide you with the care you need, when you need it.  We recommend signing up for the patient portal called "MyChart".  Sign up information is provided on this After Visit Summary.  MyChart is used to connect with patients for Virtual Visits (Telemedicine).  Patients are able to view lab/test results, encounter notes, upcoming appointments, etc.  Non-urgent messages can be sent to your provider as well.   To learn more about what you can do with MyChart, go to [email or web address].    Your next appointment:   3 month(s)  The format for your next appointment:   In Person  Provider:   Shelva Majestic, MD     Other Instructions

## 2022-09-07 NOTE — Progress Notes (Signed)
Patient ID: Evan Avery, male   DOB: 12-Feb-1946, 76 y.o.   MRN: [id number]      Primary M.D.: Dr. Danise Mina  HPI: Evan Avery is a 76 y.o. male who presents today for 27 month cardiology evaluation and f/u of his recent August 05, 2022 cardiac arrest.  Mr. Evan Avery has known CAD and has a total of 4 stents in his RCA. In 1992, he underwent initial PTCA of his RCA. Subsequently, he underwent stenting of the proximal and mid RCA.  In 1999 his fourth stent was placed in the mid RCA between the 2 previously placed stents and was a 3.5x28 mm non-DES [place] stent. His last nuclear perfusion study in August 2012 showed inferior thinning without significant ischemia.  EF was not calculated secondary to  ectopy but previously had been 52%.  Additional problems include hypertension, hyperlipidemia, obesity, and obstructive sleep apnea for which he is he uses CPAP 100% of the time and uses  [employer] for his [employer].  Since I last saw him in May 2017.  He denied any recurrent episodes of chest pain or significant dyspnea.  He had a new CPAP machine and continue to be compliant.  He established primary care with Dr. Danise Mina.  Review of laboratory that he had with him in December 2016 has shown a cholesterol of 149, HDL 27, but his triglycerides were 413.  Remotely he had triglyceride elevations as high as 584.  In December, his hemoglobin A1c was 7.7.  He underwent surgery in July 2017 due to an abscess in his colon.  He required a colostomy for 6 months and in January 2018 underwent colostomy closure.   I saw him in October 2018 after not having seen him in over 17 months.  Since that evaluation, he has undergone 2 resections for cyst on the back of his neck with recurrent infections.  He recently saw Dr. Brantley Stage consideration of surgical excision.  He has felt well with reference to chest pain or shortness of breath.  He had smoked for many years but quit smoking for 10 years but  unfortunately resumed smoking in June 2018 and currently smoking 1 pack/day.  He has not had a nuclear perfusion study since 2012.  He continues to use CPAP with 100% compliance.  He was  started on Januvia for his diabetes mellitus by his primary physician.    When I saw hime in June 2019  he was in need of back and neck surgery.  I recommended he undergo a [place] study for preoperative assessment.  This was done on April 23, 2018 which showed a small inferior defect, low risk.  EF was 50% with basal inferior hypokinesis.  He tolerated his neck and back surgery well.  Is only he is without anginal symptoms.  He has a documented umbilical/ventral  hernia and has been without abdominal discomfort.  He continues to use CPAP with 100% compliance.  He received a new CPAP machine at the [facility] is a ResMed AirSense 10.  He gets his supplies from the [place].    I saw him in February 2020 and  saw him in January 2021.  He denied any chest pain  with walking but does admit to some mild shortness of breath.  Unfortunately he still smokes 1 pack of cigarettes per day.  He had experienced some throat burning which had improved with pantoprazole.  He has an umbilical hernia and ventral hernia but denies any pain.  He continues to be on aspirin and Plavix for DAPT.  He is on atorvastatin 4m,  Zetia 10 mg and Lovaza 2 capsules twice daily for mixed hyperlipidemia.  He continues to be on lisinopril 5 mg and atenolol 50 mg in the morning and 25 mg in the evening for hypertension.  There are no palpitations.  He is diabetic on glimepiride and Metformin.  He was bradycardic but was entirely asymptomatic.  I saw him in July 2021 at that time he stated that he had experienced  several episodes of chest heaviness.  His episodes were short-lived times he would take Tums with benefit.  Unfortunately he still smokes cigarettes.  He has been drinking an excessive amount of coffee typically at least 6 cups/day.  There was  some dyspepsia and he recently started taking Pepcid with some improvement.  He apparently is need to undergo [facility] [place] [facility] [place] [facility] during that evaluation, I again discussed the importance of smoking cessation.  With his longstanding history of CAD, and recent chest discomfort I recommended he undergo 2-year follow-up Lexiscan Myoview for further evaluation.    He underwent a LUgandaMyoview study on May 11, 2020.  There was evidence for small size mild fixed mid inferior perfusion defect most likely scar.  There was no associated ischemia.  EF was 43% with basal to mid inferior hypokinesis.  There was no change from the prior study in 2019.  I saw him on June 21, 2020 at which time he felt improved.  He denied any recurrent chest pain or exertional symptoms.  He was trying to significantly reduce his tobacco which previously had been 1 to 2 packs/day and most recently was 1 pack/day.  During that evaluation, his ECG showed marked sinus bradycardia at 43 bpm.  I recommended he reduce his a atenolol to 25 mg twice a day.  I saw him on December 20, 2020 and since his prior evaluation he had remained cardiovascularly stable.    He has issues with GERD and is now on Pepcid 40 mg twice daily followed by Dr. JArdis Hughsof GI.  He continues to use CP[facility] which [facility] [place] [facility]  Unfortunately he is still smoking 1 pack/day.  He will be undergoing repeat laboratory in 2 weeks with Dr. GDanise Mina  He denied any recurrent chest pain PND orthopnea, palpitations, presyncope or syncope.  I last saw him on J01/25/23  Unfortunately his son died at 430in A2022/05/06  He had significant health related issues prior to his death.  Mr. KMccarterdenies any chest pain.  He is still smoking approximately 1 pack/day.  He continues to use CPAP 100% compliance.  He continues to have issues with GERD and is on therapy.  His last stress test was done in July 2021 which showed a nuclear EF at 43%.  There was  evidence for fixed mid inferior perfusion defect most likely scar.  At times he does note a vague chest sensation which he feels may be his GERD, but at times he did experience some left arm radiation.  He denies presyncope or syncope.  He is unaware of palpitations.  At times he has experienced some mild ankle.  His ECG showed sinus bradycardia at 43 bpm and I recommended he reduce atenolol from 25 mg twice a day to 25 mg in the morning and 12.5 mg at night.  I also empirically added isosorbide 30 mg to his medical regimen to provide potential  anti-ischemic benefit.  On August 06, 2022 he suffered a witnessed cardiac arrest at home.  His wife was there and a bystander CPR was initiated.  EMS arrived and he received 6 shocks for VF before ROSC.  He was intubated.  He underwent cardiac catheterization by Dr. Tamala Julian and was found to have 99% mid RCA stenosis just above the previously placed bare-metal stent.  There were left-to-right collaterals.  The RCA was heavily calcified with the proximal segment having 50% stenosis.  He underwent PCI with overlapping synergy DES 3.5 x 28 mm stent inserted postdilated to 3.75 mm.  The 99% lesion was reduced to 0 but there was persistent underexpansion of approximately 50% in the more proximal segment.  Acute EF was estimated at 30 to 35% with severe hypokinesis to akinesis in the inferior wall basal to apical.  On August 07, 2022 echo Doppler study showed EF now at 45 to 50% with inferior hypokinesis.  There was mild biatrial dilation, mild mitral annular calcification as well as mild aortic calcification with aortic sclerosis without stenosis.  He was ultimately discharged on August 10, 2022 on aspirin/Plavix, Jardiance 10 mg, Entresto 24/26 mL twice a day, furosemide 20 mg to take as needed in addition to atorvastatin 80 mg, Zetia 10 mg and Lovaza 2 capsules twice a day, metformin and glimepiride for diabetes, and Neurontin.  He was evaluated on August 23, 2022 in the  transition of care clinic heart failure pharmacy encounter.  In samples were provided.  Presently, he feels well.  He denies any recurrent chest pain or shortness of breath, PND orthopnea, presyncope or syncope.  He is unaware of palpitations.  He continues to use CPAP which is managed by the [place] and admits to excellent compliance.  He presents for cardiology evaluation.   Past Medical History:  Diagnosis Date   Agent orange exposure 1968   Arthritis    CAD (coronary artery disease)    4 stents in right artery   Complex tear of medial meniscus of left knee as current injury 07/26/2012   [place]   Diabetes mellitus    type 2   Diverticulitis of colon with perforation 05/23/2016   DVT (deep venous thrombosis) (HCC)    GERD (gastroesophageal reflux disease)    Glaucoma 2006   [facility]   Hx of colonic polyps    Hyperlipidemia    Hypertension    Intra-abdominal abscess ([place]) 05/16/2016   LUMBAR RADICULOPATHY 07/18/2007   Qualifier: Diagnosis of  By: Arnoldo Morale MD, John E    Myocardial infarction [facility])    1992   Sleep apnea    uses CPAP regularly    Past Surgical History:  Procedure Laterality Date   [place] N/A 05/23/2016   Procedure: SMALL BOWEL RESECTION;  Surgeon: Jackolyn Confer, MD;  Location: [facility];  Service: General;  Laterality: N/A;   CARDIAC CATHETERIZATION  08/11/2008   angiographically patent LAD diagonal, circumflex and multiple circumflex obtuse marginal branches from the L coronary artery; stenting of mid R coronary artery with 3.5 x 15m Liberte non DES postdilated w/ 4.0 DuraStent ([name]   CARDIOVASCULAR STRESS TEST  06/16/2011   R/P MV - moderate perfusion defect in basal inferoseptal, basal inferior, mid inferseptal and mid inferior regions, consistent with infarct/scar and/or diaphragmatic attenuation; non-gated study secondary to ectopy; no CP or EKG changes for ischemia; abnormal study although no significant changes from  previous study; in the absense of gated images  cannot calculate EF or distinguish scar/artifact   COLONOSCOPY  08/2016   TAx5, sessile serrated polyp, diverticulosis, rpt 2 yrs ([place])   COLONOSCOPY  10/2020   1.5cm SSP, HP, rpt [facility])   COLONOSCOPY  08/2020   multiple polpys removed (TA, HP), 1.5cm polyp biopsied (SSP), colon lipoma Ardis Hughs)   COLONOSCOPY WITH PROPOFOL N/A 11/11/2020   Procedure: COLONOSCOPY WITH PROPOFOL;  Surgeon: Milus Banister, MD;  [facility];  Service: Endoscopy;  Laterality: N/A;   COLOSTOMY Left 05/23/2016   Procedure: COLOSTOMY;  Surgeon: Jackolyn Confer, MD;  [facility]: [facility];  Service: General;  Laterality: Left;   COLOSTOMY TAKEDOWN N/A 11/10/2016   Procedure: LAPAROSCOPIC LYSIS OF ADHESIONS AND  COLOSTOMY CLOSURE;  Surgeon: Jackolyn Confer, MD;  [facility];  Service: General;  Laterality: N/A;   CORONARY/GRAFT ACUTE MI REVASCULARIZATION N/A 08/06/2022   Procedure: Coronary/Graft Acute MI Revascularization;  Surgeon: Belva Crome, MD;  Location: [place] [facility];  Service: Cardiovascular;  Laterality: N/A;   CYST EXCISION  04/2018   posterior neck ([place])   DOPPLER ECHOCARDIOGRAPHY  08/20/2008   EF >29%; LV systolic function normal; LV mildly dilated; doppler flow suggestive of impaired LV relaxation; RV mildly dilated, RV systolic function normal, RV systolic pressure normal   ESOPHAGOGASTRODUODENOSCOPY  08/2020   gastritis, duodenitis likely NSAID/ASA related, H pylori neg - rec stop aspirin Ardis Hughs)   INCISION AND DRAINAGE ABSCESS N/A 05/23/2016   Procedure: DRAINAGE OF MULTIPLE INTRA  ABDOMINAL ABSCESS;  Surgeon: Jackolyn Confer, MD;  [facility];  Service: General;  Laterality: N/A;   KNEE SURGERY Left 2015   torn meniscus [facility])   LAPAROSCOPIC LYSIS OF ADHESIONS  11/10/2016   Procedure: LAPAROSCOPIC LYSIS OF ADHESIONS;  Surgeon: Jackolyn Confer, MD;  [facility];  Service: General;;   LAPAROTOMY N/A 05/23/2016    Procedure: EXPLORATORY LAPAROTOMY;  Surgeon: Jackolyn Confer, MD;  [facility];  Service: General;  Laterality: N/A;   LEFT HEART CATH AND CORONARY ANGIOGRAPHY N/A 08/06/2022   Procedure: LEFT HEART CATH AND CORONARY ANGIOGRAPHY;  Surgeon: Belva Crome, MD;  Location: [place] [facility];  Service: Cardiovascular;  Laterality: N/A;   MASS EXCISION N/A 05/22/2018   Procedure: EXCISION OF CYST ON POSTERIOR NECK ERAS PATHWAY;  Surgeon: Erroll Luna, MD;  Location: [place];  Service: General;  Laterality: N/A;   PERCUTANEOUS CORONARY STENT INTERVENTION (PCI-S)  1992, 1999, 2004   4 vessels [name])   POLYPECTOMY  11/11/2020   Procedure: POLYPECTOMY;  Surgeon: Milus Banister, MD;  [facility];  Service: Endoscopy;;   PROCTOSCOPY  11/10/2016   Procedure: PROCTOSCOPY;  Surgeon: Jackolyn Confer, MD;  [facility];  Service: General;;   SUBMUCOSAL LIFTING INJECTION  11/11/2020   Procedure: SUBMUCOSAL LIFTING INJECTION;  Surgeon: Milus Banister, MD;  Location: [facility];  Service: Endoscopy;;    Allergies  Allergen Reactions   Dilaudid [Hydromorphone Hcl] Itching   Ambien [Zolpidem] Other (See Comments)    Pt states "makes me wild"   Niacin And Related Other (See Comments)    Extreme fatigue    Current Outpatient Medications  Medication Sig Dispense Refill   aspirin EC 81 MG tablet Take 1 tablet (81 mg total) by mouth daily. Swallow whole. 90 tablet 3   atorvastatin (LIPITOR) 80 MG tablet Take 1 tablet (80 mg total) by mouth daily. 90 tablet 3   Brinzolamide-Brimonidine 1-0.2 % SUSP Apply 1 drop to eye 3 (three) times daily.  cetirizine (ZYRTEC) 10 MG tablet Take 10 mg by mouth daily as needed for allergies.     clopidogrel (PLAVIX) 75 MG tablet Take 1 tablet by mouth once daily 90 tablet 1   Coenzyme Q10 (COQ10) 200 MG CAPS Take 200 mg by mouth daily.     empagliflozin (JARDIANCE) 10 MG TABS tablet Take 1 tablet (10 mg total) by mouth daily  before breakfast. 90 tablet 3   ezetimibe (ZETIA) 10 MG tablet Take 1 tablet (10 mg total) by mouth daily. 90 tablet 3   famotidine (PEPCID) 20 MG tablet Take 20 mg by mouth daily as needed for heartburn or indigestion.     fluticasone (FLONASE) 50 MCG/ACT nasal spray Place 2 sprays into both nostrils daily. 16 g 1   furosemide (LASIX) 20 MG tablet Take 20 mg by mouth daily.     gabapentin (NEURONTIN) 100 MG capsule Take 100 mg by mouth 3 (three) times daily.     glimepiride (AMARYL) 2 MG tablet Take 1 tablet (2 mg total) by mouth daily before breakfast. 90 tablet 3   latanoprost (XALATAN) 0.005 % ophthalmic solution Place 1 drop into both eyes at bedtime.     metFORMIN (GLUCOPHAGE) 1000 MG tablet TAKE 1 TABLET BY MOUTH TWICE DAILY WITH MEALS (Patient taking differently: Take 1,000 mg by mouth 2 (two) times daily with a meal.) 180 tablet 0   metoprolol succinate (TOPROL XL) 25 MG 24 hr tablet Take 0.5 tablets (12.5 mg total) by mouth daily. 45 tablet 3   nitroGLYCERIN (NITROSTAT) 0.4 MG SL tablet For chest pain, tightness, or pressure. While sitting, place 1 tablet under tongue. May be used every 5 minutes as needed, for up to 15 minutes. Do not use more than 3 tablets. 25 tablet 3   omega-3 acid ethyl esters (LOVAZA) 1 g capsule Take 2 capsules by mouth twice daily (Patient taking differently: Take 2 g by mouth 2 (two) times daily.) 360 capsule 0   sacubitril-valsartan (ENTRESTO) 49-51 MG Take 1 tablet by mouth 2 (two) times daily. 180 tablet 3   senna-docusate (SENOKOT-S) 8.6-50 MG tablet Take 1 tablet by mouth at bedtime as needed for mild constipation. 15 tablet 0   TURMERIC CURCUMIN PO Take 1 capsule by mouth daily.     No current facility-administered medications for this visit.    Socially he is married has 2 children 2 grandchildren. He does walk several times a week. He does have a tobacco history. There is no alcohol use.   ROS General: Negative; No fevers, chills, or night sweats;  positive for obesity. HEENT: Negative; No changes in vision or hearing, sinus congestion, difficulty swallowing Pulmonary: Negative; No cough, wheezing, shortness of breath, hemoptysis Cardiovascular: See HPI GI: Colonic abscess requiring colostomy, and ultimate closure; positive for dyspepsia GU: Negative; No dysuria, hematuria, or difficulty voiding Musculoskeletal: He is status post left knee surgery; no myalgias, joint pain, or weakness Hematologic/Oncology: Negative; no easy bruising, bleeding Endocrine: Negative; no heat/cold intolerance; no diabetes Neuro: Negative; no changes in balance, headaches Skin: Recurrent cysts in the back of his neck Psychiatric: Negative; No behavioral problems, depression Sleep: Positive for sleep apnea, on CPAP with 100% compliance. No snoring, daytime sleepiness, hypersomnolence, bruxism, restless legs, hypnogognic hallucinations, no cataplexy Other comprehensive 14 point system review is negative.   PE BP (!) 104/52 (BP Location: Right Arm, Patient Position: Sitting, Cuff Size: Normal)   Pulse 73   Ht 5' 7.5" (1.715 m)   Wt 219 lb (99.3 kg)  BMI 33.79 kg/m    Repeat blood pressure by me was 114/60  Wt Readings from Last 3 Encounters:  09/07/22 219 lb (99.3 kg)  08/29/22 217 lb (98.4 kg)  08/23/22 228 lb 12.8 oz (103.8 kg)   General: Alert, oriented, no distress.  Skin: normal turgor, no rashes, warm and dry HEENT: Normocephalic, atraumatic. Pupils equal round and reactive to light; sclera anicteric; extraocular muscles intact;  Nose without nasal septal hypertrophy Mouth/Parynx benign; Mallinpatti scale 3 Neck: No JVD, no carotid bruits; normal carotid upstroke Lungs: clear to ausculatation and percussion; no wheezing or rales Chest wall: without tenderness to palpitation Heart: PMI not displaced, RRR, s1 s2 normal, 1/6 systolic murmur, no diastolic murmur, no rubs, gallops, thrills, or heaves Abdomen: Previously noted abdominal hernia;  soft, nontender; no hepatosplenomehaly, BS+; abdominal aorta nontender and not dilated by palpation. Back: no CVA tenderness Pulses 2+ Musculoskeletal: full range of motion, normal strength, no joint deformities Extremities: no clubbing cyanosis or edema, Homan's sign negative  Neurologic: grossly nonfocal; Cranial nerves grossly wnl Psychologic: Normal mood and affect  September 07, 2022 ECG (independently read by me): NSR at 73, No STT changes  October 25, 2021 ECG (independently read by me):  Sinus bradycardia at 43; mild T wave abnormality  December 20, 2020 ECG (independently read by me): Sinus bradycardia at 49  August 2021 ECG (independently read by me): Sinus Bradycardia at 43; no ectopy; normal intervals  July 2021ECG (independently read by me): Sinus bradycardia at 43 bpm.  No significant ST-T changes.  Normal intervals.  No ectopy  November 10, 2019 ECG (independently read by me):Sinus bradycardia at 46 bpm.  Nonspecific T changes.  Normal intervals.  No ectopy  February 2020 ECG (independently read by me): Sinus Bradycardioa 51; Norml intervals  June 2018 ECG (independently read by me): Sinus bradycardia 57 bpm.  No ectopy.  Normal intervals.  October 2018 ECG (independently read by me): Normal sinus rhythm with PAC.  PR interval 168 ms.  QTc interval 454 ms.  May 2017 ECG (independently read by me): Sinus bradycardia 52 bpm.  T-wave abnormality in leads 3 and aVF.  May 2016 ECG (independently read by me): Sinus bradycardia 53 bpm.  Nondiagnostic T changes with T-wave inversion in lead 3.  June 2015 ECG (independently read by me):  sinus bradycardia at 55 QTc interval 447 ms.  PR interval 174 ms.  Nondiagnostic T changes in lead 3.  ECG: Sinus rhythm at 56 beats per minute. No ectopy. Normal intervals; nonspecific T changes in lead 3.  LABS:    Latest Ref Rng & Units 08/29/2022   11:43 AM 08/23/2022   10:36 AM 08/16/2022   10:55 AM  BMP  Glucose 70 - 99 mg/dL 119  145   131   BUN 8 - 23 mg/dL _0 Creatinine 0.61 - 1.24 mg/dL 1.26  1.07  1.09   BUN/Creat Ratio 10 - 24   15   Sodium 135 - 145 mmol/L 141  142  142   Potassium 3.5 - 5.1 mmol/L 4.3  3.9  3.9   Chloride 98 - 111 mmol/L 100  105  101   CO2 22 - 32 mmol/L _1 Calcium 8.9 - 10.3 mg/dL 9.9  9.4  9.4       Latest Ref Rng & Units 08/07/2022    5:23 PM 08/07/2022    5:09 AM 08/06/2022    5:30 PM  Hepatic  Function  Total Protein 6.5 - 8.1 g/dL 5.5  5.1  5.7   Albumin 3.5 - 5.0 g/dL 3.1  2.9  3.4   AST 15 - 41 U/L 204  248  237   ALT 0 - 44 U/L 134  126  85   Alk Phosphatase 38 - 126 U/L 42  43  51   Total Bilirubin 0.3 - 1.2 mg/dL 0.8  0.9  0.7        Latest Ref Rng & Units 08/23/2022   10:36 AM 08/09/2022    5:07 AM 08/08/2022    4:23 AM  CBC  WBC 4.0 - 10.5 K/uL 5.9  7.1  8.9   Hemoglobin 13.0 - 17.0 g/dL 12.8  11.3  11.4   Hematocrit 39.0 - 52.0 % 39.1  34.4  34.1   Platelets 150 - 400 K/uL 228  82  81    Lab Results  Component Value Date   TSH 0.90 03/12/2020   Lab Results  Component Value Date   HGBA1C 8.8 (H) 05/30/2022   Lipid Panel     Component Value Date/Time   CHOL 91 05/30/2022 0726   CHOL 149 03/13/2016 0803   TRIG 185.0 (H) 05/30/2022 0726   TRIG 387 (H) 03/13/2016 0803   HDL 33.90 (L) 05/30/2022 0726   HDL 26 (L) 03/13/2016 0803   CHOLHDL 3 05/30/2022 0726   VLDL 37.0 05/30/2022 0726   LDLCALC 20 05/30/2022 0726   LDLCALC 46 03/13/2016 0803   LDLDIRECT 61.0 12/27/2018 1245    RADIOLOGY: No results found.  [place] [facility] Myoview 05/11/2020 Study Highlights    The left ventricular ejection fraction is moderately decreased (30-44%). Nuclear stress EF: 43%. No T wave inversion was noted during stress. There was no ST segment deviation noted during stress. Defect 1: There is a small defect of mild severity present in the mid inferior location. Findings consistent with prior myocardial infarction. This is an intermediate risk study.    Small size, mild severity fixed mid inferior perfusion defect, likely scar. LVEF 43% with basal to mid inferior hypokinesis. This is an intermediate risk study. Compared to a prior study in 2019, the LVEF is calculated lower, but appears visually higher - the perfusion defect appears unchanged.    CATH/PCI: 08/06/2022 CONCLUSIONS: Cardiac arrest in the setting of ACS identified that the mid RCA above a previously placed stent is 99% stenosis with TIMI grade II flow and recruited left-to-right collaterals distally.  The vessel is heavily calcified and the entire proximal segment is segmentally 50% narrowed. The culprit lesion was dilated and then stented with a 28 x 3.5 mm Synergy postdilated using high-pressure with a 3.75 x 22 Fortine balloon to 17 atm x 2.  TIMI grade III flow established.  0% stenosis was noted at the site of the 99% stenosis but persistent 50% underexpansion was noted proximal to the culprit. Left main is widely patent LAD is widely patent Circumflex is widely patent Inferior wall to the apex is severely hypokinetic to akinetic.  EDP is 17.  EF is 30 to 35%.   RECOMMENDATIONS:   Unknown if patient took Plavix this morning.  Cangrelor started in Cath Lab.  Once NG tube is in place cangrelor will be discontinued and 300 mg of clopidogrel given per NG tube. Management of ventilator and postcardiac arrest state by critical care medicine.  Intention is for patient to be on aspirin and Plavix. Sheath will be pulled according to protocol when ACT/Angiomax are at target.  Intervention       IMPRESSION:  1. Coronary artery disease involving native coronary artery of native heart with unstable angina pectoris ([place])   2. Ischemic cardiomyopathy   3. OSA on CPAP   4. Hyperlipidemia with target LDL less than 70   5. Gastroesophageal reflux disease without esophagitis   6. Type 2 diabetes mellitus with complication, without long-term current use of insulin ([place])      ASSESSMENT AND PLAN: Mr. Mota  is a 76 year old Caucasian male who has CAD and underwent initial PTCA to his RCA in 1992.  At  catheterization in October 2009 a fourth stent was placed in his RCA.   He continues to be on dual antiplatelet therapy with aspirin and Plavix with his multiple stents.  A preoperative Lexiscan Myoview study in July 2019 prior to his back surgery was low risk and showed a small inferior defect with EF 50%.  When I saw him in July 2021 he had developed nonexertional episodes of chest pressure which were short-lived and often improved with antacid therapy. Symptoms would typically last a minute or 2.  He was started on Pepcid with improvement.  At the time he was drinking over 6 cups of coffee per day which undoubtedly contributed to his dyspeptic symptomatology.  His [place] study was unchanged from 2019 and showed mild severity fixed mid inferior perfusion defect most likely scar.  Nuclear EF was 43% but visually it appeared higher.  When I saw him in January 2023, he was experiencing some reflux symptomatology but at times has also experienced some vague left arm sensation.  Due to sinus bradycardia with heart rate at 43 his atenolol dose was reduced from 25 twice a day to 25 mg in the morning and 12.5 mg at night.  Isosorbide 30 mg was added to his medical regimen and he continued to be on DAPT with aspirin/Plavix, and for hyperlipidemia atorvastatin 40 mg, Lovaza 2 capsules twice a day, and Zetia.  He was diabetic on glimepiride, metformin in addition to pioglitazone.  He was using CPAP with 100% compliance.  He suffered a witnessed cardiac arrest at home on August 06, 2022 and CPR was started by his wife.  When EMS arrived, he required 6 shocks for VF before ROSC.  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   Although acute EF was 30 to 35%, 1 day later EF had improved to 45 to 50%.  He is now on guideline directed medical therapy with Jardiance 10 mg, Entresto 49/51 mg twice a day, furosemide 20 mg daily,in addition to DAPT with aspirin/Plavix.  Presently, she remains stable without chest pain or shortness of breath.  His resting pulse is 73 I have suggested initiation of low-dose metoprolol succinate 12.5 mg initially.  He is on Zetia 10 mg, atorvastatin 80 mg, and Lovaza 2 g twice a day for mixed hyperlipidemia.  He is diabetic on glimepiride and metformin.  He is on gabapentin for neuropathy.  He is continue to use CPAP therapy and admits to 100% compliance which is followed at the [place].  He has lost some weight since his prior evaluation but BMI is still increased at 33.8 consistent with mild obesity.  I will see him in  3 months for follow-up evaluation.   Troy Sine, MD, [facility]  09/13/2022 11:37 AM

## 2022-09-13 ENCOUNTER — Encounter: Payer: Self-pay | Admitting: Cardiovascular Disease

## 2022-10-02 ENCOUNTER — Other Ambulatory Visit: Payer: Self-pay | Admitting: Family Medicine

## 2022-10-02 DIAGNOSIS — E1169 Type 2 diabetes mellitus with other specified complication: Secondary | ICD-10-CM

## 2022-10-02 NOTE — Telephone Encounter (Signed)
Plavix Last filled:  07/04/22, #90 Last OV:  06/06/22, CPE Next OV:  10/06/22, 4 mo f/u

## 2022-10-03 NOTE — Telephone Encounter (Signed)
ERx 

## 2022-10-06 ENCOUNTER — Ambulatory Visit (INDEPENDENT_AMBULATORY_CARE_PROVIDER_SITE_OTHER): Payer: HMO | Admitting: Family Medicine

## 2022-10-06 ENCOUNTER — Encounter: Payer: Self-pay | Admitting: Family Medicine

## 2022-10-06 VITALS — BP 118/60 | HR 56 | Temp 97.8°F | Ht 67.5 in | Wt 228.4 lb

## 2022-10-06 DIAGNOSIS — H5789 Other specified disorders of eye and adnexa: Secondary | ICD-10-CM

## 2022-10-06 DIAGNOSIS — F17200 Nicotine dependence, unspecified, uncomplicated: Secondary | ICD-10-CM

## 2022-10-06 DIAGNOSIS — H401132 Primary open-angle glaucoma, bilateral, moderate stage: Secondary | ICD-10-CM | POA: Diagnosis not present

## 2022-10-06 DIAGNOSIS — E1169 Type 2 diabetes mellitus with other specified complication: Secondary | ICD-10-CM

## 2022-10-06 DIAGNOSIS — Z955 Presence of coronary angioplasty implant and graft: Secondary | ICD-10-CM

## 2022-10-06 DIAGNOSIS — L602 Onychogryphosis: Secondary | ICD-10-CM

## 2022-10-06 LAB — POCT GLYCOSYLATED HEMOGLOBIN (HGB A1C): Hemoglobin A1C: 7.4 % — AB (ref 4.0–5.6)

## 2022-10-06 MED ORDER — CETIRIZINE HCL 10 MG PO TABS: 10.0000 mg | ORAL_TABLET | Freq: Every day | ORAL | 3 refills | Status: DC

## 2022-10-06 NOTE — Patient Instructions (Addendum)
Vision screen today  Continue restasis and artifical tears eye drops. Continue daily zyrtec (cetirizine). If this doesn't help with eye allergies, could try over the counter patanol antihistamine eye drops. If not improving, schedule follow up with eye doctor.  A1c down to 7.4% - continue current diabetes medicines. Good to see you today.

## 2022-10-06 NOTE — Progress Notes (Unsigned)
Patient ID: Evan Avery, male    DOB: [birth year], 76 y.o.   MRN: [id number]  This visit was conducted in person.  BP 118/60   Pulse (!) 56   Temp 97.8 F (36.6 C) (Temporal)   Ht 5' 7.5" (1.715 m)   Wt 228 lb 6.4 oz (103.6 kg)   BMI 35.24 kg/m   No results found.   CC: 4 mo DM f/u visit  Subjective:   HPI: Evan Avery is a 76 y.o. male presenting on 10/06/2022 for Follow-up (Here for 4 mo f/u.)   Since last seen here was hospitalized 07/2022 after cardiac arrest with successful resuscitation and ICU stay. New stent placed to RCA. Last saw cardiology Dr [name] 08/2022 - started on metoprolol, rec 3 mo f/u.   He quit smoking cold [place] 08/06/2022! - after latest cardiac arrest.   Notes ongoing redness and itching to bilateral eyes - in known severe open angle glaucoma - takes brimonidine/brinzolamide and latanoprost drops for glaucoma. He continues zyrtec. Per eye doctor Dr Posey Pronto he should be on restasis and artificial tears but hasn't been taking these. Notes occ blurry vision. No orbital pain or pain with eye movement.   DM - does not regularly check sugars. Compliant with antihyperglycemic regimen which includes: jardiance '10mg'$  daily, metformin '1000mg'$  bid, glimepiride '2mg'$  daily. Most recently actos changed to jardiance. Denies low sugars or hypoglycemic symptoms. Denies paresthesias, blurry vision. Last diabetic eye exam 12/2021. Glucometer brand: unsure. Last foot exam: 12/2021. DSME: previously declined. Lab Results  Component Value Date   HGBA1C 7.4 (A) 10/06/2022   Diabetic Foot Exam - Simple   Simple Foot Form Diabetic Foot exam was performed with the following findings: Yes 10/06/2022 10:11 AM  Visual Inspection See comments: Yes Sensation Testing Intact to touch and monofilament testing bilaterally: Yes Pulse Check Posterior Tibialis and Dorsalis pulse intact bilaterally: Yes Comments Thickened elongated nails Dry skin bilateral feet    Lab Results   Component Value Date   MICROALBUR 7.8 (H) 05/30/2022        Relevant past medical, surgical, family and social history reviewed and updated as indicated. Interim medical history since our last visit reviewed. Allergies and medications reviewed and updated. Outpatient Medications Prior to Visit  Medication Sig Dispense Refill   aspirin EC 81 MG tablet Take 1 tablet (81 mg total) by mouth daily. Swallow whole. 90 tablet 3   atorvastatin (LIPITOR) 80 MG tablet Take 1 tablet (80 mg total) by mouth daily. 90 tablet 3   Brinzolamide-Brimonidine 1-0.2 % SUSP Apply 1 drop to eye 3 (three) times daily.      clopidogrel (PLAVIX) 75 MG tablet Take 1 tablet by mouth once daily 90 tablet 1   Coenzyme Q10 (COQ10) 200 MG CAPS Take 200 mg by mouth daily.     empagliflozin (JARDIANCE) 10 MG TABS tablet Take 1 tablet (10 mg total) by mouth daily before breakfast. 90 tablet 3   ezetimibe (ZETIA) 10 MG tablet Take 1 tablet (10 mg total) by mouth daily. 90 tablet 3   famotidine (PEPCID) 20 MG tablet Take 20 mg by mouth daily as needed for heartburn or indigestion.     fluticasone (FLONASE) 50 MCG/ACT nasal spray Place 2 sprays into both nostrils daily. 16 g 1   furosemide (LASIX) 20 MG tablet Take 20 mg by mouth daily.     gabapentin (NEURONTIN) 100 MG capsule Take 100 mg by mouth 3 (three) times daily.     glimepiride (  AMARYL) 2 MG tablet Take 1 tablet (2 mg total) by mouth daily before breakfast. 90 tablet 3   latanoprost (XALATAN) 0.005 % ophthalmic solution Place 1 drop into both eyes at bedtime.     metFORMIN (GLUCOPHAGE) 1000 MG tablet TAKE 1 TABLET BY MOUTH TWICE DAILY WITH MEALS 180 tablet 2   metoprolol succinate (TOPROL XL) 25 MG 24 hr tablet Take 0.5 tablets (12.5 mg total) by mouth daily. 45 tablet 3   nitroGLYCERIN (NITROSTAT) 0.4 MG SL tablet For chest pain, tightness, or pressure. While sitting, place 1 tablet under tongue. May be used every 5 minutes as needed, for up to 15 minutes. Do not use  more than 3 tablets. 25 tablet 3   omega-3 acid ethyl esters (LOVAZA) 1 g capsule Take 2 capsules by mouth twice daily 360 capsule 2   sacubitril-valsartan (ENTRESTO) 49-51 MG Take 1 tablet by mouth 2 (two) times daily. 180 tablet 3   senna-docusate (SENOKOT-S) 8.6-50 MG tablet Take 1 tablet by mouth at bedtime as needed for mild constipation. 15 tablet 0   TURMERIC CURCUMIN PO Take 1 capsule by mouth daily.     cetirizine (ZYRTEC) 10 MG tablet Take 10 mg by mouth daily as needed for allergies.     No facility-administered medications prior to visit.     Per HPI unless specifically indicated in ROS section below Review of Systems  Objective:  BP 118/60   Pulse (!) 56   Temp 97.8 F (36.6 C) (Temporal)   Ht 5' 7.5" (1.715 m)   Wt 228 lb 6.4 oz (103.6 kg)   BMI 35.24 kg/m   Wt Readings from Last 3 Encounters:  10/06/22 228 lb 6.4 oz (103.6 kg)  09/07/22 219 lb (99.3 kg)  08/29/22 217 lb (98.4 kg)      Physical Exam Vitals and nursing note reviewed.  Constitutional:      Appearance: Normal appearance. He is not ill-appearing.  Eyes:     Extraocular Movements: Extraocular movements intact.     Conjunctiva/sclera: Conjunctivae normal.     Pupils: Pupils are equal, round, and reactive to light.  Cardiovascular:     Rate and Rhythm: Normal rate and regular rhythm.     Pulses: Normal pulses.     Heart sounds: Normal heart sounds. No murmur heard. Pulmonary:     Effort: Pulmonary effort is normal. No respiratory distress.     Breath sounds: Normal breath sounds. No wheezing, rhonchi or rales.  Musculoskeletal:     Right lower leg: No edema.     Left lower leg: No edema.     Comments: See HPI for foot exam if done  Skin:    General: Skin is warm and dry.     Findings: No rash.  Neurological:     Mental Status: He is alert.  Psychiatric:        Mood and Affect: Mood normal.        Behavior: Behavior normal.       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  He is motivated to continue diabetic diet, will also continue metformin, jardiance, and amaryl.       Relevant Orders   POCT glycosylated hemoglobin (Hb A1C) (Completed)   Smoker (Chronic)    Quit again 07/2022 after cardiac arrest - quit cold [place]. Congratulated on smoking cessation.       Primary open angle glaucoma (POAG) of both eyes, moderate stage    H/o this, followed by [facility] eye doctor, on latanoprost and brimonidine/brinzolamide eye drops.  In h/o open angle glaucoma, try antihistamine eye drops. Advised f/u eye doctor if no improvement.       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  Appreciate cardiology care.       Red eyes    Ongoing, he has seen eye doctor for this who recommended restasis and artificial tears for dry eyes but hasn't been taking this - advised to start taking, if no improvement may try OTC antihistamine drops (patanol) and if not improved, rec return to eye doctor. Pt agrees.       Thickened nails    In diabetic offered podiatry eval.         Meds ordered this encounter  Medications   cetirizine (ZYRTEC) 10 MG tablet    Sig: Take 1 tablet (10 mg total) by mouth daily.    Dispense:  90 tablet    Refill:  3   Orders Placed This  Encounter  Procedures   POCT glycosylated hemoglobin (Hb A1C)     Patient Instructions  Continue restasis and artifical tears eye drops. Continue daily zyrtec (cetirizine). If this doesn't help with eye allergies, could try over the counter patanol antihistamine eye drops. If not improving, schedule follow up with eye doctor.  A1c down to 7.4% - continue current diabetes medicines. Good to see you today.   Follow up plan: Return in about 4 months (around 02/05/2023) for follow up visit.  [name], MD

## 2022-10-07 ENCOUNTER — Encounter: Payer: Self-pay | Admitting: Family Medicine

## 2022-10-07 DIAGNOSIS — H5789 Other specified disorders of eye and adnexa: Secondary | ICD-10-CM | POA: Insufficient documentation

## 2022-10-07 DIAGNOSIS — L602 Onychogryphosis: Secondary | ICD-10-CM | POA: Insufficient documentation

## 2022-10-07 NOTE — Assessment & Plan Note (Signed)
In diabetic offered podiatry eval.

## 2022-10-07 NOTE — Assessment & Plan Note (Addendum)
Ongoing, he has seen eye doctor for this who recommended restasis and artificial tears for dry eyes but hasn't been taking this - advised to start taking, if no improvement may try OTC antihistamine drops (patanol) and if not improved, rec return to eye doctor. Pt agrees.

## 2022-10-07 NOTE — Assessment & Plan Note (Addendum)
New stent placed 07/2022 after cardiac arrest, survived community resuscitation.  Appreciate cardiology care.

## 2022-10-07 NOTE — Assessment & Plan Note (Signed)
Chronic, improvement noted with A1c from 8.8% to 7.4% - recommend goal A1c <7%.  He is motivated to continue diabetic diet, will also continue metformin, jardiance, and amaryl.

## 2022-10-07 NOTE — Assessment & Plan Note (Signed)
H/o this, followed by [facility] eye doctor, on latanoprost and brimonidine/brinzolamide eye drops.  In h/o open angle glaucoma, try antihistamine eye drops. Advised f/u eye doctor if no improvement.

## 2022-10-07 NOTE — Assessment & Plan Note (Signed)
Quit again 07/2022 after cardiac arrest - quit cold [place]. Congratulated on smoking cessation.

## 2022-11-21 ENCOUNTER — Telehealth: Payer: Self-pay | Admitting: Family Medicine

## 2022-11-21 NOTE — Telephone Encounter (Signed)
LVM for pt to rtn my call to schedule AWV with [facility].  

## 2022-11-23 ENCOUNTER — Encounter: Payer: No Typology Code available for payment source | Admitting: Pharmacist

## 2022-11-28 ENCOUNTER — Ambulatory Visit
Payer: No Typology Code available for payment source | Attending: Cardiovascular Disease | Admitting: Cardiovascular Disease

## 2022-11-28 ENCOUNTER — Encounter: Payer: Self-pay | Admitting: Cardiovascular Disease

## 2022-11-28 VITALS — BP 120/56 | HR 63 | Ht 68.0 in | Wt 235.8 lb

## 2022-11-28 DIAGNOSIS — G4733 Obstructive sleep apnea (adult) (pediatric): Secondary | ICD-10-CM

## 2022-11-28 DIAGNOSIS — E118 Type 2 diabetes mellitus with unspecified complications: Secondary | ICD-10-CM | POA: Diagnosis not present

## 2022-11-28 DIAGNOSIS — I25119 Atherosclerotic heart disease of native coronary artery with unspecified angina pectoris: Secondary | ICD-10-CM

## 2022-11-28 DIAGNOSIS — E785 Hyperlipidemia, unspecified: Secondary | ICD-10-CM

## 2022-11-28 DIAGNOSIS — I255 Ischemic cardiomyopathy: Secondary | ICD-10-CM | POA: Diagnosis not present

## 2022-11-28 MED ORDER — ATORVASTATIN CALCIUM 80 MG PO TABS: 80.0000 mg | ORAL_TABLET | Freq: Every day | ORAL | 3 refills | Status: DC

## 2022-11-28 MED ORDER — FUROSEMIDE 20 MG PO TABS: 20.0000 mg | ORAL_TABLET | Freq: Every day | ORAL | 3 refills | Status: DC

## 2022-11-28 NOTE — Patient Instructions (Signed)
Medication Instructions:  The current medical regimen is effective;  continue present plan and medications.  *If you need a refill on your cardiac medications before your next appointment, please call your pharmacy*   Testing/Procedures: Echocardiogram (MAY 2024) - Your physician has requested that you have an echocardiogram. Echocardiography is a painless test that uses sound waves to create images of your heart. It provides your doctor with information about the size and shape of your heart and how well your heart's chambers and valves are working. This procedure takes approximately one hour. There are no restrictions for this procedure.     Follow-Up: At [facility], you and your health needs are our priority.  As part of our continuing mission to provide you with exceptional heart care, we have created designated Provider Care Teams.  These Care Teams include your primary Cardiologist (physician) and Advanced Practice Providers (APPs -  Physician Assistants and Nurse Practitioners) who all work together to provide you with the care you need, when you need it.  We recommend signing up for the patient portal called "MyChart".  Sign up information is provided on this After Visit Summary.  MyChart is used to connect with patients for Virtual Visits (Telemedicine).  Patients are able to view lab/test results, encounter notes, upcoming appointments, etc.  Non-urgent messages can be sent to your provider as well.   To learn more about what you can do with MyChart, go to [email or web address].    Your next appointment:   4 month(s)  Provider:   Shelva Majestic, MD

## 2022-11-28 NOTE — Progress Notes (Signed)
Patient ID: Evan Avery, male   DOB: [birth year], 77 y.o.   MRN: [id number]      Primary M.D.: Dr. Danise Mina  HPI: Evan Avery is a 77 y.o. male who presents today for a 3 month cardiology evaluation and f/u of his recent August 05, 2022 cardiac arrest.  Mr. Buczynski has known CAD and has a total of 4 stents in his RCA. In 1992, he underwent initial PTCA of his RCA. Subsequently, he underwent stenting of the proximal and mid RCA.  In 1999 his fourth stent was placed in the mid RCA between the 2 previously placed stents and was a 3.5x28 mm non-DES [place] stent. His last nuclear perfusion study in August 2012 showed inferior thinning without significant ischemia.  EF was not calculated secondary to  ectopy but previously had been 52%.  Additional problems include hypertension, hyperlipidemia, obesity, and obstructive sleep apnea for which he is he uses CPAP 100% of the time and uses  [employer] for his [employer].  Since I last saw him in May 2017.  He denied any recurrent episodes of chest pain or significant dyspnea.  He had a new CPAP machine and continue to be compliant.  He established primary care with Dr. Danise Mina.  Review of laboratory that he had with him in December 2016 has shown a cholesterol of 149, HDL 27, but his triglycerides were 413.  Remotely he had triglyceride elevations as high as 584.  In December, his hemoglobin A1c was 7.7.  He underwent surgery in July 2017 due to an abscess in his colon.  He required a colostomy for 6 months and in January 2018 underwent colostomy closure.   I saw him in October 2018 after not having seen him in over 17 months.  Since that evaluation, he has undergone 2 resections for cyst on the back of his neck with recurrent infections.  He recently saw Dr. Brantley Stage consideration of surgical excision.  He has felt well with reference to chest pain or shortness of breath.  He had smoked for many years but quit smoking for 10 years but  unfortunately resumed smoking in June 2018 and currently smoking 1 pack/day.  He has not had a nuclear perfusion study since 2012.  He continues to use CPAP with 100% compliance.  He was  started on Januvia for his diabetes mellitus by his primary physician.    When I saw hime in June 2019  he was in need of back and neck surgery.  I recommended he undergo a [place] study for preoperative assessment.  This was done on April 23, 2018 which showed a small inferior defect, low risk.  EF was 50% with basal inferior hypokinesis.  He tolerated his neck and back surgery well.  Is only he is without anginal symptoms.  He has a documented umbilical/ventral  hernia and has been without abdominal discomfort.  He continues to use CPAP with 100% compliance.  He received a new CPAP machine at the [facility] is a [employer] AirSense 10.  He gets his supplies from the [place].    I saw him in February 2020 and  saw him in January 2021.  He denied any chest pain  with walking but does admit to some mild shortness of breath.  Unfortunately he still smokes 1 pack of cigarettes per day.  He had experienced some throat burning which had improved with pantoprazole.  He has an umbilical hernia and ventral hernia but denies any pain.  He continues to be on aspirin and Plavix for DAPT.  He is on atorvastatin '40mg'$ ,  Zetia 10 mg and Lovaza 2 capsules twice daily for mixed hyperlipidemia.  He continues to be on lisinopril 5 mg and atenolol 50 mg in the morning and 25 mg in the evening for hypertension.  There are no palpitations.  He is diabetic on glimepiride and Metformin.  He was bradycardic but was entirely asymptomatic.  I saw him in July 2021 at that time he stated that he had experienced  several episodes of chest heaviness.  His episodes were short-lived times he would take Tums with benefit.  Unfortunately he still smokes cigarettes.  He has been drinking an excessive amount of coffee typically at least 6 cups/day.  There was  some dyspepsia and he recently started taking Pepcid with some improvement.  He apparently is need to undergo endoscopy to be done at the [place]. during that evaluation, I again discussed the importance of smoking cessation.  With his longstanding history of CAD, and recent chest discomfort I recommended he undergo 2-year follow-up Lexiscan Myoview for further evaluation.    He underwent a [place] Myoview study on May 11, 2020.  There was evidence for small size mild fixed mid inferior perfusion defect most likely scar.  There was no associated ischemia.  EF was 43% with basal to mid inferior hypokinesis.  There was no change from the prior study in 2019.  I saw him on June 21, 2020 at which time he felt improved.  He denied any recurrent chest pain or exertional symptoms.  He was trying to significantly reduce his tobacco which previously had been 1 to 2 packs/day and most recently was 1 pack/day.  During that evaluation, his ECG showed marked sinus bradycardia at 43 bpm.  I recommended he reduce his a atenolol to 25 mg twice a day.  I saw him on December 20, 2020 and since his prior evaluation he had remained cardiovascularly stable.    He has issues with GERD and is now on Pepcid 40 mg twice daily followed by Dr. Ardis Hughs of GI.  He continues to use CPAP which is now followed at the [place].  Unfortunately he is still smoking 1 pack/day.  He will be undergoing repeat laboratory in 2 weeks with Dr. Danise Mina.  He denied any recurrent chest pain PND orthopnea, palpitations, presyncope or syncope.  I saw him on 11-16-21.  Unfortunately his son died at 28 in 24-Feb-2021.  He had significant health related issues prior to his death.  Mr. Ziomek denies any chest pain.  He is still smoking approximately 1 pack/day.  He continues to use CPAP 100% compliance.  He continues to have issues with GERD and is on therapy.  His last stress test was done in July 2021 which showed a nuclear EF at 43%.  There was evidence  for fixed mid inferior perfusion defect most likely scar.  At times he does note a vague chest sensation which he feels may be his GERD, but at times he did experience some left arm radiation.  He denies presyncope or syncope.  He is unaware of palpitations.  At times he has experienced some mild ankle.  His ECG showed sinus bradycardia at 43 bpm and I recommended he reduce atenolol from 25 mg twice a day to 25 mg in the morning and 12.5 mg at night.  I also empirically added isosorbide 30 mg to his medical regimen to provide potential anti-ischemic  benefit.  On August 06, 2022 he suffered a witnessed cardiac arrest at home.  His wife was there and a bystander CPR was initiated.  EMS arrived and he received 6 shocks for VF before ROSC.  He was intubated.  He underwent cardiac catheterization by Dr. Tamala Julian and was found to have 99% mid RCA stenosis just above the previously placed bare-metal stent.  There were left-to-right collaterals.  The RCA was heavily calcified with the proximal segment having 50% stenosis.  He underwent PCI with overlapping synergy DES 3.5 x 28 mm stent inserted postdilated to 3.75 mm.  The 99% lesion was reduced to 0 but there was persistent underexpansion of approximately 50% in the more proximal segment.  Acute EF was estimated at 30 to 35% with severe hypokinesis to akinesis in the inferior wall basal to apical.  On August 07, 2022 echo Doppler study showed EF now at 45 to 50% with inferior hypokinesis.  There was mild biatrial dilation, mild mitral annular calcification as well as mild aortic calcification with aortic sclerosis without stenosis.  He was ultimately discharged on August 10, 2022 on aspirin/Plavix, Jardiance 10 mg, Entresto 24/26 mL twice a day, furosemide 20 mg to take as needed in addition to atorvastatin 80 mg, Zetia 10 mg and Lovaza 2 capsules twice a day, metformin and glimepiride for diabetes, and Neurontin.  He was evaluated on August 23, 2022 in the transition  of care clinic heart failure pharmacy encounter and samples were provided.  I last saw him on September 07, 2022 at which time he felt well and denied any recurrent chest pain or shortness of breath.  He denied any PND, orthopnea, presyncope or syncope and was unaware of palpitations.  Was continuing to use CPAP prescribed at the [place] where he gets his supplies.  However he has not had any follow-up assessment of his AHI result.    Since I saw him continues to feel well and has been without recurrent chest pain or shortness of breath.  He is unaware of palpitations.  He continues to be on aspirin/Plavix for DAPT.  He is on atorvastatin 80 mg, Zetia 10 mg Lovaza 2 g twice a day for mixed hyperlipidemia.  With reference to his used LV dysfunction he is on Jardiance 10 mg, Entresto 49/51 mg twice a day, furosemide 20 mg daily and metoprolol succinate 12.5 mg.  He is diabetic on metformin.  He has continued to use CPAP.  Apparently we were able to obtain data from his CPAP machine.  He tells me he has never seen a doctor regarding his pressures.  He is compliant with excellent use averaging 9 hours and 4 minutes per night.  His pressure is set at a range of 5 to 12 cm.  AHI is elevated at 12.4 with an apnea index of 10.9 and central apnea index of 2.7.  He does have a mask leak.  His 95th percentile pressure is 11.1 with maximum average pressure 11.8.  He presents for evaluation.  Past Medical History:  Diagnosis Date   Agent orange exposure 1968   Arthritis    CAD (coronary artery disease)    4 stents in right artery   Complex tear of medial meniscus of left knee as current injury 07/26/2012   [place]   Diabetes mellitus    type 2   Diverticulitis of colon with perforation 05/23/2016   DVT (deep venous thrombosis) (HCC)    GERD (gastroesophageal reflux disease)    Glaucoma 2006  [facility] drops[facility]   Hx of colonic polyps    Hyperlipidemia    Hypertension    Intra-abdominal abscess ([place]) 05/16/2016    LUMBAR RADICULOPATHY 07/18/2007   Qualifier: Diagnosis of  By: Arnoldo Morale MD, Balinda Quails    Myocardial infarction [facility])    1992   Sleep apnea    uses CPAP regularly    Past Surgical History:  Procedure Laterality Date   [place] N/A 05/23/2016   Procedure: SMALL BOWEL RESECTION;  Surgeon: Jackolyn Confer, MD;  Location: [facility];  Service: General;  Laterality: N/A;   CARDIAC CATHETERIZATION  08/11/2008   angiographically patent LAD diagonal, circumflex and multiple circumflex obtuse marginal branches from the L coronary artery; stenting of mid R coronary artery with 3.5 x 33m Liberte non DES postdilated w/ 4.0 DuraStent ([name]   CARDIOVASCULAR STRESS TEST  06/16/2011   R/P MV - moderate perfusion defect in basal inferoseptal, basal inferior, mid inferseptal and mid inferior regions, consistent with infarct/scar and/or diaphragmatic attenuation; non-gated study secondary to ectopy; no CP or EKG changes for ischemia; abnormal study although no significant changes from previous study; in the absense of gated images cannot calculate EF or distinguish scar/artifact   COLONOSCOPY  08/2016   TAx5, sessile serrated polyp, diverticulosis, rpt 2 yrs ([place])   COLONOSCOPY  10/2020   1.5cm SSP, HP, rpt (Ardis Hughs   COLONOSCOPY  08/2020   multiple polpys removed (TA, HP), 1.5cm polyp biopsied (SSP), colon lipoma (Ardis Hughs   COLONOSCOPY WITH PROPOFOL N/A 11/11/2020   Procedure: COLONOSCOPY WITH PROPOFOL;  Surgeon: JMilus Banister MD;  [facility];  Service: Endoscopy;  Laterality: N/A;   COLOSTOMY Left 05/23/2016   Procedure: COLOSTOMY;  Surgeon: TJackolyn Confer MD;  [facility];  Service: General;  Laterality: Left;   COLOSTOMY TAKEDOWN N/A 11/10/2016   Procedure: LAPAROSCOPIC LYSIS OF ADHESIONS AND  COLOSTOMY CLOSURE;  Surgeon: TJackolyn Confer MD;  [facility];  Service: General;  Laterality: N/A;   CORONARY/GRAFT ACUTE MI REVASCULARIZATION N/A 08/06/2022    Procedure: Coronary/Graft Acute MI Revascularization;  Surgeon: SBelva Crome MD;  [facility];  Service: Cardiovascular;  Laterality: N/A;   CYST EXCISION  04/2018   posterior neck ([place])   DOPPLER ECHOCARDIOGRAPHY  08/20/2008   EF >>18% LV systolic function normal; LV mildly dilated; doppler flow suggestive of impaired LV relaxation; RV mildly dilated, RV systolic function normal, RV systolic pressure normal   ESOPHAGOGASTRODUODENOSCOPY  08/2020   gastritis, duodenitis likely NSAID/ASA related, H pylori neg - rec stop aspirin (Ardis Hughs   INCISION AND DRAINAGE ABSCESS N/A 05/23/2016   Procedure: DRAINAGE OF MULTIPLE INTRA  ABDOMINAL ABSCESS;  Surgeon: TJackolyn Confer MD;  [facility];  Service: General;  Laterality: N/A;   KNEE SURGERY Left 2015   torn meniscus ([place]   LAPAROSCOPIC LYSIS OF ADHESIONS  11/10/2016   Procedure: LAPAROSCOPIC LYSIS OF ADHESIONS;  Surgeon: TJackolyn Confer MD;  [facility];  Service: General;;   LAPAROTOMY N/A 05/23/2016   Procedure: EXPLORATORY LAPAROTOMY;  Surgeon: TJackolyn Confer MD;  [facility];  Service: General;  Laterality: N/A;   LEFT HEART CATH AND CORONARY ANGIOGRAPHY N/A 08/06/2022   Procedure: LEFT HEART CATH AND CORONARY ANGIOGRAPHY;  Surgeon: SBelva Crome MD;  [facility];  Service: Cardiovascular;  Laterality: N/A;   MASS EXCISION N/A 05/22/2018   Procedure: EXCISION OF CYST ON POSTERIOR NECK ERAS PATHWAY;  Surgeon: CErroll Luna MD;  Location: [place];  Service: General;  Laterality: N/A;   PERCUTANEOUS CORONARY STENT INTERVENTION (PCI-S)  1992, 1999, 2004   4 vessels [name])   POLYPECTOMY  11/11/2020   Procedure: POLYPECTOMY;  Surgeon: Milus Banister, MD;  [facility];  Service: Endoscopy;;   PROCTOSCOPY  11/10/2016   Procedure: PROCTOSCOPY;  Surgeon: Jackolyn Confer, MD;  [facility];  Service: General;;   SUBMUCOSAL LIFTING INJECTION  11/11/2020    Procedure: SUBMUCOSAL LIFTING INJECTION;  Surgeon: Milus Banister, MD;  Location[facility];  Service: Endoscopy;;    Allergies  Allergen Reactions   Dilaudid [Hydromorphone Hcl] Itching   Ambien [Zolpidem] Other (See Comments)    Pt states "makes me wild"   Niacin And Related Other (See Comments)    Extreme fatigue    Current Outpatient Medications  Medication Sig Dispense Refill   aspirin EC 81 MG tablet Take 1 tablet (81 mg total) by mouth daily. Swallow whole. 90 tablet 3   Brinzolamide-Brimonidine 1-0.2 % SUSP Apply 1 drop to eye 3 (three) times daily.      cetirizine (ZYRTEC) 10 MG tablet Take 1 tablet (10 mg total) by mouth daily. 90 tablet 3   clopidogrel (PLAVIX) 75 MG tablet Take 1 tablet by mouth once daily 90 tablet 1   Coenzyme Q10 (COQ10) 200 MG CAPS Take 200 mg by mouth daily.     empagliflozin (JARDIANCE) 10 MG TABS tablet Take 1 tablet (10 mg total) by mouth daily before breakfast. 90 tablet 3   ezetimibe (ZETIA) 10 MG tablet Take 1 tablet (10 mg total) by mouth daily. 90 tablet 3   fluticasone (FLONASE) 50 MCG/ACT nasal spray Place 2 sprays into both nostrils daily. 16 g 1   gabapentin (NEURONTIN) 100 MG capsule Take 100 mg by mouth 3 (three) times daily.     glimepiride (AMARYL) 2 MG tablet Take 1 tablet (2 mg total) by mouth daily before breakfast. 90 tablet 3   latanoprost (XALATAN) 0.005 % ophthalmic solution Place 1 drop into both eyes at bedtime.     metFORMIN (GLUCOPHAGE) 1000 MG tablet TAKE 1 TABLET BY MOUTH TWICE DAILY WITH MEALS 180 tablet 2   metoprolol succinate (TOPROL XL) 25 MG 24 hr tablet Take 0.5 tablets (12.5 mg total) by mouth daily. 45 tablet 3   nitroGLYCERIN (NITROSTAT) 0.4 MG SL tablet For chest pain, tightness, or pressure. While sitting, place 1 tablet under tongue. May be used every 5 minutes as needed, for up to 15 minutes. Do not use more than 3 tablets. 25 tablet 3   omega-3 acid ethyl esters (LOVAZA) 1 g capsule Take 2 capsules by  mouth twice daily 360 capsule 2   sacubitril-valsartan (ENTRESTO) 49-51 MG Take 1 tablet by mouth 2 (two) times daily. 180 tablet 3   senna-docusate (SENOKOT-S) 8.6-50 MG tablet Take 1 tablet by mouth at bedtime as needed for mild constipation. 15 tablet 0   TURMERIC CURCUMIN PO Take 1 capsule by mouth daily.     atorvastatin (LIPITOR) 80 MG tablet Take 1 tablet (80 mg total) by mouth daily. 90 tablet 3   furosemide (LASIX) 20 MG tablet Take 1 tablet (20 mg total) by mouth daily. 90 tablet 3   No current facility-administered medications for this visit.    Socially he is married has 2 children 2 grandchildren. He does walk several times a week. He does have a tobacco history. There is no alcohol use.   ROS General: Negative; No fevers,  chills, or night sweats; positive for obesity. HEENT: Negative; No changes in vision or hearing, sinus congestion, difficulty swallowing Pulmonary: Negative; No cough, wheezing, shortness of breath, hemoptysis Cardiovascular: See HPI GI: Colonic abscess requiring colostomy, and ultimate closure; positive for dyspepsia GU: Negative; No dysuria, hematuria, or difficulty voiding Musculoskeletal: He is status post left knee surgery; no myalgias, joint pain, or weakness Hematologic/Oncology: Negative; no easy bruising, bleeding Endocrine: Negative; no heat/cold intolerance; no diabetes Neuro: Negative; no changes in balance, headaches Skin: Recurrent cysts in the back of his neck Psychiatric: Negative; No behavioral problems, depression Sleep: Positive for sleep apnea, on CPAP with splint compliance. No snoring, daytime sleepiness, hypersomnolence, bruxism, restless legs, hypnogognic hallucinations, no cataplexy Other comprehensive 14 point system review is negative.   PE BP (!) 120/56   Pulse 63   Ht '5\' 8"'$  (1.727 m)   Wt 235 lb 12.8 oz (107 kg)   SpO2 93%   BMI 35.85 kg/m    Repeat blood pressure by me was 122/68  Wt Readings from Last 3  Encounters:  11/28/22 235 lb 12.8 oz (107 kg)  10/06/22 228 lb 6.4 oz (103.6 kg)  09/07/22 219 lb (99.3 kg)   General: Alert, oriented, no distress.  Skin: normal turgor, no rashes, warm and dry HEENT: Normocephalic, atraumatic. Pupils equal round and reactive to light; sclera anicteric; extraocular muscles intact; Fundi ** Nose without nasal septal hypertrophy Mouth/Parynx benign; Mallinpatti scale 3 Neck: No JVD, no carotid bruits; normal carotid upstroke Lungs: clear to ausculatation and percussion; no wheezing or rales Chest wall: without tenderness to palpitation Heart: PMI not displaced, RRR, s1 s2 normal, 1/6 systolic murmur, no diastolic murmur, no rubs, gallops, thrills, or heaves Abdomen: Previously noted abdominal hernia, soft, nontender; no hepatosplenomehaly, BS+; abdominal aorta nontender and not dilated by palpation. Back: no CVA tenderness Pulses 2+ Musculoskeletal: full range of motion, normal strength, no joint deformities Extremities: no clubbing cyanosis or edema, Homan's sign negative  Neurologic: grossly nonfocal; Cranial nerves grossly wnl Psychologic: Normal mood and affect   September 07, 2022 ECG (independently read by me): NSR at 73, No STT changes  October 25, 2021 ECG (independently read by me):  Sinus bradycardia at 43; mild T wave abnormality  December 20, 2020 ECG (independently read by me): Sinus bradycardia at 49  August 2021 ECG (independently read by me): Sinus Bradycardia at 43; no ectopy; normal intervals  July 2021ECG (independently read by me): Sinus bradycardia at 43 bpm.  No significant ST-T changes.  Normal intervals.  No ectopy  November 10, 2019 ECG (independently read by me):Sinus bradycardia at 46 bpm.  Nonspecific T changes.  Normal intervals.  No ectopy  February 2020 ECG (independently read by me): Sinus Bradycardioa 51; Norml intervals  June 2018 ECG (independently read by me): Sinus bradycardia 57 bpm.  No ectopy.  Normal  intervals.  October 2018 ECG (independently read by me): Normal sinus rhythm with PAC.  PR interval 168 ms.  QTc interval 454 ms.  May 2017 ECG (independently read by me): Sinus bradycardia 52 bpm.  T-wave abnormality in leads 3 and aVF.  May 2016 ECG (independently read by me): Sinus bradycardia 53 bpm.  Nondiagnostic T changes with T-wave inversion in lead 3.  June 2015 ECG (independently read by me):  sinus bradycardia at 55 QTc interval 447 ms.  PR interval 174 ms.  Nondiagnostic T changes in lead 3.  ECG: Sinus rhythm at 56 beats per minute. No ectopy. Normal intervals; nonspecific T changes in  lead 3.  LABS:    Latest Ref Rng & Units 08/29/2022   11:43 AM 08/23/2022   10:36 AM 08/16/2022   10:55 AM  BMP  Glucose 70 - 99 mg/dL 119  145  131   BUN 8 - 23 mg/dL '30  13  16   '$ Creatinine 0.61 - 1.24 mg/dL 1.26  1.07  1.09   BUN/Creat Ratio 10 - 24   15   Sodium 135 - 145 mmol/L 141  142  142   Potassium 3.5 - 5.1 mmol/L 4.3  3.9  3.9   Chloride 98 - 111 mmol/L 100  105  101   CO2 22 - 32 mmol/L '26  28  25   '$ Calcium 8.9 - 10.3 mg/dL 9.9  9.4  9.4       Latest Ref Rng & Units 08/07/2022    5:23 PM 08/07/2022    5:09 AM 08/06/2022    5:30 PM  Hepatic Function  Total Protein 6.5 - 8.1 g/dL 5.5  5.1  5.7   Albumin 3.5 - 5.0 g/dL 3.1  2.9  3.4   AST 15 - 41 U/L 204  248  237   ALT 0 - 44 U/L 134  126  85   Alk Phosphatase 38 - 126 U/L 42  43  51   Total Bilirubin 0.3 - 1.2 mg/dL 0.8  0.9  0.7        Latest Ref Rng & Units 08/23/2022   10:36 AM 08/09/2022    5:07 AM 08/08/2022    4:23 AM  CBC  WBC 4.0 - 10.5 K/uL 5.9  7.1  8.9   Hemoglobin 13.0 - 17.0 g/dL 12.8  11.3  11.4   Hematocrit 39.0 - 52.0 % 39.1  34.4  34.1   Platelets 150 - 400 K/uL 228  82  81    Lab Results  Component Value Date   TSH 0.90 03/12/2020   Lab Results  Component Value Date   HGBA1C 7.4 (A) 10/06/2022   Lipid Panel     Component Value Date/Time   CHOL 91 05/30/2022 0726   CHOL 149  03/13/2016 0803   TRIG 185.0 (H) 05/30/2022 0726   TRIG 387 (H) 03/13/2016 0803   HDL 33.90 (L) 05/30/2022 0726   HDL 26 (L) 03/13/2016 0803   CHOLHDL 3 05/30/2022 0726   VLDL 37.0 05/30/2022 0726   LDLCALC 20 05/30/2022 0726   LDLCALC 46 03/13/2016 0803   LDLDIRECT 61.0 12/27/2018 1245    RADIOLOGY: No results found.  [place] Myoview 05/11/2020 Study Highlights    The left ventricular ejection fraction is moderately decreased (30-44%). Nuclear stress EF: 43%. No T wave inversion was noted during stress. There was no ST segment deviation noted during stress. Defect 1: There is a small defect of mild severity present in the mid inferior location. Findings consistent with prior myocardial infarction. This is an intermediate risk study.   Small size, mild severity fixed mid inferior perfusion defect, likely scar. LVEF 43% with basal to mid inferior hypokinesis. This is an intermediate risk study. Compared to a prior study in 2019, the LVEF is calculated lower, but appears visually higher - the perfusion defect appears unchanged.    CATH/PCI: 08/06/2022 CONCLUSIONS: Cardiac arrest in the setting of ACS identified that the mid RCA above a previously placed stent is 99% stenosis with TIMI grade II flow and recruited left-to-right collaterals distally.  The vessel is heavily calcified and the entire proximal segment is segmentally  50% narrowed. The culprit lesion was dilated and then stented with a 28 x 3.5 mm Synergy postdilated using high-pressure with a 3.75 x 22 [place] balloon to 17 atm x 2.  TIMI grade III flow established.  0% stenosis was noted at the site of the 99% stenosis but persistent 50% underexpansion was noted proximal to the culprit. Left main is widely patent LAD is widely patent Circumflex is widely patent Inferior wall to the apex is severely hypokinetic to akinetic.  EDP is 17.  EF is 30 to 35%.   RECOMMENDATIONS:   Unknown if patient took Plavix this morning.   Cangrelor started in Cath Lab.  Once NG tube is in place cangrelor will be discontinued and 300 mg of clopidogrel given per NG tube. Management of ventilator and postcardiac arrest state by critical care medicine.  Intention is for patient to be on aspirin and Plavix. Sheath will be pulled according to protocol when ACT/Angiomax are at target.      Intervention       IMPRESSION:  1. Ischemic cardiomyopathy   2. Coronary artery disease involving native coronary artery of native heart with angina pectoris ([place])   3. OSA on CPAP   4. Hyperlipidemia with target LDL less than 70   5. Type 2 diabetes mellitus with complication, without long-term current use of insulin ([place])      ASSESSMENT AND PLAN: Mr. Rimel  is a 77 year old Caucasian male who has CAD and underwent initial PTCA to his RCA in 1992.  At  catheterization in October 2009 a fourth stent was placed in his RCA.   He continues to be on dual antiplatelet therapy with aspirin and Plavix with his multiple stents.  A preoperative Lexiscan Myoview study in July 2019 prior to his back surgery was low risk and showed a small inferior defect with EF 50%.  When I saw him in July 2021 he had developed nonexertional episodes of chest pressure which were short-lived and often improved with antacid therapy. Symptoms would typically last a minute or 2.  He was started on Pepcid with improvement.  At the time he was drinking over 6 cups of coffee per day which undoubtedly contributed to his dyspeptic symptomatology.  His [place] study was unchanged from 2019 and showed mild severity fixed mid inferior perfusion defect most likely scar.  Nuclear EF was 43% but visually it appeared higher.  When I saw him in January 2023, he was experiencing some reflux symptomatology but at times  experienced some vague left arm sensation.  Due to sinus bradycardia with heart rate at 43 his atenolol dose was reduced from 25 twice a day to 25 mg in the morning  and 12.5 mg at night.  Isosorbide 30 mg was added to his medical regimen and he continued to be on DAPT with aspirin/Plavix, and for hyperlipidemia atorvastatin 40 mg, Lovaza 2 capsules twice a day, and Zetia.  He was diabetic on glimepiride, metformin in addition to pioglitazone.  He was using CPAP with 100% compliance.  He suffered a witnessed cardiac arrest at home on August 06, 2022 and CPR was started by his wife.  When EMS arrived, he required 6 shocks for VF before ROSC.  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   Although acute EF was 30 to 35%, 1 day later EF had improved to 45 to 50%.  Only, he is on good guideline directed medical  therapy with Jardiance 10 mg, Entresto 49/51 mg twice a day, furosemide 20 mg daily in addition to continue DAPT with aspirin/Plavix.  When I saw him in November 2023 I added metoprolol succinate 12.5 mg with his resting pulse at 73.  He continues to be on atorvastatin 80 mg, Zetia 10 mg, and Lovaza 2 capsules twice a day for mixed hyperlipidemia.  I reviewed the download which we were able to obtain today from [employer] his [employer]Med air sense 10 AutoSet unit.  Usage is excellent with average use at 9 hours and 4 minutes.  AHI, however is elevated at 12.4 with his pressure set at a range of 5 to 12 cm of water.  AI was 10.9 and central apnea index was 2.7.  I have suggested slight titration of his CPAP pressures and will change his settings  to 7 to 14 cm of water.  BMI is increased at 35.85 consistent with moderate obesity.  Weight loss was recommended.  Forward sleepiness scale score was calculated in the office today and this endorsed that 1 arguing against residual daytime sleepiness.  I have recommended that in May 2024 we obtain a follow-up echo Doppler study to see if his LV function is further improved with guideline directed medical therapy.  I will see him in June for  follow-up evaluation or sooner as needed.    Troy Sine, MD, [facility]  11/30/2022 5:33 PM

## 2022-11-30 ENCOUNTER — Encounter: Payer: Self-pay | Admitting: Cardiovascular Disease

## 2023-01-17 ENCOUNTER — Telehealth: Payer: Self-pay | Admitting: Family Medicine

## 2023-01-17 NOTE — Telephone Encounter (Signed)
Error

## 2023-01-17 NOTE — Telephone Encounter (Signed)
Contacted [name] to schedule their annual wellness visit. Appointment made for 02/07/2023.  Thank you,  Colletta Maryland,  [facility] Direct Dial ??[phone]

## 2023-02-05 ENCOUNTER — Encounter: Payer: Self-pay | Admitting: Family Medicine

## 2023-02-05 ENCOUNTER — Ambulatory Visit (INDEPENDENT_AMBULATORY_CARE_PROVIDER_SITE_OTHER): Payer: HMO | Admitting: Family Medicine

## 2023-02-05 VITALS — BP 134/68 | HR 63 | Temp 97.1°F | Ht 68.0 in | Wt 244.5 lb

## 2023-02-05 DIAGNOSIS — E1169 Type 2 diabetes mellitus with other specified complication: Secondary | ICD-10-CM | POA: Diagnosis not present

## 2023-02-05 DIAGNOSIS — E1121 Type 2 diabetes mellitus with diabetic nephropathy: Secondary | ICD-10-CM

## 2023-02-05 LAB — RENAL FUNCTION PANEL
Albumin: 4.4 g/dL (ref 3.5–5.2)
BUN: 23 mg/dL (ref 6–23)
CO2: 24 mEq/L (ref 19–32)
Calcium: 9.6 mg/dL (ref 8.4–10.5)
Chloride: 102 mEq/L (ref 96–112)
Creatinine, Ser: 1.38 mg/dL (ref 0.40–1.50)
GFR: 49.33 mL/min — ABNORMAL LOW (ref >60.00)
Glucose, Bld: 238 mg/dL — ABNORMAL HIGH (ref 70–99)
Phosphorus: 3.6 mg/dL (ref 2.3–4.6)
Potassium: 4.1 mEq/L (ref 3.5–5.1)
Sodium: 136 mEq/L (ref 135–145)

## 2023-02-05 LAB — URINALYSIS, ROUTINE W REFLEX MICROSCOPIC
Bilirubin Urine: NEGATIVE
Hgb urine dipstick: NEGATIVE
Ketones, ur: NEGATIVE
Leukocytes,Ua: NEGATIVE
Nitrite: NEGATIVE
RBC / HPF: NONE SEEN (ref 0–?)
Specific Gravity, Urine: 1.01 (ref 1.000–1.030)
Total Protein, Urine: NEGATIVE
Urine Glucose: >=1000 — AB
Urobilinogen, UA: 0.2 (ref 0.0–1.0)
WBC, UA: NONE SEEN (ref 0–?)
pH: 5.5 (ref 5.0–8.0)

## 2023-02-05 LAB — POCT GLYCOSYLATED HEMOGLOBIN (HGB A1C): Hemoglobin A1C: 10.1 % — AB (ref 4.0–5.6)

## 2023-02-05 LAB — CBC WITH DIFFERENTIAL/PLATELET
Basophils Absolute: 0.1 10*3/uL (ref 0.0–0.1)
Basophils Relative: 1.5 % (ref 0.0–3.0)
Eosinophils Absolute: 0.3 10*3/uL (ref 0.0–0.7)
Eosinophils Relative: 6.2 % — ABNORMAL HIGH (ref 0.0–5.0)
HCT: 39.9 % (ref 39.0–52.0)
Hemoglobin: 13.8 g/dL (ref 13.0–17.0)
Lymphocytes Relative: 23 % (ref 12.0–46.0)
Lymphs Abs: 1.3 10*3/uL (ref 0.7–4.0)
MCHC: 34.5 g/dL (ref 30.0–36.0)
MCV: 93 fl (ref 78.0–100.0)
Monocytes Absolute: 0.4 10*3/uL (ref 0.1–1.0)
Monocytes Relative: 6.7 % (ref 3.0–12.0)
Neutro Abs: 3.4 10*3/uL (ref 1.4–7.7)
Neutrophils Relative %: 62.6 % (ref 43.0–77.0)
Platelets: 147 10*3/uL — ABNORMAL LOW (ref 150.0–400.0)
RBC: 4.29 Mil/uL (ref 4.22–5.81)
RDW: 14.5 % (ref 11.5–15.5)
WBC: 5.5 10*3/uL (ref 4.0–10.5)

## 2023-02-05 MED ORDER — OZEMPIC (0.25 OR 0.5 MG/DOSE) 2 MG/3ML ~~LOC~~ SOPN: PEN_INJECTOR | SUBCUTANEOUS | 3 refills | Status: AC

## 2023-02-05 NOTE — Progress Notes (Signed)
Ph: [phone]       Fax: [phone]   Patient ID: Evan Avery, male    DOB: November 09, 1945, 77 y.o.   MRN: [id number]  This visit was conducted in person.  BP 134/68   Pulse 63   Temp (!) 97.1 F (36.2 C) (Temporal)   Ht 5\' 8"  (1.727 m)   Wt 244 lb 8 oz (110.9 kg)   SpO2 94%   BMI 37.18 kg/m    CC: 4 mo DM f/u visit  Subjective:   HPI: Evan Avery is a 77 y.o. male presenting on 02/05/2023 for Medical Management of Chronic Issues (Here for 4 mo DM f/u.)   Regularly sees cardiology s/p out of hospital arrest 07/2022 with subsequent ICU stay.  DM - does not regularly check sugars. Last check with 178 fasting 1 wk ago. Compliant with antihyperglycemic regimen which includes: jardiance 10mg  daily, metformin 1000mg  bid, glimepiride 2mg  daily. Denies low sugars or hypoglycemic symptoms. Denies paresthesias, blurry vision. Last diabetic eye exam 12/2022 - records requested today @ [place]. H/o glaucoma laser surgery to right eye last month. He regularly sees the [place] for diabetic eye exam. Glucometer brand: unsure but he has meters he received from the [place]. Last foot exam: 09/2022. DSME: previously declined. He notes increased cakes, pies, pastries over the past 3 months. No fmhx thyroid cancer, no personal history of pancreatitis.  Lab Results  Component Value Date   HGBA1C 10.1 (A) 02/05/2023   Diabetic Foot Exam - Simple   No data filed    Lab Results  Component Value Date   MICROALBUR 7.8 (H) 05/30/2022    He receives [place] and [place] through the [place].      Relevant past medical, surgical, family and social history reviewed and updated as indicated. Interim medical history since our last visit reviewed. Allergies and medications reviewed and updated. Outpatient Medications Prior to Visit  Medication Sig Dispense Refill   aspirin EC 81 MG tablet Take 1 tablet (81 mg total) by mouth daily. Swallow whole. 90 tablet 3   atorvastatin (LIPITOR) 80 MG tablet Take  1 tablet (80 mg total) by mouth daily. 90 tablet 3   Brinzolamide-Brimonidine 1-0.2 % SUSP Apply 1 drop to eye 3 (three) times daily.      cetirizine (ZYRTEC) 10 MG tablet Take 1 tablet (10 mg total) by mouth daily. 90 tablet 3   clopidogrel (PLAVIX) 75 MG tablet Take 1 tablet by mouth once daily 90 tablet 1   Coenzyme Q10 (COQ10) 200 MG CAPS Take 200 mg by mouth daily.     empagliflozin (JARDIANCE) 10 MG TABS tablet Take 1 tablet (10 mg total) by mouth daily before breakfast. 90 tablet 3   ezetimibe (ZETIA) 10 MG tablet Take 1 tablet (10 mg total) by mouth daily. 90 tablet 3   fluticasone (FLONASE) 50 MCG/ACT nasal spray Place 2 sprays into both nostrils daily. 16 g 1   furosemide (LASIX) 20 MG tablet Take 1 tablet (20 mg total) by mouth daily. 90 tablet 3   gabapentin (NEURONTIN) 100 MG capsule Take 100 mg by mouth 3 (three) times daily.     glimepiride (AMARYL) 2 MG tablet Take 1 tablet (2 mg total) by mouth daily before breakfast. 90 tablet 3   latanoprost (XALATAN) 0.005 % ophthalmic solution Place 1 drop into both eyes at bedtime.     metFORMIN (GLUCOPHAGE) 1000 MG tablet TAKE 1 TABLET BY MOUTH TWICE DAILY WITH MEALS 180 tablet 2  metoprolol succinate (TOPROL XL) 25 MG 24 hr tablet Take 0.5 tablets (12.5 mg total) by mouth daily. 45 tablet 3   nitroGLYCERIN (NITROSTAT) 0.4 MG SL tablet For chest pain, tightness, or pressure. While sitting, place 1 tablet under tongue. May be used every 5 minutes as needed, for up to 15 minutes. Do not use more than 3 tablets. 25 tablet 3   omega-3 acid ethyl esters (LOVAZA) 1 g capsule Take 2 capsules by mouth twice daily 360 capsule 2   sacubitril-valsartan (ENTRESTO) 49-51 MG Take 1 tablet by mouth 2 (two) times daily. 180 tablet 3   senna-docusate (SENOKOT-S) 8.6-50 MG tablet Take 1 tablet by mouth at bedtime as needed for mild constipation. 15 tablet 0   TURMERIC CURCUMIN PO Take 1 capsule by mouth daily.     No facility-administered medications prior  to visit.     Per HPI unless specifically indicated in ROS section below Review of Systems  Objective:  BP 134/68   Pulse 63   Temp (!) 97.1 F (36.2 C) (Temporal)   Ht  (1.727 m)   Wt 244 lb 8 oz (110.9 kg)   SpO2 94%   BMI 37.18 kg/m   Wt Readings from Last 3 Encounters:  02/05/23 244 lb 8 oz (110.9 kg)  11/28/22 235 lb 12.8 oz (107 kg)  10/06/22 228 lb 6.4 oz (103.6 kg)      Physical Exam Vitals and nursing note reviewed.  Constitutional:      Appearance: Normal appearance. He is not ill-appearing.  HENT:     Mouth/Throat:     Mouth: Mucous membranes are dry.     Pharynx: Oropharynx is clear. No oropharyngeal exudate.     Comments: Dry MM Eyes:     Extraocular Movements: Extraocular movements intact.     Conjunctiva/sclera: Conjunctivae normal.     Pupils: Pupils are equal, round, and reactive to light.  Cardiovascular:     Rate and Rhythm: Normal rate and regular rhythm.     Pulses: Normal pulses.     Heart sounds: Normal heart sounds. No murmur heard. Pulmonary:     Effort: Pulmonary effort is normal. No respiratory distress.     Breath sounds: Normal breath sounds. No wheezing, rhonchi or rales.  Musculoskeletal:     Right lower leg: No edema.     Left lower leg: No edema.     Comments: See HPI for foot exam if done  Skin:    General: Skin is warm and dry.     Findings: No rash.  Neurological:     Mental Status: He is alert.  Psychiatric:        Mood and Affect: Mood normal.        Behavior: Behavior normal.       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  Continue current regimen, add ozempic 0.25mg  weekly x 2 wks then increase to 0.5mg  weekly.  Reviewed mechanism of action of medication as well as side effects and adverse events to watch for including nausea, diarrhea, constipation, pancreatitis. No fmhx medullary thyroid cancer. Discussed titration schedule for medication.       Relevant Medications   Semaglutide,0.25 or 0.5MG /DOS, (OZEMPIC, 0.25 OR 0.5 MG/DOSE,) 2 MG/3ML SOPN   Other Relevant Orders   POCT glycosylated hemoglobin (Hb A1C) (Completed)   Urinalysis, Routine w reflex microscopic   Type 2 diabetes mellitus with diabetic nephropathy, without long-term current use of insulin    Update labs today.       Relevant Medications   Semaglutide,0.25 or 0.5MG /DOS, (OZEMPIC, 0.25 OR 0.5 MG/DOSE,) 2 MG/3ML SOPN   Other Relevant Orders   Renal function panel   CBC with Differential/Platelet     Meds ordered this encounter  Medications   Semaglutide,0.25 or 0.5MG /DOS, (OZEMPIC, 0.25 OR 0.5 MG/DOSE,) 2 MG/3ML SOPN    Sig: Inject 0.25 mg into the skin once a week for 14 days, THEN 0.5 mg once a week.    Dispense:  3 mL    Refill:  3    Orders Placed This Encounter  Procedures   Renal function panel   CBC with Differential/Platelet   Urinalysis, Routine w reflex microscopic    Standing Status:   Future    Number of Occurrences:   1    Standing Expiration Date:   02/05/2024   POCT glycosylated hemoglobin (Hb A1C)    Patient Instructions  Sugar levels were too high - start ozempic 0.25mg  weekly for 2 weeks then increase to 0.5mg  weekly.  This was sent to your pharmacy. Continue other diabetes medicines. We will request latest eye exam from [place] Return in 4 months for physical (after 06/07/2023).   Follow up plan: Return in about 4 months (around 06/07/2023), or if symptoms worsen or fail to improve, for annual exam, prior fasting for blood work.  Eustaquio Boyden, MD

## 2023-02-05 NOTE — Assessment & Plan Note (Signed)
Chronic, deteriorated with A1c up to 10.1% - pt attributes to dietary liberties increased sweetened pastries and cakes.  Continue current regimen, add ozempic 0.25mg  weekly x 2 wks then increase to 0.5mg  weekly.  Reviewed mechanism of action of medication as well as side effects and adverse events to watch for including nausea, diarrhea, constipation, pancreatitis. No fmhx medullary thyroid cancer. Discussed titration schedule for medication.

## 2023-02-05 NOTE — Addendum Note (Signed)
Addended by: Eustaquio Boyden on: 02/05/2023 10:47 AM   Modules accepted: Level of Service

## 2023-02-05 NOTE — Patient Instructions (Addendum)
Sugar levels were too high - start ozempic 0.25mg  weekly for 2 weeks then increase to 0.5mg  weekly.  This was sent to your pharmacy. Continue other diabetes medicines. We will request latest eye exam from [place] Return in 4 months for physical (after 06/07/2023).

## 2023-02-05 NOTE — Assessment & Plan Note (Signed)
Update labs today 

## 2023-02-07 ENCOUNTER — Ambulatory Visit (INDEPENDENT_AMBULATORY_CARE_PROVIDER_SITE_OTHER): Payer: HMO

## 2023-02-07 VITALS — Ht 68.0 in | Wt 244.0 lb

## 2023-02-07 DIAGNOSIS — Z0000 Encounter for general adult medical examination without abnormal findings: Secondary | ICD-10-CM | POA: Diagnosis not present

## 2023-02-07 NOTE — Progress Notes (Signed)
Subjective:   Evan Avery is a 77 y.o. male who presents for Medicare Annual/Subsequent preventive examination.  I connected with  [name] on 02/07/23 by a audio enabled telemedicine application and verified that I am speaking with the correct person using two identifiers.  Patient Location: Home  Provider Location: Home Office  I discussed the limitations of evaluation and management by telemedicine. The patient expressed understanding and agreed to proceed.      Review of Systems     Cardiac Risk Factors include: advanced age (>41men, >31 women);diabetes mellitus;dyslipidemia;hypertension;male gender;obesity (BMI >30kg/m2);family history of premature cardiovascular disease     Objective:    Today's Vitals   02/07/23 1433  Weight: 244 lb (110.7 kg)  Height: 5\' 8"  (1.727 m)   Body mass index is 37.1 kg/m.     02/07/2023    2:46 PM 08/08/2022    6:00 PM 11/11/2020    6:39 AM 01/02/2020    9:45 AM 12/27/2018   12:51 PM 05/22/2018    7:38 AM 05/14/2018    2:25 PM  Advanced Directives  Does Patient Have a Medical Advance Directive? No No No No No No No  Would patient like information on creating a medical advance directive? No - Patient declined No - Patient declined No - Patient declined No - Patient declined No - Patient declined No - Patient declined No - Patient declined    Current Medications (verified) Outpatient Encounter Medications as of 02/07/2023  Medication Sig   aspirin EC 81 MG tablet Take 1 tablet (81 mg total) by mouth daily. Swallow whole.   atorvastatin (LIPITOR) 80 MG tablet Take 1 tablet (80 mg total) by mouth daily.   Brinzolamide-Brimonidine 1-0.2 % SUSP Apply 1 drop to eye 3 (three) times daily.    cetirizine (ZYRTEC) 10 MG tablet Take 1 tablet (10 mg total) by mouth daily.   clopidogrel (PLAVIX) 75 MG tablet Take 1 tablet by mouth once daily   Coenzyme Q10 (COQ10) 200 MG CAPS Take 200 mg by mouth daily.   empagliflozin (JARDIANCE) 10 MG  TABS tablet Take 1 tablet (10 mg total) by mouth daily before breakfast.   ezetimibe (ZETIA) 10 MG tablet Take 1 tablet (10 mg total) by mouth daily.   fluticasone (FLONASE) 50 MCG/ACT nasal spray Place 2 sprays into both nostrils daily.   furosemide (LASIX) 20 MG tablet Take 1 tablet (20 mg total) by mouth daily.   gabapentin (NEURONTIN) 100 MG capsule Take 100 mg by mouth 3 (three) times daily.   glimepiride (AMARYL) 2 MG tablet Take 1 tablet (2 mg total) by mouth daily before breakfast.   latanoprost (XALATAN) 0.005 % ophthalmic solution Place 1 drop into both eyes at bedtime.   metFORMIN (GLUCOPHAGE) 1000 MG tablet TAKE 1 TABLET BY MOUTH TWICE DAILY WITH MEALS   metoprolol succinate (TOPROL XL) 25 MG 24 hr tablet Take 0.5 tablets (12.5 mg total) by mouth daily.   nitroGLYCERIN (NITROSTAT) 0.4 MG SL tablet For chest pain, tightness, or pressure. While sitting, place 1 tablet under tongue. May be used every 5 minutes as needed, for up to 15 minutes. Do not use more than 3 tablets.   omega-3 acid ethyl esters (LOVAZA) 1 g capsule Take 2 capsules by mouth twice daily   sacubitril-valsartan (ENTRESTO) 49-51 MG Take 1 tablet by mouth 2 (two) times daily.   Semaglutide,0.25 or 0.5MG /DOS, (OZEMPIC, 0.25 OR 0.5 MG/DOSE,) 2 MG/3ML SOPN Inject 0.25 mg into the skin once a week for  14 days, THEN 0.5 mg once a week.   senna-docusate (SENOKOT-S) 8.6-50 MG tablet Take 1 tablet by mouth at bedtime as needed for mild constipation.   TURMERIC CURCUMIN PO Take 1 capsule by mouth daily.   No facility-administered encounter medications on file as of 02/07/2023.    Allergies (verified) Dilaudid [hydromorphone hcl], Ambien [zolpidem], and Niacin and related   History: Past Medical History:  Diagnosis Date   Agent orange exposure 1968   Arthritis    CAD (coronary artery disease)    4 stents in right artery   Complex tear of medial meniscus of left knee as current injury 07/26/2012   [place]   Diabetes  mellitus    type 2   Diverticulitis of colon with perforation 05/23/2016   DVT (deep venous thrombosis)    GERD (gastroesophageal reflux disease)    Glaucoma 2006   [facility]   Hx of colonic polyps    Hyperlipidemia    Hypertension    Intra-abdominal abscess 05/16/2016   LUMBAR RADICULOPATHY 07/18/2007   Qualifier: Diagnosis of  By: Lovell Sheehan MD, Balinda Quails    Myocardial infarction    1992   Sleep apnea    uses CPAP regularly   Past Surgical History:  Procedure Laterality Date   APPENDECTOMY  1958   BOWEL RESECTION N/A 05/23/2016   Procedure: SMALL BOWEL RESECTION;  Surgeon: Avel Peace, MD;  Location: [facility];  Service: General;  Laterality: N/A;   CARDIAC CATHETERIZATION  08/11/2008   angiographically patent LAD diagonal, circumflex and multiple circumflex obtuse marginal branches from the L coronary artery; stenting of mid R coronary artery with 3.5 x 28mm Liberte non DES postdilated w/ 4.0 DuraStent Tresa Endo)   CARDIOVASCULAR STRESS TEST  06/16/2011   R/P MV - moderate perfusion defect in basal inferoseptal, basal inferior, mid inferseptal and mid inferior regions, consistent with infarct/scar and/or diaphragmatic attenuation; non-gated study secondary to ectopy; no CP or EKG changes for ischemia; abnormal study although no significant changes from previous study; in the absense of gated images cannot calculate EF or distinguish scar/artifact   COLONOSCOPY  08/2016   TAx5, sessile serrated polyp, diverticulosis, rpt 2 yrs ([place])   COLONOSCOPY  10/2020   1.5cm SSP, HP, rpt Christella Hartigan)   COLONOSCOPY  08/2020   multiple polpys removed (TA, HP), 1.5cm polyp biopsied (SSP), colon lipoma Christella Hartigan)   COLONOSCOPY WITH PROPOFOL N/A 11/11/2020   Procedure: COLONOSCOPY WITH PROPOFOL;  Surgeon: Rachael Fee, MD;  Location: [facility];  Service: Endoscopy;  Laterality: N/A;   COLOSTOMY Left 05/23/2016   Procedure: COLOSTOMY;  Surgeon: Avel Peace, MD;  Location: [facility];  Service:  General;  Laterality: Left;   COLOSTOMY TAKEDOWN N/A 11/10/2016   Procedure: LAPAROSCOPIC LYSIS OF ADHESIONS AND  COLOSTOMY CLOSURE;  Surgeon: Avel Peace, MD;  Location: [facility];  Service: General;  Laterality: N/A;   CORONARY/GRAFT ACUTE MI REVASCULARIZATION N/A 08/06/2022   Procedure: Coronary/Graft Acute MI Revascularization;  Surgeon: Lyn Records, MD;  Location: [facility];  Service: Cardiovascular;  Laterality: N/A;   CYST EXCISION  04/2018   posterior neck ([place])   DOPPLER ECHOCARDIOGRAPHY  08/20/2008   EF >55%; LV systolic function normal; LV mildly dilated; doppler flow suggestive of impaired LV relaxation; RV mildly dilated, RV systolic function normal, RV systolic pressure normal   ESOPHAGOGASTRODUODENOSCOPY  08/2020   gastritis, duodenitis likely NSAID/ASA related, H pylori neg - rec stop aspirin Christella Hartigan)   INCISION AND DRAINAGE ABSCESS N/A 05/23/2016  Procedure: DRAINAGE OF MULTIPLE INTRA  ABDOMINAL ABSCESS;  Surgeon: Avel Peace, MD;  Location: [facility];  Service: General;  Laterality: N/A;   KNEE SURGERY Left 2015   torn meniscus Delbert Harness)   LAPAROSCOPIC LYSIS OF ADHESIONS  11/10/2016   Procedure: LAPAROSCOPIC LYSIS OF ADHESIONS;  Surgeon: Avel Peace, MD;  Location: [facility];  Service: General;;   LAPAROTOMY N/A 05/23/2016   Procedure: EXPLORATORY LAPAROTOMY;  Surgeon: Avel Peace, MD;  Location: [facility];  Service: General;  Laterality: N/A;   LEFT HEART CATH AND CORONARY ANGIOGRAPHY N/A 08/06/2022   Procedure: LEFT HEART CATH AND CORONARY ANGIOGRAPHY;  Surgeon: Lyn Records, MD;  [facility];  Service: Cardiovascular;  Laterality: N/A;   MASS EXCISION N/A 05/22/2018   Procedure: EXCISION OF CYST ON POSTERIOR NECK ERAS PATHWAY;  Surgeon: Harriette Bouillon, MD;  Location: [facility];  Service: General;  Laterality: N/A;   PERCUTANEOUS CORONARY STENT INTERVENTION (PCI-S)  1992, 1999, 2004   4 vessels Tresa Endo)   POLYPECTOMY   11/11/2020   Procedure: POLYPECTOMY;  Surgeon: Rachael Fee, MD;  [facility];  Service: Endoscopy;;   PROCTOSCOPY  11/10/2016   Procedure: PROCTOSCOPY;  Surgeon: Avel Peace, MD;  [facility];  Service: General;;   SUBMUCOSAL LIFTING INJECTION  11/11/2020   Procedure: SUBMUCOSAL LIFTING INJECTION;  Surgeon: Rachael Fee, MD;  Location: [facility];  Service: Endoscopy;;   Family History  Problem Relation Age of Onset   Alzheimer's disease Mother    Heart disease Father    Heart attack Brother    Diabetes Brother    Heart disease Brother    Heart disease Maternal Grandmother    Colon cancer Maternal Grandfather    Diabetes Paternal Grandfather    Diabetes Son    Esophageal cancer Neg Hx    Rectal cancer Neg Hx    Stomach cancer Neg Hx    Social History   Socioeconomic History   Marital status: Married    Spouse name: [name]   Number of children: 2   Years of education: Not on file   Highest education level: GED or equivalent  Occupational History   Occupation: retired  Tobacco Use   Smoking status: Former    Packs/day: 1.00    Years: 50.00    Additional pack years: 0.00    Total pack years: 50.00    Types: Cigarettes    Start date: 10/23/1958    Quit date: 08/05/2022    Years since quitting: 0.5   Smokeless tobacco: Never  Vaping Use   Vaping Use: Never used  Substance and Sexual Activity   Alcohol use: Yes    Alcohol/week: 0.0 standard drinks of alcohol    Comment: rarely-1 drink every 2-3 months   Drug use: No   Sexual activity: Yes  Other Topics Concern   Not on file  Social History Narrative   Lives with wife and son (with mental issues)   Occupation: retired, was Tax inspector for [employer]   Activity: no regular exercise   Diet: good water, fruits/vegetables daily   Social Determinants of Health   Financial Resource Strain: Medium Risk (08/09/2022)   Overall Financial Resource Strain (CARDIA)    Difficulty of Paying  Living Expenses: Somewhat hard  Food Insecurity: No Food Insecurity (02/07/2023)   Hunger Vital Sign    Worried About Running Out of Food in the Last Year: Never true    Ran Out of Food in the Last Year:  Never true  Transportation Needs: No Transportation Needs (02/07/2023)   PRAPARE - Administrator, Civil Service (Medical): No    Lack of Transportation (Non-Medical): No  Physical Activity: Insufficiently Active (02/07/2023)   Exercise Vital Sign    Days of Exercise per Week: 2 days    Minutes of Exercise per Session: 20 min  Stress: No Stress Concern Present (02/07/2023)   [employer] of Occupational Health - Occupational Stress Questionnaire    Feeling of Stress : Not at all  Social Connections: Socially Integrated (02/07/2023)   Social Connection and Isolation Panel [NHANES]    Frequency of Communication with Friends and Family: Three times a week    Frequency of Social Gatherings with Friends and Family: More than three times a week    Attends Religious Services: More than 4 times per year    Active Member of Clubs or Organizations: Yes    Attends Banker Meetings: 1 to 4 times per year    Marital Status: Married    Tobacco Counseling Counseling given: Not Answered   Clinical Intake:  Pre-visit preparation completed: Yes  Pain : No/denies pain     BMI - recorded: 37.1 Nutritional Status: BMI > 30  Obese Nutritional Risks: None Diabetes: No  How often do you need to have someone help you when you read instructions, pamphlets, or other written materials from your doctor or pharmacy?: 1 - Never  Diabetic? Yes  Interpreter Needed?: No  Information entered by :: Kandis Cocking, CMA   Activities of Daily Living    02/07/2023    2:49 PM 08/08/2022    6:00 PM  In your present state of health, do you have any difficulty performing the following activities:  Hearing? 0 0  Vision? 0 0  Difficulty concentrating or making decisions? 0 0   Walking or climbing stairs? 0 0  Dressing or bathing? 0 0  Doing errands, shopping? 0 0  Preparing Food and eating ? N   Using the Toilet? N   In the past six months, have you accidently leaked urine? N   Do you have problems with loss of bowel control? N   Managing your Medications? N   Managing your Finances? N   Housekeeping or managing your Housekeeping? N     Patient Care Team: Eustaquio Boyden, MD as PCP - General (Family Medicine) Lennette Bihari, MD as PCP - Cardiology (Cardiology) Lennette Bihari, MD as Consulting Physician (Cardiology) Avel Peace, MD as Consulting Physician (General Surgery) Kathyrn Sheriff, [facility] as Pharmacist (Pharmacist)  Indicate any recent Medical Services you may have received from other than [facility] providers in the past year (date may be approximate).     Assessment:   This is a routine wellness examination for Gianmarco.  Hearing/Vision screen Hearing Screening - Comments:: Denies hearing difficulties   Vision Screening - Comments:: Wears reading rx glasses - up to date with routine eye exams.   Laser surgery on right eye-12/2022.  Last complete eye exam-10/2022  Dietary issues and exercise activities discussed: Current Exercise Habits: Home exercise routine;The patient does not participate in regular exercise at present (patient does yard work), Intensity: Mild   Goals Addressed             This Visit's Progress    Patient Stated       Lose weight       Depression Screen    02/07/2023    2:51 PM 02/05/2023   10:48  AM 06/06/2022    9:32 AM 01/10/2021    8:02 AM 01/02/2020    9:47 AM 12/27/2018   12:51 PM 12/14/2017    9:32 AM  PHQ 2/9 Scores  PHQ - 2 Score 0 0 0 0 0 0 0  PHQ- 9 Score     0 0 0    Fall Risk    02/07/2023    2:40 PM 02/05/2023   10:47 AM 01/10/2021    8:02 AM 01/02/2020    9:46 AM 12/27/2018   12:51 PM  Fall Risk   Falls in the past year? 1 1 0 0 0  Number falls in past yr: 0 0  0   Comment pt had heart  attack in 10/23 fell back on floor      Injury with Fall? 1 1  0   Comment pt had heart attack in Oct 2023      Risk for fall due to :    Medication side effect   Follow up   Falls evaluation completed Falls evaluation completed;Falls prevention discussed     FALL RISK PREVENTION PERTAINING TO THE HOME:  Any stairs in or around the home? Yes  If so, are there any without handrails? No  Home free of loose throw rugs in walkways, pet beds, electrical cords, etc? Yes  Adequate lighting in your home to reduce risk of falls? Yes   ASSISTIVE DEVICES UTILIZED TO PREVENT FALLS:  Life alert? No  Use of a cane, walker or w/c? No  Grab bars in the bathroom? No  Shower chair or bench in shower? No  Elevated toilet seat or a handicapped toilet? No   TIMED UP AND GO:  Was the test performed? No .   Televisit  Cognitive Function:    01/02/2020    9:49 AM 12/27/2018    1:51 PM 12/14/2017    9:36 AM 12/06/2016    8:17 AM  MMSE - Mini Mental State Exam  Orientation to time Orientation to Place Registration Attention/ Calculation 5 0 0 0  Recall Language- name 2 objects  0 0 0  Language- repeat Language- follow 3 step command  Language- read & follow direction  0 0 0  Write a sentence  0 0 0  Copy design  0 0 0  Total score  02/07/2023    2:50 PM  6CIT Screen  What Year? 0 points  What month? 0 points  What time? 0 points  Count back from 20 0 points  Months in reverse 0 points  Repeat phrase 0 points  Total Score 0 points    Immunizations Immunization History  Administered Date(s) Administered   Fluad Quad(high Dose 65+) 07/29/2019, 08/26/2021, 08/30/2022   Influenza Split 08/02/2012   Influenza, High Dose Seasonal PF 07/07/2020   Influenza,inj,Quad PF,6+ Mos 09/17/2013, 09/07/2014, 09/06/2016, 11/07/2017   Influenza-Unspecified 09/28/2015, 07/23/2018   PFIZER(Purple Top)SARS-COV-2 Vaccination 12/15/2019,  01/05/2020   Pneumococcal Conjugate-13 10/23/2013   Pneumococcal Polysaccharide-23 04/13/2015   Pneumococcal-Unspecified 09/24/2008, 10/23/2012   Td 10/23/2000, 10/11/2021   Tdap 10/23/2012    TDAP status: Up to date  Flu Vaccine status: Up to date  Pneumococcal vaccine status: Up to date  Covid-19 vaccine status: Declined, Education has been provided regarding the importance of this  vaccine but patient still declined. Advised may receive this vaccine at local pharmacy or Health Dept.or vaccine clinic. Aware to provide a copy of the vaccination record if obtained from local pharmacy or Health Dept. Verbalized acceptance and understanding.  Qualifies for Shingles Vaccine? Yes   Zostavax completed No   Shingrix Completed?: No.    Education has been provided regarding the importance of this vaccine. Patient has been advised to call insurance company to determine out of pocket expense if they have not yet received this vaccine. Advised may also receive vaccine at local pharmacy or Health Dept. Verbalized acceptance and understanding.  Screening Tests Health Maintenance  Topic Date Due   Zoster Vaccines- Shingrix (1 of 2) Never done   COVID-19 Vaccine (3 - Pfizer risk series) 02/02/2020   Lung Cancer Screening  04/07/2020   COLONOSCOPY (Pts 45-56yrs Insurance coverage will need to be confirmed)  11/11/2022   OPHTHALMOLOGY EXAM  12/22/2022   INFLUENZA VACCINE  05/24/2023   Diabetic kidney evaluation - Urine ACR  05/31/2023   HEMOGLOBIN A1C  08/07/2023   FOOT EXAM  10/07/2023   Diabetic kidney evaluation - eGFR measurement  02/05/2024   Medicare Annual Wellness (AWV)  02/07/2024   DTaP/Tdap/Td (4 - Td or Tdap) 10/12/2031   Pneumonia Vaccine 5+ Years old  Completed   Hepatitis C Screening  Completed   HPV VACCINES  Aged Out    Health Maintenance  Health Maintenance Due  Topic Date Due   Zoster Vaccines- Shingrix (1 of 2) Never done   COVID-19 Vaccine (3 - Pfizer risk series)  02/02/2020   Lung Cancer Screening  04/07/2020   COLONOSCOPY (Pts 45-18yrs Insurance coverage will need to be confirmed)  11/11/2022   OPHTHALMOLOGY EXAM  12/22/2022    Colorectal cancer screening: Type of screening: Colonoscopy. Completed 11/11/20. Repeat every 2 years  Lung Cancer Screening: (Low Dose CT Chest recommended if Age 12-80 years, 30 pack-year currently smoking OR have quit w/in 15years.) does qualify.   Lung Cancer Screening Referral: Patient requested to wait on testing  Additional Screening:  Hepatitis C Screening: does qualify; Completed 08/09/2018  Vision Screening: Recommended annual ophthalmology exams for early detection of glaucoma and other disorders of the eye. Is the patient up to date with their annual eye exam?  Yes   Who is the provider or what is the name of the office in which the patient attends annual eye exams?  10/2022 at [facility]  If pt is not established with a provider, would they like to be referred to a provider to establish care? No .   Dental Screening: Recommended annual dental exams for proper oral hygiene  Community Resource Referral / Chronic Care Management: CRR required this visit?  No   CCM required this visit?  No      Plan:     I have personally reviewed and noted the following in the patient's chart:   Medical and social history Use of alcohol, tobacco or illicit drugs  Current medications and supplements including opioid prescriptions. Patient is not currently taking opioid prescriptions. Functional ability and status Nutritional status Physical activity Advanced directives List of other physicians Hospitalizations, surgeries, and ER visits in previous 12 months Vitals Screenings to include cognitive, depression, and falls Referrals and appointments  In addition, I have reviewed and discussed with patient certain preventive protocols, quality metrics, and best practice recommendations. A written personalized care plan for  preventive services as well as general preventive health recommendations were provided to patient.  Milus Mallick, CMA   02/07/2023   Nurse Notes: Patient requesting to wait on Colonoscopy, Lung cancer screening exam, also Colonoscopy.    Declined COVID vaccine,

## 2023-02-07 NOTE — Patient Instructions (Signed)
Evan Avery , Thank you for taking time to come for your Medicare Wellness Visit. I appreciate your ongoing commitment to your health goals. Please review the following plan we discussed and let me know if I can assist you in the future.   These are the goals we discussed:  Goals      DIET - INCREASE WATER INTAKE     Starting 12/27/2018, I will continue to drink at least 6-8 glasses of water daily.      Patient Stated     01/02/2020, I will maintain and continue medications as prescribed.      Patient Stated     Lose weight        This is a list of the screening recommended for you and due dates:  Health Maintenance  Topic Date Due   Zoster (Shingles) Vaccine (1 of 2) Never done   COVID-19 Vaccine (3 - Pfizer risk series) 02/02/2020   Screening for Lung Cancer  04/07/2020   Colon Cancer Screening  11/11/2022   Flu Shot  05/24/2023   Yearly kidney health urinalysis for diabetes  05/31/2023   Hemoglobin A1C  08/07/2023   Complete foot exam   10/07/2023   Eye exam for diabetics  10/25/2023   Yearly kidney function blood test for diabetes  02/05/2024   Medicare Annual Wellness Visit  02/07/2024   DTaP/Tdap/Td vaccine (4 - Td or Tdap) 10/12/2031   Pneumonia Vaccine  Completed   Hepatitis C Screening: USPSTF Recommendation to screen - Ages 40-79 yo.  Completed   HPV Vaccine  Aged Out    Advanced directives: Discussed patient will obtain copy from office  Conditions/risks identified:   Keep up the good work  Next appointment: Follow up in one year for your annual wellness visit. 02/12/24  Preventive Care 65 Years and Older, Male  Preventive care refers to lifestyle choices and visits with your health care provider that can promote health and wellness. What does preventive care include? A yearly physical exam. This is also called an annual well check. Dental exams once or twice a year. Routine eye exams. Ask your health care provider how often you should have your eyes  checked. Personal lifestyle choices, including: Daily care of your teeth and gums. Regular physical activity. Eating a healthy diet. Avoiding tobacco and drug use. Limiting alcohol use. Practicing safe sex. Taking low doses of aspirin every day. Taking vitamin and mineral supplements as recommended by your health care provider. What happens during an annual well check? The services and screenings done by your health care provider during your annual well check will depend on your age, overall health, lifestyle risk factors, and family history of disease. Counseling  Your health care provider may ask you questions about your: Alcohol use. Tobacco use. Drug use. Emotional well-being. Home and relationship well-being. Sexual activity. Eating habits. History of falls. Memory and ability to understand (cognition). Work and work Astronomer. Screening  You may have the following tests or measurements: Height, weight, and BMI. Blood pressure. Lipid and cholesterol levels. These may be checked every 5 years, or more frequently if you are over 72 years old. Skin check. Lung cancer screening. You may have this screening every year starting at age 31 if you have a 30-pack-year history of smoking and currently smoke or have quit within the past 15 years. Fecal occult blood test (FOBT) of the stool. You may have this test every year starting at age 22. Flexible sigmoidoscopy or colonoscopy. You may have  a sigmoidoscopy every 5 years or a colonoscopy every 10 years starting at age 58. Prostate cancer screening. Recommendations will vary depending on your family history and other risks. Hepatitis C blood test. Hepatitis B blood test. Sexually transmitted disease (STD) testing. Diabetes screening. This is done by checking your blood sugar (glucose) after you have not eaten for a while (fasting). You may have this done every 1-3 years. Abdominal aortic aneurysm (AAA) screening. You may need this  if you are a current or former smoker. Osteoporosis. You may be screened starting at age 80 if you are at high risk. Talk with your health care provider about your test results, treatment options, and if necessary, the need for more tests. Vaccines  Your health care provider may recommend certain vaccines, such as: Influenza vaccine. This is recommended every year. Tetanus, diphtheria, and acellular pertussis (Tdap, Td) vaccine. You may need a Td booster every 10 years. Zoster vaccine. You may need this after age 40. Pneumococcal 13-valent conjugate (PCV13) vaccine. One dose is recommended after age 60. Pneumococcal polysaccharide (PPSV23) vaccine. One dose is recommended after age 74. Talk to your health care provider about which screenings and vaccines you need and how often you need them. This information is not intended to replace advice given to you by your health care provider. Make sure you discuss any questions you have with your health care provider. Document Released: 11/05/2015 Document Revised: 06/28/2016 Document Reviewed: 08/10/2015 Elsevier Interactive Patient Education  2017 [employer] Prevention in the Home Falls can cause injuries. They can happen to people of all ages. There are many things you can do to make your home safe and to help prevent falls. What can I do on the outside of my home? Regularly fix the edges of walkways and driveways and fix any cracks. Remove anything that might make you trip as you walk through a door, such as a raised step or threshold. Trim any bushes or trees on the path to your home. Use bright outdoor lighting. Clear any walking paths of anything that might make someone trip, such as rocks or tools. Regularly check to see if handrails are loose or broken. Make sure that both sides of any steps have handrails. Any raised decks and porches should have guardrails on the edges. Have any leaves, snow, or ice cleared regularly. Use sand  or salt on walking paths during winter. Clean up any spills in your garage right away. This includes oil or grease spills. What can I do in the bathroom? Use night lights. Install grab bars by the toilet and in the tub and shower. Do not use towel bars as grab bars. Use non-skid mats or decals in the tub or shower. If you need to sit down in the shower, use a plastic, non-slip stool. Keep the floor dry. Clean up any water that spills on the floor as soon as it happens. Remove soap buildup in the tub or shower regularly. Attach bath mats securely with double-sided non-slip rug tape. Do not have throw rugs and other things on the floor that can make you trip. What can I do in the bedroom? Use night lights. Make sure that you have a light by your bed that is easy to reach. Do not use any sheets or blankets that are too big for your bed. They should not hang down onto the floor. Have a firm chair that has side arms. You can use this for support while you get dressed. Do  not have throw rugs and other things on the floor that can make you trip. What can I do in the kitchen? Clean up any spills right away. Avoid walking on wet floors. Keep items that you use a lot in easy-to-reach places. If you need to reach something above you, use a strong step stool that has a grab bar. Keep electrical cords out of the way. Do not use floor polish or wax that makes floors slippery. If you must use wax, use non-skid floor wax. Do not have throw rugs and other things on the floor that can make you trip. What can I do with my stairs? Do not leave any items on the stairs. Make sure that there are handrails on both sides of the stairs and use them. Fix handrails that are broken or loose. Make sure that handrails are as long as the stairways. Check any carpeting to make sure that it is firmly attached to the stairs. Fix any carpet that is loose or worn. Avoid having throw rugs at the top or bottom of the stairs.  If you do have throw rugs, attach them to the floor with carpet tape. Make sure that you have a light switch at the top of the stairs and the bottom of the stairs. If you do not have them, ask someone to add them for you. What else can I do to help prevent falls? Wear shoes that: Do not have high heels. Have rubber bottoms. Are comfortable and fit you well. Are closed at the toe. Do not wear sandals. If you use a stepladder: Make sure that it is fully opened. Do not climb a closed stepladder. Make sure that both sides of the stepladder are locked into place. Ask someone to hold it for you, if possible. Clearly mark and make sure that you can see: Any grab bars or handrails. First and last steps. Where the edge of each step is. Use tools that help you move around (mobility aids) if they are needed. These include: Canes. Walkers. Scooters. Crutches. Turn on the lights when you go into a dark area. Replace any light bulbs as soon as they burn out. Set up your furniture so you have a clear path. Avoid moving your furniture around. If any of your floors are uneven, fix them. If there are any pets around you, be aware of where they are. Review your medicines with your doctor. Some medicines can make you feel dizzy. This can increase your chance of falling. Ask your doctor what other things that you can do to help prevent falls. This information is not intended to replace advice given to you by your health care provider. Make sure you discuss any questions you have with your health care provider. Document Released: 08/05/2009 Document Revised: 03/16/2016 Document Reviewed: 11/13/2014 Elsevier Interactive Patient Education  2017 [employer].

## 2023-02-27 ENCOUNTER — Ambulatory Visit (HOSPITAL_COMMUNITY): Payer: PPO | Attending: Cardiovascular Disease

## 2023-02-27 DIAGNOSIS — I255 Ischemic cardiomyopathy: Secondary | ICD-10-CM | POA: Diagnosis not present

## 2023-02-27 LAB — ECHOCARDIOGRAM COMPLETE
Area-P 1/2: 3.53 cm2
S' Lateral: 2.8 cm

## 2023-02-27 MED ORDER — PERFLUTREN LIPID MICROSPHERE
1.0000 mL | INTRAVENOUS | Status: AC | PRN
Start: 2023-02-27 — End: 2023-02-27
  Administered 2023-02-27: 3 mL via INTRAVENOUS

## 2023-03-01 ENCOUNTER — Telehealth: Payer: Self-pay

## 2023-03-01 NOTE — Telephone Encounter (Signed)
Patient Advocate Encounter  Prior Authorization for Ozempic (0.25 or 0.5 MG/DOSE) 2MG /3ML pen-injectors has been approved through [employer].    KeyDoristine Church  Effective: 02-23-2023 to 02-23-2024

## 2023-04-13 ENCOUNTER — Ambulatory Visit: Payer: PPO | Attending: Cardiovascular Disease | Admitting: Cardiovascular Disease

## 2023-04-13 VITALS — BP 100/60 | HR 69 | Ht 68.0 in | Wt 236.8 lb

## 2023-04-13 DIAGNOSIS — G4733 Obstructive sleep apnea (adult) (pediatric): Secondary | ICD-10-CM

## 2023-04-13 DIAGNOSIS — I255 Ischemic cardiomyopathy: Secondary | ICD-10-CM

## 2023-04-13 DIAGNOSIS — E669 Obesity, unspecified: Secondary | ICD-10-CM

## 2023-04-13 DIAGNOSIS — Z7984 Long term (current) use of oral hypoglycemic drugs: Secondary | ICD-10-CM

## 2023-04-13 DIAGNOSIS — E118 Type 2 diabetes mellitus with unspecified complications: Secondary | ICD-10-CM | POA: Diagnosis not present

## 2023-04-13 DIAGNOSIS — I25119 Atherosclerotic heart disease of native coronary artery with unspecified angina pectoris: Secondary | ICD-10-CM

## 2023-04-13 DIAGNOSIS — E785 Hyperlipidemia, unspecified: Secondary | ICD-10-CM | POA: Diagnosis not present

## 2023-04-13 NOTE — Patient Instructions (Signed)
Medication Instructions:  Continue same medications *If you need a refill on your cardiac medications before your next appointment, please call your pharmacy*   Lab Work: None ordered   Testing/Procedures: None ordered   Follow-Up: At [facility], you and your health needs are our priority.  As part of our continuing mission to provide you with exceptional heart care, we have created designated Provider Care Teams.  These Care Teams include your primary Cardiologist (physician) and Advanced Practice Providers (APPs -  Physician Assistants and Nurse Practitioners) who all work together to provide you with the care you need, when you need it.  We recommend signing up for the patient portal called "MyChart".  Sign up information is provided on this After Visit Summary.  MyChart is used to connect with patients for Virtual Visits (Telemedicine).  Patients are able to view lab/test results, encounter notes, upcoming appointments, etc.  Non-urgent messages can be sent to your provider as well.   To learn more about what you can do with MyChart, go to [email or web address].    Your next appointment:  6 months    Provider:  Dr.Kelly   

## 2023-04-13 NOTE — Progress Notes (Unsigned)
Patient ID: Evan Avery, male   DOB: May 21, 1946, 77 y.o.   MRN: [id number]      Primary M.D.: Dr. Sharen Hones  HPI: Evan Avery is a 77 y.o. male who presents today for a 4 month cardiology evaluation.  Evan Avery has known CAD and has a total of 4 stents in his RCA. In 1992, he underwent initial PTCA of his RCA. Subsequently, he underwent stenting of the proximal and mid RCA.  In 1999 his fourth stent was placed in the mid RCA between the 2 previously placed stents and was a 3.5x28 mm non-DES Liberty stent. His last nuclear perfusion study in August 2012 showed inferior thinning without significant ischemia.  EF was not calculated secondary to  ectopy but previously had been 52%.  Additional problems include hypertension, hyperlipidemia, obesity, and obstructive sleep apnea for which he is he uses CPAP 100% of the time and uses  [facility] for his [employer] [facility].  Since I last saw him in May 2017.  He denied any recurrent episodes of chest pain or significant dyspnea.  He had a new CPAP machine and continue to be compliant.  He established primary care with Dr. Sharen Hones.  Review of laboratory that he had with him in December 2016 has shown a cholesterol of 149, HDL 27, but his triglycerides were 413.  Remotely he had triglyceride elevations as high as 584.  In December, his hemoglobin A1c was 7.7.  He underwent surgery in July 2017 due to an abscess in his colon.  He required a colostomy for 6 months and in January 2018 underwent colostomy closure.   I saw him in October 2018 after not having seen him in over 17 months.  Since that evaluation, he has undergone 2 resections for cyst on the back of his neck with recurrent infections.  He recently saw Dr. Luisa Hart consideration of surgical excision.  He has felt well with reference to chest pain or shortness of breath.  He had smoked for many years but quit smoking for 10 years but unfortunately resumed smoking in June 2018 and currently  smoking 1 pack/day.  He has not had a nuclear perfusion study since 2012.  He continues to use CPAP with 100% compliance.  He was  started on Januvia for his diabetes mellitus by his primary physician.    When I saw hime in June 2019  he was in need of back and neck surgery.  I recommended he undergo a Lexiscan Myoview study for preoperative assessment.  This was done on April 23, 2018 which showed a small inferior defect, low risk.  EF was 50% with basal inferior hypokinesis.  He tolerated his neck and back surgery well.  Is only he is without anginal symptoms.  He has a documented umbilical/ventral  hernia and has been without abdominal discomfort.  He continues to use CPAP with 100% compliance.  He received a new CPAP machine at the [facility] is a ResMed AirSense 10.  He gets his supplies from the [place].    I saw him in February 2020 and  saw him in January 2021.  He denied any chest pain  with walking but does admit to some mild shortness of breath.  Unfortunately he still smokes 1 pack of cigarettes per day.  He had experienced some throat burning which had improved with pantoprazole.  He has an umbilical hernia and ventral hernia but denies any pain.  He continues to be on aspirin and Plavix for  DAPT.  He is on atorvastatin 40mg ,  Zetia 10 mg and Lovaza 2 capsules twice daily for mixed hyperlipidemia.  He continues to be on lisinopril 5 mg and atenolol 50 mg in the morning and 25 mg in the evening for hypertension.  There are no palpitations.  He is diabetic on glimepiride and Metformin.  He was bradycardic but was entirely asymptomatic.  I saw him in July 2021 at that time he stated that he had experienced  several episodes of chest heaviness.  His episodes were short-lived times he would take Tums with benefit.  Unfortunately he still smokes cigarettes.  He has been drinking an excessive amount of coffee typically at least 6 cups/day.  There was some dyspepsia and he recently started taking Pepcid with  some improvement.  He apparently is need to undergo endoscopy to be done at the [place]. during that evaluation, I again discussed the importance of smoking cessation.  With his longstanding history of CAD, and recent chest discomfort I recommended he undergo 2-year follow-up Lexiscan Myoview for further evaluation.    He underwent a [place] Myoview study on May 11, 2020.  There was evidence for small size mild fixed mid inferior perfusion defect most likely scar.  There was no associated ischemia.  EF was 43% with basal to mid inferior hypokinesis.  There was no change from the prior study in 2019.  I saw him on June 21, 2020 at which time he felt improved.  He denied any recurrent chest pain or exertional symptoms.  He was trying to significantly reduce his tobacco which previously had been 1 to 2 packs/day and most recently was 1 pack/day.  During that evaluation, his ECG showed marked sinus bradycardia at 43 bpm.  I recommended he reduce his a atenolol to 25 mg twice a day.  I saw him on December 20, 2020 and since his prior evaluation he had remained cardiovascularly stable.    He has issues with GERD and is now on Pepcid 40 mg twice daily followed by Dr. Christella Hartigan of GI.  He continues to use CPAP which is now followed at the [place].  Unfortunately he is still smoking 1 pack/day.  He will be undergoing repeat laboratory in 2 weeks with Dr. Sharen Hones.  He denied any recurrent chest pain PND orthopnea, palpitations, presyncope or syncope.  I saw him on 15-Nov-2021.  Unfortunately his son died at 12 in 20-Feb-2021.  He had significant health related issues prior to his death.  Evan Avery denies any chest pain.  He is still smoking approximately 1 pack/day.  He continues to use CPAP 100% compliance.  He continues to have issues with GERD and is on therapy.  His last stress test was done in July 2021 which showed a nuclear EF at 43%.  There was evidence for fixed mid inferior perfusion defect most likely scar.   At times he does note a vague chest sensation which he feels may be his GERD, but at times he did experience some left arm radiation.  He denies presyncope or syncope.  He is unaware of palpitations.  At times he has experienced some mild ankle.  His ECG showed sinus bradycardia at 43 bpm and I recommended he reduce atenolol from 25 mg twice a day to 25 mg in the morning and 12.5 mg at night.  I also empirically added isosorbide 30 mg to his medical regimen to provide potential anti-ischemic benefit.  On August 06, 2022 he suffered a  witnessed cardiac arrest at home.  His wife was there and a bystander CPR was initiated.  EMS arrived and he received 6 shocks for VF before ROSC.  He was intubated.  He underwent cardiac catheterization by Dr. Katrinka Blazing and was found to have 99% mid RCA stenosis just above the previously placed bare-metal stent.  There were left-to-right collaterals.  The RCA was heavily calcified with the proximal segment having 50% stenosis.  He underwent PCI with overlapping synergy DES 3.5 x 28 mm stent inserted postdilated to 3.75 mm.  The 99% lesion was reduced to 0 but there was persistent underexpansion of approximately 50% in the more proximal segment.  Acute EF was estimated at 30 to 35% with severe hypokinesis to akinesis in the inferior wall basal to apical.  On August 07, 2022 echo Doppler study showed EF now at 45 to 50% with inferior hypokinesis.  There was mild biatrial dilation, mild mitral annular calcification as well as mild aortic calcification with aortic sclerosis without stenosis.  He was ultimately discharged on August 10, 2022 on aspirin/Plavix, Jardiance 10 mg, Entresto 24/26 mL twice a day, furosemide 20 mg to take as needed in addition to atorvastatin 80 mg, Zetia 10 mg and Lovaza 2 capsules twice a day, metformin and glimepiride for diabetes, and Neurontin.  He was evaluated on August 23, 2022 in the transition of care clinic heart failure pharmacy encounter and  samples were provided.  I saw him on September 07, 2022 at which time he felt well and denied any recurrent chest pain or shortness of breath.  He denied any PND, orthopnea, presyncope or syncope and was unaware of palpitations.  Was continuing to use CPAP prescribed at the [place] where he gets his supplies.  However he has not had any follow-up assessment of his AHI result.    I saw him on November 28, 2022. Since I saw him continues to feel well and has been without recurrent chest pain or shortness of breath.  He is unaware of palpitations.  He continues to be on aspirin/Plavix for DAPT.  He is on atorvastatin 80 mg, Zetia 10 mg Lovaza 2 g twice a day for mixed hyperlipidemia.  With reference to his used LV dysfunction he is on Jardiance 10 mg, Entresto 49/51 mg twice a day, furosemide 20 mg daily and metoprolol succinate 12.5 mg.  He is diabetic on metformin.  He has continued to use CPAP.  Apparently we were able to obtain data from his CPAP machine.  He tells me he has never seen a doctor regarding his pressures.  He is compliant with excellent use averaging 9 hours and 4 minutes per night.  His pressure is set at a range of 5 to 12 cm.  AHI is elevated at 12.4 with an apnea index of 10.9 and central apnea index of 2.7.  He does have a mask leak.  His 95th percentile pressure is 11.1 with maximum average pressure 11.8.   Since I last saw him he has continued to do well.  On his current medical regimen, he denies any recent chest pain or significant shortness of breath.  He is unaware of any recent palpitations.  An echo Doppler study done on Feb 27, 2023 shows complete recovery of global LV function with a EF at 60 to 65%.  He has mild LVH and mild hypokinesis of the basal inferior to inferolateral wall.  He has mild aortic sclerosis without stenosis.  EF has significantly improved from prior postcardiac  arrest study.  He has continued to use CPAP which supposedly is followed at the [place] but he has not had an  evaluation recently.  His device is also linked to our office.  He has a ResMed air sense 10 AutoSet unit which apparently has been set at a pressure range of 7 to 14 cm.  At times he sleeps okay but other times he does not feel like his sleep is good.  He has nocturia 2-3 times per night.  On his download that we were able to obtain today from May 21 through April 11, 2023 he has 100% usage compliance with average use at 9 hours per night.  AHI is significantly elevated at 18.2 and he does have some central events with a central apnea index of 7.4.  He has recently been started on Ozempic for the past several weeks.  He presents for reevaluation.  Past Medical History:  Diagnosis Date   Agent orange exposure 1968   Arthritis    CAD (coronary artery disease)    4 stents in right artery   Complex tear of medial meniscus of left knee as current injury 07/26/2012   [place]   Diabetes mellitus    type 2   Diverticulitis of colon with perforation 05/23/2016   DVT (deep venous thrombosis) (HCC)    GERD (gastroesophageal reflux disease)    Glaucoma 2006   [facility] drops[facility]   Hx of colonic polyps    Hyperlipidemia    Hypertension    Intra-abdominal abscess (HCC) 05/16/2016   LUMBAR RADICULOPATHY 07/18/2007   Qualifier: Diagnosis of  By: Lovell Sheehan MD, John E    Myocardial infarction [facility])    1992   Sleep apnea    uses CPAP regularly    Past Surgical History:  Procedure Laterality Date   APPENDECTOMY  1958   BOWEL RESECTION N/A 05/23/2016   Procedure: SMALL BOWEL RESECTION;  Surgeon: Avel Peace, MD;  Location: [facility];  Service: General;  Laterality: N/A;   CARDIAC CATHETERIZATION  08/11/2008   angiographically patent LAD diagonal, circumflex and multiple circumflex obtuse marginal branches from the L coronary artery; stenting of mid R coronary artery with 3.5 x 28mm Liberte non DES postdilated w/ 4.0 DuraStent Tresa Endo)   CARDIOVASCULAR STRESS TEST  06/16/2011   R/P MV - moderate perfusion  defect in basal inferoseptal, basal inferior, mid inferseptal and mid inferior regions, consistent with infarct/scar and/or diaphragmatic attenuation; non-gated study secondary to ectopy; no CP or EKG changes for ischemia; abnormal study although no significant changes from previous study; in the absense of gated images cannot calculate EF or distinguish scar/artifact   COLONOSCOPY  08/2016   TAx5, sessile serrated polyp, diverticulosis, rpt 2 yrs ([place])   COLONOSCOPY  10/2020   1.5cm SSP, HP, rpt Christella Hartigan)   COLONOSCOPY  08/2020   multiple polpys removed (TA, HP), 1.5cm polyp biopsied (SSP), colon lipoma Christella Hartigan)   COLONOSCOPY WITH PROPOFOL N/A 11/11/2020   Procedure: COLONOSCOPY WITH PROPOFOL;  Surgeon: Rachael Fee, MD;  Location: [facility];  Service: Endoscopy;  Laterality: N/A;   COLOSTOMY Left 05/23/2016   Procedure: COLOSTOMY;  Surgeon: Avel Peace, MD;  Location: [facility];  Service: General;  Laterality: Left;   COLOSTOMY TAKEDOWN N/A 11/10/2016   Procedure: LAPAROSCOPIC LYSIS OF ADHESIONS AND  COLOSTOMY CLOSURE;  Surgeon: Avel Peace, MD;  Location: [facility];  Service: General;  Laterality: N/A;   CORONARY/GRAFT ACUTE MI REVASCULARIZATION N/A 08/06/2022   Procedure: Coronary/Graft Acute MI Revascularization;  Surgeon: Lyn Records, MD;  Location: [facility] INVASIVE CV LAB;  Service: Cardiovascular;  Laterality: N/A;   CYST EXCISION  04/2018   posterior neck ([place])   DOPPLER ECHOCARDIOGRAPHY  08/20/2008   EF >55%; LV systolic function normal; LV mildly dilated; doppler flow suggestive of impaired LV relaxation; RV mildly dilated, RV systolic function normal, RV systolic pressure normal   ESOPHAGOGASTRODUODENOSCOPY  08/2020   gastritis, duodenitis likely NSAID/ASA related, H pylori neg - rec stop aspirin Christella Hartigan)   INCISION AND DRAINAGE ABSCESS N/A 05/23/2016   Procedure: DRAINAGE OF MULTIPLE INTRA  ABDOMINAL ABSCESS;  Surgeon: Avel Peace, MD;  Location: [facility];  Service:  General;  Laterality: N/A;   KNEE SURGERY Left 2015   torn meniscus Delbert Harness)   LAPAROSCOPIC LYSIS OF ADHESIONS  11/10/2016   Procedure: LAPAROSCOPIC LYSIS OF ADHESIONS;  Surgeon: Avel Peace, MD;  Location: [facility];  Service: General;;   LAPAROTOMY N/A 05/23/2016   Procedure: EXPLORATORY LAPAROTOMY;  Surgeon: Avel Peace, MD;  Location: [facility];  Service: General;  Laterality: N/A;   LEFT HEART CATH AND CORONARY ANGIOGRAPHY N/A 08/06/2022   Procedure: LEFT HEART CATH AND CORONARY ANGIOGRAPHY;  Surgeon: Lyn Records, MD;  Location: [facility] INVASIVE CV LAB;  Service: Cardiovascular;  Laterality: N/A;   MASS EXCISION N/A 05/22/2018   Procedure: EXCISION OF CYST ON POSTERIOR NECK ERAS PATHWAY;  Surgeon: Harriette Bouillon, MD;  Location: [facility];  Service: General;  Laterality: N/A;   PERCUTANEOUS CORONARY STENT INTERVENTION (PCI-S)  1992, 1999, 2004   4 vessels Tresa Endo)   POLYPECTOMY  11/11/2020   Procedure: POLYPECTOMY;  Surgeon: Rachael Fee, MD;  Location: [facility];  Service: Endoscopy;;   PROCTOSCOPY  11/10/2016   Procedure: PROCTOSCOPY;  Surgeon: Avel Peace, MD;  Location: [facility];  Service: General;;   SUBMUCOSAL LIFTING INJECTION  11/11/2020   Procedure: SUBMUCOSAL LIFTING INJECTION;  Surgeon: Rachael Fee, MD;  Location: [facility];  Service: Endoscopy;;    Allergies  Allergen Reactions   Dilaudid [Hydromorphone Hcl] Itching   Ambien [Zolpidem] Other (See Comments)    Pt states "makes me wild"   Niacin And Related Other (See Comments)    Extreme fatigue    Current Outpatient Medications  Medication Sig Dispense Refill   aspirin EC 81 MG tablet Take 1 tablet (81 mg total) by mouth daily. Swallow whole. 90 tablet 3   atorvastatin (LIPITOR) 80 MG tablet Take 1 tablet (80 mg total) by mouth daily. 90 tablet 3   cetirizine (ZYRTEC) 10 MG tablet Take 1 tablet (10 mg total) by mouth daily. 90 tablet 3   clopidogrel (PLAVIX) 75 MG tablet Take 1  tablet by mouth once daily 90 tablet 1   Coenzyme Q10 (COQ10) 200 MG CAPS Take 200 mg by mouth daily.     empagliflozin (JARDIANCE) 10 MG TABS tablet Take 1 tablet (10 mg total) by mouth daily before breakfast. 90 tablet 3   ezetimibe (ZETIA) 10 MG tablet Take 1 tablet (10 mg total) by mouth daily. 90 tablet 3   fluticasone (FLONASE) 50 MCG/ACT nasal spray Place 2 sprays into both nostrils daily. 16 g 1   furosemide (LASIX) 20 MG tablet Take 1 tablet (20 mg total) by mouth daily. 90 tablet 3   gabapentin (NEURONTIN) 100 MG capsule Take 100 mg by mouth 3 (three) times daily.     glimepiride (AMARYL) 2 MG tablet Take 1 tablet (2 mg total) by mouth daily before breakfast. 90 tablet 3  metFORMIN (GLUCOPHAGE) 1000 MG tablet TAKE 1 TABLET BY MOUTH TWICE DAILY WITH MEALS 180 tablet 2   metoprolol succinate (TOPROL XL) 25 MG 24 hr tablet Take 0.5 tablets (12.5 mg total) by mouth daily. 45 tablet 3   Netarsudil-Latanoprost (ROCKLATAN) 0.02-0.005 % SOLN Apply 1 drop to eye at bedtime.     nitroGLYCERIN (NITROSTAT) 0.4 MG SL tablet For chest pain, tightness, or pressure. While sitting, place 1 tablet under tongue. May be used every 5 minutes as needed, for up to 15 minutes. Do not use more than 3 tablets. 25 tablet 3   omega-3 acid ethyl esters (LOVAZA) 1 g capsule Take 2 capsules by mouth twice daily 360 capsule 2   sacubitril-valsartan (ENTRESTO) 49-51 MG Take 1 tablet by mouth 2 (two) times daily. 180 tablet 3   senna-docusate (SENOKOT-S) 8.6-50 MG tablet Take 1 tablet by mouth at bedtime as needed for mild constipation. 15 tablet 0   TURMERIC CURCUMIN PO Take 1 capsule by mouth daily.     Brinzolamide-Brimonidine 1-0.2 % SUSP Apply 1 drop to eye 3 (three) times daily.  (Patient not taking: Reported on 04/13/2023)     latanoprost (XALATAN) 0.005 % ophthalmic solution Place 1 drop into both eyes at bedtime. (Patient not taking: Reported on 04/13/2023)     No current facility-administered medications for  this visit.    Socially he is married has 2 children 2 grandchildren. He does walk several times a week. He does have a tobacco history. There is no alcohol use.   ROS General: Negative; No fevers, chills, or night sweats; positive for obesity. HEENT: Negative; No changes in vision or hearing, sinus congestion, difficulty swallowing Pulmonary: Negative; No cough, wheezing, shortness of breath, hemoptysis Cardiovascular: See HPI GI: Colonic abscess requiring colostomy, and ultimate closure; positive for dyspepsia GU: Negative; No dysuria, hematuria, or difficulty voiding Musculoskeletal: He is status post left knee surgery; no myalgias, joint pain, or weakness Hematologic/Oncology: Negative; no easy bruising, bleeding Endocrine: Negative; no heat/cold intolerance; no diabetes Neuro: Negative; no changes in balance, headaches Skin: Recurrent cysts in the back of his neck Psychiatric: Negative; No behavioral problems, depression Sleep: Positive for sleep apnea, on CPAP with splint compliance. No snoring, daytime sleepiness, hypersomnolence, bruxism, restless legs, hypnogognic hallucinations, no cataplexy Other comprehensive 14 point system review is negative.   PE BP 100/60   Pulse 69   Ht 5\' 8"  (1.727 m)   Wt 236 lb 12.8 oz (107.4 kg)   SpO2 95%   BMI 36.01 kg/m    Repeat blood pressure by me was 120/64  Wt Readings from Last 3 Encounters:  04/13/23 236 lb 12.8 oz (107.4 kg)  02/07/23 244 lb (110.7 kg)  02/05/23 244 lb 8 oz (110.9 kg)   General: Alert, oriented, no distress.  Skin: normal turgor, no rashes, warm and dry HEENT: Normocephalic, atraumatic. Pupils equal round and reactive to light; sclera anicteric; extraocular muscles intact;  Nose without nasal septal hypertrophy Mouth/Parynx benign; Mallinpatti scale 3 Neck: No JVD, no carotid bruits; normal carotid upstroke Lungs: clear to ausculatation and percussion; no wheezing or rales Chest wall: without tenderness to  palpitation Heart: PMI not displaced, RRR, s1 s2 normal, 1/6 systolic murmur, no diastolic murmur, no rubs, gallops, thrills, or heaves Abdomen: soft, nontender; no hepatosplenomehaly, BS+; abdominal aorta nontender and not dilated by palpation. Back: no CVA tenderness Pulses 2+ Musculoskeletal: full range of motion, normal strength, no joint deformities Extremities: no clubbing cyanosis or edema, Homan's sign negative  Neurologic: grossly  nonfocal; Cranial nerves grossly wnl Psychologic: Normal mood and affect      EKG Interpretation  Date/Time:  Friday April 13 2023 15:20:37 EDT Ventricular Rate:  63 PR Interval:  188 QRS Duration: 94 QT Interval:  438 QTC Calculation: 448 R Axis:   13 Text Interpretation: Sinus rhythm with Premature atrial complexes When compared with ECG of 29-Aug-2022 11:44, Premature atrial complexes are now Present Confirmed by Nicki Guadalajara ([id number]) on 04/13/2023 3:58:31 PM    September 07, 2022 ECG (independently read by me): NSR at 73, No STT changes  October 25, 2021 ECG (independently read by me):  Sinus bradycardia at 43; mild T wave abnormality  December 20, 2020 ECG (independently read by me): Sinus bradycardia at 49  August 2021 ECG (independently read by me): Sinus Bradycardia at 43; no ectopy; normal intervals  July 2021 ECG (independently read by me): Sinus bradycardia at 43 bpm.  No significant ST-T changes.  Normal intervals.  No ectopy  November 10, 2019 ECG (independently read by me):Sinus bradycardia at 46 bpm.  Nonspecific T changes.  Normal intervals.  No ectopy  February 2020 ECG (independently read by me): Sinus Bradycardioa 51; Norml intervals  June 2018 ECG (independently read by me): Sinus bradycardia 57 bpm.  No ectopy.  Normal intervals.  October 2018 ECG (independently read by me): Normal sinus rhythm with PAC.  PR interval 168 ms.  QTc interval 454 ms.  May 2017 ECG (independently read by me): Sinus bradycardia 52 bpm.  T-wave  abnormality in leads 3 and aVF.  May 2016 ECG (independently read by me): Sinus bradycardia 53 bpm.  Nondiagnostic T changes with T-wave inversion in lead 3.  June 2015 ECG (independently read by me):  sinus bradycardia at 55 QTc interval 447 ms.  PR interval 174 ms.  Nondiagnostic T changes in lead 3.  ECG: Sinus rhythm at 56 beats per minute. No ectopy. Normal intervals; nonspecific T changes in lead 3.  LABS:    Latest Ref Rng & Units 02/05/2023   10:37 AM 08/29/2022   11:43 AM 08/23/2022   10:36 AM  BMP  Glucose 70 - 99 mg/dL 756  433  295   BUN 6 - 23 mg/dL 23  30  13    Creatinine 0.40 - 1.50 mg/dL 1.88  4.16  6.06   Sodium 135 - 145 mEq/L 136  141  142   Potassium 3.5 - 5.1 mEq/L 4.1  4.3  3.9   Chloride 96 - 112 mEq/L 102  100  105   CO2 19 - 32 mEq/L 24  26  28    Calcium 8.4 - 10.5 mg/dL 9.6  9.9  9.4       Latest Ref Rng & Units 02/05/2023   10:37 AM 08/07/2022    5:23 PM 08/07/2022    5:09 AM  Hepatic Function  Total Protein 6.5 - 8.1 g/dL  5.5  5.1   Albumin 3.5 - 5.2 g/dL 4.4  3.1  2.9   AST 15 - 41 U/L  204  248   ALT 0 - 44 U/L  134  126   Alk Phosphatase 38 - 126 U/L  42  43   Total Bilirubin 0.3 - 1.2 mg/dL  0.8  0.9        Latest Ref Rng & Units 02/05/2023   10:37 AM 08/23/2022   10:36 AM 08/09/2022    5:07 AM  CBC  WBC 4.0 - 10.5 K/uL 5.5  5.9  7.1  Hemoglobin 13.0 - 17.0 g/dL 62.9  52.8  41.3   Hematocrit 39.0 - 52.0 % 39.9  39.1  34.4   Platelets 150.0 - 400.0 K/uL 147.0  228  82    Lab Results  Component Value Date   TSH 0.90 03/12/2020   Lab Results  Component Value Date   HGBA1C 10.1 (A) 02/05/2023   Lipid Panel     Component Value Date/Time   CHOL 91 05/30/2022 0726   CHOL 149 03/13/2016 0803   TRIG 185.0 (H) 05/30/2022 0726   TRIG 387 (H) 03/13/2016 0803   HDL 33.90 (L) 05/30/2022 0726   HDL 26 (L) 03/13/2016 0803   CHOLHDL 3 05/30/2022 0726   VLDL 37.0 05/30/2022 0726   LDLCALC 20 05/30/2022 0726   LDLCALC 46 03/13/2016 0803    LDLDIRECT 61.0 12/27/2018 1245    RADIOLOGY: No results found.  Eugenie Birks Myoview 05/11/2020 Study Highlights    The left ventricular ejection fraction is moderately decreased (30-44%). Nuclear stress EF: 43%. No T wave inversion was noted during stress. There was no ST segment deviation noted during stress. Defect 1: There is a small defect of mild severity present in the mid inferior location. Findings consistent with prior myocardial infarction. This is an intermediate risk study.   Small size, mild severity fixed mid inferior perfusion defect, likely scar. LVEF 43% with basal to mid inferior hypokinesis. This is an intermediate risk study. Compared to a prior study in 2019, the LVEF is calculated lower, but appears visually higher - the perfusion defect appears unchanged.    CATH/PCI: 08/06/2022 CONCLUSIONS: Cardiac arrest in the setting of ACS identified that the mid RCA above a previously placed stent is 99% stenosis with TIMI grade II flow and recruited left-to-right collaterals distally.  The vessel is heavily calcified and the entire proximal segment is segmentally 50% narrowed. The culprit lesion was dilated and then stented with a 28 x 3.5 mm Synergy postdilated using high-pressure with a 3.75 x 22 Simpson balloon to 17 atm x 2.  TIMI grade III flow established.  0% stenosis was noted at the site of the 99% stenosis but persistent 50% underexpansion was noted proximal to the culprit. Left main is widely patent LAD is widely patent Circumflex is widely patent Inferior wall to the apex is severely hypokinetic to akinetic.  EDP is 17.  EF is 30 to 35%.   RECOMMENDATIONS:   Unknown if patient took Plavix this morning.  Cangrelor started in Cath Lab.  Once NG tube is in place cangrelor will be discontinued and 300 mg of clopidogrel given per NG tube. Management of ventilator and postcardiac arrest state by critical care medicine.  Intention is for patient to be on aspirin and  Plavix. Sheath will be pulled according to protocol when ACT/Angiomax are at target.      Intervention        ECHO: 02/27/2023  1. Left ventricular ejection fraction, by estimation, is 60 to 65%. The  left ventricle has normal function. The left ventricle demonstrates  regional wall motion abnormalities (see scoring diagram/findings for  description). There is mild concentric left  ventricular hypertrophy. Left ventricular diastolic parameters are  indeterminate. There is mild hypokinesis of the left ventricular, basal  inferior wall and inferolateral wall.   2. Right ventricular systolic function is normal. The right ventricular  size is normal.   3. The mitral valve is grossly normal. Trivial mitral valve  regurgitation. No evidence of mitral stenosis.   4. The  aortic valve is tricuspid. There is mild calcification of the  aortic valve. There is mild thickening of the aortic valve. Aortic valve  regurgitation is not visualized. Aortic valve sclerosis is present, with  no evidence of aortic valve stenosis.   Comparison(s): Changes from prior study are noted. EF improved compared to  prior.   IMPRESSION:  1. Ischemic cardiomyopathy: improved   2. Hyperlipidemia with target LDL less than 70   3. OSA on CPAP   4. Type 2 diabetes mellitus with complication, without long-term current use of insulin (HCC)   5. Coronary artery disease involving native coronary artery of native heart with angina pectoris (HCC)   6. Obesity, Class II, BMI 35-39.9      ASSESSMENT AND PLAN: Evan Avery  is a 77 year old Caucasian male who has CAD and underwent initial PTCA to his RCA in 1992.  At  catheterization in October 2009 a fourth stent was placed in his RCA.   He continues to be on dual antiplatelet therapy with aspirin and Plavix with his multiple stents.  A preoperative Lexiscan Myoview study in July 2019 prior to his back surgery was low risk and showed a small inferior defect with EF 50%.   When I saw him in July 2021 he had developed nonexertional episodes of chest pressure which were short-lived and often improved with antacid therapy. Symptoms would typically last a minute or 2.  He was started on Pepcid with improvement.  At the time he was drinking over 6 cups of coffee per day which undoubtedly contributed to his dyspeptic symptomatology.  His Lexiscan Myoview study was unchanged from 2019 and showed mild severity fixed mid inferior perfusion defect most likely scar.  Nuclear EF was 43% but visually it appeared higher.  When I saw him in January 2023, he was experiencing some reflux symptomatology but at times  experienced some vague left arm sensation.  Due to sinus bradycardia with heart rate at 43 his atenolol dose was reduced from 25 twice a day to 25 mg in the morning and 12.5 mg at night.  Isosorbide 30 mg was added to his medical regimen and he continued to be on DAPT with aspirin/Plavix, and for hyperlipidemia atorvastatin 40 mg, Lovaza 2 capsules twice a day, and Zetia.  He was diabetic on glimepiride, metformin in addition to pioglitazone.  He was using CPAP with 100% compliance.  He suffered a witnessed cardiac arrest at home on August 06, 2022 and CPR was started by his wife.  When EMS arrived, he required 6 shocks for VF before ROSC.  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   Although acute EF was 30 to 35%, One day later EF had improved to 45 to 50%.  He is on guideline directed medical therapy with Jardiance 10 mg, Entresto 49/51 mg twice a day, furosemide 20 mg daily in addition to continue DAPT with aspirin/Plavix.  When I saw him in November 2023 I added metoprolol succinate 12.5 mg with his resting pulse at 73.  He continues to be on atorvastatin 80 mg, Zetia 10 mg, and Lovaza 2 capsules twice a day for mixed hyperlipidemia.  I reviewed his most recent echo Doppler study from Feb 27, 2023 which now shows normal global EF at 60 to 65%.  He has mild LVH.  There is mild residual hypokinesis of the basal inferior to inferolateral wall.  There is mild aortic sclerosis without  stenosis.  He continues to be on CPAP which now is followed at the [place].  However he has not had any recent evaluation.  He is compliant with 100% use and average use at approximately 9 hours per night.  His machine is also been linked to our office and a download today confirms suboptimal result with AHI 18.2 with apnea index of 17.6, hypopnea index 0.6, and central apnea index of 7.4.  He has been on a pressure range of 7 to 14 cm with 95th percentile pressure 13.1 with maximum average pressure 13.8.  He does have some mask leak.  I recommended he follow-up with the [facility].  For his CPAP.  However, with his high level I will make slight adjustment today and will increase his minimum pressure to 9 and change his pressure range to 9-16.  If he continues to have central events he may need a new evaluation with BiPAP or possible ASV.   Lennette Bihari, MD, [facility]  04/14/2023 3:29 PM

## 2023-04-14 ENCOUNTER — Encounter: Payer: Self-pay | Admitting: Cardiovascular Disease

## 2023-04-23 ENCOUNTER — Other Ambulatory Visit: Payer: Self-pay | Admitting: Family Medicine

## 2023-04-23 DIAGNOSIS — E1121 Type 2 diabetes mellitus with diabetic nephropathy: Secondary | ICD-10-CM

## 2023-04-24 NOTE — Telephone Encounter (Signed)
Plavix Last filled:  07/04/22, #90 Last OV:  02/05/23, 4 mo DM f/u Next OV:  06/13/23, CPE

## 2023-04-25 NOTE — Telephone Encounter (Signed)
ERx 

## 2023-05-14 ENCOUNTER — Other Ambulatory Visit: Payer: Self-pay | Admitting: Family Medicine

## 2023-05-14 DIAGNOSIS — E1169 Type 2 diabetes mellitus with other specified complication: Secondary | ICD-10-CM

## 2023-06-13 ENCOUNTER — Ambulatory Visit (INDEPENDENT_AMBULATORY_CARE_PROVIDER_SITE_OTHER): Payer: PPO | Admitting: Family Medicine

## 2023-06-13 ENCOUNTER — Encounter: Payer: Self-pay | Admitting: Family Medicine

## 2023-06-13 VITALS — BP 112/62 | HR 66 | Temp 97.5°F | Ht 68.0 in | Wt 236.8 lb

## 2023-06-13 DIAGNOSIS — J432 Centrilobular emphysema: Secondary | ICD-10-CM

## 2023-06-13 DIAGNOSIS — Z0000 Encounter for general adult medical examination without abnormal findings: Secondary | ICD-10-CM

## 2023-06-13 DIAGNOSIS — E785 Hyperlipidemia, unspecified: Secondary | ICD-10-CM | POA: Diagnosis not present

## 2023-06-13 DIAGNOSIS — I10 Essential (primary) hypertension: Secondary | ICD-10-CM

## 2023-06-13 DIAGNOSIS — F17200 Nicotine dependence, unspecified, uncomplicated: Secondary | ICD-10-CM | POA: Diagnosis not present

## 2023-06-13 DIAGNOSIS — K219 Gastro-esophageal reflux disease without esophagitis: Secondary | ICD-10-CM | POA: Diagnosis not present

## 2023-06-13 DIAGNOSIS — K439 Ventral hernia without obstruction or gangrene: Secondary | ICD-10-CM

## 2023-06-13 DIAGNOSIS — E1121 Type 2 diabetes mellitus with diabetic nephropathy: Secondary | ICD-10-CM

## 2023-06-13 DIAGNOSIS — I255 Ischemic cardiomyopathy: Secondary | ICD-10-CM | POA: Diagnosis not present

## 2023-06-13 DIAGNOSIS — I25110 Atherosclerotic heart disease of native coronary artery with unstable angina pectoris: Secondary | ICD-10-CM | POA: Diagnosis not present

## 2023-06-13 DIAGNOSIS — H401132 Primary open-angle glaucoma, bilateral, moderate stage: Secondary | ICD-10-CM

## 2023-06-13 DIAGNOSIS — D696 Thrombocytopenia, unspecified: Secondary | ICD-10-CM | POA: Diagnosis not present

## 2023-06-13 DIAGNOSIS — E1169 Type 2 diabetes mellitus with other specified complication: Secondary | ICD-10-CM

## 2023-06-13 DIAGNOSIS — Z7189 Other specified counseling: Secondary | ICD-10-CM

## 2023-06-13 DIAGNOSIS — G4733 Obstructive sleep apnea (adult) (pediatric): Secondary | ICD-10-CM

## 2023-06-13 DIAGNOSIS — Z77098 Contact with and (suspected) exposure to other hazardous, chiefly nonmedicinal, chemicals: Secondary | ICD-10-CM

## 2023-06-13 LAB — LIPID PANEL
Cholesterol: 118 mg/dL (ref 0–200)
HDL: 24.2 mg/dL — ABNORMAL LOW (ref >39.00)
Total CHOL/HDL Ratio: 5
Triglycerides: 436 mg/dL — ABNORMAL HIGH (ref 0.0–149.0)

## 2023-06-13 LAB — COMPREHENSIVE METABOLIC PANEL
ALT: 13 U/L (ref 0–53)
AST: 13 U/L (ref 0–37)
Albumin: 4.4 g/dL (ref 3.5–5.2)
Alkaline Phosphatase: 66 U/L (ref 39–117)
BUN: 23 mg/dL (ref 6–23)
CO2: 26 mEq/L (ref 19–32)
Calcium: 9.6 mg/dL (ref 8.4–10.5)
Chloride: 103 mEq/L (ref 96–112)
Creatinine, Ser: 1.43 mg/dL (ref 0.40–1.50)
GFR: 47.15 mL/min — ABNORMAL LOW (ref >60.00)
Glucose, Bld: 186 mg/dL — ABNORMAL HIGH (ref 70–99)
Potassium: 4.2 mEq/L (ref 3.5–5.1)
Sodium: 138 mEq/L (ref 135–145)
Total Bilirubin: 0.6 mg/dL (ref 0.2–1.2)
Total Protein: 6.8 g/dL (ref 6.0–8.3)

## 2023-06-13 LAB — MICROALBUMIN / CREATININE URINE RATIO
Creatinine,U: 29.7 mg/dL
Microalb Creat Ratio: 12 mg/g (ref 0.0–30.0)
Microalb, Ur: 3.6 mg/dL — ABNORMAL HIGH (ref 0.0–1.9)

## 2023-06-13 LAB — LDL CHOLESTEROL, DIRECT: Direct LDL: 28 mg/dL

## 2023-06-13 LAB — CBC WITH DIFFERENTIAL/PLATELET
Basophils Absolute: 0.1 10*3/uL (ref 0.0–0.1)
Basophils Relative: 1.2 % (ref 0.0–3.0)
Eosinophils Absolute: 0.3 10*3/uL (ref 0.0–0.7)
Eosinophils Relative: 5.3 % — ABNORMAL HIGH (ref 0.0–5.0)
HCT: 40.7 % (ref 39.0–52.0)
Hemoglobin: 13.8 g/dL (ref 13.0–17.0)
Lymphocytes Relative: 19.1 % (ref 12.0–46.0)
Lymphs Abs: 1.3 10*3/uL (ref 0.7–4.0)
MCHC: 33.9 g/dL (ref 30.0–36.0)
MCV: 94.6 fl (ref 78.0–100.0)
Monocytes Absolute: 0.4 10*3/uL (ref 0.1–1.0)
Monocytes Relative: 6.7 % (ref 3.0–12.0)
Neutro Abs: 4.4 10*3/uL (ref 1.4–7.7)
Neutrophils Relative %: 67.7 % (ref 43.0–77.0)
Platelets: 148 10*3/uL — ABNORMAL LOW (ref 150.0–400.0)
RBC: 4.3 Mil/uL (ref 4.22–5.81)
RDW: 14.4 % (ref 11.5–15.5)
WBC: 6.6 10*3/uL (ref 4.0–10.5)

## 2023-06-13 LAB — HEMOGLOBIN A1C: Hgb A1c MFr Bld: 9.1 % — ABNORMAL HIGH (ref 4.6–6.5)

## 2023-06-13 MED ORDER — OZEMPIC (0.25 OR 0.5 MG/DOSE) 2 MG/3ML ~~LOC~~ SOPN: 0.5000 mg | PEN_INJECTOR | SUBCUTANEOUS | 11 refills | Status: DC

## 2023-06-13 NOTE — Assessment & Plan Note (Addendum)
Congratulated on weight loss noted - continue ozempic.

## 2023-06-13 NOTE — Progress Notes (Signed)
Ph: [phone] Fax: [phone]   Patient ID: Evan Avery, male    DOB: April 11, 1946, 77 y.o.   MRN: [id number]  This visit was conducted in person.  BP 112/62   Pulse 66   Temp (!) 97.5 F (36.4 C) (Temporal)   Ht 5\' 8"  (1.727 m)   Wt 236 lb 12.8 oz (107.4 kg)   SpO2 95%   BMI 36.01 kg/m    CC: CPE Subjective:   HPI: Evan Avery is a 77 y.o. male presenting on 06/13/2023 for Annual Exam   Saw health advisor 01/2023 for medicare wellness visit. Note reviewed.   No results found.  Flowsheet Row Office Visit from 06/13/2023 in [facility] [place]  PHQ-2 Total Score 0          06/13/2023    9:47 AM 02/07/2023    2:40 PM 02/05/2023   10:47 AM 01/10/2021    8:02 AM 01/02/2020    9:46 AM  Fall Risk   Falls in the past year? 0 1 1 0 0  Number falls in past yr: 0 0 0  0  Comment  pt had heart attack in 10/23 fell back on floor     Injury with Fall? 0 1 1  0  Comment  pt had heart attack in Oct 2023     Risk for fall due to : No Fall Risks    Medication side effect  Follow up Falls evaluation completed   Falls evaluation completed Falls evaluation completed;Falls prevention discussed    Regularly sees cardiology s/p out of hospital arrest 07/2022 with subsequent ICU stay.   POAG, severe OD, mod OS - managing with [facility] drops[facility]. Discussing [facility] surgery for glaucoma and cataracts in [place] [facility])   Saw [place] PCP 02/2022 - gabapentin dose dropped to 100-200mg  nightly (neuropathy).  He receives [place] through the [place].   DM - on jardiance 10mg  daily, metformin 1000mg  bid, glimepiride 2mg  daily. Last visit started ozempic - currently on 0.5mg  weekly dose. Tolerating well without nausea, constipation or abd pain. Notes frequent urination without dysuria, fever.  Lab Results  Component Value Date   HGBA1C 10.1 (A) 02/05/2023    HLD - continues atorvastatin 80mg  daily with zetia  10mg  daily as well as Lovaza.  Continued neuropathy managed with gabapentin 100mg  TID.  Preventative: COLONOSCOPY 08/2020 - multiple polpys removed (TA, HP), 1.5cm polyp biopsied (SSP), colon lipoma Christella Hartigan)  COLONOSCOPY 10/2020 - 1.5cm SSP, HP, rpt 2 yrs Christella Hartigan) Prostate cancer screening - aged out  Lung cancer screening - undergoing first done 03/2019. Has decided to stop.  Flu shot yearly  COVID vaccine Pfizer 11/2019, 12/2019, no booster Tdap 2014 , Td 09/2021 Pneumovax 2014, prevnar-13 2015  RSV 02/2023 Shingrix - 09/2021, 02/2023 Advanced directive discussion - does not want prolonged life support. Would want wife to be HCPOA. Packet previously provided, again today.  Seat belt use discussed  Sunscreen use discussed, no changing moles. Previously saw dermatologist. Ex smoker - quit 04/2016 - restarted 1 ppd. 50+ PY. Has nicotine patches at home.  Alcohol - rare Dentist q6 mo - upper dentures Eye exam Q3-4 mo through [place] - glaucoma  Bowel - no constipation Bladder - no incontinence   Lives with wife and son Occupation: retired, was Tax inspector for [employer] Activity: no regular exercise due to foot pain Diet: good water, fruits/vegetables daily  Relevant past medical, surgical, family and social history reviewed and updated as indicated. Interim medical history since our last visit reviewed. Allergies and medications reviewed and updated. Outpatient Medications Prior to Visit  Medication Sig Dispense Refill   aspirin EC 81 MG tablet Take 1 tablet (81 mg total) by mouth daily. Swallow whole. 90 tablet 3   atorvastatin (LIPITOR) 80 MG tablet Take 1 tablet (80 mg total) by mouth daily. 90 tablet 3   cetirizine (ZYRTEC) 10 MG tablet Take 1 tablet (10 mg total) by mouth daily. 90 tablet 3   clopidogrel (PLAVIX) 75 MG tablet Take 1 tablet by mouth once daily 90 tablet 0   Coenzyme Q10 (COQ10) 200 MG CAPS Take 200 mg by mouth daily.     empagliflozin (JARDIANCE) 10 MG TABS  tablet Take 1 tablet (10 mg total) by mouth daily before breakfast. 90 tablet 3   ezetimibe (ZETIA) 10 MG tablet Take 1 tablet (10 mg total) by mouth daily. 90 tablet 3   fluticasone (FLONASE) 50 MCG/ACT nasal spray Place 2 sprays into both nostrils daily. 16 g 1   furosemide (LASIX) 20 MG tablet Take 1 tablet (20 mg total) by mouth daily. 90 tablet 3   gabapentin (NEURONTIN) 100 MG capsule Take 100 mg by mouth 3 (three) times daily.     glimepiride (AMARYL) 2 MG tablet TAKE 1 TABLET BY MOUTH ONCE DAILY BEFORE BREAKFAST 90 tablet 0   metFORMIN (GLUCOPHAGE) 1000 MG tablet TAKE 1 TABLET BY MOUTH TWICE DAILY WITH MEALS 180 tablet 2   metoprolol succinate (TOPROL XL) 25 MG 24 hr tablet Take 0.5 tablets (12.5 mg total) by mouth daily. 45 tablet 3   Netarsudil-Latanoprost (ROCKLATAN) 0.02-0.005 % SOLN Apply 1 drop to eye at bedtime.     nitroGLYCERIN (NITROSTAT) 0.4 MG SL tablet For chest pain, tightness, or pressure. While sitting, place 1 tablet under tongue. May be used every 5 minutes as needed, for up to 15 minutes. Do not use more than 3 tablets. 25 tablet 3   omega-3 acid ethyl esters (LOVAZA) 1 g capsule Take 2 capsules by mouth twice daily 360 capsule 0   sacubitril-valsartan (ENTRESTO) 49-51 MG Take 1 tablet by mouth 2 (two) times daily. 180 tablet 3   senna-docusate (SENOKOT-S) 8.6-50 MG tablet Take 1 tablet by mouth at bedtime as needed for mild constipation. 15 tablet 0   TURMERIC CURCUMIN PO Take 1 capsule by mouth daily.     Brinzolamide-Brimonidine 1-0.2 % SUSP Apply 1 drop to eye 3 (three) times daily.  (Patient not taking: Reported on 06/13/2023)     latanoprost (XALATAN) 0.005 % ophthalmic solution Place 1 drop into both eyes at bedtime. (Patient not taking: Reported on 06/13/2023)     No facility-administered medications prior to visit.     Per HPI unless specifically indicated in ROS section below Review of Systems  Constitutional:  Negative for activity change, appetite change,  chills, fatigue, fever and unexpected weight change.  HENT:  Negative for hearing loss.   Eyes:  Negative for visual disturbance.  Respiratory:  Negative for cough, chest tightness, shortness of breath and wheezing.   Cardiovascular:  Negative for chest pain, palpitations and leg swelling.  Gastrointestinal:  Negative for abdominal distention, abdominal pain, blood in stool, constipation, diarrhea, nausea and vomiting.  Genitourinary:  Negative for difficulty urinating and hematuria.  Musculoskeletal:  Negative for arthralgias, myalgias and neck pain.  Skin:  Negative for rash.  Neurological:  Negative for dizziness, seizures, syncope and  headaches.  Hematological:  Negative for adenopathy. Bruises/bleeds easily.  Psychiatric/Behavioral:  Negative for dysphoric mood. The patient is not nervous/anxious.     Objective:  BP 112/62   Pulse 66   Temp (!) 97.5 F (36.4 C) (Temporal)   Ht 5\' 8"  (1.727 m)   Wt 236 lb 12.8 oz (107.4 kg)   SpO2 95%   BMI 36.01 kg/m   Wt Readings from Last 3 Encounters:  06/13/23 236 lb 12.8 oz (107.4 kg)  04/13/23 236 lb 12.8 oz (107.4 kg)  02/07/23 244 lb (110.7 kg)      Physical Exam Vitals and nursing note reviewed.  Constitutional:      General: He is not in acute distress.    Appearance: Normal appearance. He is well-developed. He is not ill-appearing.  HENT:     Head: Normocephalic and atraumatic.     Right Ear: Hearing, tympanic membrane, ear canal and external ear normal.     Left Ear: Hearing, tympanic membrane, ear canal and external ear normal.     Nose: Nose normal.     Mouth/Throat:     Mouth: Mucous membranes are moist.     Pharynx: Oropharynx is clear. No oropharyngeal exudate or posterior oropharyngeal erythema.  Eyes:     General: No scleral icterus.    Extraocular Movements: Extraocular movements intact.     Conjunctiva/sclera: Conjunctivae normal.     Pupils: Pupils are equal, round, and reactive to light.  Neck:     Thyroid:  No thyroid mass or thyromegaly.     Vascular: No carotid bruit.  Cardiovascular:     Rate and Rhythm: Normal rate and regular rhythm.     Pulses: Normal pulses.          Radial pulses are 2+ on the right side and 2+ on the left side.     Heart sounds: Normal heart sounds. No murmur heard. Pulmonary:     Effort: Pulmonary effort is normal. No respiratory distress.     Breath sounds: Normal breath sounds. No wheezing, rhonchi or rales.  Abdominal:     General: Bowel sounds are normal. There is no distension.     Palpations: Abdomen is soft. There is no mass.     Tenderness: There is no abdominal tenderness. There is no guarding or rebound.     Hernia: A hernia is present. Hernia is present in the umbilical area.       Comments: Both umbilical and incisional hernia - with auscultated bowel  Musculoskeletal:        General: Normal range of motion.     Cervical back: Normal range of motion and neck supple.     Right lower leg: No edema.     Left lower leg: No edema.  Lymphadenopathy:     Cervical: No cervical adenopathy.  Skin:    General: Skin is warm and dry.     Findings: No rash.  Neurological:     General: No focal deficit present.     Mental Status: He is alert and oriented to person, place, and time.  Psychiatric:        Mood and Affect: Mood normal.        Behavior: Behavior normal.        Thought Content: Thought content normal.        Judgment: Judgment normal.        Lab Results  Component Value Date   NA 136 02/05/2023   CL 102 02/05/2023  K 4.1 02/05/2023   CO2 24 02/05/2023   BUN 23 02/05/2023   CREATININE 1.38 02/05/2023   GFR 49.33 (L) 02/05/2023   CALCIUM 9.6 02/05/2023   PHOS 3.6 02/05/2023   ALBUMIN 4.4 02/05/2023   GLUCOSE 238 (H) 02/05/2023   Assessment & Plan:   Problem List Items Addressed This Visit     Type 2 diabetes mellitus with CAD (Chronic)    Chronic, tolerating medications well including ozempic 0.5mg  weekly - update A1c today and  if remaining above goal will further titrate GLP1RA.       Relevant Medications   Semaglutide,0.25 or 0.5MG /DOS, (OZEMPIC, 0.25 OR 0.5 MG/DOSE,) 2 MG/3ML SOPN   Other Relevant Orders   Hemoglobin A1c   Microalbumin / creatinine urine ratio   Vitamin B12   OSA on CPAP (Chronic)    [facility].       Hyperlipidemia associated with type 2 diabetes mellitus (HCC), target LDL <70 (Chronic)    Chronic, stable. Continue current regimen. LDL goal <70, ideally <50 The ASCVD Risk score (Arnett DK, et al., 2019) failed to calculate for the following reasons:   The patient has a prior MI or stroke diagnosis       Relevant Medications   Semaglutide,0.25 or 0.5MG /DOS, (OZEMPIC, 0.25 OR 0.5 MG/DOSE,) 2 MG/3ML SOPN   Other Relevant Orders   Lipid panel   Comprehensive metabolic panel   TSH   Essential hypertension (Chronic)    Chronic, stable period on current regimen - continue      Relevant Orders   CBC with Differential/Platelet   Severe obesity (BMI 35.0-39.9) with comorbidity (HCC) (Chronic)    Congratulated on weight loss noted - continue ozempic.       Relevant Medications   Semaglutide,0.25 or 0.5MG /DOS, (OZEMPIC, 0.25 OR 0.5 MG/DOSE,) 2 MG/3ML SOPN   Coronary artery disease involving native coronary artery of native heart with unstable angina pectoris (HCC) (Chronic)   Health maintenance examination - Primary (Chronic)    Preventative protocols reviewed and updated unless pt declined. Discussed healthy diet and lifestyle.       Advanced care planning/counseling discussion (Chronic)    Advanced directive discussion - does not want prolonged life support. Would want wife to be HCPOA. Packet previously provided, again today.       Smoker (Chronic)    Continues smoking 1 ppd. Encouraged full cessation.       COPD (chronic obstructive pulmonary disease) (HCC) (Chronic)    Mild by imaging, pt asxs.       Thrombocytopenia (HCC)    Update CBC.        Relevant Orders   CBC with Differential/Platelet   Primary open angle glaucoma (POAG) of both eyes, moderate stage    [facility]       Agent orange exposure   GERD (gastroesophageal reflux disease)    Stable off PPI.       Type 2 diabetes mellitus with diabetic nephropathy, without long-term current use of insulin (HCC)   Relevant Medications   Semaglutide,0.25 or 0.5MG /DOS, (OZEMPIC, 0.25 OR 0.5 MG/DOSE,) 2 MG/3ML SOPN   Abdominal wall hernia    Umbilical hernia as well as R incisional hernia with bowel.  Pt denies pain. Aware to seek urgent care if pain develops.       Ischemic cardiomyopathy     Meds ordered this encounter  Medications   Semaglutide,0.25 or 0.5MG /DOS, (OZEMPIC, 0.25 OR 0.5 MG/DOSE,)  2 MG/3ML SOPN    Sig: Inject 0.5 mg into the skin once a week.    Dispense:  3 mL    Refill:  11    Orders Placed This Encounter  Procedures   Hemoglobin A1c   Microalbumin / creatinine urine ratio   Lipid panel   Comprehensive metabolic panel   CBC with Differential/Platelet   Vitamin B12   TSH    Patient Instructions  You are due for colonoscopy - You may call [facility] (Dr Christella Hartigan) to see when you next need colonoscopy [phone].   Labs today  Continue current diabetes medicines including metformin 100mg  twice daily, jardiance 10mg  daily, and glimepiride 2mg  daily with breakfast. Continue ozempic 0.5mg  weekly, if A1c remaining high, we may increase dose.  Work on quitting smoking.  Work on advanced directive - packet provided.   Follow up plan: Return in about 6 months (around 12/14/2023) for follow up visit.  Eustaquio Boyden, MD

## 2023-06-13 NOTE — Assessment & Plan Note (Signed)
Mild by imaging, pt asxs.

## 2023-06-13 NOTE — Assessment & Plan Note (Signed)
Advanced directive discussion - does not want prolonged life support. Would want wife to be HCPOA. Packet previously provided, again today.

## 2023-06-13 NOTE — Assessment & Plan Note (Signed)
Preventative protocols reviewed and updated unless pt declined. Discussed healthy diet and lifestyle.  

## 2023-06-13 NOTE — Assessment & Plan Note (Signed)
[facility], continue [facility] use.

## 2023-06-13 NOTE — Assessment & Plan Note (Signed)
Update CBC. 

## 2023-06-13 NOTE — Assessment & Plan Note (Signed)
Umbilical hernia as well as R incisional hernia with bowel.  Pt denies pain. Aware to seek urgent care if pain develops.

## 2023-06-13 NOTE — Assessment & Plan Note (Signed)
[facility].

## 2023-06-13 NOTE — Assessment & Plan Note (Signed)
Stable off PPI 

## 2023-06-13 NOTE — Assessment & Plan Note (Signed)
Chronic, stable. Continue current regimen. LDL goal <70, ideally <50 The ASCVD Risk score (Arnett DK, et al., 2019) failed to calculate for the following reasons:   The patient has a prior MI or stroke diagnosis

## 2023-06-13 NOTE — Assessment & Plan Note (Signed)
Continues smoking 1 ppd. Encouraged full cessation.

## 2023-06-13 NOTE — Assessment & Plan Note (Signed)
Chronic, stable period on current regimen - continue.

## 2023-06-13 NOTE — Assessment & Plan Note (Signed)
Chronic, tolerating medications well including ozempic 0.5mg  weekly - update A1c today and if remaining above goal will further titrate GLP1RA.

## 2023-06-13 NOTE — Patient Instructions (Addendum)
You are due for colonoscopy - You may call [facility] (Dr Christella Hartigan) to see when you next need colonoscopy [phone].   Labs today  Continue current diabetes medicines including metformin 100mg  twice daily, jardiance 10mg  daily, and glimepiride 2mg  daily with breakfast. Continue ozempic 0.5mg  weekly, if A1c remaining high, we may increase dose.  Work on quitting smoking.  Work on advanced directive - packet provided.

## 2023-06-15 LAB — TSH: TSH: 0.9 u[IU]/mL (ref 0.35–5.50)

## 2023-06-15 LAB — VITAMIN B12: Vitamin B-12: 185 pg/mL — ABNORMAL LOW (ref 211–911)

## 2023-06-19 ENCOUNTER — Telehealth: Payer: Self-pay | Admitting: *Deleted

## 2023-06-19 NOTE — Telephone Encounter (Signed)
   Pre-operative Risk Assessment    Patient Name: Evan Avery  DOB: October 19, 1946 MRN: [id number]    DATE OF LAST VISIT: 04/13/23 DR. KELLY DATE OF NEXT VISIT: 10/03/23 DR. KELLY  Request for Surgical Clearance    Procedure:   COMPLEX CATARACT W/ISTENT/HYDRUS-LEFT EYE  Date of Surgery:  Clearance 07/09/23                                 Surgeon:  DR. Nada Libman Surgeon's Group or Practice Name:  [facility]  Phone number:  [phone] Fax number:  [phone]   Type of Clearance Requested:   - Medical ; PER CLEARANCE FOR PT DOES NOT NEED TO HOLD ASA OR PLAVIX   Type of Anesthesia:   IV SEDATION   Additional requests/questions:    Elpidio Anis   06/19/2023, 3:07 PM

## 2023-06-20 NOTE — Telephone Encounter (Signed)
   Name: Evan Avery  DOB: 09/12/1946  MRN: [id number]  Primary Cardiologist: Nicki Guadalajara, MD   Preoperative team, please contact this patient and set up a phone call appointment for further preoperative risk assessment. Please obtain consent and complete medication review. Thank you for your help.  I confirm that guidance regarding antiplatelet and oral anticoagulation therapy has been completed and, if necessary, noted below.  None requested.    Carlos Levering, NP 06/20/2023, 11:28 AM [facility]

## 2023-06-20 NOTE — Telephone Encounter (Signed)
Left message to call back to set up tele pre op appt.  

## 2023-06-21 ENCOUNTER — Telehealth: Payer: Self-pay

## 2023-06-21 NOTE — Telephone Encounter (Signed)
Evan Avery, pts wife is on DPR, scheduled for pt on 9/10 at 1:40pm for tele. Med rec and consent done    Patient Consent for Virtual Visit        Evan Avery has provided verbal consent on 06/21/2023 for a virtual visit (video or telephone).   CONSENT FOR VIRTUAL VISIT FOR:  Evan Avery  By participating in this virtual visit I agree to the following:  I hereby voluntarily request, consent and authorize [facility] and its employed or contracted physicians, physician assistants, nurse practitioners or other licensed health care professionals (the Practitioner), to provide me with telemedicine health care services (the "Services") as deemed necessary by the treating Practitioner. I acknowledge and consent to receive the Services by the Practitioner via telemedicine. I understand that the telemedicine visit will involve communicating with the Practitioner through live audiovisual communication technology and the disclosure of certain medical information by electronic transmission. I acknowledge that I have been given the opportunity to request an in-person assessment or other available alternative prior to the telemedicine visit and am voluntarily participating in the telemedicine visit.  I understand that I have the right to withhold or withdraw my consent to the use of telemedicine in the course of my care at any time, without affecting my right to future care or treatment, and that the Practitioner or I may terminate the telemedicine visit at any time. I understand that I have the right to inspect all information obtained and/or recorded in the course of the telemedicine visit and may receive copies of available information for a reasonable fee.  I understand that some of the potential risks of receiving the Services via telemedicine include:  Delay or interruption in medical evaluation due to technological equipment failure or disruption; Information transmitted may not be  sufficient (e.g. poor resolution of images) to allow for appropriate medical decision making by the Practitioner; and/or  In rare instances, security protocols could fail, causing a breach of personal health information.  Furthermore, I acknowledge that it is my responsibility to provide information about my medical history, conditions and care that is complete and accurate to the best of my ability. I acknowledge that Practitioner's advice, recommendations, and/or decision may be based on factors not within their control, such as incomplete or inaccurate data provided by me or distortions of diagnostic images or specimens that may result from electronic transmissions. I understand that the practice of medicine is not an exact science and that Practitioner makes no warranties or guarantees regarding treatment outcomes. I acknowledge that a copy of this consent can be made available to me via my patient portal [facility] MyChart), or I can request a printed copy by calling the office of [facility].    I understand that my insurance will be billed for this visit.   I have read or had this consent read to me. I understand the contents of this consent, which adequately explains the benefits and risks of the Services being provided via telemedicine.  I have been provided ample opportunity to ask questions regarding this consent and the Services and have had my questions answered to my satisfaction. I give my informed consent for the services to be provided through the use of telemedicine in my medical care

## 2023-06-21 NOTE — Telephone Encounter (Signed)
Evan Avery, pts wife is on DPR, scheduled for pt on 9/10 at 1:40pm for tele. Med rec and consent done

## 2023-06-22 ENCOUNTER — Encounter: Payer: Self-pay | Admitting: Family Medicine

## 2023-06-22 ENCOUNTER — Other Ambulatory Visit: Payer: Self-pay | Admitting: Family Medicine

## 2023-06-22 DIAGNOSIS — E538 Deficiency of other specified B group vitamins: Secondary | ICD-10-CM | POA: Insufficient documentation

## 2023-06-22 MED ORDER — VITAMIN B-12 1000 MCG PO TABS: 1000.0000 ug | ORAL_TABLET | Freq: Every day | ORAL | Status: DC

## 2023-06-26 MED ORDER — SEMAGLUTIDE (1 MG/DOSE) 4 MG/3ML ~~LOC~~ SOPN: 1.0000 mg | PEN_INJECTOR | SUBCUTANEOUS | 7 refills | Status: DC

## 2023-06-26 NOTE — Progress Notes (Addendum)
Ozempic dose increased to 1mg  weekly, sent to [facility] pharmacy.

## 2023-06-26 NOTE — Addendum Note (Signed)
Addended by: Eustaquio Boyden on: 06/26/2023 08:30 AM   Modules accepted: Orders

## 2023-06-28 NOTE — Telephone Encounter (Signed)
Pt would like to move time up to around 10am 9/10. Please advise if this is possible

## 2023-06-28 NOTE — Telephone Encounter (Signed)
I called the pt back and informed him that I did not have an appt on 07/03/23 @ 10 am. I offered the pt a 9:40 on 9/12 and 2 pm on 9/16, but pt could not do these times. Pt asked if I get a cancellation to call him.

## 2023-07-02 NOTE — Progress Notes (Unsigned)
Virtual Visit via Telephone Note   Because of Evan Avery's co-morbid illnesses, he is at least at moderate risk for complications without adequate follow up.  This format is felt to be most appropriate for this patient at this time.  The patient did not have access to video technology/had technical difficulties with video requiring transitioning to audio format only (telephone).  All issues noted in this document were discussed and addressed.  No physical exam could be performed with this format.  Please refer to the patient's chart for his consent to telehealth for [facility].  Evaluation Performed:  Preoperative cardiovascular risk assessment _____________   Date:  07/02/2023   Patient ID:  Evan Avery, DOB April 13, 1946, MRN [id number] Patient Location:  Home Provider location:   Office  Primary Care Provider:  Eustaquio Boyden, MD Primary Cardiologist:  Nicki Guadalajara, MD  Chief Complaint / Patient Profile   77 y.o. y/o male with a h/o CAD, stent to the RCA 08/06/2022,ischemic CM, HL, OSA on CPAP, Type II diabetes, and obesity   He is pending complex cataract with istent/Hydrus-left eye, per Dr. Nada Libman, on 07/09/2023 and presents today for telephonic preoperative cardiovascular risk assessment.  History of Present Illness    Evan Avery is a 77 y.o. male who presents via audio/video conferencing for a telehealth visit today.  Pt was last seen in cardiology clinic on 04/13/2023 by Dr. Tresa Endo.  At that time Evan Avery was doing well .  The patient is now pending procedure as outlined above. Since his last visit, he ***  Past Medical History    Past Medical History:  Diagnosis Date   Agent orange exposure 1968   Arthritis    CAD (coronary artery disease)    4 stents in right artery   Complex tear of medial meniscus of left knee as current injury 07/26/2012   [place]   Diabetes mellitus    type 2   Diverticulitis of colon with perforation 05/23/2016   DVT  (deep venous thrombosis) (HCC)    GERD (gastroesophageal reflux disease)    Glaucoma 2006   [facility] drops[facility]   Hx of colonic polyps    Hyperlipidemia    Hypertension    Intra-abdominal abscess (HCC) 05/16/2016   LUMBAR RADICULOPATHY 07/18/2007   Qualifier: Diagnosis of  By: Lovell Sheehan MD, John E    Myocardial infarction [facility])    1992   Sleep apnea    uses CPAP regularly   Past Surgical History:  Procedure Laterality Date   APPENDECTOMY  1958   BOWEL RESECTION N/A 05/23/2016   Procedure: SMALL BOWEL RESECTION;  Surgeon: Avel Peace, MD;  Location: [facility];  Service: General;  Laterality: N/A;   CARDIAC CATHETERIZATION  08/11/2008   angiographically patent LAD diagonal, circumflex and multiple circumflex obtuse marginal branches from the L coronary artery; stenting of mid R coronary artery with 3.5 x 28mm Liberte non DES postdilated w/ 4.0 DuraStent Tresa Endo)   CARDIOVASCULAR STRESS TEST  06/16/2011   R/P MV - moderate perfusion defect in basal inferoseptal, basal inferior, mid inferseptal and mid inferior regions, consistent with infarct/scar and/or diaphragmatic attenuation; non-gated study secondary to ectopy; no CP or EKG changes for ischemia; abnormal study although no significant changes from previous study; in the absense of gated images cannot calculate EF or distinguish scar/artifact   COLONOSCOPY  08/2016   TAx5, sessile serrated polyp, diverticulosis, rpt 2 yrs ([place])   COLONOSCOPY  10/2020   1.5cm SSP, HP,  rpt [facility]ella Hartigan)   COLONOSCOPY  08/2020   multiple polpys removed (TA, HP), 1.5cm polyp biopsied (SSP), colon lipoma Christella Hartigan)   COLONOSCOPY WITH PROPOFOL N/A 11/11/2020   Procedure: COLONOSCOPY WITH PROPOFOL;  Surgeon: Rachael Fee, MD;  Location: [facility];  Service: Endoscopy;  Laterality: N/A;   COLOSTOMY Left 05/23/2016   Procedure: COLOSTOMY;  Surgeon: Avel Peace, MD;  Location: [facility];  Service: General;  Laterality: Left;   COLOSTOMY TAKEDOWN N/A 11/10/2016    Procedure: LAPAROSCOPIC LYSIS OF ADHESIONS AND  COLOSTOMY CLOSURE;  Surgeon: Avel Peace, MD;  Location: [facility];  Service: General;  Laterality: N/A;   CORONARY/GRAFT ACUTE MI REVASCULARIZATION N/A 08/06/2022   Procedure: Coronary/Graft Acute MI Revascularization;  Surgeon: Lyn Records, MD;  Location: [facility];  Service: Cardiovascular;  Laterality: N/A;   CYST EXCISION  04/2018   posterior neck ([place])   DOPPLER ECHOCARDIOGRAPHY  08/20/2008   EF >55%; LV systolic function normal; LV mildly dilated; doppler flow suggestive of impaired LV relaxation; RV mildly dilated, RV systolic function normal, RV systolic pressure normal   ESOPHAGOGASTRODUODENOSCOPY  08/2020   gastritis, duodenitis likely NSAID/ASA related, H pylori neg - rec stop aspirin Christella Hartigan)   INCISION AND DRAINAGE ABSCESS N/A 05/23/2016   Procedure: DRAINAGE OF MULTIPLE INTRA  ABDOMINAL ABSCESS;  Surgeon: Avel Peace, MD;  Location: [facility];  Service: General;  Laterality: N/A;   KNEE SURGERY Left 2015   torn meniscus Delbert Harness)   LAPAROSCOPIC LYSIS OF ADHESIONS  11/10/2016   Procedure: LAPAROSCOPIC LYSIS OF ADHESIONS;  Surgeon: Avel Peace, MD;  [facility];  Service: General;;   LAPAROTOMY N/A 05/23/2016   Procedure: EXPLORATORY LAPAROTOMY;  Surgeon: Avel Peace, MD;  [facility];  Service: General;  Laterality: N/A;   LEFT HEART CATH AND CORONARY ANGIOGRAPHY N/A 08/06/2022   Procedure: LEFT HEART CATH AND CORONARY ANGIOGRAPHY;  Surgeon: Lyn Records, MD;  [facility];  Service: Cardiovascular;  Laterality: N/A;   MASS EXCISION N/A 05/22/2018   Procedure: EXCISION OF CYST ON POSTERIOR NECK ERAS PATHWAY;  Surgeon: Harriette Bouillon, MD;  Location: [facility];  Service: General;  Laterality: N/A;   PERCUTANEOUS CORONARY STENT INTERVENTION (PCI-S)  1992, 1999, 2004   4 vessels Tresa Endo)   POLYPECTOMY  11/11/2020   Procedure: POLYPECTOMY;  Surgeon: Rachael Fee, MD;  [facility];  Service: Endoscopy;;   PROCTOSCOPY  11/10/2016   Procedure: PROCTOSCOPY;  Surgeon: Avel Peace, MD;  [facility];  Service: General;;   SUBMUCOSAL LIFTING INJECTION  11/11/2020   Procedure: SUBMUCOSAL LIFTING INJECTION;  Surgeon: Rachael Fee, MD;  Location: [facility];  Service: Endoscopy;;    Allergies  Allergies  Allergen Reactions   Dilaudid [Hydromorphone Hcl] Itching   Ambien [Zolpidem] Other (See Comments)    Pt states "makes me wild"   Niacin And Related Other (See Comments)    Extreme fatigue    Home Medications    Prior to Admission medications   Medication Sig Start Date End Date Taking? Authorizing Provider  aspirin EC 81 MG tablet Take 1 tablet (81 mg total) by mouth daily. Swallow whole. 08/11/22   Marykay Lex, MD  atorvastatin (LIPITOR) 80 MG tablet Take 1 tablet (80 mg total) by mouth daily. 11/28/22   Lennette Bihari, MD  cetirizine (ZYRTEC) 10 MG tablet Take 1 tablet (10 mg total) by mouth daily. 10/06/22   Eustaquio Boyden, MD  clopidogrel (PLAVIX) 75 MG tablet Take  1 tablet by mouth once daily 04/25/23   Eustaquio Boyden, MD  Coenzyme Q10 (COQ10) 200 MG CAPS Take 200 mg by mouth daily.    [provider]  cyanocobalamin (VITAMIN B12) 1000 MCG tablet Take 1 tablet (1,000 mcg total) by mouth daily. 06/22/23   Eustaquio Boyden, MD  empagliflozin (JARDIANCE) 10 MG TABS tablet Take 1 tablet (10 mg total) by mouth daily before breakfast. 09/06/22   Clegg, Amy D, NP  ezetimibe (ZETIA) 10 MG tablet Take 1 tablet (10 mg total) by mouth daily. 08/11/22   Marykay Lex, MD  fluticasone (FLONASE) 50 MCG/ACT nasal spray Place 2 sprays into both nostrils daily. 09/01/20   Eustaquio Boyden, MD  furosemide (LASIX) 20 MG tablet Take 1 tablet (20 mg total) by mouth daily. 11/28/22   Lennette Bihari, MD  gabapentin (NEURONTIN) 100 MG capsule Take 100 mg by mouth 3 (three) times daily.    [provider]   glimepiride (AMARYL) 2 MG tablet TAKE 1 TABLET BY MOUTH ONCE DAILY BEFORE BREAKFAST 04/24/23   Eustaquio Boyden, MD  metFORMIN (GLUCOPHAGE) 1000 MG tablet TAKE 1 TABLET BY MOUTH TWICE DAILY WITH MEALS 10/02/22   Eustaquio Boyden, MD  metoprolol succinate (TOPROL XL) 25 MG 24 hr tablet Take 0.5 tablets (12.5 mg total) by mouth daily. 09/07/22   Lennette Bihari, MD  Netarsudil-Latanoprost (ROCKLATAN) 0.02-0.005 % SOLN Apply 1 drop to eye at bedtime.    [provider]  nitroGLYCERIN (NITROSTAT) 0.4 MG SL tablet For chest pain, tightness, or pressure. While sitting, place 1 tablet under tongue. May be used every 5 minutes as needed, for up to 15 minutes. Do not use more than 3 tablets. 08/11/22   Marykay Lex, MD  omega-3 acid ethyl esters (LOVAZA) 1 g capsule Take 2 capsules by mouth twice daily 05/14/23   Eustaquio Boyden, MD  sacubitril-valsartan (ENTRESTO) 49-51 MG Take 1 tablet by mouth 2 (two) times daily. 09/06/22   Clegg, Amy D, NP  Semaglutide, 1 MG/DOSE, 4 MG/3ML SOPN Inject 1 mg as directed once a week. 06/26/23   Eustaquio Boyden, MD  senna-docusate (SENOKOT-S) 8.6-50 MG tablet Take 1 tablet by mouth at bedtime as needed for mild constipation. 06/02/16   Dhungel, Theda Belfast, MD  TURMERIC CURCUMIN PO Take 1 capsule by mouth daily.    [provider]    Physical Exam    Vital Signs:  Evan Avery does not have vital signs available for review today.  Given telephonic nature of communication, physical exam is limited. AAOx3. NAD. Normal affect.  Speech and respirations are unlabored.  Accessory Clinical Findings    None  Assessment & Plan    1.  Preoperative Cardiovascular Risk Assessment:  According to the Revised Cardiac Risk Index (RCRI), his    His   according to the Duke Activity Status Index (DASI).   Per office protocol, if patient is without any new symptoms or concerns at the time of their virtual visit, he/she may hold ASA for 7 days and Plavix for  5 days prior to procedure. Please resume aSA and Plavix as soon as possible postprocedure, at the discretion of the surgeon.    The patient was advised that if he develops new symptoms prior to surgery to contact our office to arrange for a follow-up visit, and he verbalized understanding.  Therefore, based on ACC/AHA guidelines, patient would be at acceptable risk for the planned procedure without further cardiovascular testing. I will route this recommendation to the  requesting party via Epic fax function.     A copy of this note will be routed to requesting surgeon.  Time:   Today, I have spent *** minutes with the patient with telehealth technology discussing medical history, symptoms, and management plan.     Joni Reining, NP  07/02/2023, 12:17 PM

## 2023-07-03 ENCOUNTER — Ambulatory Visit: Payer: PPO | Attending: Cardiology

## 2023-07-03 DIAGNOSIS — Z01810 Encounter for preprocedural cardiovascular examination: Secondary | ICD-10-CM

## 2023-07-03 DIAGNOSIS — Z01818 Encounter for other preprocedural examination: Secondary | ICD-10-CM

## 2023-07-21 ENCOUNTER — Other Ambulatory Visit: Payer: Self-pay | Admitting: Family Medicine

## 2023-07-21 DIAGNOSIS — E1169 Type 2 diabetes mellitus with other specified complication: Secondary | ICD-10-CM

## 2023-08-03 ENCOUNTER — Other Ambulatory Visit: Payer: Self-pay | Admitting: Family Medicine

## 2023-08-03 ENCOUNTER — Other Ambulatory Visit: Payer: Self-pay | Admitting: Cardiovascular Disease

## 2023-08-03 DIAGNOSIS — E1121 Type 2 diabetes mellitus with diabetic nephropathy: Secondary | ICD-10-CM

## 2023-08-03 NOTE — Telephone Encounter (Signed)
ERx 

## 2023-08-13 ENCOUNTER — Telehealth: Payer: Self-pay | Admitting: Family Medicine

## 2023-08-13 ENCOUNTER — Other Ambulatory Visit (INDEPENDENT_AMBULATORY_CARE_PROVIDER_SITE_OTHER): Payer: PPO | Admitting: Pharmacist

## 2023-08-13 DIAGNOSIS — Z7985 Long-term (current) use of injectable non-insulin antidiabetic drugs: Secondary | ICD-10-CM

## 2023-08-13 DIAGNOSIS — E118 Type 2 diabetes mellitus with unspecified complications: Secondary | ICD-10-CM

## 2023-08-13 NOTE — Progress Notes (Addendum)
08/13/2023 Name: TREVORIS PIETERS MRN: [id number] DOB: 02/11/1946  Subjective  Chief Complaint  Patient presents with   Medication Access    Reason for visit: ARUL BARTHOL is a 77 y.o. year old male who presented for a telephone visit.   They were referred to the pharmacist by their PCP for assistance in managing medication access.   Care Team: Primary Care Provider: Eustaquio Boyden, MD  Reason for visit: ?  KAYGE BOURGEOIS is a 77 y.o. male who presents today for a phone visit with the pharmacist due to medication access concerns regarding their Ozempic. ?   Medication Access: ?  Prescription drug coverage: Payor: [employer] / Plan: [employer] PPO / Product Type: *No Product type* / .   Reports that all medications are not affordable. Ozempic is no longer affordable at this time.   Current Patient Assistance: None  MAP-Eligible Medications: Valla Leaver, and Ozempic  Patient lives in a household of 2 with an income from social security and   Medicare LIS Eligible: No  Couples Asset Limit: $34,360 (home owner)  Assessment and Plan:   1. Medication Access Patient lives in a household of 2 with an income consisting of only social security which patient's wife believes is below 400% FPL and therefore is likely eligible for MAP. Wife will speak with her husband and call back with estimate of income.  Patient called back reporting annual combined income of $53k Patient is not eligible for copay cards due to government insurance. Nov[employer] application started today. Will collaborate with CPhT team to facilitate MAP assistance. Patient requests either online application, or the application be mailed to his home for signature.   Future Appointments  Date Time Provider Department Center  10/03/2023  9:20 AM Lennette Bihari, MD [facility] None  11/13/2023  9:30 AM Eustaquio Boyden, MD [facility]  02/12/2024  2:00 PM [facility] ANNUAL WELLNESS VISIT 1  [facility]    Loree Fee, PharmD Clinical Pharmacist [facility] [phone]

## 2023-08-13 NOTE — Telephone Encounter (Signed)
I spoke with wife at her office visit.  Pt having trouble affording Ozempic 1mg  weekly due to be in donut hole.  Can we see if pt qualifies for PAP? Thank you!

## 2023-08-22 ENCOUNTER — Encounter: Payer: Self-pay | Admitting: Pharmacist

## 2023-08-22 NOTE — Progress Notes (Signed)
Prescriber pages of [employer] application completed and placed in PCP in box for signature.  Ozempic 1.0 mg pens #4 pens  Will have PCP sign the MAP provider form then fax to our CPhT Med Assistance team: MAP pharmacy technician team fax: [phone]   Loree Fee, PharmD Clinical Pharmacist [facility] [phone]

## 2023-08-27 LAB — LAB REPORT - SCANNED
A1c: 7.8
Creatinine, POC: 15.3 mg/dL
Microalb Creat Ratio: 137.6
Microalbumin, Urine: 2.1

## 2023-08-27 LAB — CBC AND DIFFERENTIAL
Hemoglobin: 14.5 (ref 13.5–17.5)
Platelets: 141 10*3/uL — AB (ref 150–400)
WBC: 5.2

## 2023-08-27 LAB — LIPID PANEL
Cholesterol: 99 (ref 0–200)
HDL: 25 — AB (ref 35–70)
LDL Cholesterol: 28
Triglycerides: 296 — AB (ref 40–160)

## 2023-08-27 LAB — TSH: TSH: 1.13 (ref 0.41–5.90)

## 2023-08-27 LAB — COMPREHENSIVE METABOLIC PANEL
Albumin: 4.9 (ref 3.5–5.0)
eGFR: 55

## 2023-08-27 LAB — HEPATIC FUNCTION PANEL
ALT: 20 U/L (ref 10–40)
AST: 14 (ref 14–40)
Alkaline Phosphatase: 78 (ref 25–125)
Bilirubin, Direct: 0.2
Bilirubin, Total: 0.6

## 2023-08-27 LAB — BASIC METABOLIC PANEL
Creatinine: 1 (ref 0.6–1.3)
Glucose: 188
Potassium: 4.6 meq/L (ref 3.5–5.1)
Sodium: 140 (ref 137–147)

## 2023-08-27 NOTE — Telephone Encounter (Signed)
Placed signed form in Evan Avery's folder.

## 2023-08-29 NOTE — Telephone Encounter (Signed)
Noted. (See additional messages by clicking blue 'View Conversation' link.)

## 2023-09-06 ENCOUNTER — Telehealth: Payer: Self-pay

## 2023-09-06 ENCOUNTER — Other Ambulatory Visit (HOSPITAL_COMMUNITY): Payer: Self-pay

## 2023-09-06 NOTE — Telephone Encounter (Signed)
-----   Message from Loree Fee sent at 08/13/2023  2:50 PM EDT ----- Hello! Patient lives in a household of 2 with estimated income from just SSI (~$53k). Could patient be assisted in submitting application to [employer] for Ozempic 1 mg sq weekly

## 2023-09-06 NOTE — Telephone Encounter (Signed)
Have submitted online portion of pt's application, waiting on pcp portion to be faxed back

## 2023-09-17 NOTE — Telephone Encounter (Signed)
PAP: Patient assistance application for Ozempic has been approved by PAP Companies: [employer] from 09/08/2023 to 09/07/2024. Medication should be delivered to PAP Delivery: Provider's office For further shipping updates, please contact [employer] at [phone] Pt ID is: [id number]

## 2023-10-03 ENCOUNTER — Ambulatory Visit: Payer: PPO | Attending: Cardiovascular Disease | Admitting: Cardiovascular Disease

## 2023-10-03 ENCOUNTER — Encounter: Payer: Self-pay | Admitting: Cardiovascular Disease

## 2023-10-03 DIAGNOSIS — E785 Hyperlipidemia, unspecified: Secondary | ICD-10-CM

## 2023-10-03 DIAGNOSIS — I25110 Atherosclerotic heart disease of native coronary artery with unstable angina pectoris: Secondary | ICD-10-CM | POA: Diagnosis not present

## 2023-10-03 DIAGNOSIS — Z720 Tobacco use: Secondary | ICD-10-CM

## 2023-10-03 DIAGNOSIS — I700 Atherosclerosis of aorta: Secondary | ICD-10-CM

## 2023-10-03 DIAGNOSIS — I255 Ischemic cardiomyopathy: Secondary | ICD-10-CM

## 2023-10-03 DIAGNOSIS — I10 Essential (primary) hypertension: Secondary | ICD-10-CM

## 2023-10-03 DIAGNOSIS — E66812 Obesity, class 2: Secondary | ICD-10-CM

## 2023-10-03 DIAGNOSIS — I771 Stricture of artery: Secondary | ICD-10-CM

## 2023-10-03 DIAGNOSIS — G4733 Obstructive sleep apnea (adult) (pediatric): Secondary | ICD-10-CM

## 2023-10-03 NOTE — Progress Notes (Signed)
Patient ID: HIXON SANDO, male   DOB: 05-Apr-1946, 77 y.o.   MRN: [id number]      Primary M.D.: Dr. Sharen Hones  HPI: ANTINO PRUDE is a 77 y.o. male who presents today for a 6 month cardiology evaluation.  Mr. Kimak has known CAD and has a total of 4 stents in his RCA. In 1992, he underwent initial PTCA of his RCA. Subsequently, he underwent stenting of the proximal and mid RCA.  In 1999 his fourth stent was placed in the mid RCA between the 2 previously placed stents and was a 3.5x28 mm non-DES Liberty stent. His last nuclear perfusion study in August 2012 showed inferior thinning without significant ischemia.  EF was not calculated secondary to  ectopy but previously had been 52%.  Additional problems include hypertension, hyperlipidemia, obesity, and obstructive sleep apnea for which he is he uses CPAP 100% of the time and uses  [facility] for his [employer] [facility].  Since I last saw him in May 2017.  He denied any recurrent episodes of chest pain or significant dyspnea.  He had a new CPAP machine and continue to be compliant.  He established primary care with Dr. Sharen Hones.  Review of laboratory that he had with him in December 2016 has shown a cholesterol of 149, HDL 27, but his triglycerides were 413.  Remotely he had triglyceride elevations as high as 584.  In December, his hemoglobin A1c was 7.7.  He underwent surgery in July 2017 due to an abscess in his colon.  He required a colostomy for 6 months and in January 2018 underwent colostomy closure.   I saw him in October 2018 after not having seen him in over 17 months.  Since that evaluation, he has undergone 2 resections for cyst on the back of his neck with recurrent infections.  He recently saw Dr. Luisa Hart consideration of surgical excision.  He has felt well with reference to chest pain or shortness of breath.  He had smoked for many years but quit smoking for 10 years but unfortunately resumed smoking in June 2018 and currently  smoking 1 pack/day.  He has not had a nuclear perfusion study since 2012.  He continues to use CPAP with 100% compliance.  He was  started on Januvia for his diabetes mellitus by his primary physician.    When I saw hime in June 2019  he was in need of back and neck surgery.  I recommended he undergo a Lexiscan Myoview study for preoperative assessment.  This was done on April 23, 2018 which showed a small inferior defect, low risk.  EF was 50% with basal inferior hypokinesis.  He tolerated his neck and back surgery well.  Is only he is without anginal symptoms.  He has a documented umbilical/ventral  hernia and has been without abdominal discomfort.  He continues to use CPAP with 100% compliance.  He received a new CPAP machine at the [facility] is a ResMed AirSense 10.  He gets his supplies from the [place].    I saw him in February 2020 and  saw him in January 2021.  He denied any chest pain  with walking but does admit to some mild shortness of breath.  Unfortunately he still smokes 1 pack of cigarettes per day.  He had experienced some throat burning which had improved with pantoprazole.  He has an umbilical hernia and ventral hernia but denies any pain.  He continues to be on aspirin and Plavix for  DAPT.  He is on atorvastatin 40mg ,  Zetia 10 mg and Lovaza 2 capsules twice daily for mixed hyperlipidemia.  He continues to be on lisinopril 5 mg and atenolol 50 mg in the morning and 25 mg in the evening for hypertension.  There are no palpitations.  He is diabetic on glimepiride and Metformin.  He was bradycardic but was entirely asymptomatic.  I saw him in July 2021 at that time he stated that he had experienced  several episodes of chest heaviness.  His episodes were short-lived times he would take Tums with benefit.  Unfortunately he still smokes cigarettes.  He has been drinking an excessive amount of coffee typically at least 6 cups/day.  There was some dyspepsia and he recently started taking Pepcid with  some improvement.  He apparently is need to undergo endoscopy to be done at the [place]. during that evaluation, I again discussed the importance of smoking cessation.  With his longstanding history of CAD, and recent chest discomfort I recommended he undergo 2-year follow-up Lexiscan Myoview for further evaluation.    He underwent a [place] Myoview study on May 11, 2020.  There was evidence for small size mild fixed mid inferior perfusion defect most likely scar.  There was no associated ischemia.  EF was 43% with basal to mid inferior hypokinesis.  There was no change from the prior study in 2019.  I saw him on June 21, 2020 at which time he felt improved.  He denied any recurrent chest pain or exertional symptoms.  He was trying to significantly reduce his tobacco which previously had been 1 to 2 packs/day and most recently was 1 pack/day.  During that evaluation, his ECG showed marked sinus bradycardia at 43 bpm.  I recommended he reduce his a atenolol to 25 mg twice a day.  I saw him on December 20, 2020 and since his prior evaluation he had remained cardiovascularly stable.    He has issues with GERD and is now on Pepcid 40 mg twice daily followed by Dr. Christella Hartigan of GI.  He continues to use CPAP which is now followed at the [place].  Unfortunately he is still smoking 1 pack/day.  He will be undergoing repeat laboratory in 2 weeks with Dr. Sharen Hones.  He denied any recurrent chest pain PND orthopnea, palpitations, presyncope or syncope.  I saw him on 2021/11/13.  Unfortunately his son died at 29 in 03/03/2021.  He had significant health related issues prior to his death.  Mr. Mckinzie denies any chest pain.  He is still smoking approximately 1 pack/day.  He continues to use CPAP 100% compliance.  He continues to have issues with GERD and is on therapy.  His last stress test was done in July 2021 which showed a nuclear EF at 43%.  There was evidence for fixed mid inferior perfusion defect most likely scar.   At times he does note a vague chest sensation which he feels may be his GERD, but at times he did experience some left arm radiation.  He denies presyncope or syncope.  He is unaware of palpitations.  At times he has experienced some mild ankle.  His ECG showed sinus bradycardia at 43 bpm and I recommended he reduce atenolol from 25 mg twice a day to 25 mg in the morning and 12.5 mg at night.  I also empirically added isosorbide 30 mg to his medical regimen to provide potential anti-ischemic benefit.  On August 06, 2022 he suffered a  witnessed cardiac arrest at home.  His wife was there and a bystander CPR was initiated.  EMS arrived and he received 6 shocks for VF before ROSC.  He was intubated.  He underwent cardiac catheterization by Dr. Katrinka Blazing and was found to have 99% mid RCA stenosis just above the previously placed bare-metal stent.  There were left-to-right collaterals.  The RCA was heavily calcified with the proximal segment having 50% stenosis.  He underwent PCI with overlapping synergy DES 3.5 x 28 mm stent inserted postdilated to 3.75 mm.  The 99% lesion was reduced to 0 but there was persistent underexpansion of approximately 50% in the more proximal segment.  Acute EF was estimated at 30 to 35% with severe hypokinesis to akinesis in the inferior wall basal to apical.  On August 07, 2022 echo Doppler study showed EF now at 45 to 50% with inferior hypokinesis.  There was mild biatrial dilation, mild mitral annular calcification as well as mild aortic calcification with aortic sclerosis without stenosis.  He was ultimately discharged on August 10, 2022 on aspirin/Plavix, Jardiance 10 mg, Entresto 24/26 mL twice a day, furosemide 20 mg to take as needed in addition to atorvastatin 80 mg, Zetia 10 mg and Lovaza 2 capsules twice a day, metformin and glimepiride for diabetes, and Neurontin.  He was evaluated on August 23, 2022 in the transition of care clinic heart failure pharmacy encounter and  samples were provided.  I saw him on September 07, 2022 at which time he felt well and denied any recurrent chest pain or shortness of breath.  He denied any PND, orthopnea, presyncope or syncope and was unaware of palpitations.  Was continuing to use CPAP prescribed at the [place] where he gets his supplies.  However he has not had any follow-up assessment of his AHI result.    I saw him on November 28, 2022. Since I saw him continues to feel well and has been without recurrent chest pain or shortness of breath.  He is unaware of palpitations.  He continues to be on aspirin/Plavix for DAPT.  He is on atorvastatin 80 mg, Zetia 10 mg Lovaza 2 g twice a day for mixed hyperlipidemia.  With reference to his used LV dysfunction he is on Jardiance 10 mg, Entresto 49/51 mg twice a day, furosemide 20 mg daily and metoprolol succinate 12.5 mg.  He is diabetic on metformin.  He has continued to use CPAP.  Apparently we were able to obtain data from his CPAP machine.  He tells me he has never seen a doctor regarding his pressures.  He is compliant with excellent use averaging 9 hours and 4 minutes per night.  His pressure is set at a range of 5 to 12 cm.  AHI is elevated at 12.4 with an apnea index of 10.9 and central apnea index of 2.7.  He does have a mask leak.  His 95th percentile pressure is 11.1 with maximum average pressure 11.8.   I last saw him on April 13, 2023.  At that time he was continuing to do well and on his current medical regimen denied any recent chest pain or significant shortness of breath.  He is unaware of any recent palpitations.  An echo Doppler study done on Feb 27, 2023 shows complete recovery of global LV function with a EF at 60 to 65%.  He has mild LVH and mild hypokinesis of the basal inferior to inferolateral wall.  He has mild aortic sclerosis without stenosis.  EF  has significantly improved from prior postcardiac arrest study.  He has continued to use CPAP which supposedly is followed at the [place]  but he has not had an evaluation recently.  His device is also linked to our office.  He has a ResMed air sense 10 AutoSet unit which apparently has been set at a pressure range of 7 to 14 cm.  At times he sleeps okay but other times he does not feel like his sleep is good.  He has nocturia 2-3 times per night.  On his download that we were able to obtain today from May 21 through April 11, 2023 he has 100% usage compliance with average use at 9 hours per night.  AHI is significantly elevated at 18.2 and he does have some central events with a central apnea index of 7.4.  Slight adjustments were made and his pressure range was changed to 9 to 16 cm of water.  [facility] and if continued central events were present a new BiPAP or possible ASV titration may be necessary.  He has recently been started on Ozempic for the past several weeks.    Since I saw him, he tells me he had received a new mask from the [place] and apparently is about to receive a new device for his obstructive sleep apnea.  I am not certain if this is a BiPAP or just a CPAP upgrade.  He continues to be remarkably stable from a cardiac standpoint and denies any chest pain or shortness of breath.  He continues to be on DAPT with aspirin/Plavix.  As noted on echo, LV function had improved and normalized on Jardiance 10 mg, Entresto 49/51 mg twice a day, furosemide 20 mg, and metoprolol succinate 12.5 mg daily.  He is diabetic on metformin and glimepiride.  He is on semaglutide weekly injection.  He is on Zetia 10 mg, atorvastatin 80 mg, and Lovaza 2 capsules twice a day.  He presents for evaluation.  Past Medical History:  Diagnosis Date   Agent orange exposure 1968   Arthritis    CAD (coronary artery disease)    4 stents in right artery   Complex tear of medial meniscus of left knee as current injury 07/26/2012   [place]   Diabetes mellitus    type 2   Diverticulitis of colon with perforation 05/23/2016   DVT (deep  venous thrombosis) (HCC)    GERD (gastroesophageal reflux disease)    Glaucoma 2006   [facility] drops[facility]   Hx of colonic polyps    Hyperlipidemia    Hypertension    Intra-abdominal abscess (HCC) 05/16/2016   LUMBAR RADICULOPATHY 07/18/2007   Qualifier: Diagnosis of  By: Lovell Sheehan MD, John E    Myocardial infarction [facility])    1992   Sleep apnea    uses CPAP regularly    Past Surgical History:  Procedure Laterality Date   APPENDECTOMY  1958   BOWEL RESECTION N/A 05/23/2016   Procedure: SMALL BOWEL RESECTION;  Surgeon: Avel Peace, MD;  Location: [facility];  Service: General;  Laterality: N/A;   CARDIAC CATHETERIZATION  08/11/2008   angiographically patent LAD diagonal, circumflex and multiple circumflex obtuse marginal branches from the L coronary artery; stenting of mid R coronary artery with 3.5 x 28mm Liberte non DES postdilated w/ 4.0 DuraStent Tresa Endo)   CARDIOVASCULAR STRESS TEST  06/16/2011   R/P MV - moderate perfusion defect in basal inferoseptal, basal inferior, mid inferseptal and mid inferior  regions, consistent with infarct/scar and/or diaphragmatic attenuation; non-gated study secondary to ectopy; no CP or EKG changes for ischemia; abnormal study although no significant changes from previous study; in the absense of gated images cannot calculate EF or distinguish scar/artifact   COLONOSCOPY  08/2016   TAx5, sessile serrated polyp, diverticulosis, rpt 2 yrs ([place])   COLONOSCOPY  10/2020   1.5cm SSP, HP, rpt Christella Hartigan)   COLONOSCOPY  08/2020   multiple polpys removed (TA, HP), 1.5cm polyp biopsied (SSP), colon lipoma Christella Hartigan)   COLONOSCOPY WITH PROPOFOL N/A 11/11/2020   Procedure: COLONOSCOPY WITH PROPOFOL;  Surgeon: Rachael Fee, MD;  Location: [facility];  Service: Endoscopy;  Laterality: N/A;   COLOSTOMY Left 05/23/2016   Procedure: COLOSTOMY;  Surgeon: Avel Peace, MD;  Location: [facility];  Service: General;  Laterality: Left;   COLOSTOMY TAKEDOWN N/A 11/10/2016    Procedure: LAPAROSCOPIC LYSIS OF ADHESIONS AND  COLOSTOMY CLOSURE;  Surgeon: Avel Peace, MD;  Location: [facility];  Service: General;  Laterality: N/A;   CORONARY/GRAFT ACUTE MI REVASCULARIZATION N/A 08/06/2022   Procedure: Coronary/Graft Acute MI Revascularization;  Surgeon: Lyn Records, MD;  [facility]: [facility];  Service: Cardiovascular;  Laterality: N/A;   CYST EXCISION  04/2018   posterior neck ([place])   DOPPLER ECHOCARDIOGRAPHY  08/20/2008   EF >55%; LV systolic function normal; LV mildly dilated; doppler flow suggestive of impaired LV relaxation; RV mildly dilated, RV systolic function normal, RV systolic pressure normal   ESOPHAGOGASTRODUODENOSCOPY  08/2020   gastritis, duodenitis likely NSAID/ASA related, H pylori neg - rec stop aspirin Christella Hartigan)   INCISION AND DRAINAGE ABSCESS N/A 05/23/2016   Procedure: DRAINAGE OF MULTIPLE INTRA  ABDOMINAL ABSCESS;  Surgeon: Avel Peace, MD;  Location: [facility];  Service: General;  Laterality: N/A;   KNEE SURGERY Left 2015   torn meniscus Delbert Harness)   LAPAROSCOPIC LYSIS OF ADHESIONS  11/10/2016   Procedure: LAPAROSCOPIC LYSIS OF ADHESIONS;  Surgeon: Avel Peace, MD;  Location: [facility];  Service: General;;   LAPAROTOMY N/A 05/23/2016   Procedure: EXPLORATORY LAPAROTOMY;  Surgeon: Avel Peace, MD;  Location: [facility];  Service: General;  Laterality: N/A;   LEFT HEART CATH AND CORONARY ANGIOGRAPHY N/A 08/06/2022   Procedure: LEFT HEART CATH AND CORONARY ANGIOGRAPHY;  Surgeon: Lyn Records, MD;  [facility];  Service: Cardiovascular;  Laterality: N/A;   MASS EXCISION N/A 05/22/2018   Procedure: EXCISION OF CYST ON POSTERIOR NECK ERAS PATHWAY;  Surgeon: Harriette Bouillon, MD;  Location: [facility];  Service: General;  Laterality: N/A;   PERCUTANEOUS CORONARY STENT INTERVENTION (PCI-S)  1992, 1999, 2004   4 vessels Tresa Endo)   POLYPECTOMY  11/11/2020   Procedure: POLYPECTOMY;  Surgeon: Rachael Fee,  MD;  [facility];  Service: Endoscopy;;   PROCTOSCOPY  11/10/2016   Procedure: PROCTOSCOPY;  Surgeon: Avel Peace, MD;  Location: [facility];  Service: General;;   SUBMUCOSAL LIFTING INJECTION  11/11/2020   Procedure: SUBMUCOSAL LIFTING INJECTION;  Surgeon: Rachael Fee, MD;  Location: [facility];  Service: Endoscopy;;    Allergies  Allergen Reactions   Dilaudid [Hydromorphone Hcl] Itching   Ambien [Zolpidem] Other (See Comments)    Pt states "makes me wild"   Niacin And Related Other (See Comments)    Extreme fatigue    Current Outpatient Medications  Medication Sig Dispense Refill   aspirin EC 81 MG tablet Take 1 tablet (81 mg total) by mouth daily. Swallow whole. 90 tablet 3  atorvastatin (LIPITOR) 80 MG tablet Take 1 tablet (80 mg total) by mouth daily. 90 tablet 3   cetirizine (ZYRTEC) 10 MG tablet Take 1 tablet (10 mg total) by mouth daily. 90 tablet 3   clopidogrel (PLAVIX) 75 MG tablet Take 1 tablet by mouth once daily 90 tablet 1   Coenzyme Q10 (COQ10) 200 MG CAPS Take 200 mg by mouth daily.     cyanocobalamin (VITAMIN B12) 1000 MCG tablet Take 1 tablet (1,000 mcg total) by mouth daily.     empagliflozin (JARDIANCE) 10 MG TABS tablet Take 1 tablet (10 mg total) by mouth daily before breakfast. 90 tablet 3   ezetimibe (ZETIA) 10 MG tablet Take 1 tablet (10 mg total) by mouth daily. 90 tablet 3   fluticasone (FLONASE) 50 MCG/ACT nasal spray Place 2 sprays into both nostrils daily. 16 g 1   furosemide (LASIX) 20 MG tablet Take 1 tablet (20 mg total) by mouth daily. 90 tablet 3   gabapentin (NEURONTIN) 100 MG capsule Take 100 mg by mouth 3 (three) times daily.     glimepiride (AMARYL) 2 MG tablet TAKE 1 TABLET BY MOUTH ONCE DAILY BEFORE BREAKFAST 90 tablet 1   metFORMIN (GLUCOPHAGE) 1000 MG tablet TAKE 1 TABLET BY MOUTH TWICE DAILY WITH MEALS 180 tablet 0   metoprolol succinate (TOPROL-XL) 25 MG 24 hr tablet Take 1/2 (one-half) tablet by mouth once daily 45 tablet  3   Netarsudil-Latanoprost (ROCKLATAN) 0.02-0.005 % SOLN Apply 1 drop to eye at bedtime.     nitroGLYCERIN (NITROSTAT) 0.4 MG SL tablet For chest pain, tightness, or pressure. While sitting, place 1 tablet under tongue. May be used every 5 minutes as needed, for up to 15 minutes. Do not use more than 3 tablets. 25 tablet 3   omega-3 acid ethyl esters (LOVAZA) 1 g capsule Take 2 capsules by mouth twice daily 360 capsule 0   sacubitril-valsartan (ENTRESTO) 49-51 MG Take 1 tablet by mouth 2 (two) times daily. 180 tablet 3   Semaglutide, 1 MG/DOSE, 4 MG/3ML SOPN Inject 1 mg as directed once a week. 3 mL 7   senna-docusate (SENOKOT-S) 8.6-50 MG tablet Take 1 tablet by mouth at bedtime as needed for mild constipation. 15 tablet 0   TURMERIC CURCUMIN PO Take 1 capsule by mouth daily.     No current facility-administered medications for this visit.    Socially he is married has 2 children 2 grandchildren. He does walk several times a week. He does have a tobacco history. There is no alcohol use.   ROS General: Negative; No fevers, chills, or night sweats; positive for obesity. HEENT: Negative; No changes in vision or hearing, sinus congestion, difficulty swallowing Pulmonary: Negative; No cough, wheezing, shortness of breath, hemoptysis Cardiovascular: See HPI GI: Colonic abscess requiring colostomy, and ultimate closure; positive for dyspepsia GU: Negative; No dysuria, hematuria, or difficulty voiding Musculoskeletal: He is status post left knee surgery; no myalgias, joint pain, or weakness Hematologic/Oncology: Negative; no easy bruising, bleeding Endocrine: Negative; no heat/cold intolerance; no diabetes Neuro: Negative; no changes in balance, headaches Skin: Recurrent cysts in the back of his neck Psychiatric: Negative; No behavioral problems, depression Sleep: Positive for sleep apnea, on CPAP with splint compliance. No snoring, daytime sleepiness, hypersomnolence, bruxism, restless legs,  hypnogognic hallucinations, no cataplexy Other comprehensive 14 point system review is negative.   PE BP 112/80   Pulse (!) 59   Ht 5\' 8"  (1.727 m)   Wt 230 lb 6.4 oz (104.5 kg)  SpO2 92%   BMI 35.03 kg/m    Repeat blood pressure by me was 104/68.  Wt Readings from Last 3 Encounters:  10/03/23 230 lb 6.4 oz (104.5 kg)  06/13/23 236 lb 12.8 oz (107.4 kg)  04/13/23 236 lb 12.8 oz (107.4 kg)   General: Alert, oriented, no distress.  Skin: normal turgor, no rashes, warm and dry HEENT: Normocephalic, atraumatic. Pupils equal round and reactive to light; sclera anicteric; extraocular muscles intact;  Nose without nasal septal hypertrophy Mouth/Parynx benign; Mallinpatti scale 3 Neck: Thick neck; no JVD, no carotid bruits; normal carotid upstroke Lungs: clear to ausculatation and percussion; no wheezing or rales Chest wall: without tenderness to palpitation Heart: PMI not displaced, RRR, s1 s2 normal, 1/6 systolic murmur, no diastolic murmur, no rubs, gallops, thrills, or heaves Abdomen: Ventral hernia; soft, nontender; no hepatosplenomehaly, BS+; abdominal aorta nontender and not dilated by palpation. Back: no CVA tenderness Pulses 2+ Musculoskeletal: full range of motion, normal strength, no joint deformities Extremities: no clubbing cyanosis or edema, Homan's sign negative  Neurologic: grossly nonfocal; Cranial nerves grossly wnl Psychologic: Normal mood and affect  EKG Interpretation Date/Time:  Wednesday October 03 2023 09:25:40 EST Ventricular Rate:  59 PR Interval:  206 QRS Duration:  90 QT Interval:  454 QTC Calculation: 449 R Axis:   8  Text Interpretation: Sinus bradycardia When compared with ECG of 13-Apr-2023 15:20, Premature atrial complexes are no longer Present Confirmed by Nicki Guadalajara ([id number]) on 10/05/2023 11:54:03 AM    September 07, 2022 ECG (independently read by me): NSR at 73, No STT changes  October 25, 2021 ECG (independently read by me):  Sinus  bradycardia at 43; mild T wave abnormality  December 20, 2020 ECG (independently read by me): Sinus bradycardia at 49  August 2021 ECG (independently read by me): Sinus Bradycardia at 43; no ectopy; normal intervals  July 2021 ECG (independently read by me): Sinus bradycardia at 43 bpm.  No significant ST-T changes.  Normal intervals.  No ectopy  November 10, 2019 ECG (independently read by me):Sinus bradycardia at 46 bpm.  Nonspecific T changes.  Normal intervals.  No ectopy  February 2020 ECG (independently read by me): Sinus Bradycardioa 51; Norml intervals  June 2018 ECG (independently read by me): Sinus bradycardia 57 bpm.  No ectopy.  Normal intervals.  October 2018 ECG (independently read by me): Normal sinus rhythm with PAC.  PR interval 168 ms.  QTc interval 454 ms.  May 2017 ECG (independently read by me): Sinus bradycardia 52 bpm.  T-wave abnormality in leads 3 and aVF.  May 2016 ECG (independently read by me): Sinus bradycardia 53 bpm.  Nondiagnostic T changes with T-wave inversion in lead 3.  June 2015 ECG (independently read by me):  sinus bradycardia at 55 QTc interval 447 ms.  PR interval 174 ms.  Nondiagnostic T changes in lead 3.  ECG: Sinus rhythm at 56 beats per minute. No ectopy. Normal intervals; nonspecific T changes in lead 3.  LABS:    Latest Ref Rng & Units 06/13/2023   10:29 AM 02/05/2023   10:37 AM 08/29/2022   11:43 AM  BMP  Glucose 70 - 99 mg/dL 604  540  981   BUN 6 - 23 mg/dL 23  23  30    Creatinine 0.40 - 1.50 mg/dL 1.91  4.78  2.95   Sodium 135 - 145 mEq/L 138  136  141   Potassium 3.5 - 5.1 mEq/L 4.2  4.1  4.3   Chloride 96 -  112 mEq/L 103  102  100   CO2 19 - 32 mEq/L 26  24  26    Calcium 8.4 - 10.5 mg/dL 9.6  9.6  9.9       Latest Ref Rng & Units 06/13/2023   10:29 AM 02/05/2023   10:37 AM 08/07/2022    5:23 PM  Hepatic Function  Total Protein 6.0 - 8.3 g/dL 6.8   5.5   Albumin 3.5 - 5.2 g/dL 4.4  4.4  3.1   AST 0 - 37 U/L 13   204   ALT  0 - 53 U/L 13   134   Alk Phosphatase 39 - 117 U/L 66   42   Total Bilirubin 0.2 - 1.2 mg/dL 0.6   0.8        Latest Ref Rng & Units 06/13/2023   10:31 AM 02/05/2023   10:37 AM 08/23/2022   10:36 AM  CBC  WBC 4.0 - 10.5 K/uL 6.6  5.5  5.9   Hemoglobin 13.0 - 17.0 g/dL 57.8  46.9  62.9   Hematocrit 39.0 - 52.0 % 40.7  39.9  39.1   Platelets 150.0 - 400.0 K/uL 148.0  147.0  228    Lab Results  Component Value Date   TSH 0.90 06/13/2023   Lab Results  Component Value Date   HGBA1C 9.1 (H) 06/13/2023   Lipid Panel     Component Value Date/Time   CHOL 118 06/13/2023 1029   CHOL 149 03/13/2016 0803   TRIG (H) 06/13/2023 1029    436.0 Triglyceride is over 400; calculations on Lipids are invalid.   TRIG 387 (H) 03/13/2016 0803   HDL 24.20 (L) 06/13/2023 1029   HDL 26 (L) 03/13/2016 0803   CHOLHDL 5 06/13/2023 1029   VLDL 37.0 05/30/2022 0726   LDLCALC 20 05/30/2022 0726   LDLCALC 46 03/13/2016 0803   LDLDIRECT 28.0 06/13/2023 1029    RADIOLOGY: No results found.  Eugenie Birks Myoview 05/11/2020 Study Highlights    The left ventricular ejection fraction is moderately decreased (30-44%). Nuclear stress EF: 43%. No T wave inversion was noted during stress. There was no ST segment deviation noted during stress. Defect 1: There is a small defect of mild severity present in the mid inferior location. Findings consistent with prior myocardial infarction. This is an intermediate risk study.   Small size, mild severity fixed mid inferior perfusion defect, likely scar. LVEF 43% with basal to mid inferior hypokinesis. This is an intermediate risk study. Compared to a prior study in 2019, the LVEF is calculated lower, but appears visually higher - the perfusion defect appears unchanged.    CATH/PCI: 08/06/2022 CONCLUSIONS: Cardiac arrest in the setting of ACS identified that the mid RCA above a previously placed stent is 99% stenosis with TIMI grade II flow and recruited  left-to-right collaterals distally.  The vessel is heavily calcified and the entire proximal segment is segmentally 50% narrowed. The culprit lesion was dilated and then stented with a 28 x 3.5 mm Synergy postdilated using high-pressure with a 3.75 x 22 Collins balloon to 17 atm x 2.  TIMI grade III flow established.  0% stenosis was noted at the site of the 99% stenosis but persistent 50% underexpansion was noted proximal to the culprit. Left main is widely patent LAD is widely patent Circumflex is widely patent Inferior wall to the apex is severely hypokinetic to akinetic.  EDP is 17.  EF is 30 to 35%.   RECOMMENDATIONS:   Unknown  if patient took Plavix this morning.  Cangrelor started in Cath Lab.  Once NG tube is in place cangrelor will be discontinued and 300 mg of clopidogrel given per NG tube. Management of ventilator and postcardiac arrest state by critical care medicine.  Intention is for patient to be on aspirin and Plavix. Sheath will be pulled according to protocol when ACT/Angiomax are at target.      Intervention        ECHO: 02/27/2023  1. Left ventricular ejection fraction, by estimation, is 60 to 65%. The  left ventricle has normal function. The left ventricle demonstrates  regional wall motion abnormalities (see scoring diagram/findings for  description). There is mild concentric left  ventricular hypertrophy. Left ventricular diastolic parameters are  indeterminate. There is mild hypokinesis of the left ventricular, basal  inferior wall and inferolateral wall.   2. Right ventricular systolic function is normal. The right ventricular  size is normal.   3. The mitral valve is grossly normal. Trivial mitral valve  regurgitation. No evidence of mitral stenosis.   4. The aortic valve is tricuspid. There is mild calcification of the  aortic valve. There is mild thickening of the aortic valve. Aortic valve  regurgitation is not visualized. Aortic valve sclerosis is present,  with  no evidence of aortic valve stenosis.   Comparison(s): Changes from prior study are noted. EF improved compared to  prior.   IMPRESSION:  1. Coronary artery disease involving native coronary artery of native heart with unstable angina pectoris (HCC)   2. Ischemic cardiomyopathy: improved   3. Essential hypertension   4. OSA (obstructive sleep apnea)   5. Hyperlipidemia with target LDL less than 55   6. Obesity, Class II, BMI 35-39.9   7. Tobacco abuse     ASSESSMENT AND PLAN: Mr. Alpers  is a 77 year old Caucasian male who has CAD and underwent initial PTCA to his RCA in 1992.  At catheterization in October 2009 a fourth stent was placed in his RCA.   He has been maintained on dual antiplatelet therapy with aspirin and Plavix with his multiple stents.  A preoperative Lexiscan Myoview study in July 2019 prior to his back surgery was low risk and showed a small inferior defect with EF 50%.  When I saw him in July 2021 he had developed nonexertional episodes of chest pressure which were short-lived and often improved with antacid therapy. Symptoms would typically last a minute or 2.  He was started on Pepcid with improvement.  At the time he was drinking over 6 cups of coffee per day which undoubtedly contributed to his dyspeptic symptomatology.  His Lexiscan Myoview study was unchanged from 2019 and showed mild severity fixed mid inferior perfusion defect most likely scar.  Nuclear EF was 43% but visually it appeared higher.  When I saw him in January 2023, he was experiencing some reflux symptomatology but at times  experienced some vague left arm sensation.  Due to sinus bradycardia with heart rate at 43 his atenolol dose was reduced from 25 twice a day to 25 mg in the morning and 12.5 mg at night.  Isosorbide 30 mg was added to his medical regimen and he continued to be on DAPT with aspirin/Plavix, and for hyperlipidemia atorvastatin 40 mg, Lovaza 2 capsules twice a day, and Zetia.  He was  diabetic on glimepiride, metformin in addition to pioglitazone.  He suffered a witnessed cardiac arrest at home on August 06, 2022 and CPR was started by his wife.  When EMS arrived, he required 6 shocks for VF before ROSC.  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   Although acute EF was 30 to 35%, One day later EF had improved to 45 to 50%.  He is on guideline directed medical therapy with Jardiance 10 mg, Entresto 49/51 mg twice a day, furosemide 20 mg daily in addition to continue DAPT with aspirin/Plavix.  When I saw him in November 2023 I added metoprolol succinate 12.5 mg with his resting pulse at 73.  Subsequent echo Doppler study from 05/30/2023 remarkably showed complete normalization of LV function with EF 60 to 65%.  There was mild concentric LVH and mild residual hypokinesis in the basal inferior and inferolateral wall.  Presently, his blood pressure is stable but on the low side.  He is not having chest pain or shortness of breath or leg swelling.  He currently is on DAPT with aspirin/Plavix, Jardiance 10 mg, Entresto 49/51 mg twice a day, furosemide 20 mg, and metoprolol 12.5 mg daily.  With his low blood pressure I suggested he may try reducing furosemide to 10 mg as long as there is no significant edema.  He continues to get his laboratory at the [facility] and most recent laboratory had shown total cholesterol 99, LDL 28, although triglycerides were elevated at 296.  He has continued to use CPAP with [facility].  His pressures were adjusted at my prior office visit due to continued events.  He tells me he just received a new mask from the [place] and is in the process of getting a new machine.  Previously, he was noted to have central events and I had discussed potential need for future BiPAP or ASV titration if these continued.  I discussed with him today my plans for  retirement in mid 2025.  I will see him in 6 months and after my retirement will transition him to the care of Dr. Bryan Lemma for follow-up Cardiologic care.    Lennette Bihari, MD, [facility], ABSM Diplomate, American Board of Sleep Medicine   10/05/2023 12:18 PM

## 2023-10-03 NOTE — Patient Instructions (Signed)
Medication Instructions:  The current medical regimen is effective;  continue present plan and medications as directed. Please refer to the Current Medication list given to you today.   *If you need a refill on your cardiac medications before your next appointment, please call your pharmacy*   Lab Work: No labs were ordered during today's visit.  If you have labs (blood work) drawn today and your tests are completely normal, you will receive your results only by: MyChart Message (if you have MyChart) OR A paper copy in the mail If you have any lab test that is abnormal or we need to change your treatment, we will call you to review the results.   Testing/Procedures: No procedures ordered today.    Follow-Up: At [facility], you and your health needs are our priority.  As part of our continuing mission to provide you with exceptional heart care, we have created designated Provider Care Teams.  These Care Teams include your primary Cardiologist (physician) and Advanced Practice Providers (APPs -  Physician Assistants and Nurse Practitioners) who all work together to provide you with the care you need, when you need it.  We recommend signing up for the patient portal called "MyChart".  Sign up information is provided on this After Visit Summary.  MyChart is used to connect with patients for Virtual Visits (Telemedicine).  Patients are able to view lab/test results, encounter notes, upcoming appointments, etc.  Non-urgent messages can be sent to your provider as well.   To learn more about what you can do with MyChart, go to [email or web address].    Your next appointment:   6 month(s)  Provider:   Nicki Guadalajara, MD    1 year Dr. Bryan Lemma  Other Instructions A letter will be mailed to you as a reminder to call the office for your follow up appointment.

## 2023-10-05 ENCOUNTER — Encounter: Payer: Self-pay | Admitting: Cardiovascular Disease

## 2023-10-15 ENCOUNTER — Other Ambulatory Visit: Payer: Self-pay | Admitting: Family Medicine

## 2023-10-15 DIAGNOSIS — E1169 Type 2 diabetes mellitus with other specified complication: Secondary | ICD-10-CM

## 2023-11-09 ENCOUNTER — Telehealth: Payer: Self-pay

## 2023-11-09 NOTE — Telephone Encounter (Signed)
Noted  

## 2023-11-09 NOTE — Telephone Encounter (Signed)
Received pt's med shipment of Ozempic 1 mg (4 boxes total).   Lvm asking pt to call back. Need to notify pt of above medication shipment.  [Placed Ozempic (4 boxes) in 2nd refrigerator.]

## 2023-11-09 NOTE — Telephone Encounter (Signed)
Patient arrived to pick up medication

## 2023-11-13 ENCOUNTER — Encounter: Payer: Self-pay | Admitting: Family Medicine

## 2023-11-13 ENCOUNTER — Ambulatory Visit (INDEPENDENT_AMBULATORY_CARE_PROVIDER_SITE_OTHER): Payer: PPO | Admitting: Family Medicine

## 2023-11-13 VITALS — BP 126/72 | HR 56 | Temp 97.9°F | Ht 68.0 in | Wt 232.4 lb

## 2023-11-13 DIAGNOSIS — Z7984 Long term (current) use of oral hypoglycemic drugs: Secondary | ICD-10-CM | POA: Diagnosis not present

## 2023-11-13 DIAGNOSIS — Z77098 Contact with and (suspected) exposure to other hazardous, chiefly nonmedicinal, chemicals: Secondary | ICD-10-CM

## 2023-11-13 DIAGNOSIS — E1121 Type 2 diabetes mellitus with diabetic nephropathy: Secondary | ICD-10-CM

## 2023-11-13 DIAGNOSIS — E1169 Type 2 diabetes mellitus with other specified complication: Secondary | ICD-10-CM | POA: Diagnosis not present

## 2023-11-13 LAB — POCT GLYCOSYLATED HEMOGLOBIN (HGB A1C): Hemoglobin A1C: 8.1 % — AB (ref 4.0–5.6)

## 2023-11-13 MED ORDER — METFORMIN HCL 1000 MG PO TABS: 1000.0000 mg | ORAL_TABLET | Freq: Two times a day (BID) | ORAL | 4 refills | Status: DC

## 2023-11-13 MED ORDER — GLIMEPIRIDE 2 MG PO TABS: 2.0000 mg | ORAL_TABLET | Freq: Every day | ORAL | 4 refills | Status: DC

## 2023-11-13 MED ORDER — GLIMEPIRIDE 2 MG PO TABS: 2.0000 mg | ORAL_TABLET | Freq: Every day | ORAL | 1 refills | Status: DC

## 2023-11-13 NOTE — Progress Notes (Signed)
Ph: [phone] Fax: [phone]   Patient ID: Evan Avery, male    DOB: June 26, 1946, 78 y.o.   MRN: [id number]  This visit was conducted in person.  BP 126/72   Pulse (!) 56   Temp 97.9 F (36.6 C) (Oral)   Ht 5\' 8"  (1.727 m)   Wt 232 lb 6 oz (105.4 kg)   SpO2 96%   BMI 35.33 kg/m    CC: 6 mo DM f/u Subjective:   HPI: Evan Avery is a 78 y.o. 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 Currently has 10% disability for tinnitus.   Brings [facility] labs which were reviewed - TC 99, HDL 25, LDL 28, trig 296, UA with 4+ glu, Cr 1.33. Recent reassuring lung cancer screen through the [facility] 09/10/2023 - brings record of this as well.   POAG severe right, moderate left - sees [facility] clinic on eye drops. Known h/o dry eyes. Unsure latest diabetic retinopathy screen.  Upcoming cataract surgery in [place] (Dr Bonnetta Barry at [facility]).   Saw cardiology for ischemic cardiomyopathy - stable period. Furosemide dose decreased to 1/2 tab10mg .   DM - does not regularly check sugars. Compliant with antihyperglycemic regimen which includes: jardiance 10mg  daily (through the [place]), amaryl 2mg  daily, metformin 1000mg  bid, and ozempic 1mg  weekly. He was out of ozempic for 3 months as it was unaffordable - just restarted yesterday. Was approved for Ozempic PAP for this year. Sending to our office. Denies low sugars or hypoglycemic symptoms. Denies paresthesias, blurry vision. Last diabetic eye exam 10/2022 - saw [facility] records requested. Glucometer brand: has at home, unknown brand. Last foot exam: 09/2022 - DUE. DSME: declined. Lab Results  Component Value Date   HGBA1C 8.1 (A) 11/13/2023   Diabetic Foot Exam - Simple   Simple Foot Form Diabetic Foot exam was performed with the following findings: Yes 11/13/2023  9:59 AM  Visual Inspection See comments: Yes Sensation Testing Intact to touch and  monofilament testing bilaterally: Yes Pulse Check Posterior Tibialis and Dorsalis pulse intact bilaterally: Yes Comments No claudication Slightly diminished to monofilament testing on left Dry skin throughout feet with thickened onychomycotic toenails    Lab Results  Component Value Date   MICROALBUR 3.6 (H) 06/13/2023         Relevant past medical, surgical, family and social history reviewed and updated as indicated. Interim medical history since our last visit reviewed. Allergies and medications reviewed and updated. Outpatient Medications Prior to Visit  Medication Sig Dispense Refill   aspirin EC 81 MG tablet Take 1 tablet (81 mg total) by mouth daily. Swallow whole. 90 tablet 3   atorvastatin (LIPITOR) 80 MG tablet Take 1 tablet (80 mg total) by mouth daily. 90 tablet 3   cetirizine (ZYRTEC) 10 MG tablet Take 1 tablet (10 mg total) by mouth daily. 90 tablet 3   clopidogrel (PLAVIX) 75 MG tablet Take 1 tablet by mouth once daily 90 tablet 1   Coenzyme Q10 (COQ10) 200 MG CAPS Take 200 mg by mouth daily.     cyanocobalamin (VITAMIN B12) 1000 MCG tablet Take 1 tablet (1,000 mcg total) by mouth daily.     ezetimibe (ZETIA) 10 MG tablet Take 1 tablet (10 mg total) by mouth daily. 90 tablet 3   fluticasone (FLONASE) 50 MCG/ACT nasal spray Place 2 sprays into both nostrils daily. 16 g 1   gabapentin (  NEURONTIN) 100 MG capsule Take 100 mg by mouth 3 (three) times daily.     metoprolol succinate (TOPROL-XL) 25 MG 24 hr tablet Take 1/2 (one-half) tablet by mouth once daily 45 tablet 3   Netarsudil-Latanoprost (ROCKLATAN) 0.02-0.005 % SOLN Apply 1 drop to eye at bedtime.     nitroGLYCERIN (NITROSTAT) 0.4 MG SL tablet For chest pain, tightness, or pressure. While sitting, place 1 tablet under tongue. May be used every 5 minutes as needed, for up to 15 minutes. Do not use more than 3 tablets. 25 tablet 3   omega-3 acid ethyl esters (LOVAZA) 1 g capsule Take 2 capsules by mouth twice daily 360  capsule 0   Semaglutide, 1 MG/DOSE, 4 MG/3ML SOPN Inject 1 mg as directed once a week. 3 mL 7   senna-docusate (SENOKOT-S) 8.6-50 MG tablet Take 1 tablet by mouth at bedtime as needed for mild constipation. 15 tablet 0   TURMERIC CURCUMIN PO Take 1 capsule by mouth daily.     empagliflozin (JARDIANCE) 10 MG TABS tablet Take 1 tablet (10 mg total) by mouth daily before breakfast. 90 tablet 3   furosemide (LASIX) 20 MG tablet Take 1 tablet (20 mg total) by mouth daily. 90 tablet 3   glimepiride (AMARYL) 2 MG tablet TAKE 1 TABLET BY MOUTH ONCE DAILY BEFORE BREAKFAST 90 tablet 1   metFORMIN (GLUCOPHAGE) 1000 MG tablet TAKE 1 TABLET BY MOUTH TWICE DAILY WITH MEALS 180 tablet 0   sacubitril-valsartan (ENTRESTO) 49-51 MG Take 1 tablet by mouth 2 (two) times daily. 180 tablet 3   empagliflozin (JARDIANCE) 10 MG TABS tablet Take 1 tablet (10 mg total) by mouth daily before breakfast.     furosemide (LASIX) 20 MG tablet Take 0.5 tablets (10 mg total) by mouth daily.     sacubitril-valsartan (ENTRESTO) 49-51 MG Take 1 tablet by mouth 2 (two) times daily.     No facility-administered medications prior to visit.     Per HPI unless specifically indicated in ROS section below Review of Systems  Objective:  BP 126/72   Pulse (!) 56   Temp 97.9 F (36.6 C) (Oral)   Ht 5\' 8"  (1.727 m)   Wt 232 lb 6 oz (105.4 kg)   SpO2 96%   BMI 35.33 kg/m   Wt Readings from Last 3 Encounters:  11/13/23 232 lb 6 oz (105.4 kg)  10/03/23 230 lb 6.4 oz (104.5 kg)  06/13/23 236 lb 12.8 oz (107.4 kg)      Physical Exam Vitals and nursing note reviewed.  Constitutional:      Appearance: Normal appearance. He is not ill-appearing.  HENT:     Mouth/Throat:     Mouth: Mucous membranes are dry.     Pharynx: Oropharynx is clear. No oropharyngeal exudate or posterior oropharyngeal erythema.  Eyes:     Extraocular Movements: Extraocular movements intact.     Conjunctiva/sclera: Conjunctivae normal.     Pupils: Pupils  are equal, round, and reactive to light.     Comments: Dry eyes present  Cardiovascular:     Rate and Rhythm: Normal rate and regular rhythm.     Pulses: Normal pulses.     Heart sounds: Normal heart sounds. No murmur heard. Pulmonary:     Effort: Pulmonary effort is normal. No respiratory distress.     Breath sounds: Normal breath sounds. No wheezing, rhonchi or rales.  Musculoskeletal:     Right lower leg: No edema.     Left lower leg: No edema.  Comments: See HPI for foot exam if done  Skin:    General: Skin is warm and dry.     Findings: No rash.  Neurological:     Mental Status: He is alert.  Psychiatric:        Mood and Affect: Mood normal.        Behavior: Behavior normal.       Results for orders placed or performed in visit on 11/13/23  POCT glycosylated hemoglobin (Hb A1C)   Collection Time: 11/13/23  9:43 AM  Result Value Ref Range   Hemoglobin A1C 8.1 (A) 4.0 - 5.6 %   HbA1c POC (<> result, manual entry)     HbA1c, POC (prediabetic range)     HbA1c, POC (controlled diabetic range)      Assessment & Plan:   Problem List Items Addressed This Visit     Type 2 diabetes mellitus with CAD - Primary (Chronic)   Chronic, overall adequate based on A1c 8.1% in setting of being off Ozempic for the past several months, recently restarted at 1mg  weekly. He will let me know if any trouble tolerating restarting at higher dose. Continue metformin jardiance and amaryl.       Relevant Medications   empagliflozin (JARDIANCE) 10 MG TABS tablet   metFORMIN (GLUCOPHAGE) 1000 MG tablet   glimepiride (AMARYL) 2 MG tablet   Agent orange exposure   Type 2 diabetes mellitus with diabetic nephropathy, without long-term current use of insulin (HCC)   Continues gabapentin 100mg  TID through [place].       Relevant Medications   empagliflozin (JARDIANCE) 10 MG TABS tablet   metFORMIN (GLUCOPHAGE) 1000 MG tablet   glimepiride (AMARYL) 2 MG tablet   Other Relevant Orders   POCT  glycosylated hemoglobin (Hb A1C) (Completed)     Meds ordered this encounter  Medications   DISCONTD: glimepiride (AMARYL) 2 MG tablet    Sig: Take 1 tablet (2 mg total) by mouth daily before breakfast.    Dispense:  90 tablet    Refill:  1   metFORMIN (GLUCOPHAGE) 1000 MG tablet    Sig: Take 1 tablet (1,000 mg total) by mouth 2 (two) times daily with a meal.    Dispense:  180 tablet    Refill:  4   glimepiride (AMARYL) 2 MG tablet    Sig: Take 1 tablet (2 mg total) by mouth daily before breakfast.    Dispense:  90 tablet    Refill:  4    Orders Placed This Encounter  Procedures   POCT glycosylated hemoglobin (Hb A1C)    Patient Instructions  Ask eye doctor for diabetic eye exam next time you see him.  A1c was down to 8.1% - restart ozempic let me know if any trouble tolerating 1mg  dose.  Good to see you today Return as needed or in 6 months for next physical   Follow up plan: Return in about 6 months (around 05/12/2024) for annual exam, prior fasting for blood work, medicare wellness visit.  Eustaquio Boyden, MD

## 2023-11-13 NOTE — Assessment & Plan Note (Signed)
Continues gabapentin 100mg  TID through [place].

## 2023-11-13 NOTE — Assessment & Plan Note (Signed)
Chronic, overall adequate based on A1c 8.1% in setting of being off Ozempic for the past several months, recently restarted at 1mg  weekly. He will let me know if any trouble tolerating restarting at higher dose. Continue metformin jardiance and amaryl.

## 2023-11-13 NOTE — Patient Instructions (Addendum)
Ask eye doctor for diabetic eye exam next time you see him.  A1c was down to 8.1% - restart ozempic let me know if any trouble tolerating 1mg  dose.  Good to see you today Return as needed or in 6 months for next physical

## 2024-01-04 ENCOUNTER — Other Ambulatory Visit: Payer: Self-pay | Admitting: Cardiology

## 2024-01-04 ENCOUNTER — Other Ambulatory Visit: Payer: Self-pay | Admitting: Family Medicine

## 2024-01-04 ENCOUNTER — Other Ambulatory Visit: Payer: Self-pay | Admitting: Cardiovascular Disease

## 2024-01-04 DIAGNOSIS — E1169 Type 2 diabetes mellitus with other specified complication: Secondary | ICD-10-CM

## 2024-01-07 ENCOUNTER — Telehealth: Payer: Self-pay

## 2024-01-07 NOTE — Telephone Encounter (Signed)
 Noted.

## 2024-01-07 NOTE — Telephone Encounter (Signed)
 Patient came by and picked up medication.

## 2024-01-07 NOTE — Telephone Encounter (Signed)
 Received pt's med shipment of Ozempic 1 mg (4 boxes total).    Spoke with pt notifying him of shipment above. Pt expresses his thanks.    [Placed Ozempic (4 boxes) in 2nd refrigerator.]

## 2024-01-30 ENCOUNTER — Encounter: Payer: Self-pay | Admitting: Gastroenterology

## 2024-02-12 ENCOUNTER — Ambulatory Visit (INDEPENDENT_AMBULATORY_CARE_PROVIDER_SITE_OTHER): Payer: No Typology Code available for payment source

## 2024-02-12 VITALS — Ht 68.0 in | Wt 232.0 lb

## 2024-02-12 DIAGNOSIS — Z0000 Encounter for general adult medical examination without abnormal findings: Secondary | ICD-10-CM

## 2024-02-12 NOTE — Progress Notes (Signed)
 Subjective:   CASMERE HOLLENBECK is a 78 y.o. who presents for a Medicare Wellness preventive visit.  Visit Complete: Virtual I connected with  Braden Caddy on 02/12/24 by a audio enabled telemedicine application and verified that I am speaking with the correct person using two identifiers.  Patient Location: Home  Provider Location: Office/Clinic  I discussed the limitations of evaluation and management by telemedicine. The patient expressed understanding and agreed to proceed.  Vital Signs: Because this visit was a virtual/telehealth visit, some criteria may be missing or patient reported. Any vitals not documented were not able to be obtained and vitals that have been documented are patient reported.  VideoDeclined- This patient declined Librarian, academic. Therefore the visit was completed with audio only.  Persons Participating in Visit: Patient.  AWV Questionnaire: No: Patient Medicare AWV questionnaire was not completed prior to this visit.  Cardiac Risk Factors include: advanced age (>33men, >63 women);diabetes mellitus;dyslipidemia;male gender;hypertension;obesity (BMI >30kg/m2);sedentary lifestyle     Objective:    Today's Vitals   02/12/24 1425  Weight: 232 lb (105.2 kg)  Height: 5\' 8"  (1.727 m)   Body mass index is 35.28 kg/m.     02/12/2024    2:33 PM 02/07/2023    2:46 PM 08/08/2022    6:00 PM 11/11/2020    6:39 AM 01/02/2020    9:45 AM 12/27/2018   12:51 PM 05/22/2018    7:38 AM  Advanced Directives  Does Patient Have a Medical Advance Directive? No No No No No No No  Would patient like information on creating a medical advance directive?  No - Patient declined No - Patient declined No - Patient declined No - Patient declined No - Patient declined No - Patient declined    Current Medications (verified) Outpatient Encounter Medications as of 02/12/2024  Medication Sig   aspirin  EC 81 MG tablet Take 1 tablet (81 mg total) by mouth  daily. Swallow whole.   atorvastatin  (LIPITOR ) 80 MG tablet Take 1 tablet by mouth once daily   cetirizine  (ZYRTEC ) 10 MG tablet Take 1 tablet (10 mg total) by mouth daily.   clopidogrel  (PLAVIX ) 75 MG tablet Take 1 tablet by mouth once daily   Coenzyme Q10 (COQ10) 200 MG CAPS Take 200 mg by mouth daily.   cyanocobalamin  (VITAMIN B12) 1000 MCG tablet Take 1 tablet (1,000 mcg total) by mouth daily.   empagliflozin  (JARDIANCE ) 10 MG TABS tablet Take 1 tablet (10 mg total) by mouth daily before breakfast.   ezetimibe  (ZETIA ) 10 MG tablet Take 1 tablet by mouth once daily   fluticasone  (FLONASE ) 50 MCG/ACT nasal spray Place 2 sprays into both nostrils daily.   furosemide  (LASIX ) 20 MG tablet Take 1 tablet by mouth once daily   gabapentin  (NEURONTIN ) 100 MG capsule Take 100 mg by mouth 3 (three) times daily.   glimepiride  (AMARYL ) 2 MG tablet Take 1 tablet (2 mg total) by mouth daily before breakfast.   metFORMIN  (GLUCOPHAGE ) 1000 MG tablet Take 1 tablet (1,000 mg total) by mouth 2 (two) times daily with a meal.   metoprolol  succinate (TOPROL -XL) 25 MG 24 hr tablet Take 1/2 (one-half) tablet by mouth once daily   Netarsudil-Latanoprost  (ROCKLATAN) 0.02-0.005 % SOLN Apply 1 drop to eye at bedtime.   nitroGLYCERIN  (NITROSTAT ) 0.4 MG SL tablet For chest pain, tightness, or pressure. While sitting, place 1 tablet under tongue. May be used every 5 minutes as needed, for up to 15 minutes. Do not use more than  3 tablets.   omega-3 acid ethyl esters (LOVAZA ) 1 g capsule Take 2 capsules by mouth twice daily   sacubitril -valsartan  (ENTRESTO ) 49-51 MG Take 1 tablet by mouth 2 (two) times daily.   Semaglutide , 1 MG/DOSE, 4 MG/3ML SOPN Inject 1 mg as directed once a week.   senna-docusate (SENOKOT-S) 8.6-50 MG tablet Take 1 tablet by mouth at bedtime as needed for mild constipation.   TURMERIC CURCUMIN PO Take 1 capsule by mouth daily.   No facility-administered encounter medications on file as of 02/12/2024.     Allergies (verified) Dilaudid  [hydromorphone  hcl], Ambien  [zolpidem ], and Niacin and related   History: Past Medical History:  Diagnosis Date   Agent orange exposure 1968   Arthritis    CAD (coronary artery disease)    4 stents in right artery   Complex tear of medial meniscus of left knee as current injury 07/26/2012   [place]   Diabetes mellitus    type 2   Diverticulitis of colon with perforation 05/23/2016   DVT (deep venous thrombosis) (HCC)    GERD (gastroesophageal reflux disease)    Glaucoma 2006   [facility]   Hx of colonic polyps    Hyperlipidemia    Hypertension    Intra-abdominal abscess (HCC) 05/16/2016   LUMBAR RADICULOPATHY 07/18/2007   Qualifier: Diagnosis of  By: Larrie Po MD, John E    Myocardial infarction [facility])    1992   Sleep apnea    uses CPAP regularly   Past Surgical History:  Procedure Laterality Date   APPENDECTOMY  1958   BOWEL RESECTION N/A 05/23/2016   Procedure: SMALL BOWEL RESECTION;  Surgeon: Adalberto Hollow, MD;  Location: [facility];  Service: General;  Laterality: N/A;   CARDIAC CATHETERIZATION  08/11/2008   angiographically patent LAD diagonal, circumflex and multiple circumflex obtuse marginal branches from the L coronary artery; stenting of mid R coronary artery with 3.5 x 28mm Liberte non DES postdilated w/ 4.0 DuraStent Loetta Ringer)   CARDIOVASCULAR STRESS TEST  06/16/2011   R/P MV - moderate perfusion defect in basal inferoseptal, basal inferior, mid inferseptal and mid inferior regions, consistent with infarct/scar and/or diaphragmatic attenuation; non-gated study secondary to ectopy; no CP or EKG changes for ischemia; abnormal study although no significant changes from previous study; in the absense of gated images cannot calculate EF or distinguish scar/artifact   COLONOSCOPY  08/2016   TAx5, sessile serrated polyp, diverticulosis, rpt 2 yrs ([place])   COLONOSCOPY  10/2020   1.5cm SSP, HP, rpt [facility])   COLONOSCOPY  08/2020    multiple polpys removed (TA, HP), 1.5cm polyp biopsied (SSP), colon lipoma [facility]o)   COLONOSCOPY WITH PROPOFOL  N/A 11/11/2020   Procedure: COLONOSCOPY WITH PROPOFOL ;  Surgeon: Janel Medford, MD;  [facility];  Service: Endoscopy;  Laterality: N/A;   COLOSTOMY Left 05/23/2016   Procedure: COLOSTOMY;  Surgeon: Adalberto Hollow, MD;  [facility];  Service: General;  Laterality: Left;   COLOSTOMY TAKEDOWN N/A 11/10/2016   Procedure: LAPAROSCOPIC LYSIS OF ADHESIONS AND  COLOSTOMY CLOSURE;  Surgeon: Adalberto Hollow, MD;  [facility];  Service: General;  Laterality: N/A;   CORONARY/GRAFT ACUTE MI REVASCULARIZATION N/A 08/06/2022   Procedure: Coronary/Graft Acute MI Revascularization;  Surgeon: Arty Binning, MD;  [facility];  Service: Cardiovascular;  Laterality: N/A;   CYST EXCISION  04/2018   posterior neck ([place])   DOPPLER ECHOCARDIOGRAPHY  08/20/2008   EF >55%; LV systolic function normal; LV mildly dilated; doppler flow  suggestive of impaired LV relaxation; RV mildly dilated, RV systolic function normal, RV systolic pressure normal   ESOPHAGOGASTRODUODENOSCOPY  08/2020   gastritis, duodenitis likely NSAID/ASA related, H pylori neg - rec stop aspirin  [facility] Macho)   INCISION AND DRAINAGE ABSCESS N/A 05/23/2016   Procedure: DRAINAGE OF MULTIPLE INTRA  ABDOMINAL ABSCESS;  Surgeon: Adalberto Hollow, MD;  [facility];  Service: General;  Laterality: N/A;   KNEE SURGERY Left 2015   torn meniscus Gilberto Labella)   LAPAROSCOPIC LYSIS OF ADHESIONS  11/10/2016   Procedure: LAPAROSCOPIC LYSIS OF ADHESIONS;  Surgeon: Adalberto Hollow, MD;  [facility];  Service: General;;   LAPAROTOMY N/A 05/23/2016   Procedure: EXPLORATORY LAPAROTOMY;  Surgeon: Adalberto Hollow, MD;  [facility];  Service: General;  Laterality: N/A;   LEFT HEART CATH AND CORONARY ANGIOGRAPHY N/A 08/06/2022   Procedure: LEFT HEART CATH AND CORONARY ANGIOGRAPHY;  Surgeon: Arty Binning, MD;   [facility];  Service: Cardiovascular;  Laterality: N/A;   MASS EXCISION N/A 05/22/2018   Procedure: EXCISION OF CYST ON POSTERIOR NECK ERAS PATHWAY;  Surgeon: Sim Dryer, MD;  Location: [facility];  Service: General;  Laterality: N/A;   PERCUTANEOUS CORONARY STENT INTERVENTION (PCI-S)  1992, 1999, 2004   4 vessels Loetta Ringer)   POLYPECTOMY  11/11/2020   Procedure: POLYPECTOMY;  Surgeon: Janel Medford, MD;  [facility];  Service: Endoscopy;;   PROCTOSCOPY  11/10/2016   Procedure: PROCTOSCOPY;  Surgeon: Adalberto Hollow, MD;  [facility];  Service: General;;   SUBMUCOSAL LIFTING INJECTION  11/11/2020   Procedure: SUBMUCOSAL LIFTING INJECTION;  Surgeon: Janel Medford, MD;  Location: [facility];  Service: Endoscopy;;   Family History  Problem Relation Age of Onset   Alzheimer's disease Mother    Heart disease Father    Heart attack Brother    Diabetes Brother    Heart disease Brother    Heart disease Maternal Grandmother    Colon cancer Maternal Grandfather    Diabetes Paternal Grandfather    Diabetes Son    Esophageal cancer Neg Hx    Rectal cancer Neg Hx    Stomach cancer Neg Hx    Social History   Socioeconomic History   Marital status: Married    Spouse name: Monroe Antigua   Number of children: 2   Years of education: Not on file   Highest education level: GED or equivalent  Occupational History   Occupation: retired  Tobacco Use   Smoking status: Former    Current packs/day: 0.00    Average packs/day: 1 pack/day for 63.8 years (63.8 ttl pk-yrs)    Types: Cigarettes    Start date: 10/23/1958    Quit date: 08/05/2022    Years since quitting: 1.5   Smokeless tobacco: Never  Vaping Use   Vaping status: Never Used  Substance and Sexual Activity   Alcohol use: Yes    Alcohol/week: 0.0 standard drinks of alcohol    Comment: rarely-1 drink every 2-3 months   Drug use: No   Sexual activity: Yes  Other Topics Concern   Not on  file  Social History Narrative   Lives with wife and son (with mental issues)   Occupation: retired, was Tax inspector for [employer]   Activity: no regular exercise   Diet: good water , fruits/vegetables daily   Social Drivers of Corporate investment banker Strain: Low Risk  (02/12/2024)   Overall Financial Resource Strain (CARDIA)    Difficulty of Paying  Living Expenses: Not hard at all  Food Insecurity: No Food Insecurity (02/12/2024)   Hunger Vital Sign    Worried About Running Out of Food in the Last Year: Never true    Ran Out of Food in the Last Year: Never true  Transportation Needs: No Transportation Needs (02/12/2024)   PRAPARE - Administrator, Civil Service (Medical): No    Lack of Transportation (Non-Medical): No  Physical Activity: Inactive (02/12/2024)   Exercise Vital Sign    Days of Exercise per Week: 0 days    Minutes of Exercise per Session: 0 min  Stress: No Stress Concern Present (02/12/2024)   [employer] of Occupational Health - Occupational Stress Questionnaire    Feeling of Stress : Not at all  Social Connections: Moderately Integrated (02/12/2024)   Social Connection and Isolation Panel [NHANES]    Frequency of Communication with Friends and Family: Three times a week    Frequency of Social Gatherings with Friends and Family: Twice a week    Attends Religious Services: More than 4 times per year    Active Member of [employer] or Organizations: No    Attends Engineer, structural: Never    Marital Status: Married    Tobacco Counseling Counseling given: Not Answered    Clinical Intake:  Pre-visit preparation completed: Yes  Pain : No/denies pain     BMI - recorded: 35.28 Nutritional Status: BMI > 30  Obese Nutritional Risks: None Diabetes: Yes CBG done?: No Did pt. bring in CBG monitor from home?: No  Lab Results  Component Value Date   HGBA1C 8.1 (A) 11/13/2023   HGBA1C 9.1 (H) 06/13/2023   HGBA1C 10.1 (A) 02/05/2023      How often do you need to have someone help you when you read instructions, pamphlets, or other written materials from your doctor or pharmacy?: 1 - Never  Interpreter Needed?: No  Comments: lives with wife and son Information entered by :: B.Delana Manganello,LPN   Activities of Daily Living     02/12/2024    2:33 PM  In your present state of health, do you have any difficulty performing the following activities:  Hearing? 0  Vision? 0  Difficulty concentrating or making decisions? 0  Walking or climbing stairs? 0  Dressing or bathing? 0  Doing errands, shopping? 0  Preparing Food and eating ? N  Using the Toilet? N  In the past six months, have you accidently leaked urine? N  Do you have problems with loss of bowel control? N  Managing your Medications? N  Managing your Finances? N  Housekeeping or managing your Housekeeping? N    Patient Care Team: Claire Crick, MD as PCP - General (Family Medicine) Millicent Ally, MD as PCP - Cardiology (Cardiology) Millicent Ally, MD as Consulting Physician (Cardiology) Adalberto Hollow, MD as Consulting Physician (General Surgery) Jonathan Neighbor, [facility] (Inactive) as Pharmacist (Pharmacist) [facility] [place]  Indicate any recent Medical Services you may have received from other than [facility] providers in the past year (date may be approximate).     Assessment:   This is a routine wellness examination for [place].  Hearing/Vision screen Hearing Screening - Comments:: Pt says his hearing is good Vision Screening - Comments:: Pt says his vision is good just needs 2.50 readers Dr Lydia [facility]    Goals Addressed             This Visit's Progress    DIET - INCREASE WATER  INTAKE  02/12/24-, I will continue to drink at least 6-8 glasses of water  daily.      Patient Stated   On track    02/12/24-, I will maintain and continue medications as prescribed.      Patient Stated       02/12/24-Lose weight       Depression Screen      02/12/2024    2:29 PM 11/13/2023   10:06 AM 06/13/2023    9:47 AM 02/07/2023    2:51 PM 02/05/2023   10:48 AM 06/06/2022    9:32 AM 01/10/2021    8:02 AM  PHQ 2/9 Scores  PHQ - 2 Score 0 0 0 0 0 0 0  PHQ- 9 Score   2        Fall Risk     02/12/2024    2:27 PM 11/13/2023   10:06 AM 06/13/2023    9:47 AM 02/07/2023    2:40 PM 02/05/2023   10:47 AM  Fall Risk   Falls in the past year? 0 0 0 1 1  Number falls in past yr: 0  0 0 0  Comment    pt had heart attack in 10/23 fell back on floor   Injury with Fall? 0  0 1 1  Comment    pt had heart attack in Oct 2023   Risk for fall due to : No Fall Risks  No Fall Risks    Follow up Falls prevention discussed;Education provided  Falls evaluation completed      MEDICARE RISK AT HOME:  Medicare Risk at Home Any stairs in or around the home?: Yes If so, are there any without handrails?: Yes Home free of loose throw rugs in walkways, pet beds, electrical cords, etc?: Yes Adequate lighting in your home to reduce risk of falls?: Yes Life alert?: No Use of a cane, walker or w/c?: No Grab bars in the bathroom?: No Shower chair or bench in shower?: No Elevated toilet seat or a handicapped toilet?: No  TIMED UP AND GO:  Was the test performed?  No  Cognitive Function: 6CIT completed    01/02/2020    9:49 AM 12/27/2018    1:51 PM 12/14/2017    9:36 AM 12/06/2016    8:17 AM  MMSE - Mini Mental State Exam  Orientation to time 5 5 5 5   Orientation to Place 5 5 5 5   Registration 3 3 3 3   Attention/ Calculation 5 0 0 0  Recall 3 3 3 3   Language- name 2 objects  0 0 0  Language- repeat 1 1 1 1   Language- follow 3 step command  3 3 3   Language- read & follow direction  0 0 0  Write a sentence  0 0 0  Copy design  0 0 0  Total score  20 20 20         02/12/2024    2:36 PM 02/07/2023    2:50 PM  6CIT Screen  What Year? 0 points 0 points  What month? 0 points 0 points  What time? 0 points 0 points  Count back from 20 0 points 0 points   Months in reverse 0 points 0 points  Repeat phrase 0 points 0 points  Total Score 0 points 0 points    Immunizations Immunization History  Administered Date(s) Administered   Fluad Quad(high Dose 65+) 07/29/2019, 08/26/2021, 08/30/2022   Influenza Split 08/02/2012   Influenza, High Dose Seasonal PF 07/07/2020, 08/24/2023  Influenza, Quadrivalent, Recombinant, Inj, Pf 07/07/2020   Influenza,inj,Quad PF,6+ Mos 09/17/2013, 09/07/2014, 09/06/2016, 11/07/2017   Influenza-Unspecified 10/01/2009, 07/23/2010, 09/01/2011, 09/28/2015, 08/24/2019, 08/23/2021   Novel Infuenza-h1n1-09 08/23/2008   PFIZER(Purple Top)SARS-COV-2 Vaccination 12/15/2019, 01/05/2020   Pneumococcal Conjugate-13 10/23/2013, 03/31/2014   Pneumococcal Polysaccharide-23 04/13/2015   Pneumococcal-Unspecified 09/24/2008, 10/23/2012   Respiratory Syncytial Virus Vaccine,Recomb Aduvanted(Arexvy) 02/28/2023   Rsv, Bivalent, Protein Subunit Rsvpref,pf (Abrysvo) 02/28/2023   Td 10/23/2000, 10/11/2021   Td (Adult),unspecified 07/13/1999   Tdap 04/05/2012, 10/23/2012   Zoster Recombinant(Shingrix) 10/11/2021, 02/28/2023    Screening Tests Health Maintenance  Topic Date Due   COVID-19 Vaccine (3 - Pfizer risk series) 02/02/2020   Lung Cancer Screening  04/07/2020   Colonoscopy  11/11/2022   OPHTHALMOLOGY EXAM  10/25/2023   Medicare Annual Wellness (AWV)  02/07/2024   HEMOGLOBIN A1C  05/12/2024   INFLUENZA VACCINE  05/23/2024   Diabetic kidney evaluation - eGFR measurement  08/26/2024   Diabetic kidney evaluation - Urine ACR  08/26/2024   FOOT EXAM  11/12/2024   DTaP/Tdap/Td (5 - Td or Tdap) 10/12/2031   Pneumonia Vaccine 69+ Years old  Completed   Hepatitis C Screening  Completed   Zoster Vaccines- Shingrix  Completed   HPV VACCINES  Aged Out   Meningococcal B Vaccine  Aged Out    Health Maintenance  Health Maintenance Due  Topic Date Due   COVID-19 Vaccine (3 - Pfizer risk series) 02/02/2020   Lung Cancer  Screening  04/07/2020   Colonoscopy  11/11/2022   OPHTHALMOLOGY EXAM  10/25/2023   Medicare Annual Wellness (AWV)  02/07/2024   Health Maintenance Items Addressed: Noone needed: pt gets care ([facility]) from [place]  Additional Screening:  Vision Screening: Recommended annual ophthalmology exams for early detection of glaucoma and other disorders of the eye.  Dental Screening: Recommended annual dental exams for proper oral hygiene  Community Resource Referral / Chronic Care Management: CRR required this visit?  No   CCM required this visit?  No     Plan:     I have personally reviewed and noted the following in the patient's chart:   Medical and social history Use of alcohol, tobacco or illicit drugs  Current medications and supplements including opioid prescriptions. Patient is not currently taking opioid prescriptions. Functional ability and status Nutritional status Physical activity Advanced directives List of other physicians Hospitalizations, surgeries, and ER visits in previous 12 months Vitals Screenings to include cognitive, depression, and falls Referrals and appointments  In addition, I have reviewed and discussed with patient certain preventive protocols, quality metrics, and best practice recommendations. A written personalized care plan for preventive services as well as general preventive health recommendations were provided to patient.     Nerissa Bannister, LPN   1/61/0960   After Visit Summary: (Declined) Due to this being a telephonic visit, with patients personalized plan was offered to patient but patient Declined AVS at this time   Notes: Nothing significant to report at this time.

## 2024-02-12 NOTE — Patient Instructions (Signed)
 Mr. Evan Avery , Thank you for taking time to come for your Medicare Wellness Visit. I appreciate your ongoing commitment to your health goals. Please review the following plan we discussed and let me know if I can assist you in the future.   Referrals/Orders/Follow-Ups/Clinician Recommendations: none  This is a list of the screening recommended for you and due dates:  Health Maintenance  Topic Date Due   COVID-19 Vaccine (3 - Pfizer risk series) 02/02/2020   Screening for Lung Cancer  04/07/2020   Colon Cancer Screening  11/11/2022   Eye exam for diabetics  10/25/2023   Medicare Annual Wellness Visit  02/07/2024   Hemoglobin A1C  05/12/2024   Flu Shot  05/23/2024   Yearly kidney function blood test for diabetes  08/26/2024   Yearly kidney health urinalysis for diabetes  08/26/2024   Complete foot exam   11/12/2024   DTaP/Tdap/Td vaccine (5 - Td or Tdap) 10/12/2031   Pneumonia Vaccine  Completed   Hepatitis C Screening  Completed   Zoster (Shingles) Vaccine  Completed   HPV Vaccine  Aged Out   Meningitis B Vaccine  Aged Out    Advanced directives: (Declined) Advance directive discussed with you today. Even though you declined this today, please call our office should you change your mind, and we can give you the proper paperwork for you to fill out.  Next Medicare Annual Wellness Visit scheduled for next year: Yes 02/12/25 @ 2:20pm televisit

## 2024-02-28 ENCOUNTER — Ambulatory Visit (INDEPENDENT_AMBULATORY_CARE_PROVIDER_SITE_OTHER): Admitting: Gastroenterology

## 2024-02-28 ENCOUNTER — Encounter: Payer: Self-pay | Admitting: Gastroenterology

## 2024-02-28 VITALS — BP 126/84 | HR 74 | Ht 68.0 in | Wt 221.5 lb

## 2024-02-28 DIAGNOSIS — I2510 Atherosclerotic heart disease of native coronary artery without angina pectoris: Secondary | ICD-10-CM

## 2024-02-28 DIAGNOSIS — G4733 Obstructive sleep apnea (adult) (pediatric): Secondary | ICD-10-CM

## 2024-02-28 DIAGNOSIS — Z87891 Personal history of nicotine dependence: Secondary | ICD-10-CM

## 2024-02-28 DIAGNOSIS — Z8674 Personal history of sudden cardiac arrest: Secondary | ICD-10-CM

## 2024-02-28 DIAGNOSIS — I25110 Atherosclerotic heart disease of native coronary artery with unstable angina pectoris: Secondary | ICD-10-CM

## 2024-02-28 DIAGNOSIS — I469 Cardiac arrest, cause unspecified: Secondary | ICD-10-CM

## 2024-02-28 DIAGNOSIS — Z860100 Personal history of colon polyps, unspecified: Secondary | ICD-10-CM | POA: Diagnosis not present

## 2024-02-28 DIAGNOSIS — J449 Chronic obstructive pulmonary disease, unspecified: Secondary | ICD-10-CM | POA: Diagnosis not present

## 2024-02-28 DIAGNOSIS — J432 Centrilobular emphysema: Secondary | ICD-10-CM

## 2024-02-28 DIAGNOSIS — Z09 Encounter for follow-up examination after completed treatment for conditions other than malignant neoplasm: Secondary | ICD-10-CM

## 2024-02-28 NOTE — Progress Notes (Signed)
 Chief Complaint: Colonoscopy recall Primary GI MD: Dr. Howard Macho  HPI: 78 year old male history of CAD s/p 4 stents on aspirin  and Plavix , CHF, sessile serrated polyp, presents for colonoscopy recall  Previous colon history Complicated diverticulitis requiring sigmoid colocolonic anastomosis remotely.   Elevated risk for colon cancer. 11/22/ 2017 colonoscopy with Dr. Kimble Pennant with " too many" 3- 8 mm polyps in the rectum, diverticulosis in the sigmoid, descending and transverse colon, 2 2- 3 mm polyps in the descending colon and transverse colon, a few 5- 6 mm polyps in the descending colon and transverse colon and a nodule in the cecum which is a possible mucocele or possible pancreatic rest. Pathology showed sessile serrated polyp, tubular adenomas, polypoid colonic mucosa negative for dysplasia and hyperplastic polyps.   November 2021 Dr. Howard Macho colonoscopy: 4 subcentimeter adenomas removed from the descending, transverse and cecum segments. There was a soft approximately 15 mm centrally umbilicated " nodule" in the cecum with excessive mucus associated. He felt this was similar although likely larger than the 8 mm lesion described by Dr. Kimble Pennant at his 2017 colonoscopy, biopsied. This was proven to be a sessile serrated polyp. There were also several soft fleshy small to medium sized sessile polyps in the rectosigmoid which were near the colocolonic anastomosis and then distal. Those were sampled and were proven to be hyperplastic  Colonoscopy 2022 with colon polyp at cecum completely removed and biopsies showed sessile serrated polyp (1.5cm) and multiple other hyperplastic polyps and a repeat of 3 years (10/2023)  Discussed the use of AI scribe software for clinical note transcription with the patient, who gave verbal consent to proceed.  History of Present Illness in 2023, he experienced a witnessed cardiac arrest and is currently under the care of a cardiologist. He is on aspirin  and Plavix ,  which he has been taking for a long time. He also uses a CPAP machine at night. His heart's ejection fraction is reported to be good.  No current gastrointestinal symptoms such as changes in bowel habits, rectal bleeding, or unexplained weight loss. He is on Ozempic , which can cause changes in bowel habits, and has experienced some weight loss, which he attributes to the medication. He recalls a past incident in 2017 where he had severe constipation leading to abscesses and required surgery. Miralax  did not help him during that time, but he is considering using it again to maintain regular bowel movements.    PREVIOUS GI WORKUP   Colonoscopy 10/2020 - The previously noted mucous capped, centrally umbilicated 1.5cm cecal polyp was easliy located. I used a submucosal injection of Orise to life the polyp without concerning signs and then resected the polyp with snare/ cautery, apparently completly  - The left colon, unusual appearing colo- colonic anstomosis was unchanged. Jar 1.  - Distal to the anastomosis there were several soft fleshy polyps ( 10- 15) The polyps were 3 to 15 mm in size. I removed the largest 5 of these polyps with cold snare and sent to pathology. Jar 2.  Colonoscopy 08/2020 for history of colon polyps - Four sessile polyps were found in the descending colon, transverse colon and cecum. The polyps were 2 to 3 mm in size. These polyps were removed with a cold snare. Resection and retrieval were complete. jar 1  - There was a soft, 15mm, centrally umbilicated ' nodule' in the cecum with excessive mucous. This was similar although likely larger than the 8mm lesion described in 2017 Dr. Kimble Pennant colonoscopy and I biopsied it  with forceps. jar 2  - There were 15- 20 soft, fleshy sessile polyps in the rectosigmoid ( near the colo- colonic anastomosis and then distal) . These were sampled with forceps. jar 3.  - Normal colo- colonic anastomosis about 15cm from the anus.  - Small, soft  typical appearing descending colon lipoma.  - The examination was otherwise normal on direct and retroflexion views.  EGD 08/2020 for epigastric pain and heartburn - Significant gastritis, duodenitis. This is very suspicious for NSAID/ ASA related damage, however biopsies were taken from the stomach to check for H. pylori.  - The examination was otherwise normal.  Past Medical History:  Diagnosis Date   Agent orange exposure 1968   Arthritis    CAD (coronary artery disease)    4 stents in right artery   Complex tear of medial meniscus of left knee as current injury 07/26/2012   [place]   Diabetes mellitus    type 2   Diverticulitis of colon with perforation 05/23/2016   DVT (deep venous thrombosis) (HCC)    GERD (gastroesophageal reflux disease)    Glaucoma 2006   on drops, [facility]   Hx of colonic polyps    Hyperlipidemia    Hypertension    Intra-abdominal abscess (HCC) 05/16/2016   LUMBAR RADICULOPATHY 07/18/2007   Qualifier: Diagnosis of  By: Larrie Po MD, John E    Myocardial infarction [facility])    1992   Sleep apnea    uses CPAP regularly    Past Surgical History:  Procedure Laterality Date   APPENDECTOMY  1958   BOWEL RESECTION N/A 05/23/2016   Procedure: SMALL BOWEL RESECTION;  Surgeon: Adalberto Hollow, MD;  Location: [facility];  Service: General;  Laterality: N/A;   CARDIAC CATHETERIZATION  08/11/2008   angiographically patent LAD diagonal, circumflex and multiple circumflex obtuse marginal branches from the L coronary artery; stenting of mid R coronary artery with 3.5 x 28mm Liberte non DES postdilated w/ 4.0 DuraStent Loetta Ringer)   CARDIOVASCULAR STRESS TEST  06/16/2011   R/P MV - moderate perfusion defect in basal inferoseptal, basal inferior, mid inferseptal and mid inferior regions, consistent with infarct/scar and/or diaphragmatic attenuation; non-gated study secondary to ectopy; no CP or EKG changes for ischemia; abnormal study although no significant changes from previous  study; in the absense of gated images cannot calculate EF or distinguish scar/artifact   COLONOSCOPY  08/2016   TAx5, sessile serrated polyp, diverticulosis, rpt 2 yrs ([place])   COLONOSCOPY  10/2020   1.5cm SSP, HP, rpt Howard Macho)   COLONOSCOPY  08/2020   multiple polpys removed (TA, HP), 1.5cm polyp biopsied (SSP), colon lipoma Howard Macho)   COLONOSCOPY WITH PROPOFOL  N/A 11/11/2020   Procedure: COLONOSCOPY WITH PROPOFOL ;  Surgeon: Janel Medford, MD;  Location: [facility];  Service: Endoscopy;  Laterality: N/A;   COLOSTOMY Left 05/23/2016   Procedure: COLOSTOMY;  Surgeon: Adalberto Hollow, MD;  Location: [facility];  Service: General;  Laterality: Left;   COLOSTOMY TAKEDOWN N/A 11/10/2016   Procedure: LAPAROSCOPIC LYSIS OF ADHESIONS AND  COLOSTOMY CLOSURE;  Surgeon: Adalberto Hollow, MD;  Location: [facility];  Service: General;  Laterality: N/A;   CORONARY/GRAFT ACUTE MI REVASCULARIZATION N/A 08/06/2022   Procedure: Coronary/Graft Acute MI Revascularization;  Surgeon: Arty Binning, MD;  Location: [facility];  Service: Cardiovascular;  Laterality: N/A;   CYST EXCISION  04/2018   posterior neck ([place])   DOPPLER ECHOCARDIOGRAPHY  08/20/2008   EF >55%; LV systolic function normal; LV mildly dilated; doppler flow  suggestive of impaired LV relaxation; RV mildly dilated, RV systolic function normal, RV systolic pressure normal   ESOPHAGOGASTRODUODENOSCOPY  08/2020   gastritis, duodenitis likely NSAID/ASA related, H pylori neg - rec stop aspirin  Howard Macho)   INCISION AND DRAINAGE ABSCESS N/A 05/23/2016   Procedure: DRAINAGE OF MULTIPLE INTRA  ABDOMINAL ABSCESS;  Surgeon: Adalberto Hollow, MD;  Location: [facility];  Service: General;  Laterality: N/A;   KNEE SURGERY Left 2015   torn meniscus Gilberto Labella)   LAPAROSCOPIC LYSIS OF ADHESIONS  11/10/2016   Procedure: LAPAROSCOPIC LYSIS OF ADHESIONS;  Surgeon: Adalberto Hollow, MD;  Location: [facility];  Service: General;;   LAPAROTOMY N/A 05/23/2016   Procedure:  EXPLORATORY LAPAROTOMY;  Surgeon: Adalberto Hollow, MD;  Location: [facility];  Service: General;  Laterality: N/A;   LEFT HEART CATH AND CORONARY ANGIOGRAPHY N/A 08/06/2022   Procedure: LEFT HEART CATH AND CORONARY ANGIOGRAPHY;  Surgeon: Arty Binning, MD;  Location: [facility];  Service: Cardiovascular;  Laterality: N/A;   MASS EXCISION N/A 05/22/2018   Procedure: EXCISION OF CYST ON POSTERIOR NECK ERAS PATHWAY;  Surgeon: Sim Dryer, MD;  Location:  SURGERY CENTER;  Service: General;  Laterality: N/A;   PERCUTANEOUS CORONARY STENT INTERVENTION (PCI-S)  1992, 1999, 2004   4 vessels Loetta Ringer)   POLYPECTOMY  11/11/2020   Procedure: POLYPECTOMY;  Surgeon: Janel Medford, MD;  [facility];  Service: Endoscopy;;   PROCTOSCOPY  11/10/2016   Procedure: PROCTOSCOPY;  Surgeon: Adalberto Hollow, MD;  [facility]: [facility];  Service: General;;   SUBMUCOSAL LIFTING INJECTION  11/11/2020   Procedure: SUBMUCOSAL LIFTING INJECTION;  Surgeon: Janel Medford, MD;  Location: [facility];  Service: Endoscopy;;    Current Outpatient Medications  Medication Sig Dispense Refill   aspirin  EC 81 MG tablet Take 1 tablet (81 mg total) by mouth daily. Swallow whole. 90 tablet 3   atorvastatin  (LIPITOR ) 80 MG tablet Take 1 tablet by mouth once daily 90 tablet 1   cetirizine  (ZYRTEC ) 10 MG tablet Take 1 tablet (10 mg total) by mouth daily. 90 tablet 3   clopidogrel  (PLAVIX ) 75 MG tablet Take 1 tablet by mouth once daily 90 tablet 1   Coenzyme Q10 (COQ10) 200 MG CAPS Take 200 mg by mouth daily.     cyanocobalamin  (VITAMIN B12) 1000 MCG tablet Take 1 tablet (1,000 mcg total) by mouth daily.     empagliflozin  (JARDIANCE ) 10 MG TABS tablet Take 1 tablet (10 mg total) by mouth daily before breakfast.     ezetimibe  (ZETIA ) 10 MG tablet Take 1 tablet by mouth once daily 90 tablet 1   fluticasone  (FLONASE ) 50 MCG/ACT nasal spray Place 2 sprays into both nostrils daily. 16 g 1   furosemide  (LASIX ) 20 MG  tablet Take 1 tablet by mouth once daily 90 tablet 1   gabapentin  (NEURONTIN ) 100 MG capsule Take 100 mg by mouth 3 (three) times daily.     glimepiride  (AMARYL ) 2 MG tablet Take 1 tablet (2 mg total) by mouth daily before breakfast. 90 tablet 4   metFORMIN  (GLUCOPHAGE ) 1000 MG tablet Take 1 tablet (1,000 mg total) by mouth 2 (two) times daily with a meal. 180 tablet 4   metoprolol  succinate (TOPROL -XL) 25 MG 24 hr tablet Take 1/2 (one-half) tablet by mouth once daily 45 tablet 3   Netarsudil-Latanoprost  (ROCKLATAN) 0.02-0.005 % SOLN Apply 1 drop to eye at bedtime.     nitroGLYCERIN  (NITROSTAT ) 0.4 MG SL tablet For chest pain, tightness, or pressure. While  sitting, place 1 tablet under tongue. May be used every 5 minutes as needed, for up to 15 minutes. Do not use more than 3 tablets. 25 tablet 3   omega-3 acid ethyl esters (LOVAZA ) 1 g capsule Take 2 capsules by mouth twice daily 360 capsule 1   sacubitril -valsartan  (ENTRESTO ) 49-51 MG Take 1 tablet by mouth 2 (two) times daily.     Semaglutide , 1 MG/DOSE, 4 MG/3ML SOPN Inject 1 mg as directed once a week. 3 mL 7   senna-docusate (SENOKOT-S) 8.6-50 MG tablet Take 1 tablet by mouth at bedtime as needed for mild constipation. 15 tablet 0   TURMERIC CURCUMIN PO Take 1 capsule by mouth daily.     No current facility-administered medications for this visit.    Allergies as of 02/28/2024 - Review Complete 02/28/2024  Allergen Reaction Noted   Dilaudid  [hydromorphone  hcl] Itching 11/07/2016   Ambien  [zolpidem ] Other (See Comments) 11/10/2016   Niacin and related Other (See Comments) 04/13/2013    Family History  Problem Relation Age of Onset   Alzheimer's disease Mother    Heart disease Father    Heart attack Brother    Diabetes Brother    Heart disease Brother    Heart disease Maternal Grandmother    Colon cancer Maternal Grandfather    Diabetes Paternal Grandfather    Diabetes Son    Esophageal cancer Neg Hx    Rectal cancer Neg Hx     Stomach cancer Neg Hx     Social History   Socioeconomic History   Marital status: Married    Spouse name: Monroe Antigua   Number of children: 2   Years of education: Not on file   Highest education level: GED or equivalent  Occupational History   Occupation: retired  Tobacco Use   Smoking status: Former    Current packs/day: 0.00    Average packs/day: 1 pack/day for 63.8 years (63.8 ttl pk-yrs)    Types: Cigarettes    Start date: 10/23/1958    Quit date: 08/05/2022    Years since quitting: 1.5   Smokeless tobacco: Never  Vaping Use   Vaping status: Never Used  Substance and Sexual Activity   Alcohol use: Yes    Alcohol/week: 0.0 standard drinks of alcohol    Comment: rarely-1 drink every 2-3 months   Drug use: No   Sexual activity: Yes  Other Topics Concern   Not on file  Social History Narrative   Lives with wife and son (with mental issues)   Occupation: retired, was Tax inspector for [employer]   Activity: no regular exercise   Diet: good water , fruits/vegetables daily   Social Drivers of Corporate investment banker Strain: Low Risk  (02/12/2024)   Overall Financial Resource Strain (CARDIA)    Difficulty of Paying Living Expenses: Not hard at all  Food Insecurity: No Food Insecurity (02/12/2024)   Hunger Vital Sign    Worried About Running Out of Food in the Last Year: Never true    Ran Out of Food in the Last Year: Never true  Transportation Needs: No Transportation Needs (02/12/2024)   PRAPARE - Administrator, Civil Service (Medical): No    Lack of Transportation (Non-Medical): No  Physical Activity: Inactive (02/12/2024)   Exercise Vital Sign    Days of Exercise per Week: 0 days    Minutes of Exercise per Session: 0 min  Stress: No Stress Concern Present (02/12/2024)   [employer] of Occupational Health - Occupational  Stress Questionnaire    Feeling of Stress : Not at all  Social Connections: Moderately Integrated (02/12/2024)   Social Connection  and Isolation Panel [NHANES]    Frequency of Communication with Friends and Family: Three times a week    Frequency of Social Gatherings with Friends and Family: Twice a week    Attends Religious Services: More than 4 times per year    Active Member of [employer] or Organizations: No    Attends Banker Meetings: Never    Marital Status: Married  Catering manager Violence: Not At Risk (02/12/2024)   Humiliation, Afraid, Rape, and Kick questionnaire    Fear of Current or Ex-Partner: No    Emotionally Abused: No    Physically Abused: No    Sexually Abused: No    Review of Systems:    Constitutional: No weight loss, fever, chills, weakness or fatigue HEENT: Eyes: No change in vision               Ears, Nose, Throat:  No change in hearing or congestion Skin: No rash or itching Cardiovascular: No chest pain, chest pressure or palpitations   Respiratory: No SOB or cough Gastrointestinal: See HPI and otherwise negative Genitourinary: No dysuria or change in urinary frequency Neurological: No headache, dizziness or syncope Musculoskeletal: No new muscle or joint pain Hematologic: No bleeding or bruising Psychiatric: No history of depression or anxiety    Physical Exam:  Vital signs: BP 126/84   Pulse 74   Ht 5\' 8"  (1.727 m)   Wt 221 lb 8 oz (100.5 kg)   BMI 33.68 kg/m   Constitutional: NAD, alert and cooperative Head:  Normocephalic and atraumatic. Eyes:   PEERL, EOMI. No icterus. Conjunctiva pink. Respiratory: Respirations even and unlabored. Lungs clear to auscultation bilaterally.   No wheezes, crackles, or rhonchi.  Cardiovascular:  Regular rate and rhythm. No peripheral edema, cyanosis or pallor.  Gastrointestinal:  Soft, nondistended, nontender. No rebound or guarding. Normal bowel sounds. No appreciable masses or hepatomegaly. Rectal:  Declines Msk:  Symmetrical without gross deformities. Without edema, no deformity or joint abnormality.  Neurologic:  Alert and   oriented x4;  grossly normal neurologically.  Skin:   Dry and intact without significant lesions or rashes. Psychiatric: Oriented to person, place and time. Demonstrates good judgement and reason without abnormal affect or behaviors.  RELEVANT LABS AND IMAGING: CBC    Component Value Date/Time   WBC 5.2 08/27/2023 0000   WBC 6.6 06/13/2023 1031   RBC 4.30 06/13/2023 1031   HGB 14.5 08/27/2023 0000   HCT 40.7 06/13/2023 1031   PLT 141 (A) 08/27/2023 0000   MCV 94.6 06/13/2023 1031   MCH 32.7 08/23/2022 1036   MCHC 33.9 06/13/2023 1031   RDW 14.4 06/13/2023 1031   LYMPHSABS 1.3 06/13/2023 1031   MONOABS 0.4 06/13/2023 1031   EOSABS 0.3 06/13/2023 1031   BASOSABS 0.1 06/13/2023 1031    CMP     Component Value Date/Time   NA 140 08/27/2023 0000   K 4.6 08/27/2023 0000   CL 103 06/13/2023 1029   CO2 26 06/13/2023 1029   GLUCOSE 186 (H) 06/13/2023 1029   BUN 23 06/13/2023 1029   BUN 16 08/16/2022 1055   CREATININE 1.0 08/27/2023 0000   CREATININE 1.43 06/13/2023 1029   CREATININE 0.96 03/13/2016 0803   CALCIUM  9.6 06/13/2023 1029   PROT 6.8 06/13/2023 1029   ALBUMIN  4.9 08/27/2023 0000   AST 14 08/27/2023 0000   ALT  20 08/27/2023 0000   ALKPHOS 78 08/27/2023 0000   BILITOT 0.6 06/13/2023 1029   GFRNONAA 59 (L) 08/29/2022 1143   GFRAA >60 05/15/2018 1527     Assessment/Plan:    History of colon polyps Colonoscopy 2022 with colon polyp at cecum completely removed and biopsies showed sessile serrated polyp (1.5cm) and multiple other hyperplastic polyps and a repeat of 3 years (10/2023) Extensive discussion with patient about risks vs benefits of colonoscopy in the setting of his heart history. Discussed need for cardiac clearance and likelihood his procedure would be done at the hospital. Patient stated prior to this appt he was very hesitant to undergo repeat with his heart history and he doesn't feel comfortable pursing. Discussed if symptoms arise, we can revisit this  discussion and do a colonoscopy for diagnostic purpose if necessary with cardiac clearance but will hold off on surveillance procedure for now. -- hold off on surveillance colonoscopy per patient request -- if symptoms such as change in bowel habits, weight loss, rectal bleeding occur please let us  know and consider diagnostic colonoscopy to be done (will need cardiac clearance)  CAD Most recent stent placement 2009.  On dual antiplatelet therapy with aspirin  and Plavix .  Witnessed cardiac arrest October 2023 with emergent catheterization with stent placement.  EF 60 to 65% May 2024  OSA on CPAP Cardiology shows suboptimal results and they are considered normal if he continues to have events to change to BiPAP versus ASV  COPD No on oxygen   Nydia Ytuarte [facility] 02/28/2024, 4:01 PM  Cc: Claire Crick, MD

## 2024-02-28 NOTE — Progress Notes (Signed)
 Agree with assessment and plan as outlined.  Can certainly understand the patient's hesitancy about going under anesthesia/colonoscopy, as long as he understands risks benefits of each option defer to his preference.

## 2024-02-28 NOTE — Patient Instructions (Signed)
 Please follow up sooner if symptoms increase or worsen  Due to recent changes in healthcare laws, you may see the results of your imaging and laboratory studies on MyChart before your provider has had a chance to review them.  We understand that in some cases there may be results that are confusing or concerning to you. Not all laboratory results come back in the same time frame and the provider may be waiting for multiple results in order to interpret others.  Please give us  48 hours in order for your provider to thoroughly review all the results before contacting the office for clarification of your results.   _______________________________________________________  If your blood pressure at your visit was 140/90 or greater, please contact your primary care physician to follow up on this.  _______________________________________________________  If you are age 14 or older, your body mass index should be between 23-30. Your Body mass index is 33.68 kg/m. If this is out of the aforementioned range listed, please consider follow up with your Primary Care Provider.  If you are age 53 or younger, your body mass index should be between 19-25. Your Body mass index is 33.68 kg/m. If this is out of the aformentioned range listed, please consider follow up with your Primary Care Provider.   ________________________________________________________  The [facility] providers would like to encourage you to use MYCHART to communicate with providers for non-urgent requests or questions.  Due to long hold times on the telephone, sending your provider a message by [facility] may be a faster and more efficient way to get a response.  Please allow 48 business hours for a response.  Please remember that this is for non-urgent requests.  _______________________________________________________ Thank you for trusting me with your gastrointestinal care!   Suzanna Erp, PA

## 2024-03-07 ENCOUNTER — Encounter: Payer: Self-pay | Admitting: Family Medicine

## 2024-04-09 ENCOUNTER — Telehealth: Payer: Self-pay

## 2024-04-09 NOTE — Telephone Encounter (Signed)
 Received pt's med shipment of Ozempic  1 mg (4 boxes total).    Lvm asking pt to call back. Need to notify him of shipment above.     [Placed Ozempic  (4 boxes) in 2nd refrigerator.]

## 2024-04-10 NOTE — Telephone Encounter (Signed)
 Received pt's med shipment of Ozempic  1 mg (4 boxes total).    Lvm asking pt to call back. Need to notify him of shipment above.     [Placed Ozempic  (4 boxes) in 2nd refrigerator.]

## 2024-04-11 ENCOUNTER — Other Ambulatory Visit: Payer: Self-pay | Admitting: Family Medicine

## 2024-04-11 NOTE — Telephone Encounter (Signed)
Picked up medication.

## 2024-04-11 NOTE — Telephone Encounter (Unsigned)
 Copied from CRM [id number]. Topic: General - Other >> Apr 11, 2024  9:27 AM Hassie Lint wrote: Reason for CRM: Patient advised he got vm advising his medication is in office for pickup. He will either come today or Monday to pick up

## 2024-04-11 NOTE — Telephone Encounter (Signed)
 Plavix  Last filled:  10/30/23, #90 Last OV:  11/13/23, DM f/u Next OV:  06/16/24, CPE

## 2024-04-11 NOTE — Telephone Encounter (Signed)
 Noted

## 2024-04-16 ENCOUNTER — Ambulatory Visit: Attending: Cardiovascular Disease | Admitting: Cardiovascular Disease

## 2024-04-16 VITALS — BP 128/76 | HR 63 | Ht 68.0 in | Wt 224.6 lb

## 2024-04-16 DIAGNOSIS — I10 Essential (primary) hypertension: Secondary | ICD-10-CM | POA: Diagnosis not present

## 2024-04-16 DIAGNOSIS — I25110 Atherosclerotic heart disease of native coronary artery with unstable angina pectoris: Secondary | ICD-10-CM | POA: Diagnosis not present

## 2024-04-16 DIAGNOSIS — G4733 Obstructive sleep apnea (adult) (pediatric): Secondary | ICD-10-CM

## 2024-04-16 DIAGNOSIS — I255 Ischemic cardiomyopathy: Secondary | ICD-10-CM

## 2024-04-16 DIAGNOSIS — E118 Type 2 diabetes mellitus with unspecified complications: Secondary | ICD-10-CM

## 2024-04-16 DIAGNOSIS — Z720 Tobacco use: Secondary | ICD-10-CM

## 2024-04-16 DIAGNOSIS — E785 Hyperlipidemia, unspecified: Secondary | ICD-10-CM

## 2024-04-16 NOTE — Progress Notes (Signed)
 Patient ID: Evan Avery, male   DOB: 1946-07-22, 78 y.o.   MRN: [id number]      Primary M.D.: Dr. Rilla  HPI: Evan Avery is a 78 y.o. male who presents today for a 6 month cardiology evaluation.  Mr. Evan Avery has known CAD and has a total of 4 stents in his RCA. In 1992, he underwent initial PTCA of his RCA. Subsequently, he underwent stenting of the proximal and mid RCA.  In 1999 his fourth stent was placed in the mid RCA between the 2 previously placed stents and was a 3.5x28 mm non-DES Liberty stent. His last nuclear perfusion study in August 2012 showed inferior thinning without significant ischemia.  EF was not calculated secondary to  ectopy but previously had been 52%.  Additional problems include hypertension, hyperlipidemia, obesity, and obstructive sleep apnea for which he is he uses CPAP 100% of the time and uses  [facility] for his [employer].  Since I last saw him in May 2017.  He denied any recurrent episodes of chest pain or significant dyspnea.  He had a new CPAP machine and continue to be compliant.  He established primary care with Dr. Rilla.  Review of laboratory that he had with him in December 2016 has shown a cholesterol of 149, HDL 27, but his triglycerides were 413.  Remotely he had triglyceride elevations as high as 584.  In December, his hemoglobin A1c was 7.7.  He underwent surgery in July 2017 due to an abscess in his colon.  He required a colostomy for 6 months and in January 2018 underwent colostomy closure.   I saw him in October 2018 after not having seen him in over 17 months.  Since that evaluation, he has undergone 2 resections for cyst on the back of his neck with recurrent infections.  He recently saw [name] consideration of surgical excision.  He has felt well with reference to chest pain or shortness of breath.  He had smoked for many years but quit smoking for 10 years but unfortunately resumed smoking in June 2018 and currently  smoking 1 pack/day.  He has not had a nuclear perfusion study since 2012.  He continues to use CPAP with 100% compliance.  He was  started on Januvia  for his diabetes mellitus by his primary physician.    When I saw hime in June 2019  he was in need of back and neck surgery.  I recommended he undergo a Lexiscan  Myoview  study for preoperative assessment.  This was done on April 23, 2018 which showed a small inferior defect, low risk.  EF was 50% with basal inferior hypokinesis.  He tolerated his neck and back surgery well.  Is only he is without anginal symptoms.  He has a documented umbilical/ventral  hernia and has been without abdominal discomfort.  He continues to use CPAP with 100% compliance.  He received a new CPAP machine at the [facility] is a ResMed AirSense 10.  He gets his supplies from the [facility].    I saw him in February 2020 and  saw him in January 2021.  He denied any chest pain  with walking but does admit to some mild shortness of breath.  Unfortunately he still smokes 1 pack of cigarettes per day.  He had experienced some throat burning which had improved with pantoprazole .  He has an umbilical hernia and ventral hernia but denies any pain.  He continues to be on aspirin  and Plavix  for  DAPT.  He is on atorvastatin  40mg ,  Zetia  10 mg and Lovaza  2 capsules twice daily for mixed hyperlipidemia.  He continues to be on lisinopril  5 mg and atenolol  50 mg in the morning and 25 mg in the evening for hypertension.  There are no palpitations.  He is diabetic on glimepiride  and Metformin .  He was bradycardic but was entirely asymptomatic.  I saw him in July 2021 at that time he stated that he had experienced  several episodes of chest heaviness.  His episodes were short-lived times he would take Tums with benefit.  Unfortunately he still smokes cigarettes.  He has been drinking an excessive amount of coffee typically at least 6 cups/day.  There was some dyspepsia and he recently started taking Pepcid  with  some improvement.  He apparently is need to undergo endoscopy to be done at the [facility]. during that evaluation, I again discussed the importance of smoking cessation.  With his longstanding history of CAD, and recent chest discomfort I recommended he undergo 2-year follow-up Lexiscan  Myoview  for further evaluation.    He underwent a Lexiscan  Myoview  study on May 11, 2020.  There was evidence for small size mild fixed mid inferior perfusion defect most likely scar.  There was no associated ischemia.  EF was 43% with basal to mid inferior hypokinesis.  There was no change from the prior study in 2019.  I saw him on June 21, 2020 at which time he felt improved.  He denied any recurrent chest pain or exertional symptoms.  He was trying to significantly reduce his tobacco which previously had been 1 to 2 packs/day and most recently was 1 pack/day.  During that evaluation, his ECG showed marked sinus bradycardia at 43 bpm.  I recommended he reduce his a atenolol  to 25 mg twice a day.  I saw him on December 20, 2020 and since his prior evaluation he had remained cardiovascularly stable.    He has issues with GERD and is now on Pepcid  40 mg twice daily followed by Dr. Teressa of GI.  He continues to use CPAP which is now followed at the [facility].  Unfortunately he is still smoking 1 pack/day.  He will be undergoing repeat laboratory in 2 weeks with Dr. Rilla.  He denied any recurrent chest pain PND orthopnea, palpitations, presyncope or syncope.  I saw him on 2021/11/14.  Unfortunately his son died at 17 in 02-16-2021.  He had significant health related issues prior to his death.  Evan Avery denies any chest pain.  He is still smoking approximately 1 pack/day.  He continues to use CPAP 100% compliance.  He continues to have issues with GERD and is on therapy.  His last stress test was done in July 2021 which showed a nuclear EF at 43%.  There was evidence for fixed mid inferior perfusion defect most likely scar.   At times he does note a vague chest sensation which he feels may be his GERD, but at times he did experience some left arm radiation.  He denies presyncope or syncope.  He is unaware of palpitations.  At times he has experienced some mild ankle.  His ECG showed sinus bradycardia at 43 bpm and I recommended he reduce atenolol  from 25 mg twice a day to 25 mg in the morning and 12.5 mg at night.  I also empirically added isosorbide  30 mg to his medical regimen to provide potential anti-ischemic benefit.  On August 06, 2022 he suffered a  witnessed cardiac arrest at home.  His wife was there and a bystander CPR was initiated.  EMS arrived and he received 6 shocks for VF before ROSC.  He was intubated.  He underwent cardiac catheterization by Dr. Claudene and was found to have 99% mid RCA stenosis just above the previously placed bare-metal stent.  There were left-to-right collaterals.  The RCA was heavily calcified with the proximal segment having 50% stenosis.  He underwent PCI with overlapping synergy DES 3.5 x 28 mm stent inserted postdilated to 3.75 mm.  The 99% lesion was reduced to 0 but there was persistent underexpansion of approximately 50% in the more proximal segment.  Acute EF was estimated at 30 to 35% with severe hypokinesis to akinesis in the inferior wall basal to apical.  On August 07, 2022 echo Doppler study showed EF now at 45 to 50% with inferior hypokinesis.  There was mild biatrial dilation, mild mitral annular calcification as well as mild aortic calcification with aortic sclerosis without stenosis.  He was ultimately discharged on August 10, 2022 on aspirin /Plavix , Jardiance  10 mg, Entresto  24/26 mL twice a day, furosemide  20 mg to take as needed in addition to atorvastatin  80 mg, Zetia  10 mg and Lovaza  2 capsules twice a day, metformin  and glimepiride  for diabetes, and Neurontin .  He was evaluated on August 23, 2022 in the transition of care clinic heart failure pharmacy encounter and  samples were provided.  I saw him on September 07, 2022 at which time he felt well and denied any recurrent chest pain or shortness of breath.  He denied any PND, orthopnea, presyncope or syncope and was unaware of palpitations.  Was continuing to use CPAP prescribed at the [facility] where he gets his supplies.  However he has not had any follow-up assessment of his AHI result.    I saw him on November 28, 2022. Since I saw him continues to feel well and has been without recurrent chest pain or shortness of breath.  He is unaware of palpitations.  He continues to be on aspirin /Plavix  for DAPT.  He is on atorvastatin  80 mg, Zetia  10 mg Lovaza  2 g twice a day for mixed hyperlipidemia.  With reference to his used LV dysfunction he is on Jardiance  10 mg, Entresto  49/51 mg twice a day, furosemide  20 mg daily and metoprolol  succinate 12.5 mg.  He is diabetic on metformin .  He has continued to use CPAP.  Apparently we were able to obtain data from his CPAP machine.  He tells me he has never seen a doctor regarding his pressures.  He is compliant with excellent use averaging 9 hours and 4 minutes per night.  His pressure is set at a range of 5 to 12 cm.  AHI is elevated at 12.4 with an apnea index of 10.9 and central apnea index of 2.7.  He does have a mask leak.  His 95th percentile pressure is 11.1 with maximum average pressure 11.8.   When I saw him on April 13, 2023 he was continuing to do well and on his current medical regimen and denied any recent chest pain or significant shortness of breath.  He is unaware of any recent palpitations.  An echo Doppler study done on Feb 27, 2023 shows complete recovery of global LV function with a EF at 60 to 65%.  He has mild LVH and mild hypokinesis of the basal inferior to inferolateral wall.  He has mild aortic sclerosis without stenosis.  EF has significantly improved  from prior postcardiac arrest study.  He has continued to use CPAP which supposedly is followed at the [facility] but he has  not had an evaluation recently.  His device is also linked to our office.  He has a ResMed air sense 10 AutoSet unit which apparently has been set at a pressure range of 7 to 14 cm.  At times he sleeps okay but other times he does not feel like his sleep is good.  He has nocturia 2-3 times per night.  On his download that we were able to obtain today from May 21 through April 11, 2023 he has 100% usage compliance with average use at 9 hours per night.  AHI is significantly elevated at 18.2 and he does have some central events with a central apnea index of 7.4.  Slight adjustments were made and his pressure range was changed to 9 to 16 cm of water .  [facility] and if continued central events were present a new BiPAP or possible ASV titration may be necessary.  He has recently been started on Ozempic  for the past several weeks.    I last saw him on October 03, 2023.  He had received a new mask from the [facility] and apparently is about to receive a new device for his obstructive sleep apnea.  I am not certain if this is a BiPAP or just a CPAP upgrade.  He continues to be remarkably stable from a cardiac standpoint and denies any chest pain or shortness of breath.  He continues to be on DAPT with aspirin /Plavix .  As noted on echo, LV function had improved and normalized on Jardiance  10 mg, Entresto  49/51 mg twice a day, furosemide  20 mg, and metoprolol  succinate 12.5 mg daily.  He is diabetic on metformin  and glimepiride .  He is on semaglutide  weekly injection.  He is on Zetia  10 mg, atorvastatin  80 mg, and Lovaza  2 capsules twice a day.    Since I last saw him, he received a new ResMed AirSense 11 AutoSet unit from the [facility] on October 08, 2023.  Compliance is monitored at the [facility] but I was able to obtain a download.  Usage is excellent with 100% usage days and average use at 8 hours and 47 minutes.  Apparently his machine is set at a pressure range of 11 to 17 cm.  AHI is elevated at 14.  He has  significant mask leak on a daily basis and undoubtedly will need a new mask.  [facility].  Cardiac standpoint, he has not had any further chest pain.  He admits to easy bruisability.  He continues to be on DAPT with aspirin  and Plavix .  He is on Entresto  49/51 mg twice daily, furosemide  20 mg, Jardiance  10 mg, and metoprolol  succinate daily.  He is on atorvastatin  80 mg, Zetia  10 mg and takes Lovaza  2 capsules twice a day.  He is on semaglutide  weekly injection.  He presents for evaluation.  Past Medical History:  Diagnosis Date   Agent orange exposure 1968   Arthritis    CAD (coronary artery disease)    4 stents in right artery   Complex tear of medial meniscus of left knee as current injury 07/26/2012   [place]   Diabetes mellitus    type 2   Diverticulitis of colon with perforation 05/23/2016   DVT (deep venous thrombosis) (HCC)    GERD (gastroesophageal reflux disease)    Glaucoma 2006  [facility] drops[facility]   Hx of colonic polyps    Hyperlipidemia    Hypertension    Intra-abdominal abscess (HCC) 05/16/2016   LUMBAR RADICULOPATHY 07/18/2007   Qualifier: Diagnosis of  By: Mavis MD, Norleen BRAVO    Myocardial infarction [facility])    1992   Sleep apnea    uses CPAP regularly    Past Surgical History:  Procedure Laterality Date   APPENDECTOMY  1958   BOWEL RESECTION N/A 05/23/2016   Procedure: SMALL BOWEL RESECTION;  Surgeon: Krystal Russell, MD;  Location: [facility];  Service: General;  Laterality: N/A;   CARDIAC CATHETERIZATION  08/11/2008   angiographically patent LAD diagonal, circumflex and multiple circumflex obtuse marginal branches from the L coronary artery; stenting of mid R coronary artery with 3.5 x 28mm Liberte non DES postdilated w/ 4.0 DuraStent [place])   CARDIOVASCULAR STRESS TEST  06/16/2011   R/P MV - moderate perfusion defect in basal inferoseptal, basal inferior, mid inferseptal and mid inferior regions, consistent with infarct/scar and/or  diaphragmatic attenuation; non-gated study secondary to ectopy; no CP or EKG changes for ischemia; abnormal study although no significant changes from previous study; in the absense of gated images cannot calculate EF or distinguish scar/artifact   COLONOSCOPY  08/2016   TAx5, sessile serrated polyp, diverticulosis, rpt 2 yrs ([place])   COLONOSCOPY  10/2020   1.5cm SSP, HP, rpt [place])   COLONOSCOPY  08/2020   multiple polpys removed (TA, HP), 1.5cm polyp biopsied (SSP), colon lipoma [place])   COLONOSCOPY WITH PROPOFOL  N/A 11/11/2020   Procedure: COLONOSCOPY WITH PROPOFOL ;  Surgeon: Teressa Toribio SQUIBB, MD;  Location: [facility];  Service: Endoscopy;  Laterality: N/A;   COLOSTOMY Left 05/23/2016   Procedure: COLOSTOMY;  Surgeon: Krystal Russell, MD;  Location: [facility];  Service: General;  Laterality: Left;   COLOSTOMY TAKEDOWN N/A 11/10/2016   Procedure: LAPAROSCOPIC LYSIS OF ADHESIONS AND  COLOSTOMY CLOSURE;  Surgeon: Krystal Russell, MD;  Location: [facility];  Service: General;  Laterality: N/A;   CORONARY/GRAFT ACUTE MI REVASCULARIZATION N/A 08/06/2022   Procedure: Coronary/Graft Acute MI Revascularization;  Surgeon: Claudene Victory ORN, MD;  Location: [facility];  Service: Cardiovascular;  Laterality: N/A;   CYST EXCISION  04/2018   posterior neck ([place])   DOPPLER ECHOCARDIOGRAPHY  08/20/2008   EF >55%; LV systolic function normal; LV mildly dilated; doppler flow suggestive of impaired LV relaxation; RV mildly dilated, RV systolic function normal, RV systolic pressure normal   ESOPHAGOGASTRODUODENOSCOPY  08/2020   gastritis, duodenitis likely NSAID/ASA related, H pylori neg - rec stop aspirin  [place])   INCISION AND DRAINAGE ABSCESS N/A 05/23/2016   Procedure: DRAINAGE OF MULTIPLE INTRA  ABDOMINAL ABSCESS;  Surgeon: Krystal Russell, MD;  Location: [facility];  Service: General;  Laterality: N/A;   KNEE SURGERY Left 2015   torn meniscus [facility])   LAPAROSCOPIC LYSIS OF ADHESIONS   11/10/2016   Procedure: LAPAROSCOPIC LYSIS OF ADHESIONS;  Surgeon: Krystal Russell, MD;  Location: [facility];  Service: General;;   LAPAROTOMY N/A 05/23/2016   Procedure: EXPLORATORY LAPAROTOMY;  Surgeon: Krystal Russell, MD;  Location: [facility];  Service: General;  Laterality: N/A;   LEFT HEART CATH AND CORONARY ANGIOGRAPHY N/A 08/06/2022   Procedure: LEFT HEART CATH AND CORONARY ANGIOGRAPHY;  Surgeon: Claudene Victory ORN, MD;  Location[facility];  Service: Cardiovascular;  Laterality: N/A;   MASS EXCISION N/A 05/22/2018   Procedure: EXCISION OF CYST ON POSTERIOR NECK ERAS PATHWAY;  Surgeon: Vanderbilt Ned, MD;  Location: [facility];  Service: General;  Laterality: N/A;   PERCUTANEOUS CORONARY STENT INTERVENTION (PCI-S)  1992, 1999, 2004   4 vessels [place])   POLYPECTOMY  11/11/2020   Procedure: POLYPECTOMY;  Surgeon: Teressa Toribio SQUIBB, MD;  [facility];  Service: Endoscopy;;   PROCTOSCOPY  11/10/2016   Procedure: PROCTOSCOPY;  Surgeon: Krystal Russell, MD;  [facility];  Service: General;;   SUBMUCOSAL LIFTING INJECTION  11/11/2020   Procedure: SUBMUCOSAL LIFTING INJECTION;  Surgeon: Teressa Toribio SQUIBB, MD;  Location: [facility];  Service: Endoscopy;;    Allergies  Allergen Reactions   Dilaudid  [Hydromorphone  Hcl] Itching   Ambien  [Zolpidem ] Other (See Comments)    Pt states makes me wild   Niacin And Related Other (See Comments)    Extreme fatigue    Current Outpatient Medications  Medication Sig Dispense Refill   atorvastatin  (LIPITOR ) 80 MG tablet Take 1 tablet by mouth once daily 90 tablet 1   cetirizine  (ZYRTEC ) 10 MG tablet Take 1 tablet (10 mg total) by mouth daily. 90 tablet 3   clopidogrel  (PLAVIX ) 75 MG tablet Take 1 tablet by mouth once daily 90 tablet 0   Coenzyme Q10 (COQ10) 200 MG CAPS Take 200 mg by mouth daily.     cyanocobalamin  (VITAMIN B12) 1000 MCG tablet Take 1 tablet (1,000 mcg total) by mouth daily.     empagliflozin  (JARDIANCE ) 10  MG TABS tablet Take 1 tablet (10 mg total) by mouth daily before breakfast.     ezetimibe  (ZETIA ) 10 MG tablet Take 1 tablet by mouth once daily 90 tablet 1   fluticasone  (FLONASE ) 50 MCG/ACT nasal spray Place 2 sprays into both nostrils daily. 16 g 1   furosemide  (LASIX ) 20 MG tablet Take 1 tablet by mouth once daily (Patient taking differently: Take 20 mg by mouth daily. TAKING 10 MG) 90 tablet 1   gabapentin  (NEURONTIN ) 100 MG capsule Take 100 mg by mouth 3 (three) times daily.     glimepiride  (AMARYL ) 2 MG tablet Take 1 tablet (2 mg total) by mouth daily before breakfast. 90 tablet 4   ketotifen (ZADITOR) 0.035 % ophthalmic solution Apply 2 drops to eye daily.     metFORMIN  (GLUCOPHAGE ) 1000 MG tablet Take 1 tablet (1,000 mg total) by mouth 2 (two) times daily with a meal. 180 tablet 4   metoprolol  succinate (TOPROL -XL) 25 MG 24 hr tablet Take 1/2 (one-half) tablet by mouth once daily 45 tablet 3   Netarsudil-Latanoprost  (ROCKLATAN) 0.02-0.005 % SOLN Apply 1 drop to eye at bedtime.     nitroGLYCERIN  (NITROSTAT ) 0.4 MG SL tablet For chest pain, tightness, or pressure. While sitting, place 1 tablet under tongue. May be used every 5 minutes as needed, for up to 15 minutes. Do not use more than 3 tablets. 25 tablet 3   omega-3 acid ethyl esters (LOVAZA ) 1 g capsule Take 2 capsules by mouth twice daily 360 capsule 1   sacubitril -valsartan  (ENTRESTO ) 49-51 MG Take 1 tablet by mouth 2 (two) times daily.     Semaglutide , 1 MG/DOSE, 4 MG/3ML SOPN Inject 1 mg as directed once a week. 3 mL 7   senna-docusate (SENOKOT-S) 8.6-50 MG tablet Take 1 tablet by mouth at bedtime as needed for mild constipation. 15 tablet 0   TURMERIC CURCUMIN PO Take 1 capsule by mouth daily.     No current facility-administered medications for this visit.    Socially he is married has 2 children 2 grandchildren. He does walk several times  a week. He does have a tobacco history. There is no alcohol use.   ROS General:  Negative; No fevers, chills, or night sweats; positive for obesity. HEENT: Negative; No changes in vision or hearing, sinus congestion, difficulty swallowing Pulmonary: Negative; No cough, wheezing, shortness of breath, hemoptysis Cardiovascular: See HPI GI: Colonic abscess requiring colostomy, and ultimate closure; positive for dyspepsia GU: Negative; No dysuria, hematuria, or difficulty voiding Musculoskeletal: He is status post left knee surgery; no myalgias, joint pain, or weakness Hematologic/Oncology: Negative; no easy bruising, bleeding Endocrine: Negative; no heat/cold intolerance; no diabetes Neuro: Negative; no changes in balance, headaches Skin: Recurrent cysts in the back of his neck Psychiatric: Negative; No behavioral problems, depression Sleep: Positive for sleep apnea, on CPAP with splint compliance. No snoring, daytime sleepiness, hypersomnolence, bruxism, restless legs, hypnogognic hallucinations, no cataplexy Other comprehensive 14 point system review is negative.   PE BP 128/76   Pulse 63   Ht 5' 8 (1.727 m)   Wt 224 lb 9.6 oz (101.9 kg)   SpO2 93%   BMI 34.15 kg/m    Repeat blood pressure by me was 130/68  Wt Readings from Last 3 Encounters:  04/16/24 224 lb 9.6 oz (101.9 kg)  02/28/24 221 lb 8 oz (100.5 kg)  02/12/24 232 lb (105.2 kg)   General: Alert, oriented, no distress.  Skin: normal turgor, no rashes, warm and dry HEENT: Normocephalic, atraumatic. Pupils equal round and reactive to light; sclera anicteric; extraocular muscles intact;  Nose without nasal septal hypertrophy Mouth/Parynx benign; Mallinpatti scale 3 Neck: No JVD, no carotid bruits; normal carotid upstroke Lungs: clear to ausculatation and percussion; no wheezing or rales Chest wall: without tenderness to palpitation Heart: PMI not displaced, RRR, s1 s2 normal, 1/6 systolic murmur, no diastolic murmur, no rubs, gallops, thrills, or heaves Abdomen: Ventral hernia, multiple scars; soft,  nontender; no hepatosplenomehaly, BS+; abdominal aorta nontender and not dilated by palpation. Back: no CVA tenderness Pulses 2+ Musculoskeletal: full range of motion, normal strength, no joint deformities Extremities: no clubbing cyanosis or edema, Homan's sign negative  Neurologic: grossly nonfocal; Cranial nerves grossly wnl Psychologic: Normal mood and affect  EKG Interpretation Date/Time:  Wednesday April 16 2024 10:41:02 EDT Ventricular Rate:  63 PR Interval:  222 QRS Duration:  90 QT Interval:  434 QTC Calculation: 444 R Axis:   -10  Text Interpretation: Sinus rhythm with 1st degree A-V block with Premature supraventricular complexes When compared with ECG of 03-Oct-2023 09:25, Premature supraventricular complexes are now Present Nonspecific T wave abnormality, worse in Lateral leads Confirmed by Burnard Ned ([id number]) on 04/21/2024 2:54:57 PM    September 07, 2022 ECG (independently read by me): NSR at 73, No STT changes  October 25, 2021 ECG (independently read by me):  Sinus bradycardia at 43; mild T wave abnormality  December 20, 2020 ECG (independently read by me): Sinus bradycardia at 49  August 2021 ECG (independently read by me): Sinus Bradycardia at 43; no ectopy; normal intervals  July 2021 ECG (independently read by me): Sinus bradycardia at 43 bpm.  No significant ST-T changes.  Normal intervals.  No ectopy  November 10, 2019 ECG (independently read by me):Sinus bradycardia at 46 bpm.  Nonspecific T changes.  Normal intervals.  No ectopy  February 2020 ECG (independently read by me): Sinus Bradycardioa 51; Norml intervals  June 2018 ECG (independently read by me): Sinus bradycardia 57 bpm.  No ectopy.  Normal intervals.  October 2018 ECG (independently read by me): Normal sinus rhythm with  PAC.  PR interval 168 ms.  QTc interval 454 ms.  May 2017 ECG (independently read by me): Sinus bradycardia 52 bpm.  T-wave abnormality in leads 3 and aVF.  May 2016 ECG  (independently read by me): Sinus bradycardia 53 bpm.  Nondiagnostic T changes with T-wave inversion in lead 3.  June 2015 ECG (independently read by me):  sinus bradycardia at 55 QTc interval 447 ms.  PR interval 174 ms.  Nondiagnostic T changes in lead 3.  ECG: Sinus rhythm at 56 beats per minute. No ectopy. Normal intervals; nonspecific T changes in lead 3.  LABS:    Latest Ref Rng & Units 08/27/2023   12:00 AM 06/13/2023   10:29 AM 02/05/2023   10:37 AM  BMP  Glucose 70 - 99 mg/dL  813  761   BUN 6 - 23 mg/dL  23  23   Creatinine 0.6 - 1.3 1.0     1.43  1.38   Sodium 137 - 147 140     138  136   Potassium 3.5 - 5.1 mEq/L 4.6     4.2  4.1   Chloride 96 - 112 mEq/L  103  102   CO2 19 - 32 mEq/L  26  24   Calcium  8.4 - 10.5 mg/dL  9.6  9.6      This result is from an external source.      Latest Ref Rng & Units 08/27/2023   12:00 AM 06/13/2023   10:29 AM 02/05/2023   10:37 AM  Hepatic Function  Total Protein 6.0 - 8.3 g/dL  6.8    Albumin  3.5 - 5.0 4.9     4.4  4.4   AST 14 - 40 14     13    ALT 10 - 40 U/L 20     13    Alk Phosphatase 25 - 125 78     66    Total Bilirubin 0.2 - 1.2 mg/dL  0.6    Bilirubin, Direct 0.01 - 0.4 0.2           This result is from an external source.       Latest Ref Rng & Units 08/27/2023   12:00 AM 06/13/2023   10:31 AM 02/05/2023   10:37 AM  CBC  WBC  5.2     6.6  5.5   Hemoglobin 13.5 - 17.5 14.5     13.8  13.8   Hematocrit 39.0 - 52.0 %  40.7  39.9   Platelets 150 - 400 K/uL 141     148.0  147.0      This result is from an external source.   Lab Results  Component Value Date   TSH 1.13 08/27/2023   Lab Results  Component Value Date   HGBA1C 8.1 (A) 11/13/2023   Lipid Panel     Component Value Date/Time   CHOL 99 08/27/2023 0000   CHOL 149 03/13/2016 0803   TRIG 296 (A) 08/27/2023 0000   TRIG 387 (H) 03/13/2016 0803   HDL 25 (A) 08/27/2023 0000   HDL 26 (L) 03/13/2016 0803   CHOLHDL 5 06/13/2023 1029   VLDL 37.0  05/30/2022 0726   LDLCALC 28 08/27/2023 0000   LDLCALC 46 03/13/2016 0803   LDLDIRECT 28.0 06/13/2023 1029    RADIOLOGY: No results found.  Lexiscan  Myoview  05/11/2020 Study Highlights    The left ventricular ejection fraction is moderately decreased (30-44%). Nuclear stress EF: 43%. No T wave inversion  was noted during stress. There was no ST segment deviation noted during stress. Defect 1: There is a small defect of mild severity present in the mid inferior location. Findings consistent with prior myocardial infarction. This is an intermediate risk study.   Small size, mild severity fixed mid inferior perfusion defect, likely scar. LVEF 43% with basal to mid inferior hypokinesis. This is an intermediate risk study. Compared to a prior study in 2019, the LVEF is calculated lower, but appears visually higher - the perfusion defect appears unchanged.    CATH/PCI: 08/06/2022 CONCLUSIONS: Cardiac arrest in the setting of ACS identified that the mid RCA above a previously placed stent is 99% stenosis with TIMI grade II flow and recruited left-to-right collaterals distally.  The vessel is heavily calcified and the entire proximal segment is segmentally 50% narrowed. The culprit lesion was dilated and then stented with a 28 x 3.5 mm Synergy postdilated using high-pressure with a 3.75 x 22 [place] balloon to 17 atm x 2.  TIMI grade III flow established.  0% stenosis was noted at the site of the 99% stenosis but persistent 50% underexpansion was noted proximal to the culprit. Left main is widely patent LAD is widely patent Circumflex is widely patent Inferior wall to the apex is severely hypokinetic to akinetic.  EDP is 17.  EF is 30 to 35%.   RECOMMENDATIONS:   Unknown if patient took Plavix  this morning.  Cangrelor  started in Cath Lab.  Once NG tube is in place cangrelor  will be discontinued and 300 mg of clopidogrel  given per NG tube. Management of ventilator and postcardiac arrest state by  critical care medicine.  Intention is for patient to be on aspirin  and Plavix . Sheath will be pulled according to protocol when ACT/Angiomax  are at target.      Intervention        ECHO: 02/27/2023  1. Left ventricular ejection fraction, by estimation, is 60 to 65%. The  left ventricle has normal function. The left ventricle demonstrates  regional wall motion abnormalities (see scoring diagram/findings for  description). There is mild concentric left  ventricular hypertrophy. Left ventricular diastolic parameters are  indeterminate. There is mild hypokinesis of the left ventricular, basal  inferior wall and inferolateral wall.   2. Right ventricular systolic function is normal. The right ventricular  size is normal.   3. The mitral valve is grossly normal. Trivial mitral valve  regurgitation. No evidence of mitral stenosis.   4. The aortic valve is tricuspid. There is mild calcification of the  aortic valve. There is mild thickening of the aortic valve. Aortic valve  regurgitation is not visualized. Aortic valve sclerosis is present, with  no evidence of aortic valve stenosis.   Comparison(s): Changes from prior study are noted. EF improved compared to  prior.   IMPRESSION:  1. Coronary artery disease involving native coronary artery of native heart with unstable angina pectoris (HCC)   2. Ischemic cardiomyopathy: Improved   3. Essential hypertension   4. OSA on CPAP   5. Hyperlipidemia with target LDL less than 55   6. Type 2 diabetes mellitus with complication, without long-term current use of insulin  (HCC)   7. Tobacco history     ASSESSMENT AND PLAN: Mr. Fiallos  is a 78 year old Caucasian male who I have been following since 1992.  He has CAD and underwent initial PTCA to his RCA in 1992.  At catheterization in October 2009 a fourth stent was placed in his RCA.   He has  been maintained on dual antiplatelet therapy with aspirin  and Plavix  with his multiple stents.  A  preoperative Lexiscan  Myoview  study in July 2019 prior to his back surgery was low risk and showed a small inferior defect with EF 50%.  When I saw him in July 2021 he had developed nonexertional episodes of chest pressure which were short-lived and often improved with antacid therapy. Symptoms would typically last a minute or 2.  He was started on Pepcid  with improvement.  At the time he was drinking over 6 cups of coffee per day which undoubtedly contributed to his dyspeptic symptomatology.  His Lexiscan  Myoview  study was unchanged from 2019 and showed mild severity fixed mid inferior perfusion defect most likely scar.  Nuclear EF was 43% but visually it appeared higher.  When I saw him in January 2023, he was experiencing some reflux symptomatology but at times  experienced some vague left arm sensation.  Due to sinus bradycardia with heart rate at 43 his atenolol  dose was reduced from 25 twice a day to 25 mg in the morning and 12.5 mg at night.  Isosorbide  30 mg was added to his medical regimen and he continued to be on DAPT with aspirin /Plavix , and for hyperlipidemia atorvastatin  40 mg, Lovaza  2 capsules twice a day, and Zetia .  He was diabetic on glimepiride , metformin  in addition to pioglitazone .  He suffered a witnessed cardiac arrest at home on August 06, 2022 and CPR was started by his wife.  When EMS arrived, he required 6 shocks for VF before ROSC.  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   Although acute EF was 30 to 35%, One day later EF had improved to 45 to 50%.  He is on guideline directed medical therapy with Jardiance  10 mg, Entresto  49/51 mg twice a day, furosemide  20 mg daily in addition to continue DAPT with aspirin /Plavix .  When I saw him in November 2023 I added metoprolol  succinate 12.5 mg with his resting pulse at 73.  Subsequent echo Doppler study from 05/30/2023 remarkably showed complete  normalization of LV function with EF 60 to 65%.  There was mild concentric LVH and mild residual hypokinesis in the basal inferior and inferolateral wall.  When I last saw him in December 2024, he was doing well and was without chest pain or shortness of breath.  Blood pressure was on the low side and I suggested he may try reducing furosemide  to 10 mg as long as he did not have any significant edema.  Presently, blood pressure today is stable and on recheck by me was 130/68.  He admits to easy bruisability.  He has been maintained on long-term DAPT with aspirin  and clopidogrel .  I have recommended that he can discontinue aspirin  but continue with clopidogrel  alone.  He continues to be on guideline directed medical therapy with Entresto  49/51 mg twice a day, furosemide , Jardiance  10 mg, metoprolol  succinate 12.5 mg daily.  He is diabetic on glimepiride , Jardiance , and semaglutide .  For lipids he is on atorvastatin  80 mg, Zetia  10 mg, and Lovaza  2 capsules twice a day.  He received a new ResMed AirSense 11 CPAP unit from the [facility] in December 2024.  Previously I had discussed potential need for possible BiPAP and remotely he was having a fair amount of central events.  His most recent download shows excellent use.  He cannot sleep without it.  However residual AHI is elevated at  14.2 and his pressure range is set at a minimum of 11 and maximum of 17 with 95th percentile pressure of 13.2 and maximum average pressure at 14.5.  I suspect his significant mask leak is contributing to his elevated AHI.  I have recommended he obtain a new machine from the [facility].  They will also need to potentially adjust his parameters as they follow him and increase his minimum pressure to 13 cm.  Recent laboratory at the [facility] had shown total cholesterol 99, LDL 28 with elevated triglycerides.  He sees Dr. Anton Blas in [place] for primary care.  He and his wife are aware of my imminent retirement later this week.  I will  transition him to the care of Dr. Jerel Croitoru with plan follow-up in approximately 6 months or sooner as needed   Debby RONAL Sor, MD, [facility], ABSM Diplomate, American Board of Sleep Medicine   04/21/2024 3:03 PM

## 2024-04-16 NOTE — Patient Instructions (Addendum)
 Medication Instructions:  Your physician has recommended you make the following change in your medication:   -Stop taking aspirin .  *If you need a refill on your cardiac medications before your next appointment, please call your pharmacy*  Testing/Procedures: Your physician has requested that you have an echocardiogram. Echocardiography is a painless test that uses sound waves to create images of your heart. It provides your doctor with information about the size and shape of your heart and how well your heart's chambers and valves are working. This procedure takes approximately one hour. There are no restrictions for this procedure. Please do NOT wear cologne, perfume, aftershave, or lotions (deodorant is allowed). Please arrive 15 minutes prior to your appointment time.  Please note: We ask at that you not bring children with you during ultrasound (echo/ vascular) testing. Due to room size and safety concerns, children are not allowed in the ultrasound rooms during exams. Our front office staff cannot provide observation of children in our lobby area while testing is being conducted. An adult accompanying a patient to their appointment will only be allowed in the ultrasound room at the discretion of the ultrasound technician under special circumstances. We apologize for any inconvenience.   Follow-Up: At [facility], you and your health needs are our priority.  As part of our continuing mission to provide you with exceptional heart care, our providers are all part of one team.  This team includes your primary Cardiologist (physician) and Advanced Practice Providers or APPs (Physician Assistants and Nurse Practitioners) who all work together to provide you with the care you need, when you need it.  Your next appointment:   6 month(s)  Provider:   Dr. Jerel Balding   We recommend signing up for the patient portal called MyChart.  Sign up information is provided on this After  Visit Summary.  MyChart is used to connect with patients for Virtual Visits (Telemedicine).  Patients are able to view lab/test results, encounter notes, upcoming appointments, etc.  Non-urgent messages can be sent to your provider as well.   To learn more about what you can do with MyChart, go to [email or web address].

## 2024-04-21 ENCOUNTER — Encounter: Payer: Self-pay | Admitting: Cardiovascular Disease

## 2024-06-02 ENCOUNTER — Ambulatory Visit (HOSPITAL_COMMUNITY)
Admission: RE | Admit: 2024-06-02 | Discharge: 2024-06-02 | Disposition: A | Discharge status: Home / Self Care | Source: Ambulatory Visit | Attending: Cardiovascular Disease | Admitting: Cardiovascular Disease

## 2024-06-02 ENCOUNTER — Ambulatory Visit: Payer: Self-pay | Admitting: Internal Medicine

## 2024-06-02 DIAGNOSIS — I10 Essential (primary) hypertension: Secondary | ICD-10-CM | POA: Diagnosis not present

## 2024-06-02 DIAGNOSIS — I255 Ischemic cardiomyopathy: Secondary | ICD-10-CM | POA: Diagnosis not present

## 2024-06-02 DIAGNOSIS — I25110 Atherosclerotic heart disease of native coronary artery with unstable angina pectoris: Secondary | ICD-10-CM | POA: Diagnosis not present

## 2024-06-02 LAB — ECHOCARDIOGRAM COMPLETE
Area-P 1/2: 4.1 cm2
S' Lateral: 2.7 cm

## 2024-06-07 ENCOUNTER — Other Ambulatory Visit: Payer: Self-pay | Admitting: Family Medicine

## 2024-06-07 DIAGNOSIS — D696 Thrombocytopenia, unspecified: Secondary | ICD-10-CM

## 2024-06-07 DIAGNOSIS — E1169 Type 2 diabetes mellitus with other specified complication: Secondary | ICD-10-CM

## 2024-06-07 DIAGNOSIS — K760 Fatty (change of) liver, not elsewhere classified: Secondary | ICD-10-CM

## 2024-06-07 DIAGNOSIS — E538 Deficiency of other specified B group vitamins: Secondary | ICD-10-CM

## 2024-06-09 ENCOUNTER — Other Ambulatory Visit (INDEPENDENT_AMBULATORY_CARE_PROVIDER_SITE_OTHER): Payer: PPO

## 2024-06-09 DIAGNOSIS — E785 Hyperlipidemia, unspecified: Secondary | ICD-10-CM

## 2024-06-09 DIAGNOSIS — K760 Fatty (change of) liver, not elsewhere classified: Secondary | ICD-10-CM | POA: Diagnosis not present

## 2024-06-09 DIAGNOSIS — D696 Thrombocytopenia, unspecified: Secondary | ICD-10-CM

## 2024-06-09 DIAGNOSIS — E538 Deficiency of other specified B group vitamins: Secondary | ICD-10-CM | POA: Diagnosis not present

## 2024-06-09 DIAGNOSIS — E1169 Type 2 diabetes mellitus with other specified complication: Secondary | ICD-10-CM | POA: Diagnosis not present

## 2024-06-09 LAB — COMPREHENSIVE METABOLIC PANEL WITH GFR
ALT: 16 U/L (ref 0–53)
AST: 15 U/L (ref 0–37)
Albumin: 4.4 g/dL (ref 3.5–5.2)
Alkaline Phosphatase: 61 U/L (ref 39–117)
BUN: 27 mg/dL — ABNORMAL HIGH (ref 6–23)
CO2: 25 meq/L (ref 19–32)
Calcium: 9.3 mg/dL (ref 8.4–10.5)
Chloride: 104 meq/L (ref 96–112)
Creatinine, Ser: 1.41 mg/dL (ref 0.40–1.50)
GFR: 47.62 mL/min — ABNORMAL LOW (ref >60.00)
Glucose, Bld: 184 mg/dL — ABNORMAL HIGH (ref 70–99)
Potassium: 4.2 meq/L (ref 3.5–5.1)
Sodium: 141 meq/L (ref 135–145)
Total Bilirubin: 0.6 mg/dL (ref 0.2–1.2)
Total Protein: 6.4 g/dL (ref 6.0–8.3)

## 2024-06-09 LAB — CBC WITH DIFFERENTIAL/PLATELET
Basophils Absolute: 0.1 K/uL (ref 0.0–0.1)
Basophils Relative: 1.2 % (ref 0.0–3.0)
Eosinophils Absolute: 0.3 K/uL (ref 0.0–0.7)
Eosinophils Relative: 5.4 % — ABNORMAL HIGH (ref 0.0–5.0)
HCT: 39.8 % (ref 39.0–52.0)
Hemoglobin: 13.5 g/dL (ref 13.0–17.0)
Lymphocytes Relative: 31.6 % (ref 12.0–46.0)
Lymphs Abs: 1.7 K/uL (ref 0.7–4.0)
MCHC: 34 g/dL (ref 30.0–36.0)
MCV: 92.5 fl (ref 78.0–100.0)
Monocytes Absolute: 0.4 K/uL (ref 0.1–1.0)
Monocytes Relative: 8.1 % (ref 3.0–12.0)
Neutro Abs: 2.9 K/uL (ref 1.4–7.7)
Neutrophils Relative %: 53.7 % (ref 43.0–77.0)
Platelets: 119 K/uL — ABNORMAL LOW (ref 150.0–400.0)
RBC: 4.3 Mil/uL (ref 4.22–5.81)
RDW: 13.8 % (ref 11.5–15.5)
WBC: 5.4 K/uL (ref 4.0–10.5)

## 2024-06-09 LAB — LIPID PANEL
Cholesterol: 88 mg/dL (ref 0–200)
HDL: 34.1 mg/dL — ABNORMAL LOW (ref >39.00)
LDL Cholesterol: 30 mg/dL (ref 0–99)
NonHDL: 54.21
Total CHOL/HDL Ratio: 3
Triglycerides: 123 mg/dL (ref 0.0–149.0)
VLDL: 24.6 mg/dL (ref 0.0–40.0)

## 2024-06-09 LAB — VITAMIN B12: Vitamin B-12: >1500 pg/mL — ABNORMAL HIGH (ref 211–911)

## 2024-06-09 LAB — HEMOGLOBIN A1C: Hgb A1c MFr Bld: 8.4 % — ABNORMAL HIGH (ref 4.6–6.5)

## 2024-06-09 LAB — MICROALBUMIN / CREATININE URINE RATIO
Creatinine,U: 81.3 mg/dL
Microalb Creat Ratio: 103 mg/g — ABNORMAL HIGH (ref 0.0–30.0)
Microalb, Ur: 8.4 mg/dL — ABNORMAL HIGH (ref 0.0–1.9)

## 2024-06-10 ENCOUNTER — Ambulatory Visit: Payer: Self-pay | Admitting: Family Medicine

## 2024-06-16 ENCOUNTER — Ambulatory Visit: Payer: PPO | Admitting: Family Medicine

## 2024-06-16 ENCOUNTER — Encounter: Payer: Self-pay | Admitting: Family Medicine

## 2024-06-16 VITALS — BP 124/64 | HR 64 | Temp 97.9°F | Ht 68.0 in | Wt 233.1 lb

## 2024-06-16 DIAGNOSIS — Z0000 Encounter for general adult medical examination without abnormal findings: Secondary | ICD-10-CM | POA: Diagnosis not present

## 2024-06-16 DIAGNOSIS — I255 Ischemic cardiomyopathy: Secondary | ICD-10-CM

## 2024-06-16 DIAGNOSIS — Z7984 Long term (current) use of oral hypoglycemic drugs: Secondary | ICD-10-CM | POA: Diagnosis not present

## 2024-06-16 DIAGNOSIS — E538 Deficiency of other specified B group vitamins: Secondary | ICD-10-CM | POA: Diagnosis not present

## 2024-06-16 DIAGNOSIS — I10 Essential (primary) hypertension: Secondary | ICD-10-CM | POA: Diagnosis not present

## 2024-06-16 DIAGNOSIS — Z7189 Other specified counseling: Secondary | ICD-10-CM

## 2024-06-16 DIAGNOSIS — Z7985 Long-term (current) use of injectable non-insulin antidiabetic drugs: Secondary | ICD-10-CM

## 2024-06-16 DIAGNOSIS — I25110 Atherosclerotic heart disease of native coronary artery with unstable angina pectoris: Secondary | ICD-10-CM

## 2024-06-16 DIAGNOSIS — H401132 Primary open-angle glaucoma, bilateral, moderate stage: Secondary | ICD-10-CM

## 2024-06-16 DIAGNOSIS — Z87891 Personal history of nicotine dependence: Secondary | ICD-10-CM

## 2024-06-16 DIAGNOSIS — K439 Ventral hernia without obstruction or gangrene: Secondary | ICD-10-CM

## 2024-06-16 DIAGNOSIS — E1121 Type 2 diabetes mellitus with diabetic nephropathy: Secondary | ICD-10-CM | POA: Diagnosis not present

## 2024-06-16 DIAGNOSIS — E1169 Type 2 diabetes mellitus with other specified complication: Secondary | ICD-10-CM | POA: Diagnosis not present

## 2024-06-16 DIAGNOSIS — E785 Hyperlipidemia, unspecified: Secondary | ICD-10-CM | POA: Diagnosis not present

## 2024-06-16 DIAGNOSIS — Z955 Presence of coronary angioplasty implant and graft: Secondary | ICD-10-CM

## 2024-06-16 DIAGNOSIS — J432 Centrilobular emphysema: Secondary | ICD-10-CM

## 2024-06-16 DIAGNOSIS — D696 Thrombocytopenia, unspecified: Secondary | ICD-10-CM

## 2024-06-16 DIAGNOSIS — G4733 Obstructive sleep apnea (adult) (pediatric): Secondary | ICD-10-CM

## 2024-06-16 DIAGNOSIS — Z77098 Contact with and (suspected) exposure to other hazardous, chiefly nonmedicinal, chemicals: Secondary | ICD-10-CM

## 2024-06-16 MED ORDER — CETIRIZINE HCL 10 MG PO TABS: 10.0000 mg | ORAL_TABLET | Freq: Every day | ORAL | 3 refills | Status: AC

## 2024-06-16 MED ORDER — METFORMIN HCL 1000 MG PO TABS: 1000.0000 mg | ORAL_TABLET | Freq: Two times a day (BID) | ORAL | 4 refills | Status: AC

## 2024-06-16 MED ORDER — OMEGA-3-ACID ETHYL ESTERS 1 G PO CAPS: 2.0000 | ORAL_CAPSULE | Freq: Two times a day (BID) | ORAL | 3 refills | Status: AC

## 2024-06-16 MED ORDER — NITROGLYCERIN 0.4 MG SL SUBL: SUBLINGUAL_TABLET | SUBLINGUAL | 3 refills | Status: AC

## 2024-06-16 MED ORDER — GLIMEPIRIDE 4 MG PO TABS
4.0000 mg | ORAL_TABLET | Freq: Every day | ORAL | 3 refills | Status: AC
Start: 2024-06-16 — End: ?

## 2024-06-16 MED ORDER — FLUTICASONE PROPIONATE 50 MCG/ACT NA SUSP: 2.0000 | Freq: Every day | NASAL | 1 refills | Status: AC

## 2024-06-16 MED ORDER — GLIMEPIRIDE 2 MG PO TABS: 2.0000 mg | ORAL_TABLET | Freq: Every day | ORAL | 4 refills | Status: DC

## 2024-06-16 MED ORDER — SEMAGLUTIDE (1 MG/DOSE) 4 MG/3ML ~~LOC~~ SOPN: 1.0000 mg | PEN_INJECTOR | SUBCUTANEOUS | 3 refills | Status: AC

## 2024-06-16 MED ORDER — VITAMIN B-12 1000 MCG PO TABS: 1000.0000 ug | ORAL_TABLET | ORAL | Status: AC

## 2024-06-16 MED ORDER — CLOPIDOGREL BISULFATE 75 MG PO TABS: 75.0000 mg | ORAL_TABLET | Freq: Every day | ORAL | 3 refills | Status: AC

## 2024-06-16 NOTE — Assessment & Plan Note (Signed)
Advanced directive discussion - does not want prolonged life support. Would want wife to be HCPOA. Packet previously provided, again today.

## 2024-06-16 NOTE — Assessment & Plan Note (Signed)
 Chronic, deteriorated control in setting of stopping smoking and increasing snacking/weight gain.  Continue current regimen, increase amaryl  to 4mg  daily with breakfast. Reassess control at 6 mo DM f/u visit. Pt declines sooner f/u

## 2024-06-16 NOTE — Assessment & Plan Note (Signed)
 Levels now too high - hold B12 for 2 wks then drop to once weekly dosing.

## 2024-06-16 NOTE — Assessment & Plan Note (Signed)
 Preventative protocols reviewed and updated unless pt declined. Discussed healthy diet and lifestyle.

## 2024-06-16 NOTE — Assessment & Plan Note (Signed)
 Discussed weight gain noted, encouraged healthy diet and lifestyle choices to affect sustainable weight loss. Continue ozempic  1mg  weekly.

## 2024-06-16 NOTE — Patient Instructions (Addendum)
 Restart allergy medicines for nasal congestion  Cut down on snacking, choose healthier snacks.  Double check that you've had prevnar-20 at the [facility] (pneumonia shot)  Increase glimepiride  (amaryl ) to 4mg  daily. Hold b12 for 2 weeks then start once weekly dosing.  Advanced directive packet provided today.  Good to see you today  Return as needed or in 6 months for diabetes follow up visit.

## 2024-06-16 NOTE — Assessment & Plan Note (Signed)
 Chronic, stable on current regimen - continue. The ASCVD Risk score (Arnett DK, et al., 2019) failed to calculate for the following reasons:   Risk score cannot be calculated because patient has a medical history suggesting prior/existing ASCVD

## 2024-06-16 NOTE — Assessment & Plan Note (Signed)
 On CPAP followed by [facility]

## 2024-06-16 NOTE — Assessment & Plan Note (Addendum)
 Congratulated on smoking cessation.  He notes increased appetite since quitting.

## 2024-06-16 NOTE — Progress Notes (Signed)
 Ph: [phone] Fax: [phone]   Patient ID: Evan Avery, male    DOB: November 13, 1945, 78 y.o.   MRN: [id number]  This visit was conducted in person.  BP 124/64   Pulse 64   Temp 97.9 F (36.6 C) (Oral)   Ht 5' 8 (1.727 m)   Wt 233 lb 2 oz (105.7 kg)   SpO2 95%   BMI 35.45 kg/m    CC: CPE Subjective:   HPI: Evan Avery is a 78 y.o. male presenting on 06/16/2024 for Annual Exam   Saw health advisor 01/2024 for medicare wellness visit. Note reviewed.  No results found.  Flowsheet Row Office Visit from 06/16/2024 in [facility] [place]  PHQ-2 Total Score 0       06/16/2024    9:22 AM 02/12/2024    2:27 PM 11/13/2023   10:06 AM 06/13/2023    9:47 AM 02/07/2023    2:40 PM  Fall Risk   Falls in the past year? 0 0 0 0 1  Number falls in past yr: 0 0  0 0  Comment     pt had heart attack in 10/23 fell back on floor  Injury with Fall? 0 0  0 1  Comment     pt had heart attack in Oct 2023  Risk for fall due to : No Fall Risks No Fall Risks  No Fall Risks   Follow up  Falls prevention discussed;Education provided  Falls evaluation completed    Military disability through the [facility]. 10% disability for tinnitus.   Regularly sees cardiology for ischemic cardiomyopathy s/p out of hospital arrest 07/2022 with subsequent ICU stay. Last seen 03/2024 Dr Molinda. Echocardiogram 05/2024 reassuring. Requests nitroglycerin  SL refilled.   Quit smoking 03/06/2024! - 12 lb weight gain since then.    POAG severe right, moderate left - [facility]. Known h/o dry eyes. Had cataract surgery in [place] early 2025   He receives Entresto  and Jardiance  through the [facility].   DM - on jardiance  10mg  daily, metformin  1000mg  bid, glimepiride  2mg  daily. Last visit started ozempic  - currently on 0.5mg  weekly dose. Tolerating well without nausea, constipation or abd pain. Notes frequent urination without dysuria, fever.  Lab Results  Component Value  Date   HGBA1C 8.4 (H) 06/09/2024    HLD - continues atorvastatin  80mg  daily with zetia  10mg  daily as well as Lovaza .  Continued neuropathy managed with gabapentin  100mg  TID.  Woke up today with nasal congestion.    Preventative: COLONOSCOPY 08/2020 - multiple polpys removed (TA, HP), 1.5cm polyp biopsied (SSP), colon lipoma [facility])  COLONOSCOPY 10/2020 - 1.5cm SSP, HP, rpt 2 yrs [facility])  Saw GI 02/2024 - discussed rpt colonoscopy, rec against this given cardiac history. Prostate cancer screening - aged out  Lung cancer screening - undergoing first done 03/2019. Has decided to stop.  Flu shot yearly  COVID vaccine Pfizer 11/2019, 12/2019, no booster Tdap 2014 , Td 09/2021 Pneumovax 2014, prevnar-13 2015, thinks has had latest pneumonia shot from [facility] RSV 02/2023 Shingrix - 09/2021, 02/2023 Advanced directive discussion - does not want prolonged life support. Would want wife to be HCPOA. Packet previously provided, again today.  Seat belt use discussed  Sunscreen use discussed, no changing moles. Has not seen skin doctor recently - previously told benign nevus to top of head.  Ex smoker - quit 04/2016 - restarted 1 ppd, quit 03/06/2024. 50+ PY.  Alcohol - rare  Dentist  q6 mo - upper dentures Eye exam Q3-4 mo through [facility] - glaucoma  Bowel - no constipation  Bladder - no incontinence   Lives with wife and son Occupation: retired, was Tax inspector for [employer] Activity: no regular exercise due to foot pain Diet: good water , fruits/vegetables daily      Relevant past medical, surgical, family and social history reviewed and updated as indicated. Interim medical history since our last visit reviewed. Allergies and medications reviewed and updated. Outpatient Medications Prior to Visit  Medication Sig Dispense Refill   atorvastatin  (LIPITOR ) 80 MG tablet Take 1 tablet by mouth once daily 90 tablet 1   Coenzyme Q10 (COQ10) 200 MG CAPS Take 200 mg by mouth daily.     cycloSPORINE  (RESTASIS) 0.05 % ophthalmic emulsion Apply to eye.     empagliflozin  (JARDIANCE ) 10 MG TABS tablet Take 1 tablet (10 mg total) by mouth daily before breakfast.     ezetimibe  (ZETIA ) 10 MG tablet Take 1 tablet by mouth once daily 90 tablet 1   gabapentin  (NEURONTIN ) 100 MG capsule Take 100 mg by mouth 3 (three) times daily.     metoprolol  succinate (TOPROL -XL) 25 MG 24 hr tablet Take 1/2 (one-half) tablet by mouth once daily 45 tablet 3   Netarsudil-Latanoprost  (ROCKLATAN) 0.02-0.005 % SOLN Apply 1 drop to eye at bedtime.     sacubitril -valsartan  (ENTRESTO ) 49-51 MG Take 1 tablet by mouth 2 (two) times daily.     senna-docusate (SENOKOT-S) 8.6-50 MG tablet Take 1 tablet by mouth at bedtime as needed for mild constipation. 15 tablet 0   TURMERIC CURCUMIN PO Take 1 capsule by mouth daily.     cetirizine  (ZYRTEC ) 10 MG tablet Take 1 tablet (10 mg total) by mouth daily. 90 tablet 3   clopidogrel  (PLAVIX ) 75 MG tablet Take 1 tablet by mouth once daily 90 tablet 0   cyanocobalamin  (VITAMIN B12) 1000 MCG tablet Take 1 tablet (1,000 mcg total) by mouth daily.     fluticasone  (FLONASE ) 50 MCG/ACT nasal spray Place 2 sprays into both nostrils daily. 16 g 1   furosemide  (LASIX ) 20 MG tablet Take 1 tablet by mouth once daily (Patient taking differently: Take 20 mg by mouth daily. TAKING 10 MG) 90 tablet 1   glimepiride  (AMARYL ) 2 MG tablet Take 1 tablet (2 mg total) by mouth daily before breakfast. 90 tablet 4   metFORMIN  (GLUCOPHAGE ) 1000 MG tablet Take 1 tablet (1,000 mg total) by mouth 2 (two) times daily with a meal. 180 tablet 4   nitroGLYCERIN  (NITROSTAT ) 0.4 MG SL tablet For chest pain, tightness, or pressure. While sitting, place 1 tablet under tongue. May be used every 5 minutes as needed, for up to 15 minutes. Do not use more than 3 tablets. 25 tablet 3   omega-3 acid ethyl esters (LOVAZA ) 1 g capsule Take 2 capsules by mouth twice daily 360 capsule 1   Semaglutide , 1 MG/DOSE, 4 MG/3ML SOPN Inject 1  mg as directed once a week. 3 mL 7   furosemide  (LASIX ) 20 MG tablet Take 0.5 tablets (10 mg total) by mouth daily.     ketotifen (ZADITOR) 0.035 % ophthalmic solution Apply 2 drops to eye daily. (Patient not taking: Reported on 06/16/2024)     No facility-administered medications prior to visit.     Per HPI unless specifically indicated in ROS section below Review of Systems  Constitutional:  Negative for activity change, appetite change, chills, fatigue, fever and unexpected weight change.  HENT:  Positive  for congestion, postnasal drip and rhinorrhea. Negative for hearing loss.        Started today   Eyes:  Negative for visual disturbance.  Respiratory:  Negative for cough, chest tightness, shortness of breath and wheezing.   Cardiovascular:  Negative for chest pain, palpitations and leg swelling.  Gastrointestinal:  Negative for abdominal distention, abdominal pain, blood in stool, constipation, diarrhea, nausea and vomiting.  Genitourinary:  Negative for difficulty urinating and hematuria.  Musculoskeletal:  Negative for arthralgias, myalgias and neck pain.  Skin:  Negative for rash.  Neurological:  Negative for dizziness, seizures, syncope and headaches.  Hematological:  Negative for adenopathy. Bruises/bleeds easily.  Psychiatric/Behavioral:  Negative for dysphoric mood. The patient is not nervous/anxious.     Objective:  BP 124/64   Pulse 64   Temp 97.9 F (36.6 C) (Oral)   Ht 5' 8 (1.727 m)   Wt 233 lb 2 oz (105.7 kg)   SpO2 95%   BMI 35.45 kg/m   Wt Readings from Last 3 Encounters:  06/16/24 233 lb 2 oz (105.7 kg)  04/16/24 224 lb 9.6 oz (101.9 kg)  02/28/24 221 lb 8 oz (100.5 kg)      Physical Exam Vitals and nursing note reviewed.  Constitutional:      General: He is not in acute distress.    Appearance: Normal appearance. He is well-developed. He is not ill-appearing.  HENT:     Head: Normocephalic and atraumatic.     Right Ear: Hearing, tympanic membrane,  ear canal and external ear normal.     Left Ear: Hearing, tympanic membrane, ear canal and external ear normal.     Mouth/Throat:     Mouth: Mucous membranes are moist.     Pharynx: Oropharynx is clear. No oropharyngeal exudate or posterior oropharyngeal erythema.  Eyes:     General: No scleral icterus.    Extraocular Movements: Extraocular movements intact.     Conjunctiva/sclera: Conjunctivae normal.     Pupils: Pupils are equal, round, and reactive to light.  Neck:     Thyroid : No thyroid  mass or thyromegaly.     Vascular: No carotid bruit.  Cardiovascular:     Rate and Rhythm: Normal rate and regular rhythm.     Pulses: Normal pulses.          Radial pulses are 2+ on the right side and 2+ on the left side.     Heart sounds: Normal heart sounds. No murmur heard. Pulmonary:     Effort: Pulmonary effort is normal. No respiratory distress.     Breath sounds: Normal breath sounds. No wheezing, rhonchi or rales.  Abdominal:     General: Bowel sounds are normal. There is no distension.     Palpations: Abdomen is soft. There is no mass.     Tenderness: There is no abdominal tenderness. There is no guarding or rebound.     Hernia: No hernia is present.  Musculoskeletal:        General: Normal range of motion.     Cervical back: Normal range of motion and neck supple.     Right lower leg: No edema.     Left lower leg: No edema.  Lymphadenopathy:     Cervical: No cervical adenopathy.  Skin:    General: Skin is warm and dry.     Findings: No rash.  Neurological:     General: No focal deficit present.     Mental Status: He is alert and oriented to person,  place, and time.  Psychiatric:        Mood and Affect: Mood normal.        Behavior: Behavior normal.        Thought Content: Thought content normal.        Judgment: Judgment normal.       Results for orders placed or performed in visit on 06/09/24  Vitamin B12   Collection Time: 06/09/24  8:10 AM  Result Value Ref Range    Vitamin B-12 >1500 (H) 211 - 911 pg/mL  CBC with Differential/Platelet   Collection Time: 06/09/24  8:10 AM  Result Value Ref Range   WBC 5.4 4.0 - 10.5 K/uL   RBC 4.30 4.22 - 5.81 Mil/uL   Hemoglobin 13.5 13.0 - 17.0 g/dL   HCT 60.1 60.9 - 47.9 %   MCV 92.5 78.0 - 100.0 fl   MCHC 34.0 30.0 - 36.0 g/dL   RDW 86.1 88.4 - 84.4 %   Platelets 119.0 (L) 150.0 - 400.0 K/uL   Neutrophils Relative % 53.7 43.0 - 77.0 %   Lymphocytes Relative 31.6 12.0 - 46.0 %   Monocytes Relative 8.1 3.0 - 12.0 %   Eosinophils Relative 5.4 (H) 0.0 - 5.0 %   Basophils Relative 1.2 0.0 - 3.0 %   Neutro Abs 2.9 1.4 - 7.7 K/uL   Lymphs Abs 1.7 0.7 - 4.0 K/uL   Monocytes Absolute 0.4 0.1 - 1.0 K/uL   Eosinophils Absolute 0.3 0.0 - 0.7 K/uL   Basophils Absolute 0.1 0.0 - 0.1 K/uL  Comprehensive metabolic panel with GFR   Collection Time: 06/09/24  8:10 AM  Result Value Ref Range   Sodium 141 135 - 145 mEq/L   Potassium 4.2 3.5 - 5.1 mEq/L   Chloride 104 96 - 112 mEq/L   CO2 25 19 - 32 mEq/L   Glucose, Bld 184 (H) 70 - 99 mg/dL   BUN 27 (H) 6 - 23 mg/dL   Creatinine, Ser 8.58 0.40 - 1.50 mg/dL   Total Bilirubin 0.6 0.2 - 1.2 mg/dL   Alkaline Phosphatase 61 39 - 117 U/L   AST 15 0 - 37 U/L   ALT 16 0 - 53 U/L   Total Protein 6.4 6.0 - 8.3 g/dL   Albumin  4.4 3.5 - 5.2 g/dL   GFR 52.37 (L) >39.99 mL/min   Calcium  9.3 8.4 - 10.5 mg/dL  Lipid panel   Collection Time: 06/09/24  8:10 AM  Result Value Ref Range   Cholesterol 88 0 - 200 mg/dL   Triglycerides 876.9 0.0 - 149.0 mg/dL   HDL 65.89 (L) >60.99 mg/dL   VLDL 75.3 0.0 - 59.9 mg/dL   LDL Cholesterol 30 0 - 99 mg/dL   Total CHOL/HDL Ratio 3    NonHDL 54.21   Microalbumin / creatinine urine ratio   Collection Time: 06/09/24  8:10 AM  Result Value Ref Range   Microalb, Ur 8.4 (H) 0.0 - 1.9 mg/dL   Creatinine,U 18.6 mg/dL   Microalb Creat Ratio 103.0 (H) 0.0 - 30.0 mg/g  Hemoglobin A1c   Collection Time: 06/09/24  8:10 AM  Result Value Ref Range    Hgb A1c MFr Bld 8.4 (H) 4.6 - 6.5 %    Assessment & Plan:   Problem List Items Addressed This Visit     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  Continue current regimen, increase amaryl  to 4mg   daily with breakfast. Reassess control at 6 mo DM f/u visit. Pt declines sooner f/u      Relevant Medications   metFORMIN  (GLUCOPHAGE ) 1000 MG tablet   Semaglutide , 1 MG/DOSE, 4 MG/3ML SOPN   glimepiride  (AMARYL ) 4 MG tablet   OSA on CPAP (Chronic)   [facility]       Hyperlipidemia associated with type 2 diabetes mellitus (HCC), target LDL <70 (Chronic)   Chronic, stable on current regimen - continue. The ASCVD Risk score (Arnett DK, et al., 2019) failed to calculate for the following reasons:   Risk score cannot be calculated because patient has a medical history suggesting prior/existing ASCVD       Relevant Medications   metFORMIN  (GLUCOPHAGE ) 1000 MG tablet   Semaglutide , 1 MG/DOSE, 4 MG/3ML SOPN   nitroGLYCERIN  (NITROSTAT ) 0.4 MG SL tablet   furosemide  (LASIX ) 20 MG tablet   omega-3 acid ethyl esters (LOVAZA ) 1 g capsule   glimepiride  (AMARYL ) 4 MG tablet   Essential hypertension (Chronic)   Chronic, stable. Continue current regimen.       Relevant Medications   nitroGLYCERIN  (NITROSTAT ) 0.4 MG SL tablet   furosemide  (LASIX ) 20 MG tablet   omega-3 acid ethyl esters (LOVAZA ) 1 g capsule   Severe obesity (BMI 35.0-39.9) with comorbidity (HCC) (Chronic)   Discussed weight gain noted, encouraged healthy diet and lifestyle choices to affect sustainable weight loss. Continue ozempic  1mg  weekly.       Relevant Medications   metFORMIN  (GLUCOPHAGE ) 1000 MG tablet   Semaglutide , 1 MG/DOSE, 4 MG/3ML SOPN   glimepiride  (AMARYL ) 4 MG tablet   Coronary artery disease involving native coronary artery of native heart with unstable angina pectoris (HCC) (Chronic)    Relevant Medications   nitroGLYCERIN  (NITROSTAT ) 0.4 MG SL tablet   furosemide  (LASIX ) 20 MG tablet   omega-3 acid ethyl esters (LOVAZA ) 1 g capsule   Health maintenance examination - Primary (Chronic)   Preventative protocols reviewed and updated unless pt declined. Discussed healthy diet and lifestyle.       Advanced care planning/counseling discussion (Chronic)   Advanced directive discussion - does not want prolonged life support. Would want wife to be HCPOA. Packet previously provided, again today.       COPD (chronic obstructive pulmonary disease) (HCC) (Chronic)   Chronic, stable off respiratory medication      Relevant Medications   cetirizine  (ZYRTEC ) 10 MG tablet   fluticasone  (FLONASE ) 50 MCG/ACT nasal spray   Thrombocytopenia (HCC)   Progressive. On plavix . Notes easy bruising. LFTs normal. Continue to monitor.       Ex-smoker   Congratulated on smoking cessation.  He notes increased appetite since quitting.       Primary open angle glaucoma (POAG) of both eyes, moderate stage   Relevant Medications   cycloSPORINE (RESTASIS) 0.05 % ophthalmic emulsion   Agent orange exposure   Type 2 diabetes mellitus with diabetic nephropathy, without long-term current use of insulin  (HCC)   Relevant Medications   metFORMIN  (GLUCOPHAGE ) 1000 MG tablet   Semaglutide , 1 MG/DOSE, 4 MG/3ML SOPN   glimepiride  (AMARYL ) 4 MG tablet   Abdominal wall hernia   Presence of drug-eluting stent in right coronary artery   Ischemic cardiomyopathy   Relevant Medications   nitroGLYCERIN  (NITROSTAT ) 0.4 MG SL tablet   furosemide  (LASIX ) 20 MG tablet   omega-3 acid ethyl esters (LOVAZA ) 1 g capsule   Vitamin B12 deficiency   Levels now too high - hold B12 for 2  wks then drop to once weekly dosing.         Meds ordered this encounter  Medications   cetirizine  (ZYRTEC ) 10 MG tablet    Sig: Take 1 tablet (10 mg total) by mouth daily.    Dispense:  90 tablet    Refill:  3   clopidogrel   (PLAVIX ) 75 MG tablet    Sig: Take 1 tablet (75 mg total) by mouth daily.    Dispense:  90 tablet    Refill:  3   fluticasone  (FLONASE ) 50 MCG/ACT nasal spray    Sig: Place 2 sprays into both nostrils daily.    Dispense:  16 g    Refill:  1   DISCONTD: glimepiride  (AMARYL ) 2 MG tablet    Sig: Take 1 tablet (2 mg total) by mouth daily before breakfast.    Dispense:  90 tablet    Refill:  4   metFORMIN  (GLUCOPHAGE ) 1000 MG tablet    Sig: Take 1 tablet (1,000 mg total) by mouth 2 (two) times daily with a meal.    Dispense:  180 tablet    Refill:  4   Semaglutide , 1 MG/DOSE, 4 MG/3ML SOPN    Sig: Inject 1 mg as directed once a week.    Dispense:  9 mL    Refill:  3   nitroGLYCERIN  (NITROSTAT ) 0.4 MG SL tablet    Sig: For chest pain, tightness, or pressure. While sitting, place 1 tablet under tongue. May be used every 5 minutes as needed, for up to 15 minutes. Do not use more than 3 tablets.    Dispense:  25 tablet    Refill:  3   omega-3 acid ethyl esters (LOVAZA ) 1 g capsule    Sig: Take 2 capsules (2 g total) by mouth 2 (two) times daily.    Dispense:  360 capsule    Refill:  3   glimepiride  (AMARYL ) 4 MG tablet    Sig: Take 1 tablet (4 mg total) by mouth daily before breakfast.    Dispense:  90 tablet    Refill:  3    Use higher dose 4mg    cyanocobalamin  (VITAMIN B12) 1000 MCG tablet    Sig: Take 1 tablet (1,000 mcg total) by mouth once a week.    No orders of the defined types were placed in this encounter.   Patient Instructions  Restart allergy medicines for nasal congestion  Cut down on snacking, choose healthier snacks.  Double check that you've had prevnar-20 at the [facility] (pneumonia shot)  Increase glimepiride  (amaryl ) to 4mg  daily. Hold b12 for 2 weeks then start once weekly dosing.  Advanced directive packet provided today.  Good to see you today  Return as needed or in 6 months for diabetes follow up visit.   Follow up plan: Return in about 6 months (around  12/17/2024) for follow up visit.  Anton Blas, MD

## 2024-06-16 NOTE — Assessment & Plan Note (Signed)
Chronic, stable off respiratory medication. 

## 2024-06-16 NOTE — Assessment & Plan Note (Signed)
 Chronic, stable. Continue current regimen.

## 2024-06-16 NOTE — Assessment & Plan Note (Signed)
 Progressive. On plavix . Notes easy bruising. LFTs normal. Continue to monitor.

## 2024-06-17 ENCOUNTER — Other Ambulatory Visit (HOSPITAL_COMMUNITY): Payer: Self-pay

## 2024-06-17 ENCOUNTER — Telehealth: Payer: Self-pay

## 2024-06-17 NOTE — Telephone Encounter (Signed)
 Pharmacy Patient Advocate Encounter   Received notification from CoverMyMeds that prior authorization for Ozempic  (1 MG/DOSE) 4MG /3ML pen-injectors is required/requested.   Insurance verification completed.   The patient is insured through [facility] [employer] ADVANTAGE/RX ADVANCE .   Per test claim: PA required; PA submitted to above mentioned insurance via Latent Key/confirmation #/EOC [id number] Status is pending

## 2024-06-18 NOTE — Telephone Encounter (Signed)
 Pharmacy Patient Advocate Encounter  Received notification from [employer]/RX ADVANCE that Prior Authorization for Ozempic  (1 MG/DOSE) 4MG /3ML pen-injectors   has been APPROVED from 06/18/2024 to 06/18/2025 OZEMPIC  1MG /DOSE) 4MG /3ML QUANTITY 9   PA #/Case ID/Reference #: [id number]

## 2024-06-18 NOTE — Telephone Encounter (Signed)
 Called patient wanted to let him know approved. Patient states gets from patient assistance. Let him know nothing further is needed and this will not effect delivery.

## 2024-06-20 ENCOUNTER — Other Ambulatory Visit (HOSPITAL_COMMUNITY): Payer: Self-pay

## 2024-07-23 ENCOUNTER — Encounter (HOSPITAL_COMMUNITY): Payer: Self-pay | Admitting: Physician Assistant

## 2024-07-23 ENCOUNTER — Other Ambulatory Visit (HOSPITAL_COMMUNITY): Payer: Self-pay | Admitting: Physician Assistant

## 2024-07-23 DIAGNOSIS — Z87891 Personal history of nicotine dependence: Secondary | ICD-10-CM

## 2024-07-28 ENCOUNTER — Other Ambulatory Visit: Payer: Self-pay | Admitting: Cardiology

## 2024-07-29 ENCOUNTER — Other Ambulatory Visit: Payer: Self-pay | Admitting: General Practice

## 2024-07-30 MED ORDER — ATORVASTATIN CALCIUM 80 MG PO TABS: 80.0000 mg | ORAL_TABLET | Freq: Every day | ORAL | 2 refills | Status: AC

## 2024-08-05 ENCOUNTER — Ambulatory Visit (HOSPITAL_COMMUNITY)
Admission: RE | Admit: 2024-08-05 | Discharge: 2024-08-05 | Disposition: A | Discharge status: Home / Self Care | Source: Ambulatory Visit | Attending: Physician Assistant | Admitting: Physician Assistant

## 2024-08-05 ENCOUNTER — Other Ambulatory Visit: Payer: Self-pay

## 2024-08-05 DIAGNOSIS — Z87891 Personal history of nicotine dependence: Secondary | ICD-10-CM | POA: Diagnosis present

## 2024-08-07 MED ORDER — METOPROLOL SUCCINATE ER 25 MG PO TB24: 12.5000 mg | ORAL_TABLET | Freq: Every day | ORAL | 2 refills | Status: AC

## 2024-09-29 ENCOUNTER — Encounter: Payer: Self-pay | Admitting: Cardiovascular Disease

## 2024-09-29 ENCOUNTER — Ambulatory Visit: Attending: Cardiovascular Disease | Admitting: Cardiovascular Disease

## 2024-09-29 VITALS — BP 120/58 | HR 66 | Ht 68.0 in | Wt 242.4 lb

## 2024-09-29 DIAGNOSIS — I25110 Atherosclerotic heart disease of native coronary artery with unstable angina pectoris: Secondary | ICD-10-CM | POA: Diagnosis not present

## 2024-09-29 DIAGNOSIS — I10 Essential (primary) hypertension: Secondary | ICD-10-CM

## 2024-09-29 DIAGNOSIS — G4733 Obstructive sleep apnea (adult) (pediatric): Secondary | ICD-10-CM

## 2024-09-29 MED ORDER — FUROSEMIDE 20 MG PO TABS: 10.0000 mg | ORAL_TABLET | Freq: Every day | ORAL | 3 refills | Status: AC | PRN

## 2024-09-29 NOTE — Patient Instructions (Signed)
 Medication Instructions:  Take Furosemide  10 mg daily only As Needed *If you need a refill on your cardiac medications before your next appointment, please call your pharmacy*  Lab Work: None ordered If you have labs (blood work) drawn today and your tests are completely normal, you will receive your results only by: MyChart Message (if you have MyChart) OR A paper copy in the mail If you have any lab test that is abnormal or we need to change your treatment, we will call you to review the results.  Testing/Procedures: None ordered  Follow-Up: At [facility], you and your health needs are our priority.  As part of our continuing mission to provide you with exceptional heart care, our providers are all part of one team.  This team includes your primary Cardiologist (physician) and Advanced Practice Providers or APPs (Physician Assistants and Nurse Practitioners) who all work together to provide you with the care you need, when you need it.  Your next appointment:   1 year(s)  Provider:   Jerel Balding, MD    We recommend signing up for the patient portal called MyChart.  Sign up information is provided on this After Visit Summary.  MyChart is used to connect with patients for Virtual Visits (Telemedicine).  Patients are able to view lab/test results, encounter notes, upcoming appointments, etc.  Non-urgent messages can be sent to your provider as well.   To learn more about what you can do with MyChart, go to [email or web address].

## 2024-09-29 NOTE — Progress Notes (Signed)
 Cardiology Office Note   Date:  09/29/2024  ID:  Shaan, Rhoads 1945-10-27, MRN [id number] PCP: Rilla Baller, MD  [facility] Providers Cardiologist:  Debby Sor, MD (Inactive)     History of Present Illness Evan Avery is a 78 y.o. 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  He is here to transition cardiology care from Dr. Sor.  He has been feeling very well. The patient specifically denies any chest pain at rest or with exertion, dyspnea at rest or with exertion, orthopnea, paroxysmal nocturnal dyspnea, syncope, palpitations, focal neurological deficits, intermittent claudication, lower extremity edema, unexplained weight gain, cough, hemoptysis or wheezing.  Describes previous angina as a sensation of indigestion.   Studies Reviewed EKG Interpretation Date/Time:  Monday September 29 2024 13:27:34 EST Ventricular Rate:  66 PR Interval:  206 QRS Duration:  90 QT Interval:  414 QTC Calculation: 434 R Axis:   -7  Text Interpretation: Normal sinus rhythm Possible Inferior infarct , age undetermined When compared with ECG of 16-Apr-2024 10:41, Premature supraventricular complexes are no longer Present Nonspecific T wave abnormality, worse in Lateral leads Confirmed by Lezlie Ritchey [phone]) on 09/29/2024 1:29:03 PM    Lexiscan  Myoview  05/11/2020 Study Highlights     The left ventricular ejection fraction is moderately decreased (30-44%). Nuclear stress EF: 43%. No T wave inversion was noted during stress. There was no ST segment deviation noted during stress. Defect 1: There is a small defect of mild severity present in the mid inferior location. Findings  consistent with prior myocardial infarction. This is an intermediate risk study.   Small size, mild severity fixed mid inferior perfusion defect, likely scar. LVEF 43% with basal to mid inferior hypokinesis. This is an intermediate risk study. Compared to a prior study in 2019, the LVEF is calculated lower, but appears visually higher - the perfusion defect appears unchanged.     CATH/PCI: 08/06/2022 CONCLUSIONS: Cardiac arrest in the setting of ACS identified that the mid RCA above a previously placed stent is 99% stenosis with TIMI grade II flow and recruited left-to-right collaterals distally.  The vessel is heavily calcified and the entire proximal segment is segmentally 50% narrowed. The culprit lesion was dilated and then stented with a 28 x 3.5 mm Synergy postdilated using high-pressure with a 3.75 x 22 Pierron balloon to 17 atm x 2.  TIMI grade III flow established.  0% stenosis was noted at the site of the 99% stenosis but persistent 50% underexpansion was noted proximal to the culprit. Left main is widely patent LAD is widely patent Circumflex is widely patent Inferior wall to the apex is severely hypokinetic to akinetic.  EDP is 17.  EF is 30 to 35%.           Intervention           ECHO: 02/27/2023  1. Left ventricular ejection fraction, by estimation, is 60 to 65%. The  left ventricle has normal function. The left ventricle demonstrates  regional wall motion abnormalities (see scoring diagram/findings for  description). There is mild concentric left  ventricular hypertrophy. Left ventricular diastolic parameters are  indeterminate. There is mild hypokinesis of the left ventricular, basal  inferior wall  and inferolateral wall.   2. Right ventricular systolic function is normal. The right ventricular  size is normal.   3. The mitral valve is grossly normal. Trivial mitral valve  regurgitation. No evidence of mitral stenosis.   4. The aortic valve is tricuspid. There is mild  calcification of the  aortic valve. There is mild thickening of the aortic valve. Aortic valve  regurgitation is not visualized. Aortic valve sclerosis is present, with  no evidence of aortic valve stenosis.   Comparison(s): Changes from prior study are noted. EF improved compared to  prior.    Risk Assessment/Calculations           Physical Exam VS:  BP (!) 120/58 (BP Location: Left Arm, Patient Position: Sitting)   Pulse 66   Ht 5' 8 (1.727 m)   Wt 242 lb 6.4 oz (110 kg)   SpO2 98%   BMI 36.86 kg/m        Wt Readings from Last 3 Encounters:  09/29/24 242 lb 6.4 oz (110 kg)  06/16/24 233 lb 2 oz (105.7 kg)  04/16/24 224 lb 9.6 oz (101.9 kg)    GEN: Well nourished, well developed in no acute distress.  Moderately obese NECK: No JVD; No carotid bruits CARDIAC: RRR, no murmurs, rubs, gallops RESPIRATORY:  Clear to auscultation without rales, wheezing or rhonchi  ABDOMEN: Soft, non-tender, non-distended EXTREMITIES:  No edema; No deformity   ASSESSMENT AND PLAN CAD: Multiple previous revascularization procedures of the right coronary artery including the most recent 1 in the setting of cardiac arrest, but would have minimal injury to the left ventricular myocardium and preserved overall LV function.  Asymptomatic.  On high-dose atorvastatin , antiplatelet therapy and low-dose beta-blockers. History of VF arrest: In the setting of acute myocardial infarction, now with recovered LV function. HFpEF: Recovered LV function after his last myocardial infarction complicated by cardiac arrest.  On beta-blocker, sacubitril /valsartan , empagliflozin .  Not on spironolactone, but his blood pressure would not allow that to be added.  NYHA functional class I, clinically euvolemic on a very tiny dose of loop diuretic.  I think he can take that medication as needed only. DM2: Glycemic control is not perfect with a hemoglobin A1c of 8.4%.  Insulin  resistance is also reflected in his very low HDL  cholesterol of 34.  Encouraged more physical activity, reduce intake of sugars and starches with high glycemic index.  He is on empagliflozin  and semaglutide  and metformin , an optimal choice of agents for control of diabetes mellitus in a patient with cardiovascular disease. HLP: On maximum dose atorvastatin  plus ezetimibe .  LDL is well within target range, which should be less than 55.  HDL will improve only with more physical activity and weight loss. HTN: Very well-controlled, in fact with a rather low diastolic blood pressure.  He is only on a tiny dose of beta-blocker and Entresto . CKD3A: Most recent creatinine 1.41, GFR just under 50, probably close to his baseline (although in 2023, he had a couple of normal creatinines at 1.1, he also had values in the low 2s in October, following his VF arrest). OSA: Reports compliance with CPAP.  Recommend enrolling in Dr. Wilbert Turner's sleep clinic. Obesity: on semaglutide . Multiple comorbid confditions.       Dispo:  Patient Instructions  Medication Instructions:  Take Furosemide  10 mg daily only As Needed *If you need a refill on your cardiac medications before your next appointment, please call your pharmacy*  Lab Work: None ordered If you have labs (  blood work) drawn today and your tests are completely normal, you will receive your results only by: MyChart Message (if you have MyChart) OR A paper copy in the mail If you have any lab test that is abnormal or we need to change your treatment, we will call you to review the results.  Testing/Procedures: None ordered  Follow-Up: At [facility], you and your health needs are our priority.  As part of our continuing mission to provide you with exceptional heart care, our providers are all part of one team.  This team includes your primary Cardiologist (physician) and Advanced Practice Providers or APPs (Physician Assistants and Nurse Practitioners) who all work together to provide you  with the care you need, when you need it.  Your next appointment:   1 year(s)  Provider:   Jerel Balding, MD    We recommend signing up for the patient portal called MyChart.  Sign up information is provided on this After Visit Summary.  MyChart is used to connect with patients for Virtual Visits (Telemedicine).  Patients are able to view lab/test results, encounter notes, upcoming appointments, etc.  Non-urgent messages can be sent to your provider as well.   To learn more about what you can do with MyChart, go to [email or web address].      Signed, Jerel Balding, MD

## 2024-10-05 DIAGNOSIS — Z8679 Personal history of other diseases of the circulatory system: Secondary | ICD-10-CM | POA: Insufficient documentation

## 2024-10-05 DIAGNOSIS — I5032 Chronic diastolic (congestive) heart failure: Secondary | ICD-10-CM | POA: Insufficient documentation

## 2024-10-05 DIAGNOSIS — N1831 Chronic kidney disease, stage 3a: Secondary | ICD-10-CM | POA: Insufficient documentation

## 2024-10-05 DIAGNOSIS — E785 Hyperlipidemia, unspecified: Secondary | ICD-10-CM | POA: Insufficient documentation

## 2024-12-17 ENCOUNTER — Ambulatory Visit: Admitting: Family Medicine

## 2025-02-12 ENCOUNTER — Ambulatory Visit

## 2025-02-13 ENCOUNTER — Ambulatory Visit
